# Patient Record
Sex: Female | Born: 1945 | Race: White | Hispanic: No | Marital: Married | Smoking: Never smoker
Health system: Southern US, Community
[De-identification: ages and names within clinical notes are randomized; demographics above are authoritative.]

## PROBLEM LIST (undated history)

## (undated) DIAGNOSIS — C439 Malignant melanoma of skin, unspecified: Secondary | ICD-10-CM

## (undated) DIAGNOSIS — J90 Pleural effusion, not elsewhere classified: Secondary | ICD-10-CM

## (undated) DIAGNOSIS — C541 Malignant neoplasm of endometrium: Secondary | ICD-10-CM

## (undated) DIAGNOSIS — E611 Iron deficiency: Secondary | ICD-10-CM

## (undated) DIAGNOSIS — C50919 Malignant neoplasm of unspecified site of unspecified female breast: Secondary | ICD-10-CM

## (undated) DIAGNOSIS — Z803 Family history of malignant neoplasm of breast: Secondary | ICD-10-CM

## (undated) DIAGNOSIS — Z923 Personal history of irradiation: Secondary | ICD-10-CM

## (undated) DIAGNOSIS — Z808 Family history of malignant neoplasm of other organs or systems: Secondary | ICD-10-CM

## (undated) DIAGNOSIS — M1990 Unspecified osteoarthritis, unspecified site: Secondary | ICD-10-CM

## (undated) DIAGNOSIS — C569 Malignant neoplasm of unspecified ovary: Secondary | ICD-10-CM

## (undated) DIAGNOSIS — Z800 Family history of malignant neoplasm of digestive organs: Secondary | ICD-10-CM

## (undated) DIAGNOSIS — E785 Hyperlipidemia, unspecified: Secondary | ICD-10-CM

## (undated) HISTORY — DX: Malignant neoplasm of unspecified site of unspecified female breast: C50.919

## (undated) HISTORY — DX: Pleural effusion, not elsewhere classified: J90

## (undated) HISTORY — DX: Iron deficiency: E61.1

## (undated) HISTORY — DX: Unspecified osteoarthritis, unspecified site: M19.90

## (undated) HISTORY — DX: Hyperlipidemia, unspecified: E78.5

## (undated) HISTORY — DX: Malignant neoplasm of endometrium: C54.1

## (undated) HISTORY — DX: Family history of malignant neoplasm of other organs or systems: Z80.8

## (undated) HISTORY — PX: CATARACT EXTRACTION: SUR2

## (undated) HISTORY — DX: Personal history of irradiation: Z92.3

## (undated) HISTORY — DX: Malignant neoplasm of unspecified ovary: C56.9

## (undated) HISTORY — DX: Family history of malignant neoplasm of digestive organs: Z80.0

## (undated) HISTORY — PX: APPENDECTOMY: SHX54

## (undated) HISTORY — DX: Family history of malignant neoplasm of breast: Z80.3

## (undated) HISTORY — PX: OTHER SURGICAL HISTORY: SHX169

---

## 1995-05-17 HISTORY — PX: MASTECTOMY: SHX3

## 1998-05-09 ENCOUNTER — Emergency Department (HOSPITAL_COMMUNITY): Admission: EM | Admit: 1998-05-09 | Discharge: 1998-05-09 | Payer: Self-pay | Admitting: Emergency Medicine

## 1999-12-22 ENCOUNTER — Ambulatory Visit (HOSPITAL_COMMUNITY): Admission: RE | Admit: 1999-12-22 | Discharge: 1999-12-22 | Payer: Self-pay | Admitting: Family Medicine

## 1999-12-22 ENCOUNTER — Encounter: Payer: Self-pay | Admitting: Family Medicine

## 2000-04-10 ENCOUNTER — Encounter: Admission: RE | Admit: 2000-04-10 | Discharge: 2000-04-10 | Payer: Self-pay | Admitting: General Surgery

## 2000-04-10 ENCOUNTER — Encounter: Payer: Self-pay | Admitting: General Surgery

## 2000-05-01 ENCOUNTER — Encounter: Admission: RE | Admit: 2000-05-01 | Discharge: 2000-05-01 | Payer: Self-pay | Admitting: General Surgery

## 2000-05-01 ENCOUNTER — Encounter: Payer: Self-pay | Admitting: General Surgery

## 2000-05-15 ENCOUNTER — Encounter: Payer: Self-pay | Admitting: General Surgery

## 2000-05-15 ENCOUNTER — Encounter: Admission: RE | Admit: 2000-05-15 | Discharge: 2000-05-15 | Payer: Self-pay | Admitting: General Surgery

## 2008-05-16 DIAGNOSIS — C439 Malignant melanoma of skin, unspecified: Secondary | ICD-10-CM

## 2008-05-16 HISTORY — DX: Malignant melanoma of skin, unspecified: C43.9

## 2008-10-07 ENCOUNTER — Emergency Department (HOSPITAL_COMMUNITY): Admission: EM | Admit: 2008-10-07 | Discharge: 2008-10-07 | Payer: Self-pay | Admitting: Emergency Medicine

## 2008-10-07 ENCOUNTER — Emergency Department (HOSPITAL_COMMUNITY): Admission: EM | Admit: 2008-10-07 | Discharge: 2008-10-07 | Payer: Self-pay | Admitting: Family Medicine

## 2008-10-10 ENCOUNTER — Ambulatory Visit: Admission: RE | Admit: 2008-10-10 | Discharge: 2008-10-10 | Payer: Self-pay | Admitting: Gynecology

## 2008-10-10 ENCOUNTER — Encounter (INDEPENDENT_AMBULATORY_CARE_PROVIDER_SITE_OTHER): Payer: Self-pay | Admitting: Interventional Radiology

## 2008-10-10 ENCOUNTER — Encounter: Payer: Self-pay | Admitting: Gynecology

## 2008-10-10 ENCOUNTER — Ambulatory Visit (HOSPITAL_COMMUNITY): Admission: RE | Admit: 2008-10-10 | Discharge: 2008-10-10 | Payer: Self-pay | Admitting: Gynecology

## 2008-10-14 ENCOUNTER — Inpatient Hospital Stay (HOSPITAL_COMMUNITY): Admission: RE | Admit: 2008-10-14 | Discharge: 2008-10-17 | Payer: Self-pay | Admitting: Obstetrics and Gynecology

## 2008-10-14 ENCOUNTER — Encounter: Payer: Self-pay | Admitting: Gynecologic Oncology

## 2008-10-14 ENCOUNTER — Encounter (INDEPENDENT_AMBULATORY_CARE_PROVIDER_SITE_OTHER): Payer: Self-pay | Admitting: Interventional Radiology

## 2008-10-14 DIAGNOSIS — C541 Malignant neoplasm of endometrium: Secondary | ICD-10-CM

## 2008-10-14 HISTORY — PX: ABDOMINAL HYSTERECTOMY: SHX81

## 2008-10-14 HISTORY — DX: Malignant neoplasm of endometrium: C54.1

## 2008-10-17 ENCOUNTER — Ambulatory Visit: Payer: Self-pay | Admitting: Oncology

## 2008-10-29 ENCOUNTER — Ambulatory Visit: Admission: RE | Admit: 2008-10-29 | Discharge: 2008-10-29 | Payer: Self-pay | Admitting: Gynecology

## 2008-11-12 LAB — CMP (CANCER CENTER ONLY)
ALT(SGPT): 22 U/L (ref 10–47)
AST: 20 U/L (ref 11–38)
Albumin: 3.2 g/dL — ABNORMAL LOW (ref 3.3–5.5)
Alkaline Phosphatase: 58 U/L (ref 26–84)
BUN, Bld: 11 mg/dL (ref 7–22)
CO2: 29 mEq/L (ref 18–33)
Calcium: 9.2 mg/dL (ref 8.0–10.3)
Chloride: 98 mEq/L (ref 98–108)
Creat: 0.6 mg/dl (ref 0.6–1.2)
Glucose, Bld: 139 mg/dL — ABNORMAL HIGH (ref 73–118)
Potassium: 4.4 mEq/L (ref 3.3–4.7)
Sodium: 145 mEq/L (ref 128–145)
Total Bilirubin: 0.7 mg/dl (ref 0.20–1.60)
Total Protein: 7 g/dL (ref 6.4–8.1)

## 2008-11-12 LAB — CBC WITH DIFFERENTIAL (CANCER CENTER ONLY)
BASO#: 0 10*3/uL (ref 0.0–0.2)
BASO%: 0.4 % (ref 0.0–2.0)
EOS%: 3.8 % (ref 0.0–7.0)
Eosinophils Absolute: 0.3 10*3/uL (ref 0.0–0.5)
HCT: 33 % — ABNORMAL LOW (ref 34.8–46.6)
HGB: 11.1 g/dL — ABNORMAL LOW (ref 11.6–15.9)
LYMPH#: 1.8 10*3/uL (ref 0.9–3.3)
LYMPH%: 27.6 % (ref 14.0–48.0)
MCH: 24.9 pg — ABNORMAL LOW (ref 26.0–34.0)
MCHC: 33.5 g/dL (ref 32.0–36.0)
MCV: 74 fL — ABNORMAL LOW (ref 81–101)
MONO#: 0.4 10*3/uL (ref 0.1–0.9)
MONO%: 6.5 % (ref 0.0–13.0)
NEUT#: 4.1 10*3/uL (ref 1.5–6.5)
NEUT%: 61.7 % (ref 39.6–80.0)
Platelets: 294 10*3/uL (ref 145–400)
RBC: 4.44 10*6/uL (ref 3.70–5.32)
RDW: 15.1 % — ABNORMAL HIGH (ref 10.5–14.6)
WBC: 6.6 10*3/uL (ref 3.9–10.0)

## 2008-11-13 LAB — FOLATE: Folate: 8.9 ng/mL

## 2008-11-13 LAB — CA 125: CA 125: 42 U/mL — ABNORMAL HIGH (ref 0.0–30.2)

## 2008-11-13 LAB — IRON AND TIBC
%SAT: 6 % — ABNORMAL LOW (ref 20–55)
Iron: 24 ug/dL — ABNORMAL LOW (ref 42–145)
TIBC: 371 ug/dL (ref 250–470)
UIBC: 347 ug/dL

## 2008-11-13 LAB — CEA: CEA: 0.5 ng/mL (ref 0.0–5.0)

## 2008-11-13 LAB — LACTATE DEHYDROGENASE: LDH: 133 U/L (ref 94–250)

## 2008-11-13 LAB — VITAMIN B12: Vitamin B-12: 350 pg/mL (ref 211–911)

## 2008-11-13 LAB — FERRITIN: Ferritin: 21 ng/mL (ref 10–291)

## 2008-11-20 ENCOUNTER — Ambulatory Visit: Admission: RE | Admit: 2008-11-20 | Discharge: 2008-11-20 | Payer: Self-pay | Admitting: Gynecologic Oncology

## 2008-11-21 ENCOUNTER — Ambulatory Visit: Payer: Self-pay | Admitting: Genetic Counselor

## 2008-11-26 ENCOUNTER — Ambulatory Visit (HOSPITAL_COMMUNITY): Admission: RE | Admit: 2008-11-26 | Discharge: 2008-11-26 | Payer: Self-pay | Admitting: Oncology

## 2008-12-01 ENCOUNTER — Ambulatory Visit: Payer: Self-pay | Admitting: Oncology

## 2008-12-02 LAB — CBC WITH DIFFERENTIAL (CANCER CENTER ONLY)
BASO#: 0 10*3/uL (ref 0.0–0.2)
BASO%: 0.4 % (ref 0.0–2.0)
EOS%: 0.9 % (ref 0.0–7.0)
Eosinophils Absolute: 0.1 10*3/uL (ref 0.0–0.5)
HCT: 38.7 % (ref 34.8–46.6)
HGB: 13.2 g/dL (ref 11.6–15.9)
LYMPH#: 0.8 10*3/uL — ABNORMAL LOW (ref 0.9–3.3)
LYMPH%: 10.2 % — ABNORMAL LOW (ref 14.0–48.0)
MCH: 25.4 pg — ABNORMAL LOW (ref 26.0–34.0)
MCHC: 34 g/dL (ref 32.0–36.0)
MCV: 75 fL — ABNORMAL LOW (ref 81–101)
MONO#: 0.1 10*3/uL (ref 0.1–0.9)
MONO%: 0.9 % (ref 0.0–13.0)
NEUT#: 7.2 10*3/uL — ABNORMAL HIGH (ref 1.5–6.5)
NEUT%: 87.6 % — ABNORMAL HIGH (ref 39.6–80.0)
Platelets: 247 10*3/uL (ref 145–400)
RBC: 5.17 10*6/uL (ref 3.70–5.32)
RDW: 16.1 % — ABNORMAL HIGH (ref 10.5–14.6)
WBC: 8.2 10*3/uL (ref 3.9–10.0)

## 2008-12-02 LAB — BASIC METABOLIC PANEL - CANCER CENTER ONLY
BUN, Bld: 13 mg/dL (ref 7–22)
CO2: 29 mEq/L (ref 18–33)
Calcium: 9.7 mg/dL (ref 8.0–10.3)
Chloride: 100 mEq/L (ref 98–108)
Creat: 0.6 mg/dl (ref 0.6–1.2)
Glucose, Bld: 306 mg/dL — ABNORMAL HIGH (ref 73–118)
Potassium: 5 mEq/L — ABNORMAL HIGH (ref 3.3–4.7)
Sodium: 142 mEq/L (ref 128–145)

## 2008-12-09 LAB — MANUAL DIFFERENTIAL (CHCC SATELLITE)
ALC: 1.8 10*3/uL (ref 0.6–2.2)
ANC (CHCC HP manual diff): 2.1 10*3/uL (ref 1.5–6.7)
BASO: 1 % (ref 0–2)
Band Neutrophils: 13 % — ABNORMAL HIGH (ref 0–10)
Eos: 4 % (ref 0–7)
LYMPH: 34 % (ref 14–48)
MONO: 18 % — ABNORMAL HIGH (ref 0–13)
PLT EST ~~LOC~~: ADEQUATE
Platelet Morphology: NORMAL
SEG: 29 % — ABNORMAL LOW (ref 40–75)
Variant Lymph: 1 % — ABNORMAL HIGH (ref 0–0)

## 2008-12-09 LAB — BASIC METABOLIC PANEL
BUN: 14 mg/dL (ref 6–23)
CO2: 28 mEq/L (ref 19–32)
Calcium: 9.9 mg/dL (ref 8.4–10.5)
Chloride: 101 mEq/L (ref 96–112)
Creatinine, Ser: 0.65 mg/dL (ref 0.40–1.20)
Glucose, Bld: 121 mg/dL — ABNORMAL HIGH (ref 70–99)
Potassium: 4.5 mEq/L (ref 3.5–5.3)
Sodium: 140 mEq/L (ref 135–145)

## 2008-12-09 LAB — CBC WITH DIFFERENTIAL (CANCER CENTER ONLY)
HCT: 37.5 % (ref 34.8–46.6)
HGB: 12.6 g/dL (ref 11.6–15.9)
MCH: 25.2 pg — ABNORMAL LOW (ref 26.0–34.0)
MCHC: 33.6 g/dL (ref 32.0–36.0)
MCV: 75 fL — ABNORMAL LOW (ref 81–101)
Platelets: 193 10*3/uL (ref 145–400)
RBC: 4.99 10*6/uL (ref 3.70–5.32)
RDW: 16.4 % — ABNORMAL HIGH (ref 10.5–14.6)
WBC: 5.1 10*3/uL (ref 3.9–10.0)

## 2008-12-09 LAB — TECHNOLOGIST REVIEW CHCC SATELLITE

## 2008-12-23 LAB — CMP (CANCER CENTER ONLY)
ALT(SGPT): 20 U/L (ref 10–47)
AST: 25 U/L (ref 11–38)
Albumin: 3.2 g/dL — ABNORMAL LOW (ref 3.3–5.5)
Alkaline Phosphatase: 64 U/L (ref 26–84)
BUN, Bld: 15 mg/dL (ref 7–22)
CO2: 29 mEq/L (ref 18–33)
Calcium: 8.8 mg/dL (ref 8.0–10.3)
Chloride: 99 mEq/L (ref 98–108)
Creat: 0.6 mg/dl (ref 0.6–1.2)
Glucose, Bld: 349 mg/dL — ABNORMAL HIGH (ref 73–118)
Potassium: 4.5 mEq/L (ref 3.3–4.7)
Sodium: 143 mEq/L (ref 128–145)
Total Bilirubin: 0.7 mg/dl (ref 0.20–1.60)
Total Protein: 7.1 g/dL (ref 6.4–8.1)

## 2008-12-23 LAB — CBC WITH DIFFERENTIAL (CANCER CENTER ONLY)
BASO#: 0.1 10*3/uL (ref 0.0–0.2)
BASO%: 0.5 % (ref 0.0–2.0)
EOS%: 1.2 % (ref 0.0–7.0)
Eosinophils Absolute: 0.2 10*3/uL (ref 0.0–0.5)
HCT: 37.2 % (ref 34.8–46.6)
HGB: 12.5 g/dL (ref 11.6–15.9)
LYMPH#: 0.8 10*3/uL — ABNORMAL LOW (ref 0.9–3.3)
LYMPH%: 4.9 % — ABNORMAL LOW (ref 14.0–48.0)
MCH: 25.8 pg — ABNORMAL LOW (ref 26.0–34.0)
MCHC: 33.7 g/dL (ref 32.0–36.0)
MCV: 76 fL — ABNORMAL LOW (ref 81–101)
MONO#: 0.2 10*3/uL (ref 0.1–0.9)
MONO%: 1.2 % (ref 0.0–13.0)
NEUT#: 14.5 10*3/uL — ABNORMAL HIGH (ref 1.5–6.5)
NEUT%: 92.2 % — ABNORMAL HIGH (ref 39.6–80.0)
Platelets: 208 10*3/uL (ref 145–400)
RBC: 4.87 10*6/uL (ref 3.70–5.32)
RDW: 17.2 % — ABNORMAL HIGH (ref 10.5–14.6)
WBC: 15.8 10*3/uL — ABNORMAL HIGH (ref 3.9–10.0)

## 2008-12-23 LAB — LIPID PANEL
Cholesterol: 153 mg/dL (ref 0–200)
HDL: 60 mg/dL (ref >39)
LDL Cholesterol: 75 mg/dL (ref 0–99)
Total CHOL/HDL Ratio: 2.6 Ratio
Triglycerides: 92 mg/dL (ref <150)
VLDL: 18 mg/dL (ref 0–40)

## 2008-12-26 ENCOUNTER — Ambulatory Visit: Payer: Self-pay | Admitting: Oncology

## 2008-12-30 LAB — CBC WITH DIFFERENTIAL (CANCER CENTER ONLY)
BASO#: 0 10*3/uL (ref 0.0–0.2)
BASO%: 0.5 % (ref 0.0–2.0)
EOS%: 1.4 % (ref 0.0–7.0)
Eosinophils Absolute: 0.1 10*3/uL (ref 0.0–0.5)
HCT: 35.3 % (ref 34.8–46.6)
HGB: 12 g/dL (ref 11.6–15.9)
LYMPH#: 1.2 10*3/uL (ref 0.9–3.3)
LYMPH%: 18.5 % (ref 14.0–48.0)
MCH: 25.9 pg — ABNORMAL LOW (ref 26.0–34.0)
MCHC: 33.9 g/dL (ref 32.0–36.0)
MCV: 76 fL — ABNORMAL LOW (ref 81–101)
MONO#: 0.3 10*3/uL (ref 0.1–0.9)
MONO%: 5.2 % (ref 0.0–13.0)
NEUT#: 4.9 10*3/uL (ref 1.5–6.5)
NEUT%: 74.4 % (ref 39.6–80.0)
Platelets: 152 10*3/uL (ref 145–400)
RBC: 4.62 10*6/uL (ref 3.70–5.32)
RDW: 17.3 % — ABNORMAL HIGH (ref 10.5–14.6)
WBC: 6.6 10*3/uL (ref 3.9–10.0)

## 2008-12-30 LAB — BASIC METABOLIC PANEL - CANCER CENTER ONLY
BUN, Bld: 12 mg/dL (ref 7–22)
CO2: 30 mEq/L (ref 18–33)
Calcium: 9.5 mg/dL (ref 8.0–10.3)
Chloride: 97 mEq/L — ABNORMAL LOW (ref 98–108)
Creat: 0.5 mg/dl — ABNORMAL LOW (ref 0.6–1.2)
Glucose, Bld: 177 mg/dL — ABNORMAL HIGH (ref 73–118)
Potassium: 4.7 mEq/L (ref 3.3–4.7)
Sodium: 143 mEq/L (ref 128–145)

## 2009-01-13 LAB — CBC WITH DIFFERENTIAL (CANCER CENTER ONLY)
BASO#: 0.1 10*3/uL (ref 0.0–0.2)
BASO%: 0.5 % (ref 0.0–2.0)
EOS%: 1.1 % (ref 0.0–7.0)
Eosinophils Absolute: 0.2 10*3/uL (ref 0.0–0.5)
HCT: 39.1 % (ref 34.8–46.6)
HGB: 12.7 g/dL (ref 11.6–15.9)
LYMPH#: 0.7 10*3/uL — ABNORMAL LOW (ref 0.9–3.3)
LYMPH%: 5.2 % — ABNORMAL LOW (ref 14.0–48.0)
MCH: 26.7 pg (ref 26.0–34.0)
MCHC: 32.4 g/dL (ref 32.0–36.0)
MCV: 82 fL (ref 81–101)
MONO#: 0.2 10*3/uL (ref 0.1–0.9)
MONO%: 1.2 % (ref 0.0–13.0)
NEUT#: 12.8 10*3/uL — ABNORMAL HIGH (ref 1.5–6.5)
NEUT%: 92 % — ABNORMAL HIGH (ref 39.6–80.0)
Platelets: 161 10*3/uL (ref 145–400)
RBC: 4.75 10*6/uL (ref 3.70–5.32)
RDW: 19.1 % — ABNORMAL HIGH (ref 10.5–14.6)
WBC: 13.9 10*3/uL — ABNORMAL HIGH (ref 3.9–10.0)

## 2009-01-13 LAB — CMP (CANCER CENTER ONLY)
ALT(SGPT): 29 U/L (ref 10–47)
AST: 26 U/L (ref 11–38)
Albumin: 3.5 g/dL (ref 3.3–5.5)
Alkaline Phosphatase: 83 U/L (ref 26–84)
BUN, Bld: 14 mg/dL (ref 7–22)
CO2: 28 mEq/L (ref 18–33)
Calcium: 9.1 mg/dL (ref 8.0–10.3)
Chloride: 97 mEq/L — ABNORMAL LOW (ref 98–108)
Creat: 0.6 mg/dl (ref 0.6–1.2)
Glucose, Bld: 396 mg/dL — ABNORMAL HIGH (ref 73–118)
Potassium: 4.6 mEq/L (ref 3.3–4.7)
Sodium: 142 mEq/L (ref 128–145)
Total Bilirubin: 0.8 mg/dl (ref 0.20–1.60)
Total Protein: 7.1 g/dL (ref 6.4–8.1)

## 2009-01-16 ENCOUNTER — Ambulatory Visit (HOSPITAL_COMMUNITY): Admission: RE | Admit: 2009-01-16 | Discharge: 2009-01-16 | Payer: Self-pay | Admitting: General Surgery

## 2009-01-21 LAB — MANUAL DIFFERENTIAL (CHCC SATELLITE)
ALC: 2.1 10*3/uL (ref 0.6–2.2)
ANC (CHCC HP manual diff): 2.9 10*3/uL (ref 1.5–6.7)
Band Neutrophils: 19 % — ABNORMAL HIGH (ref 0–10)
LYMPH: 36 % (ref 14–48)
MONO: 11 % (ref 0–13)
PLT EST ~~LOC~~: ADEQUATE
Platelet Morphology: NORMAL
SEG: 33 % — ABNORMAL LOW (ref 40–75)
Variant Lymph: 1 % — ABNORMAL HIGH (ref 0–0)
nRBC: 1 % — ABNORMAL HIGH (ref 0–0)

## 2009-01-21 LAB — CMP (CANCER CENTER ONLY)
ALT(SGPT): 29 U/L (ref 10–47)
AST: 33 U/L (ref 11–38)
Albumin: 3.5 g/dL (ref 3.3–5.5)
Alkaline Phosphatase: 82 U/L (ref 26–84)
BUN, Bld: 14 mg/dL (ref 7–22)
CO2: 32 mEq/L (ref 18–33)
Calcium: 8.9 mg/dL (ref 8.0–10.3)
Chloride: 98 mEq/L (ref 98–108)
Creat: 0.7 mg/dl (ref 0.6–1.2)
Glucose, Bld: 146 mg/dL — ABNORMAL HIGH (ref 73–118)
Potassium: 4.2 mEq/L (ref 3.3–4.7)
Sodium: 143 mEq/L (ref 128–145)
Total Bilirubin: 0.7 mg/dl (ref 0.20–1.60)
Total Protein: 6.7 g/dL (ref 6.4–8.1)

## 2009-01-21 LAB — CBC WITH DIFFERENTIAL (CANCER CENTER ONLY)
HCT: 35 % (ref 34.8–46.6)
HGB: 11.6 g/dL (ref 11.6–15.9)
MCH: 27 pg (ref 26.0–34.0)
MCHC: 33 g/dL (ref 32.0–36.0)
MCV: 82 fL (ref 81–101)
Platelets: 149 10*3/uL (ref 145–400)
RBC: 4.27 10*6/uL (ref 3.70–5.32)
RDW: 20.3 % — ABNORMAL HIGH (ref 10.5–14.6)
WBC: 5.6 10*3/uL (ref 3.9–10.0)

## 2009-01-28 ENCOUNTER — Ambulatory Visit: Payer: Self-pay | Admitting: Oncology

## 2009-02-03 LAB — CBC WITH DIFFERENTIAL (CANCER CENTER ONLY)
BASO#: 0.1 10*3/uL (ref 0.0–0.2)
BASO%: 0.4 % (ref 0.0–2.0)
EOS%: 0.9 % (ref 0.0–7.0)
Eosinophils Absolute: 0.1 10*3/uL (ref 0.0–0.5)
HCT: 37.7 % (ref 34.8–46.6)
HGB: 12.4 g/dL (ref 11.6–15.9)
LYMPH#: 0.8 10*3/uL — ABNORMAL LOW (ref 0.9–3.3)
LYMPH%: 6.5 % — ABNORMAL LOW (ref 14.0–48.0)
MCH: 27.9 pg (ref 26.0–34.0)
MCHC: 32.8 g/dL (ref 32.0–36.0)
MCV: 85 fL (ref 81–101)
MONO#: 0.2 10*3/uL (ref 0.1–0.9)
MONO%: 1.8 % (ref 0.0–13.0)
NEUT#: 10.7 10*3/uL — ABNORMAL HIGH (ref 1.5–6.5)
NEUT%: 90.4 % — ABNORMAL HIGH (ref 39.6–80.0)
Platelets: 187 10*3/uL (ref 145–400)
RBC: 4.42 10*6/uL (ref 3.70–5.32)
RDW: 23.1 % — ABNORMAL HIGH (ref 10.5–14.6)
WBC: 11.9 10*3/uL — ABNORMAL HIGH (ref 3.9–10.0)

## 2009-02-03 LAB — CMP (CANCER CENTER ONLY)
ALT(SGPT): 22 U/L (ref 10–47)
AST: 27 U/L (ref 11–38)
Albumin: 3.5 g/dL (ref 3.3–5.5)
Alkaline Phosphatase: 76 U/L (ref 26–84)
BUN, Bld: 11 mg/dL (ref 7–22)
CO2: 30 mEq/L (ref 18–33)
Calcium: 9.1 mg/dL (ref 8.0–10.3)
Chloride: 101 mEq/L (ref 98–108)
Creat: 0.6 mg/dl (ref 0.6–1.2)
Glucose, Bld: 323 mg/dL — ABNORMAL HIGH (ref 73–118)
Potassium: 4.8 mEq/L — ABNORMAL HIGH (ref 3.3–4.7)
Sodium: 146 mEq/L — ABNORMAL HIGH (ref 128–145)
Total Bilirubin: 0.8 mg/dl (ref 0.20–1.60)
Total Protein: 7.1 g/dL (ref 6.4–8.1)

## 2009-02-11 LAB — MANUAL DIFFERENTIAL (CHCC SATELLITE)
ALC: 1.4 10*3/uL (ref 0.6–2.2)
ANC (CHCC HP manual diff): 3.2 10*3/uL (ref 1.5–6.7)
Band Neutrophils: 16 % — ABNORMAL HIGH (ref 0–10)
Eos: 2 % (ref 0–7)
LYMPH: 24 % (ref 14–48)
MONO: 20 % — ABNORMAL HIGH (ref 0–13)
PLT EST ~~LOC~~: ADEQUATE
Platelet Morphology: NORMAL
RBC Comments: NORMAL
SEG: 38 % — ABNORMAL LOW (ref 40–75)

## 2009-02-11 LAB — TECHNOLOGIST REVIEW CHCC SATELLITE

## 2009-02-11 LAB — BASIC METABOLIC PANEL - CANCER CENTER ONLY
BUN, Bld: 12 mg/dL (ref 7–22)
CO2: 32 mEq/L (ref 18–33)
Calcium: 9.1 mg/dL (ref 8.0–10.3)
Chloride: 100 mEq/L (ref 98–108)
Creat: 0.5 mg/dl — ABNORMAL LOW (ref 0.6–1.2)
Glucose, Bld: 139 mg/dL — ABNORMAL HIGH (ref 73–118)
Potassium: 4.6 mEq/L (ref 3.3–4.7)
Sodium: 146 mEq/L — ABNORMAL HIGH (ref 128–145)

## 2009-02-11 LAB — CBC WITH DIFFERENTIAL (CANCER CENTER ONLY)
HCT: 34.4 % — ABNORMAL LOW (ref 34.8–46.6)
HGB: 11.5 g/dL — ABNORMAL LOW (ref 11.6–15.9)
MCH: 28.6 pg (ref 26.0–34.0)
MCHC: 33.4 g/dL (ref 32.0–36.0)
MCV: 86 fL (ref 81–101)
Platelets: 164 10*3/uL (ref 145–400)
RBC: 4.01 10*6/uL (ref 3.70–5.32)
RDW: 22.9 % — ABNORMAL HIGH (ref 10.5–14.6)
WBC: 6 10*3/uL (ref 3.9–10.0)

## 2009-02-23 ENCOUNTER — Encounter (INDEPENDENT_AMBULATORY_CARE_PROVIDER_SITE_OTHER): Payer: Self-pay | Admitting: General Surgery

## 2009-02-23 ENCOUNTER — Ambulatory Visit (HOSPITAL_COMMUNITY): Admission: RE | Admit: 2009-02-23 | Discharge: 2009-02-23 | Payer: Self-pay | Admitting: General Surgery

## 2009-03-02 ENCOUNTER — Ambulatory Visit: Payer: Self-pay | Admitting: Oncology

## 2009-03-03 LAB — CMP (CANCER CENTER ONLY)
ALT(SGPT): 21 U/L (ref 10–47)
AST: 25 U/L (ref 11–38)
Albumin: 3.5 g/dL (ref 3.3–5.5)
Alkaline Phosphatase: 64 U/L (ref 26–84)
BUN, Bld: 14 mg/dL (ref 7–22)
CO2: 28 mEq/L (ref 18–33)
Calcium: 8.9 mg/dL (ref 8.0–10.3)
Chloride: 101 mEq/L (ref 98–108)
Creat: 0.4 mg/dl — ABNORMAL LOW (ref 0.6–1.2)
Glucose, Bld: 365 mg/dL — ABNORMAL HIGH (ref 73–118)
Potassium: 4.6 mEq/L (ref 3.3–4.7)
Sodium: 142 mEq/L (ref 128–145)
Total Bilirubin: 1 mg/dl (ref 0.20–1.60)
Total Protein: 7.2 g/dL (ref 6.4–8.1)

## 2009-03-03 LAB — CBC WITH DIFFERENTIAL (CANCER CENTER ONLY)
BASO#: 0 10*3/uL (ref 0.0–0.2)
BASO%: 0.3 % (ref 0.0–2.0)
EOS%: 0.9 % (ref 0.0–7.0)
Eosinophils Absolute: 0.1 10*3/uL (ref 0.0–0.5)
HCT: 39.1 % (ref 34.8–46.6)
HGB: 13 g/dL (ref 11.6–15.9)
LYMPH#: 0.7 10*3/uL — ABNORMAL LOW (ref 0.9–3.3)
LYMPH%: 7.2 % — ABNORMAL LOW (ref 14.0–48.0)
MCH: 30.7 pg (ref 26.0–34.0)
MCHC: 33.3 g/dL (ref 32.0–36.0)
MCV: 92 fL (ref 81–101)
MONO#: 0.1 10*3/uL (ref 0.1–0.9)
MONO%: 1.1 % (ref 0.0–13.0)
NEUT#: 8.6 10*3/uL — ABNORMAL HIGH (ref 1.5–6.5)
NEUT%: 90.5 % — ABNORMAL HIGH (ref 39.6–80.0)
Platelets: 217 10*3/uL (ref 145–400)
RBC: 4.24 10*6/uL (ref 3.70–5.32)
RDW: 18.4 % — ABNORMAL HIGH (ref 10.5–14.6)
WBC: 9.5 10*3/uL (ref 3.9–10.0)

## 2009-03-05 ENCOUNTER — Ambulatory Visit: Admission: RE | Admit: 2009-03-05 | Discharge: 2009-03-05 | Payer: Self-pay | Admitting: Gynecologic Oncology

## 2009-03-09 LAB — CBC WITH DIFFERENTIAL (CANCER CENTER ONLY)
BASO#: 0.1 10*3/uL (ref 0.0–0.2)
BASO%: 0.5 % (ref 0.0–2.0)
EOS%: 1.1 % (ref 0.0–7.0)
Eosinophils Absolute: 0.1 10*3/uL (ref 0.0–0.5)
HCT: 41.6 % (ref 34.8–46.6)
HGB: 13.7 g/dL (ref 11.6–15.9)
LYMPH#: 0.7 10*3/uL — ABNORMAL LOW (ref 0.9–3.3)
LYMPH%: 6.2 % — ABNORMAL LOW (ref 14.0–48.0)
MCH: 30.9 pg (ref 26.0–34.0)
MCHC: 33.1 g/dL (ref 32.0–36.0)
MCV: 93 fL (ref 81–101)
MONO#: 0.1 10*3/uL (ref 0.1–0.9)
MONO%: 0.9 % (ref 0.0–13.0)
NEUT#: 9.7 10*3/uL — ABNORMAL HIGH (ref 1.5–6.5)
NEUT%: 91.3 % — ABNORMAL HIGH (ref 39.6–80.0)
Platelets: 189 10*3/uL (ref 145–400)
RBC: 4.45 10*6/uL (ref 3.70–5.32)
RDW: 16.4 % — ABNORMAL HIGH (ref 10.5–14.6)
WBC: 10.6 10*3/uL — ABNORMAL HIGH (ref 3.9–10.0)

## 2009-03-09 LAB — CMP (CANCER CENTER ONLY)
ALT(SGPT): 25 U/L (ref 10–47)
AST: 23 U/L (ref 11–38)
Albumin: 3.6 g/dL (ref 3.3–5.5)
Alkaline Phosphatase: 63 U/L (ref 26–84)
BUN, Bld: 15 mg/dL (ref 7–22)
CO2: 29 mEq/L (ref 18–33)
Calcium: 9.7 mg/dL (ref 8.0–10.3)
Chloride: 101 mEq/L (ref 98–108)
Creat: 0.6 mg/dl (ref 0.6–1.2)
Glucose, Bld: 352 mg/dL — ABNORMAL HIGH (ref 73–118)
Potassium: 4.8 mEq/L — ABNORMAL HIGH (ref 3.3–4.7)
Sodium: 144 mEq/L (ref 128–145)
Total Bilirubin: 1.1 mg/dl (ref 0.20–1.60)
Total Protein: 7.2 g/dL (ref 6.4–8.1)

## 2009-03-09 LAB — MORPHOLOGY - CHCC SATELLITE
PLT EST ~~LOC~~: ADEQUATE
Platelet Morphology: NORMAL

## 2009-03-17 LAB — MANUAL DIFFERENTIAL (CHCC SATELLITE)
ALC: 1.5 10*3/uL (ref 0.6–2.2)
ANC (CHCC HP manual diff): 2.1 10*3/uL (ref 1.5–6.7)
BASO: 2 % (ref 0–2)
Band Neutrophils: 16 % — ABNORMAL HIGH (ref 0–10)
Eos: 2 % (ref 0–7)
LYMPH: 35 % (ref 14–48)
MONO: 12 % (ref 0–13)
Metamyelocytes: 1 % — ABNORMAL HIGH (ref 0–0)
PLT EST ~~LOC~~: DECREASED
Platelet Morphology: NORMAL
SEG: 32 % — ABNORMAL LOW (ref 40–75)

## 2009-03-17 LAB — CBC WITH DIFFERENTIAL (CANCER CENTER ONLY)
HCT: 35.1 % (ref 34.8–46.6)
HGB: 11.6 g/dL (ref 11.6–15.9)
MCH: 31.2 pg (ref 26.0–34.0)
MCHC: 33 g/dL (ref 32.0–36.0)
MCV: 95 fL (ref 81–101)
Platelets: 118 10*3/uL — ABNORMAL LOW (ref 145–400)
RBC: 3.71 10*6/uL (ref 3.70–5.32)
RDW: 14 % (ref 10.5–14.6)
WBC: 4.2 10*3/uL (ref 3.9–10.0)

## 2009-03-17 LAB — BASIC METABOLIC PANEL - CANCER CENTER ONLY
BUN, Bld: 12 mg/dL (ref 7–22)
CO2: 33 mEq/L (ref 18–33)
Calcium: 8.9 mg/dL (ref 8.0–10.3)
Chloride: 100 mEq/L (ref 98–108)
Creat: 0.6 mg/dl (ref 0.6–1.2)
Glucose, Bld: 168 mg/dL — ABNORMAL HIGH (ref 73–118)
Potassium: 4.6 mEq/L (ref 3.3–4.7)
Sodium: 143 mEq/L (ref 128–145)

## 2009-03-30 ENCOUNTER — Ambulatory Visit: Payer: Self-pay | Admitting: Oncology

## 2009-03-30 LAB — CBC WITH DIFFERENTIAL (CANCER CENTER ONLY)
BASO#: 0.1 10*3/uL (ref 0.0–0.2)
BASO%: 0.4 % (ref 0.0–2.0)
EOS%: 1.1 % (ref 0.0–7.0)
Eosinophils Absolute: 0.1 10*3/uL (ref 0.0–0.5)
HCT: 37.6 % (ref 34.8–46.6)
HGB: 12.4 g/dL (ref 11.6–15.9)
LYMPH#: 0.8 10*3/uL — ABNORMAL LOW (ref 0.9–3.3)
LYMPH%: 7.2 % — ABNORMAL LOW (ref 14.0–48.0)
MCH: 32.1 pg (ref 26.0–34.0)
MCHC: 33.1 g/dL (ref 32.0–36.0)
MCV: 97 fL (ref 81–101)
MONO#: 0.2 10*3/uL (ref 0.1–0.9)
MONO%: 1.7 % (ref 0.0–13.0)
NEUT#: 10.5 10*3/uL — ABNORMAL HIGH (ref 1.5–6.5)
NEUT%: 89.6 % — ABNORMAL HIGH (ref 39.6–80.0)
Platelets: 165 10*3/uL (ref 145–400)
RBC: 3.87 10*6/uL (ref 3.70–5.32)
RDW: 13.3 % (ref 10.5–14.6)
WBC: 11.7 10*3/uL — ABNORMAL HIGH (ref 3.9–10.0)

## 2009-03-30 LAB — CMP (CANCER CENTER ONLY)
ALT(SGPT): 27 U/L (ref 10–47)
AST: 27 U/L (ref 11–38)
Albumin: 3.5 g/dL (ref 3.3–5.5)
Alkaline Phosphatase: 70 U/L (ref 26–84)
BUN, Bld: 13 mg/dL (ref 7–22)
CO2: 29 mEq/L (ref 18–33)
Calcium: 9 mg/dL (ref 8.0–10.3)
Chloride: 98 mEq/L (ref 98–108)
Creat: 0.4 mg/dl — ABNORMAL LOW (ref 0.6–1.2)
Glucose, Bld: 337 mg/dL — ABNORMAL HIGH (ref 73–118)
Potassium: 4.8 mEq/L — ABNORMAL HIGH (ref 3.3–4.7)
Sodium: 142 mEq/L (ref 128–145)
Total Bilirubin: 0.8 mg/dl (ref 0.20–1.60)
Total Protein: 6.9 g/dL (ref 6.4–8.1)

## 2009-04-03 ENCOUNTER — Ambulatory Visit: Admission: RE | Admit: 2009-04-03 | Discharge: 2009-05-15 | Payer: Self-pay | Admitting: Radiation Oncology

## 2009-04-13 LAB — CBC WITH DIFFERENTIAL (CANCER CENTER ONLY)
BASO#: 0.1 10*3/uL (ref 0.0–0.2)
BASO%: 0.6 % (ref 0.0–2.0)
EOS%: 1.3 % (ref 0.0–7.0)
Eosinophils Absolute: 0.2 10*3/uL (ref 0.0–0.5)
HCT: 31.2 % — ABNORMAL LOW (ref 34.8–46.6)
HGB: 10.8 g/dL — ABNORMAL LOW (ref 11.6–15.9)
LYMPH#: 2.4 10*3/uL (ref 0.9–3.3)
LYMPH%: 19.4 % (ref 14.0–48.0)
MCH: 33.6 pg (ref 26.0–34.0)
MCHC: 34.5 g/dL (ref 32.0–36.0)
MCV: 97 fL (ref 81–101)
MONO#: 1.1 10*3/uL — ABNORMAL HIGH (ref 0.1–0.9)
MONO%: 9.4 % (ref 0.0–13.0)
NEUT#: 8.4 10*3/uL — ABNORMAL HIGH (ref 1.5–6.5)
NEUT%: 69.3 % (ref 39.6–80.0)
Platelets: 95 10*3/uL — ABNORMAL LOW (ref 145–400)
RBC: 3.2 10*6/uL — ABNORMAL LOW (ref 3.70–5.32)
RDW: 12.4 % (ref 10.5–14.6)
WBC: 12.1 10*3/uL — ABNORMAL HIGH (ref 3.9–10.0)

## 2009-04-13 LAB — CMP (CANCER CENTER ONLY)
ALT(SGPT): 27 U/L (ref 10–47)
AST: 26 U/L (ref 11–38)
Albumin: 3.3 g/dL (ref 3.3–5.5)
Alkaline Phosphatase: 80 U/L (ref 26–84)
BUN, Bld: 14 mg/dL (ref 7–22)
CO2: 32 mEq/L (ref 18–33)
Calcium: 8.4 mg/dL (ref 8.0–10.3)
Chloride: 103 mEq/L (ref 98–108)
Creat: 0.5 mg/dl — ABNORMAL LOW (ref 0.6–1.2)
Glucose, Bld: 194 mg/dL — ABNORMAL HIGH (ref 73–118)
Potassium: 3.8 mEq/L (ref 3.3–4.7)
Sodium: 144 mEq/L (ref 128–145)
Total Bilirubin: 0.7 mg/dl (ref 0.20–1.60)
Total Protein: 6.2 g/dL — ABNORMAL LOW (ref 6.4–8.1)

## 2009-05-14 ENCOUNTER — Ambulatory Visit: Payer: Self-pay | Admitting: Oncology

## 2009-05-18 ENCOUNTER — Ambulatory Visit: Admission: RE | Admit: 2009-05-18 | Discharge: 2009-05-19 | Payer: Self-pay | Admitting: Radiation Oncology

## 2009-05-19 LAB — CBC WITH DIFFERENTIAL (CANCER CENTER ONLY)
BASO#: 0 10*3/uL (ref 0.0–0.2)
BASO%: 0.5 % (ref 0.0–2.0)
EOS%: 2.6 % (ref 0.0–7.0)
Eosinophils Absolute: 0.2 10*3/uL (ref 0.0–0.5)
HCT: 41.5 % (ref 34.8–46.6)
HGB: 13.8 g/dL (ref 11.6–15.9)
LYMPH#: 1.4 10*3/uL (ref 0.9–3.3)
LYMPH%: 24.9 % (ref 14.0–48.0)
MCH: 31.6 pg (ref 26.0–34.0)
MCHC: 33.3 g/dL (ref 32.0–36.0)
MCV: 95 fL (ref 81–101)
MONO#: 0.3 10*3/uL (ref 0.1–0.9)
MONO%: 5.7 % (ref 0.0–13.0)
NEUT#: 3.8 10*3/uL (ref 1.5–6.5)
NEUT%: 66.3 % (ref 39.6–80.0)
Platelets: 145 10*3/uL (ref 145–400)
RBC: 4.37 10*6/uL (ref 3.70–5.32)
RDW: 11.9 % (ref 10.5–14.6)
WBC: 5.7 10*3/uL (ref 3.9–10.0)

## 2009-05-19 LAB — CMP (CANCER CENTER ONLY)
ALT(SGPT): 25 U/L (ref 10–47)
AST: 29 U/L (ref 11–38)
Albumin: 3.6 g/dL (ref 3.3–5.5)
Alkaline Phosphatase: 57 U/L (ref 26–84)
BUN, Bld: 14 mg/dL (ref 7–22)
CO2: 32 mEq/L (ref 18–33)
Calcium: 9.1 mg/dL (ref 8.0–10.3)
Chloride: 98 mEq/L (ref 98–108)
Creat: 0.5 mg/dl — ABNORMAL LOW (ref 0.6–1.2)
Glucose, Bld: 156 mg/dL — ABNORMAL HIGH (ref 73–118)
Potassium: 4.1 mEq/L (ref 3.3–4.7)
Sodium: 145 mEq/L (ref 128–145)
Total Bilirubin: 1 mg/dl (ref 0.20–1.60)
Total Protein: 7.1 g/dL (ref 6.4–8.1)

## 2009-05-19 LAB — CA 125: CA 125: 35.8 U/mL — ABNORMAL HIGH (ref 0.0–30.2)

## 2009-05-28 ENCOUNTER — Ambulatory Visit: Admission: RE | Admit: 2009-05-28 | Discharge: 2009-05-28 | Payer: Self-pay | Admitting: Gynecologic Oncology

## 2009-06-03 ENCOUNTER — Ambulatory Visit (HOSPITAL_COMMUNITY): Admission: RE | Admit: 2009-06-03 | Discharge: 2009-06-03 | Payer: Self-pay | Admitting: Gynecologic Oncology

## 2009-08-20 ENCOUNTER — Other Ambulatory Visit: Admission: RE | Admit: 2009-08-20 | Discharge: 2009-08-20 | Payer: Self-pay | Admitting: Gynecologic Oncology

## 2009-08-20 ENCOUNTER — Ambulatory Visit: Admission: RE | Admit: 2009-08-20 | Discharge: 2009-08-20 | Payer: Self-pay | Admitting: Gynecologic Oncology

## 2009-10-01 ENCOUNTER — Ambulatory Visit: Payer: Self-pay | Admitting: Genetic Counselor

## 2009-11-02 ENCOUNTER — Ambulatory Visit: Payer: Self-pay | Admitting: Oncology

## 2009-11-04 ENCOUNTER — Ambulatory Visit: Payer: Self-pay | Admitting: Genetic Counselor

## 2009-11-17 LAB — CMP (CANCER CENTER ONLY)
ALT(SGPT): 23 U/L (ref 10–47)
AST: 24 U/L (ref 11–38)
Albumin: 4.2 g/dL (ref 3.3–5.5)
Alkaline Phosphatase: 60 U/L (ref 26–84)
BUN, Bld: 13 mg/dL (ref 7–22)
CO2: 28 mEq/L (ref 18–33)
Calcium: 9.7 mg/dL (ref 8.0–10.3)
Chloride: 103 mEq/L (ref 98–108)
Creat: 0.6 mg/dl (ref 0.6–1.2)
Glucose, Bld: 110 mg/dL (ref 73–118)
Potassium: 4.2 mEq/L (ref 3.3–4.7)
Sodium: 140 mEq/L (ref 128–145)
Total Bilirubin: 1 mg/dl (ref 0.20–1.60)
Total Protein: 7 g/dL (ref 6.4–8.1)

## 2009-11-17 LAB — CBC WITH DIFFERENTIAL (CANCER CENTER ONLY)
BASO#: 0 10*3/uL (ref 0.0–0.2)
BASO%: 0.6 % (ref 0.0–2.0)
EOS%: 2.1 % (ref 0.0–7.0)
Eosinophils Absolute: 0.1 10*3/uL (ref 0.0–0.5)
HCT: 39.8 % (ref 34.8–46.6)
HGB: 13.4 g/dL (ref 11.6–15.9)
LYMPH#: 1.5 10*3/uL (ref 0.9–3.3)
LYMPH%: 28.8 % (ref 14.0–48.0)
MCH: 30.1 pg (ref 26.0–34.0)
MCHC: 33.7 g/dL (ref 32.0–36.0)
MCV: 89 fL (ref 81–101)
MONO#: 0.4 10*3/uL (ref 0.1–0.9)
MONO%: 7.8 % (ref 0.0–13.0)
NEUT#: 3.2 10*3/uL (ref 1.5–6.5)
NEUT%: 60.7 % (ref 39.6–80.0)
Platelets: 158 10*3/uL (ref 145–400)
RBC: 4.47 10*6/uL (ref 3.70–5.32)
RDW: 13.3 % (ref 10.5–14.6)
WBC: 5.3 10*3/uL (ref 3.9–10.0)

## 2009-11-17 LAB — CA 125: CA 125: 13.3 U/mL (ref 0.0–30.2)

## 2009-11-27 ENCOUNTER — Ambulatory Visit (HOSPITAL_COMMUNITY): Admission: RE | Admit: 2009-11-27 | Discharge: 2009-11-27 | Payer: Self-pay | Admitting: Oncology

## 2010-01-04 ENCOUNTER — Other Ambulatory Visit: Admission: RE | Admit: 2010-01-04 | Discharge: 2010-01-04 | Payer: Self-pay | Admitting: Radiation Oncology

## 2010-01-04 ENCOUNTER — Ambulatory Visit
Admission: RE | Admit: 2010-01-04 | Discharge: 2010-01-04 | Payer: Self-pay | Source: Home / Self Care | Admitting: Radiation Oncology

## 2010-03-04 ENCOUNTER — Ambulatory Visit: Admission: RE | Admit: 2010-03-04 | Discharge: 2010-03-04 | Payer: Self-pay | Admitting: Gynecologic Oncology

## 2010-03-05 ENCOUNTER — Ambulatory Visit (HOSPITAL_COMMUNITY): Admission: RE | Admit: 2010-03-05 | Discharge: 2010-03-05 | Payer: Self-pay | Admitting: Gynecologic Oncology

## 2010-03-30 ENCOUNTER — Ambulatory Visit (HOSPITAL_COMMUNITY): Admission: RE | Admit: 2010-03-30 | Discharge: 2010-03-30 | Payer: Self-pay | Admitting: Gynecologic Oncology

## 2010-04-13 ENCOUNTER — Ambulatory Visit: Payer: Self-pay | Admitting: Thoracic Surgery

## 2010-04-15 DIAGNOSIS — J90 Pleural effusion, not elsewhere classified: Secondary | ICD-10-CM

## 2010-04-15 HISTORY — PX: KNEE ARTHROSCOPY: SHX127

## 2010-04-15 HISTORY — DX: Pleural effusion, not elsewhere classified: J90

## 2010-04-30 ENCOUNTER — Inpatient Hospital Stay (HOSPITAL_COMMUNITY)
Admission: RE | Admit: 2010-04-30 | Discharge: 2010-05-04 | Payer: Self-pay | Source: Home / Self Care | Attending: Thoracic Surgery | Admitting: Thoracic Surgery

## 2010-04-30 ENCOUNTER — Encounter: Payer: Self-pay | Admitting: Thoracic Surgery

## 2010-05-07 ENCOUNTER — Ambulatory Visit: Payer: Self-pay | Admitting: Oncology

## 2010-05-07 LAB — CBC WITH DIFFERENTIAL/PLATELET
BASO%: 0.3 % (ref 0.0–2.0)
Basophils Absolute: 0 10*3/uL (ref 0.0–0.1)
EOS%: 1.5 % (ref 0.0–7.0)
Eosinophils Absolute: 0.1 10*3/uL (ref 0.0–0.5)
HCT: 34 % — ABNORMAL LOW (ref 34.8–46.6)
HGB: 11.4 g/dL — ABNORMAL LOW (ref 11.6–15.9)
LYMPH%: 15.4 % (ref 14.0–49.7)
MCH: 29.5 pg (ref 25.1–34.0)
MCHC: 33.3 g/dL (ref 31.5–36.0)
MCV: 88.6 fL (ref 79.5–101.0)
MONO#: 0.6 10*3/uL (ref 0.1–0.9)
MONO%: 9 % (ref 0.0–14.0)
NEUT#: 4.8 10*3/uL (ref 1.5–6.5)
NEUT%: 73.8 % (ref 38.4–76.8)
Platelets: 331 10*3/uL (ref 145–400)
RBC: 3.84 10*6/uL (ref 3.70–5.45)
RDW: 14.6 % — ABNORMAL HIGH (ref 11.2–14.5)
WBC: 6.5 10*3/uL (ref 3.9–10.3)
lymph#: 1 10*3/uL (ref 0.9–3.3)

## 2010-05-07 LAB — COMPREHENSIVE METABOLIC PANEL
ALT: 13 U/L (ref 0–35)
AST: 13 U/L (ref 0–37)
Albumin: 3.8 g/dL (ref 3.5–5.2)
Alkaline Phosphatase: 59 U/L (ref 39–117)
BUN: 13 mg/dL (ref 6–23)
CO2: 32 mEq/L (ref 19–32)
Calcium: 9 mg/dL (ref 8.4–10.5)
Chloride: 100 mEq/L (ref 96–112)
Creatinine, Ser: 0.6 mg/dL (ref 0.40–1.20)
Glucose, Bld: 155 mg/dL — ABNORMAL HIGH (ref 70–99)
Potassium: 4.9 mEq/L (ref 3.5–5.3)
Sodium: 142 mEq/L (ref 135–145)
Total Bilirubin: 0.6 mg/dL (ref 0.3–1.2)
Total Protein: 6.5 g/dL (ref 6.0–8.3)

## 2010-05-07 LAB — LACTATE DEHYDROGENASE: LDH: 156 U/L (ref 94–250)

## 2010-05-07 LAB — CANCER ANTIGEN 27.29: CA 27.29: 71 U/mL — ABNORMAL HIGH (ref 0–39)

## 2010-05-12 ENCOUNTER — Encounter
Admission: RE | Admit: 2010-05-12 | Discharge: 2010-05-12 | Payer: Self-pay | Source: Home / Self Care | Attending: Thoracic Surgery | Admitting: Thoracic Surgery

## 2010-05-12 ENCOUNTER — Ambulatory Visit: Payer: Self-pay | Admitting: Thoracic Surgery

## 2010-05-19 ENCOUNTER — Encounter
Admission: RE | Admit: 2010-05-19 | Discharge: 2010-05-19 | Payer: Self-pay | Source: Home / Self Care | Attending: Thoracic Surgery | Admitting: Thoracic Surgery

## 2010-05-19 ENCOUNTER — Ambulatory Visit
Admission: RE | Admit: 2010-05-19 | Discharge: 2010-05-19 | Payer: Self-pay | Source: Home / Self Care | Attending: Thoracic Surgery | Admitting: Thoracic Surgery

## 2010-06-02 ENCOUNTER — Encounter
Admission: RE | Admit: 2010-06-02 | Discharge: 2010-06-02 | Payer: Self-pay | Source: Home / Self Care | Attending: Thoracic Surgery | Admitting: Thoracic Surgery

## 2010-06-02 ENCOUNTER — Ambulatory Visit
Admission: RE | Admit: 2010-06-02 | Discharge: 2010-06-02 | Payer: Self-pay | Source: Home / Self Care | Attending: Thoracic Surgery | Admitting: Thoracic Surgery

## 2010-06-14 ENCOUNTER — Other Ambulatory Visit (HOSPITAL_COMMUNITY)
Admission: RE | Admit: 2010-06-14 | Discharge: 2010-06-14 | Disposition: A | Discharge status: Home / Self Care | Payer: Medicare Other | Source: Ambulatory Visit | Attending: Radiation Oncology | Admitting: Radiation Oncology

## 2010-06-14 ENCOUNTER — Ambulatory Visit
Admission: RE | Admit: 2010-06-14 | Discharge: 2010-06-14 | Payer: Self-pay | Source: Home / Self Care | Attending: Radiation Oncology | Admitting: Radiation Oncology

## 2010-06-14 ENCOUNTER — Other Ambulatory Visit: Payer: Self-pay | Admitting: Radiation Oncology

## 2010-06-14 DIAGNOSIS — C549 Malignant neoplasm of corpus uteri, unspecified: Secondary | ICD-10-CM | POA: Insufficient documentation

## 2010-06-16 ENCOUNTER — Encounter: Payer: Self-pay | Admitting: Thoracic Surgery

## 2010-06-18 ENCOUNTER — Other Ambulatory Visit: Payer: Self-pay | Admitting: Oncology

## 2010-06-30 ENCOUNTER — Other Ambulatory Visit: Payer: Self-pay | Admitting: Oncology

## 2010-06-30 DIAGNOSIS — Z8542 Personal history of malignant neoplasm of other parts of uterus: Secondary | ICD-10-CM

## 2010-06-30 DIAGNOSIS — Z780 Asymptomatic menopausal state: Secondary | ICD-10-CM

## 2010-06-30 DIAGNOSIS — Z90710 Acquired absence of both cervix and uterus: Secondary | ICD-10-CM

## 2010-07-13 ENCOUNTER — Other Ambulatory Visit: Payer: Medicare Other

## 2010-07-14 ENCOUNTER — Ambulatory Visit
Admission: RE | Admit: 2010-07-14 | Discharge: 2010-07-14 | Disposition: A | Discharge status: Home / Self Care | Payer: Medicare Other | Source: Ambulatory Visit | Attending: Thoracic Surgery | Admitting: Thoracic Surgery

## 2010-07-14 ENCOUNTER — Other Ambulatory Visit: Payer: Self-pay | Admitting: Thoracic Surgery

## 2010-07-14 ENCOUNTER — Ambulatory Visit
Admission: RE | Admit: 2010-07-14 | Discharge: 2010-07-14 | Disposition: A | Discharge status: Home / Self Care | Payer: Medicare Other | Source: Ambulatory Visit | Attending: Oncology | Admitting: Oncology

## 2010-07-14 ENCOUNTER — Ambulatory Visit (INDEPENDENT_AMBULATORY_CARE_PROVIDER_SITE_OTHER): Payer: Self-pay | Admitting: Thoracic Surgery

## 2010-07-14 DIAGNOSIS — Z780 Asymptomatic menopausal state: Secondary | ICD-10-CM

## 2010-07-14 DIAGNOSIS — J910 Malignant pleural effusion: Secondary | ICD-10-CM

## 2010-07-14 DIAGNOSIS — Z90710 Acquired absence of both cervix and uterus: Secondary | ICD-10-CM

## 2010-07-14 DIAGNOSIS — D381 Neoplasm of uncertain behavior of trachea, bronchus and lung: Secondary | ICD-10-CM

## 2010-07-14 DIAGNOSIS — Z8542 Personal history of malignant neoplasm of other parts of uterus: Secondary | ICD-10-CM

## 2010-07-15 NOTE — Assessment & Plan Note (Unsigned)
OFFICE VISIT  Brittany, Porter DOB:  1945-08-12                                        July 14, 2010 CHART #:  [id number]  The patient is status post a left VATS, pleural biopsy, drainage of pleural effusion, talc pleurodesis with also insertion of Pleurx catheter on April 30, 2010.  Her Pleurx catheter has since been removed.  She was last seen in the office by Dr. Edwyna Shell on June 02, 2010.  She returns today for 6-week followup visit for 1 final check. The patient is without complaints today.  She is up ambulating well without difficulty.  She denies any shortness of breath, chest pain, wheezing, hemoptysis, fevers, nausea, vomiting.  She continues to follow up with Dr. Welton Flakes but states that she has completed her radiation and chemotherapy while ago.  PHYSICAL EXAMINATION:  Vital Signs:  Blood pressure 150/86, pulse of 96, respirations 16, O2 sats 94% on room air.  Respiratory:  Diminished breath sounds at the left base.  Cardiac:  Regular rate and rhythm. Incisions all clean, dry, and intact, and healed.  STUDIES:  The patient had PA and lateral chest x-ray obtained today which shows stable mild-to-moderate left pleural effusion, left basilar airspace disease.  IMPRESSION AND PLAN:  The patient was seen and evaluated by Dr. Edwyna Shell. Brittany Porter has progressed well postoperatively.  At this point, it was felt that she is stable and could be released from the office.  She will plan to continue followup with Dr. Welton Flakes.  She can contact us if she has any surgical questions.  The patient is in agreement.  Sol Blazing, PA  KMD/MEDQ  D:  07/14/2010  T:  07/15/2010  Job:  [id number]  cc:   Dr. Welton Flakes

## 2010-07-26 LAB — FUNGUS CULTURE W SMEAR: Fungal Smear: NONE SEEN

## 2010-07-26 LAB — BASIC METABOLIC PANEL
BUN: 8 mg/dL (ref 6–23)
CO2: 25 mEq/L (ref 19–32)
Calcium: 8.2 mg/dL — ABNORMAL LOW (ref 8.4–10.5)
Chloride: 107 mEq/L (ref 96–112)
Creatinine, Ser: 0.6 mg/dL (ref 0.4–1.2)
GFR calc Af Amer: >60 mL/min (ref >60)
GFR calc non Af Amer: >60 mL/min (ref >60)
Glucose, Bld: 164 mg/dL — ABNORMAL HIGH (ref 70–99)
Potassium: 4 mEq/L (ref 3.5–5.1)
Sodium: 137 mEq/L (ref 135–145)

## 2010-07-26 LAB — POCT I-STAT 3, ART BLOOD GAS (G3+)
Bicarbonate: 24.5 mEq/L — ABNORMAL HIGH (ref 20.0–24.0)
O2 Saturation: 86 %
Patient temperature: 101.5
TCO2: 26 mmol/L (ref 0–100)
pCO2 arterial: 39.9 mmHg (ref 35.0–45.0)
pH, Arterial: 7.403 — ABNORMAL HIGH (ref 7.350–7.400)
pO2, Arterial: 56 mmHg — ABNORMAL LOW (ref 80.0–100.0)

## 2010-07-26 LAB — CBC
HCT: 29.8 % — ABNORMAL LOW (ref 36.0–46.0)
HCT: 31.3 % — ABNORMAL LOW (ref 36.0–46.0)
Hemoglobin: 10.2 g/dL — ABNORMAL LOW (ref 12.0–15.0)
Hemoglobin: 9.5 g/dL — ABNORMAL LOW (ref 12.0–15.0)
MCH: 29 pg (ref 26.0–34.0)
MCH: 29.4 pg (ref 26.0–34.0)
MCHC: 31.9 g/dL (ref 30.0–36.0)
MCHC: 32.6 g/dL (ref 30.0–36.0)
MCV: 90.2 fL (ref 78.0–100.0)
MCV: 90.9 fL (ref 78.0–100.0)
Platelets: 116 10*3/uL — ABNORMAL LOW (ref 150–400)
Platelets: 131 10*3/uL — ABNORMAL LOW (ref 150–400)
RBC: 3.28 MIL/uL — ABNORMAL LOW (ref 3.87–5.11)
RBC: 3.47 MIL/uL — ABNORMAL LOW (ref 3.87–5.11)
RDW: 14 % (ref 11.5–15.5)
RDW: 14.1 % (ref 11.5–15.5)
WBC: 6.1 10*3/uL (ref 4.0–10.5)
WBC: 6.5 10*3/uL (ref 4.0–10.5)

## 2010-07-26 LAB — GLUCOSE, CAPILLARY
Glucose-Capillary: 107 mg/dL — ABNORMAL HIGH (ref 70–99)
Glucose-Capillary: 116 mg/dL — ABNORMAL HIGH (ref 70–99)
Glucose-Capillary: 138 mg/dL — ABNORMAL HIGH (ref 70–99)
Glucose-Capillary: 140 mg/dL — ABNORMAL HIGH (ref 70–99)
Glucose-Capillary: 149 mg/dL — ABNORMAL HIGH (ref 70–99)
Glucose-Capillary: 159 mg/dL — ABNORMAL HIGH (ref 70–99)
Glucose-Capillary: 160 mg/dL — ABNORMAL HIGH (ref 70–99)
Glucose-Capillary: 162 mg/dL — ABNORMAL HIGH (ref 70–99)
Glucose-Capillary: 166 mg/dL — ABNORMAL HIGH (ref 70–99)
Glucose-Capillary: 167 mg/dL — ABNORMAL HIGH (ref 70–99)
Glucose-Capillary: 176 mg/dL — ABNORMAL HIGH (ref 70–99)
Glucose-Capillary: 178 mg/dL — ABNORMAL HIGH (ref 70–99)
Glucose-Capillary: 199 mg/dL — ABNORMAL HIGH (ref 70–99)
Glucose-Capillary: 203 mg/dL — ABNORMAL HIGH (ref 70–99)
Glucose-Capillary: 216 mg/dL — ABNORMAL HIGH (ref 70–99)
Glucose-Capillary: 219 mg/dL — ABNORMAL HIGH (ref 70–99)
Glucose-Capillary: 286 mg/dL — ABNORMAL HIGH (ref 70–99)
Glucose-Capillary: 305 mg/dL — ABNORMAL HIGH (ref 70–99)

## 2010-07-26 LAB — COMPREHENSIVE METABOLIC PANEL
ALT: 17 U/L (ref 0–35)
AST: 15 U/L (ref 0–37)
Albumin: 2.3 g/dL — ABNORMAL LOW (ref 3.5–5.2)
Alkaline Phosphatase: 30 U/L — ABNORMAL LOW (ref 39–117)
BUN: 7 mg/dL (ref 6–23)
CO2: 26 mEq/L (ref 19–32)
Calcium: 7.7 mg/dL — ABNORMAL LOW (ref 8.4–10.5)
Chloride: 104 mEq/L (ref 96–112)
Creatinine, Ser: 0.6 mg/dL (ref 0.4–1.2)
GFR calc Af Amer: >60 mL/min (ref >60)
GFR calc non Af Amer: >60 mL/min (ref >60)
Glucose, Bld: 132 mg/dL — ABNORMAL HIGH (ref 70–99)
Potassium: 3.9 mEq/L (ref 3.5–5.1)
Sodium: 136 mEq/L (ref 135–145)
Total Bilirubin: 0.9 mg/dL (ref 0.3–1.2)
Total Protein: 5 g/dL — ABNORMAL LOW (ref 6.0–8.3)

## 2010-07-26 LAB — AFB CULTURE WITH SMEAR (NOT AT ARMC): Acid Fast Smear: NONE SEEN

## 2010-07-26 LAB — BODY FLUID CULTURE: Culture: NO GROWTH

## 2010-07-26 LAB — MRSA PCR SCREENING: MRSA by PCR: NEGATIVE

## 2010-07-27 LAB — CBC
HCT: 38.1 % (ref 36.0–46.0)
Hemoglobin: 12.6 g/dL (ref 12.0–15.0)
MCH: 29.3 pg (ref 26.0–34.0)
MCHC: 33.1 g/dL (ref 30.0–36.0)
MCV: 88.6 fL (ref 78.0–100.0)
Platelets: 144 10*3/uL — ABNORMAL LOW (ref 150–400)
RBC: 4.3 MIL/uL (ref 3.87–5.11)
RDW: 13.3 % (ref 11.5–15.5)
WBC: 6.6 10*3/uL (ref 4.0–10.5)

## 2010-07-27 LAB — URINE MICROSCOPIC-ADD ON

## 2010-07-27 LAB — BLOOD GAS, ARTERIAL
Acid-Base Excess: 1.7 mmol/L (ref 0.0–2.0)
Bicarbonate: 25.5 mEq/L — ABNORMAL HIGH (ref 20.0–24.0)
Drawn by: 206361
FIO2: 0.21 %
O2 Saturation: 97.8 %
Patient temperature: 98.6
TCO2: 26.7 mmol/L (ref 0–100)
pCO2 arterial: 38.7 mmHg (ref 35.0–45.0)
pH, Arterial: 7.435 — ABNORMAL HIGH (ref 7.350–7.400)
pO2, Arterial: 93.2 mmHg (ref 80.0–100.0)

## 2010-07-27 LAB — URINALYSIS, ROUTINE W REFLEX MICROSCOPIC
Bilirubin Urine: NEGATIVE
Glucose, UA: NEGATIVE mg/dL
Hgb urine dipstick: NEGATIVE
Ketones, ur: NEGATIVE mg/dL
Nitrite: NEGATIVE
Protein, ur: NEGATIVE mg/dL
Specific Gravity, Urine: 1.019 (ref 1.005–1.030)
Urobilinogen, UA: 0.2 mg/dL (ref 0.0–1.0)
pH: 7.5 (ref 5.0–8.0)

## 2010-07-27 LAB — COMPREHENSIVE METABOLIC PANEL
ALT: 35 U/L (ref 0–35)
AST: 39 U/L — ABNORMAL HIGH (ref 0–37)
Albumin: 3.7 g/dL (ref 3.5–5.2)
Alkaline Phosphatase: 45 U/L (ref 39–117)
BUN: 10 mg/dL (ref 6–23)
CO2: 23 mEq/L (ref 19–32)
Calcium: 8.9 mg/dL (ref 8.4–10.5)
Chloride: 109 mEq/L (ref 96–112)
Creatinine, Ser: 0.56 mg/dL (ref 0.4–1.2)
GFR calc Af Amer: >60 mL/min (ref >60)
GFR calc non Af Amer: >60 mL/min (ref >60)
Glucose, Bld: 126 mg/dL — ABNORMAL HIGH (ref 70–99)
Potassium: 4.3 mEq/L (ref 3.5–5.1)
Sodium: 140 mEq/L (ref 135–145)
Total Bilirubin: 0.8 mg/dL (ref 0.3–1.2)
Total Protein: 6.3 g/dL (ref 6.0–8.3)

## 2010-07-27 LAB — TYPE AND SCREEN
ABO/RH(D): O POS
Antibody Screen: POSITIVE
DAT, IgG: NEGATIVE
Donor AG Type: NEGATIVE
Donor AG Type: NEGATIVE
Unit division: 0
Unit division: 0

## 2010-07-27 LAB — PROTIME-INR
INR: 1.02 (ref 0.00–1.49)
Prothrombin Time: 13.6 seconds (ref 11.6–15.2)

## 2010-07-27 LAB — GLUCOSE, CAPILLARY: Glucose-Capillary: 183 mg/dL — ABNORMAL HIGH (ref 70–99)

## 2010-07-27 LAB — APTT: aPTT: 32 seconds (ref 24–37)

## 2010-07-27 LAB — POCT I-STAT, CHEM 8
BUN: 11 mg/dL (ref 6–23)
Calcium, Ion: 1.13 mmol/L (ref 1.12–1.32)
Chloride: 103 mEq/L (ref 96–112)
Creatinine, Ser: 0.7 mg/dL (ref 0.4–1.2)
Glucose, Bld: 182 mg/dL — ABNORMAL HIGH (ref 70–99)
HCT: 40 % (ref 36.0–46.0)
Hemoglobin: 13.6 g/dL (ref 12.0–15.0)
Potassium: 4.5 mEq/L (ref 3.5–5.1)
Sodium: 141 mEq/L (ref 135–145)
TCO2: 30 mmol/L (ref 0–100)

## 2010-07-27 LAB — SURGICAL PCR SCREEN
MRSA, PCR: NEGATIVE
Staphylococcus aureus: POSITIVE — AB

## 2010-08-09 ENCOUNTER — Encounter (HOSPITAL_BASED_OUTPATIENT_CLINIC_OR_DEPARTMENT_OTHER): Payer: Medicare Other | Admitting: Oncology

## 2010-08-09 ENCOUNTER — Other Ambulatory Visit: Payer: Self-pay | Admitting: Oncology

## 2010-08-09 DIAGNOSIS — C569 Malignant neoplasm of unspecified ovary: Secondary | ICD-10-CM

## 2010-08-09 DIAGNOSIS — D649 Anemia, unspecified: Secondary | ICD-10-CM

## 2010-08-09 DIAGNOSIS — Z853 Personal history of malignant neoplasm of breast: Secondary | ICD-10-CM

## 2010-08-09 DIAGNOSIS — C549 Malignant neoplasm of corpus uteri, unspecified: Secondary | ICD-10-CM

## 2010-08-09 DIAGNOSIS — C4359 Malignant melanoma of other part of trunk: Secondary | ICD-10-CM

## 2010-08-09 LAB — VITAMIN D 25 HYDROXY (VIT D DEFICIENCY, FRACTURES): Vit D, 25-Hydroxy: 43 ng/mL (ref 30–89)

## 2010-08-09 LAB — CBC WITH DIFFERENTIAL/PLATELET
BASO%: 0.5 % (ref 0.0–2.0)
Basophils Absolute: 0 10*3/uL (ref 0.0–0.1)
EOS%: 0.8 % (ref 0.0–7.0)
Eosinophils Absolute: 0 10*3/uL (ref 0.0–0.5)
HCT: 38.2 % (ref 34.8–46.6)
HGB: 12.5 g/dL (ref 11.6–15.9)
LYMPH%: 19.1 % (ref 14.0–49.7)
MCH: 27.2 pg (ref 25.1–34.0)
MCHC: 32.7 g/dL (ref 31.5–36.0)
MCV: 83.4 fL (ref 79.5–101.0)
MONO#: 0.3 10*3/uL (ref 0.1–0.9)
MONO%: 5.5 % (ref 0.0–14.0)
NEUT#: 3.6 10*3/uL (ref 1.5–6.5)
NEUT%: 74.1 % (ref 38.4–76.8)
Platelets: 165 10*3/uL (ref 145–400)
RBC: 4.58 10*6/uL (ref 3.70–5.45)
RDW: 16.3 % — ABNORMAL HIGH (ref 11.2–14.5)
WBC: 4.9 10*3/uL (ref 3.9–10.3)
lymph#: 0.9 10*3/uL (ref 0.9–3.3)

## 2010-08-09 LAB — COMPREHENSIVE METABOLIC PANEL
ALT: 11 U/L (ref 0–35)
AST: 16 U/L (ref 0–37)
Albumin: 4.1 g/dL (ref 3.5–5.2)
Alkaline Phosphatase: 49 U/L (ref 39–117)
BUN: 10 mg/dL (ref 6–23)
CO2: 30 mEq/L (ref 19–32)
Calcium: 9.1 mg/dL (ref 8.4–10.5)
Chloride: 101 mEq/L (ref 96–112)
Creatinine, Ser: 0.6 mg/dL (ref 0.40–1.20)
Glucose, Bld: 250 mg/dL — ABNORMAL HIGH (ref 70–99)
Potassium: 3.8 mEq/L (ref 3.5–5.3)
Sodium: 142 mEq/L (ref 135–145)
Total Bilirubin: 0.6 mg/dL (ref 0.3–1.2)
Total Protein: 6.7 g/dL (ref 6.0–8.3)

## 2010-08-19 ENCOUNTER — Ambulatory Visit: Payer: Medicare Other | Attending: Gynecologic Oncology | Admitting: Gynecologic Oncology

## 2010-08-19 DIAGNOSIS — C549 Malignant neoplasm of corpus uteri, unspecified: Secondary | ICD-10-CM | POA: Insufficient documentation

## 2010-08-19 DIAGNOSIS — Z90710 Acquired absence of both cervix and uterus: Secondary | ICD-10-CM | POA: Insufficient documentation

## 2010-08-19 DIAGNOSIS — Z9079 Acquired absence of other genital organ(s): Secondary | ICD-10-CM | POA: Insufficient documentation

## 2010-08-19 DIAGNOSIS — C569 Malignant neoplasm of unspecified ovary: Secondary | ICD-10-CM | POA: Insufficient documentation

## 2010-08-19 DIAGNOSIS — Z85820 Personal history of malignant melanoma of skin: Secondary | ICD-10-CM | POA: Insufficient documentation

## 2010-08-19 DIAGNOSIS — I10 Essential (primary) hypertension: Secondary | ICD-10-CM | POA: Insufficient documentation

## 2010-08-19 DIAGNOSIS — Z9221 Personal history of antineoplastic chemotherapy: Secondary | ICD-10-CM | POA: Insufficient documentation

## 2010-08-19 DIAGNOSIS — C50919 Malignant neoplasm of unspecified site of unspecified female breast: Secondary | ICD-10-CM | POA: Insufficient documentation

## 2010-08-19 LAB — DIFFERENTIAL
Basophils Absolute: 0.1 10*3/uL (ref 0.0–0.1)
Basophils Relative: 1 % (ref 0–1)
Eosinophils Absolute: 0.1 10*3/uL (ref 0.0–0.7)
Eosinophils Relative: 1 % (ref 0–5)
Lymphocytes Relative: 20 % (ref 12–46)
Lymphs Abs: 2.2 10*3/uL (ref 0.7–4.0)
Monocytes Absolute: 0.7 10*3/uL (ref 0.1–1.0)
Monocytes Relative: 6 % (ref 3–12)
Neutro Abs: 7.8 10*3/uL — ABNORMAL HIGH (ref 1.7–7.7)
Neutrophils Relative %: 72 % (ref 43–77)
Smear Review: DECREASED

## 2010-08-19 LAB — COMPREHENSIVE METABOLIC PANEL
ALT: 19 U/L (ref 0–35)
AST: 22 U/L (ref 0–37)
Albumin: 3.9 g/dL (ref 3.5–5.2)
Alkaline Phosphatase: 89 U/L (ref 39–117)
BUN: 13 mg/dL (ref 6–23)
CO2: 32 mEq/L (ref 19–32)
Calcium: 9.6 mg/dL (ref 8.4–10.5)
Chloride: 104 mEq/L (ref 96–112)
Creatinine, Ser: 0.54 mg/dL (ref 0.4–1.2)
GFR calc Af Amer: >60 mL/min (ref >60)
GFR calc non Af Amer: >60 mL/min (ref >60)
Glucose, Bld: 123 mg/dL — ABNORMAL HIGH (ref 70–99)
Potassium: 4.4 mEq/L (ref 3.5–5.1)
Sodium: 145 mEq/L (ref 135–145)
Total Bilirubin: 1 mg/dL (ref 0.3–1.2)
Total Protein: 6.5 g/dL (ref 6.0–8.3)

## 2010-08-19 LAB — URINALYSIS, ROUTINE W REFLEX MICROSCOPIC
Bilirubin Urine: NEGATIVE
Glucose, UA: NEGATIVE mg/dL
Hgb urine dipstick: NEGATIVE
Ketones, ur: NEGATIVE mg/dL
Nitrite: NEGATIVE
Protein, ur: NEGATIVE mg/dL
Specific Gravity, Urine: 1.006 (ref 1.005–1.030)
Urobilinogen, UA: 0.2 mg/dL (ref 0.0–1.0)
pH: 6 (ref 5.0–8.0)

## 2010-08-19 LAB — CBC
HCT: 35.6 % — ABNORMAL LOW (ref 36.0–46.0)
Hemoglobin: 12.1 g/dL (ref 12.0–15.0)
MCHC: 33.9 g/dL (ref 30.0–36.0)
MCV: 88.6 fL (ref 78.0–100.0)
Platelets: 122 10*3/uL — ABNORMAL LOW (ref 150–400)
RBC: 4.02 MIL/uL (ref 3.87–5.11)
RDW: 24.5 % — ABNORMAL HIGH (ref 11.5–15.5)
WBC: 10.9 10*3/uL — ABNORMAL HIGH (ref 4.0–10.5)

## 2010-08-19 LAB — GLUCOSE, CAPILLARY
Glucose-Capillary: 152 mg/dL — ABNORMAL HIGH (ref 70–99)
Glucose-Capillary: 99 mg/dL (ref 70–99)

## 2010-08-23 LAB — CBC
HCT: 30 % — ABNORMAL LOW (ref 36.0–46.0)
Hemoglobin: 9.8 g/dL — ABNORMAL LOW (ref 12.0–15.0)
MCHC: 32.7 g/dL (ref 30.0–36.0)
MCV: 81.3 fL (ref 78.0–100.0)
Platelets: 220 10*3/uL (ref 150–400)
RBC: 3.69 MIL/uL — ABNORMAL LOW (ref 3.87–5.11)
RDW: 17.1 % — ABNORMAL HIGH (ref 11.5–15.5)
WBC: 7.8 10*3/uL (ref 4.0–10.5)

## 2010-08-23 LAB — GLUCOSE, CAPILLARY
Glucose-Capillary: 108 mg/dL — ABNORMAL HIGH (ref 70–99)
Glucose-Capillary: 125 mg/dL — ABNORMAL HIGH (ref 70–99)
Glucose-Capillary: 141 mg/dL — ABNORMAL HIGH (ref 70–99)
Glucose-Capillary: 144 mg/dL — ABNORMAL HIGH (ref 70–99)
Glucose-Capillary: 148 mg/dL — ABNORMAL HIGH (ref 70–99)
Glucose-Capillary: 161 mg/dL — ABNORMAL HIGH (ref 70–99)
Glucose-Capillary: 175 mg/dL — ABNORMAL HIGH (ref 70–99)
Glucose-Capillary: 276 mg/dL — ABNORMAL HIGH (ref 70–99)

## 2010-08-23 LAB — TYPE AND SCREEN
ABO/RH(D): O POS
Antibody Screen: POSITIVE
DAT, IgG: NEGATIVE
Donor AG Type: NEGATIVE
Donor AG Type: NEGATIVE
PT AG Type: NEGATIVE

## 2010-08-23 LAB — HEMOGLOBIN A1C
Hgb A1c MFr Bld: 7.3 % — ABNORMAL HIGH (ref 4.6–6.1)
Mean Plasma Glucose: 163 mg/dL

## 2010-08-23 LAB — HEMOGLOBIN AND HEMATOCRIT, BLOOD
HCT: 37.2 % (ref 36.0–46.0)
Hemoglobin: 11.8 g/dL — ABNORMAL LOW (ref 12.0–15.0)

## 2010-08-23 LAB — ABO/RH: ABO/RH(D): O POS

## 2010-08-23 LAB — BASIC METABOLIC PANEL
BUN: 5 mg/dL — ABNORMAL LOW (ref 6–23)
CO2: 28 mEq/L (ref 19–32)
Calcium: 7.9 mg/dL — ABNORMAL LOW (ref 8.4–10.5)
Chloride: 103 mEq/L (ref 96–112)
Creatinine, Ser: 0.54 mg/dL (ref 0.4–1.2)
GFR calc Af Amer: >60 mL/min (ref >60)
GFR calc non Af Amer: >60 mL/min (ref >60)
Glucose, Bld: 222 mg/dL — ABNORMAL HIGH (ref 70–99)
Potassium: 5 mEq/L (ref 3.5–5.1)
Sodium: 135 mEq/L (ref 135–145)

## 2010-08-24 LAB — URINE MICROSCOPIC-ADD ON

## 2010-08-24 LAB — COMPREHENSIVE METABOLIC PANEL
ALT: 17 U/L (ref 0–35)
AST: 24 U/L (ref 0–37)
Albumin: 3.4 g/dL — ABNORMAL LOW (ref 3.5–5.2)
Alkaline Phosphatase: 176 U/L — ABNORMAL HIGH (ref 39–117)
BUN: 17 mg/dL (ref 6–23)
CO2: 23 mEq/L (ref 19–32)
Calcium: 9 mg/dL (ref 8.4–10.5)
Chloride: 105 mEq/L (ref 96–112)
Creatinine, Ser: 0.96 mg/dL (ref 0.4–1.2)
GFR calc Af Amer: >60 mL/min (ref >60)
GFR calc non Af Amer: 58 mL/min — ABNORMAL LOW (ref >60)
Glucose, Bld: 259 mg/dL — ABNORMAL HIGH (ref 70–99)
Potassium: 4.4 mEq/L (ref 3.5–5.1)
Sodium: 139 mEq/L (ref 135–145)
Total Bilirubin: 2.3 mg/dL — ABNORMAL HIGH (ref 0.3–1.2)
Total Protein: 6.6 g/dL (ref 6.0–8.3)

## 2010-08-24 LAB — URINALYSIS, ROUTINE W REFLEX MICROSCOPIC
Glucose, UA: 100 mg/dL — AB
Hgb urine dipstick: NEGATIVE
Ketones, ur: 15 mg/dL — AB
Nitrite: POSITIVE — AB
Protein, ur: 100 mg/dL — AB
Specific Gravity, Urine: 1.031 — ABNORMAL HIGH (ref 1.005–1.030)
Urobilinogen, UA: 1 mg/dL (ref 0.0–1.0)
pH: 5 (ref 5.0–8.0)

## 2010-08-24 LAB — DIFFERENTIAL
Basophils Absolute: 0 10*3/uL (ref 0.0–0.1)
Basophils Relative: 0 % (ref 0–1)
Eosinophils Absolute: 0 10*3/uL (ref 0.0–0.7)
Eosinophils Relative: 0 % (ref 0–5)
Lymphocytes Relative: 4 % — ABNORMAL LOW (ref 12–46)
Lymphs Abs: 0.6 10*3/uL — ABNORMAL LOW (ref 0.7–4.0)
Monocytes Absolute: 0.3 10*3/uL (ref 0.1–1.0)
Monocytes Relative: 3 % (ref 3–12)
Neutro Abs: 11.8 10*3/uL — ABNORMAL HIGH (ref 1.7–7.7)
Neutrophils Relative %: 93 % — ABNORMAL HIGH (ref 43–77)

## 2010-08-24 LAB — CBC
HCT: 46.1 % — ABNORMAL HIGH (ref 36.0–46.0)
Hemoglobin: 15.2 g/dL — ABNORMAL HIGH (ref 12.0–15.0)
MCHC: 33 g/dL (ref 30.0–36.0)
MCV: 80.7 fL (ref 78.0–100.0)
Platelets: 223 10*3/uL (ref 150–400)
RBC: 5.72 MIL/uL — ABNORMAL HIGH (ref 3.87–5.11)
RDW: 16.5 % — ABNORMAL HIGH (ref 11.5–15.5)
WBC: 12.7 10*3/uL — ABNORMAL HIGH (ref 4.0–10.5)

## 2010-08-24 LAB — BODY FLUID CULTURE
Culture: NO GROWTH
Gram Stain: NONE SEEN

## 2010-08-24 LAB — BODY FLUID CELL COUNT WITH DIFFERENTIAL
Lymphs, Fluid: 6 %
Monocyte-Macrophage-Serous Fluid: 81 % (ref 50–90)
Neutrophil Count, Fluid: 13 % (ref 0–25)
Total Nucleated Cell Count, Fluid: 9740 cu mm — ABNORMAL HIGH (ref 0–1000)

## 2010-08-24 LAB — MISCELLANEOUS TEST

## 2010-08-24 LAB — URINE CULTURE
Colony Count: NO GROWTH
Culture: NO GROWTH

## 2010-08-24 LAB — APTT: aPTT: 35 seconds (ref 24–37)

## 2010-08-24 LAB — PROTIME-INR
INR: 1.2 (ref 0.00–1.49)
Prothrombin Time: 15.7 seconds — ABNORMAL HIGH (ref 11.6–15.2)

## 2010-08-24 LAB — PATHOLOGIST SMEAR REVIEW

## 2010-08-24 LAB — CA 125: CA 125: 574.5 U/mL — ABNORMAL HIGH (ref 0.0–30.2)

## 2010-08-24 LAB — LIPASE, BLOOD: Lipase: 13 U/L (ref 11–59)

## 2010-08-24 NOTE — Consult Note (Signed)
NAME:  Brittany Porter, Brittany Porter NO.:  [id number]  MEDICAL RECORD NO.:  [id number]           PATIENT TYPE:  LOCATION:                                 FACILITY:  PHYSICIAN:  [name] Schimke, MD     DATE OF BIRTH:  1946-04-28  DATE OF CONSULTATION:  08/19/2010 DATE OF DISCHARGE:                                CONSULTATION   Visit #[id number].  REASON FOR VISIT:  Endometrial cancer surveillance.  HISTORY OF PRESENT ILLNESS:  This is a 65 year old who on May 16, 2008, underwent operative staging of presumed endometrioid adenocarcinoma.  Intraoperative findings were notable for endometrioid cancer of the right ovary, in addition to her endometrial cancer.  She underwent total abdominal hysterectomy, bilateral salpingo-oophorectomy, omentectomy, appendectomy and umbilical hernia repair.  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  She subsequently received 6 cycles of Taxol and carboplatin therapy, last cycle in November 2010.  Underwent vaginal cuff brachytherapy for her stage II endometrial cancer. Brachytherapy was completed in January 2011.  Brittany Porter is without any evidence of disease of her stage II endometrial cancer and a stage I ovarian cancer. Unfortunately, Brittany Porter has had a recurrence of her breast  cancer discovered this year for which she is on Letrozole under the care of  Dr Park Breed.    PAST MEDICAL HISTORY: 1. Bilateral breast cancer, recurrent. 2. Melanoma of the back. 3. Stage IC endometrioid ovarian carcinoma. 4. Stage II endometrioid endometrial adenocarcinoma. 5. Hypertension.  PAST SURGICAL HISTORY:  No interval surgical history.  SOCIAL HISTORY:  Brittany Porter and her husband live with son and grandson. She celebrates 65th birthday this weekend.  The entire family is renting a 6 bedroom home in [place] for a week.  REVIEW OF SYSTEMS:  She states that there is only some  mild shortness of breath.  She reports an almost 15-pound weight loss secondary to reduced oral intake.  No nausea, vomiting, or abdominal distention.  No back pain.  Otherwise, 10-point review of systems is negative.  PHYSICAL EXAMINATION:  GENERAL:  Well-developed female, in no acute distress. VITAL SIGNS:  Weight 217 pounds, blood pressure 130/70, pulse of 68. CHEST:  Clear to auscultation.  Faint breath sounds. LYMPH NODE SURVEY:  No cervical, subclavicular, or inguinal adenopathy. ABDOMEN:  Soft, nontender, obese.  Midline incision appreciated without any hernia. PELVIC:  Normal external genitalia, Bartholin, urethra, and Skene, Foreshortened vagina.  No palpable nodularity or masses.  RECTAL:  Good anal sphincter tone without any masses.  IMPRESSION:  Brittany Porter is without any evidence of disease from her stage II endometrial cancer and her stage IC ovarian cancer.  I have asked her to follow up in 6 months with me and I am aware that she is following up in 3 months with Dr. Roselind Messier.     [name] Schimke, MD     WB/MEDQ  D:  08/19/2010  T:  08/20/2010  Job:  [id number]  cc:   Drue Second, M.D. Fax: [phone]  Dario Guardian, M.D. Fax: [phone]  Telford Nab, R.N. [address] [place], [place] [address]  Angelia Mould. Derrell Lolling, M.D. [address], [address] [place] [address]  Billie Lade, Ph.D., M.D. Fax: [phone]  Electronically Signed by [name] Schimke MD on 08/24/2010 07:04:44 AM

## 2010-09-28 NOTE — Letter (Signed)
May 12, 2010   [name] Schimke, MD  [address] [place], [place] [address]   Re:  Brittany Porter, Brittany Porter                 DOB:  [birth year]   Dear Dr. Nelly Rout:   I saw the patient back in the office today.  Her Pleurx is draining  about 50 mL each time we did a talc pleurodesis and pleural biopsy on  her and her pathology shows metastatic breast cancer.  She will be  seeing Dr. Drue Second for treatment and I removed her chest tube  sutures today.  I will see her back again in 1 week and be able to pull  the Pleurx at that time.  She is on a Monday, Friday drainage.   Ines Bloomer, M.D.  Electronically Signed   DPB/MEDQ  D:  05/12/2010  T:  05/13/2010  Job:  [id number]   cc:   Dario Guardian, M.D.  Drue Second, MD

## 2010-09-28 NOTE — Discharge Summary (Signed)
NAME:  Brittany Porter, Brittany Porter                 ACCOUNT NO.:  [id number]   MEDICAL RECORD NO.:  [id number]          PATIENT TYPE:  INP   LOCATION:  1535                         FACILITY:  [facility]   PHYSICIAN:  Sherron Monday, MD        DATE OF BIRTH:  Apr 18, 1946   DATE OF ADMISSION:  10/14/2008  DATE OF DISCHARGE:  10/17/2008                               DISCHARGE SUMMARY   ADMISSION DIAGNOSIS:  Complaints of abdominal pain and a pelvic mass  with likely endometrial cancer with spread or possible ovarian cancer.   HISTORY OF PRESENT ILLNESS:  A 65 year old G3, P3 who presents to  emergency room with abdominal pain and a large abdominal ascites noted  with a pelvic mass on CT scan as well as rather complex ovarian cyst as  well as ascites and an omental cake.  She recently was seen in the  office by Dr. Stanford Breed and had an endometrial biopsy as well as  had a recent thoracentesis secondary to discomfort.   PAST MEDICAL HISTORY:  Bilateral breast cancer in 1997 treated with  mastectomy and chemotherapy and radiation.   PAST SURGICAL HISTORY:  Bilateral mastectomies as well as a ganglion  cyst surgery.   PAST OB/GYN HISTORY:  Gravida 3, para 3 with three term deliveries  without complications.  No abnormal Pap smears.  No sexually transmitted  diseases.   MEDICATIONS:  Percocet, ibuprofen.   ALLERGIES:  SULFA which causes swelling.   SOCIAL HISTORY:  She is married.  Denies alcohol, tobacco or drug use.   FAMILY HISTORY:  Significant for a sister had colon cancer and a sister  with breast cancer.   PHYSICAL EXAMINATION:  VITAL SIGNS:  On admission afebrile.  Vital signs  stable.  ABDOMEN:  Distended abdomen and fluid wave without palpable mass and  enlarged uterus.  The mass is not able to be palpated secondary to her  habitus.  Cervical polyp was noted and removed.   HOSPITAL COURSE:  She was admitted and underwent a TAH/BSO, omentectomy,  umbilical hernia repair, appendectomy, on  October 14, 2008 by Dr. [name] Schimke and Dr. Sherron Monday without complications. In surgery a 10 cm  uterus, 15 cm  right adnexal mass adherent to the appendixwere noted.  The uterus, ovaries and fallopian tubes and appendix were sent to  pathology.  Her estimated blood loss was 500 mL.   Her postoperative course was relatively uncomplicated. Her hemoglobin  decreased from 11.8 immediately postoperative to 9.8 the next morning.  Her sugars were noted to be elevated the 220.  She was started on  sliding scale insulin regimen and she only received one dose of insulin.  She will follow this up with her general doctor.  Throughout her  postoperative course she had good urine output.  She had passed gas and  had a bowel movement on postoperative day #2.  Her diet was advanced.  She was ambulating without difficulty.  She will be discharged to home  on postoperative day #3.  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  She will follow up  with Dr. Drue Second, Dr. Severiano Gilbert and the gynecology/oncology  surgeons.   DISCHARGE MEDICATIONS:  She was discharged home with Percocet and  Lovenox.   She was given routine discharge instructions and is to call with any  questions or problems.  She voiced understanding to all this.      Sherron Monday, MD  Electronically Signed     JB/MEDQ  D:  10/17/2008  T:  10/17/2008  Job:  [id number]   cc:   Telford Nab, R.N.  [address]  [place], [place] [address]   Drue Second, MD  Fax: [phone]   Dario Guardian, M.D.  Fax: [phone]

## 2010-09-28 NOTE — Consult Note (Signed)
NAME:  Brittany Porter, SUBIA NO.:  [id number]   MEDICAL RECORD NO.:  [id number]          PATIENT TYPE:  OUT   LOCATION:  GYN                          FACILITY:  [facility]   PHYSICIAN:  [name] Schimke, MD     DATE OF BIRTH:  Sep 12, 1945   DATE OF CONSULTATION:  11/20/2008  DATE OF DISCHARGE:                                 CONSULTATION   REASON FOR VISIT:  Postoperative check.   HISTORY OF PRESENT ILLNESS:  This is a 65 year old who presented with a  known diagnosis of an endometrioid adenocarcinoma.  On October 14, 2008 she  underwent total abdominal hysterectomy, bilateral salpingo-oophorectomy,  omentectomy, appendectomy and umbilical hernia repair.  Final pathology  was notable for an endometrioid carcinoma grade 2 of the right ovary  extending to the ovarian surface.  No disease was noted in the omentum  or in the appendix.  The uterus was noted to have an endometrioid  adenocarcinoma grade 1 with tumor invasion to the outer half of the  endometrium with tumor invading into the cervical stroma.  As such, she  is a stage II endometrioid adenocarcinoma and a stage IC endometrioid  adenocarcinoma of the ovary given the positive washings.  She has been  seen by Dr. Welton Flakes with the plan for Taxol and carboplatin therapy for six  cycles followed by vaginal vault brachytherapy after completion of  chemotherapy.  At hits visit she informs me that her first cycle of  chemotherapy is scheduled for December 02, 2008.  She reports some scant  vaginal bloody discharge.   PHYSICAL EXAMINATION:  Weight 229 pounds, blood pressure 130/70.  ABDOMEN:  Soft, nontender.  Midline incision is healing well.  No  evidence of a hernia or erythema.  Examination of the vagina is notable for cuff with scant pink vaginal  discharge.  Cuff separation is not appreciated.   IMPRESSION:  1. Stage IC ovarian cancer.  Brittany Porter will receive six cycles of      Taxol, carboplatin therapy, the first to be  administered on December 02, 2008.  2. Stage II endometrial cancer.  After completion of Taxol carboplatin      therapy vaginal vault brachytherapy will be administered.  3. I have asked Brittany Porter to follow up in 2 months.      [name] Schimke, MD  Electronically Signed     WB/MEDQ  D:  11/20/2008  T:  11/20/2008  Job:  [id number]   cc:   Telford Nab, R.N.  [address]  [place], [place] [address]   [place], Dr,   Drue Second, MD  Fax: [phone]

## 2010-09-28 NOTE — Consult Note (Signed)
NAME:  Brittany Porter, CAPRIO NO.:  [id number]   MEDICAL RECORD NO.:  [id number]          PATIENT TYPE:  OUT   LOCATION:  GYN                          [facility]ACILITY:  [facility]   PHYSICIAN:  De Blanch, M.D.DATE OF BIRTH:  1945/11/07   DATE OF CONSULTATION:  10/10/2008  DATE OF DISCHARGE:                                 CONSULTATION   CHIEF COMPLAINT:  Abdominal pain and pelvic mass.   HISTORY OF PRESENT ILLNESS:  A 65 year old white, married female, seen  in consultation at the request of Dr. Elmon Kirschner regarding new onset of  abdominal pain and findings of a pelvic mass.   The patient had been in her usual state of health until this past  Monday, when she developed acute abdominal pain.  She was seen in the  emergency room at [facility].  Evaluation included a CT scan of the  abdomen and pelvis, which showed moderate amount of ascites, omental  thickening, periumbilical hernia with omental involvement, mild  thickening of the pleural lung bases (possibly related to prior  radiation therapy for breast cancer) and an 11.3 x 13.7 x 10.8-cm pelvic  mass, which is partially cystic with irregular nodular wall, ascites,  and a thickened endometrium.   The patient denies any abdominal distention, bloating, pelvic pressure,  nausea or dyspepsia.  This morning she woke up short of breath without a  cough.  She has no past history of COPD or asthma.  She has no  significant past gynecologic history.   OBSTETRICAL HISTORY:  Gravida 3.   PAST MEDICAL HISTORY:  Bilateral breast cancer, 1997.  This was treated  with bilateral mastectomy, chemotherapy, and radiation therapy.   The patient denies any other medical problems.   CURRENT MEDICATIONS:  Ibuprofen.   DRUG ALLERGIES:  SULFA (eye drops), caused swelling   FAMILY HISTORY:  Sister with colon cancer.  Another sister with breast  cancer.   SOCIAL HISTORY:  The patient is married.  She comes accompanied by her  husband.   They have been married for 46 years.  She does not smoke.   REVIEW OF SYSTEMS:  Ten-point comprehensive review of systems negative,  except as noted above.   PHYSICAL EXAM:  Weight 247 pounds.  GENERAL:  The patient is a pleasant, white female, who is slightly short  of breath.  HEENT:  Negative.  NECK:  Supple without thyromegaly.  There is no supraclavicular or  inguinal adenopathy.  CHEST:  Auscultation of the chest shows decreased breath sounds at the  right base of approximately one half of the lung field, as well as  dullness to percussion in this region.  ABDOMEN:  The abdomen is distended.  There is a fluid wave and shifting  dullness.  No masses were palpable.  PELVIC EXAM:  EG, BUS is normal.  Vagina is normal.  Cervix seems to  have a polyp extruding through it with some bleeding.  Uterus seems to  be mobile.  I am unable to appreciate a pelvic mass, although the  patient's abdomen is distended and she is obese, precluding good  pelvic  examination.  Rectovaginal exam confirms.   PROCEDURE NOTE:  The endocervical polyp is removed with dressing  forceps.  Endometrial biopsy is attempted with a Pipelle although the  Pipelle only passes approximately 3 cm before there seems to be an  obstruction in the cervical canal.  Some tissue and blood is submitted  to pathology.   The patient's CT scan is reviewed.   IMPRESSION:  Complex pelvic mass, thickened endometrial stripe with  postmenopausal bleeding, ascites, and pleural effusion, based on  examination.  The patient appears to have an advanced gynecologic cancer  of either endometrial or ovarian origin.  Certainly, she could have a  carcinosarcoma that is widely metastatic.  Endometrial biopsy is  pending.  In the meantime, I believe the patient would benefit from  surgical exploration, debulking and definition of extent of disease.  She will undergo preoperative workup today.  We will need to evaluate  her pulmonary  function as to whether thoracentesis is needed before  surgery.  At this point in time, I think surgery should be expedited and  we will therefore schedule her to undergo surgery on June 1.  She  understands that Dr. [name] Schimke will be primary gynecologic  oncologist, directing the surgery.  Risks of surgery were reviewed.  All  their questions were answered.      De Blanch, M.D.  Electronically Signed     DC/MEDQ  D:  10/10/2008  T:  10/10/2008  Job:  [id number]   cc:   Telford Nab, R.N.  [address]  [place], [place] [address]   Bethann Berkshire, MD  [address] [place], [place] [address]   Jodie Brovard   Janelle Floor, [place]  Fax: [phone]

## 2010-09-28 NOTE — Consult Note (Signed)
NAME:  Brittany Porter, Brittany Porter                 ACCOUNT NO.:  [id number]   MEDICAL RECORD NO.:  [id number]          PATIENT TYPE:  EMS   LOCATION:  MAJO                         FACILITY:  [facility]   PHYSICIAN:  Clovis Pu. Cornett, M.D.DATE OF BIRTH:  [birth year]   DATE OF CONSULTATION:  10/07/2008  DATE OF DISCHARGE:                                 CONSULTATION   PHYSICIAN REQUESTING CONSULTATION:  Pollyann Savoy, MD, in the  emergency room.   REASON FOR CONSULTATION:  Abdominal pain.   HISTORY OF PRESENT ILLNESS:  The patient is a 65 year old female with a  1-day history of abdominal pain.  The pain started actually today.  It  is lower abdominal pain.  Moderate to severe in intensity.  She has had  some nausea but no vomiting.  She had a bowel movement today.  She was  sent from urgent care to the emergency room for evaluation.  The pain is  located across her lower abdomen.  It started today.  The onset is  gradual and constant.  Currently, it is better than it was early today.  Nothing seems to make it better or worse.  She was evaluated with a CT  scan of her abdomen and pelvis and she was found to have significant  ascites, an umbilical hernia with omentum a large 11-cm right adnexal  mass with omental caking.  She has a remote history of breast cancer  from 1997, status post mastectomy with no evidence of recurrent of this.  I was asked to see her at the request Dr. Bernette Mayers and a PA in the  emergency room.   PAST MEDICAL HISTORY:  Breast cancer in 1997, status post mastectomy.   SOCIAL HISTORY:  Nonsmoker, nondrinker.  Denies tobacco.  Denies drug  use.   ALLERGIES:  SULFA.   MEDICATIONS:  Ibuprofen as needed.   PAST SURGICAL HISTORY:  Please see above.   REVIEW OF SYSTEMS:  Positive for abdominal pain, nausea, vomiting,  fever, otherwise negative x15.   PHYSICAL EXAMINATION:  VITAL SIGNS:  Temperature is 100.6, heart rate  96, and blood pressure 175/59.  GENERAL APPEARANCE:  A  pleasant female in no apparent distress.  HEENT:  No evidence of jaundice.  Oropharynx moist.  NECK:  Supple and nontender.  CHEST:  Lung sounds are clear bilaterally.  Chest wall motion normal.  CARDIOVASCULAR:  Regular rate and rhythm without rub, murmur, or gallop.  ABDOMEN:  Obese with a periumbilical hernia with some mild skin changes.  No signs of peritonitis evidenced.  Significant ascites noted.  EXTREMITIES:  A 1+ pitting edema noted.  Muscle tone is relatively  normal.  She does have some varicose veins.  NEUROLOGIC:  Glasgow Coma is 15.  Motor and sensory functions grossly  intact.   DIAGNOSTIC STUDIES:  White count 12,000, hemoglobin is 15, platelet  count is 223,000.  There is a left shift noted.  CT scan showed  periumbilical hernia with omentum, moderate amount of ascites, omental  thickening, large pelvic mass noted.  There is splenic artery aneurysm  measuring 7.9 mm, possibly involving  the splenic artery.  Pelvic mass is  approximately 11 x 13 cm, solid and cystic.   IMPRESSION:  1. Ascites with large pelvic mass.  2. Small splenic artery aneurysms.  3. Periumbilical hernia with omentum without signs of peritonitis.   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  We recommended a GYN consultation.  Her hernia at this point  in time I think is less of her issues and is not posing major threat  currently.  At this point in time, I recommended GYN consultation,  checking a CA-125 level and a pelvic ultrasound.   Thank you for this consultation.      Bondy A. Cornett, M.D.  Electronically Signed     TAC/MEDQ  D:  10/07/2008  T:  10/08/2008  Job:  [id number]

## 2010-09-28 NOTE — Consult Note (Signed)
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   INTERVAL HISTORY:  The patient underwent a total abdominal hysterectomy  bilateral salpingo-oophorectomy, appendectomy, omentectomy and umbilical  hernia repair on October 14, 2008.  Initial surgery was performed for an  endometrioid carcinoma.  A second separate primary tumor was found on  the right ovary.  The endometrial tumor has invasion of the cervical  stroma (stage II) and the ovarian tumor has service excrescences and  atypical cytology (stage IC).  The patient reports she is having an  uncomplicated postoperative course.  She has previously been scheduled  to start carboplatin and Taxol chemotherapy.  At the completion of that  course we will plan the vaginal vault brachytherapy.   From a surgical point of view, the patient is doing well.  She denies  any GI or GU symptoms has no pelvic pain, pressure vaginal bleeding or  discharge.  Functional status is quite good, given the fact that she is  only 1 week postop.   She has minimal pain.  Functional status is improving.   PHYSICAL EXAM:  VITAL SIGNS:  Weight 227 pounds.  ABDOMEN:  Soft, nontender.  Midline incision is healing well.   IMPRESSION:  Stage IC ovarian cancer, stage II endometrial cancer.   PLAN:  1. The patient has an appointment scheduled on June 30 to see Dr. Welton Flakes      for evaluation of chemotherapy.  I      would recommend she received 6 cycles of carboplatin and Taxol.      Nearing the end of that time we will plan her to undergo vaginal      vault brachytherapy.  She will return for 6-week  postoperative      checkup to see Dr. [name] Schimke, her primary surgeon.      De Blanch, M.D.  Electronically Signed     DC/MEDQ  D:  10/29/2008  T:  10/29/2008  Job:  [id number]   cc:   Telford Nab, R.N.  [address]  [place], [place] [address]   [address]   Drue Second, MD  Fax: [phone]   Billie Lade, M.D.  Fax: [phone]

## 2010-09-28 NOTE — Letter (Signed)
April 13, 2010   [name] Schimke, MD  [address] [place], [place] [address]   Re:  Brittany Porter, Brittany Porter                 DOB:  [birth year]   Dear Dr. Nelly Rout:   I saw the patient back today.  This 65 year old Caucasian female, has  had a history of melanoma removed from her back, bilateral mastectomies  for breast cancer and then ovarian-endometrial cancer.  She had a  hysterectomy with an omentectomy.  She also has some diffuse vague  infiltrations of her liver.  Left pleural effusion developed.  Cytologies were negative on thoracentesis.  She was then put on Lasix.  A CT scan showed some questionable subpleural nodules and on PET scan  they were questionably positive in the pleura.  She is referred here for  evaluation and for VATS, probably pleural biopsy.   Her blood pressure was 140/80, pulse 78, respirations 16, sats were 95%.   PAST MEDICAL HISTORY:   MEDICATIONS:  Iron, simvastatin 40 mg a day, metformin 500 mg twice a  day, fish oil, Meloxicam 7.5 mg a day, and furosemide 20 mg a day.   ALLERGIES:  She has got no allergies.   Diabetes mellitus type 2 and dyslipidemia.   FAMILY HISTORY:  Noncontributory.   SOCIAL HISTORY:  She is married, has 3 children.  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  Does not smoke or drink.   REVIEW OF SYSTEMS:  CONSTITUTIONAL:  She is 232 pounds.  She is 5 feet  4.  GENERAL:  Weight has been stable.  CARDIAC:  No angina or atrial fibrillation.  PULMONARY:  She has no hemoptysis.  GI:  No nausea, vomiting, constipation or diarrhea.  GU:  No kidney disease, dysuria or frequent urination.  VASCULAR:  No claudication, DVT, TIA's.  NEUROLOGIC:  No dizziness headaches, blackouts or seizures.  MUSCULOSKELETAL:  She also has possibly some arthritis and occasionally  gets some arthritic pain when walking.  PSYCHIATRIC:  No depression or nervous.  EYE/ENT:  No change in eyesight or hearing.  HEMATOLOGIC:  No problems with bleeding, clotting  disorders or anemia.   PHYSICAL EXAMINATION:  General:  She is an obese Caucasian female in no  distress.  Head, eyes, ears, nose, and throat:  Unremarkable.  Neck:  Supple without thyromegaly.  There is no supraclavicular or axillary  adenopathy.  Chest:  Clear to auscultation and percussion on the right.  Decreased breath sounds on the left.  Heart:  Regular sinus rhythm.  No  murmur.  Abdomen is obese.  Bowel sounds are normal.  Extremities:  Pulses are 2+.  There is no clubbing or edema.  Neurologic:  She is  oriented x3.  Sensory and motor intact.  Cranial nerves intact.   Ines Bloomer, M.D.  Electronically Signed   DPB/MEDQ  D:  04/13/2010  T:  04/14/2010  Job:  [id number]   cc:   Dario Guardian, M.D.

## 2010-09-28 NOTE — Op Note (Signed)
NAME:  Brittany Porter, Brittany Porter NO.:  [id number]   MEDICAL RECORD NO.:  [id number]          PATIENT TYPE:  INP   LOCATION:  0006                         FACILITY:  [facility]   PHYSICIAN:  [name] Schimke, MD     DATE OF BIRTH:  12/10/1945   DATE OF PROCEDURE:  10/14/2008  DATE OF DISCHARGE:                               OPERATIVE REPORT   PREOPERATIVE DIAGNOSES:  1. Morbid obesity.  2. Endometrial cancer.  3. Pelvic mass.  4. Umbilical hernia.   POSTOPERATIVE DIAGNOSES:  1. Morbid obesity.  2. Endometrial cancer.  3. Ovarian cancer.  4. Umbilical hernia.   PROCEDURES:  1. Total abdominal hysterectomy.  2. Bilateral salpingo-oophorectomy.  3. Omentectomy.  4. Appendectomy.  5. Umbilical hernia repair.   SURGEON:  Ritta Slot, MD.   ASSISTANTS:  1. Sherron Monday, MD.  2. Telford Nab, RN.   ANESTHESIA:  General endotracheal.   INDICATION FOR PROCEDURE:  This is a 65 year old who presented to the  emergency room 1 week ago with acute abdominal pain.  A CT scan noted  the presence of a 13-cm pelvic mass, partially cystic, irregular, with  ascites and a thickened endometrium.  She was referred to GYN oncology,  at which  time she reported uterine bleeding.  An endometrial biopsy was  done, the pathology returned with endometrioid adenocarcinoma.  She also  complained of acute shortness of breath and a chest x-ray was notable  for pleural effusion and 1.4 liters was removed unilaterally.  She was  counseled regarding her options and opted for surgical intervention.   FINDINGS:  On entry into the abdomen a 10-cm globular uterus with an  expanded cervix was appreciated.  Omentum was noted entrapped within the  omental hernia but no gross metastatic disease noted within the omentum.  The appendix was adherent to the pelvic wall with nodules appearing on  the appendiceal serosa.  The left adnexa did not appear involved.  Trace  ascites was noted.  There were no  nodules.  There were no plaques within  the diaphragm or any other evidence of metastatic disease intra-  abdominally.  At the completion of the surgery no disease was visible.   DESCRIPTION OF PROCEDURE:  The patient was taken to the operating room,  placed under general endotracheal anesthesia without any difficulty.  She was then prepped and draped in the usual sterile fashion in the  dorsal lithotomy position.  A left paraumbilical incision was made and  this was continued down with entry into the abdomen.  A portion of the  omentum initially required resection given its entrapment within the  umbilicus.  The patient was morbidly obese and had a very deep pelvis  which made dissection very challenging.  The appendix was noted to be  adherent to the pelvic mass.  The mass was adherent to the mesentery of  the small bowel.  Mass was separated from its attachment to the  mesentery with rupture and spill of contents.  With decompression of the  mass, it was easy to separate the mass from the appendix.  The appendix  was noted to have miliary disease as noted above.  The retroperitoneal  space was entered bilaterally, the infundibulopelvic ligament identified  as were the ureters and the infundibulopelvic ligament clamped, sealed  and transected.  The right adnexa was dissected at the utero-ovarian  ligament and sent for frozen section, it returned evidence of an  adenocarcinoma, possibly endometrial type.  The vesicouterine reflection  was undermined and the bladder dissected off the lower uterine segment.  With use of the LigaSure, the paracervical uterosacral ligaments were  sealed and transected.  With great difficulty, the cuff was  reapproximated using the Berman technique for closure using PDS.  There  was noted to be an extension of the vaginal tear inferiorly.  When  hemostasis could not be assured, this was repaired with running Vicryl  sutures.  Attention was then turned to the  appendix.  The mesentery was  sealed, transected and the appendix transected at its base using a GIA  stapler.  An infragastric omentectomy was performed with use of  LigaSure.  The upper abdomen was explored.  No palpable adenopathy was  appreciated.  Attention was then turned to umbilicus.  A large volume of  tissue was removed from the omentum which appeared necrotic and  hemosiderin-stained.  The hernia sac was removed and the edges freshened  with Bovie cautery.  The edges were reapproximated with interrupted  Prolene sutures.   The abdomen was then copiously irrigated and drained and hemostasis  assured.  The mesentery of the small bowel was noted to be friable at  its prior attachment to the ovary.  FloSeal was placed in this area with  achievement of hemostasis.  FloSeal was also placed within the pelvis  and area of the vaginal cuff.  The fascia was then closed with 0 loop  PDS sutures, mass closure overlapping in the midline.  The subcutaneous  tissues were copiously irrigated and drained and the subcutaneous  tissues reapproximated with running Vicryl suture.  The skin was closed  with 4-0 Vicryl subcuticular suture.   ESTIMATED BLOOD LOSS:  500 mL.   URINE OUTPUT:  300 mL.   SPONGE/INSTRUMENT/NEEDLE COUNT:  Correct x3.   SPECIMENS:  Uterus, ovaries, cervix, fallopian tubes, pelvic washings,  omentum, umbilical hernia sac and appendix.   DISPOSITION:  The patient was extubated and taken to the recovery room  in stable condition.      [name] Schimke, MD  Electronically Signed     WB/MEDQ  D:  10/14/2008  T:  10/14/2008  Job:  [id number]   cc:   Telford Nab, R.N.  [address]  [place], [place] [address]   Bethann Berkshire, MD  [address] [place], [place] [address]   Sherron Monday, MD  Fax: [phone]   Janelle Floor, DO  Fax: [phone]

## 2010-09-28 NOTE — Letter (Signed)
May 19, 2010   Dario Guardian, MD  [address]  [place], [place] [address]   Re:  Brittany Porter, Brittany Porter                 DOB:  1945-07-24   Dear Dr. Katrinka Blazing:   I saw the patient back today and removed her pleural catheter.  Her  chest x-ray was stable and she has no more drainage from her pleural  catheter.  Her blood pressure is 130/67, pulse 80, respirations 18, sats  were 97%.  She is doing well after her surgery and apparently will be  seeing Dr. Welton Flakes to get treatment for her recurrent breast cancer.   Ines Bloomer, M.D.  Electronically Signed   DPB/MEDQ  D:  05/19/2010  T:  05/20/2010  Job:  [id number]

## 2010-09-28 NOTE — Assessment & Plan Note (Signed)
OFFICE VISIT   AMENDA, DUCLOS  DOB:  [birth year]                                        June 02, 2010  CHART #:  [id number]   The patient returns today.  Her blood pressure is 110/70, pulse 81,  respirations 16, sats were 93%.  Chest x-ray shows that she has got no  recurrence of her effusion, just changes from her talc pleurodesis.  Her  incisions are well healed.  He started on chemotherapy by Dr. Carole Binning with  estrogen suppression.  I will see her back again in 6 weeks with a chest  x-ray for a final check.   Ines Bloomer, M.D.  Electronically Signed   DPB/MEDQ  D:  06/02/2010  T:  06/02/2010  Job:  [id number]

## 2010-11-19 ENCOUNTER — Encounter (HOSPITAL_BASED_OUTPATIENT_CLINIC_OR_DEPARTMENT_OTHER): Payer: Medicare Other | Admitting: Oncology

## 2010-11-19 ENCOUNTER — Other Ambulatory Visit: Payer: Self-pay | Admitting: Oncology

## 2010-11-19 DIAGNOSIS — C569 Malignant neoplasm of unspecified ovary: Secondary | ICD-10-CM

## 2010-11-19 DIAGNOSIS — C549 Malignant neoplasm of corpus uteri, unspecified: Secondary | ICD-10-CM

## 2010-11-19 DIAGNOSIS — R7309 Other abnormal glucose: Secondary | ICD-10-CM

## 2010-11-19 DIAGNOSIS — C4359 Malignant melanoma of other part of trunk: Secondary | ICD-10-CM

## 2010-11-19 LAB — CA 125: CA 125: 6.5 U/mL (ref 0.0–30.2)

## 2010-11-19 LAB — COMPREHENSIVE METABOLIC PANEL
ALT: 16 U/L (ref 0–35)
ALT: 16 U/L (ref 0–35)
AST: 19 U/L (ref 0–37)
AST: 19 U/L (ref 0–37)
Albumin: 3.7 g/dL (ref 3.5–5.2)
Albumin: 3.7 g/dL (ref 3.5–5.2)
Alkaline Phosphatase: 56 U/L (ref 39–117)
Alkaline Phosphatase: 56 U/L (ref 39–117)
BUN: 15 mg/dL (ref 6–23)
BUN: 15 mg/dL (ref 6–23)
CO2: 34 mEq/L — ABNORMAL HIGH (ref 19–32)
CO2: 34 mEq/L — ABNORMAL HIGH (ref 19–32)
Calcium: 9.7 mg/dL (ref 8.4–10.5)
Calcium: 9.7 mg/dL (ref 8.4–10.5)
Chloride: 101 mEq/L (ref 96–112)
Chloride: 101 mEq/L (ref 96–112)
Creatinine, Ser: <0.47 mg/dL — ABNORMAL LOW (ref 0.50–1.10)
Creatinine, Ser: <0.47 mg/dL — ABNORMAL LOW (ref 0.50–1.10)
Glucose, Bld: 141 mg/dL — ABNORMAL HIGH (ref 70–99)
Glucose, Bld: 141 mg/dL — ABNORMAL HIGH (ref 70–99)
Potassium: 4.5 mEq/L (ref 3.5–5.3)
Potassium: 4.5 mEq/L (ref 3.5–5.3)
Sodium: 142 mEq/L (ref 135–145)
Sodium: 142 mEq/L (ref 135–145)
Total Bilirubin: 0.6 mg/dL (ref 0.3–1.2)
Total Bilirubin: 0.6 mg/dL (ref 0.3–1.2)
Total Protein: 7 g/dL (ref 6.0–8.3)
Total Protein: 7 g/dL (ref 6.0–8.3)

## 2010-11-19 LAB — CBC WITH DIFFERENTIAL/PLATELET
BASO%: 0.2 % (ref 0.0–2.0)
Basophils Absolute: 0 10*3/uL (ref 0.0–0.1)
EOS%: 2.2 % (ref 0.0–7.0)
Eosinophils Absolute: 0.1 10*3/uL (ref 0.0–0.5)
HCT: 39 % (ref 34.8–46.6)
HGB: 12.8 g/dL (ref 11.6–15.9)
LYMPH%: 27.3 % (ref 14.0–49.7)
MCH: 28.6 pg (ref 25.1–34.0)
MCHC: 32.8 g/dL (ref 31.5–36.0)
MCV: 87.1 fL (ref 79.5–101.0)
MONO#: 0.4 10*3/uL (ref 0.1–0.9)
MONO%: 6.8 % (ref 0.0–14.0)
NEUT#: 3.5 10*3/uL (ref 1.5–6.5)
NEUT%: 63.5 % (ref 38.4–76.8)
Platelets: 153 10*3/uL (ref 145–400)
RBC: 4.48 10*6/uL (ref 3.70–5.45)
RDW: 14.4 % (ref 11.2–14.5)
WBC: 5.4 10*3/uL (ref 3.9–10.3)
lymph#: 1.5 10*3/uL (ref 0.9–3.3)

## 2010-11-23 ENCOUNTER — Encounter (HOSPITAL_BASED_OUTPATIENT_CLINIC_OR_DEPARTMENT_OTHER): Payer: Medicare Other | Admitting: Oncology

## 2010-11-23 ENCOUNTER — Ambulatory Visit
Admission: RE | Admit: 2010-11-23 | Discharge: 2010-11-23 | Disposition: A | Discharge status: Home / Self Care | Payer: Medicare Other | Source: Ambulatory Visit | Attending: Radiation Oncology | Admitting: Radiation Oncology

## 2010-11-23 ENCOUNTER — Other Ambulatory Visit (HOSPITAL_COMMUNITY)
Admission: RE | Admit: 2010-11-23 | Discharge: 2010-11-23 | Disposition: A | Discharge status: Home / Self Care | Payer: Medicare Other | Source: Ambulatory Visit | Attending: Radiation Oncology | Admitting: Radiation Oncology

## 2010-11-23 ENCOUNTER — Other Ambulatory Visit: Payer: Self-pay | Admitting: Radiation Oncology

## 2010-11-23 DIAGNOSIS — C549 Malignant neoplasm of corpus uteri, unspecified: Secondary | ICD-10-CM | POA: Insufficient documentation

## 2010-11-23 DIAGNOSIS — C4359 Malignant melanoma of other part of trunk: Secondary | ICD-10-CM

## 2010-11-23 DIAGNOSIS — C569 Malignant neoplasm of unspecified ovary: Secondary | ICD-10-CM

## 2010-11-23 DIAGNOSIS — Z853 Personal history of malignant neoplasm of breast: Secondary | ICD-10-CM

## 2011-02-24 ENCOUNTER — Ambulatory Visit: Payer: Medicare Other | Attending: Gynecologic Oncology | Admitting: Gynecologic Oncology

## 2011-02-24 DIAGNOSIS — C569 Malignant neoplasm of unspecified ovary: Secondary | ICD-10-CM | POA: Insufficient documentation

## 2011-02-24 DIAGNOSIS — I10 Essential (primary) hypertension: Secondary | ICD-10-CM | POA: Insufficient documentation

## 2011-02-24 DIAGNOSIS — C50919 Malignant neoplasm of unspecified site of unspecified female breast: Secondary | ICD-10-CM | POA: Insufficient documentation

## 2011-02-24 DIAGNOSIS — C549 Malignant neoplasm of corpus uteri, unspecified: Secondary | ICD-10-CM | POA: Insufficient documentation

## 2011-02-24 DIAGNOSIS — Z9221 Personal history of antineoplastic chemotherapy: Secondary | ICD-10-CM | POA: Insufficient documentation

## 2011-02-24 DIAGNOSIS — Z90710 Acquired absence of both cervix and uterus: Secondary | ICD-10-CM | POA: Insufficient documentation

## 2011-02-24 DIAGNOSIS — Z9079 Acquired absence of other genital organ(s): Secondary | ICD-10-CM | POA: Insufficient documentation

## 2011-02-24 DIAGNOSIS — Z85820 Personal history of malignant melanoma of skin: Secondary | ICD-10-CM | POA: Insufficient documentation

## 2011-02-25 NOTE — Consult Note (Signed)
NAME:  Brittany Porter, Brittany Porter NO.:  [id number]  MEDICAL RECORD NO.:  [id number]  LOCATION:  GYN                          FACILITY:  [facility]  PHYSICIAN:  [name] Schimke, MD     DATE OF BIRTH:  22-Apr-1946  DATE OF CONSULTATION: DATE OF DISCHARGE:                                CONSULTATION   REASON FOR VISIT:  Endometrial and ovarian cancer surveillance.  HISTORY OF PRESENT ILLNESS:  This is a 65 year old who on May 16, 2008, underwent operative staging of presumed endometrial adenocarcinoma.  Intraoperative findings were also notable for an endometrioid cancer of the right ovary.  The procedure that performed at that time was a total abdominal hysterectomy, bilateral salpingo- oophorectomy, omentectomy, appendectomy, and umbilical hernia repair. 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  She subsequently received 6 cycles of Taxol and carboplatin therapy.  Last cycle administered in November 2010.  She underwent vaginal cuff brachytherapy for stage II endometrial cancer.  She has completed in January 2011.  Recurrence of her breast cancer was identified earlier this year when she is on letrozole therapy under the care of Dr. Welton Flakes.  PAST MEDICAL HISTORY: 1. Bilateral breast cancer. 2. Recurrent melanoma of the back. 3. Stage IC endometrioid ovarian cancer. 4. Stage II endometrioid endometrial cancer. 5. Hypertension.  PAST SURGICAL HISTORY:  No interval surgeries.  SOCIAL HISTORY:  Brittany Porter and her husband are taking care of their grandson.  Her grandson was recently accepted to gifted academic school and she is entirely thrilled.  FAMILY HISTORY:  Notable for genetic testing with negative BRCA1 and BRCA2 mutations.  REVIEW OF SYSTEMS:  Denies shortness of breath, chest pain.  No abdominal pain or bloating or early satiety.  No bleeding from the vagina, rectum, or bladder.  Stable lower  extremity edema.  Otherwise, 10-point review of systems is negative.  PHYSICAL EXAMINATION:  GENERAL:  A well-developed female in no acute distress. VITAL SIGNS:  Weight 228 pounds, height 5 feet 4 inches, blood pressure 110/68, temperature 98.2. CHEST:  Clear to auscultation. HEART:  Regular rate and rhythm. LYMPH NODE SURVEY:  No cervical, supraclavicular, or inguinal adenopathy. ABDOMEN:  Obese with multiple abdominal incisions. BACK:  No CVA tenderness. PELVIC:  Noted for atrophic vagina without any visible or palpable lesions within the vagina cul-de-sac or pelvis.  Good anal sphincter tone without any masses. EXTREMITIES:  2+ edema bilaterally.  IMPRESSION:  Brittany Porter is without any evidence of disease from her stage II uterine cancer and her stage I ovarian cancer.  She will follow up with Dr. Welton Flakes as recommended.  She will follow up with Dr. Roselind Messier in January 2013, and with GYN Oncology in April 2013.  She has been advised to obtain a flu shot.     [name] Schimke, MD     WB/MEDQ  D:  02/24/2011  T:  02/24/2011  Job:  [id number]  cc:   Telford Nab, R.N. [address] [place], [place] [address]  Drue Second, M.D.  Angelia Mould. Derrell Lolling, M.D. [address], [address] [place] [address]  Billie Lade, Ph.D., M.D. Fax:  [phone]  Dario Guardian, M.D. Fax: [phone]  Electronically Signed by [name] Schimke MD on 02/25/2011 12:11:28 PM

## 2011-03-14 ENCOUNTER — Encounter (HOSPITAL_BASED_OUTPATIENT_CLINIC_OR_DEPARTMENT_OTHER)
Admission: RE | Admit: 2011-03-14 | Discharge: 2011-03-14 | Disposition: A | Discharge status: Home / Self Care | Payer: Medicare Other | Source: Ambulatory Visit | Attending: Orthopaedic Surgery | Admitting: Orthopaedic Surgery

## 2011-03-14 LAB — BASIC METABOLIC PANEL
BUN: 16 mg/dL (ref 6–23)
CO2: 29 mEq/L (ref 19–32)
Calcium: 9.4 mg/dL (ref 8.4–10.5)
Chloride: 103 mEq/L (ref 96–112)
Creatinine, Ser: 0.49 mg/dL — ABNORMAL LOW (ref 0.50–1.10)
GFR calc Af Amer: >90 mL/min (ref >90)
GFR calc non Af Amer: >90 mL/min (ref >90)
Glucose, Bld: 178 mg/dL — ABNORMAL HIGH (ref 70–99)
Potassium: 3.8 mEq/L (ref 3.5–5.1)
Sodium: 143 mEq/L (ref 135–145)

## 2011-03-15 ENCOUNTER — Ambulatory Visit (HOSPITAL_BASED_OUTPATIENT_CLINIC_OR_DEPARTMENT_OTHER): Admission: RE | Admit: 2011-03-15 | Payer: Medicare Other | Source: Ambulatory Visit | Admitting: Orthopaedic Surgery

## 2011-03-15 ENCOUNTER — Ambulatory Visit (HOSPITAL_BASED_OUTPATIENT_CLINIC_OR_DEPARTMENT_OTHER)
Admission: RE | Admit: 2011-03-15 | Discharge: 2011-03-15 | Disposition: A | Discharge status: Home / Self Care | Payer: Medicare Other | Source: Ambulatory Visit | Attending: Orthopaedic Surgery | Admitting: Orthopaedic Surgery

## 2011-03-15 DIAGNOSIS — M1710 Unilateral primary osteoarthritis, unspecified knee: Secondary | ICD-10-CM | POA: Insufficient documentation

## 2011-03-15 DIAGNOSIS — M23329 Other meniscus derangements, posterior horn of medial meniscus, unspecified knee: Secondary | ICD-10-CM | POA: Insufficient documentation

## 2011-03-15 DIAGNOSIS — I10 Essential (primary) hypertension: Secondary | ICD-10-CM | POA: Insufficient documentation

## 2011-03-15 DIAGNOSIS — M23302 Other meniscus derangements, unspecified lateral meniscus, unspecified knee: Secondary | ICD-10-CM | POA: Insufficient documentation

## 2011-03-15 DIAGNOSIS — Z01812 Encounter for preprocedural laboratory examination: Secondary | ICD-10-CM | POA: Insufficient documentation

## 2011-03-15 DIAGNOSIS — E119 Type 2 diabetes mellitus without complications: Secondary | ICD-10-CM | POA: Insufficient documentation

## 2011-03-15 LAB — GLUCOSE, CAPILLARY
Glucose-Capillary: 119 mg/dL — ABNORMAL HIGH (ref 70–99)
Glucose-Capillary: 112 mg/dL — ABNORMAL HIGH (ref 70–99)

## 2011-03-16 LAB — POCT HEMOGLOBIN-HEMACUE
Hemoglobin: 11 g/dL — ABNORMAL LOW (ref 12.0–15.0)
Hemoglobin: 15.8 g/dL — ABNORMAL HIGH (ref 12.0–15.0)

## 2011-03-18 NOTE — Op Note (Signed)
NAME:  Brittany Porter, Brittany Porter                 ACCOUNT NO.:  [id number]  MEDICAL RECORD NO.:  [id number]  LOCATION:                                 FACILITY:  PHYSICIAN:  Lubertha Basque. Monzerrat Wellen, M.D.DATE OF BIRTH:  [birth year]  DATE OF PROCEDURE:  03/15/2011 DATE OF DISCHARGE:                              OPERATIVE REPORT   PREOPERATIVE DIAGNOSES: 1. Right knee degenerative joint disease. 2. Right knee torn medial and torn lateral meniscus.  POSTOPERATIVE DIAGNOSES: 1. Right knee degenerative joint disease. 2. Right knee torn medial and torn lateral meniscus.  PROCEDURES: 1. Right knee abrasion chondroplasty patellofemoral. 2. Right knee partial medial and partial lateral meniscectomy.  ANESTHESIA:  General.  ATTENDING SURGEON:  Lubertha Basque. Jerl Santos, M.D.  ASSISTANT:  Lindwood Qua, P.A.   INDICATION FOR PROCEDURE:  The patient is a 65 year old woman with a long history of a painful right knee.  She is noted to have some significant degenerative change.  She has failed injections and various conservative measures that persist with difficulty.  The ideal procedure will be a knee replacement, but that is nothing she can handle at this point.  She has offered a knee arthroscopy in hopes of ameliorating her condition somewhat and eliminating some of the mechanical symptoms to allow her to get by and remain ambulatory.  At some point, she likely will need a knee replacement.  Informed operative consent was obtained after discussion of possible complications including reaction to anesthesia and infection.  SUMMARY OF FINDINGS AND PROCEDURE:  Under general anesthesia, right knee arthroscopy was performed.  The suprapatellar pouch was benign except for some small cartilaginous loose bodies which were removed.  She had grade 4 degenerative change on both aspects of the patellofemoral joint. I performed abrasion chondroplasty in some small areas, mostly medial. The medial compartment exhibited  some grade 3 change and focal grade 4 change and chondroplasties were done here.  She had a tiny tear of the posterior horn of the medial meniscus addressed with a partial medial meniscectomy.  The ACL was intact.  The lateral compartment exhibited a significant lateral meniscus tear addressed about 10% partial lateral meniscectomy.  There were some grade 3 changes in this compartment as well.  DESCRIPTION OF THE PROCEDURE:  The patient was taken to the operating suite where general anesthetic was applied without difficulty.  She was positioned supine and prepped and draped in normal sterile fashion. After administration of IV Kefzol, an arthroscopy of the right knee was performed through total of 2 portals.  Findings were as noted above. Procedure consisted of the partial medial and partial lateral meniscectomies done with baskets and shavers, followed by abrasion chondroplasty, patellofemoral, and standard chondroplasty medial and lateral.  The knee was thoroughly irrigated followed by placement of Marcaine with epinephrine and morphine plus some Depo-Medrol.  Adaptic was placed over the portals followed by dry gauze, and loose Ace wrap.  Estimated blood loss and intraoperative fluids can be obtained from anesthesia records.  DISPOSITION:  The patient was extubated in the operating room and taken to recovery room in stable condition.  She was to go home same-day, and follow up in the office closely.  I will contact her by phone tonight.     Lubertha Basque Jerl Santos, M.D.     PGD/MEDQ  D:  03/15/2011  T:  03/15/2011  Job:  [id number]  Electronically Signed by Marcene Corning M.D. on 03/18/2011 12:10:21 PM

## 2011-04-21 ENCOUNTER — Telehealth: Payer: Self-pay | Admitting: Oncology

## 2011-04-21 NOTE — Telephone Encounter (Signed)
called pt and scheduled her jan2013 appts per pof 07/10

## 2011-05-18 ENCOUNTER — Other Ambulatory Visit: Payer: Self-pay | Admitting: Oncology

## 2011-05-18 ENCOUNTER — Other Ambulatory Visit: Payer: Self-pay | Admitting: *Deleted

## 2011-05-18 ENCOUNTER — Other Ambulatory Visit (HOSPITAL_BASED_OUTPATIENT_CLINIC_OR_DEPARTMENT_OTHER): Payer: Medicare Other

## 2011-05-18 ENCOUNTER — Ambulatory Visit (HOSPITAL_BASED_OUTPATIENT_CLINIC_OR_DEPARTMENT_OTHER): Payer: Medicare Other | Admitting: Oncology

## 2011-05-18 VITALS — BP 150/79 | HR 78 | Temp 98.6°F | Ht 63.5 in | Wt 229.5 lb

## 2011-05-18 DIAGNOSIS — C4359 Malignant melanoma of other part of trunk: Secondary | ICD-10-CM

## 2011-05-18 DIAGNOSIS — D649 Anemia, unspecified: Secondary | ICD-10-CM

## 2011-05-18 DIAGNOSIS — C569 Malignant neoplasm of unspecified ovary: Secondary | ICD-10-CM

## 2011-05-18 DIAGNOSIS — C50919 Malignant neoplasm of unspecified site of unspecified female breast: Secondary | ICD-10-CM

## 2011-05-18 DIAGNOSIS — Z853 Personal history of malignant neoplasm of breast: Secondary | ICD-10-CM | POA: Diagnosis not present

## 2011-05-18 DIAGNOSIS — C55 Malignant neoplasm of uterus, part unspecified: Secondary | ICD-10-CM

## 2011-05-18 LAB — COMPREHENSIVE METABOLIC PANEL
ALT: 18 U/L (ref 0–35)
AST: 20 U/L (ref 0–37)
Albumin: 4.4 g/dL (ref 3.5–5.2)
Alkaline Phosphatase: 41 U/L (ref 39–117)
BUN: 19 mg/dL (ref 6–23)
CO2: 29 mEq/L (ref 19–32)
Calcium: 9.3 mg/dL (ref 8.4–10.5)
Chloride: 102 mEq/L (ref 96–112)
Creatinine, Ser: 0.57 mg/dL (ref 0.50–1.10)
Glucose, Bld: 166 mg/dL — ABNORMAL HIGH (ref 70–99)
Potassium: 4.7 mEq/L (ref 3.5–5.3)
Sodium: 142 mEq/L (ref 135–145)
Total Bilirubin: 0.7 mg/dL (ref 0.3–1.2)
Total Protein: 6.3 g/dL (ref 6.0–8.3)

## 2011-05-18 LAB — CBC WITH DIFFERENTIAL/PLATELET
BASO%: 0.3 % (ref 0.0–2.0)
Basophils Absolute: 0 10*3/uL (ref 0.0–0.1)
EOS%: 1.9 % (ref 0.0–7.0)
Eosinophils Absolute: 0.1 10*3/uL (ref 0.0–0.5)
HCT: 39.1 % (ref 34.8–46.6)
HGB: 13.3 g/dL (ref 11.6–15.9)
LYMPH%: 28.8 % (ref 14.0–49.7)
MCH: 30.1 pg (ref 25.1–34.0)
MCHC: 33.9 g/dL (ref 31.5–36.0)
MCV: 88.7 fL (ref 79.5–101.0)
MONO#: 0.4 10*3/uL (ref 0.1–0.9)
MONO%: 7.5 % (ref 0.0–14.0)
NEUT#: 3.5 10*3/uL (ref 1.5–6.5)
NEUT%: 61.5 % (ref 38.4–76.8)
Platelets: 155 10*3/uL (ref 145–400)
RBC: 4.41 10*6/uL (ref 3.70–5.45)
RDW: 14.2 % (ref 11.2–14.5)
WBC: 5.7 10*3/uL (ref 3.9–10.3)
lymph#: 1.6 10*3/uL (ref 0.9–3.3)

## 2011-05-18 MED ORDER — LETROZOLE 2.5 MG PO TABS: 2.5000 mg | ORAL_TABLET | Freq: Every day | ORAL | Status: AC

## 2011-05-18 NOTE — Progress Notes (Signed)
OFFICE PROGRESS NOTE  CC  Dr. Claud Kelp Dr. Antony Blackbird, Dr. Merri Brunette  DIAGNOSIS: 66 year old female with  #1 stage II endometrial carcinoma and stage I ovarian carcinoma diagnosed in June 2010.  #2 malignant melanoma diagnosed August 2010 status post wide local excision. 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  PRIOR THERAPY:please see previously dictated note  CURRENT THERAPY:letrozole 2.5 mg daily since January 2012  INTERVAL HISTORY: Brittany Porter 66 y.o. female returns for followup visit today. Overall she is doing well. Her main complaint is hot flashes and some joint achiness. She otherwise denies any fevers chills night sweats headaches shortness of breath chest pains palpitations. Patient does tell me recently she had arthroscopic surgery performed  of her right knee and she is healing from this slowly. She has not noticed any hematuria vaginal discharge or bleeding.remainder of the 10 point review of systems is negative per  MEDICAL HISTORY:iron deficiency hyperlipidemia diabetesfluid overall  ALLERGIES:  is allergic to sulfa antibiotics.  MEDICATIONS:  Current Outpatient Prescriptions  Medication Sig Dispense Refill  . calcium-vitamin D (OSCAL WITH D) 500-200 MG-UNIT per tablet Take 1 tablet by mouth daily.        . cholecalciferol (VITAMIN D) 400 UNITS TABS Take 2,000 Units by mouth daily.        . ferrous sulfate 325 (65 FE) MG tablet Take 325 mg by mouth daily with breakfast.        . fish oil-omega-3 fatty acids 1000 MG capsule Take 2 g by mouth daily.        . furosemide (LASIX) 20 MG tablet Take 20 mg by mouth 2 (two) times daily.        Marland Kitchen HYDROcodone-acetaminophen (NORCO) 5-325 MG per tablet Take 1 tablet by mouth every 6 (six) hours as needed.        Marland Kitchen letrozole (FEMARA) 2.5 MG tablet Take 2.5 mg by mouth daily.        . meloxicam (MOBIC) 7.5 MG tablet  Take 7.5 mg by mouth daily.        . metFORMIN (GLUCOPHAGE) 500 MG tablet Take 500 mg by mouth 2 (two) times daily with a meal.        . simvastatin (ZOCOR) 40 MG tablet Take 40 mg by mouth at bedtime.        Marland Kitchen letrozole (FEMARA) 2.5 MG tablet Take 1 tablet (2.5 mg total) by mouth daily.  90 tablet  12    SURGICAL HISTORY:   #1 status post bilateral mastectomies  #2 status post bilateral oophorectomies and hysterectomy.  #3 status post wide local excision of malignant melanoma of the back. REVIEW OF SYSTEMS:  Pertinent items are noted in HPI.   PHYSICAL EXAMINATION: General appearance: alert, cooperative and appears stated age Head: Normocephalic, without obvious abnormality, atraumatic Neck: no adenopathy, no carotid bruit, no JVD, supple, symmetrical, trachea midline and thyroid not enlarged, symmetric, no tenderness/mass/nodules Lymph nodes: Cervical, supraclavicular, and axillary nodes normal. Resp: clear to auscultation bilaterally and normal percussion bilaterally Back: symmetric, no curvature. ROM normal. No CVA tenderness. Cardio: regular rate and rhythm, S1, S2 normal, no murmur, click, rub or gallop and normal apical impulse GI: soft, non-tender; bowel sounds normal; no masses,  no organomegaly Extremities: extremities normal, atraumatic, no cyanosis or edema Neurologic: Alert and oriented X 3, normal strength and tone. Normal symmetric reflexes. Normal coordination and gait Bilateral mastectomy scars looks well healed  there is no nodularity  or masses ECOG PERFORMANCE STATUS: 1 - Symptomatic but completely ambulatory  Blood pressure 150/79, pulse 78, temperature 98.6 F (37 C), temperature source Oral, height 5' 3.5" (1.613 m), weight 229 lb 8 oz (104.101 kg).  LABORATORY DATA: Lab Results  Component Value Date   WBC 5.7 05/18/2011   HGB 13.3 05/18/2011   HCT 39.1 05/18/2011   MCV 88.7 05/18/2011   PLT 155 05/18/2011      Chemistry      Component Value Date/Time   NA 143  03/14/2011 0912   NA 140 11/17/2009 1136   K 3.8 03/14/2011 0912   K 4.2 11/17/2009 1136   CL 103 03/14/2011 0912   CL 103 11/17/2009 1136   CO2 29 03/14/2011 0912   CO2 28 11/17/2009 1136   BUN 16 03/14/2011 0912   BUN 13 11/17/2009 1136   CREATININE 0.49* 03/14/2011 0912   CREATININE 0.6 11/17/2009 1136      Component Value Date/Time   CALCIUM 9.4 03/14/2011 0912   CALCIUM 9.7 11/17/2009 1136   ALKPHOS 56 11/19/2010 1107   ALKPHOS 56 11/19/2010 1107   ALKPHOS 60 11/17/2009 1136   AST 19 11/19/2010 1107   AST 19 11/19/2010 1107   AST 24 11/17/2009 1136   ALT 16 11/19/2010 1107   ALT 16 11/19/2010 1107   BILITOT 0.6 11/19/2010 1107   BILITOT 0.6 11/19/2010 1107   BILITOT 1.00 11/17/2009 1136       RADIOGRAPHIC STUDIES:  No results found.  ASSESSMENT: 66 year old female with 3 primary malignancies including stage II endometrial carcinoma and stage I ovarian carcinoma diagnosed in June 2010. She was treated aggressively with chemotherapy and radiation. Patient also had a malignant melanoma and she underwent a wide local excision in August 2010. In December 2011 patient developed recurrence of her breast cancer and she was subsequently started on letrozole 2.5 mg daily starting in January 2012. Overall patient is doing remarkably well and we will continue her present treatment.   PLAN: continue letrozole 2.5 mg daily. She will continue to followup with Dr. [name] Schimke as well as Dr. Derrell Lolling and Dr. Antony Blackbird.   All questions were answered. The patient knows to call the clinic with any problems, questions or concerns. We can certainly see the patient much sooner if necessary.  I spent 20 minutes counseling the patient face to face. The total time spent in the appointment was 30 minutes.    Drue Second, MD Medical/Oncology [facility] [phone] (beeper) [phone] (Office)  05/18/2011, 1:47 PM

## 2011-05-23 ENCOUNTER — Ambulatory Visit: Payer: Medicare Other | Admitting: Radiation Oncology

## 2011-05-23 DIAGNOSIS — Z85820 Personal history of malignant melanoma of skin: Secondary | ICD-10-CM | POA: Diagnosis not present

## 2011-05-23 DIAGNOSIS — D235 Other benign neoplasm of skin of trunk: Secondary | ICD-10-CM | POA: Diagnosis not present

## 2011-05-23 DIAGNOSIS — B351 Tinea unguium: Secondary | ICD-10-CM | POA: Diagnosis not present

## 2011-05-24 DIAGNOSIS — M2240 Chondromalacia patellae, unspecified knee: Secondary | ICD-10-CM | POA: Diagnosis not present

## 2011-05-24 DIAGNOSIS — IMO0002 Reserved for concepts with insufficient information to code with codable children: Secondary | ICD-10-CM | POA: Diagnosis not present

## 2011-05-25 DIAGNOSIS — M2240 Chondromalacia patellae, unspecified knee: Secondary | ICD-10-CM | POA: Diagnosis not present

## 2011-05-31 DIAGNOSIS — IMO0002 Reserved for concepts with insufficient information to code with codable children: Secondary | ICD-10-CM | POA: Diagnosis not present

## 2011-05-31 DIAGNOSIS — M2240 Chondromalacia patellae, unspecified knee: Secondary | ICD-10-CM | POA: Diagnosis not present

## 2011-06-02 DIAGNOSIS — IMO0002 Reserved for concepts with insufficient information to code with codable children: Secondary | ICD-10-CM | POA: Diagnosis not present

## 2011-06-06 ENCOUNTER — Ambulatory Visit: Payer: Medicare Other | Admitting: Radiation Oncology

## 2011-06-08 DIAGNOSIS — IMO0002 Reserved for concepts with insufficient information to code with codable children: Secondary | ICD-10-CM | POA: Diagnosis not present

## 2011-06-10 DIAGNOSIS — IMO0002 Reserved for concepts with insufficient information to code with codable children: Secondary | ICD-10-CM | POA: Diagnosis not present

## 2011-06-10 DIAGNOSIS — M2240 Chondromalacia patellae, unspecified knee: Secondary | ICD-10-CM | POA: Diagnosis not present

## 2011-06-15 DIAGNOSIS — M1710 Unilateral primary osteoarthritis, unspecified knee: Secondary | ICD-10-CM | POA: Diagnosis not present

## 2011-06-16 DIAGNOSIS — M2240 Chondromalacia patellae, unspecified knee: Secondary | ICD-10-CM | POA: Diagnosis not present

## 2011-06-21 DIAGNOSIS — M1710 Unilateral primary osteoarthritis, unspecified knee: Secondary | ICD-10-CM | POA: Diagnosis not present

## 2011-06-24 DIAGNOSIS — M1710 Unilateral primary osteoarthritis, unspecified knee: Secondary | ICD-10-CM | POA: Diagnosis not present

## 2011-06-29 DIAGNOSIS — M545 Low back pain: Secondary | ICD-10-CM | POA: Diagnosis not present

## 2011-06-29 DIAGNOSIS — M1710 Unilateral primary osteoarthritis, unspecified knee: Secondary | ICD-10-CM | POA: Diagnosis not present

## 2011-06-29 DIAGNOSIS — M2240 Chondromalacia patellae, unspecified knee: Secondary | ICD-10-CM | POA: Diagnosis not present

## 2011-07-04 DIAGNOSIS — M1710 Unilateral primary osteoarthritis, unspecified knee: Secondary | ICD-10-CM | POA: Diagnosis not present

## 2011-07-04 DIAGNOSIS — E7800 Pure hypercholesterolemia, unspecified: Secondary | ICD-10-CM | POA: Diagnosis not present

## 2011-07-04 DIAGNOSIS — M2240 Chondromalacia patellae, unspecified knee: Secondary | ICD-10-CM | POA: Diagnosis not present

## 2011-07-04 DIAGNOSIS — Z23 Encounter for immunization: Secondary | ICD-10-CM | POA: Diagnosis not present

## 2011-07-04 DIAGNOSIS — E119 Type 2 diabetes mellitus without complications: Secondary | ICD-10-CM | POA: Diagnosis not present

## 2011-07-06 DIAGNOSIS — M2240 Chondromalacia patellae, unspecified knee: Secondary | ICD-10-CM | POA: Diagnosis not present

## 2011-07-12 DIAGNOSIS — M2240 Chondromalacia patellae, unspecified knee: Secondary | ICD-10-CM | POA: Diagnosis not present

## 2011-07-13 DIAGNOSIS — M2240 Chondromalacia patellae, unspecified knee: Secondary | ICD-10-CM | POA: Diagnosis not present

## 2011-07-13 DIAGNOSIS — M1710 Unilateral primary osteoarthritis, unspecified knee: Secondary | ICD-10-CM | POA: Diagnosis not present

## 2011-07-19 DIAGNOSIS — M2240 Chondromalacia patellae, unspecified knee: Secondary | ICD-10-CM | POA: Diagnosis not present

## 2011-07-21 DIAGNOSIS — M1710 Unilateral primary osteoarthritis, unspecified knee: Secondary | ICD-10-CM | POA: Diagnosis not present

## 2011-07-21 DIAGNOSIS — M2240 Chondromalacia patellae, unspecified knee: Secondary | ICD-10-CM | POA: Diagnosis not present

## 2011-07-27 DIAGNOSIS — M2240 Chondromalacia patellae, unspecified knee: Secondary | ICD-10-CM | POA: Diagnosis not present

## 2011-07-27 DIAGNOSIS — M1710 Unilateral primary osteoarthritis, unspecified knee: Secondary | ICD-10-CM | POA: Diagnosis not present

## 2011-07-28 DIAGNOSIS — M2240 Chondromalacia patellae, unspecified knee: Secondary | ICD-10-CM | POA: Diagnosis not present

## 2011-08-09 ENCOUNTER — Ambulatory Visit: Payer: Medicare Other | Attending: Gynecologic Oncology | Admitting: Gynecologic Oncology

## 2011-08-09 ENCOUNTER — Encounter: Payer: Self-pay | Admitting: Gynecologic Oncology

## 2011-08-09 VITALS — BP 132/58 | HR 68 | Temp 98.2°F | Resp 14 | Ht 64.0 in | Wt 229.7 lb

## 2011-08-09 DIAGNOSIS — C549 Malignant neoplasm of corpus uteri, unspecified: Secondary | ICD-10-CM | POA: Insufficient documentation

## 2011-08-09 DIAGNOSIS — B372 Candidiasis of skin and nail: Secondary | ICD-10-CM | POA: Diagnosis not present

## 2011-08-09 DIAGNOSIS — C569 Malignant neoplasm of unspecified ovary: Secondary | ICD-10-CM | POA: Insufficient documentation

## 2011-08-09 DIAGNOSIS — C55 Malignant neoplasm of uterus, part unspecified: Secondary | ICD-10-CM

## 2011-08-09 NOTE — Progress Notes (Signed)
Follow-up Note: Gyn-Onc   Brittany Porter 66 y.o. female  CC:  Chief Complaint  Patient presents with  . Ovarian/Endo ca    Follow up    HPI:   Endometrial and ovarian cancer surveillance.   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 Intraoperative findings were also notable for an  endometrioid cancer of the right ovary. The procedure that performed at  that time was a total abdominal hysterectomy, bilateral salpingo-  oophorectomy, omentectomy, appendectomy, and umbilical hernia repair.  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 She subsequently received 6  cycles of Taxol and carboplatin therapy. Last cycle administered in  November 2010. She underwent vaginal cuff brachytherapy for stage II  endometrial cancer. She has completed in January 2011. Recurrence of  her breast cancer was identified earlier this year and she is on  letrozole therapy under the care of Dr. Welton Flakes.  Interval History: Patient denies any vaginal bleeding headache shortness of breath nausea vomiting bleeding from the rectum or the vagina Pap test in June of 2012 within normal limits  Current Meds:  Outpatient Encounter Prescriptions as of 08/09/2011  Medication Sig Dispense Refill  . calcium-vitamin D (OSCAL WITH D) 500-200 MG-UNIT per tablet Take 1 tablet by mouth 2 (two) times daily.       . cholecalciferol (VITAMIN D) 400 UNITS TABS Take 2,000 Units by mouth daily.        . ferrous sulfate 325 (65 FE) MG tablet Take 325 mg by mouth daily with breakfast.        . fish oil-omega-3 fatty acids 1000 MG capsule Take 2 g by mouth daily.        Marland Kitchen FLUCONAZOLE PO Take by mouth 3 (three) times a week.      . furosemide (LASIX) 20 MG tablet Take 20 mg by mouth daily.       Marland Kitchen HYDROcodone-acetaminophen (NORCO) 5-325 MG per tablet Take 1 tablet by mouth every 6 (six) hours as  needed.        Marland Kitchen letrozole (FEMARA) 2.5 MG tablet Take 2.5 mg by mouth daily.        . meloxicam (MOBIC) 7.5 MG tablet Take 7.5 mg by mouth daily.        . metFORMIN (GLUCOPHAGE) 500 MG tablet Take 500 mg by mouth 2 (two) times daily with a meal.        . simvastatin (ZOCOR) 40 MG tablet Take 40 mg by mouth at bedtime.          Allergy:  Allergies  Allergen Reactions  . Sulfa Antibiotics     "Eyes swelled shut"    Social Hx:   History   Social History  . Marital Status: Married    Spouse Name: N/A    Number of Children: N/A  . Years of Education: N/A   Occupational History  . Not on file.   Social History Main Topics  . Smoking status: Never Smoker   . Smokeless tobacco: Not on file  . Alcohol Use: No  . Drug Use: No  . Sexually Active: Not on file   Other Topics Concern  . Not on file   Social History Narrative  . No narrative on file    Past Surgical Hx:  Past Surgical History  Procedure Date  . Mastectomy 1997    Bilateral  . Abdominal hysterectomy 10/2008  .  Melanoma removal 02/2009, 07/2009  . Knee arthroscopy 04/2010    Right knee    Past Medical Hx:  Past Medical History  Diagnosis Date  . Pleural effusion 04/2010  . Breast cancer   . Ovarian cancer   . Endometrial carcinoma 10/2008  . Iron deficiency   . Diabetes mellitus   . Hyperlipidemia   . Fluid overload     Family Hx:  Family History  Problem Relation Age of Onset  . Stroke Mother   . Cancer Father   . Colon cancer Sister   . Stroke Sister   . Heart attack Brother   . Skin cancer Brother     Review of Systems:  Constitutional  Feels well Skin Candidiasis of the inguinal area mons and vulva began yesterday. Symptomatology is improving with application of nystatin and uses ketoconazole  Cardiovascular  No chest pain, shortness of breath, or edema  Pulmonary  No cough or wheeze.  Gastro Intestinal  No nausea, vomitting, or diarrhoea. No bright red blood per rectum, no  abdominal pain, change in bowel movement, or constipation.  Genito Urinary  No frequency, urgency, dysuria,  Musculo Skeletal  No myalgia, arthralgia, joint swelling or pain  Neurologic  No weakness, numbness, change in gait,  Psychology  No depression, anxiety, insomnia.     Vitals:  Blood pressure 132/58, pulse 68, temperature 98.2 F (36.8 C), temperature source Oral, resp. rate 14, height 5\' 4"  (1.626 m), weight 229 lb 11.2 oz (104.191 kg).  Physical Exam: WD female in no acute distress  Neck  Supple without any enlargements.  Lymph node survey. No cervical supraclavicular cervical or inguinal adenopathy Cardiovascular  Pulse normal rate, regularity and rhythm. S1 and S2 normal. Lungs  Clear to auscultation bilateraly, without wheezes/crackles/rhonchi. Good air movement.  Skin  Erythema consistent with candidiasis of the inguinal folds labial folds and the mons. No evidence of skin breakdown  Psychiatry  Alert and oriented to person, place, and time  Back No CVA tenderness Abdomen  Lower bowel sounds, abdomen soft, non-tender and obese. Surgical  sites intact without evidence of hernia. No evidence of a fluid wave or abdominal masses Genito Urinary  Vulva/vagina: Normal external female genitalia.  No lesions.   Bladder/urethra:  No lesions or masses  Vagina: Atrophic without any lesions. No palpable masses Rectal  Good tone, no masses no cul de sac nodularity.  Extremities  No bilateral cyanosis, clubbing or edema.    Assessment/Plan:  This is a 66 y.o. year old  who underwent staging for a stage II endometrial cancer and stage I ovarian cancer in June of 2010. The patient completed 6 cycles of Taxol and carboplatin therapy in November of 2010. She completed vaginal cuff brachytherapy in January 2011.  She's been without any evidence of disease since. I've advised her to followup with Dr. Roselind Messier in June of 2013. She'll follow up with the GYN oncology service in  December of 2013.  Skin candidiasis Patient states that her fasting blood sugars are approximately 1:15 AM the morning. She's been advised that better glucose control may be associated with less frequent candidiasis flares   Brittany Porter Schimke, MD., PhD. 08/09/2011, 9:35 AM

## 2011-08-09 NOTE — Patient Instructions (Signed)
Without any evidence of disease since. I've advised her to followup with Dr. Roselind Messier in June of 2013. She'll follow up with the GYN oncology service in December of 2013.

## 2011-08-10 DIAGNOSIS — M2240 Chondromalacia patellae, unspecified knee: Secondary | ICD-10-CM | POA: Diagnosis not present

## 2011-09-19 ENCOUNTER — Ambulatory Visit: Payer: Medicare Other | Admitting: Radiation Oncology

## 2011-09-22 ENCOUNTER — Encounter (INDEPENDENT_AMBULATORY_CARE_PROVIDER_SITE_OTHER): Payer: Self-pay | Admitting: General Surgery

## 2011-09-22 ENCOUNTER — Ambulatory Visit (INDEPENDENT_AMBULATORY_CARE_PROVIDER_SITE_OTHER): Payer: Medicare Other | Admitting: General Surgery

## 2011-09-22 VITALS — BP 110/84 | HR 62 | Temp 96.6°F | Ht 64.0 in | Wt 230.6 lb

## 2011-09-22 DIAGNOSIS — C4359 Malignant melanoma of other part of trunk: Secondary | ICD-10-CM | POA: Diagnosis not present

## 2011-09-22 NOTE — Progress Notes (Signed)
Patient ID: Brittany Porter, female   DOB: 1946-05-11, 65 y.o.   MRN: [id number]  Chief Complaint  Patient presents with  . Breast Cancer Long Term Follow Up    yrly br ck    HPI Brittany Porter is a 66 y.o. female.  She returns for long-term followup for melanoma and breast cancer.  This patient underwent wide local excision of a melanoma of the back with a 7 cm defect, Keystone advancement flap, and left supraclavicular sentinel node biopsy on February 23, 2009. She has no evidence of recurrence to date. She sees Dr. Para Porter every 6 months. She is followed by the Brittany Porter and Dr. [name] Porter because of breast cancer and endometrial cancer. She states that she has no evidence of recurrent endometrial cancer.  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   Her health has remained stable. She has minimal symptoms. HPI  Past Medical History  Diagnosis Date  . Pleural effusion 04/2010  . Breast cancer   . Ovarian cancer   . Endometrial carcinoma 10/2008  . Iron deficiency   . Diabetes mellitus   . Hyperlipidemia   . Fluid overload   . Arthritis     Past Surgical History  Procedure Date  . Mastectomy 1997    Bilateral  . Abdominal hysterectomy 10/2008  . Melanoma removal 02/2009, 07/2009  . Knee arthroscopy 04/2010    Right knee  . Breast surgery 04/04/96    bil masty    Family History  Problem Relation Age of Onset  . Stroke Mother   . Cancer Father   . Colon cancer Sister   . Stroke Sister   . Cancer Sister     breast  . Heart attack Brother   . Cancer Brother     skin  . Skin cancer Brother   . Cancer Sister     colon    Social History History  Substance Use Topics  . Smoking status: Never Smoker   . Smokeless tobacco: Not on file  . Alcohol Use: No    Allergies  Allergen Reactions  . Sulfa Antibiotics     "Eyes swelled shut"    Current Outpatient Prescriptions    Medication Sig Dispense Refill  . Calcium Carbonate-Vitamin D (CALCIUM 600+D) 600-400 MG-UNIT per tablet Take 2 tablets by mouth daily.      . Cholecalciferol (VITAMIN D3) 2000 UNITS TABS Take 2,000 mg by mouth daily.      . ferrous sulfate 325 (65 FE) MG tablet Take 325 mg by mouth daily with breakfast.        . fish oil-omega-3 fatty acids 1000 MG capsule Take 2 g by mouth daily.        . furosemide (LASIX) 20 MG tablet Take 20 mg by mouth daily.       Marland Kitchen HYDROcodone-acetaminophen (NORCO) 5-325 MG per tablet Take 1 tablet by mouth every 6 (six) hours as needed.        Marland Kitchen letrozole (FEMARA) 2.5 MG tablet Take 2.5 mg by mouth daily.        . meloxicam (MOBIC) 7.5 MG tablet Take 15 mg by mouth daily.       . metFORMIN (GLUCOPHAGE) 500 MG tablet Take 500 mg by mouth 2 (two) times daily with a meal.        . prochlorperazine (COMPAZINE) 10 MG tablet Take 10 mg by mouth as needed.      Marland Kitchen  simvastatin (ZOCOR) 40 MG tablet Take 40 mg by mouth at bedtime.          Review of Systems Review of Systems  Constitutional: Negative for fever, chills and unexpected weight change.  HENT: Negative for hearing loss, congestion, sore throat, trouble swallowing and voice change.   Eyes: Negative for visual disturbance.  Respiratory: Positive for chest tightness. Negative for cough and wheezing.   Cardiovascular: Negative for chest pain, palpitations and leg swelling.  Gastrointestinal: Negative for nausea, vomiting, abdominal pain, diarrhea, constipation, blood in stool, abdominal distention and anal bleeding.  Genitourinary: Negative for hematuria, vaginal bleeding and difficulty urinating.  Musculoskeletal: Negative for arthralgias.  Skin: Negative for rash and wound.  Neurological: Negative for seizures, syncope and headaches.  Hematological: Negative for adenopathy. Does not bruise/bleed easily.  Psychiatric/Behavioral: Negative for confusion.    Blood pressure 110/84, pulse 62, temperature 96.6 F  (35.9 C), temperature source Temporal, height 5\' 4"  (1.626 m), weight 230 lb 9.6 oz (104.599 kg), SpO2 98.00%.  Physical Exam Physical Exam  Constitutional: She is oriented to person, place, and time. She appears well-developed and well-nourished. No distress.  HENT:  Head: Normocephalic and atraumatic.  Nose: Nose normal.  Mouth/Throat: No oropharyngeal exudate.  Eyes: Conjunctivae and EOM are normal. Pupils are equal, round, and reactive to light. Left eye exhibits no discharge. No scleral icterus.  Neck: Neck supple. No JVD present. No tracheal deviation present. No thyromegaly present.  Cardiovascular: Normal rate, regular rhythm, normal heart sounds and intact distal pulses.   No murmur heard. Pulmonary/Chest: Effort normal and breath sounds normal. No respiratory distress. She has no wheezes. She has no rales. She exhibits no tenderness.       Bilateral mastectomy scars are healed. No nodules or ulceration. No axillary adenopathy. No supraclavicular adenopathy.  Abdominal: Soft. Bowel sounds are normal. She exhibits no distension and no mass. There is no tenderness. There is no rebound and no guarding.  Musculoskeletal: She exhibits no edema and no tenderness.  Lymphadenopathy:    She has no cervical adenopathy.  Neurological: She is alert and oriented to person, place, and time. She exhibits normal muscle tone. Coordination normal.  Skin: Skin is warm. No rash noted. She is not diaphoretic. No erythema. No pallor.       All neck incisions are well-healed. No neck adenopathy. Large scar upper central back well healed. No palpable mass. No satellite nodules or suspicious skin lesions.  Psychiatric: She has a normal mood and affect. Her behavior is normal. Judgment and thought content normal.    Data Reviewed I reviewed records from [facility]. I reviewed all of my old records.  Assessment    Malignant melanoma, upper back, superficial spreading type, pathologic  stage T3b, N0. No evidence of recurrence 2-1/2 years following wide local excision and sentinel node biopsy.  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 No recurrence.  Remote history bilateral modified radical mastectomy by Dr. Rolene Course. No evidence of local recurrence.  Status post hysterectomy, BSO, and omentectomy and post op radiation therapy of endometrial carcinoma  S/P  Port-A-Cath insertion.    Plan    Keep regular followup every 6 months with Dr. Para Porter and I will see her back in one year for a check for local and regional recurrence.  Continue close clinical followup and Brittany Porter who is managing her letrozole  Continue close clinical followup and Brittany Porter regarding her endometrial  cancer surveillance.       Angelia Mould. Derrell Lolling, M.D., [facility] General and Minimally invasive Surgery Breast and Colorectal Surgery Office:   [phone] Pager:   [phone]  09/22/2011, 9:57 AM

## 2011-09-22 NOTE — Patient Instructions (Signed)
Your examination today is normal. There is no evidence of recurrent melanoma and there is no evidence of recurrent breast cancer.  Keep your regular appointments with Dr. Drue Second and Dr. [name] Schimke. See Dr. Wyn Forster every 6 months  Return to see Dr. Derrell Lolling in one year.

## 2011-10-03 ENCOUNTER — Ambulatory Visit: Payer: Medicare Other | Admitting: Radiation Oncology

## 2011-10-31 ENCOUNTER — Ambulatory Visit: Payer: Medicare Other | Admitting: Radiation Oncology

## 2011-11-16 ENCOUNTER — Ambulatory Visit: Payer: Medicare Other | Admitting: Radiation Oncology

## 2011-11-16 DIAGNOSIS — M25579 Pain in unspecified ankle and joints of unspecified foot: Secondary | ICD-10-CM | POA: Diagnosis not present

## 2011-11-29 ENCOUNTER — Other Ambulatory Visit (HOSPITAL_COMMUNITY)
Admission: RE | Admit: 2011-11-29 | Discharge: 2011-11-29 | Disposition: A | Discharge status: Home / Self Care | Payer: Medicare Other | Source: Ambulatory Visit | Attending: Radiation Oncology | Admitting: Radiation Oncology

## 2011-11-29 ENCOUNTER — Ambulatory Visit
Admission: RE | Admit: 2011-11-29 | Discharge: 2011-11-29 | Disposition: A | Discharge status: Home / Self Care | Payer: Medicare Other | Source: Ambulatory Visit | Attending: Radiation Oncology | Admitting: Radiation Oncology

## 2011-11-29 ENCOUNTER — Encounter: Payer: Self-pay | Admitting: Radiation Oncology

## 2011-11-29 VITALS — BP 141/83 | HR 73 | Temp 98.6°F | Resp 18 | Wt 231.6 lb

## 2011-11-29 DIAGNOSIS — D485 Neoplasm of uncertain behavior of skin: Secondary | ICD-10-CM | POA: Diagnosis not present

## 2011-11-29 DIAGNOSIS — C549 Malignant neoplasm of corpus uteri, unspecified: Secondary | ICD-10-CM | POA: Diagnosis not present

## 2011-11-29 DIAGNOSIS — D1801 Hemangioma of skin and subcutaneous tissue: Secondary | ICD-10-CM | POA: Diagnosis not present

## 2011-11-29 DIAGNOSIS — Z124 Encounter for screening for malignant neoplasm of cervix: Secondary | ICD-10-CM | POA: Diagnosis not present

## 2011-11-29 DIAGNOSIS — C55 Malignant neoplasm of uterus, part unspecified: Secondary | ICD-10-CM

## 2011-11-29 DIAGNOSIS — Z85820 Personal history of malignant melanoma of skin: Secondary | ICD-10-CM | POA: Diagnosis not present

## 2011-11-29 DIAGNOSIS — E7800 Pure hypercholesterolemia, unspecified: Secondary | ICD-10-CM | POA: Diagnosis not present

## 2011-11-29 DIAGNOSIS — D235 Other benign neoplasm of skin of trunk: Secondary | ICD-10-CM | POA: Diagnosis not present

## 2011-11-29 NOTE — Progress Notes (Signed)
HERE TODAY FOR FU OF ENDOMETRIAL.  NO PAIN, NO DISCHARGE.  SAYS SKIN IS DOING FINE

## 2011-11-29 NOTE — Progress Notes (Signed)
Radiation Oncology         [phone] ________________________________  Name: Brittany Porter MRN: [id number]  Date: 11/29/2011  DOB: 1945-07-28  Follow-Up Visit Note  CC: Allean Found, MD  [name] Schimke, MD PHD  Diagnosis:   Stage II endometrial cancer  Interval Since Last Radiation:  30 months  Narrative:  The patient returns today for routine follow-up.  She seems to be doing recently well at this point. The patient does have a  history of ovarian cancer as well as metastatic breast cancer. Patient is on  letrozole for management of this issue.  She denies any pelvic pain,  low back pain, vaginal bleeding,  urination or bowel complaints.  She uses her vaginal dilator intermittently. She denies any bleeding with her dilator.                       ALLERGIES:  is allergic to sulfa antibiotics.  Meds: Current Outpatient Prescriptions  Medication Sig Dispense Refill  . Calcium Carbonate-Vitamin D (CALCIUM 600+D) 600-400 MG-UNIT per tablet Take 2 tablets by mouth daily.      . Cholecalciferol (VITAMIN D3) 2000 UNITS TABS Take 2,000 mg by mouth daily.      . ferrous sulfate 325 (65 FE) MG tablet Take 325 mg by mouth daily with breakfast.        . fish oil-omega-3 fatty acids 1000 MG capsule Take 2 g by mouth daily.        . furosemide (LASIX) 20 MG tablet Take 20 mg by mouth daily.       Marland Kitchen HYDROcodone-acetaminophen (NORCO) 5-325 MG per tablet Take 1 tablet by mouth every 6 (six) hours as needed.        Marland Kitchen letrozole (FEMARA) 2.5 MG tablet Take 2.5 mg by mouth daily.        . meloxicam (MOBIC) 7.5 MG tablet Take 15 mg by mouth daily.       . metFORMIN (GLUCOPHAGE) 500 MG tablet Take 500 mg by mouth 2 (two) times daily with a meal.        . prochlorperazine (COMPAZINE) 10 MG tablet Take 10 mg by mouth as needed.      . simvastatin (ZOCOR) 40 MG tablet Take 40 mg by mouth at bedtime.          Physical Findings: The patient is in no acute distress. Patient is alert and oriented.  weight is 231 lb 9.6 oz (105.053 kg). Her oral temperature is 98.6 F (37 C). Her blood pressure is 141/83 and her pulse is 73. Her respiration is 18. Marland Kitchen  No palpable cervical supraclavicular or axillary adenopathy. The lungs are clear to auscultation. The heart has regular rhythm and rate. The abdomen is soft and nontender with normal bowel sounds.  The inguinal areas are free of adenopathy. On pelvic examination the external genitalia are unremarkable. A speculum exam is performed. There are mild radiation changes noted in the proximal vagina. A Pap smear was obtained of the proximal vagina. There are no mucosal lesions noted. On bimanual and rectovaginal examination there note pelvic masses appreciated.  Lab Findings: Lab Results  Component Value Date   WBC 5.7 05/18/2011   HGB 13.3 05/18/2011   HCT 39.1 05/18/2011   MCV 88.7 05/18/2011   PLT 155 05/18/2011    @LASTCHEM @  Radiographic Findings: No results found.  Impression:  Stage II endometrioid adenocarcinoma, status post surgery and intracavitary brachytherapy treatments. There no signs of recurrence on exam  today,  Pap smear however is pending.  Plan:  Followup in one year The patient will see Dr. Nelly Rout in approximately 6 months. She will continue followup of her breast cancer with Dr. Welton Flakes as well as Dr. Derrell Lolling.  _____________________________________   Billie Lade, PhD, MD

## 2011-11-30 ENCOUNTER — Ambulatory Visit
Admission: RE | Admit: 2011-11-30 | Discharge: 2011-11-30 | Payer: Medicare Other | Source: Ambulatory Visit | Attending: Radiation Oncology | Admitting: Radiation Oncology

## 2011-12-08 NOTE — Progress Notes (Signed)
Left voice mail for pt to let her know he PAP was normal

## 2011-12-09 ENCOUNTER — Telehealth: Payer: Self-pay | Admitting: Oncology

## 2011-12-09 ENCOUNTER — Encounter: Payer: Self-pay | Admitting: Oncology

## 2011-12-09 ENCOUNTER — Ambulatory Visit (HOSPITAL_COMMUNITY)
Admission: RE | Admit: 2011-12-09 | Discharge: 2011-12-09 | Disposition: A | Discharge status: Home / Self Care | Payer: Medicare Other | Source: Ambulatory Visit | Attending: Oncology | Admitting: Oncology

## 2011-12-09 ENCOUNTER — Telehealth: Payer: Self-pay | Admitting: Medical Oncology

## 2011-12-09 ENCOUNTER — Ambulatory Visit (HOSPITAL_BASED_OUTPATIENT_CLINIC_OR_DEPARTMENT_OTHER): Payer: Medicare Other | Admitting: Oncology

## 2011-12-09 ENCOUNTER — Other Ambulatory Visit (HOSPITAL_BASED_OUTPATIENT_CLINIC_OR_DEPARTMENT_OTHER): Payer: Medicare Other | Admitting: Lab

## 2011-12-09 VITALS — BP 169/89 | HR 79 | Temp 99.1°F | Ht 64.0 in | Wt 233.5 lb

## 2011-12-09 DIAGNOSIS — R0602 Shortness of breath: Secondary | ICD-10-CM | POA: Insufficient documentation

## 2011-12-09 DIAGNOSIS — C549 Malignant neoplasm of corpus uteri, unspecified: Secondary | ICD-10-CM | POA: Diagnosis not present

## 2011-12-09 DIAGNOSIS — C50919 Malignant neoplasm of unspecified site of unspecified female breast: Secondary | ICD-10-CM | POA: Diagnosis not present

## 2011-12-09 DIAGNOSIS — C4359 Malignant melanoma of other part of trunk: Secondary | ICD-10-CM

## 2011-12-09 DIAGNOSIS — C569 Malignant neoplasm of unspecified ovary: Secondary | ICD-10-CM | POA: Diagnosis not present

## 2011-12-09 DIAGNOSIS — J90 Pleural effusion, not elsewhere classified: Secondary | ICD-10-CM | POA: Insufficient documentation

## 2011-12-09 DIAGNOSIS — C55 Malignant neoplasm of uterus, part unspecified: Secondary | ICD-10-CM

## 2011-12-09 LAB — CBC WITH DIFFERENTIAL/PLATELET
BASO%: 0.7 % (ref 0.0–2.0)
Basophils Absolute: 0 10*3/uL (ref 0.0–0.1)
EOS%: 2.8 % (ref 0.0–7.0)
Eosinophils Absolute: 0.1 10*3/uL (ref 0.0–0.5)
HCT: 38.8 % (ref 34.8–46.6)
HGB: 13 g/dL (ref 11.6–15.9)
LYMPH%: 28 % (ref 14.0–49.7)
MCH: 29.8 pg (ref 25.1–34.0)
MCHC: 33.5 g/dL (ref 31.5–36.0)
MCV: 88.9 fL (ref 79.5–101.0)
MONO#: 0.5 10*3/uL (ref 0.1–0.9)
MONO%: 9.3 % (ref 0.0–14.0)
NEUT#: 3 10*3/uL (ref 1.5–6.5)
NEUT%: 59.2 % (ref 38.4–76.8)
Platelets: 154 10*3/uL (ref 145–400)
RBC: 4.36 10*6/uL (ref 3.70–5.45)
RDW: 14.1 % (ref 11.2–14.5)
WBC: 5.1 10*3/uL (ref 3.9–10.3)
lymph#: 1.4 10*3/uL (ref 0.9–3.3)

## 2011-12-09 LAB — COMPREHENSIVE METABOLIC PANEL
ALT: 23 U/L (ref 0–35)
AST: 23 U/L (ref 0–37)
Albumin: 4.1 g/dL (ref 3.5–5.2)
Alkaline Phosphatase: 50 U/L (ref 39–117)
BUN: 14 mg/dL (ref 6–23)
CO2: 29 mEq/L (ref 19–32)
Calcium: 9.6 mg/dL (ref 8.4–10.5)
Chloride: 104 mEq/L (ref 96–112)
Creatinine, Ser: 0.57 mg/dL (ref 0.50–1.10)
Glucose, Bld: 159 mg/dL — ABNORMAL HIGH (ref 70–99)
Potassium: 4.9 mEq/L (ref 3.5–5.3)
Sodium: 143 mEq/L (ref 135–145)
Total Bilirubin: 0.6 mg/dL (ref 0.3–1.2)
Total Protein: 6.2 g/dL (ref 6.0–8.3)

## 2011-12-09 NOTE — Progress Notes (Signed)
OFFICE PROGRESS NOTE  CC  Dr. Claud Kelp Dr. Antony Blackbird, Dr. Merri Brunette  DIAGNOSIS: 66 year old female with  #1 stage II endometrial carcinoma and stage I ovarian carcinoma diagnosed in June 2010.  #2 malignant melanoma diagnosed August 2010 status post wide local excision. 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  CURRENT THERAPY: letrozole 2.5 mg daily since January 2012  INTERVAL HISTORY: Brittany Porter 66 y.o. female returns for followup visit today. Overall she is doing well. Her main complaint is hot flashes and some joint achiness. She otherwise denies any fevers chills night sweats headaches shortness of breath chest pains palpitations. Patient does tell me recently she had arthroscopic surgery performed  of her right knee and she is healing from this slowly. She has not noticed any hematuria vaginal discharge or bleeding.remainder of the 10 point review of systems is negative   MEDICAL HISTORY:iron deficiency hyperlipidemia diabetesfluid overall  ALLERGIES:  is allergic to sulfa antibiotics.  MEDICATIONS:  Current Outpatient Prescriptions  Medication Sig Dispense Refill  . Calcium Carbonate-Vitamin D (CALCIUM 600+D) 600-400 MG-UNIT per tablet Take 2 tablets by mouth daily.      . Cholecalciferol (VITAMIN D3) 2000 UNITS TABS Take 2,000 mg by mouth daily.      . ferrous sulfate 325 (65 FE) MG tablet Take 325 mg by mouth daily with breakfast.        . fish oil-omega-3 fatty acids 1000 MG capsule Take 2 g by mouth daily.        . furosemide (LASIX) 20 MG tablet Take 20 mg by mouth daily.       Marland Kitchen HYDROcodone-acetaminophen (NORCO) 5-325 MG per tablet Take 1 tablet by mouth every 6 (six) hours as needed.        Marland Kitchen letrozole (FEMARA) 2.5 MG tablet Take 2.5 mg by mouth daily.        . meloxicam (MOBIC) 7.5 MG tablet Take 15 mg by mouth daily.       . metFORMIN (GLUCOPHAGE) 500  MG tablet Take 500 mg by mouth 2 (two) times daily with a meal.        . prochlorperazine (COMPAZINE) 10 MG tablet Take 10 mg by mouth as needed.      . simvastatin (ZOCOR) 40 MG tablet Take 40 mg by mouth at bedtime.          SURGICAL HISTORY:   #1 status post bilateral mastectomies  #2 status post bilateral oophorectomies and hysterectomy.  #3 status post wide local excision of malignant melanoma of the back.  REVIEW OF SYSTEMS:  Pertinent items are noted in HPI.   PHYSICAL EXAMINATION:  Gen.: Awake alert in no acute distress well appearing female HEENT: EOMI PERRLA sclerae anicteric no conjunctival pallor oral mucosa is moist neck is supple there is no palpable cervical supraclavicular or axillary adenopathy  Lungs: Clear bilaterally Cardiovascular: Regular rate rhythm Abdomen: Soft nontender nondistended bowel sounds are present healed surgical scars no hepatosplenomegaly Extremities: +1 edema Neuro: Alert oriented otherwise nonfocal Skin: Back upper back reveals well-healed surgical scar from quite local excision of melanoma. No CVA or spinal tenderness Bilateral mastectomy scars looks well healed there is no nodularity  or masses ECOG PERFORMANCE STATUS: 1 - Symptomatic but completely ambulatory  Blood pressure 169/89, pulse 79, temperature 99.1 F (37.3 C), temperature source Oral, height 5\' 4"  (1.626 m), weight 233 lb 8 oz (105.915 kg).  LABORATORY DATA: Lab Results  Component Value Date   WBC 5.1 12/09/2011   HGB 13.0 12/09/2011   HCT 38.8 12/09/2011   MCV 88.9 12/09/2011   PLT 154 12/09/2011      Chemistry      Component Value Date/Time   NA 142 05/18/2011 1133   NA 140 11/17/2009 1136   K 4.7 05/18/2011 1133   K 4.2 11/17/2009 1136   CL 102 05/18/2011 1133   CL 103 11/17/2009 1136   CO2 29 05/18/2011 1133   CO2 28 11/17/2009 1136   BUN 19 05/18/2011 1133   BUN 13 11/17/2009 1136   CREATININE 0.57 05/18/2011 1133   CREATININE 0.6 11/17/2009 1136      Component Value Date/Time    CALCIUM 9.3 05/18/2011 1133   CALCIUM 9.7 11/17/2009 1136   ALKPHOS 41 05/18/2011 1133   ALKPHOS 60 11/17/2009 1136   AST 20 05/18/2011 1133   AST 24 11/17/2009 1136   ALT 18 05/18/2011 1133   BILITOT 0.7 05/18/2011 1133   BILITOT 1.00 11/17/2009 1136       RADIOGRAPHIC STUDIES:  No results found.  ASSESSMENT: 66 year old female with 3 primary malignancies including stage II endometrial carcinoma and stage I ovarian carcinoma diagnosed in June 2010. She was treated aggressively with chemotherapy and radiation. Patient also had a malignant melanoma and she underwent a wide local excision in August 2010. In December 2011 patient developed recurrence of her breast cancer and she was subsequently started on letrozole 2.5 mg daily starting in January 2012. Overall patient is doing remarkably well and we will continue her present treatment.   PLAN: continue letrozole 2.5 mg daily. She will continue to followup with Dr. [name] Schimke as well as Dr. Derrell Lolling and Dr. Antony Blackbird.   All questions were answered. The patient knows to call the clinic with any problems, questions or concerns. We can certainly see the patient much sooner if necessary.  I spent 20 minutes counseling the patient face to face. The total time spent in the appointment was 30 minutes.    Drue Second, MD Medical/Oncology [facility] [phone] (beeper) [phone] (Office)  12/09/2011, 11:30 AM

## 2011-12-09 NOTE — Telephone Encounter (Signed)
Patient returned call, per MD, patient with no acute process, and pleural effusions are better.  Patient expressed understanding and has no questions at this time.  Instructed patient to call with any questions or concerns.

## 2011-12-09 NOTE — Telephone Encounter (Signed)
Patient's home number no longer in service.  LMOVM of cell number for a return call back to clinic.

## 2011-12-09 NOTE — Patient Instructions (Addendum)
Doing well, continue letrozole.  Chest X-ray today  See you back in 6 months

## 2011-12-09 NOTE — Telephone Encounter (Signed)
Message copied by Tylene Fantasia on Fri Dec 09, 2011  4:11 PM ------      Message from: Victorino December      Created: Fri Dec 09, 2011  3:07 PM       Please call patient: no acute process, pleural effusion better

## 2011-12-09 NOTE — Telephone Encounter (Signed)
gve the pt her feb 2014 appt valendar along with the cxr referral for today at [facility]

## 2011-12-14 DIAGNOSIS — M25579 Pain in unspecified ankle and joints of unspecified foot: Secondary | ICD-10-CM | POA: Diagnosis not present

## 2011-12-14 IMAGING — CR DG CHEST 1V
1 series · 1 of 1 positions shown · non-contrast
Comparison: 03/04/2010

CLINICAL DATA: Left thoracentesis.  Left pleural effusion.  Cough.

CHEST - 1 VIEW

[w chest pa]
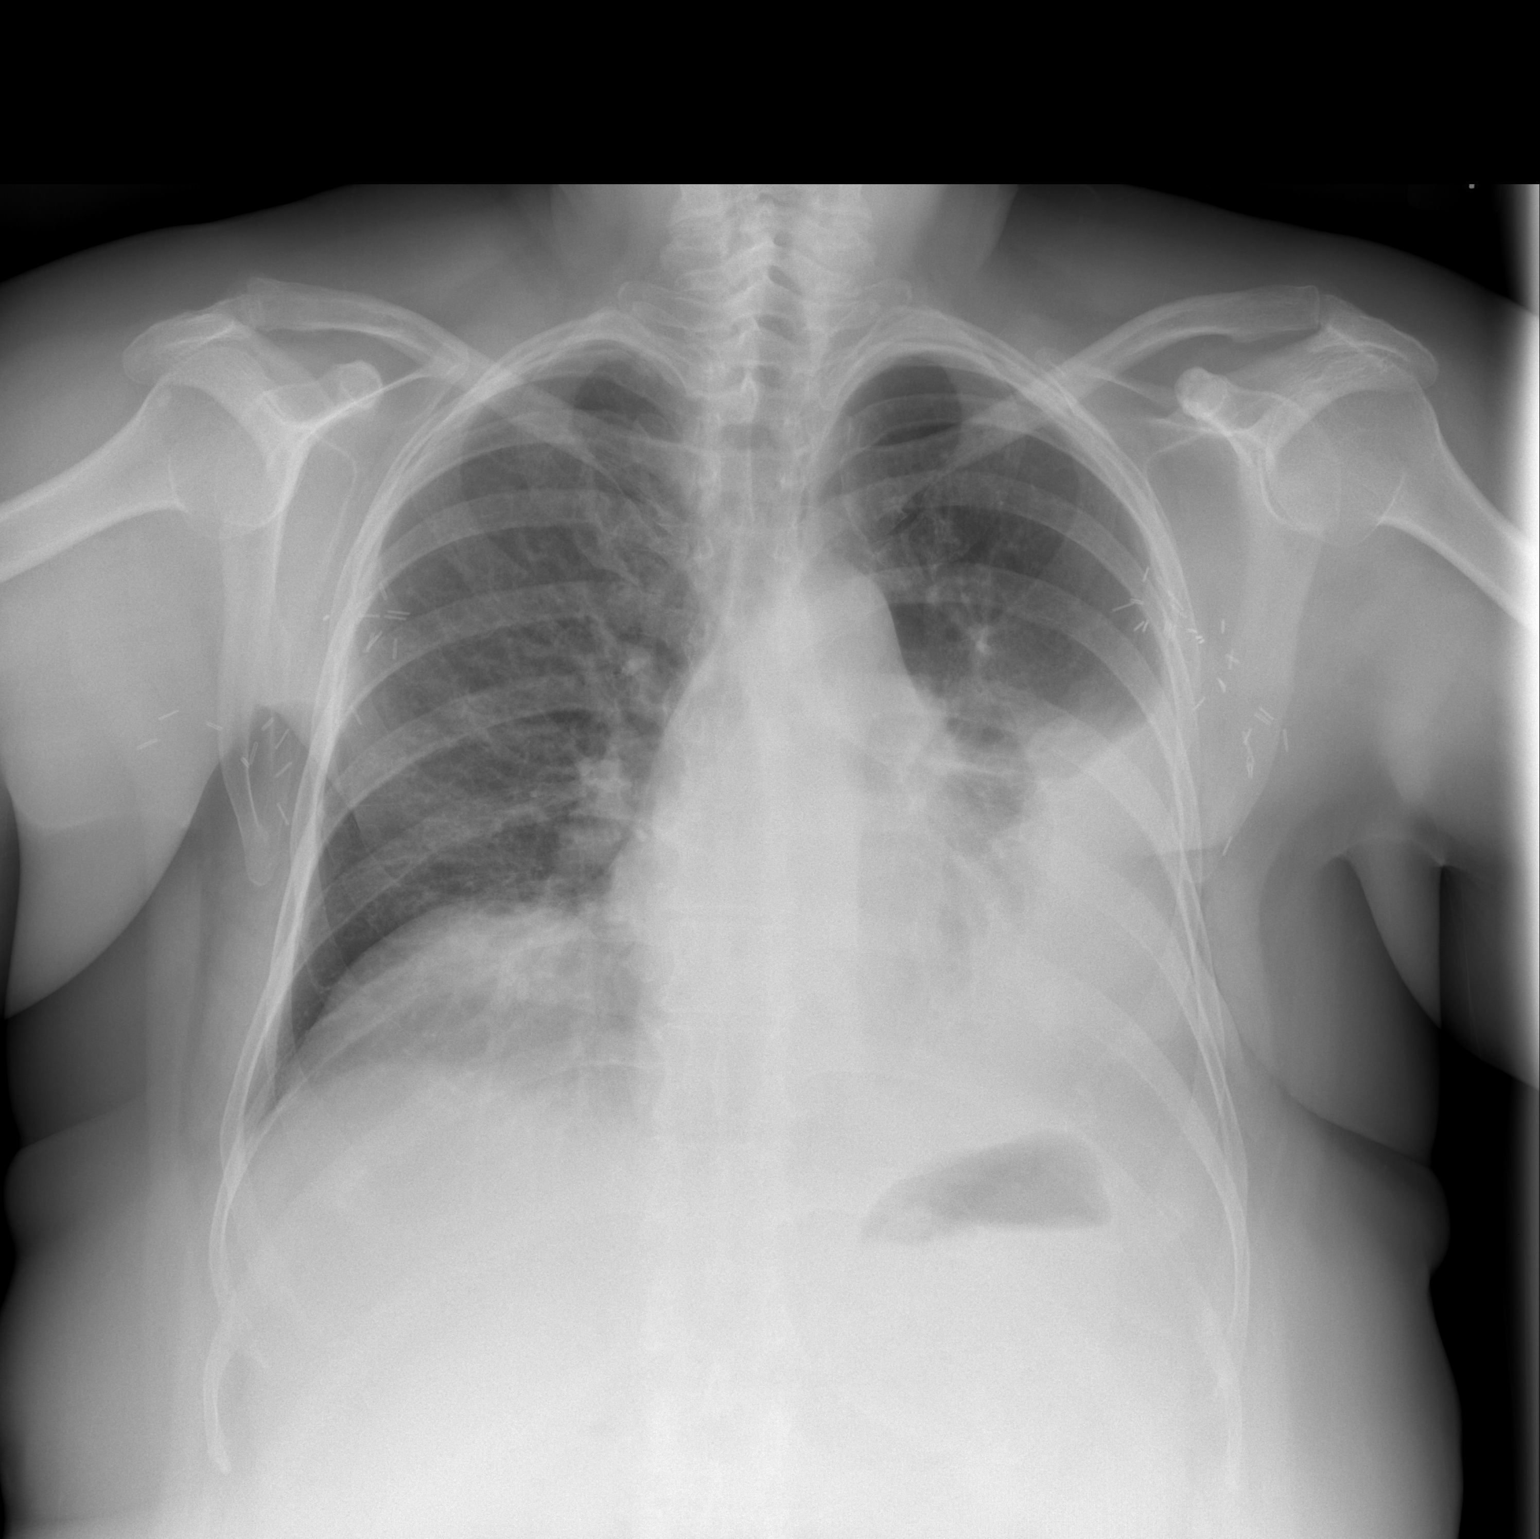

[1 of 1 positions shown; findings below may reference images not displayed]

FINDINGS: The left pleural effusion has decreased since the prior
study, now moderate.
There is no evidence of pneumothorax.
Left lower lung atelectasis/consolidation again noted.
The cardiomediastinal silhouette is stable.
Surgical clips in both axillary regions are again noted.
IMPRESSION: Decreased left pleural effusion following left thoracentesis.  No
evidence of pneumothorax.

## 2011-12-28 DIAGNOSIS — E119 Type 2 diabetes mellitus without complications: Secondary | ICD-10-CM | POA: Diagnosis not present

## 2011-12-28 DIAGNOSIS — E7800 Pure hypercholesterolemia, unspecified: Secondary | ICD-10-CM | POA: Diagnosis not present

## 2012-01-10 DIAGNOSIS — H2510 Age-related nuclear cataract, unspecified eye: Secondary | ICD-10-CM | POA: Diagnosis not present

## 2012-01-10 DIAGNOSIS — E11319 Type 2 diabetes mellitus with unspecified diabetic retinopathy without macular edema: Secondary | ICD-10-CM | POA: Diagnosis not present

## 2012-01-10 DIAGNOSIS — H35369 Drusen (degenerative) of macula, unspecified eye: Secondary | ICD-10-CM | POA: Diagnosis not present

## 2012-01-21 ENCOUNTER — Emergency Department (HOSPITAL_COMMUNITY): Payer: Medicare Other

## 2012-01-21 ENCOUNTER — Encounter (HOSPITAL_COMMUNITY): Payer: Self-pay | Admitting: Emergency Medicine

## 2012-01-21 ENCOUNTER — Inpatient Hospital Stay (HOSPITAL_COMMUNITY)
Admission: EM | Admit: 2012-01-21 | Discharge: 2012-01-25 | DRG: 390 | Disposition: A | Discharge status: Home / Self Care | Payer: Medicare Other | Attending: Internal Medicine | Admitting: Internal Medicine

## 2012-01-21 ENCOUNTER — Emergency Department (INDEPENDENT_AMBULATORY_CARE_PROVIDER_SITE_OTHER)
Admission: EM | Admit: 2012-01-21 | Discharge: 2012-01-21 | Disposition: A | Discharge status: Nursing Home (Includes ICF) | Payer: Medicare Other | Source: Home / Self Care | Attending: Emergency Medicine | Admitting: Emergency Medicine

## 2012-01-21 DIAGNOSIS — C569 Malignant neoplasm of unspecified ovary: Secondary | ICD-10-CM

## 2012-01-21 DIAGNOSIS — K56609 Unspecified intestinal obstruction, unspecified as to partial versus complete obstruction: Principal | ICD-10-CM | POA: Diagnosis present

## 2012-01-21 DIAGNOSIS — Z79899 Other long term (current) drug therapy: Secondary | ICD-10-CM | POA: Diagnosis not present

## 2012-01-21 DIAGNOSIS — R109 Unspecified abdominal pain: Secondary | ICD-10-CM | POA: Diagnosis not present

## 2012-01-21 DIAGNOSIS — E876 Hypokalemia: Secondary | ICD-10-CM | POA: Diagnosis present

## 2012-01-21 DIAGNOSIS — C4359 Malignant melanoma of other part of trunk: Secondary | ICD-10-CM

## 2012-01-21 DIAGNOSIS — J029 Acute pharyngitis, unspecified: Secondary | ICD-10-CM | POA: Diagnosis not present

## 2012-01-21 DIAGNOSIS — Z9221 Personal history of antineoplastic chemotherapy: Secondary | ICD-10-CM | POA: Diagnosis not present

## 2012-01-21 DIAGNOSIS — K439 Ventral hernia without obstruction or gangrene: Secondary | ICD-10-CM | POA: Diagnosis not present

## 2012-01-21 DIAGNOSIS — Z8543 Personal history of malignant neoplasm of ovary: Secondary | ICD-10-CM | POA: Diagnosis not present

## 2012-01-21 DIAGNOSIS — Z9010 Acquired absence of unspecified breast and nipple: Secondary | ICD-10-CM | POA: Diagnosis not present

## 2012-01-21 DIAGNOSIS — E119 Type 2 diabetes mellitus without complications: Secondary | ICD-10-CM | POA: Diagnosis present

## 2012-01-21 DIAGNOSIS — Z90710 Acquired absence of both cervix and uterus: Secondary | ICD-10-CM | POA: Diagnosis not present

## 2012-01-21 DIAGNOSIS — C55 Malignant neoplasm of uterus, part unspecified: Secondary | ICD-10-CM

## 2012-01-21 DIAGNOSIS — Z923 Personal history of irradiation: Secondary | ICD-10-CM | POA: Diagnosis not present

## 2012-01-21 DIAGNOSIS — K56600 Partial intestinal obstruction, unspecified as to cause: Secondary | ICD-10-CM | POA: Diagnosis present

## 2012-01-21 DIAGNOSIS — Z8542 Personal history of malignant neoplasm of other parts of uterus: Secondary | ICD-10-CM

## 2012-01-21 DIAGNOSIS — E785 Hyperlipidemia, unspecified: Secondary | ICD-10-CM | POA: Diagnosis present

## 2012-01-21 DIAGNOSIS — J90 Pleural effusion, not elsewhere classified: Secondary | ICD-10-CM | POA: Diagnosis not present

## 2012-01-21 DIAGNOSIS — K5730 Diverticulosis of large intestine without perforation or abscess without bleeding: Secondary | ICD-10-CM | POA: Diagnosis not present

## 2012-01-21 DIAGNOSIS — Z85820 Personal history of malignant melanoma of skin: Secondary | ICD-10-CM

## 2012-01-21 DIAGNOSIS — Z853 Personal history of malignant neoplasm of breast: Secondary | ICD-10-CM

## 2012-01-21 DIAGNOSIS — Z09 Encounter for follow-up examination after completed treatment for conditions other than malignant neoplasm: Secondary | ICD-10-CM | POA: Diagnosis not present

## 2012-01-21 DIAGNOSIS — K6389 Other specified diseases of intestine: Secondary | ICD-10-CM | POA: Diagnosis not present

## 2012-01-21 DIAGNOSIS — R112 Nausea with vomiting, unspecified: Secondary | ICD-10-CM | POA: Diagnosis not present

## 2012-01-21 LAB — POCT URINALYSIS DIP (DEVICE)
Glucose, UA: NEGATIVE mg/dL
Ketones, ur: 40 mg/dL — AB
Nitrite: NEGATIVE
Protein, ur: NEGATIVE mg/dL
Specific Gravity, Urine: 1.015 (ref 1.005–1.030)
Urobilinogen, UA: 0.2 mg/dL (ref 0.0–1.0)
pH: 7 (ref 5.0–8.0)

## 2012-01-21 LAB — URINALYSIS, ROUTINE W REFLEX MICROSCOPIC
Glucose, UA: NEGATIVE mg/dL
Hgb urine dipstick: NEGATIVE
Ketones, ur: 15 mg/dL — AB
Nitrite: NEGATIVE
Protein, ur: NEGATIVE mg/dL
Specific Gravity, Urine: 1.022 (ref 1.005–1.030)
Urobilinogen, UA: 1 mg/dL (ref 0.0–1.0)
pH: 7.5 (ref 5.0–8.0)

## 2012-01-21 LAB — BASIC METABOLIC PANEL
BUN: 12 mg/dL (ref 6–23)
CO2: 29 mEq/L (ref 19–32)
Calcium: 10 mg/dL (ref 8.4–10.5)
Chloride: 101 mEq/L (ref 96–112)
Creatinine, Ser: 0.47 mg/dL — ABNORMAL LOW (ref 0.50–1.10)
GFR calc Af Amer: >90 mL/min (ref >90)
GFR calc non Af Amer: >90 mL/min (ref >90)
Glucose, Bld: 217 mg/dL — ABNORMAL HIGH (ref 70–99)
Potassium: 4.1 mEq/L (ref 3.5–5.1)
Sodium: 141 mEq/L (ref 135–145)

## 2012-01-21 LAB — CBC WITH DIFFERENTIAL/PLATELET
Basophils Absolute: 0 K/uL (ref 0.0–0.1)
Basophils Relative: 0 % (ref 0–1)
Eosinophils Absolute: 0 10*3/uL (ref 0.0–0.7)
Eosinophils Relative: 0 % (ref 0–5)
HCT: 43.3 % (ref 36.0–46.0)
Hemoglobin: 14.7 g/dL (ref 12.0–15.0)
Lymphocytes Relative: 16 % (ref 12–46)
Lymphs Abs: 1.6 10*3/uL (ref 0.7–4.0)
MCH: 29.8 pg (ref 26.0–34.0)
MCHC: 33.9 g/dL (ref 30.0–36.0)
MCV: 87.7 fL (ref 78.0–100.0)
Monocytes Absolute: 0.5 10*3/uL (ref 0.1–1.0)
Monocytes Relative: 5 % (ref 3–12)
Neutro Abs: 7.5 10*3/uL (ref 1.7–7.7)
Neutrophils Relative %: 78 % — ABNORMAL HIGH (ref 43–77)
Platelets: 184 10*3/uL (ref 150–400)
RBC: 4.94 MIL/uL (ref 3.87–5.11)
RDW: 13.4 % (ref 11.5–15.5)
WBC: 9.6 10*3/uL (ref 4.0–10.5)

## 2012-01-21 LAB — URINE MICROSCOPIC-ADD ON

## 2012-01-21 LAB — HEPATIC FUNCTION PANEL
ALT: 25 U/L (ref 0–35)
AST: 30 U/L (ref 0–37)
Albumin: 3.8 g/dL (ref 3.5–5.2)
Alkaline Phosphatase: 49 U/L (ref 39–117)
Bilirubin, Direct: 0.2 mg/dL (ref 0.0–0.3)
Indirect Bilirubin: 0.8 mg/dL (ref 0.3–0.9)
Total Bilirubin: 1 mg/dL (ref 0.3–1.2)
Total Protein: 7 g/dL (ref 6.0–8.3)

## 2012-01-21 LAB — LACTIC ACID, PLASMA: Lactic Acid, Venous: 2.1 mmol/L (ref 0.5–2.2)

## 2012-01-21 LAB — LIPASE, BLOOD: Lipase: 34 U/L (ref 11–59)

## 2012-01-21 LAB — GLUCOSE, CAPILLARY: Glucose-Capillary: 179 mg/dL — ABNORMAL HIGH (ref 70–99)

## 2012-01-21 MED ORDER — SODIUM CHLORIDE 0.9 % IV SOLN
Freq: Once | INTRAVENOUS | Status: AC
  Administered 2012-01-21: 18:00:00 via INTRAVENOUS

## 2012-01-21 MED ORDER — IOHEXOL 300 MG/ML  SOLN
100.0000 mL | Freq: Once | INTRAMUSCULAR | Status: AC | PRN
  Administered 2012-01-21: 100 mL via INTRAVENOUS

## 2012-01-21 MED ORDER — SODIUM CHLORIDE 0.9 % IV BOLUS (SEPSIS)
500.0000 mL | Freq: Once | INTRAVENOUS | Status: AC
  Administered 2012-01-21: 500 mL via INTRAVENOUS

## 2012-01-21 MED ORDER — ONDANSETRON HCL 4 MG/2ML IJ SOLN
4.0000 mg | Freq: Once | INTRAMUSCULAR | Status: AC
  Administered 2012-01-21: 4 mg via INTRAVENOUS
  Filled 2012-01-21: qty 2

## 2012-01-21 MED ORDER — IOHEXOL 300 MG/ML  SOLN
20.0000 mL | INTRAMUSCULAR | Status: AC
  Administered 2012-01-21: 20 mL via ORAL

## 2012-01-21 MED ORDER — HYDROMORPHONE HCL PF 1 MG/ML IJ SOLN
1.0000 mg | Freq: Once | INTRAMUSCULAR | Status: AC
  Administered 2012-01-21: 1 mg via INTRAVENOUS
  Filled 2012-01-21: qty 1

## 2012-01-21 MED ORDER — MORPHINE SULFATE 4 MG/ML IJ SOLN
4.0000 mg | Freq: Once | INTRAMUSCULAR | Status: AC
  Administered 2012-01-21: 4 mg via INTRAVENOUS
  Filled 2012-01-21: qty 1

## 2012-01-21 NOTE — ED Notes (Signed)
CT notified pt finished drinking contrast at 1800

## 2012-01-21 NOTE — Consult Note (Signed)
Reason for Consult:abdominal pain, possible sbo Referring Physician: Dr Lenny Pastel  Brittany Porter is an 66 y.o. female.  HPI: 16 yof with stage IV breast cancer, history of endometrial cancer treated with tah/bso/chemo/xrt/omentectomy/appy who presents with about a 24 hour history of abdominal pain.  This is associated with nausea and some emesis today.  No fever.  She has mid abdominal pain that came in waves but is now better but she has received some pain medication also.  She is passing a small amount of flatus and last had a bm yesterday. She came in due to not tolerating po, abdominal pain.   She is doing well from endometrial cancer.  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  She is being managed by Dr. Welton Flakes with letrozole now.   Past Medical History  Diagnosis Date  . Pleural effusion 04/2010  . Breast cancer   . Ovarian cancer   . Endometrial carcinoma 10/2008  . Iron deficiency   . Diabetes mellitus   . Hyperlipidemia   . Fluid overload   . Arthritis     Past Surgical History  Procedure Date  . Mastectomy 1997    Bilateral  . Abdominal hysterectomy 10/2008  . Melanoma removal 02/2009, 07/2009  . Knee arthroscopy 04/2010    Right knee  . Breast surgery 04/04/96    bil masty    Family History  Problem Relation Age of Onset  . Stroke Mother   . Cancer Father   . Colon cancer Sister   . Stroke Sister   . Cancer Sister     breast  . Heart attack Brother   . Cancer Brother     skin  . Skin cancer Brother   . Cancer Sister     colon    Social History:  reports that she has never smoked. She does not have any smokeless tobacco history on file. She reports that she does not drink alcohol or use illicit drugs.  Allergies:  Allergies  Allergen Reactions  . Sulfa Antibiotics     "Eyes swelled shut"    Medication List  As of 01/21/2012 10:14 PM     ASK your doctor about these medications         Calcium 600+D 600-400 MG-UNIT per tablet   Generic drug: Calcium Carbonate-Vitamin D   Take 2 tablets by mouth daily.      ferrous sulfate 325 (65 FE) MG tablet   Take 325 mg by mouth daily with breakfast.      fish oil-omega-3 fatty acids 1000 MG capsule   Take 2 g by mouth daily.      furosemide 20 MG tablet   Commonly known as: LASIX   Take 20 mg by mouth daily.      letrozole 2.5 MG tablet   Commonly known as: FEMARA   Take 2.5 mg by mouth daily.      meloxicam 7.5 MG tablet   Commonly known as: MOBIC   Take 7.5 mg by mouth 2 (two) times daily.      metFORMIN 500 MG tablet   Commonly known as: GLUCOPHAGE   Take 500 mg by mouth 2 (two) times daily with a meal.      prochlorperazine 10 MG tablet   Commonly known as: COMPAZINE   Take 10 mg by mouth as needed. For nausea      simvastatin 40  MG tablet   Commonly known as: ZOCOR   Take 40 mg by mouth at bedtime.      Vitamin D3 2000 UNITS Tabs   Take 2,000 mg by mouth daily.             Results for orders placed during the hospital encounter of 01/21/12 (from the past 48 hour(s))  CBC WITH DIFFERENTIAL     Status: Abnormal   Collection Time   01/21/12  2:25 PM      Component Value Range Comment   WBC 9.6  4.0 - 10.5 K/uL    RBC 4.94  3.87 - 5.11 MIL/uL    Hemoglobin 14.7  12.0 - 15.0 g/dL    HCT 16.1  09.6 - 04.5 %    MCV 87.7  78.0 - 100.0 fL    MCH 29.8  26.0 - 34.0 pg    MCHC 33.9  30.0 - 36.0 g/dL    RDW 40.9  81.1 - 91.4 %    Platelets 184  150 - 400 K/uL    Neutrophils Relative 78 (*) 43 - 77 %    Neutro Abs 7.5  1.7 - 7.7 K/uL    Lymphocytes Relative 16  12 - 46 %    Lymphs Abs 1.6  0.7 - 4.0 K/uL    Monocytes Relative 5  3 - 12 %    Monocytes Absolute 0.5  0.1 - 1.0 K/uL    Eosinophils Relative 0  0 - 5 %    Eosinophils Absolute 0.0  0.0 - 0.7 K/uL    Basophils Relative 0  0 - 1 %    Basophils Absolute 0.0  0.0 - 0.1 K/uL   BASIC METABOLIC  PANEL     Status: Abnormal   Collection Time   01/21/12  2:25 PM      Component Value Range Comment   Sodium 141  135 - 145 mEq/L    Potassium 4.1  3.5 - 5.1 mEq/L    Chloride 101  96 - 112 mEq/L    CO2 29  19 - 32 mEq/L    Glucose, Bld 217 (*) 70 - 99 mg/dL    BUN 12  6 - 23 mg/dL    Creatinine, Ser 7.82 (*) 0.50 - 1.10 mg/dL    Calcium 95.6  8.4 - 10.5 mg/dL    GFR calc non Af Amer >90  >90 mL/min    GFR calc Af Amer >90  >90 mL/min   LACTIC ACID, PLASMA     Status: Normal   Collection Time   01/21/12  4:17 PM      Component Value Range Comment   Lactic Acid, Venous 2.1  0.5 - 2.2 mmol/L   HEPATIC FUNCTION PANEL     Status: Normal   Collection Time   01/21/12  4:20 PM      Component Value Range Comment   Total Protein 7.0  6.0 - 8.3 g/dL    Albumin 3.8  3.5 - 5.2 g/dL    AST 30  0 - 37 U/L    ALT 25  0 - 35 U/L    Alkaline Phosphatase 49  39 - 117 U/L    Total Bilirubin 1.0  0.3 - 1.2 mg/dL    Bilirubin, Direct 0.2  0.0 - 0.3 mg/dL    Indirect Bilirubin 0.8  0.3 - 0.9 mg/dL   LIPASE, BLOOD     Status: Normal   Collection Time   01/21/12  4:20 PM  Component Value Range Comment   Lipase 34  11 - 59 U/L   URINALYSIS, ROUTINE W REFLEX MICROSCOPIC     Status: Abnormal   Collection Time   01/21/12  8:45 PM      Component Value Range Comment   Color, Urine YELLOW  YELLOW    APPearance CLOUDY (*) CLEAR    Specific Gravity, Urine 1.022  1.005 - 1.030    pH 7.5  5.0 - 8.0    Glucose, UA NEGATIVE  NEGATIVE mg/dL    Hgb urine dipstick NEGATIVE  NEGATIVE    Bilirubin Urine SMALL (*) NEGATIVE    Ketones, ur 15 (*) NEGATIVE mg/dL    Protein, ur NEGATIVE  NEGATIVE mg/dL    Urobilinogen, UA 1.0  0.0 - 1.0 mg/dL    Nitrite NEGATIVE  NEGATIVE    Leukocytes, UA MODERATE (*) NEGATIVE   URINE MICROSCOPIC-ADD ON     Status: Abnormal   Collection Time   01/21/12  8:45 PM      Component Value Range Comment   Squamous Epithelial / LPF FEW (*) RARE    WBC, UA 21-50  <3 WBC/hpf    RBC / HPF  0-2  <3 RBC/hpf    Bacteria, UA RARE  RARE     Ct Abdomen Pelvis W Contrast  01/21/2012  *RADIOLOGY REPORT*  Clinical Data: Mid abdominal pain.  Nausea and vomiting.  History of endometrial cancer.  History of breast cancer.  CT ABDOMEN AND PELVIS WITH CONTRAST  Technique:  Multidetector CT imaging of the abdomen and pelvis was performed following the standard protocol during bolus administration of intravenous contrast.  Contrast: OMNIPAQUE IOHEXOL 300 MG/ML  SOLN  Comparison: 03/30/2010  Findings: The lung bases demonstrate pleural thickening and pleural calcification on the left with atelectasis and scarring in the left lung base.  Nodular scarring versus rounded atelectasis anteriorly on the left.  Pulmonary nodule is not excluded.  The pleural effusion has resolved since previous study.  Mild atelectasis in the right lung base.  Diffuse low attenuation change throughout the liver consistent with fatty infiltration.  Small esophageal hiatal hernia.  The gallbladder, spleen, pancreas, adrenal glands, kidneys, abdominal aorta, and retroperitoneal lymph nodes are unremarkable.  The stomach is not abnormally distended.  Stool filled colon without distension.  There is a midline anterior abdominal wall hernia, likely postoperative.  The hernia contains bowel loops.  There is mild dilatation of small bowel proximal and distal to the hernia. There is decompression of the distal ileum.  Changes suggest early or partial small bowel obstruction, likely due to adhesions. Contrast material does pass into the nondistended bowel.  No free air or free fluid in the abdomen.  Pelvis:  Uterus is surgically absent.  No abnormal adnexal masses. No significant pelvic lymphadenopathy.  No free or loculated pelvic fluid collections.  Diverticula in the sigmoid colon without diverticulitis.  The appendix is not identified.  Degenerative changes in the lumbar spine.  IMPRESSION: Ventral abdominal wall hernia, likely  incisional.  The hernia contains bowel.  There is mild distension of small bowel before and after the hernia compression of the terminal ileum.  Changes suggest partial obstruction, likely due to adhesions. Diffuse fatty infiltration of the liver. Pleural and parenchymal scarring in the left lung base with nodular scarring versus rounded atelectasis.   Original Report Authenticated By: Marlon Pel, M.D.    Dg Abd Acute W/chest  01/21/2012  *RADIOLOGY REPORT*  Clinical Data: Nausea and vomiting.  ACUTE  ABDOMEN SERIES (ABDOMEN 2 VIEW & CHEST 1 VIEW)  Comparison: CT scan 03/30/2010.  Findings: The upright chest x-ray demonstrates a surgical changes from bilateral mastectomies.  There is a left pleural effusion and left basilar scarring changes.  Two views of the abdomen demonstrate an unremarkable/nonspecific bowel gas pattern.  There is scattered air and stool in the colon and scattered small bowel loops with air but no distention.  The soft tissue shadows are maintained.  No free air is identified. 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  IMPRESSION:  1.  Left pleural effusion and left basilar scarring. 2.  No plain film findings for an acute abdominal process.   Original Report Authenticated By: P. Loralie Champagne, M.D.     Review of Systems  Constitutional: Negative for fever and weight loss.  Gastrointestinal: Positive for nausea, vomiting and abdominal pain. Negative for diarrhea and constipation.   Blood pressure 111/71, pulse 88, temperature 98.9 F (37.2 C), temperature source Oral, resp. rate 18, SpO2 96.00%. Physical Exam  Vitals reviewed. Constitutional: She appears well-developed and well-nourished. No distress.  Eyes: No scleral icterus.  Cardiovascular: Normal rate, regular rhythm and normal heart sounds.   Respiratory: Effort normal and breath sounds normal. She has no wheezes. She has no rales.  GI: Soft. Bowel sounds are decreased. There is no tenderness. No hernia  (I cannot identify a hernia likely due to her body habitus.  her whole incision feels soft and is nontender).    Skin: She is not diaphoretic.    Assessment/Plan: pSBO possibly related to incisional hernia  She does not need to go to or right now. This may very well be pain related to her incisional hernia seen on ct with a partial sbo.  I think conservative management for now with ng tube, repeat films, labs in am reasonable. If this resolves can discuss repair as outpatient.  She is high risk for recurrence given her habitus.  If it doesn't resolve or gets worse will need repair as inpatient.    Jari Carollo 01/21/2012, 10:09 PM

## 2012-01-21 NOTE — ED Notes (Signed)
Pt c/o stomach pain/lower back pain with vomiting that started 01/20/2012. Pt has tried advil and a heating pad with no relief.

## 2012-01-21 NOTE — ED Notes (Signed)
Pt presented with abdominal pain since last night.

## 2012-01-21 NOTE — ED Provider Notes (Signed)
Chief Complaint  Patient presents with  . Abdominal Pain    stomache pain    History of Present Illness:    Mrs. Brittany Porter is a 66 year old female with multiple medical problems including diabetes and hyperlipidemia. She also has a history of 3 separate cancers, one of the breasts, one of the ovary, and one of the endometrium. She had been doing well up until yesterday when she developed abdominal pain. This began in the lower abdomen and radiated up to the upper abdomen. It was located in the midline with radiation to the back. It felt like a sharp, stabbing pain rated 10 over 10 in intensity at the most, right now being 5-6/10. It doubled her over in pain. She had nausea and vomiting with it. She denies any hematemesis, coffee-ground emesis, or bilious emesis. She has been constipated since this began. She's felt feverish and chilled. She denies any hematuria or frequency. She's felt slight dysuria. She has had an appendectomy in the past.  Review of Systems:  Other than noted above, the patient denies any of the following symptoms: Constitutional:  No fever, chills, fatigue, weight loss or anorexia. Lungs:  No cough or shortness of breath. Heart:  No chest pain, palpitations, syncope or edema.  No cardiac history. Abdomen:  No nausea, vomiting, hematememesis, melena, diarrhea, or hematochezia. GU:  No dysuria, frequency, urgency, or hematuria. Gyn:  No vaginal discharge, itching, abnormal bleeding, dyspareunia, or pelvic pain.  PMFSH:  Past medical history, family history, social history, meds, and allergies were reviewed along with nurse's notes.  No prior abdominal surgeries, past history of GI problems, STDs or GYN problems.  No history of aspirin or NSAID use.  No excessive alcohol intake.  Physical Exam:   Vital signs:  BP 165/66  Pulse 97  Temp 99.5 F (37.5 C) (Oral)  Resp 20  SpO2 96% Gen:  Alert, oriented, in no distress. Lungs:  Breath sounds clear and equal bilaterally.  No  wheezes, rales or rhonchi. Heart:  Regular rhythm.  No gallops or murmers.   Abdomen:  Abdomen is soft, flat, nondistended. There is moderate pain to palpation just above the umbilicus but also with slight pain to palpation in the epigastrium and right and left upper quadrants. There is no guarding or rebound. No organomegaly or mass. Bowel sounds are normally active. Skin:  Clear, warm and dry.  No rash.  Labs:   Results for orders placed during the hospital encounter of 01/21/12  POCT URINALYSIS DIP (DEVICE)      Component Value Range   Glucose, UA NEGATIVE  NEGATIVE mg/dL   Bilirubin Urine SMALL (*) NEGATIVE   Ketones, ur 40 (*) NEGATIVE mg/dL   Specific Gravity, Urine 1.015  1.005 - 1.030   Hgb urine dipstick TRACE (*) NEGATIVE   pH 7.0  5.0 - 8.0   Protein, ur NEGATIVE  NEGATIVE mg/dL   Urobilinogen, UA 0.2  0.0 - 1.0 mg/dL   Nitrite NEGATIVE  NEGATIVE   Leukocytes, UA SMALL (*) NEGATIVE     Assessment:  The encounter diagnosis was Abdominal pain.  The differential diagnosis includes diverticulitis, small bowel obstruction, cancer, pancreatitis, or cholecystitis.  Plan:   1.  The following meds were prescribed:   New Prescriptions   No medications on file   2.  The patient was transferred to the emergency department via shuttle.  Reuben Likes, MD 01/21/12 1346

## 2012-01-21 NOTE — ED Notes (Signed)
Pt presented to ED with acute abdominal pain since last night with tenderness.She also gives history of nausea and vomiting.

## 2012-01-21 NOTE — H&P (Signed)
Brittany Porter is an 66 y.o. female. Patient was seen and examined on January 21, 2012 at 11:05 PM. PCP - Dr. Merri Brunette.   Chief Complaint: Abdominal pain with nausea vomiting. HPI: 66 year-old female with history of diabetes mellitus2, hyperlipidemia, cancer of the endometrium/breast/ovary and history of malignant melanoma presents with complaints of epigastric pain. The pain started out yesterday and today patient had 2-3 episodes of nausea vomiting. In the ER CT shows ventral hernia with possible partial obstruction. Patient does move flatus. At this time patient is already placed on NGT suction and has been admitted for further management. Patient denies any chest pain shortness of breath.  Past Medical History  Diagnosis Date  . Pleural effusion 04/2010  . Breast cancer   . Ovarian cancer   . Endometrial carcinoma 10/2008  . Iron deficiency   . Diabetes mellitus   . Hyperlipidemia   . Fluid overload   . Arthritis     Past Surgical History  Procedure Date  . Mastectomy 1997    Bilateral  . Abdominal hysterectomy 10/2008  . Melanoma removal 02/2009, 07/2009  . Knee arthroscopy 04/2010    Right knee  . Breast surgery 04/04/96    bil masty    Family History  Problem Relation Age of Onset  . Stroke Mother   . Cancer Father   . Colon cancer Sister   . Stroke Sister   . Cancer Sister     breast  . Heart attack Brother   . Cancer Brother     skin  . Skin cancer Brother   . Cancer Sister     colon   Social History:  reports that she has never smoked. She does not have any smokeless tobacco history on file. She reports that she does not drink alcohol or use illicit drugs.  Allergies:  Allergies  Allergen Reactions  . Sulfa Antibiotics     "Eyes swelled shut"     (Not in a hospital admission)  Results for orders placed during the hospital encounter of 01/21/12 (from the past 48 hour(s))  CBC WITH DIFFERENTIAL     Status: Abnormal   Collection Time   01/21/12   2:25 PM      Component Value Range Comment   WBC 9.6  4.0 - 10.5 K/uL    RBC 4.94  3.87 - 5.11 MIL/uL    Hemoglobin 14.7  12.0 - 15.0 g/dL    HCT 30.8  65.7 - 84.6 %    MCV 87.7  78.0 - 100.0 fL    MCH 29.8  26.0 - 34.0 pg    MCHC 33.9  30.0 - 36.0 g/dL    RDW 96.2  95.2 - 84.1 %    Platelets 184  150 - 400 K/uL    Neutrophils Relative 78 (*) 43 - 77 %    Neutro Abs 7.5  1.7 - 7.7 K/uL    Lymphocytes Relative 16  12 - 46 %    Lymphs Abs 1.6  0.7 - 4.0 K/uL    Monocytes Relative 5  3 - 12 %    Monocytes Absolute 0.5  0.1 - 1.0 K/uL    Eosinophils Relative 0  0 - 5 %    Eosinophils Absolute 0.0  0.0 - 0.7 K/uL    Basophils Relative 0  0 - 1 %    Basophils Absolute 0.0  0.0 - 0.1 K/uL   BASIC METABOLIC PANEL     Status: Abnormal  Collection Time   01/21/12  2:25 PM      Component Value Range Comment   Sodium 141  135 - 145 mEq/L    Potassium 4.1  3.5 - 5.1 mEq/L    Chloride 101  96 - 112 mEq/L    CO2 29  19 - 32 mEq/L    Glucose, Bld 217 (*) 70 - 99 mg/dL    BUN 12  6 - 23 mg/dL    Creatinine, Ser 1.61 (*) 0.50 - 1.10 mg/dL    Calcium 09.6  8.4 - 10.5 mg/dL    GFR calc non Af Amer >90  >90 mL/min    GFR calc Af Amer >90  >90 mL/min   LACTIC ACID, PLASMA     Status: Normal   Collection Time   01/21/12  4:17 PM      Component Value Range Comment   Lactic Acid, Venous 2.1  0.5 - 2.2 mmol/L   HEPATIC FUNCTION PANEL     Status: Normal   Collection Time   01/21/12  4:20 PM      Component Value Range Comment   Total Protein 7.0  6.0 - 8.3 g/dL    Albumin 3.8  3.5 - 5.2 g/dL    AST 30  0 - 37 U/L    ALT 25  0 - 35 U/L    Alkaline Phosphatase 49  39 - 117 U/L    Total Bilirubin 1.0  0.3 - 1.2 mg/dL    Bilirubin, Direct 0.2  0.0 - 0.3 mg/dL    Indirect Bilirubin 0.8  0.3 - 0.9 mg/dL   LIPASE, BLOOD     Status: Normal   Collection Time   01/21/12  4:20 PM      Component Value Range Comment   Lipase 34  11 - 59 U/L   URINALYSIS, ROUTINE W REFLEX MICROSCOPIC     Status: Abnormal    Collection Time   01/21/12  8:45 PM      Component Value Range Comment   Color, Urine YELLOW  YELLOW    APPearance CLOUDY (*) CLEAR    Specific Gravity, Urine 1.022  1.005 - 1.030    pH 7.5  5.0 - 8.0    Glucose, UA NEGATIVE  NEGATIVE mg/dL    Hgb urine dipstick NEGATIVE  NEGATIVE    Bilirubin Urine SMALL (*) NEGATIVE    Ketones, ur 15 (*) NEGATIVE mg/dL    Protein, ur NEGATIVE  NEGATIVE mg/dL    Urobilinogen, UA 1.0  0.0 - 1.0 mg/dL    Nitrite NEGATIVE  NEGATIVE    Leukocytes, UA MODERATE (*) NEGATIVE   URINE MICROSCOPIC-ADD ON     Status: Abnormal   Collection Time   01/21/12  8:45 PM      Component Value Range Comment   Squamous Epithelial / LPF FEW (*) RARE    WBC, UA 21-50  <3 WBC/hpf    RBC / HPF 0-2  <3 RBC/hpf    Bacteria, UA RARE  RARE    Ct Abdomen Pelvis W Contrast  01/21/2012  *RADIOLOGY REPORT*  Clinical Data: Mid abdominal pain.  Nausea and vomiting.  History of endometrial cancer.  History of breast cancer.  CT ABDOMEN AND PELVIS WITH CONTRAST  Technique:  Multidetector CT imaging of the abdomen and pelvis was performed following the standard protocol during bolus administration of intravenous contrast.  Contrast: OMNIPAQUE IOHEXOL 300 MG/ML  SOLN  Comparison: 03/30/2010  Findings: The lung bases demonstrate pleural thickening  and pleural calcification on the left with atelectasis and scarring in the left lung base.  Nodular scarring versus rounded atelectasis anteriorly on the left.  Pulmonary nodule is not excluded.  The pleural effusion has resolved since previous study.  Mild atelectasis in the right lung base.  Diffuse low attenuation change throughout the liver consistent with fatty infiltration.  Small esophageal hiatal hernia.  The gallbladder, spleen, pancreas, adrenal glands, kidneys, abdominal aorta, and retroperitoneal lymph nodes are unremarkable.  The stomach is not abnormally distended.  Stool filled colon without distension.  There is a midline anterior  abdominal wall hernia, likely postoperative.  The hernia contains bowel loops.  There is mild dilatation of small bowel proximal and distal to the hernia. There is decompression of the distal ileum.  Changes suggest early or partial small bowel obstruction, likely due to adhesions. Contrast material does pass into the nondistended bowel.  No free air or free fluid in the abdomen.  Pelvis:  Uterus is surgically absent.  No abnormal adnexal masses. No significant pelvic lymphadenopathy.  No free or loculated pelvic fluid collections.  Diverticula in the sigmoid colon without diverticulitis.  The appendix is not identified.  Degenerative changes in the lumbar spine.  IMPRESSION: Ventral abdominal wall hernia, likely incisional.  The hernia contains bowel.  There is mild distension of small bowel before and after the hernia compression of the terminal ileum.  Changes suggest partial obstruction, likely due to adhesions. Diffuse fatty infiltration of the liver. Pleural and parenchymal scarring in the left lung base with nodular scarring versus rounded atelectasis.   Original Report Authenticated By: Marlon Pel, M.D.    Dg Abd Acute W/chest  01/21/2012  *RADIOLOGY REPORT*  Clinical Data: Nausea and vomiting.  ACUTE ABDOMEN SERIES (ABDOMEN 2 VIEW & CHEST 1 VIEW)  Comparison: CT scan 03/30/2010.  Findings: The upright chest x-ray demonstrates a surgical changes from bilateral mastectomies.  There is a left pleural effusion and left basilar scarring changes.  Two views of the abdomen demonstrate an unremarkable/nonspecific bowel gas pattern.  There is scattered air and stool in the colon and scattered small bowel loops with air but no distention.  The soft tissue shadows are maintained.  No free air is identified. 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  IMPRESSION:  1.  Left pleural effusion and left basilar scarring. 2.  No plain film findings for an acute abdominal process.   Original Report  Authenticated By: P. Loralie Champagne, M.D.     Review of Systems  Constitutional: Negative.   HENT: Negative.   Eyes: Negative.   Respiratory: Negative.   Cardiovascular: Negative.   Gastrointestinal: Positive for nausea, vomiting and abdominal pain.  Genitourinary: Negative.   Musculoskeletal: Negative.   Skin: Negative.   Neurological: Negative.   Endo/Heme/Allergies: Negative.   Psychiatric/Behavioral: Negative.     Blood pressure 130/70, pulse 94, temperature 98.9 F (37.2 C), temperature source Oral, resp. rate 17, SpO2 96.00%. Physical Exam  Constitutional: She is oriented to person, place, and time. She appears well-developed and well-nourished. No distress.  HENT:  Head: Normocephalic and atraumatic.  Right Ear: External ear normal.  Left Ear: External ear normal.  Nose: Nose normal.  Mouth/Throat: Oropharynx is clear and moist. No oropharyngeal exudate.  Eyes: Conjunctivae are normal. Pupils are equal, round, and reactive to light. Right eye exhibits no discharge. Left eye exhibits no discharge. No scleral icterus.  Neck: Normal range of motion. Neck supple.  Cardiovascular: Normal rate and regular rhythm.  Respiratory: Effort normal and breath sounds normal. No respiratory distress. She has no wheezes. She has no rales.  GI: Soft. She exhibits no distension. There is no tenderness. There is no rebound.  Musculoskeletal: She exhibits no edema and no tenderness.  Neurological: She is alert and oriented to person, place, and time.       Moves all extremities.  Skin: Skin is warm and dry. She is not diaphoretic.     Assessment/Plan #1. Partial small bowel obstruction - n.p.o. NG tube suction. Further recommendations per surgery. #2. Diabetes mellitus type 2 - since patient is n.p.o. follow sliding scale coverage. #3. History of cancer of the endometrium/breast/ovaries and history of malignant melanoma - per oncology.  CODE STATUS - full code.  Eduard Clos. 01/21/2012, 11:29 PM

## 2012-01-21 NOTE — ED Provider Notes (Signed)
History     CSN: [id number]  Arrival date & time 01/21/12  1403   First MD Initiated Contact with Patient 01/21/12 1544      Chief Complaint  Patient presents with  . Abdominal Pain    (Consider location/radiation/quality/duration/timing/severity/associated sxs/prior treatment) HPI Comments: Since last night,. More freq and worse episodes of waxing and waning abd pain, squeezing, like spasms, but worse than labor pains, mostly in sides and upper abdomen and epigastrium.  Associated with N/V, no fevers, has decreased flatus and none since arrival to the ED and feels bloated.  No back pain, no CP, SOB, sweats.  Pt has had abd hysterectomy due to h/o cancer in the past. Pt denies prior h/o similar.  She also has had appendectomy at same time as hysterectomy.    Patient is a 66 y.o. female presenting with abdominal pain. The history is provided by the patient, a relative and medical records.  Abdominal Pain The primary symptoms of the illness include abdominal pain, nausea and vomiting. The primary symptoms of the illness do not include fever, shortness of breath or diarrhea.  Symptoms associated with the illness do not include frequency or back pain.    Past Medical History  Diagnosis Date  . Pleural effusion 04/2010  . Breast cancer   . Ovarian cancer   . Endometrial carcinoma 10/2008  . Iron deficiency   . Diabetes mellitus   . Hyperlipidemia   . Fluid overload   . Arthritis     Past Surgical History  Procedure Date  . Mastectomy 1997    Bilateral  . Abdominal hysterectomy 10/2008  . Melanoma removal 02/2009, 07/2009  . Knee arthroscopy 04/2010    Right knee  . Breast surgery 04/04/96    bil masty    Family History  Problem Relation Age of Onset  . Stroke Mother   . Cancer Father   . Colon cancer Sister   . Stroke Sister   . Cancer Sister     breast  . Heart attack Brother   . Cancer Brother     skin  . Skin cancer Brother   . Cancer Sister     colon     History  Substance Use Topics  . Smoking status: Never Smoker   . Smokeless tobacco: Not on file  . Alcohol Use: No    OB History    Grav Para Term Preterm Abortions TAB SAB Ect Mult Living                  Review of Systems  Constitutional: Positive for appetite change. Negative for fever.  HENT: Negative for congestion and rhinorrhea.   Respiratory: Negative for cough, chest tightness and shortness of breath.   Cardiovascular: Negative for chest pain.  Gastrointestinal: Positive for nausea, vomiting and abdominal pain. Negative for diarrhea.  Genitourinary: Negative for frequency and flank pain.  Musculoskeletal: Negative for back pain.  Neurological: Negative for headaches.  All other systems reviewed and are negative.    Allergies  Sulfa antibiotics  Home Medications   Current Outpatient Rx  Name Route Sig Dispense Refill  . CALCIUM CARBONATE-VITAMIN D 600-400 MG-UNIT PO TABS Oral Take 2 tablets by mouth daily.    Marland Kitchen VITAMIN D3 2000 UNITS PO TABS Oral Take 2,000 mg by mouth daily.    Marland Kitchen FERROUS SULFATE 325 (65 FE) MG PO TABS Oral Take 325 mg by mouth daily with breakfast.      . OMEGA-3 FATTY ACIDS 1000 MG  PO CAPS Oral Take 2 g by mouth daily.      . FUROSEMIDE 20 MG PO TABS Oral Take 20 mg by mouth daily.     Marland Kitchen LETROZOLE 2.5 MG PO TABS Oral Take 2.5 mg by mouth daily.      . MELOXICAM 7.5 MG PO TABS Oral Take 7.5 mg by mouth 2 (two) times daily.     Marland Kitchen METFORMIN HCL 500 MG PO TABS Oral Take 500 mg by mouth 2 (two) times daily with a meal.      . PROCHLORPERAZINE MALEATE 10 MG PO TABS Oral Take 10 mg by mouth as needed. For nausea    . SIMVASTATIN 40 MG PO TABS Oral Take 40 mg by mouth at bedtime.        BP 130/70  Pulse 94  Temp 98.9 F (37.2 C) (Oral)  Resp 17  SpO2 96%  Physical Exam  Nursing note and vitals reviewed. Constitutional: She is oriented to person, place, and time. She appears well-developed and well-nourished.  HENT:  Head: Normocephalic  and atraumatic.  Eyes: EOM are normal. Pupils are equal, round, and reactive to light. No scleral icterus.  Neck: Normal range of motion. Neck supple.  Cardiovascular: Normal rate and regular rhythm.   No murmur heard. Pulmonary/Chest: Effort normal. No respiratory distress.  Abdominal: Soft. Normal appearance. She exhibits distension. There is tenderness. There is no rebound, no guarding and no CVA tenderness.  Neurological: She is alert and oriented to person, place, and time.  Skin: Skin is warm. No rash noted.  Psychiatric: She has a normal mood and affect.    ED Course  Procedures (including critical care time)  Labs Reviewed  CBC WITH DIFFERENTIAL - Abnormal; Notable for the following:    Neutrophils Relative 78 (*)     All other components within normal limits  BASIC METABOLIC PANEL - Abnormal; Notable for the following:    Glucose, Bld 217 (*)     Creatinine, Ser 0.47 (*)     All other components within normal limits  URINALYSIS, ROUTINE W REFLEX MICROSCOPIC - Abnormal; Notable for the following:    APPearance CLOUDY (*)     Bilirubin Urine SMALL (*)     Ketones, ur 15 (*)     Leukocytes, UA MODERATE (*)     All other components within normal limits  URINE MICROSCOPIC-ADD ON - Abnormal; Notable for the following:    Squamous Epithelial / LPF FEW (*)     All other components within normal limits  LACTIC ACID, PLASMA  HEPATIC FUNCTION PANEL  LIPASE, BLOOD   Ct Abdomen Pelvis W Contrast  01/21/2012  *RADIOLOGY REPORT*  Clinical Data: Mid abdominal pain.  Nausea and vomiting.  History of endometrial cancer.  History of breast cancer.  CT ABDOMEN AND PELVIS WITH CONTRAST  Technique:  Multidetector CT imaging of the abdomen and pelvis was performed following the standard protocol during bolus administration of intravenous contrast.  Contrast: OMNIPAQUE IOHEXOL 300 MG/ML  SOLN  Comparison: 03/30/2010  Findings: The lung bases demonstrate pleural thickening and pleural  calcification on the left with atelectasis and scarring in the left lung base.  Nodular scarring versus rounded atelectasis anteriorly on the left.  Pulmonary nodule is not excluded.  The pleural effusion has resolved since previous study.  Mild atelectasis in the right lung base.  Diffuse low attenuation change throughout the liver consistent with fatty infiltration.  Small esophageal hiatal hernia.  The gallbladder, spleen, pancreas, adrenal glands, kidneys,  abdominal aorta, and retroperitoneal lymph nodes are unremarkable.  The stomach is not abnormally distended.  Stool filled colon without distension.  There is a midline anterior abdominal wall hernia, likely postoperative.  The hernia contains bowel loops.  There is mild dilatation of small bowel proximal and distal to the hernia. There is decompression of the distal ileum.  Changes suggest early or partial small bowel obstruction, likely due to adhesions. Contrast material does pass into the nondistended bowel.  No free air or free fluid in the abdomen.  Pelvis:  Uterus is surgically absent.  No abnormal adnexal masses. No significant pelvic lymphadenopathy.  No free or loculated pelvic fluid collections.  Diverticula in the sigmoid colon without diverticulitis.  The appendix is not identified.  Degenerative changes in the lumbar spine.  IMPRESSION: Ventral abdominal wall hernia, likely incisional.  The hernia contains bowel.  There is mild distension of small bowel before and after the hernia compression of the terminal ileum.  Changes suggest partial obstruction, likely due to adhesions. Diffuse fatty infiltration of the liver. Pleural and parenchymal scarring in the left lung base with nodular scarring versus rounded atelectasis.   Original Report Authenticated By: Marlon Pel, M.D.    Dg Abd Acute W/chest  01/21/2012  *RADIOLOGY REPORT*  Clinical Data: Nausea and vomiting.  ACUTE ABDOMEN SERIES (ABDOMEN 2 VIEW & CHEST 1 VIEW)  Comparison: CT scan  03/30/2010.  Findings: The upright chest x-ray demonstrates a surgical changes from bilateral mastectomies.  There is a left pleural effusion and left basilar scarring changes.  Two views of the abdomen demonstrate an unremarkable/nonspecific bowel gas pattern.  There is scattered air and stool in the colon and scattered small bowel loops with air but no distention.  The soft tissue shadows are maintained.  No free air is identified. 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  IMPRESSION:  1.  Left pleural effusion and left basilar scarring. 2.  No plain film findings for an acute abdominal process.   Original Report Authenticated By: P. Loralie Champagne, M.D.      1. Partial bowel obstruction     9:23 PM Morphine continues to aid in pain.  CT shows partial obstruction, ventral hernia present.  Will consult surgery,.     10:32 PM Seen by Dr. Dwain Sarna, RN to place NGT, spoke to [facility] hospitalist to admit  MDM  Pt with symptoms to suggest SBO.  Plain films were not revealing.  Morphine provided good but temporary relief, required 2 doses.  NPO, will get CT Scan to assess for partial obstruction, possibly biliary cause.  Not septic, normotensive.          Gavin Pound. Oletta Lamas, MD 01/21/12 2232

## 2012-01-21 NOTE — ED Notes (Signed)
Pt. Stated, I've had abd. Pain since yesterday with vomiting this morning.

## 2012-01-22 ENCOUNTER — Inpatient Hospital Stay (HOSPITAL_COMMUNITY): Payer: Medicare Other

## 2012-01-22 ENCOUNTER — Encounter (HOSPITAL_COMMUNITY): Payer: Self-pay | Admitting: *Deleted

## 2012-01-22 DIAGNOSIS — Z09 Encounter for follow-up examination after completed treatment for conditions other than malignant neoplasm: Secondary | ICD-10-CM | POA: Diagnosis not present

## 2012-01-22 DIAGNOSIS — C569 Malignant neoplasm of unspecified ovary: Secondary | ICD-10-CM | POA: Diagnosis not present

## 2012-01-22 DIAGNOSIS — K5730 Diverticulosis of large intestine without perforation or abscess without bleeding: Secondary | ICD-10-CM | POA: Diagnosis not present

## 2012-01-22 DIAGNOSIS — E119 Type 2 diabetes mellitus without complications: Secondary | ICD-10-CM | POA: Diagnosis not present

## 2012-01-22 DIAGNOSIS — K56609 Unspecified intestinal obstruction, unspecified as to partial versus complete obstruction: Secondary | ICD-10-CM | POA: Diagnosis not present

## 2012-01-22 LAB — COMPREHENSIVE METABOLIC PANEL
ALT: 33 U/L (ref 0–35)
AST: 45 U/L — ABNORMAL HIGH (ref 0–37)
Albumin: 3.2 g/dL — ABNORMAL LOW (ref 3.5–5.2)
Alkaline Phosphatase: 44 U/L (ref 39–117)
BUN: 14 mg/dL (ref 6–23)
CO2: 29 mEq/L (ref 19–32)
Calcium: 8.5 mg/dL (ref 8.4–10.5)
Chloride: 104 mEq/L (ref 96–112)
Creatinine, Ser: 0.54 mg/dL (ref 0.50–1.10)
GFR calc Af Amer: >90 mL/min (ref >90)
GFR calc non Af Amer: >90 mL/min (ref >90)
Glucose, Bld: 175 mg/dL — ABNORMAL HIGH (ref 70–99)
Potassium: 3.4 mEq/L — ABNORMAL LOW (ref 3.5–5.1)
Sodium: 141 mEq/L (ref 135–145)
Total Bilirubin: 0.9 mg/dL (ref 0.3–1.2)
Total Protein: 6 g/dL (ref 6.0–8.3)

## 2012-01-22 LAB — CBC WITH DIFFERENTIAL/PLATELET
Basophils Absolute: 0 10*3/uL (ref 0.0–0.1)
Basophils Relative: 0 % (ref 0–1)
Eosinophils Absolute: 0.1 10*3/uL (ref 0.0–0.7)
Eosinophils Relative: 2 % (ref 0–5)
HCT: 37.8 % (ref 36.0–46.0)
Hemoglobin: 12.2 g/dL (ref 12.0–15.0)
Lymphocytes Relative: 27 % (ref 12–46)
Lymphs Abs: 1.4 10*3/uL (ref 0.7–4.0)
MCH: 28.8 pg (ref 26.0–34.0)
MCHC: 32.3 g/dL (ref 30.0–36.0)
MCV: 89.2 fL (ref 78.0–100.0)
Monocytes Absolute: 0.4 10*3/uL (ref 0.1–1.0)
Monocytes Relative: 8 % (ref 3–12)
Neutro Abs: 3.2 10*3/uL (ref 1.7–7.7)
Neutrophils Relative %: 62 % (ref 43–77)
Platelets: 145 10*3/uL — ABNORMAL LOW (ref 150–400)
RBC: 4.24 MIL/uL (ref 3.87–5.11)
RDW: 13.6 % (ref 11.5–15.5)
WBC: 5.1 10*3/uL (ref 4.0–10.5)

## 2012-01-22 LAB — GLUCOSE, CAPILLARY
Glucose-Capillary: 183 mg/dL — ABNORMAL HIGH (ref 70–99)
Glucose-Capillary: 154 mg/dL — ABNORMAL HIGH (ref 70–99)
Glucose-Capillary: 119 mg/dL — ABNORMAL HIGH (ref 70–99)
Glucose-Capillary: 94 mg/dL (ref 70–99)
Glucose-Capillary: 118 mg/dL — ABNORMAL HIGH (ref 70–99)

## 2012-01-22 MED ORDER — BIOTENE DRY MOUTH MT LIQD
15.0000 mL | Freq: Two times a day (BID) | OROMUCOSAL | Status: DC
  Administered 2012-01-22 – 2012-01-24 (×5): 15 mL via OROMUCOSAL

## 2012-01-22 MED ORDER — ONDANSETRON HCL 4 MG/2ML IJ SOLN: 4.0000 mg | Freq: Three times a day (TID) | INTRAMUSCULAR | Status: AC | PRN

## 2012-01-22 MED ORDER — INSULIN ASPART 100 UNIT/ML ~~LOC~~ SOLN
0.0000 [IU] | Freq: Three times a day (TID) | SUBCUTANEOUS | Status: DC
  Administered 2012-01-24: 2 [IU] via SUBCUTANEOUS
  Administered 2012-01-24: 1 [IU] via SUBCUTANEOUS
  Administered 2012-01-25: 3 [IU] via SUBCUTANEOUS

## 2012-01-22 MED ORDER — PHENOL 1.4 % MT LIQD
1.0000 | OROMUCOSAL | Status: DC | PRN
  Administered 2012-01-22: 1 via OROMUCOSAL
  Filled 2012-01-22 (×2): qty 177

## 2012-01-22 MED ORDER — CHLORHEXIDINE GLUCONATE 0.12 % MT SOLN
15.0000 mL | Freq: Two times a day (BID) | OROMUCOSAL | Status: DC
  Administered 2012-01-22 – 2012-01-24 (×5): 15 mL via OROMUCOSAL
  Filled 2012-01-22 (×4): qty 15

## 2012-01-22 MED ORDER — ONDANSETRON HCL 4 MG/2ML IJ SOLN: 4.0000 mg | Freq: Four times a day (QID) | INTRAMUSCULAR | Status: DC | PRN

## 2012-01-22 MED ORDER — ACETAMINOPHEN 325 MG PO TABS
650.0000 mg | ORAL_TABLET | Freq: Four times a day (QID) | ORAL | Status: DC | PRN
  Administered 2012-01-24: 650 mg via ORAL
  Filled 2012-01-22: qty 2

## 2012-01-22 MED ORDER — ACETAMINOPHEN 650 MG RE SUPP: 650.0000 mg | Freq: Four times a day (QID) | RECTAL | Status: DC | PRN

## 2012-01-22 MED ORDER — SODIUM CHLORIDE 0.9 % IV SOLN
INTRAVENOUS | Status: AC
  Administered 2012-01-22 (×2): via INTRAVENOUS

## 2012-01-22 MED ORDER — ONDANSETRON HCL 4 MG PO TABS: 4.0000 mg | ORAL_TABLET | Freq: Four times a day (QID) | ORAL | Status: DC | PRN

## 2012-01-22 MED ORDER — SODIUM CHLORIDE 0.9 % IV SOLN
INTRAVENOUS | Status: AC
  Administered 2012-01-22: via INTRAVENOUS

## 2012-01-22 MED ORDER — HYDROMORPHONE HCL PF 1 MG/ML IJ SOLN
0.5000 mg | INTRAMUSCULAR | Status: DC | PRN
  Administered 2012-01-22 – 2012-01-23 (×4): 0.5 mg via INTRAVENOUS
  Administered 2012-01-23: 07:00:00 via INTRAVENOUS
  Filled 2012-01-22 (×5): qty 1

## 2012-01-22 NOTE — Progress Notes (Signed)
Partial small bowel obstruction  Subjective: She feels much better today. Basically no pain. Main complaint of sore throat from the NG tube. She has not passed gas or had a bowel movement since arrival at the hospital yesterday.  Objective: Vital signs in last 24 hours: Temp:  [98 F (36.7 C)-99.5 F (37.5 C)] 98 F (36.7 C) (09/08 0606) Pulse Rate:  [73-97] 73  (09/08 0606) Resp:  [13-25] 18  (09/08 0606) BP: (91-165)/(57-96) 131/78 mmHg (09/08 0606) SpO2:  [93 %-99 %] 99 % (09/08 0606) Weight:  [225 lb (102.059 kg)] 225 lb (102.059 kg) (09/08 0022) Last BM Date: 01/21/12  Intake/Output from previous day: 09/07 0701 - 09/08 0700 In: 865 [I.V.:865] Out: -  Intake/Output this shift:    General appearance: alert, cooperative and no distress Resp: clear to auscultation bilaterally GI: soft, mildly distended, not tender, no masses, few bowel sounds heard. Midline is fairly soft not sure I can appreciate a hernia but i does appear to be any evidence of incarceration or strangulation or compromised bowel in the hernia was noted on the CT scan yesterday  Lab Results:  Results for orders placed during the hospital encounter of 01/21/12 (from the past 24 hour(s))  CBC WITH DIFFERENTIAL     Status: Abnormal   Collection Time   01/21/12  2:25 PM      Component Value Range   WBC 9.6  4.0 - 10.5 K/uL   RBC 4.94  3.87 - 5.11 MIL/uL   Hemoglobin 14.7  12.0 - 15.0 g/dL   HCT 81.1  91.4 - 78.2 %   MCV 87.7  78.0 - 100.0 fL   MCH 29.8  26.0 - 34.0 pg   MCHC 33.9  30.0 - 36.0 g/dL   RDW 95.6  21.3 - 08.6 %   Platelets 184  150 - 400 K/uL   Neutrophils Relative 78 (*) 43 - 77 %   Neutro Abs 7.5  1.7 - 7.7 K/uL   Lymphocytes Relative 16  12 - 46 %   Lymphs Abs 1.6  0.7 - 4.0 K/uL   Monocytes Relative 5  3 - 12 %   Monocytes Absolute 0.5  0.1 - 1.0 K/uL   Eosinophils Relative 0  0 - 5 %   Eosinophils Absolute 0.0  0.0 - 0.7 K/uL   Basophils Relative 0  0 - 1 %   Basophils Absolute 0.0   0.0 - 0.1 K/uL  BASIC METABOLIC PANEL     Status: Abnormal   Collection Time   01/21/12  2:25 PM      Component Value Range   Sodium 141  135 - 145 mEq/L   Potassium 4.1  3.5 - 5.1 mEq/L   Chloride 101  96 - 112 mEq/L   CO2 29  19 - 32 mEq/L   Glucose, Bld 217 (*) 70 - 99 mg/dL   BUN 12  6 - 23 mg/dL   Creatinine, Ser 5.78 (*) 0.50 - 1.10 mg/dL   Calcium 46.9  8.4 - 62.9 mg/dL   GFR calc non Af Amer >90  >90 mL/min   GFR calc Af Amer >90  >90 mL/min  LACTIC ACID, PLASMA     Status: Normal   Collection Time   01/21/12  4:17 PM      Component Value Range   Lactic Acid, Venous 2.1  0.5 - 2.2 mmol/L  HEPATIC FUNCTION PANEL     Status: Normal   Collection Time   01/21/12  4:20  PM      Component Value Range   Total Protein 7.0  6.0 - 8.3 g/dL   Albumin 3.8  3.5 - 5.2 g/dL   AST 30  0 - 37 U/L   ALT 25  0 - 35 U/L   Alkaline Phosphatase 49  39 - 117 U/L   Total Bilirubin 1.0  0.3 - 1.2 mg/dL   Bilirubin, Direct 0.2  0.0 - 0.3 mg/dL   Indirect Bilirubin 0.8  0.3 - 0.9 mg/dL  LIPASE, BLOOD     Status: Normal   Collection Time   01/21/12  4:20 PM      Component Value Range   Lipase 34  11 - 59 U/L  URINALYSIS, ROUTINE W REFLEX MICROSCOPIC     Status: Abnormal   Collection Time   01/21/12  8:45 PM      Component Value Range   Color, Urine YELLOW  YELLOW   APPearance CLOUDY (*) CLEAR   Specific Gravity, Urine 1.022  1.005 - 1.030   pH 7.5  5.0 - 8.0   Glucose, UA NEGATIVE  NEGATIVE mg/dL   Hgb urine dipstick NEGATIVE  NEGATIVE   Bilirubin Urine SMALL (*) NEGATIVE   Ketones, ur 15 (*) NEGATIVE mg/dL   Protein, ur NEGATIVE  NEGATIVE mg/dL   Urobilinogen, UA 1.0  0.0 - 1.0 mg/dL   Nitrite NEGATIVE  NEGATIVE   Leukocytes, UA MODERATE (*) NEGATIVE  URINE MICROSCOPIC-ADD ON     Status: Abnormal   Collection Time   01/21/12  8:45 PM      Component Value Range   Squamous Epithelial / LPF FEW (*) RARE   WBC, UA 21-50  <3 WBC/hpf   RBC / HPF 0-2  <3 RBC/hpf   Bacteria, UA RARE  RARE    GLUCOSE, CAPILLARY     Status: Abnormal   Collection Time   01/21/12 11:38 PM      Component Value Range   Glucose-Capillary 179 (*) 70 - 99 mg/dL  GLUCOSE, CAPILLARY     Status: Abnormal   Collection Time   01/22/12  4:20 AM      Component Value Range   Glucose-Capillary 183 (*) 70 - 99 mg/dL   Comment 1 Notify RN     Comment 2 Documented in Chart    COMPREHENSIVE METABOLIC PANEL     Status: Abnormal   Collection Time   01/22/12  6:30 AM      Component Value Range   Sodium 141  135 - 145 mEq/L   Potassium 3.4 (*) 3.5 - 5.1 mEq/L   Chloride 104  96 - 112 mEq/L   CO2 29  19 - 32 mEq/L   Glucose, Bld 175 (*) 70 - 99 mg/dL   BUN 14  6 - 23 mg/dL   Creatinine, Ser 1.19  0.50 - 1.10 mg/dL   Calcium 8.5  8.4 - 14.7 mg/dL   Total Protein 6.0  6.0 - 8.3 g/dL   Albumin 3.2 (*) 3.5 - 5.2 g/dL   AST 45 (*) 0 - 37 U/L   ALT 33  0 - 35 U/L   Alkaline Phosphatase 44  39 - 117 U/L   Total Bilirubin 0.9  0.3 - 1.2 mg/dL   GFR calc non Af Amer >90  >90 mL/min   GFR calc Af Amer >90  >90 mL/min  CBC WITH DIFFERENTIAL     Status: Abnormal   Collection Time   01/22/12  6:30 AM  Component Value Range   WBC 5.1  4.0 - 10.5 K/uL   RBC 4.24  3.87 - 5.11 MIL/uL   Hemoglobin 12.2  12.0 - 15.0 g/dL   HCT 11.9  14.7 - 82.9 %   MCV 89.2  78.0 - 100.0 fL   MCH 28.8  26.0 - 34.0 pg   MCHC 32.3  30.0 - 36.0 g/dL   RDW 56.2  13.0 - 86.5 %   Platelets 145 (*) 150 - 400 K/uL   Neutrophils Relative 62  43 - 77 %   Neutro Abs 3.2  1.7 - 7.7 K/uL   Lymphocytes Relative 27  12 - 46 %   Lymphs Abs 1.4  0.7 - 4.0 K/uL   Monocytes Relative 8  3 - 12 %   Monocytes Absolute 0.4  0.1 - 1.0 K/uL   Eosinophils Relative 2  0 - 5 %   Eosinophils Absolute 0.1  0.0 - 0.7 K/uL   Basophils Relative 0  0 - 1 %   Basophils Absolute 0.0  0.0 - 0.1 K/uL  GLUCOSE, CAPILLARY     Status: Abnormal   Collection Time   01/22/12  8:33 AM      Component Value Range   Glucose-Capillary 154 (*) 70 - 99 mg/dL      Studies/Results Radiology     MEDS, Scheduled    . sodium chloride   Intravenous Once  . sodium chloride   Intravenous STAT  . antiseptic oral rinse  15 mL Mouth Rinse q12n4p  . chlorhexidine  15 mL Mouth Rinse BID  . HYDROmorphone  1 mg Intravenous Once  . insulin aspart  0-9 Units Subcutaneous TID WC  . iohexol  20 mL Oral Q1 Hr x 2  .  morphine injection  4 mg Intravenous Once  . ondansetron (ZOFRAN) IV  4 mg Intravenous Once  . sodium chloride  500 mL Intravenous Once     Assessment: Partial small bowel obstruction Stable Mild hypokalemia  Plan: Will recheck KUB and upright today. Will give her some Chloraseptic spray for sore throat.  LOS: 1 day    Currie Paris, MD, [facility], [place] [phone]   01/22/2012 9:38 AM

## 2012-01-22 NOTE — Progress Notes (Signed)
[facility] PROGRESS NOTE  BERNADEAN SALING ZOX:[id number] DOB: 07-01-1945 DOA: 01/21/2012 PCP: Allean Found, MD  Assessment/Plan: Principal Problem:  *Partial small bowel obstruction Active Problems:  DM2 (diabetes mellitus, type 2)  Hyperlipidemia  1.#1. Partial small bowel obstruction - n.p.o. NG tube suction. Further recommendations per surgery. Repeat KUB shows resolution of the SBO, and she has good BM'S. #2. Diabetes mellitus type 2 - since patient is n.p.o. follow sliding scale coverage.  #3. History of cancer of the endometrium/breast/ovaries and history of malignant melanoma - per oncology as outpatient   Brief narrative: 66 year-old female with history of diabetes mellitus2, hyperlipidemia, cancer of the endometrium/breast/ovary and history of malignant melanoma presents with complaints of epigastric pain. The pain started out yesterday and today patient had 2-3 episodes of nausea vomiting. In the ER CT shows ventral hernia with possible partial obstruction. Patient does move flatus. At this time patient is already placed on NGT suction and has been admitted for further management.   Consultants:  Surgery    Procedures:  KUB  Antibiotics:  none  HPI/Subjective: Feel better  Objective: Filed Vitals:   01/22/12 0022 01/22/12 0606 01/22/12 1452 01/22/12 1852  BP: 143/64 131/78 130/81 134/41  Pulse: 81 73 80 64  Temp: 98.8 F (37.1 C) 98 F (36.7 C) 98.2 F (36.8 C) 99.3 F (37.4 C)  TempSrc: Oral     Resp: 18 18 18 18   Height: 5\' 3"  (1.6 m)     Weight: 102.059 kg (225 lb)     SpO2: 93% 99% 98% 98%    Intake/Output Summary (Last 24 hours) at 01/22/12 2000 Last data filed at 01/22/12 1842  Gross per 24 hour  Intake   1380 ml  Output   1300 ml  Net     80 ml   Filed Weights   01/22/12 0022  Weight: 102.059 kg (225 lb)    Exam:   General: alert afebrile comfortable, NG tube in place and on suction  Cardiovascular:  S1S2  Respiratory: CTAB  Abdomen: soft NTND BS+  Data Reviewed: Basic Metabolic Panel:  Lab 01/22/12 8119 01/21/12 1425  NA 141 141  K 3.4* 4.1  CL 104 101  CO2 29 29  GLUCOSE 175* 217*  BUN 14 12  CREATININE 0.54 0.47*  CALCIUM 8.5 10.0  MG -- --  PHOS -- --   Liver Function Tests:  Lab 01/22/12 0630 01/21/12 1620  AST 45* 30  ALT 33 25  ALKPHOS 44 49  BILITOT 0.9 1.0  PROT 6.0 7.0  ALBUMIN 3.2* 3.8    Lab 01/21/12 1620  LIPASE 34  AMYLASE --   No results found for this basename: AMMONIA:5 in the last 168 hours CBC:  Lab 01/22/12 0630 01/21/12 1425  WBC 5.1 9.6  NEUTROABS 3.2 7.5  HGB 12.2 14.7  HCT 37.8 43.3  MCV 89.2 87.7  PLT 145* 184   Cardiac Enzymes: No results found for this basename: CKTOTAL:5,CKMB:5,CKMBINDEX:5,TROPONINI:5 in the last 168 hours BNP (last 3 results) No results found for this basename: PROBNP:3 in the last 8760 hours CBG:  Lab 01/22/12 1651 01/22/12 1202 01/22/12 0833 01/22/12 0420 01/21/12 2338  GLUCAP 94 119* 154* 183* 179*    No results found for this or any previous visit (from the past 240 hour(s)).   Studies: Ct Abdomen Pelvis W Contrast  01/21/2012  *RADIOLOGY REPORT*  Clinical Data: Mid abdominal pain.  Nausea and vomiting.  History of endometrial cancer.  History of breast cancer.  CT ABDOMEN AND PELVIS WITH CONTRAST  Technique:  Multidetector CT imaging of the abdomen and pelvis was performed following the standard protocol during bolus administration of intravenous contrast.  Contrast: OMNIPAQUE IOHEXOL 300 MG/ML  SOLN  Comparison: 03/30/2010  Findings: The lung bases demonstrate pleural thickening and pleural calcification on the left with atelectasis and scarring in the left lung base.  Nodular scarring versus rounded atelectasis anteriorly on the left.  Pulmonary nodule is not excluded.  The pleural effusion has resolved since previous study.  Mild atelectasis in the right lung base.  Diffuse low attenuation  change throughout the liver consistent with fatty infiltration.  Small esophageal hiatal hernia.  The gallbladder, spleen, pancreas, adrenal glands, kidneys, abdominal aorta, and retroperitoneal lymph nodes are unremarkable.  The stomach is not abnormally distended.  Stool filled colon without distension.  There is a midline anterior abdominal wall hernia, likely postoperative.  The hernia contains bowel loops.  There is mild dilatation of small bowel proximal and distal to the hernia. There is decompression of the distal ileum.  Changes suggest early or partial small bowel obstruction, likely due to adhesions. Contrast material does pass into the nondistended bowel.  No free air or free fluid in the abdomen.  Pelvis:  Uterus is surgically absent.  No abnormal adnexal masses. No significant pelvic lymphadenopathy.  No free or loculated pelvic fluid collections.  Diverticula in the sigmoid colon without diverticulitis.  The appendix is not identified.  Degenerative changes in the lumbar spine.  IMPRESSION: Ventral abdominal wall hernia, likely incisional.  The hernia contains bowel.  There is mild distension of small bowel before and after the hernia compression of the terminal ileum.  Changes suggest partial obstruction, likely due to adhesions. Diffuse fatty infiltration of the liver. Pleural and parenchymal scarring in the left lung base with nodular scarring versus rounded atelectasis.   Original Report Authenticated By: Marlon Pel, M.D.    Dg Abd 2 Views  01/22/2012  *RADIOLOGY REPORT*  Clinical Data: Follow-up partial small bowel obstruction.  ABDOMEN - 2 VIEW  Comparison: CT abdomen pelvis 01/21/2012  Findings:  There are bilateral axillary surgical clips.  History of bilateral mastectomies.  A nasogastric tube terminates in the distal stomach.  There is cardiomegaly with bibasilar atelectasis, and probable small left pleural effusion.  The stomach is not distended.  Oral contrast is seen within  normal caliber colon, from yesterday's CT examination.  Multiple colonic diverticula are demonstrated in the distal colon.  There is also contrast within the urinary bladder, consistent with excreted contrast from intravenous contrast injection for CT.  There is a small right-sided urinary bladder diverticulum, out aligned with contrast.  There are no dilated small bowel loops.  No contrast is seen within small bowel loops.  No evidence of free intraperitoneal air.  IMPRESSION: 1. Resolution of small bowel dilatation since 01/21/2012.  No evidence of bowel obstruction at this time. Oral contrast is seen within normal caliber colon. 2.  The stomach is decompressed by a nasogastric tube. 3.  Colonic diverticulosis. 4.  Bibasilar atelectasis and/or airspace disease and small left pleural effusion.   Original Report Authenticated By: Britta Mccreedy, M.D.    Dg Abd Acute W/chest  01/21/2012  *RADIOLOGY REPORT*  Clinical Data: Nausea and vomiting.  ACUTE ABDOMEN SERIES (ABDOMEN 2 VIEW & CHEST 1 VIEW)  Comparison: CT scan 03/30/2010.  Findings: The upright chest x-ray demonstrates a surgical changes from bilateral mastectomies.  There is a left pleural effusion and left  basilar scarring changes.  Two views of the abdomen demonstrate an unremarkable/nonspecific bowel gas pattern.  There is scattered air and stool in the colon and scattered small bowel loops with air but no distention.  The soft tissue shadows are maintained.  No free air is identified. 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  IMPRESSION:  1.  Left pleural effusion and left basilar scarring. 2.  No plain film findings for an acute abdominal process.   Original Report Authenticated By: P. Loralie Champagne, M.D.     Scheduled Meds:   . sodium chloride   Intravenous STAT  . antiseptic oral rinse  15 mL Mouth Rinse q12n4p  . chlorhexidine  15 mL Mouth Rinse BID  . insulin aspart  0-9 Units Subcutaneous TID WC  . iohexol  20 mL Oral Q1 Hr x 2    Continuous Infusions:   . sodium chloride 100 mL/hr at 01/22/12 1839    Principal Problem:  *Partial small bowel obstruction Active Problems:  DM2 (diabetes mellitus, type 2)  Hyperlipidemia        Mahmood Boehringer  [facility] Pager [phone] If 8PM-8AM, please contact night-coverage at [email or web address], password [facility] 01/22/2012, 8:00 PM  LOS: 1 day

## 2012-01-23 ENCOUNTER — Inpatient Hospital Stay (HOSPITAL_COMMUNITY): Payer: Medicare Other

## 2012-01-23 DIAGNOSIS — E785 Hyperlipidemia, unspecified: Secondary | ICD-10-CM | POA: Diagnosis not present

## 2012-01-23 DIAGNOSIS — K56609 Unspecified intestinal obstruction, unspecified as to partial versus complete obstruction: Secondary | ICD-10-CM | POA: Diagnosis not present

## 2012-01-23 DIAGNOSIS — C569 Malignant neoplasm of unspecified ovary: Secondary | ICD-10-CM | POA: Diagnosis not present

## 2012-01-23 DIAGNOSIS — E119 Type 2 diabetes mellitus without complications: Secondary | ICD-10-CM | POA: Diagnosis not present

## 2012-01-23 LAB — GLUCOSE, CAPILLARY
Glucose-Capillary: 107 mg/dL — ABNORMAL HIGH (ref 70–99)
Glucose-Capillary: 125 mg/dL — ABNORMAL HIGH (ref 70–99)
Glucose-Capillary: 128 mg/dL — ABNORMAL HIGH (ref 70–99)
Glucose-Capillary: 129 mg/dL — ABNORMAL HIGH (ref 70–99)
Glucose-Capillary: 137 mg/dL — ABNORMAL HIGH (ref 70–99)
Glucose-Capillary: 165 mg/dL — ABNORMAL HIGH (ref 70–99)

## 2012-01-23 NOTE — Progress Notes (Signed)
Please clarify CBG order as well as IV.

## 2012-01-23 NOTE — Progress Notes (Signed)
Doing well since NGT has been pulled out.  Will look to advance diet tomorrow.  This patient has been seen and I agree with the findings and treatment plan.  Marta Lamas. Gae Bon, MD, FACS [phone] (pager) [phone] (direct pager) Trauma Surgeon

## 2012-01-23 NOTE — Progress Notes (Signed)
Doing well since her NGT has been taken out.  Brittany Porter. Gae Bon, MD, FACS [phone] [facility]

## 2012-01-23 NOTE — Progress Notes (Signed)
[facility] PROGRESS NOTE  Brittany Porter WUJ:[id number] DOB: Oct 15, 1945 DOA: 01/21/2012 PCP: Allean Found, MD  Assessment/Plan: Principal Problem:  *Partial small bowel obstruction Active Problems:  DM2 (diabetes mellitus, type 2)  Hyperlipidemia  1.#1. Partial small bowel obstruction -NG tube out on clear liq Further recommendations per surgery. Repeat KUB shows resolution of the SBO, and she has good Bowel sounds #2. Diabetes mellitus type 2 - on SSI.  #3. History of cancer of the endometrium/breast/ovaries and history of malignant melanoma - per oncology as outpatient   Brief narrative: 66 year-old female with history of diabetes mellitus2, hyperlipidemia, cancer of the endometrium/breast/ovary and history of malignant melanoma presents with complaints of epigastric pain. The pain started out yesterday and today patient had 2-3 episodes of nausea vomiting. In the ER CT shows ventral hernia with possible partial obstruction. Patient does move flatus. At this time patient is already placed on NGT suction and has been admitted for further management.   Consultants:  Surgery    Procedures:  KUB  Antibiotics:  none  HPI/Subjective: Feel better  Objective: Filed Vitals:   01/23/12 1000 01/23/12 1455 01/23/12 1500 01/23/12 1758  BP: 133/46 160/60 177/70 163/65  Pulse: 80 82  91  Temp: 98.1 F (36.7 C) 99.7 F (37.6 C)    TempSrc:      Resp: 18 18  18   Height:      Weight:      SpO2: 95% 99%  94%    Intake/Output Summary (Last 24 hours) at 01/23/12 1815 Last data filed at 01/23/12 1459  Gross per 24 hour  Intake      0 ml  Output    900 ml  Net   -900 ml   Filed Weights   01/22/12 0022  Weight: 102.059 kg (225 lb)    Exam:   General: alert afebrile comfortable,  Cardiovascular: S1S2  Respiratory: CTAB  Abdomen: soft NTND BS+  Data Reviewed: Basic Metabolic Panel:  Lab 01/22/12 9562 01/21/12 1425  NA 141 141  K 3.4* 4.1  CL 104  101  CO2 29 29  GLUCOSE 175* 217*  BUN 14 12  CREATININE 0.54 0.47*  CALCIUM 8.5 10.0  MG -- --  PHOS -- --   Liver Function Tests:  Lab 01/22/12 0630 01/21/12 1620  AST 45* 30  ALT 33 25  ALKPHOS 44 49  BILITOT 0.9 1.0  PROT 6.0 7.0  ALBUMIN 3.2* 3.8    Lab 01/21/12 1620  LIPASE 34  AMYLASE --   No results found for this basename: AMMONIA:5 in the last 168 hours CBC:  Lab 01/22/12 0630 01/21/12 1425  WBC 5.1 9.6  NEUTROABS 3.2 7.5  HGB 12.2 14.7  HCT 37.8 43.3  MCV 89.2 87.7  PLT 145* 184   Cardiac Enzymes: No results found for this basename: CKTOTAL:5,CKMB:5,CKMBINDEX:5,TROPONINI:5 in the last 168 hours BNP (last 3 results) No results found for this basename: PROBNP:3 in the last 8760 hours CBG:  Lab 01/23/12 1756 01/23/12 1156 01/23/12 0802 01/23/12 0427 01/22/12 2025  GLUCAP 125* 107* 128* 129* 118*    No results found for this or any previous visit (from the past 240 hour(s)).   Studies: Ct Abdomen Pelvis W Contrast  01/21/2012  *RADIOLOGY REPORT*  Clinical Data: Mid abdominal pain.  Nausea and vomiting.  History of endometrial cancer.  History of breast cancer.  CT ABDOMEN AND PELVIS WITH CONTRAST  Technique:  Multidetector CT imaging of the abdomen and pelvis was performed following the  standard protocol during bolus administration of intravenous contrast.  Contrast: OMNIPAQUE IOHEXOL 300 MG/ML  SOLN  Comparison: 03/30/2010  Findings: The lung bases demonstrate pleural thickening and pleural calcification on the left with atelectasis and scarring in the left lung base.  Nodular scarring versus rounded atelectasis anteriorly on the left.  Pulmonary nodule is not excluded.  The pleural effusion has resolved since previous study.  Mild atelectasis in the right lung base.  Diffuse low attenuation change throughout the liver consistent with fatty infiltration.  Small esophageal hiatal hernia.  The gallbladder, spleen, pancreas, adrenal glands, kidneys,  abdominal aorta, and retroperitoneal lymph nodes are unremarkable.  The stomach is not abnormally distended.  Stool filled colon without distension.  There is a midline anterior abdominal wall hernia, likely postoperative.  The hernia contains bowel loops.  There is mild dilatation of small bowel proximal and distal to the hernia. There is decompression of the distal ileum.  Changes suggest early or partial small bowel obstruction, likely due to adhesions. Contrast material does pass into the nondistended bowel.  No free air or free fluid in the abdomen.  Pelvis:  Uterus is surgically absent.  No abnormal adnexal masses. No significant pelvic lymphadenopathy.  No free or loculated pelvic fluid collections.  Diverticula in the sigmoid colon without diverticulitis.  The appendix is not identified.  Degenerative changes in the lumbar spine.  IMPRESSION: Ventral abdominal wall hernia, likely incisional.  The hernia contains bowel.  There is mild distension of small bowel before and after the hernia compression of the terminal ileum.  Changes suggest partial obstruction, likely due to adhesions. Diffuse fatty infiltration of the liver. Pleural and parenchymal scarring in the left lung base with nodular scarring versus rounded atelectasis.   Original Report Authenticated By: Marlon Pel, M.D.    Dg Abd 2 Views  01/22/2012  *RADIOLOGY REPORT*  Clinical Data: Follow-up partial small bowel obstruction.  ABDOMEN - 2 VIEW  Comparison: CT abdomen pelvis 01/21/2012  Findings:  There are bilateral axillary surgical clips.  History of bilateral mastectomies.  A nasogastric tube terminates in the distal stomach.  There is cardiomegaly with bibasilar atelectasis, and probable small left pleural effusion.  The stomach is not distended.  Oral contrast is seen within normal caliber colon, from yesterday's CT examination.  Multiple colonic diverticula are demonstrated in the distal colon.  There is also contrast within the  urinary bladder, consistent with excreted contrast from intravenous contrast injection for CT.  There is a small right-sided urinary bladder diverticulum, out aligned with contrast.  There are no dilated small bowel loops.  No contrast is seen within small bowel loops.  No evidence of free intraperitoneal air.  IMPRESSION: 1. Resolution of small bowel dilatation since 01/21/2012.  No evidence of bowel obstruction at this time. Oral contrast is seen within normal caliber colon. 2.  The stomach is decompressed by a nasogastric tube. 3.  Colonic diverticulosis. 4.  Bibasilar atelectasis and/or airspace disease and small left pleural effusion.   Original Report Authenticated By: Britta Mccreedy, M.D.    Dg Abd Portable 2v  01/23/2012  *RADIOLOGY REPORT*  Clinical Data: Small bowel obstruction.  PORTABLE ABDOMEN - 2 VIEW  Comparison: 01/22/2012 and CT abdomen pelvis 01/21/2012.  Findings: Nasogastric tube terminates in the region of the pylorus or duodenal bulb.  There is retained contrast throughout nondilated colon, as before.  No residual or recurrent small bowel dilatation. Calcified splenic artery aneurysms are seen in the left upper quadrant.  IMPRESSION:  1.  No evidence of residual or recurrent small bowel obstruction. 2.  Retained contrast in the colon. 3.  Splenic artery aneurysms.   Original Report Authenticated By: Reyes Ivan, M.D.     Scheduled Meds:    . antiseptic oral rinse  15 mL Mouth Rinse q12n4p  . chlorhexidine  15 mL Mouth Rinse BID  . insulin aspart  0-9 Units Subcutaneous TID WC   Continuous Infusions:    . sodium chloride 100 mL/hr at 01/22/12 1839    Principal Problem:  *Partial small bowel obstruction Active Problems:  DM2 (diabetes mellitus, type 2)  Hyperlipidemia        Brittany Porter  [facility] Pager [phone] If 8PM-8AM, please contact night-coverage at [email or web address], password [facility] 01/23/2012, 6:15 PM  LOS: 2 days

## 2012-01-23 NOTE — Progress Notes (Signed)
Subjective: Patient states she has no pain this morning other than irritation in her throat from NGT. She has been passing gas. Tolerating ice chips with no nausea.  Objective: Vital signs in last 24 hours: Temp:  [98.2 F (36.8 C)-99.5 F (37.5 C)] 99.5 F (37.5 C) (09/09 0515) Pulse Rate:  [64-84] 84  (09/09 0515) Resp:  [15-19] 15  (09/09 0515) BP: (127-159)/(41-81) 159/57 mmHg (09/09 0515) SpO2:  [92 %-98 %] 92 % (09/09 0515) Last BM Date: 01/21/12  Intake/Output from previous day: 09/08 0701 - 09/09 0700 In: 515 [I.V.:515] Out: 1700 [Urine:800; Emesis/NG output:900] Intake/Output this shift:   Gen: Awake, alert. Able to walk from chair to bed with no difficulty Heart: Tachy, no murmur Abd: +BS, soft, nontender, nondistended  Lab Results:   Basename 01/22/12 0630 01/21/12 1425  WBC 5.1 9.6  HGB 12.2 14.7  HCT 37.8 43.3  PLT 145* 184   BMET  Basename 01/22/12 0630 01/21/12 1425  NA 141 141  K 3.4* 4.1  CL 104 101  CO2 29 29  GLUCOSE 175* 217*  BUN 14 12  CREATININE 0.54 0.47*  CALCIUM 8.5 10.0   Studies/Results: Ct Abdomen Pelvis W Contrast  01/21/2012  *RADIOLOGY REPORT*  Clinical Data: Mid abdominal pain.  Nausea and vomiting.  History of endometrial cancer.  History of breast cancer.  CT ABDOMEN AND PELVIS WITH CONTRAST  Technique:  Multidetector CT imaging of the abdomen and pelvis was performed following the standard protocol during bolus administration of intravenous contrast.  Contrast: OMNIPAQUE IOHEXOL 300 MG/ML  SOLN  Comparison: 03/30/2010  Findings: The lung bases demonstrate pleural thickening and pleural calcification on the left with atelectasis and scarring in the left lung base.  Nodular scarring versus rounded atelectasis anteriorly on the left.  Pulmonary nodule is not excluded.  The pleural effusion has resolved since previous study.  Mild atelectasis in the right lung base.  Diffuse low attenuation change throughout the liver consistent  with fatty infiltration.  Small esophageal hiatal hernia.  The gallbladder, spleen, pancreas, adrenal glands, kidneys, abdominal aorta, and retroperitoneal lymph nodes are unremarkable.  The stomach is not abnormally distended.  Stool filled colon without distension.  There is a midline anterior abdominal wall hernia, likely postoperative.  The hernia contains bowel loops.  There is mild dilatation of small bowel proximal and distal to the hernia. There is decompression of the distal ileum.  Changes suggest early or partial small bowel obstruction, likely due to adhesions. Contrast material does pass into the nondistended bowel.  No free air or free fluid in the abdomen.  Pelvis:  Uterus is surgically absent.  No abnormal adnexal masses. No significant pelvic lymphadenopathy.  No free or loculated pelvic fluid collections.  Diverticula in the sigmoid colon without diverticulitis.  The appendix is not identified.  Degenerative changes in the lumbar spine.  IMPRESSION: Ventral abdominal wall hernia, likely incisional.  The hernia contains bowel.  There is mild distension of small bowel before and after the hernia compression of the terminal ileum.  Changes suggest partial obstruction, likely due to adhesions. Diffuse fatty infiltration of the liver. Pleural and parenchymal scarring in the left lung base with nodular scarring versus rounded atelectasis.   Original Report Authenticated By: Marlon Pel, M.D.    Dg Abd 2 Views  01/22/2012  *RADIOLOGY REPORT*  Clinical Data: Follow-up partial small bowel obstruction.  ABDOMEN - 2 VIEW  Comparison: CT abdomen pelvis 01/21/2012  Findings:  There are bilateral axillary surgical clips.  History of bilateral mastectomies.  A nasogastric tube terminates in the distal stomach.  There is cardiomegaly with bibasilar atelectasis, and probable small left pleural effusion.  The stomach is not distended.  Oral contrast is seen within normal caliber colon, from yesterday's CT  examination.  Multiple colonic diverticula are demonstrated in the distal colon.  There is also contrast within the urinary bladder, consistent with excreted contrast from intravenous contrast injection for CT.  There is a small right-sided urinary bladder diverticulum, out aligned with contrast.  There are no dilated small bowel loops.  No contrast is seen within small bowel loops.  No evidence of free intraperitoneal air.  IMPRESSION: 1. Resolution of small bowel dilatation since 01/21/2012.  No evidence of bowel obstruction at this time. Oral contrast is seen within normal caliber colon. 2.  The stomach is decompressed by a nasogastric tube. 3.  Colonic diverticulosis. 4.  Bibasilar atelectasis and/or airspace disease and small left pleural effusion.   Original Report Authenticated By: Britta Mccreedy, M.D.    Dg Abd Acute W/chest  01/21/2012  *RADIOLOGY REPORT*  Clinical Data: Nausea and vomiting.  ACUTE ABDOMEN SERIES (ABDOMEN 2 VIEW & CHEST 1 VIEW)  Comparison: CT scan 03/30/2010.  Findings: The upright chest x-ray demonstrates a surgical changes from bilateral mastectomies.  There is a left pleural effusion and left basilar scarring changes.  Two views of the abdomen demonstrate an unremarkable/nonspecific bowel gas pattern.  There is scattered air and stool in the colon and scattered small bowel loops with air but no distention.  The soft tissue shadows are maintained.  No free air is identified. 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  IMPRESSION:  1.  Left pleural effusion and left basilar scarring. 2.  No plain film findings for an acute abdominal process.   Original Report Authenticated By: P. Loralie Champagne, M.D.    Assessment/Plan: 66 yo F with partial small bowel obstruction - Recheck abd 2 view film today - She is tolerating ice chips and has no nausea or pain at this time - Will consider NGT clamping trials today and slowly advance diet to clears if tolerates. Will discuss with  surgery team prior to placing this order. - Will discuss with Dr. Lindie Spruce who will see patient today as well.   LOS: 2 days   [employer]. Aybree Lanyon, M.D. 01/23/2012

## 2012-01-23 NOTE — Progress Notes (Signed)
Subjective: Would like to get the NG out.  OK after clamping.  Walking some with family. + flatus Objective: Vital signs in last 24 hours: Temp:  [98.1 F (36.7 C)-99.7 F (37.6 C)] 99.7 F (37.6 C) (09/09 1455) Pulse Rate:  [64-84] 82  (09/09 1455) Resp:  [15-19] 18  (09/09 1455) BP: (127-177)/(41-70) 177/70 mmHg (09/09 1500) SpO2:  [92 %-99 %] 99 % (09/09 1455) Last BM Date: 01/21/12  Intake/Output from previous day: 09/08 0701 - 09/09 0700 In: 515 [I.V.:515] Out: 1700 [Urine:800; Emesis/NG output:900] Intake/Output this shift:    General appearance: alert, cooperative and no distress GI: few BS, not tender, + flatus.  Lab Results:   Basename 01/22/12 0630 01/21/12 1425  WBC 5.1 9.6  HGB 12.2 14.7  HCT 37.8 43.3  PLT 145* 184    BMET  Basename 01/22/12 0630 01/21/12 1425  NA 141 141  K 3.4* 4.1  CL 104 101  CO2 29 29  GLUCOSE 175* 217*  BUN 14 12  CREATININE 0.54 0.47*  CALCIUM 8.5 10.0   PT/INR No results found for this basename: LABPROT:2,INR:2 in the last 72 hours   Lab 01/22/12 0630 01/21/12 1620  AST 45* 30  ALT 33 25  ALKPHOS 44 49  BILITOT 0.9 1.0  PROT 6.0 7.0  ALBUMIN 3.2* 3.8     Lipase     Component Value Date/Time   LIPASE 34 01/21/2012 1620     Studies/Results: Ct Abdomen Pelvis W Contrast  01/21/2012  *RADIOLOGY REPORT*  Clinical Data: Mid abdominal pain.  Nausea and vomiting.  History of endometrial cancer.  History of breast cancer.  CT ABDOMEN AND PELVIS WITH CONTRAST  Technique:  Multidetector CT imaging of the abdomen and pelvis was performed following the standard protocol during bolus administration of intravenous contrast.  Contrast: OMNIPAQUE IOHEXOL 300 MG/ML  SOLN  Comparison: 03/30/2010  Findings: The lung bases demonstrate pleural thickening and pleural calcification on the left with atelectasis and scarring in the left lung base.  Nodular scarring versus rounded atelectasis anteriorly on the left.  Pulmonary nodule  is not excluded.  The pleural effusion has resolved since previous study.  Mild atelectasis in the right lung base.  Diffuse low attenuation change throughout the liver consistent with fatty infiltration.  Small esophageal hiatal hernia.  The gallbladder, spleen, pancreas, adrenal glands, kidneys, abdominal aorta, and retroperitoneal lymph nodes are unremarkable.  The stomach is not abnormally distended.  Stool filled colon without distension.  There is a midline anterior abdominal wall hernia, likely postoperative.  The hernia contains bowel loops.  There is mild dilatation of small bowel proximal and distal to the hernia. There is decompression of the distal ileum.  Changes suggest early or partial small bowel obstruction, likely due to adhesions. Contrast material does pass into the nondistended bowel.  No free air or free fluid in the abdomen.  Pelvis:  Uterus is surgically absent.  No abnormal adnexal masses. No significant pelvic lymphadenopathy.  No free or loculated pelvic fluid collections.  Diverticula in the sigmoid colon without diverticulitis.  The appendix is not identified.  Degenerative changes in the lumbar spine.  IMPRESSION: Ventral abdominal wall hernia, likely incisional.  The hernia contains bowel.  There is mild distension of small bowel before and after the hernia compression of the terminal ileum.  Changes suggest partial obstruction, likely due to adhesions. Diffuse fatty infiltration of the liver. Pleural and parenchymal scarring in the left lung base with nodular scarring versus rounded atelectasis.  Original Report Authenticated By: Marlon Pel, M.D.    Dg Abd 2 Views  01/22/2012  *RADIOLOGY REPORT*  Clinical Data: Follow-up partial small bowel obstruction.  ABDOMEN - 2 VIEW  Comparison: CT abdomen pelvis 01/21/2012  Findings:  There are bilateral axillary surgical clips.  History of bilateral mastectomies.  A nasogastric tube terminates in the distal stomach.  There is  cardiomegaly with bibasilar atelectasis, and probable small left pleural effusion.  The stomach is not distended.  Oral contrast is seen within normal caliber colon, from yesterday's CT examination.  Multiple colonic diverticula are demonstrated in the distal colon.  There is also contrast within the urinary bladder, consistent with excreted contrast from intravenous contrast injection for CT.  There is a small right-sided urinary bladder diverticulum, out aligned with contrast.  There are no dilated small bowel loops.  No contrast is seen within small bowel loops.  No evidence of free intraperitoneal air.  IMPRESSION: 1. Resolution of small bowel dilatation since 01/21/2012.  No evidence of bowel obstruction at this time. Oral contrast is seen within normal caliber colon. 2.  The stomach is decompressed by a nasogastric tube. 3.  Colonic diverticulosis. 4.  Bibasilar atelectasis and/or airspace disease and small left pleural effusion.   Original Report Authenticated By: Britta Mccreedy, M.D.    Dg Abd Portable 2v  01/23/2012  *RADIOLOGY REPORT*  Clinical Data: Small bowel obstruction.  PORTABLE ABDOMEN - 2 VIEW  Comparison: 01/22/2012 and CT abdomen pelvis 01/21/2012.  Findings: Nasogastric tube terminates in the region of the pylorus or duodenal bulb.  There is retained contrast throughout nondilated colon, as before.  No residual or recurrent small bowel dilatation. Calcified splenic artery aneurysms are seen in the left upper quadrant.  IMPRESSION:  1.  No evidence of residual or recurrent small bowel obstruction. 2.  Retained contrast in the colon. 3.  Splenic artery aneurysms.   Original Report Authenticated By: Reyes Ivan, M.D.     Medications:    . antiseptic oral rinse  15 mL Mouth Rinse q12n4p  . chlorhexidine  15 mL Mouth Rinse BID  . 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 Ovarian cancer  . Uterine carcinoma  . Melanoma of upper back excluding scapular region  . Partial small bowel obstruction  . DM2 (diabetes mellitus, type 2)  . Hyperlipidemia   Plan:  NG out, start some clears, mobilize.  Labs in AM   LOS: 2 days    Ellery Tash 01/23/2012

## 2012-01-24 DIAGNOSIS — K56609 Unspecified intestinal obstruction, unspecified as to partial versus complete obstruction: Secondary | ICD-10-CM | POA: Diagnosis not present

## 2012-01-24 DIAGNOSIS — E119 Type 2 diabetes mellitus without complications: Secondary | ICD-10-CM | POA: Diagnosis not present

## 2012-01-24 DIAGNOSIS — E785 Hyperlipidemia, unspecified: Secondary | ICD-10-CM | POA: Diagnosis not present

## 2012-01-24 LAB — CBC
HCT: 39.5 % (ref 36.0–46.0)
Hemoglobin: 13.2 g/dL (ref 12.0–15.0)
MCH: 29.5 pg (ref 26.0–34.0)
MCHC: 33.4 g/dL (ref 30.0–36.0)
MCV: 88.4 fL (ref 78.0–100.0)
Platelets: 140 10*3/uL — ABNORMAL LOW (ref 150–400)
RBC: 4.47 MIL/uL (ref 3.87–5.11)
RDW: 13.1 % (ref 11.5–15.5)
WBC: 6.9 10*3/uL (ref 4.0–10.5)

## 2012-01-24 LAB — BASIC METABOLIC PANEL
BUN: 6 mg/dL (ref 6–23)
CO2: 29 mEq/L (ref 19–32)
Calcium: 9.2 mg/dL (ref 8.4–10.5)
Chloride: 102 mEq/L (ref 96–112)
Creatinine, Ser: 0.53 mg/dL (ref 0.50–1.10)
GFR calc Af Amer: >90 mL/min (ref >90)
GFR calc non Af Amer: >90 mL/min (ref >90)
Glucose, Bld: 201 mg/dL — ABNORMAL HIGH (ref 70–99)
Potassium: 4.1 mEq/L (ref 3.5–5.1)
Sodium: 142 mEq/L (ref 135–145)

## 2012-01-24 LAB — GLUCOSE, CAPILLARY
Glucose-Capillary: 144 mg/dL — ABNORMAL HIGH (ref 70–99)
Glucose-Capillary: 149 mg/dL — ABNORMAL HIGH (ref 70–99)
Glucose-Capillary: 110 mg/dL — ABNORMAL HIGH (ref 70–99)
Glucose-Capillary: 200 mg/dL — ABNORMAL HIGH (ref 70–99)
Glucose-Capillary: 247 mg/dL — ABNORMAL HIGH (ref 70–99)
Glucose-Capillary: 167 mg/dL — ABNORMAL HIGH (ref 70–99)

## 2012-01-24 LAB — MAGNESIUM: Magnesium: 1.5 mg/dL (ref 1.5–2.5)

## 2012-01-24 NOTE — Progress Notes (Signed)
[facility] Progress Note    Subjective: No new issues.  Passing flatus.  No BM  Objective: Vital signs in last 24 hours: Temp:  [98.1 F (36.7 C)-99.7 F (37.6 C)] 99.2 F (37.3 C) (09/10 0541) Pulse Rate:  [76-91] 77  (09/10 0541) Resp:  [18] 18  (09/10 0541) BP: (133-177)/(46-70) 146/57 mmHg (09/10 0541) SpO2:  [94 %-99 %] 98 % (09/10 0541) Weight:  [105.779 kg (233 lb 3.2 oz)] 105.779 kg (233 lb 3.2 oz) (09/10 0541) Last BM Date: 01/21/12  Intake/Output from previous day: 09/09 0701 - 09/10 0700 In: 360 [P.O.:360] Out: 200 [Emesis/NG output:200] Intake/Output this shift:    General: No acute distress  Lungs: Clear  Abd: Soft, nontender.  Extremities: No change  Neuro: Intact  Lab Results:  @LABLAST2 (wbc:2,hgb:2,hct:2,plt:2) BMET  Basename 01/24/12 0605 02-05-2012 0630  NA 142 141  K 4.1 3.4*  CL 102 104  CO2 29 29  GLUCOSE 201* 175*  BUN 6 14  CREATININE 0.53 0.54  CALCIUM 9.2 8.5   PT/INR No results found for this basename: LABPROT:2,INR:2 in the last 72 hours ABG No results found for this basename: PHART:2,PCO2:2,PO2:2,HCO3:2 in the last 72 hours  Studies/Results: Dg Abd 2 Views  2012-02-05  *RADIOLOGY REPORT*  Clinical Data: Follow-up partial small bowel obstruction.  ABDOMEN - 2 VIEW  Comparison: CT abdomen pelvis 01/21/2012  Findings:  There are bilateral axillary surgical clips.  History of bilateral mastectomies.  A nasogastric tube terminates in the distal stomach.  There is cardiomegaly with bibasilar atelectasis, and probable small left pleural effusion.  The stomach is not distended.  Oral contrast is seen within normal caliber colon, from yesterday's CT examination.  Multiple colonic diverticula are demonstrated in the distal colon.  There is also contrast within the urinary bladder, consistent with excreted contrast from intravenous contrast injection for CT.  There is a small right-sided urinary bladder diverticulum, out aligned with contrast.   There are no dilated small bowel loops.  No contrast is seen within small bowel loops.  No evidence of free intraperitoneal air.  IMPRESSION: 1. Resolution of small bowel dilatation since 01/21/2012.  No evidence of bowel obstruction at this time. Oral contrast is seen within normal caliber colon. 2.  The stomach is decompressed by a nasogastric tube. 3.  Colonic diverticulosis. 4.  Bibasilar atelectasis and/or airspace disease and small left pleural effusion.   Original Report Authenticated By: Britta Mccreedy, M.D.    Dg Abd Portable 2v  01/23/2012  *RADIOLOGY REPORT*  Clinical Data: Small bowel obstruction.  PORTABLE ABDOMEN - 2 VIEW  Comparison: 05-Feb-2012 and CT abdomen pelvis 01/21/2012.  Findings: Nasogastric tube terminates in the region of the pylorus or duodenal bulb.  There is retained contrast throughout nondilated colon, as before.  No residual or recurrent small bowel dilatation. Calcified splenic artery aneurysms are seen in the left upper quadrant.  IMPRESSION:  1.  No evidence of residual or recurrent small bowel obstruction. 2.  Retained contrast in the colon. 3.  Splenic artery aneurysms.   Original Report Authenticated By: Reyes Ivan, M.D.     Anti-infectives: Anti-infectives    None      Assessment/Plan: s/p  Advance diet Goes to full liquids  LOS: 3 days   Marta Lamas. Gae Bon, MD, FACS [phone] [facility] 01/24/2012

## 2012-01-24 NOTE — Progress Notes (Signed)
[facility] PROGRESS NOTE  Brittany Porter ZOX:[id number] DOB: 1946-01-19 DOA: 01/21/2012 PCP: Allean Found, MD  Assessment/Plan: Principal Problem:  *Partial small bowel obstruction Active Problems:  DM2 (diabetes mellitus, type 2)  Hyperlipidemia  1.#1. Partial small bowel obstruction -NG tube out , advance diet as tolerated .  Further recommendations per surgery. Repeat KUB shows resolution of the SBO, and she has good Bowel sounds #2. Diabetes mellitus type 2 - on SSI.  #3. History of cancer of the endometrium/breast/ovaries and history of malignant melanoma - per oncology as outpatient   Brief narrative: 66 year-old female with history of diabetes mellitus2, hyperlipidemia, cancer of the endometrium/breast/ovary and history of malignant melanoma presents with complaints of epigastric pain. The pain started out yesterday and today patient had 2-3 episodes of nausea vomiting. In the ER CT shows ventral hernia with possible partial obstruction. Patient does move flatus. At this time patient is already placed on NGT suction and has been admitted for further management.   Consultants:  Surgery    Procedures:  KUB  Antibiotics:  none  HPI/Subjective: Feel better  Objective: Filed Vitals:   01/23/12 1758 01/23/12 2150 01/24/12 0541 01/24/12 1541  BP: 163/65 135/63 146/57 135/63  Pulse: 91 76 77 80  Temp:  98.1 F (36.7 C) 99.2 F (37.3 C) 98.2 F (36.8 C)  TempSrc:    Oral  Resp: 18 18 18 18   Height:      Weight:   105.779 kg (233 lb 3.2 oz)   SpO2: 94% 97% 98% 97%    Intake/Output Summary (Last 24 hours) at 01/24/12 1925 Last data filed at 01/24/12 1500  Gross per 24 hour  Intake   1120 ml  Output      0 ml  Net   1120 ml   Filed Weights   01/22/12 0022 01/24/12 0541  Weight: 102.059 kg (225 lb) 105.779 kg (233 lb 3.2 oz)    Exam:   General: alert afebrile comfortable,  Cardiovascular: S1S2  Respiratory: CTAB  Abdomen: soft NTND  BS+  Data Reviewed: Basic Metabolic Panel:  Lab 01/24/12 8119 01/22/12 0630 01/21/12 1425  NA 142 141 141  K 4.1 3.4* 4.1  CL 102 104 101  CO2 29 29 29   GLUCOSE 201* 175* 217*  BUN 6 14 12   CREATININE 0.53 0.54 0.47*  CALCIUM 9.2 8.5 10.0  MG 1.5 -- --  PHOS -- -- --   Liver Function Tests:  Lab 01/22/12 0630 01/21/12 1620  AST 45* 30  ALT 33 25  ALKPHOS 44 49  BILITOT 0.9 1.0  PROT 6.0 7.0  ALBUMIN 3.2* 3.8    Lab 01/21/12 1620  LIPASE 34  AMYLASE --   No results found for this basename: AMMONIA:5 in the last 168 hours CBC:  Lab 01/24/12 0605 01/22/12 0630 01/21/12 1425  WBC 6.9 5.1 9.6  NEUTROABS -- 3.2 7.5  HGB 13.2 12.2 14.7  HCT 39.5 37.8 43.3  MCV 88.4 89.2 87.7  PLT 140* 145* 184   Cardiac Enzymes: No results found for this basename: CKTOTAL:5,CKMB:5,CKMBINDEX:5,TROPONINI:5 in the last 168 hours BNP (last 3 results) No results found for this basename: PROBNP:3 in the last 8760 hours CBG:  Lab 01/24/12 1807 01/24/12 1234 01/24/12 0828 01/24/12 0334 01/23/12 2336  GLUCAP 200* 110* 149* 144* 137*    No results found for this or any previous visit (from the past 240 hour(s)).   Studies: Dg Abd Portable 2v  01/23/2012  *RADIOLOGY REPORT*  Clinical Data: Small  bowel obstruction.  PORTABLE ABDOMEN - 2 VIEW  Comparison: 01/22/2012 and CT abdomen pelvis 01/21/2012.  Findings: Nasogastric tube terminates in the region of the pylorus or duodenal bulb.  There is retained contrast throughout nondilated colon, as before.  No residual or recurrent small bowel dilatation. Calcified splenic artery aneurysms are seen in the left upper quadrant.  IMPRESSION:  1.  No evidence of residual or recurrent small bowel obstruction. 2.  Retained contrast in the colon. 3.  Splenic artery aneurysms.   Original Report Authenticated By: Reyes Ivan, M.D.     Scheduled Meds:    . insulin aspart  0-9 Units Subcutaneous TID WC  . DISCONTD: antiseptic oral rinse  15 mL Mouth  Rinse q12n4p  . DISCONTD: chlorhexidine  15 mL Mouth Rinse BID   Continuous Infusions:    Principal Problem:  *Partial small bowel obstruction Active Problems:  DM2 (diabetes mellitus, type 2)  Hyperlipidemia        Brittany Porter  [facility] Pager [phone] If 8PM-8AM, please contact night-coverage at [email or web address], password [facility] 01/24/2012, 7:25 PM  LOS: 3 days

## 2012-01-25 DIAGNOSIS — E785 Hyperlipidemia, unspecified: Secondary | ICD-10-CM | POA: Diagnosis not present

## 2012-01-25 DIAGNOSIS — C569 Malignant neoplasm of unspecified ovary: Secondary | ICD-10-CM | POA: Diagnosis not present

## 2012-01-25 DIAGNOSIS — E119 Type 2 diabetes mellitus without complications: Secondary | ICD-10-CM | POA: Diagnosis not present

## 2012-01-25 DIAGNOSIS — K56609 Unspecified intestinal obstruction, unspecified as to partial versus complete obstruction: Secondary | ICD-10-CM | POA: Diagnosis not present

## 2012-01-25 LAB — GLUCOSE, CAPILLARY
Glucose-Capillary: 168 mg/dL — ABNORMAL HIGH (ref 70–99)
Glucose-Capillary: 227 mg/dL — ABNORMAL HIGH (ref 70–99)
Glucose-Capillary: 157 mg/dL — ABNORMAL HIGH (ref 70–99)

## 2012-01-25 MED ORDER — DOCUSATE SODIUM 100 MG PO CAPS: 100.0000 mg | ORAL_CAPSULE | Freq: Two times a day (BID) | ORAL | Status: AC

## 2012-01-25 NOTE — Progress Notes (Signed)
Discharge instructions gone over with patient. Prescription given. Home medications reviewed. Follow up appointment to be made. Diet and activity discussed. Signs and symptoms of worsening condition gone over. Patient stated understanding of instructions. No questions at this time.

## 2012-01-25 NOTE — Progress Notes (Signed)
Okay to go home.  Cellie Dardis O. Jamarien Rodkey, III, MD, FACS [phone]--pager [phone]--office [facility]  

## 2012-01-25 NOTE — Progress Notes (Signed)
Subjective: Pt feeling well. Tolerating diet, +flatus. No pain or concerns  Objective: Vital signs in last 24 hours: Temp:  [98 F (36.7 C)-98.6 F (37 C)] 98.6 F (37 C) (09/11 0539) Pulse Rate:  [72-87] 72  (09/11 0539) Resp:  [18] 18  (09/11 0539) BP: (135-140)/(57-78) 140/78 mmHg (09/11 0539) SpO2:  [97 %-98 %] 97 % (09/11 0539) Weight:  [235 lb 11.2 oz (106.913 kg)] 235 lb 11.2 oz (106.913 kg) (09/11 0539) Last BM Date: 01/20/12  Intake/Output from previous day: 09/10 0701 - 09/11 0700 In: 1120 [P.O.:1120] Out: -   General: No acute distress, sitting up in chair Abd: Soft, nontender, +BS Neuro: Grossly intact  Lab Results:  BMET  Basename 01/24/12 0605  NA 142  K 4.1  CL 102  CO2 29  GLUCOSE 201*  BUN 6  CREATININE 0.53  CALCIUM 9.2   Anti-infectives: Anti-infectives    None     Assessment/Plan: 66 yo F with SBO s/p NGT, improved  - SBO improved. Continue regular diet - Ok for D/C from surgical standpoint. Will need follow up with PCP. Can electively follow up with [facility] for hernia repair but this is non-urgent - Will discuss with Dr. Lindie Spruce   LOS: 4 days   Modesto Ganoe M. Deziree Mokry, M.D.  01/25/2012

## 2012-01-25 NOTE — Discharge Summary (Signed)
Physician Discharge Summary  Brittany Porter RUE:[id number] DOB: September 08, 1945 DOA: 01/21/2012  PCP: Allean Found, MD  Admit date: 01/21/2012 Discharge date: 01/25/2012   Discharge Diagnoses:  Principal Problem:  *Partial small bowel obstruction Active Problems:  DM2 (diabetes mellitus, type 2)  Hyperlipidemia   Discharge Condition: good, tolerating regular diet   Diet recommendation: regular diet  Filed Weights   01/22/12 0022 01/24/12 0541 01/25/12 0539  Weight: 102.059 kg (225 lb) 105.779 kg (233 lb 3.2 oz) 106.913 kg (235 lb 11.2 oz)    History of present illness:  66 year-old female with history of diabetes mellitus2, hyperlipidemia, cancer of the endometrium/breast/ovary and history of malignant melanoma presents with complaints of epigastric pain. The pain started out yesterday and today patient had 2-3 episodes of nausea vomiting. In the ER CT shows ventral hernia with possible partial obstruction. Patient does move flatus. At this time patient is already placed on NGT suction and has been admitted for further management. Patient denies any chest pain shortness of breath.    Hospital Course:  1. PSBO - patient had NG tube placed on admission and she was kept NPO. Surgical consultation was provided by [facility]. Etiology of PSBO may be due to incisional hernia but Dr. Dwain Sarna noted in his consult note that he was unable to find the hernia on exam. Patient improved and the NG was removed and diet was advanced. She did not have recurrence of her symptoms.   Procedures:  CT abdomen and pelvis  Consultations:  [facility]  Discharge Exam: Filed Vitals:   01/24/12 0541 01/24/12 1541 01/24/12 2111 01/25/12 0539  BP: 146/57 135/63 140/57 140/78  Pulse: 77 80 87 72  Temp: 99.2 F (37.3 C) 98.2 F (36.8 C) 98 F (36.7 C) 98.6 F (37 C)  TempSrc:  Oral Oral Oral  Resp: 18 18 18 18   Height:      Weight: 105.779 kg (233 lb 3.2 oz)   106.913 kg (235 lb 11.2 oz)  SpO2: 98% 97% 98% 97%      General: axox3 Cardiovascular: RRR Respiratory: CTAB Abdomen : soft, NT  Discharge Instructions  Discharge Orders    Future Appointments: Provider: Department: Dept Phone: Center:   07/12/2012 10:00 AM Windell Hummingbird [facility] [phone] None   07/12/2012 10:30 AM Victorino December, MD [facility] [phone] None   12/04/2012 11:30 AM Billie Lade, MD [facility] [phone] None     Future Orders Please Complete By Expires   Diet - low sodium heart healthy      Increase activity slowly          Medication List     As of 01/25/2012 12:12 PM    TAKE these medications         Calcium 600+D 600-400 MG-UNIT per tablet   Generic drug: Calcium Carbonate-Vitamin D   Take 2 tablets by mouth daily.      docusate sodium 100 MG capsule   Commonly known as: COLACE   Take 1 capsule (100 mg total) by mouth 2 (two) times daily.      ferrous sulfate 325 (65 FE) MG tablet   Take 325 mg by mouth daily with breakfast.      fish oil-omega-3 fatty acids 1000 MG capsule   Take 2 g by mouth daily.      furosemide 20 MG tablet   Commonly known as: LASIX   Take 20 mg by mouth daily.      letrozole 2.5 MG tablet  Commonly known as: FEMARA   Take 2.5 mg by mouth daily.      meloxicam 7.5 MG tablet   Commonly known as: MOBIC   Take 7.5 mg by mouth 2 (two) times daily.      metFORMIN 500 MG tablet   Commonly known as: GLUCOPHAGE   Take 500 mg by mouth 2 (two) times daily with a meal.      prochlorperazine 10 MG tablet   Commonly known as: COMPAZINE   Take 10 mg by mouth as needed. For nausea      simvastatin 40 MG tablet   Commonly known as: ZOCOR   Take 40 mg by mouth at bedtime.      Vitamin D3 2000 UNITS Tabs   Take 2,000 mg by mouth daily.           Follow-up Information    Schedule an appointment as soon as possible for a visit with Allean Found, MD.   Contact information:   [address] [place]  [address] [phone]           The results of significant diagnostics from this hospitalization (including imaging, microbiology, ancillary and laboratory) are listed below for reference.    Significant Diagnostic Studies: Ct Abdomen Pelvis W Contrast  01/21/2012  *RADIOLOGY REPORT*  Clinical Data: Mid abdominal pain.  Nausea and vomiting.  History of endometrial cancer.  History of breast cancer.  CT ABDOMEN AND PELVIS WITH CONTRAST  Technique:  Multidetector CT imaging of the abdomen and pelvis was performed following the standard protocol during bolus administration of intravenous contrast.  Contrast: OMNIPAQUE IOHEXOL 300 MG/ML  SOLN  Comparison: 03/30/2010  Findings: The lung bases demonstrate pleural thickening and pleural calcification on the left with atelectasis and scarring in the left lung base.  Nodular scarring versus rounded atelectasis anteriorly on the left.  Pulmonary nodule is not excluded.  The pleural effusion has resolved since previous study.  Mild atelectasis in the right lung base.  Diffuse low attenuation change throughout the liver consistent with fatty infiltration.  Small esophageal hiatal hernia.  The gallbladder, spleen, pancreas, adrenal glands, kidneys, abdominal aorta, and retroperitoneal lymph nodes are unremarkable.  The stomach is not abnormally distended.  Stool filled colon without distension.  There is a midline anterior abdominal wall hernia, likely postoperative.  The hernia contains bowel loops.  There is mild dilatation of small bowel proximal and distal to the hernia. There is decompression of the distal ileum.  Changes suggest early or partial small bowel obstruction, likely due to adhesions. Contrast material does pass into the nondistended bowel.  No free air or free fluid in the abdomen.  Pelvis:  Uterus is surgically absent.  No abnormal adnexal masses. No significant pelvic lymphadenopathy.  No free or loculated pelvic fluid collections.  Diverticula  in the sigmoid colon without diverticulitis.  The appendix is not identified.  Degenerative changes in the lumbar spine.  IMPRESSION: Ventral abdominal wall hernia, likely incisional.  The hernia contains bowel.  There is mild distension of small bowel before and after the hernia compression of the terminal ileum.  Changes suggest partial obstruction, likely due to adhesions. Diffuse fatty infiltration of the liver. Pleural and parenchymal scarring in the left lung base with nodular scarring versus rounded atelectasis.   Original Report Authenticated By: Marlon Pel, M.D.    Dg Abd 2 Views  01/22/2012  *RADIOLOGY REPORT*  Clinical Data: Follow-up partial small bowel obstruction.  ABDOMEN - 2 VIEW  Comparison: CT abdomen pelvis 01/21/2012  Findings:  There are bilateral axillary surgical clips.  History of bilateral mastectomies.  A nasogastric tube terminates in the distal stomach.  There is cardiomegaly with bibasilar atelectasis, and probable small left pleural effusion.  The stomach is not distended.  Oral contrast is seen within normal caliber colon, from yesterday's CT examination.  Multiple colonic diverticula are demonstrated in the distal colon.  There is also contrast within the urinary bladder, consistent with excreted contrast from intravenous contrast injection for CT.  There is a small right-sided urinary bladder diverticulum, out aligned with contrast.  There are no dilated small bowel loops.  No contrast is seen within small bowel loops.  No evidence of free intraperitoneal air.  IMPRESSION: 1. Resolution of small bowel dilatation since 01/21/2012.  No evidence of bowel obstruction at this time. Oral contrast is seen within normal caliber colon. 2.  The stomach is decompressed by a nasogastric tube. 3.  Colonic diverticulosis. 4.  Bibasilar atelectasis and/or airspace disease and small left pleural effusion.   Original Report Authenticated By: Britta Mccreedy, M.D.    Dg Abd Acute  W/chest  01/21/2012  *RADIOLOGY REPORT*  Clinical Data: Nausea and vomiting.  ACUTE ABDOMEN SERIES (ABDOMEN 2 VIEW & CHEST 1 VIEW)  Comparison: CT scan 03/30/2010.  Findings: The upright chest x-ray demonstrates a surgical changes from bilateral mastectomies.  There is a left pleural effusion and left basilar scarring changes.  Two views of the abdomen demonstrate an unremarkable/nonspecific bowel gas pattern.  There is scattered air and stool in the colon and scattered small bowel loops with air but no distention.  The soft tissue shadows are maintained.  No free air is identified. 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  IMPRESSION:  1.  Left pleural effusion and left basilar scarring. 2.  No plain film findings for an acute abdominal process.   Original Report Authenticated By: P. Loralie Champagne, M.D.    Dg Abd Portable 2v  01/23/2012  *RADIOLOGY REPORT*  Clinical Data: Small bowel obstruction.  PORTABLE ABDOMEN - 2 VIEW  Comparison: 01/22/2012 and CT abdomen pelvis 01/21/2012.  Findings: Nasogastric tube terminates in the region of the pylorus or duodenal bulb.  There is retained contrast throughout nondilated colon, as before.  No residual or recurrent small bowel dilatation. Calcified splenic artery aneurysms are seen in the left upper quadrant.  IMPRESSION:  1.  No evidence of residual or recurrent small bowel obstruction. 2.  Retained contrast in the colon. 3.  Splenic artery aneurysms.   Original Report Authenticated By: Reyes Ivan, M.D.     Microbiology: No results found for this or any previous visit (from the past 240 hour(s)).   Labs: Basic Metabolic Panel:  Lab 01/24/12 5409 01/22/12 0630 01/21/12 1425  NA 142 141 141  K 4.1 3.4* 4.1  CL 102 104 101  CO2 29 29 29   GLUCOSE 201* 175* 217*  BUN 6 14 12   CREATININE 0.53 0.54 0.47*  CALCIUM 9.2 8.5 10.0  MG 1.5 -- --  PHOS -- -- --   Liver Function Tests:  Lab 01/22/12 0630 01/21/12 1620  AST 45* 30  ALT 33 25   ALKPHOS 44 49  BILITOT 0.9 1.0  PROT 6.0 7.0  ALBUMIN 3.2* 3.8    Lab 01/21/12 1620  LIPASE 34  AMYLASE --   No results found for this basename: AMMONIA:5 in the last 168 hours CBC:  Lab 01/24/12 0605 01/22/12 0630 01/21/12 1425  WBC  6.9 5.1 9.6  NEUTROABS -- 3.2 7.5  HGB 13.2 12.2 14.7  HCT 39.5 37.8 43.3  MCV 88.4 89.2 87.7  PLT 140* 145* 184   Cardiac Enzymes: No results found for this basename: CKTOTAL:5,CKMB:5,CKMBINDEX:5,TROPONINI:5 in the last 168 hours BNP: BNP (last 3 results) No results found for this basename: PROBNP:3 in the last 8760 hours CBG:  Lab 01/25/12 1207 01/25/12 0836 01/25/12 0343 01/24/12 2325 01/24/12 1934  GLUCAP 157* 227* 168* 167* 247*    Time coordinating discharge:35 minutes  Signed:  Jermarion Poffenberger  [facility] 01/25/2012, 12:12 PM

## 2012-02-02 DIAGNOSIS — K56609 Unspecified intestinal obstruction, unspecified as to partial versus complete obstruction: Secondary | ICD-10-CM | POA: Diagnosis not present

## 2012-02-02 DIAGNOSIS — Z23 Encounter for immunization: Secondary | ICD-10-CM | POA: Diagnosis not present

## 2012-03-08 ENCOUNTER — Telehealth: Payer: Self-pay | Admitting: *Deleted

## 2012-03-08 ENCOUNTER — Ambulatory Visit (INDEPENDENT_AMBULATORY_CARE_PROVIDER_SITE_OTHER): Payer: Medicare Other | Admitting: General Surgery

## 2012-03-08 ENCOUNTER — Encounter (INDEPENDENT_AMBULATORY_CARE_PROVIDER_SITE_OTHER): Payer: Self-pay | Admitting: General Surgery

## 2012-03-08 VITALS — BP 126/84 | HR 84 | Temp 98.3°F | Resp 18 | Ht 63.5 in | Wt 229.8 lb

## 2012-03-08 DIAGNOSIS — K430 Incisional hernia with obstruction, without gangrene: Secondary | ICD-10-CM | POA: Diagnosis not present

## 2012-03-08 NOTE — Patient Instructions (Signed)
You have a hernia in your midline abdominal incision. This is due to breakdown of the muscle tissue from your previous surgery.. There is a loop of intestine that seems to be in the hernia on the recent CAT scan last month.  You were hospitalized one month ago with a partial small bowel obstruction which resolved. The hernia may be the cause of this.  Before we decide to perform a big operation on the hernia, I would like you to be evaluated by your cancer physicians, Dr. Welton Flakes and Dr. Nelly Rout, to be sure that you are cancer free and to understand what your long-term situation is.  Return to see Dr. Derrell Lolling after Thanksgiving after you have seen the other doctors and we will decide whether to go ahead with surgery to repair the hernia at that time.    Hernia, Surgical Repair A hernia occurs when an internal organ pushes out through a weak spot in the belly (abdominal) wall muscles. Hernias commonly occur in the groin and around the navel. Hernias often can be pushed back into place (reduced). Most hernias tend to get worse over time. Problems occur when abdominal contents get stuck in the opening (incarcerated hernia). The blood supply gets cut off (strangulated hernia). This is an emergency and needs surgery. Otherwise, hernia repair can be an elective procedure. This means you can schedule this at your convenience when an emergency is not present. Because complications can occur, if you decide to repair the hernia, it is best to do it soon. When it becomes an emergency procedure, there is increased risk of complications after surgery. CAUSES   Heavy lifting.  Obesity.  Prolonged coughing.  Straining to move your bowels.  Hernias can also occur through a cut (incision) by a surgeonafter an abdominal operation. HOME CARE INSTRUCTIONS Before the repair:  Bed rest is not required. You may continue your normal activities, but avoid heavy lifting (more than 10 pounds) or straining. Cough gently.  If you are a smoker, it is best to stop. Even the best hernia repair can break down with the continual strain of coughing.  Do not wear anything tight over your hernia. Do not try to keep it in with an outside bandage or truss. These can damage abdominal contents if they are trapped in the hernia sac.  Eat a normal diet. Avoid constipation. Straining over long periods of time to have a bowel movement will increase hernia size. It also can breakdown repairs. If you cannot do this with diet alone, laxatives or stool softeners may be used. PRIOR TO SURGERY, SEEK IMMEDIATE MEDICAL CARE IF: You have problems (symptoms) of a trapped (incarcerated) hernia. Symptoms include:  An oral temperature above 102 F (38.9 C) develops, or as your caregiver suggests.  Increasing abdominal pain.  Feeling sick to your stomach(nausea) and vomiting.  You stop passing gas or stool.  The hernia is stuck outside the abdomen, looks discolored, feels hard, or is tender.  You have any changes in your bowel habits or in the hernia that is unusual for you. LET YOUR CAREGIVERS KNOW ABOUT THE FOLLOWING:  Allergies.  Medications taken including herbs, eye drops, over the counter medications, and creams.  Use of steroids (by mouth or creams).  Family or personal history of problems with anesthetics or Novocaine.  Possibility of pregnancy, if this applies.  Personal history of blood clots (thrombophlebitis).  Family or personal history of bleeding or blood problems.  Previous surgery.  Other health problems. BEFORE THE PROCEDURE You  should be present 1 hour prior to your procedure, or as directed by your caregiver.  AFTER THE PROCEDURE After surgery, you will be taken to the recovery area. A nurse will watch and check your progress there. Once you are awake, stable, and taking fluids well, you will be allowed to go home as long as there are no problems. Once home, an ice pack (wrapped in a light towel)  applied to your operative site may help with discomfort. It may also keep the swelling down. Do not lift anything heavier than 10 pounds (4.55 kilograms). Take showers not baths. Do not drive while taking narcotics. Follow instructions as suggested by your caregiver.  SEEK IMMEDIATE MEDICAL CARE IF: After surgery:  There is redness, swelling, or increasing pain in the wound.  There is pus coming from the wound.  There is drainage from a wound lasting longer than 1 day.  An unexplained oral temperature above 102 F (38.9 C) develops.  You notice a foul smell coming from the wound or dressing.  There is a breaking open of a wound (edged not staying together) after the sutures have been removed.  You notice increasing pain in the shoulders (shoulder strap areas).  You develop dizzy episodes or fainting while standing.  You develop persistent nausea or vomiting.  You develop a rash.  You have difficulty breathing.  You develop any reaction or side effects to medications given. MAKE SURE YOU:   Understand these instructions.  Will watch your condition.  Will get help right away if you are not doing well or get worse. Document Released: 10/26/2000 Document Revised: 07/25/2011 Document Reviewed: 09/18/2007 [facility] Patient Information 2013 [place], [place].

## 2012-03-08 NOTE — Telephone Encounter (Signed)
Brittany Porter called from Dr. Derrell Lolling requesting pt f/u with MD prior 07/12/12 and her possible surgery for an assessment of her cancer status and prognosis. Will review with MD

## 2012-03-08 NOTE — Telephone Encounter (Signed)
i can see her on 10/29 at 9:30 am

## 2012-03-08 NOTE — Progress Notes (Addendum)
Patient ID: Brittany Porter, female   DOB: 07/14/1945, 66 y.o.   MRN: [id number]  No chief complaint on file.   HPI Brittany Porter is a 66 y.o. female.  She comes to see me for evaluation and management of a ventral incisional hernia.  This patient has a complex medical history. She underwent bilateral modified radical mastectomy by Dr. Rolene Course many years ago. 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 Edwyna Shell in 2012. That has resolved and she is on antiestrogen therapy. I do not know if she has any other metastatic disease identified. 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 I performed wide excision and sentinel node biopsy 2 years ago. There is no known recurrence to date. In addition she has a history of ovarian and endometrial cancer, treated and followed by Dr. [name] Schimke. She's had abdominal hysterectomy, bilateral  oophorectomy, omentectomy, and postop perineal radiation therapy in June of 2010. I am not aware of any recurrence of that cancer.  In early September of this year she was admitted to the hospital with a small bowel obstruction. She required a nasogastric tube for 3 days and then the bowel obstruction resolved. She has felt fine since then has no pain tolerating diet normal bowel function. She was unaware that she had a hernia as her symptoms were diffuse pain, not focal pain. She was seen by Dr. Harden Mo and Dr. Jimmye Norman  in the hospital. A CT scan was performed on 01/21/2012 shows a ventral incisional hernia near the umbilicus. This contains a loop of bowel and possibly a source of obstruction. She had a fatty liver. There is scarring in her lung bases. There was no abdominal mass or sign of cancer.  Upon discharge she was told to come back and see me to discuss this. She is under the impression from conversation in the hospital that tissue the hernia would be a "outpatient procedure". I have told her  that is not the case. She is asymptomatic today and here with her husband. HPI  Past Medical History  Diagnosis Date  . Pleural effusion 04/2010  . Breast cancer   . Ovarian cancer   . Endometrial carcinoma 10/2008  . Iron deficiency   . Diabetes mellitus   . Hyperlipidemia   . Fluid overload   . Arthritis     Past Surgical History  Procedure Date  . Mastectomy 1997    Bilateral  . Abdominal hysterectomy 10/2008  . Melanoma removal 02/2009, 07/2009  . Knee arthroscopy 04/2010    Right knee  . Breast surgery 04/04/96    bil masty    Family History  Problem Relation Age of Onset  . Stroke Mother   . Cancer Father   . Colon cancer Sister   . Stroke Sister   . Cancer Sister     breast  . Heart attack Brother   . Cancer Brother     skin  . Skin cancer Brother   . Cancer Sister     colon    Social History History  Substance Use Topics  . Smoking status: Never Smoker   . Smokeless tobacco: Not on file  . Alcohol Use: No    Allergies  Allergen Reactions  . Sulfa Antibiotics     "Eyes swelled shut"    Current Outpatient Prescriptions  Medication Sig Dispense Refill  . Calcium Carbonate-Vitamin D (CALCIUM 600+D) 600-400 MG-UNIT per tablet  Take 2 tablets by mouth daily.      . Cholecalciferol (VITAMIN D3) 2000 UNITS TABS Take 2,000 mg by mouth daily.      Tery Sanfilippo Calcium (STOOL SOFTENER PO) Take by mouth.      . ferrous sulfate 325 (65 FE) MG tablet Take 325 mg by mouth daily with breakfast.        . fish oil-omega-3 fatty acids 1000 MG capsule Take 2 g by mouth daily.        . furosemide (LASIX) 20 MG tablet Take 20 mg by mouth daily.       Marland Kitchen letrozole (FEMARA) 2.5 MG tablet Take 2.5 mg by mouth daily.        . meloxicam (MOBIC) 7.5 MG tablet Take 7.5 mg by mouth 2 (two) times daily.       . metFORMIN (GLUCOPHAGE) 500 MG tablet Take 500 mg by mouth 2 (two) times daily with a meal.        . prochlorperazine (COMPAZINE) 10 MG tablet Take 10 mg by mouth as  needed. For nausea      . simvastatin (ZOCOR) 40 MG tablet Take 40 mg by mouth at bedtime.          Review of Systems Review of Systems  Constitutional: Negative for fever, chills and unexpected weight change.  HENT: Negative for hearing loss, congestion, sore throat, trouble swallowing and voice change.   Eyes: Negative for visual disturbance.  Respiratory: Negative for cough and wheezing.   Cardiovascular: Negative for chest pain, palpitations and leg swelling.  Gastrointestinal: Negative for nausea, vomiting, abdominal pain, diarrhea, constipation, blood in stool, abdominal distention and anal bleeding.  Genitourinary: Negative for hematuria, vaginal bleeding and difficulty urinating.  Musculoskeletal: Negative for arthralgias.  Skin: Negative for rash and wound.  Neurological: Negative for seizures, syncope and headaches.  Hematological: Negative for adenopathy. Does not bruise/bleed easily.  Psychiatric/Behavioral: Negative for confusion.    Blood pressure 126/84, pulse 84, temperature 98.3 F (36.8 C), resp. rate 18, height 5' 3.5" (1.613 m), weight 229 lb 12.8 oz (104.237 kg).  Physical Exam Physical Exam  Constitutional: She is oriented to person, place, and time. She appears well-developed and well-nourished. No distress.       Morbidly obese, weight 229.8 pounds.  HENT:  Head: Normocephalic and atraumatic.  Nose: Nose normal.  Mouth/Throat: No oropharyngeal exudate.  Eyes: Conjunctivae normal and EOM are normal. Pupils are equal, round, and reactive to light. Left eye exhibits no discharge. No scleral icterus.  Neck: Neck supple. No JVD present. No tracheal deviation present. No thyromegaly present.  Cardiovascular: Normal rate, regular rhythm, normal heart sounds and intact distal pulses.   No murmur heard. Pulmonary/Chest: Effort normal and breath sounds normal. No respiratory distress. She has no wheezes. She has no rales. She exhibits no tenderness.       No axillary  adenopathy.  Abdominal: Soft. Bowel sounds are normal. She exhibits no distension and no mass. There is no tenderness. There is no rebound and no guarding.       Morbidly obese. Panniculus present. Midline incision starting above the umbilicus and extending below the umbilicus. There may be a little fullness but I cannot feel a discrete hernia sac. Abdomen nondistended. Nontender.  Musculoskeletal: She exhibits no edema and no tenderness.  Lymphadenopathy:    She has no cervical adenopathy.  Neurological: She is alert and oriented to person, place, and time. She exhibits normal muscle tone. Coordination normal.  Skin: Skin is  warm. No rash noted. She is not diaphoretic. No erythema. No pallor.       Complex scar on the upper mid back from melanoma excision. No sign of recurrent cancer or satellite nodules.  Psychiatric: She has a normal mood and affect. Her behavior is normal. Judgment and thought content normal.    Data Reviewed My old records from melanoma management. Hospital records from recent hospitalization. CT scan.  Assessment    Ventral incisional hernia containing a loop of bowel. Optimum management would be surgical repair with inlay mesh. This could be attempted laparoscopically, but, considering her previous surgery and radiation therapy, adhesions may be severe and may require open repair  Recent hospitalization for partial small bowel obstruction, resolved and asymptomatic. The hernia may have caused this.  History of breast cancer, bilateral mastectomy, with metastatic pleural effusion managed by chest tube and antiestrogen therapy. This may be under control at this time.  History of malignant melanoma of back. No known recurrence at this time.  History of malignant ovarian or endometrial cancer, status post hysterectomy, BSO, omentectomy, and postop perineal radiation. The patient is not aware of any recurrence.  Morbid obesity  Diabetes mellitus type  2  Hyperlipidemia.    Plan    Hessie Diener discussion with the patient and her husband. I told her that the ideal management would be surgical repair. Laparoscopy could be attempted but  she is at high risk for conversion to an open operation. Because of her numerous comorbidities she is at significantly increased risk for wound complications, pulmonary complications, thromboembolic complications. I told her that she could choose to treat this with observation but that she is at increased risk for enlargement of the hernia, recurrent pain, and recurrent obstruction or strangulation.  She'll be referred back to Dr. Welton Flakes and Dr. Murvin Natal for an assessment of her cancer status and prognosis.  She will return to see me in early December and we will make a final decision about proceeding with the surgery, assuming that we get a go-ahead from her oncologists.       Angelia Mould. Derrell Lolling, M.D., [facility] General and Minimally invasive Surgery Breast and Colorectal Surgery Office:   [phone] Pager:   [phone]  03/08/2012, 9:59 AM

## 2012-03-08 NOTE — Telephone Encounter (Signed)
trying to coordinate with the apppointment the patient has with dr.brewster

## 2012-03-08 NOTE — Telephone Encounter (Signed)
Pt advised she will be out of town on 03/13/12. Pt advised her appt with Dr. Derrell Lolling is 04/19/12. Pt already has an appt on 11/21. Requested to have appt with MD same day so she can make one trip. Call transferred to scheduling to confirm appt date/time.

## 2012-04-05 ENCOUNTER — Ambulatory Visit (HOSPITAL_BASED_OUTPATIENT_CLINIC_OR_DEPARTMENT_OTHER): Payer: Medicare Other | Admitting: Oncology

## 2012-04-05 ENCOUNTER — Ambulatory Visit: Payer: Medicare Other | Attending: Gynecologic Oncology | Admitting: Gynecologic Oncology

## 2012-04-05 ENCOUNTER — Encounter: Payer: Self-pay | Admitting: Oncology

## 2012-04-05 ENCOUNTER — Encounter: Payer: Self-pay | Admitting: Gynecologic Oncology

## 2012-04-05 VITALS — BP 118/64 | HR 84 | Temp 98.9°F | Resp 18 | Ht 62.99 in | Wt 234.6 lb

## 2012-04-05 VITALS — BP 118/64 | HR 84 | Temp 98.9°F | Resp 20 | Ht 62.0 in | Wt 234.0 lb

## 2012-04-05 DIAGNOSIS — C4359 Malignant melanoma of other part of trunk: Secondary | ICD-10-CM | POA: Diagnosis not present

## 2012-04-05 DIAGNOSIS — C569 Malignant neoplasm of unspecified ovary: Secondary | ICD-10-CM | POA: Diagnosis not present

## 2012-04-05 DIAGNOSIS — B372 Candidiasis of skin and nail: Secondary | ICD-10-CM

## 2012-04-05 DIAGNOSIS — Z170 Estrogen receptor positive status [ER+]: Secondary | ICD-10-CM | POA: Diagnosis not present

## 2012-04-05 DIAGNOSIS — C50919 Malignant neoplasm of unspecified site of unspecified female breast: Secondary | ICD-10-CM | POA: Diagnosis not present

## 2012-04-05 DIAGNOSIS — C549 Malignant neoplasm of corpus uteri, unspecified: Secondary | ICD-10-CM

## 2012-04-05 DIAGNOSIS — C55 Malignant neoplasm of uterus, part unspecified: Secondary | ICD-10-CM

## 2012-04-05 DIAGNOSIS — C541 Malignant neoplasm of endometrium: Secondary | ICD-10-CM

## 2012-04-05 MED ORDER — NYSTATIN 100000 UNIT/GM EX POWD: Freq: Four times a day (QID) | CUTANEOUS | Status: DC

## 2012-04-05 NOTE — Progress Notes (Signed)
Follow Up Note: Gyn-Onc  Brittany Porter 66 y.o. female  CC:  Chief Complaint  Patient presents with  . Ovarian Cancer    Follow up    HPI:  Brittany Porter is a 66 year old female referred by Dr. Elmon Kirschner regarding new onset of abdominal pain and findings of a pelvic mass in May 2010.  On October 14, 2008, she underwent a total abdominal hysterectomy, bilateral salpingo- oophorectomy, omentectomy, appendectomy, umbilical hernia repair, and operative staging of presumed endometrial adenocarcinoma. Intraoperative findings were also notable for an endometrioid cancer of the right ovary.  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 Subsequently, she received 6 cycles of Taxol and carboplatin therapy with the last cycle administered in November 2010.  She then underwent vaginal cuff brachytherapy for stage II endometrial cancer, which was completed in January 2011.  Recurrence of her breast cancer, managed by Dr. Welton Flakes, was discovered in December of 2011 with the initiation of letrozole therapy in January of 2012.      Interval History: She presents today for endometrial and ovarian cancer surveillance.  Since her last visit, she reports that she had been doing "well until September."  She was admitted on January 21, 2012 for a partial small bowel obstruction.  She states that she developed abdominal pain on Friday, September 6 with one episode of emesis that evening.  Also reporting that she had two formed bowel movements Friday morning.  The next morning, she stated she went to her grandson's game and had to go to the emergency room from there due to severe abdominal pain.  She was admitted from 01/21/12 to 01/25/12.  A CT scan performed on 01/21/2012 revealed a ventral abdominal wall hernia, likely incisional.  The hernia contains bowel.  There were no significant findings of pelvic masses,  pelvic lymphadenopathy, or pelvic fluid collections.  Today, she reports that she is to follow up with Dr. Nelly Rout and Dr. Welton Flakes per Dr. Jacinto Halim request for surgical clearance from an oncology standpoint for a hernia repair.  She reports intermittent mild lower back pain and left ankle pain related to arthritis, which she takes Mobic for.  Denies abdominal pain, vaginal bleeding/discharge, rectal bleeding, hematuria, nausea, vomiting, chest pain, and cough.  Last colonoscopy was one year ago.    Review of Systems Constitutional:  Feels well.  Plans to go to the beach for Thanksgiving.  Skin:  Candidiasis of the inguinal area mons and vulva, which was present last visit. Reporting improvement with application of nystatin.  Blood sugar this am was 152.    Cardiovascular:  No chest pain, shortness of breath, or edema.  Pulmonary:  No cough or wheeze.  Gastrointestinal:  No nausea, vomiting, or diarrhea. No bright red blood per rectum, no abdominal pain, change in bowel movement, or constipation.  Genitourinary:  No frequency, urgency, dysuria.  Musculoskeletal:  No myalgia or joint swelling.  Reporting intermittent lower back pain and left ankle pain. Neurologic:  No weakness, numbness, or change in gait.  Psychology:  No depression, anxiety, or insomnia.   Current Meds:  Outpatient Encounter Prescriptions as of 04/05/2012  Medication Sig Dispense Refill  . Calcium Carbonate-Vitamin D (CALCIUM 600+D) 600-400 MG-UNIT per tablet Take 2 tablets by mouth daily.      . Cholecalciferol (VITAMIN D3) 2000 UNITS TABS Take 2,000 mg by mouth daily.      Tery Sanfilippo Calcium (STOOL SOFTENER PO) Take by mouth.      Marland Kitchen  ferrous sulfate 325 (65 FE) MG tablet Take 325 mg by mouth daily with breakfast.        . fish oil-omega-3 fatty acids 1000 MG capsule Take 2 g by mouth daily.        . furosemide (LASIX) 20 MG tablet Take 20 mg by mouth daily.       Marland Kitchen letrozole (FEMARA) 2.5 MG tablet Take 2.5 mg by mouth daily.         . meloxicam (MOBIC) 7.5 MG tablet Take 7.5 mg by mouth 2 (two) times daily.       . metFORMIN (GLUCOPHAGE) 500 MG tablet Take 500 mg by mouth 2 (two) times daily with a meal.        . prochlorperazine (COMPAZINE) 10 MG tablet Take 10 mg by mouth as needed. For nausea      . simvastatin (ZOCOR) 40 MG tablet Take 40 mg by mouth at bedtime.          Allergy:  Allergies  Allergen Reactions  . Sulfa Antibiotics     "Eyes swelled shut"    Social Hx:   History   Social History  . Marital Status: Married    Spouse Name: N/A    Number of Children: N/A  . Years of Education: N/A   Occupational History  . Not on file.   Social History Main Topics  . Smoking status: Never Smoker   . Smokeless tobacco: Not on file  . Alcohol Use: No  . Drug Use: No  . Sexually Active: Yes   Other Topics Concern  . Not on file   Social History Narrative  . No narrative on file    Past Surgical Hx:  Past Surgical History  Procedure Date  . Mastectomy 1997    Bilateral  . Abdominal hysterectomy 10/2008  . Melanoma removal 02/2009, 07/2009  . Knee arthroscopy 04/2010    Right knee  . Breast surgery 04/04/96    bil masty    Past Medical Hx:  Past Medical History  Diagnosis Date  . Pleural effusion 04/2010  . Breast cancer   . Ovarian cancer   . Endometrial carcinoma 10/2008  . Iron deficiency   . Diabetes mellitus   . Hyperlipidemia   . Fluid overload   . Arthritis     Family Hx:  Family History  Problem Relation Age of Onset  . Stroke Mother   . Cancer Father   . Colon cancer Sister   . Stroke Sister   . Cancer Sister     breast  . Heart attack Brother   . Cancer Brother     skin  . Skin cancer Brother   . Cancer Sister     colon    Vitals:  Blood pressure 118/64, pulse 84, temperature 98.9 F (37.2 C), temperature source Oral, resp. rate 18, height 5' 2.99" (1.6 m), weight 234 lb 9.6 oz (106.414 kg).  Physical Exam:  General:  Well developed, well nourished  female in no acute distress.  Alert and oriented x 3.  Neck:  Supple without any enlargements.  Lymph node survey:  No cervical, supraclavicular, or inguinal adenopathy  Cardiovascular:  Regular rate and rhythm.  S1 and S2 normal.  Lungs:  Clear to auscultation bilaterally.  No wheezes/crackles/rhonchi noted.  Skin:  Erythema consistent with candidiasis of the inguinal folds labial folds and the mons. No evidence of skin breakdown  Back: No CVA tenderness  Abdomen: Abdomen soft, non-tender and obese. Active bowel  sounds in all quadrants.  No evidence of a fluid wave or abdominal masses.  Genitourinary:    Vulva/vagina: Normal external female genitalia. No lesions.    Urethra: No lesions or masses    Vagina: Atrophic without any lesions. No palpable masses.  No vaginal bleeding or drainage noted.    Rectal:  Good tone, no masses no cul de sac nodularity.  Extremities:  No bilateral cyanosis or clubbing.  Mild pedal edema noted with the left foot.  Assessment/Plan:  Brittany Porter is a 66 year old who underwent a total abdominal hysterectomy, bilateral salpingo- oophorectomy, omentectomy, appendectomy, and umbilical hernia repair with final pathology revealing a stage II endometrial cancer and stage I ovarian cancer in June of 2010. She completed 6 cycles of Taxol and carboplatin therapy in November of 2010.  Vaginal cuff brachytherapy was completed in January 2011. She's been without any evidence of disease since that time.   Her recent admission for a partial small bowel obstruction prompted the need for a hernia repair.  Her examination today was unremarkable.  Dr. Nelly Rout spoke with the patient about the need for the hernia repair.  There are no contraindications from a Gynecologic Oncology standpoint and the patient may proceed with the hernia repair per Dr. [name] Schimke.  She is to see Dr. Welton Flakes after her appointment at Gynecologic Oncology today.  Refills for nystatin powder sent to [facility] on  [facility] for skin candidiasis.  Instructed to continue monitoring blood sugars with the hope that better glucose control may lead to a decrease in candidiasis flares.  Survivorship packet discussed with reportable signs and symptoms, recommended preventative care screenings per the Society of Gynecologic Oncology, and support services offered through the [facility].  She is to followup with Dr. Roselind Messier in July of 2014 and with the GYN oncology service in November of 2014.      CROSS, MELISSA DEAL, NP 04/05/2012, 11:17 AM

## 2012-04-05 NOTE — Progress Notes (Signed)
OFFICE PROGRESS NOTE  CC  Dr. Claud Kelp Dr. Antony Blackbird, Dr. Merri Brunette  DIAGNOSIS: 66 year old female with  #1 stage II endometrial carcinoma and stage I ovarian carcinoma diagnosed in June 2010.  #2 malignant melanoma diagnosed August 2010 status post wide local excision. 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  CURRENT THERAPY: letrozole 2.5 mg daily since January 2012  INTERVAL HISTORY: Brittany Porter 66 y.o. female returns for followup visit today. Overall she is doing well. Patient was recently hospitalized with what sounds like strangulated hernia with bowel obstruction. She stayed in the hospital for about 5 days. She was seen by surgery. Recently she saw Dr. Claud Kelp for consideration of hernia repair. He suggested that he be seen by me as well as Dr. Nelly Rout from going on for clearance. SHe otherwise feels he is doing well. SHe has no fevers chills night sweats or headaches.  MEDICAL HISTORY:iron deficiency hyperlipidemia diabetesfluid overall  ALLERGIES:  is allergic to sulfa antibiotics.  MEDICATIONS:  Current Outpatient Prescriptions  Medication Sig Dispense Refill  . Calcium Carbonate-Vitamin D (CALCIUM 600+D) 600-400 MG-UNIT per tablet Take 2 tablets by mouth daily.      . Cholecalciferol (VITAMIN D3) 2000 UNITS TABS Take 2,000 mg by mouth daily.      Tery Sanfilippo Calcium (STOOL SOFTENER PO) Take by mouth.      . ferrous sulfate 325 (65 FE) MG tablet Take 325 mg by mouth daily with breakfast.        . fish oil-omega-3 fatty acids 1000 MG capsule Take 2 g by mouth daily.        . furosemide (LASIX) 20 MG tablet Take 20 mg by mouth daily.       Marland Kitchen letrozole (FEMARA) 2.5 MG tablet Take 2.5 mg by mouth daily.        . meloxicam (MOBIC) 7.5 MG tablet Take 7.5 mg by mouth 2 (two) times daily.       . metFORMIN (GLUCOPHAGE) 500 MG tablet Take 500 mg by mouth 2  (two) times daily with a meal.        . nystatin (MYCOSTATIN) powder Apply topically 4 (four) times daily.  15 g  3  . prochlorperazine (COMPAZINE) 10 MG tablet Take 10 mg by mouth as needed. For nausea      . simvastatin (ZOCOR) 40 MG tablet Take 40 mg by mouth at bedtime.          SURGICAL HISTORY:   #1 status post bilateral mastectomies  #2 status post bilateral oophorectomies and hysterectomy.  #3 status post wide local excision of malignant melanoma of the back.  REVIEW OF SYSTEMS:  Pertinent items are noted in HPI.   PHYSICAL EXAMINATION:  Gen.: Awake alert in no acute distress well appearing female HEENT: EOMI PERRLA sclerae anicteric no conjunctival pallor oral mucosa is moist neck is supple there is no palpable cervical supraclavicular or axillary adenopathy  Lungs: Clear bilaterally Cardiovascular: Regular rate rhythm Abdomen: Soft nontender nondistended bowel sounds are present healed surgical scars no hepatosplenomegaly Extremities: +1 edema Neuro: Alert oriented otherwise nonfocal Skin: Back upper back reveals well-healed surgical scar from quite local excision of melanoma. No CVA or spinal tenderness Bilateral mastectomy scars looks well healed there is no nodularity  or masses ECOG PERFORMANCE STATUS: 1 - Symptomatic but completely ambulatory  Blood pressure 118/64, pulse 84, temperature 98.9 F (37.2 C), resp. rate 20, height 5'  2" (1.575 m), weight 234 lb (106.142 kg).  LABORATORY DATA: Lab Results  Component Value Date   WBC 6.9 01/24/2012   HGB 13.2 01/24/2012   HCT 39.5 01/24/2012   MCV 88.4 01/24/2012   PLT 140* 01/24/2012      Chemistry      Component Value Date/Time   NA 142 01/24/2012 0605   NA 140 11/17/2009 1136   K 4.1 01/24/2012 0605   K 4.2 11/17/2009 1136   CL 102 01/24/2012 0605   CL 103 11/17/2009 1136   CO2 29 01/24/2012 0605   CO2 28 11/17/2009 1136   BUN 6 01/24/2012 0605   BUN 13 11/17/2009 1136   CREATININE 0.53 01/24/2012 0605   CREATININE 0.6  11/17/2009 1136      Component Value Date/Time   CALCIUM 9.2 01/24/2012 0605   CALCIUM 9.7 11/17/2009 1136   ALKPHOS 44 01/22/2012 0630   ALKPHOS 60 11/17/2009 1136   AST 45* 01/22/2012 0630   AST 24 11/17/2009 1136   ALT 33 01/22/2012 0630   BILITOT 0.9 01/22/2012 0630   BILITOT 1.00 11/17/2009 1136       RADIOGRAPHIC STUDIES:  No results found.  ASSESSMENT: 66 year old female with  #1 3 primary malignancies including stage II endometrial carcinoma and stage I ovarian carcinoma diagnosed in June 2010. She was treated aggressively with chemotherapy and radiation. Patient also had a malignant melanoma and she underwent a wide local excision in August 2010. In December 2011 patient developed recurrence of her breast cancer and she was subsequently started on letrozole 2.5 mg daily starting in January 2012. Overall patient is doing remarkably well and we will continue her present treatment.  #2 recent history of bowel obstruction   PLAN:   #1 continue letrozole 2.5 mg daily.   #2 She will continue to followup with Dr. [name] Schimke a  #3 patient and I discussed her upcoming surgery I do think it is very appropriate for her to proceed with hernia repair. She should not have any contraindications from her oncology perspective.  All questions were answered. The patient knows to call the clinic with any problems, questions or concerns. We can certainly see the patient much sooner if necessary.  I spent 20 minutes counseling the patient face to face. The total time spent in the appointment was 30 minutes.    Drue Second, MD Medical/Oncology [facility] [phone] (beeper) [phone] (Office)  04/05/2012, 12:04 PM

## 2012-04-05 NOTE — Patient Instructions (Addendum)
You may proceed with a hernia repair from a Gynecologic Oncology standpoint.  Refills for nystatin powder sent to [facility] on [address] [facility] for skin candidiasis.  Please continue monitoring blood sugars with the hope that better glucose control may lead to a decrease in candidiasis flares.  Please review the survivorship packet with reportable signs and symptoms, recommended preventative care screenings per the Society of Gynecologic Oncology, and support services offered through the [facility].  Followup with Dr. Roselind Messier in July of 2014 and with the GYN oncology service in November of 2014.     Thank you for coming to see me today.  I appreciate your confidence in choosing [facility] Gynecologic Oncology for your medical care.  If you have any questions about your visit today, please call our office and we will get back to you as soon as possible.  Warner Mccreedy, NP Gynecologic Oncology

## 2012-04-19 ENCOUNTER — Encounter (INDEPENDENT_AMBULATORY_CARE_PROVIDER_SITE_OTHER): Payer: Self-pay | Admitting: General Surgery

## 2012-04-19 ENCOUNTER — Ambulatory Visit (INDEPENDENT_AMBULATORY_CARE_PROVIDER_SITE_OTHER): Payer: Medicare Other | Admitting: General Surgery

## 2012-04-19 VITALS — BP 140/86 | HR 76 | Temp 97.2°F | Resp 18 | Ht 63.5 in | Wt 233.6 lb

## 2012-04-19 DIAGNOSIS — K430 Incisional hernia with obstruction, without gangrene: Secondary | ICD-10-CM | POA: Diagnosis not present

## 2012-04-19 NOTE — Patient Instructions (Signed)
You will be scheduled for surgery to repair your incisional hernia. We will attempt to perform this operation laparoscopically, but you are at risk for having it converted to an open procedure because of your previous surgeries.       Hernia A hernia occurs when an internal organ pushes out through a weak spot in the abdominal wall. Hernias most commonly occur in the groin and around the navel. Hernias often can be pushed back into place (reduced). Most hernias tend to get worse over time. Some abdominal hernias can get stuck in the opening (irreducible or incarcerated hernia) and cannot be reduced. An irreducible abdominal hernia which is tightly squeezed into the opening is at risk for impaired blood supply (strangulated hernia). A strangulated hernia is a medical emergency. Because of the risk for an irreducible or strangulated hernia, surgery may be recommended to repair a hernia. CAUSES   Heavy lifting.  Prolonged coughing.  Straining to have a bowel movement.  A cut (incision) made during an abdominal surgery. HOME CARE INSTRUCTIONS   Bed rest is not required. You may continue your normal activities.  Avoid lifting more than 10 pounds (4.5 kg) or straining.  Cough gently. If you are a smoker it is best to stop. Even the best hernia repair can break down with the continual strain of coughing. Even if you do not have your hernia repaired, a cough will continue to aggravate the problem.  Do not wear anything tight over your hernia. Do not try to keep it in with an outside bandage or truss. These can damage abdominal contents if they are trapped within the hernia sac.  Eat a normal diet.  Avoid constipation. Straining over long periods of time will increase hernia size and encourage breakdown of repairs. If you cannot do this with diet alone, stool softeners may be used. SEEK IMMEDIATE MEDICAL CARE IF:   You have a fever.  You develop increasing abdominal pain.  You feel  nauseous or vomit.  Your hernia is stuck outside the abdomen, looks discolored, feels hard, or is tender.  You have any changes in your bowel habits or in the hernia that are unusual for you.  You have increased pain or swelling around the hernia.  You cannot push the hernia back in place by applying gentle pressure while lying down. MAKE SURE YOU:   Understand these instructions.  Will watch your condition.  Will get help right away if you are not doing well or get worse. Document Released: 05/02/2005 Document Revised: 07/25/2011 Document Reviewed: 12/20/2007 [facility] Patient Information 2013 [place], [place].

## 2012-04-19 NOTE — Progress Notes (Signed)
Patient ID: Brittany Porter, female   DOB: January 19, 1946, 66 y.o.   MRN: [id number]  No chief complaint on file.   HPI Brittany Porter is a 66 y.o. female.  She returns for further discussion about management of her ventral incisional hernia.  She has seen Dr. Drue Second who feels that all of her malignancies are under control and that it is appropriate from her point of view to proceed with the hernia repair.  This patient has a complex medical history. She underwent bilateral modified radical mastectomy by Dr. Rolene Course many years ago. 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 Edwyna Shell in 2012. That has resolved and she is on antiestrogen therapy. I do not know if she has any other metastatic disease identified. 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 I performed wide excision and sentinel node biopsy 2 years ago. There is no known recurrence to date. In addition she has a history of ovarian and endometrial cancer, treated and followed by Dr. [name] Schimke. She's had abdominal hysterectomy, bilateral oophorectomy, omentectomy, and postop perineal radiation therapy in June of 2010. I am not aware of any recurrence of that cancer.   In early September of this year she was admitted to the hospital with a small bowel obstruction. She required a nasogastric tube for 3 days and then the bowel obstruction resolved. She has felt fine since then has no pain tolerating diet normal bowel function. She was unaware that she had a hernia as her symptoms were diffuse pain, not focal pain. She was seen by Dr. Harden Mo and Dr. Jimmye Norman in the hospital. A CT scan was performed on 01/21/2012 shows a ventral incisional hernia near the umbilicus. This contains a loop of bowel and possibly a source of obstruction. She had a fatty liver. There is scarring in her lung bases. There was no abdominal mass or sign of cancer.  I had a long discussion with  the patient and her husband today about repairing her ventral hernia. I basically offered to do this and she stated that she wanted to have it repaired. She is aware that we will attempt to do this laparoscopically but she is at high risk for conversion to open surgery, high risk for wound complications, high risk for recurrence. She is aware that she is at high risk for severe adhesions from her previous cancer surgery.  HPI  Past Medical History  Diagnosis Date  . Pleural effusion 04/2010  . Breast cancer   . Ovarian cancer   . Endometrial carcinoma 10/2008  . Iron deficiency   . Diabetes mellitus   . Hyperlipidemia   . Fluid overload   . Arthritis     Past Surgical History  Procedure Date  . Mastectomy 1997    Bilateral  . Abdominal hysterectomy 10/2008  . Melanoma removal 02/2009, 07/2009  . Knee arthroscopy 04/2010    Right knee  . Breast surgery 04/04/96    bil masty    Family History  Problem Relation Age of Onset  . Stroke Mother   . Cancer Father   . Colon cancer Sister   . Stroke Sister   . Cancer Sister     breast  . Heart attack Brother   . Cancer Brother     skin  . Skin cancer Brother   . Cancer Sister     colon    Social History History  Substance  Use Topics  . Smoking status: Never Smoker   . Smokeless tobacco: Not on file  . Alcohol Use: No    Allergies  Allergen Reactions  . Sulfa Antibiotics     "Eyes swelled shut"    Current Outpatient Prescriptions  Medication Sig Dispense Refill  . Calcium Carbonate-Vitamin D (CALCIUM 600+D) 600-400 MG-UNIT per tablet Take 2 tablets by mouth daily.      . Cholecalciferol (VITAMIN D3) 2000 UNITS TABS Take 2,000 mg by mouth daily.      Tery Sanfilippo Calcium (STOOL SOFTENER PO) Take by mouth.      . ferrous sulfate 325 (65 FE) MG tablet Take 325 mg by mouth daily with breakfast.        . fish oil-omega-3 fatty acids 1000 MG capsule Take 2 g by mouth daily.        . furosemide (LASIX) 20 MG tablet Take 20  mg by mouth daily.       Marland Kitchen letrozole (FEMARA) 2.5 MG tablet Take 2.5 mg by mouth daily.        . meloxicam (MOBIC) 7.5 MG tablet Take 7.5 mg by mouth 2 (two) times daily.       . metFORMIN (GLUCOPHAGE) 500 MG tablet Take 500 mg by mouth 2 (two) times daily with a meal.        . nystatin (MYCOSTATIN) powder Apply topically 4 (four) times daily.  15 g  3  . prochlorperazine (COMPAZINE) 10 MG tablet Take 10 mg by mouth as needed. For nausea      . simvastatin (ZOCOR) 40 MG tablet Take 40 mg by mouth at bedtime.          Review of Systems Review of Systems  Constitutional: Negative for fever, chills and unexpected weight change.  HENT: Negative for hearing loss, congestion, sore throat, trouble swallowing and voice change.   Eyes: Negative for visual disturbance.  Respiratory: Negative for cough and wheezing.   Cardiovascular: Negative for chest pain, palpitations and leg swelling.  Gastrointestinal: Negative for nausea, vomiting, abdominal pain, diarrhea, constipation, blood in stool, abdominal distention and anal bleeding.  Genitourinary: Negative for hematuria, vaginal bleeding and difficulty urinating.  Musculoskeletal: Negative for arthralgias.  Skin: Negative for rash and wound.  Neurological: Negative for seizures, syncope and headaches.  Hematological: Negative for adenopathy. Does not bruise/bleed easily.  Psychiatric/Behavioral: Negative for confusion.    Blood pressure 140/86, pulse 76, temperature 97.2 F (36.2 C), resp. rate 18, height 5' 3.5" (1.613 m), weight 233 lb 9.6 oz (105.96 kg).  Physical Exam Physical Exam  Constitutional: She is oriented to person, place, and time. She appears well-developed and well-nourished. No distress.       Weight 233  HENT:  Head: Normocephalic and atraumatic.  Nose: Nose normal.  Mouth/Throat: No oropharyngeal exudate.  Eyes: Conjunctivae normal and EOM are normal. Pupils are equal, round, and reactive to light. Left eye exhibits no  discharge. No scleral icterus.  Neck: Neck supple. No JVD present. No tracheal deviation present. No thyromegaly present.  Cardiovascular: Normal rate, regular rhythm, normal heart sounds and intact distal pulses.   No murmur heard. Pulmonary/Chest: Effort normal and breath sounds normal. No respiratory distress. She has no wheezes. She has no rales. She exhibits no tenderness.  Abdominal: Soft. Bowel sounds are normal. She exhibits no distension and no mass. There is no tenderness. There is no rebound and no guarding.       Morbidly obese with a panniculus present. Midline incision starting  above the umbilicus and extending below the umbilicus. There is some fullness around the umbilicus. She has a known hernia from CT scan. Not distended. Nontender.  Musculoskeletal: She exhibits no edema and no tenderness.  Lymphadenopathy:    She has no cervical adenopathy.  Neurological: She is alert and oriented to person, place, and time. She exhibits normal muscle tone. Coordination normal.  Skin: Skin is warm. No rash noted. She is not diaphoretic. No erythema. No pallor.       Well-healed scar on the upper mid back. No sign of recurrent melanoma.  Psychiatric: She has a normal mood and affect. Her behavior is normal. Judgment and thought content normal.    Data Reviewed CT. Notes from cancer center.  Assessment    Ventral incisional hernia containing a loop of bowel. Will schedule for laparoscopic repair with mesh, high probability of converting to open.  Recent hospitalization for partial small bowel obstruction, resolved and asymptomatic. The hernia may have caused this.  History of breast cancer  History of malignant metastatic pleural effusion Treated with chest tube and anti- estrogen therapy  History malignant melanoma of back, no known recurrence  History malignant ovarian or endometrial cancer, status post TAH, BSO, omentectomy and postop perineal radiation. No known  recurrence  Morbid obesity  Diabetes mellitus  Hyperlipidemia    Plan    Scheduled for laparoscopic repair of ventral incisional hernia with mesh, possible open  I discussed the indications, details, techniques, and numerous risks with the patient and her husband. There were that she is a high-risk patient. Their questions rancher. They agree with this plan.       Angelia Mould. Derrell Lolling, M.D., [facility] General and Minimally invasive Surgery Breast and Colorectal Surgery Office:   [phone] Pager:   [phone]  04/19/2012, 12:11 PM

## 2012-05-14 ENCOUNTER — Encounter (HOSPITAL_COMMUNITY): Payer: Self-pay | Admitting: Pharmacy Technician

## 2012-05-14 ENCOUNTER — Emergency Department (HOSPITAL_COMMUNITY)
Admission: EM | Admit: 2012-05-14 | Discharge: 2012-05-15 | Disposition: A | Discharge status: Home / Self Care | Payer: Medicare Other | Attending: Emergency Medicine | Admitting: Emergency Medicine

## 2012-05-14 DIAGNOSIS — E119 Type 2 diabetes mellitus without complications: Secondary | ICD-10-CM | POA: Insufficient documentation

## 2012-05-14 DIAGNOSIS — Z8543 Personal history of malignant neoplasm of ovary: Secondary | ICD-10-CM | POA: Insufficient documentation

## 2012-05-14 DIAGNOSIS — Z79899 Other long term (current) drug therapy: Secondary | ICD-10-CM | POA: Insufficient documentation

## 2012-05-14 DIAGNOSIS — R1110 Vomiting, unspecified: Secondary | ICD-10-CM

## 2012-05-14 DIAGNOSIS — Z8542 Personal history of malignant neoplasm of other parts of uterus: Secondary | ICD-10-CM | POA: Insufficient documentation

## 2012-05-14 DIAGNOSIS — R109 Unspecified abdominal pain: Secondary | ICD-10-CM | POA: Diagnosis not present

## 2012-05-14 DIAGNOSIS — M129 Arthropathy, unspecified: Secondary | ICD-10-CM | POA: Insufficient documentation

## 2012-05-14 DIAGNOSIS — Z853 Personal history of malignant neoplasm of breast: Secondary | ICD-10-CM | POA: Insufficient documentation

## 2012-05-14 DIAGNOSIS — K7689 Other specified diseases of liver: Secondary | ICD-10-CM | POA: Diagnosis not present

## 2012-05-14 DIAGNOSIS — R112 Nausea with vomiting, unspecified: Secondary | ICD-10-CM | POA: Diagnosis not present

## 2012-05-14 DIAGNOSIS — K439 Ventral hernia without obstruction or gangrene: Secondary | ICD-10-CM | POA: Insufficient documentation

## 2012-05-14 DIAGNOSIS — D649 Anemia, unspecified: Secondary | ICD-10-CM | POA: Insufficient documentation

## 2012-05-14 DIAGNOSIS — Z8719 Personal history of other diseases of the digestive system: Secondary | ICD-10-CM | POA: Insufficient documentation

## 2012-05-14 DIAGNOSIS — E785 Hyperlipidemia, unspecified: Secondary | ICD-10-CM | POA: Insufficient documentation

## 2012-05-14 DIAGNOSIS — N390 Urinary tract infection, site not specified: Secondary | ICD-10-CM

## 2012-05-14 DIAGNOSIS — Z90710 Acquired absence of both cervix and uterus: Secondary | ICD-10-CM | POA: Insufficient documentation

## 2012-05-14 DIAGNOSIS — K469 Unspecified abdominal hernia without obstruction or gangrene: Secondary | ICD-10-CM

## 2012-05-15 ENCOUNTER — Encounter (HOSPITAL_COMMUNITY): Payer: Self-pay | Admitting: *Deleted

## 2012-05-15 ENCOUNTER — Emergency Department (HOSPITAL_COMMUNITY): Payer: Medicare Other

## 2012-05-15 DIAGNOSIS — K7689 Other specified diseases of liver: Secondary | ICD-10-CM | POA: Diagnosis not present

## 2012-05-15 DIAGNOSIS — M129 Arthropathy, unspecified: Secondary | ICD-10-CM | POA: Diagnosis not present

## 2012-05-15 DIAGNOSIS — Z8719 Personal history of other diseases of the digestive system: Secondary | ICD-10-CM | POA: Diagnosis not present

## 2012-05-15 DIAGNOSIS — R109 Unspecified abdominal pain: Secondary | ICD-10-CM | POA: Diagnosis not present

## 2012-05-15 DIAGNOSIS — Z8543 Personal history of malignant neoplasm of ovary: Secondary | ICD-10-CM | POA: Diagnosis not present

## 2012-05-15 DIAGNOSIS — D649 Anemia, unspecified: Secondary | ICD-10-CM | POA: Diagnosis not present

## 2012-05-15 DIAGNOSIS — Z8542 Personal history of malignant neoplasm of other parts of uterus: Secondary | ICD-10-CM | POA: Diagnosis not present

## 2012-05-15 DIAGNOSIS — K439 Ventral hernia without obstruction or gangrene: Secondary | ICD-10-CM | POA: Diagnosis not present

## 2012-05-15 DIAGNOSIS — Z90710 Acquired absence of both cervix and uterus: Secondary | ICD-10-CM | POA: Diagnosis not present

## 2012-05-15 DIAGNOSIS — N390 Urinary tract infection, site not specified: Secondary | ICD-10-CM | POA: Diagnosis not present

## 2012-05-15 DIAGNOSIS — E785 Hyperlipidemia, unspecified: Secondary | ICD-10-CM | POA: Diagnosis not present

## 2012-05-15 DIAGNOSIS — Z79899 Other long term (current) drug therapy: Secondary | ICD-10-CM | POA: Diagnosis not present

## 2012-05-15 DIAGNOSIS — R112 Nausea with vomiting, unspecified: Secondary | ICD-10-CM | POA: Diagnosis not present

## 2012-05-15 DIAGNOSIS — E119 Type 2 diabetes mellitus without complications: Secondary | ICD-10-CM | POA: Diagnosis not present

## 2012-05-15 DIAGNOSIS — Z853 Personal history of malignant neoplasm of breast: Secondary | ICD-10-CM | POA: Diagnosis not present

## 2012-05-15 LAB — COMPREHENSIVE METABOLIC PANEL
ALT: 22 U/L (ref 0–35)
AST: 22 U/L (ref 0–37)
Albumin: 3.8 g/dL (ref 3.5–5.2)
Alkaline Phosphatase: 58 U/L (ref 39–117)
BUN: 14 mg/dL (ref 6–23)
CO2: 28 mEq/L (ref 19–32)
Calcium: 9.5 mg/dL (ref 8.4–10.5)
Chloride: 102 mEq/L (ref 96–112)
Creatinine, Ser: 0.43 mg/dL — ABNORMAL LOW (ref 0.50–1.10)
GFR calc Af Amer: >90 mL/min (ref >90)
GFR calc non Af Amer: >90 mL/min (ref >90)
Glucose, Bld: 248 mg/dL — ABNORMAL HIGH (ref 70–99)
Potassium: 4.1 mEq/L (ref 3.5–5.1)
Sodium: 143 mEq/L (ref 135–145)
Total Bilirubin: 0.7 mg/dL (ref 0.3–1.2)
Total Protein: 7 g/dL (ref 6.0–8.3)

## 2012-05-15 LAB — CBC WITH DIFFERENTIAL/PLATELET
Basophils Absolute: 0 10*3/uL (ref 0.0–0.1)
Basophils Relative: 0 % (ref 0–1)
Eosinophils Absolute: 0.1 10*3/uL (ref 0.0–0.7)
Eosinophils Relative: 1 % (ref 0–5)
HCT: 41.8 % (ref 36.0–46.0)
Hemoglobin: 14 g/dL (ref 12.0–15.0)
Lymphocytes Relative: 10 % — ABNORMAL LOW (ref 12–46)
Lymphs Abs: 1 10*3/uL (ref 0.7–4.0)
MCH: 28.7 pg (ref 26.0–34.0)
MCHC: 33.5 g/dL (ref 30.0–36.0)
MCV: 85.7 fL (ref 78.0–100.0)
Monocytes Absolute: 0.6 10*3/uL (ref 0.1–1.0)
Monocytes Relative: 7 % (ref 3–12)
Neutro Abs: 7.7 10*3/uL (ref 1.7–7.7)
Neutrophils Relative %: 82 % — ABNORMAL HIGH (ref 43–77)
Platelets: 185 10*3/uL (ref 150–400)
RBC: 4.88 MIL/uL (ref 3.87–5.11)
RDW: 13 % (ref 11.5–15.5)
WBC: 9.4 10*3/uL (ref 4.0–10.5)

## 2012-05-15 LAB — URINE MICROSCOPIC-ADD ON

## 2012-05-15 LAB — URINALYSIS, ROUTINE W REFLEX MICROSCOPIC
Bilirubin Urine: NEGATIVE
Glucose, UA: NEGATIVE mg/dL
Hgb urine dipstick: NEGATIVE
Ketones, ur: 15 mg/dL — AB
Nitrite: NEGATIVE
Protein, ur: NEGATIVE mg/dL
Specific Gravity, Urine: 1.022 (ref 1.005–1.030)
Urobilinogen, UA: 0.2 mg/dL (ref 0.0–1.0)
pH: 7 (ref 5.0–8.0)

## 2012-05-15 MED ORDER — OXYCODONE-ACETAMINOPHEN 5-325 MG PO TABS: 1.0000 | ORAL_TABLET | ORAL | Status: DC | PRN

## 2012-05-15 MED ORDER — ONDANSETRON HCL 4 MG PO TABS: 4.0000 mg | ORAL_TABLET | Freq: Four times a day (QID) | ORAL | Status: DC

## 2012-05-15 MED ORDER — MORPHINE SULFATE 2 MG/ML IJ SOLN
2.0000 mg | INTRAMUSCULAR | Status: DC | PRN
  Administered 2012-05-15: 2 mg via INTRAVENOUS
  Filled 2012-05-15: qty 1

## 2012-05-15 MED ORDER — CIPROFLOXACIN IN D5W 400 MG/200ML IV SOLN
400.0000 mg | Freq: Once | INTRAVENOUS | Status: AC
  Administered 2012-05-15: 400 mg via INTRAVENOUS
  Filled 2012-05-15: qty 200

## 2012-05-15 MED ORDER — SODIUM CHLORIDE 0.9 % IV BOLUS (SEPSIS)
1000.0000 mL | Freq: Once | INTRAVENOUS | Status: AC
  Administered 2012-05-15: 1000 mL via INTRAVENOUS

## 2012-05-15 MED ORDER — ONDANSETRON HCL 4 MG/2ML IJ SOLN
4.0000 mg | Freq: Once | INTRAMUSCULAR | Status: AC
  Administered 2012-05-15: 4 mg via INTRAVENOUS
  Filled 2012-05-15: qty 8

## 2012-05-15 MED ORDER — IOHEXOL 300 MG/ML  SOLN
100.0000 mL | Freq: Once | INTRAMUSCULAR | Status: AC | PRN
  Administered 2012-05-15: 100 mL via INTRAVENOUS

## 2012-05-15 MED ORDER — CIPROFLOXACIN HCL 500 MG PO TABS: 500.0000 mg | ORAL_TABLET | Freq: Two times a day (BID) | ORAL | Status: DC

## 2012-05-15 NOTE — ED Notes (Signed)
Pt c/o abd pain since noon; worse since 1800; vomiting; feels like pain from previous bowel obstruction two months ago

## 2012-05-16 LAB — URINE CULTURE: Colony Count: 25000

## 2012-05-16 NOTE — ED Provider Notes (Signed)
History     CSN: [id number]  Arrival date & time 05/14/12  2335   First MD Initiated Contact with Patient 05/15/12 0158      Chief Complaint  Patient presents with  . Abdominal Pain    (Consider location/radiation/quality/duration/timing/severity/associated sxs/prior treatment) HPI 67 year old female presents to emergency room complaining of diffuse abdominal pain, distention, nausea and vomiting since around noon today. Patient has history of partial small bowel obstruction 2 months ago. This was treated with NG tube alone. Patient has followup with Dr. Derrell Lolling with [name] for repair of the abdominal hernia in 2 weeks. She's had no fevers. She had normal bowel movement yesterday. She is passing gas. No unusual foods. No sick contacts.  Past Medical History  Diagnosis Date  . Pleural effusion 04/2010  . Breast cancer   . Ovarian cancer   . Endometrial carcinoma 10/2008  . Iron deficiency   . Diabetes mellitus   . Hyperlipidemia   . Fluid overload   . Arthritis     Past Surgical History  Procedure Date  . Mastectomy 1997    Bilateral  . Abdominal hysterectomy 10/2008  . Melanoma removal 02/2009, 07/2009  . Knee arthroscopy 04/2010    Right knee  . Breast surgery 04/04/96    bil masty    Family History  Problem Relation Age of Onset  . Stroke Mother   . Cancer Father   . Colon cancer Sister   . Stroke Sister   . Cancer Sister     breast  . Heart attack Brother   . Cancer Brother     skin  . Skin cancer Brother   . Cancer Sister     colon    History  Substance Use Topics  . Smoking status: Never Smoker   . Smokeless tobacco: Not on file  . Alcohol Use: No    OB History    Grav Para Term Preterm Abortions TAB SAB Ect Mult Living                  Review of Systems  See History of Present Illness; otherwise all other systems are reviewed and negative  Allergies  Sulfa antibiotics  Home Medications   Current Outpatient Rx  Name  Route   Sig  Dispense  Refill  . CALCIUM CARBONATE-VITAMIN D 600-400 MG-UNIT PO TABS   Oral   Take 2 tablets by mouth daily.         Marland Kitchen VITAMIN D3 2000 UNITS PO TABS   Oral   Take 2,000 mg by mouth daily.         Marland Kitchen DOCUSATE SODIUM 100 MG PO CAPS   Oral   Take 100 mg by mouth every morning.         Marland Kitchen FERROUS SULFATE 325 (65 FE) MG PO TABS   Oral   Take 325 mg by mouth daily with breakfast.         . OMEGA-3 FATTY ACIDS 1000 MG PO CAPS   Oral   Take 1 g by mouth 2 (two) times daily.          . FUROSEMIDE 20 MG PO TABS   Oral   Take 20 mg by mouth daily as needed. For edema         . IBUPROFEN 200 MG PO TABS   Oral   Take 400 mg by mouth every 6 (six) hours as needed. For headache.         Marland Kitchen LETROZOLE  2.5 MG PO TABS   Oral   Take 2.5 mg by mouth every morning.          Marland Kitchen MELOXICAM 7.5 MG PO TABS   Oral   Take 7.5 mg by mouth every morning.          Marland Kitchen METFORMIN HCL 500 MG PO TABS   Oral   Take 500 mg by mouth 2 (two) times daily with a meal.           . SIMVASTATIN 40 MG PO TABS   Oral   Take 40 mg by mouth at bedtime.           Marland Kitchen CIPROFLOXACIN HCL 500 MG PO TABS   Oral   Take 1 tablet (500 mg total) by mouth every 12 (twelve) hours.   10 tablet   0   . ONDANSETRON HCL 4 MG PO TABS   Oral   Take 1 tablet (4 mg total) by mouth every 6 (six) hours.   12 tablet   0   . OXYCODONE-ACETAMINOPHEN 5-325 MG PO TABS   Oral   Take 1 tablet by mouth every 4 (four) hours as needed for pain.   20 tablet   0     BP 139/58  Pulse 82  Temp 97.8 F (36.6 C) (Oral)  Resp 18  Ht 5\' 3"  (1.6 m)  Wt 230 lb (104.327 kg)  BMI 40.74 kg/m2  SpO2 95%  Physical Exam  Nursing note and vitals reviewed. Constitutional: She is oriented to person, place, and time. She appears well-developed and well-nourished.       Morbidly obese female in mild distress appears uncomfortable  HENT:  Head: Normocephalic and atraumatic.  Right Ear: External ear normal.  Left  Ear: External ear normal.  Nose: Nose normal.  Mouth/Throat: Oropharynx is clear and moist.  Eyes: Conjunctivae normal and EOM are normal. Pupils are equal, round, and reactive to light.  Neck: Normal range of motion. Neck supple. No JVD present. No tracheal deviation present. No thyromegaly present.  Cardiovascular: Normal rate, regular rhythm, normal heart sounds and intact distal pulses.  Exam reveals no gallop and no friction rub.   No murmur heard. Pulmonary/Chest: Effort normal and breath sounds normal. No stridor. No respiratory distress. She has no wheezes. She has no rales. She exhibits no tenderness.  Abdominal: Soft. Bowel sounds are normal. She exhibits no distension and no mass. There is tenderness (diffuse tenderness to the abdomen. No rebound or guarding. Abdomen is slightly distended but not rigid or firm. Bowel sounds normal). There is no rebound and no guarding.  Musculoskeletal: Normal range of motion. She exhibits no edema and no tenderness.  Lymphadenopathy:    She has no cervical adenopathy.  Neurological: She is alert and oriented to person, place, and time. No cranial nerve deficit. She exhibits normal muscle tone. Coordination normal.  Skin: Skin is warm and dry. No rash noted. No erythema. No pallor.  Psychiatric: She has a normal mood and affect. Her behavior is normal. Judgment and thought content normal.    ED Course  Procedures (including critical care time)  Labs Reviewed  CBC WITH DIFFERENTIAL - Abnormal; Notable for the following:    Neutrophils Relative 82 (*)     Lymphocytes Relative 10 (*)     All other components within normal limits  COMPREHENSIVE METABOLIC PANEL - Abnormal; Notable for the following:    Glucose, Bld 248 (*)     Creatinine, Ser 0.43 (*)  All other components within normal limits  URINALYSIS, ROUTINE W REFLEX MICROSCOPIC - Abnormal; Notable for the following:    Ketones, ur 15 (*)     Leukocytes, UA MODERATE (*)     All other  components within normal limits  URINE MICROSCOPIC-ADD ON  URINE CULTURE   Ct Abdomen Pelvis W Contrast  05/15/2012  *RADIOLOGY REPORT*  Clinical Data: Abdominal pain and vomiting.  History of bilateral breast cancer, and ovarian and endometrial cancer.  CT ABDOMEN AND PELVIS WITH CONTRAST  Technique:  Multidetector CT imaging of the abdomen and pelvis was performed following the standard protocol during bolus administration of intravenous contrast.  Contrast: OMNIPAQUE IOHEXOL 300 MG/ML  SOLN  Comparison: CT of the abdomen and pelvis performed 01/21/2012  Findings: Pleuroparenchymal scarring is again noted at the left lung base, with nodular scarring or rounded atelectasis, and associated trace complex fluid.  There is diffuse fatty infiltration within the liver, with mild sparing about the gallbladder fossa.  The liver is otherwise unremarkable in appearance.  The spleen is within normal limits. 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  The gallbladder is unremarkable in appearance.  The pancreas and adrenal glands are within normal limits.  Tiny hypodensities within the kidneys likely reflect small cysts. The kidneys are otherwise unremarkable in appearance.  There is no evidence of hydronephrosis.  No renal or ureteral stones are seen. No significant perinephric stranding is appreciated.  There is herniation of a short segment of small bowel to the right of the umbilicus, as seen on the prior study.  There is slight distension of adjacent small bowel loops, with mild free fluid, more prominent than on the prior study.  There is no evidence for significant obstruction.  Mild associated soft tissue inflammation is again seen.  The stomach is within normal limits.  No acute vascular abnormalities are seen.  The appendix is not definitely seen; there is no evidence for appendicitis.  Minimal diverticulosis is noted along the descending and  proximal sigmoid colon; the colon is otherwise unremarkable in appearance.  The bladder is moderately distended and grossly unremarkable in appearance.  The patient is status post hysterectomy.  No suspicious adnexal masses are seen.  Small bilateral inguinal hernias are noted, containing only fat.  No inguinal lymphadenopathy is seen.  No acute osseous abnormalities are identified.  Intervertebral disc space narrowing is noted at L1-L2 and L5-S1, with associated sclerotic change.  IMPRESSION:  1.  Herniation of a short segment of small bowel to the right of umbilicus, as on the prior study.  Slight distension of adjacent small bowel loops now demonstrates mild associated free fluid, new from the prior study.  This may reflect mild bowel inflammation secondary to the hernia, without definite evidence for obstruction. 3.  Minimal diverticulosis along the descending and proximal sigmoid colon; colon otherwise unremarkable in appearance. 4.  Diffuse fatty infiltration within the liver. 5.  Likely tiny bilateral renal cysts seen. 6.  Small bilateral inguinal hernias, containing only fat. 7.  Chronic pleural and parenchymal changes at the left lung base, with associated trace complex fluid.  This is stable from prior studies.   Original Report Authenticated By: Tonia Ghent, M.D.    Dg Abd Acute W/chest  05/15/2012  *RADIOLOGY REPORT*  Clinical Data: Nausea and vomiting; upper abdominal pain.  ACUTE ABDOMEN SERIES (ABDOMEN 2 VIEW & CHEST 1 VIEW)  Comparison: Chest radiograph performed 12/09/2011, and abdominal radiograph performed 01/23/2012  Findings: The lungs are well-aerated.  A complex area of fluid and scarring is noted at the left lung base, as seen on prior studies. There is no evidence of pneumothorax.  The cardiomediastinal silhouette is borderline normal in size.  Clips are seen overlying both axilla.  The visualized bowel gas pattern is unremarkable.  Scattered stool and air are seen within the colon;  there is no evidence of small bowel dilatation to suggest obstruction.  No free intra-abdominal air is identified on the provided upright view.  No acute osseous abnormalities are seen; the sacroiliac joints are unremarkable in appearance.  IMPRESSION:  1.  Unremarkable bowel gas pattern; no free intra-abdominal air seen. 2.  Complex area of fluid and scarring at the left lung base, as seen on prior studies.  Lungs otherwise clear.   Original Report Authenticated By: Tonia Ghent, M.D.      1. Abdominal hernia   2. Abdominal pain   3. Vomiting   4. Urinary tract infection       MDM  67 year old female with nausea and vomiting. Patient has history of small bowel obstruction secondary to a hernia. We'll get CT abdomen pelvis.  CT abdomen pelvis without signs of obstruction. She does have some soft tissue swelling and stranding around a loop of bowel in her hernia. She does have followup with surgery planned for hernia repair on the 14th. Discuss case briefly with Dr. Derrell Lolling, her surgeon. If patient has further nausea vomiting or bone pain she is return to the emergency room for potential admission. Patient is comfortable for discharge home. She understands she is to stick to a bland diet until she is feeling better. She was noted to have a mild urinary tract infection, and will treat for this       Olivia Mackie, MD 05/16/12 [phone]

## 2012-05-22 ENCOUNTER — Encounter (HOSPITAL_COMMUNITY)
Admission: RE | Admit: 2012-05-22 | Discharge: 2012-05-22 | Disposition: A | Discharge status: Home / Self Care | Payer: Medicare Other | Source: Ambulatory Visit | Attending: General Surgery | Admitting: General Surgery

## 2012-05-22 ENCOUNTER — Encounter (HOSPITAL_COMMUNITY): Payer: Self-pay

## 2012-05-22 DIAGNOSIS — Z01818 Encounter for other preprocedural examination: Secondary | ICD-10-CM | POA: Diagnosis not present

## 2012-05-22 DIAGNOSIS — K430 Incisional hernia with obstruction, without gangrene: Secondary | ICD-10-CM | POA: Diagnosis not present

## 2012-05-22 DIAGNOSIS — J90 Pleural effusion, not elsewhere classified: Secondary | ICD-10-CM | POA: Diagnosis not present

## 2012-05-22 DIAGNOSIS — K660 Peritoneal adhesions (postprocedural) (postinfection): Secondary | ICD-10-CM | POA: Diagnosis not present

## 2012-05-22 DIAGNOSIS — E119 Type 2 diabetes mellitus without complications: Secondary | ICD-10-CM | POA: Diagnosis not present

## 2012-05-22 DIAGNOSIS — Z6841 Body Mass Index (BMI) 40.0 and over, adult: Secondary | ICD-10-CM | POA: Diagnosis not present

## 2012-05-22 LAB — SURGICAL PCR SCREEN
MRSA, PCR: NEGATIVE
Staphylococcus aureus: POSITIVE — AB

## 2012-05-22 LAB — CBC
HCT: 42.4 % (ref 36.0–46.0)
Hemoglobin: 14.1 g/dL (ref 12.0–15.0)
MCH: 29.1 pg (ref 26.0–34.0)
MCHC: 33.3 g/dL (ref 30.0–36.0)
MCV: 87.4 fL (ref 78.0–100.0)
Platelets: 173 10*3/uL (ref 150–400)
RBC: 4.85 MIL/uL (ref 3.87–5.11)
RDW: 13.1 % (ref 11.5–15.5)
WBC: 6.6 10*3/uL (ref 4.0–10.5)

## 2012-05-22 LAB — BASIC METABOLIC PANEL
BUN: 11 mg/dL (ref 6–23)
CO2: 30 mEq/L (ref 19–32)
Calcium: 9.3 mg/dL (ref 8.4–10.5)
Chloride: 100 mEq/L (ref 96–112)
Creatinine, Ser: 0.43 mg/dL — ABNORMAL LOW (ref 0.50–1.10)
GFR calc Af Amer: >90 mL/min (ref >90)
GFR calc non Af Amer: >90 mL/min (ref >90)
Glucose, Bld: 155 mg/dL — ABNORMAL HIGH (ref 70–99)
Potassium: 3.7 mEq/L (ref 3.5–5.1)
Sodium: 140 mEq/L (ref 135–145)

## 2012-05-22 NOTE — Pre-Procedure Instructions (Signed)
20 Brittany Porter  05/22/2012   Your procedure is scheduled on:  January 13  Report to [facility] at 05:30 AM.  Call this number if you have problems the morning of surgery: [phone]   Remember:   Do not eat food or drink:After Midnight.  Take these medicines the morning of surgery with A SIP OF WATER: Cipro, Zofran, Oxycodone     STOP Calcium, Vitamin D, Fish Oil, Ibuprofen, Mobic after today  Do not wear jewelry, make-up or nail polish.  Do not wear lotions, powders, or perfumes. You may wear deodorant.  Do not shave 48 hours prior to surgery. Men may shave face and neck.  Do not bring valuables to the hospital.  Contacts, dentures or bridgework may not be worn into surgery.  Leave suitcase in the car. After surgery it may be brought to your room.  For patients admitted to the hospital, checkout time is 11:00 AM the day of discharge.   Patients discharged the day of surgery will not be allowed to drive home.  Name and phone number of your driver: Family/ Friend  Special Instructions: Shower using CHG 2 nights before surgery and the night before surgery.  If you shower the day of surgery use CHG.  Use special wash - you have one bottle of CHG for all showers.  You should use approximately 1/3 of the bottle for each shower.   Please read over the following fact sheets that you were given: Pain Booklet, Coughing and Deep Breathing and Surgical Site Infection Prevention

## 2012-05-23 ENCOUNTER — Telehealth (INDEPENDENT_AMBULATORY_CARE_PROVIDER_SITE_OTHER): Payer: Self-pay

## 2012-05-23 NOTE — Telephone Encounter (Signed)
The pt called to see what medications to stop preop.  The preadmissions nurse told her to stop her Mobic, Fish Oil and Vitamins.  She wanted to know if she needs to stop her Metformin and Letrozole.  I told her no.

## 2012-05-25 NOTE — H&P (Signed)
Brittany Porter     MRN: [id number]   Description: 67 year old female  Provider: Ernestene Mention, MD  Department: [facility]        Diagnoses     Incisional hernia with obstruction   - Primary    552.21         Vitals - Last Recorded     BP Pulse Temp Resp Ht Wt    140/86 76 97.2 F (36.2 C) 18 5' 3.5" (1.613 m) 233 lb 9.6 oz (105.96 kg)    BMI - 40.73 kg/m2                 History and Physical    Ernestene Mention, MD   Patient ID: Brittany Porter, female   DOB: 08-23-1945, 67 y.o.   MRN: [id number]             HPI [name] is a 67 y.o. female.  She returns for further discussion about management of her ventral incisional hernia.   She has seen Dr. Drue Second who feels that all of her malignancies are under control and that it is appropriate from her point of view to proceed with the hernia repair.   This patient has a complex medical history. She underwent bilateral modified radical mastectomy by Dr. Rolene Course many years ago. 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 Edwyna Shell in 2012. That has resolved and she is on antiestrogen therapy. I do not know if she has any other metastatic disease identified. 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 I performed wide excision and sentinel node biopsy 2 years ago. There is no known recurrence to date. In addition she has a history of ovarian and endometrial cancer, treated and followed by Dr. [name] Schimke. She's had abdominal hysterectomy, bilateral oophorectomy, omentectomy, and postop perineal radiation therapy in June of 2010. I am not aware of any recurrence of that cancer.    In early September of this year she was admitted to the hospital with a small bowel obstruction. She required a nasogastric tube for 3 days and then the bowel obstruction resolved. She has felt fine since then has no pain tolerating diet normal bowel function. She was  unaware that she had a hernia as her symptoms were diffuse pain, not focal pain. She was seen by Dr. Harden Mo and Dr. Jimmye Norman in the hospital. A CT scan was performed on 01/21/2012 shows a ventral incisional hernia near the umbilicus. This contains a loop of bowel and possibly a source of obstruction. She had a fatty liver. There is scarring in her lung bases. There was no abdominal mass or sign of cancer.   I had a long discussion with the patient and her husband today about repairing her ventral hernia. I basically offered to do this and she stated that she wanted to have it repaired. She is aware that we will attempt to do this laparoscopically but she is at high risk for conversion to open surgery, high risk for wound complications, high risk for recurrence. She is aware that she is at high risk for severe adhesions from her previous cancer surgery.       Past Medical History   Diagnosis  Date   .  Pleural effusion  04/2010   .  Breast cancer     .  Ovarian cancer     .  Endometrial  carcinoma  10/2008   .  Iron deficiency     .  Diabetes mellitus     .  Hyperlipidemia     .  Fluid overload     .  Arthritis         Past Surgical History   Procedure  Date   .  Mastectomy  1997       Bilateral   .  Abdominal hysterectomy  10/2008   .  Melanoma removal  02/2009, 07/2009   .  Knee arthroscopy  04/2010       Right knee   .  Breast surgery  04/04/96       bil masty       Family History   Problem  Relation  Age of Onset   .  Stroke  Mother     .  Cancer  Father     .  Colon cancer  Sister     .  Stroke  Sister     .  Cancer  Sister         breast   .  Heart attack  Brother     .  Cancer  Brother         skin   .  Skin cancer  Brother     .  Cancer  Sister         colon      Social History History   Substance Use Topics   .  Smoking status:  Never Smoker    .  Smokeless tobacco:  Not on file   .  Alcohol Use:  No       Allergies   Allergen  Reactions   .   Sulfa Antibiotics         "Eyes swelled shut"       Current Outpatient Prescriptions   Medication  Sig  Dispense  Refill   .  Calcium Carbonate-Vitamin D (CALCIUM 600+D) 600-400 MG-UNIT per tablet  Take 2 tablets by mouth daily.         .  Cholecalciferol (VITAMIN D3) 2000 UNITS TABS  Take 2,000 mg by mouth daily.         Tery Sanfilippo Calcium (STOOL SOFTENER PO)  Take by mouth.         .  ferrous sulfate 325 (65 FE) MG tablet  Take 325 mg by mouth daily with breakfast.           .  fish oil-omega-3 fatty acids 1000 MG capsule  Take 2 g by mouth daily.           .  furosemide (LASIX) 20 MG tablet  Take 20 mg by mouth daily.          Marland Kitchen  letrozole (FEMARA) 2.5 MG tablet  Take 2.5 mg by mouth daily.           .  meloxicam (MOBIC) 7.5 MG tablet  Take 7.5 mg by mouth 2 (two) times daily.          .  metFORMIN (GLUCOPHAGE) 500 MG tablet  Take 500 mg by mouth 2 (two) times daily with a meal.           .  nystatin (MYCOSTATIN) powder  Apply topically 4 (four) times daily.   15 g   3   .  prochlorperazine (COMPAZINE) 10 MG tablet  Take 10 mg by mouth as needed. For nausea         .  simvastatin (ZOCOR) 40 MG tablet  Take 40 mg by mouth at bedtime.              Review of Systems   Constitutional: Negative for fever, chills and unexpected weight change.  HENT: Negative for hearing loss, congestion, sore throat, trouble swallowing and voice change.   Eyes: Negative for visual disturbance.  Respiratory: Negative for cough and wheezing.   Cardiovascular: Negative for chest pain, palpitations and leg swelling.  Gastrointestinal: Negative for nausea, vomiting, abdominal pain, diarrhea, constipation, blood in stool, abdominal distention and anal bleeding.  Genitourinary: Negative for hematuria, vaginal bleeding and difficulty urinating.  Musculoskeletal: Negative for arthralgias.  Skin: Negative for rash and wound.  Neurological: Negative for seizures, syncope and headaches.  Hematological: Negative  for adenopathy. Does not bruise/bleed easily.  Psychiatric/Behavioral: Negative for confusion.    Blood pressure 140/86, pulse 76, temperature 97.2 F (36.2 C), resp. rate 18, height 5' 3.5" (1.613 m), weight 233 lb 9.6 oz (105.96 kg).   Physical Exam  Constitutional: She is oriented to person, place, and time. She appears well-developed and well-nourished. No distress.       Weight 233  HENT:   Head: Normocephalic and atraumatic.   Nose: Nose normal.   Mouth/Throat: No oropharyngeal exudate.  Eyes: Conjunctivae normal and EOM are normal. Pupils are equal, round, and reactive to light. Left eye exhibits no discharge. No scleral icterus.  Neck: Neck supple. No JVD present. No tracheal deviation present. No thyromegaly present.  Cardiovascular: Normal rate, regular rhythm, normal heart sounds and intact distal pulses.    No murmur heard. Pulmonary/Chest: Effort normal and breath sounds normal. No respiratory distress. She has no wheezes. She has no rales. She exhibits no tenderness.  Abdominal: Soft. Bowel sounds are normal. She exhibits no distension and no mass. There is no tenderness. There is no rebound and no guarding.       Morbidly obese with a panniculus present. Midline incision starting above the umbilicus and extending below the umbilicus. There is some fullness around the umbilicus. She has a known hernia from CT scan. Not distended. Nontender.  Musculoskeletal: She exhibits no edema and no tenderness.  Lymphadenopathy:    She has no cervical adenopathy.  Neurological: She is alert and oriented to person, place, and time. She exhibits normal muscle tone. Coordination normal.  Skin: Skin is warm. No rash noted. She is not diaphoretic. No erythema. No pallor.       Well-healed scar on the upper mid back. No sign of recurrent melanoma.  Psychiatric: She has a normal mood and affect. Her behavior is normal. Judgment and thought content normal.    Data Reviewed CT. Notes from  cancer center.   Assessment Ventral incisional hernia containing a loop of bowel. Will schedule for laparoscopic repair with mesh, high probability of converting to open.   Recent hospitalization for partial small bowel obstruction, resolved and asymptomatic. The hernia may have caused this.   History of breast cancer   History of malignant metastatic pleural effusion Treated with chest tube and anti- estrogen therapy   History malignant melanoma of back, no known recurrence   History malignant ovarian or endometrial cancer, status post TAH, BSO, omentectomy and postop perineal radiation. No known recurrence   Morbid obesity   Diabetes mellitus   Hyperlipidemia   Plan Scheduled for laparoscopic repair of ventral incisional hernia with mesh, possible open   I discussed the indications, details, techniques, and numerous risks with the patient and her  husband. There were that she is a high-risk patient. Their questions rancher. They agree with this plan.       Angelia Mould. Derrell Lolling, M.D., [facility] General and Minimally invasive Surgery Breast and Colorectal Surgery Office:   [phone] Pager:   [phone]

## 2012-05-27 MED ORDER — DEXTROSE 5 % IV SOLN
3.0000 g | INTRAVENOUS | Status: DC
  Filled 2012-05-27: qty 3000

## 2012-05-28 ENCOUNTER — Ambulatory Visit (HOSPITAL_COMMUNITY): Payer: Medicare Other | Admitting: *Deleted

## 2012-05-28 ENCOUNTER — Encounter (HOSPITAL_COMMUNITY): Payer: Self-pay | Admitting: *Deleted

## 2012-05-28 ENCOUNTER — Inpatient Hospital Stay (HOSPITAL_COMMUNITY)
Admission: RE | Admit: 2012-05-28 | Discharge: 2012-05-30 | DRG: 336 | Disposition: A | Discharge status: Home / Self Care | Payer: Medicare Other | Source: Ambulatory Visit | Attending: General Surgery | Admitting: General Surgery

## 2012-05-28 ENCOUNTER — Encounter (HOSPITAL_COMMUNITY)
Admission: RE | Disposition: A | Discharge status: Home / Self Care | Payer: Self-pay | Source: Ambulatory Visit | Attending: General Surgery

## 2012-05-28 ENCOUNTER — Encounter (HOSPITAL_COMMUNITY): Payer: Self-pay | Admitting: General Practice

## 2012-05-28 DIAGNOSIS — K660 Peritoneal adhesions (postprocedural) (postinfection): Secondary | ICD-10-CM

## 2012-05-28 DIAGNOSIS — Z9010 Acquired absence of unspecified breast and nipple: Secondary | ICD-10-CM

## 2012-05-28 DIAGNOSIS — K439 Ventral hernia without obstruction or gangrene: Secondary | ICD-10-CM | POA: Diagnosis not present

## 2012-05-28 DIAGNOSIS — E119 Type 2 diabetes mellitus without complications: Secondary | ICD-10-CM | POA: Diagnosis not present

## 2012-05-28 DIAGNOSIS — Z8543 Personal history of malignant neoplasm of ovary: Secondary | ICD-10-CM

## 2012-05-28 DIAGNOSIS — Z6841 Body Mass Index (BMI) 40.0 and over, adult: Secondary | ICD-10-CM | POA: Diagnosis not present

## 2012-05-28 DIAGNOSIS — Z01812 Encounter for preprocedural laboratory examination: Secondary | ICD-10-CM

## 2012-05-28 DIAGNOSIS — K430 Incisional hernia with obstruction, without gangrene: Secondary | ICD-10-CM | POA: Diagnosis not present

## 2012-05-28 DIAGNOSIS — C55 Malignant neoplasm of uterus, part unspecified: Secondary | ICD-10-CM

## 2012-05-28 DIAGNOSIS — C50919 Malignant neoplasm of unspecified site of unspecified female breast: Secondary | ICD-10-CM | POA: Diagnosis not present

## 2012-05-28 DIAGNOSIS — Z79899 Other long term (current) drug therapy: Secondary | ICD-10-CM

## 2012-05-28 DIAGNOSIS — C569 Malignant neoplasm of unspecified ovary: Secondary | ICD-10-CM

## 2012-05-28 DIAGNOSIS — Z8542 Personal history of malignant neoplasm of other parts of uterus: Secondary | ICD-10-CM

## 2012-05-28 DIAGNOSIS — Z882 Allergy status to sulfonamides status: Secondary | ICD-10-CM

## 2012-05-28 DIAGNOSIS — C4359 Malignant melanoma of other part of trunk: Secondary | ICD-10-CM

## 2012-05-28 HISTORY — PX: VENTRAL HERNIA REPAIR: SHX424

## 2012-05-28 HISTORY — PX: INSERTION OF MESH: SHX5868

## 2012-05-28 HISTORY — PX: LAPAROSCOPIC LYSIS OF ADHESIONS: SHX5905

## 2012-05-28 LAB — GLUCOSE, CAPILLARY
Glucose-Capillary: 152 mg/dL — ABNORMAL HIGH (ref 70–99)
Glucose-Capillary: 170 mg/dL — ABNORMAL HIGH (ref 70–99)
Glucose-Capillary: 187 mg/dL — ABNORMAL HIGH (ref 70–99)

## 2012-05-28 SURGERY — REPAIR, HERNIA, VENTRAL, LAPAROSCOPIC
Anesthesia: General | Site: Abdomen | Wound class: Clean Contaminated

## 2012-05-28 MED ORDER — DEXTROSE 5 % IV SOLN
3.0000 g | INTRAVENOUS | Status: DC | PRN
  Administered 2012-05-28: 3 g via INTRAVENOUS

## 2012-05-28 MED ORDER — METFORMIN HCL 500 MG PO TABS
500.0000 mg | ORAL_TABLET | Freq: Two times a day (BID) | ORAL | Status: DC
  Administered 2012-05-28 – 2012-05-30 (×4): 500 mg via ORAL
  Filled 2012-05-28 (×6): qty 1

## 2012-05-28 MED ORDER — MELOXICAM 7.5 MG PO TABS
7.5000 mg | ORAL_TABLET | Freq: Every morning | ORAL | Status: DC
Start: 2012-05-29 — End: 2012-05-30
  Administered 2012-05-29 – 2012-05-30 (×2): 7.5 mg via ORAL
  Filled 2012-05-28 (×2): qty 1

## 2012-05-28 MED ORDER — MIDAZOLAM HCL 5 MG/5ML IJ SOLN
INTRAMUSCULAR | Status: DC | PRN
  Administered 2012-05-28: 1 mg via INTRAVENOUS

## 2012-05-28 MED ORDER — MORPHINE SULFATE 2 MG/ML IJ SOLN
2.0000 mg | INTRAMUSCULAR | Status: DC | PRN
  Administered 2012-05-29: 2 mg via INTRAVENOUS
  Filled 2012-05-28: qty 1

## 2012-05-28 MED ORDER — MORPHINE SULFATE 10 MG/ML IJ SOLN
INTRAMUSCULAR | Status: DC | PRN
  Administered 2012-05-28: 2.5 mg via INTRAVENOUS
  Administered 2012-05-28: 5 mg via INTRAVENOUS

## 2012-05-28 MED ORDER — CHLORHEXIDINE GLUCONATE CLOTH 2 % EX PADS
6.0000 | MEDICATED_PAD | Freq: Every day | CUTANEOUS | Status: DC
  Administered 2012-05-28 – 2012-05-30 (×3): 6 via TOPICAL

## 2012-05-28 MED ORDER — HYDROMORPHONE HCL PF 1 MG/ML IJ SOLN
INTRAMUSCULAR | Status: AC
  Filled 2012-05-28: qty 1

## 2012-05-28 MED ORDER — ONDANSETRON HCL 4 MG PO TABS: 4.0000 mg | ORAL_TABLET | Freq: Four times a day (QID) | ORAL | Status: DC | PRN

## 2012-05-28 MED ORDER — ARTIFICIAL TEARS OP OINT
TOPICAL_OINTMENT | OPHTHALMIC | Status: DC | PRN
  Administered 2012-05-28: 1 via OPHTHALMIC

## 2012-05-28 MED ORDER — ESMOLOL HCL 10 MG/ML IV SOLN
INTRAVENOUS | Status: DC | PRN
  Administered 2012-05-28: 20 mg via INTRAVENOUS
  Administered 2012-05-28: 10 mg via INTRAVENOUS

## 2012-05-28 MED ORDER — LACTATED RINGERS IV SOLN
INTRAVENOUS | Status: DC | PRN
  Administered 2012-05-28 (×2): via INTRAVENOUS

## 2012-05-28 MED ORDER — SIMVASTATIN 40 MG PO TABS
40.0000 mg | ORAL_TABLET | Freq: Every day | ORAL | Status: DC
  Administered 2012-05-28 – 2012-05-29 (×2): 40 mg via ORAL
  Filled 2012-05-28 (×3): qty 1

## 2012-05-28 MED ORDER — HEPARIN SODIUM (PORCINE) 5000 UNIT/ML IJ SOLN
INTRAMUSCULAR | Status: AC
  Administered 2012-05-28: 5000 [IU] via SUBCUTANEOUS
  Filled 2012-05-28: qty 1

## 2012-05-28 MED ORDER — HYDROMORPHONE HCL PF 1 MG/ML IJ SOLN
0.2500 mg | INTRAMUSCULAR | Status: DC | PRN
  Administered 2012-05-28 (×4): 0.5 mg via INTRAVENOUS

## 2012-05-28 MED ORDER — PROPOFOL 10 MG/ML IV BOLUS
INTRAVENOUS | Status: DC | PRN
  Administered 2012-05-28: 120 mg via INTRAVENOUS

## 2012-05-28 MED ORDER — NEOSTIGMINE METHYLSULFATE 1 MG/ML IJ SOLN
INTRAMUSCULAR | Status: DC | PRN
  Administered 2012-05-28: 4 mg via INTRAVENOUS

## 2012-05-28 MED ORDER — OXYCODONE HCL 5 MG PO TABS: 5.0000 mg | ORAL_TABLET | Freq: Once | ORAL | Status: DC | PRN

## 2012-05-28 MED ORDER — INSULIN ASPART 100 UNIT/ML ~~LOC~~ SOLN
0.0000 [IU] | Freq: Three times a day (TID) | SUBCUTANEOUS | Status: DC
  Administered 2012-05-28: 4 [IU] via SUBCUTANEOUS

## 2012-05-28 MED ORDER — INSULIN ASPART 100 UNIT/ML ~~LOC~~ SOLN: 0.0000 [IU] | Freq: Every day | SUBCUTANEOUS | Status: DC

## 2012-05-28 MED ORDER — POTASSIUM CHLORIDE IN NACL 20-0.9 MEQ/L-% IV SOLN
INTRAVENOUS | Status: DC
  Administered 2012-05-28: 1000 mL via INTRAVENOUS
  Administered 2012-05-30: 08:00:00 via INTRAVENOUS
  Filled 2012-05-28 (×6): qty 1000

## 2012-05-28 MED ORDER — DOCUSATE SODIUM 100 MG PO CAPS
100.0000 mg | ORAL_CAPSULE | Freq: Every morning | ORAL | Status: DC
  Administered 2012-05-29 – 2012-05-30 (×2): 100 mg via ORAL
  Filled 2012-05-28 (×2): qty 1

## 2012-05-28 MED ORDER — OXYCODONE HCL 5 MG/5ML PO SOLN: 5.0000 mg | Freq: Once | ORAL | Status: DC | PRN

## 2012-05-28 MED ORDER — ROCURONIUM BROMIDE 100 MG/10ML IV SOLN
INTRAVENOUS | Status: DC | PRN
  Administered 2012-05-28: 50 mg via INTRAVENOUS

## 2012-05-28 MED ORDER — BUPIVACAINE-EPINEPHRINE 0.25% -1:200000 IJ SOLN
INTRAMUSCULAR | Status: DC | PRN
  Administered 2012-05-28: 11 mL

## 2012-05-28 MED ORDER — CHLORHEXIDINE GLUCONATE 4 % EX LIQD: 1.0000 "application " | Freq: Once | CUTANEOUS | Status: DC

## 2012-05-28 MED ORDER — INSULIN ASPART 100 UNIT/ML ~~LOC~~ SOLN
6.0000 [IU] | Freq: Three times a day (TID) | SUBCUTANEOUS | Status: DC
  Administered 2012-05-28: 6 [IU] via SUBCUTANEOUS

## 2012-05-28 MED ORDER — BUPIVACAINE-EPINEPHRINE PF 0.25-1:200000 % IJ SOLN
INTRAMUSCULAR | Status: AC
  Filled 2012-05-28: qty 30

## 2012-05-28 MED ORDER — MORPHINE SULFATE 2 MG/ML IJ SOLN
INTRAMUSCULAR | Status: AC
  Administered 2012-05-28: 2 mg via INTRAVENOUS
  Filled 2012-05-28: qty 1

## 2012-05-28 MED ORDER — CIPROFLOXACIN HCL 500 MG PO TABS
500.0000 mg | ORAL_TABLET | Freq: Two times a day (BID) | ORAL | Status: DC
  Administered 2012-05-28 – 2012-05-30 (×4): 500 mg via ORAL
  Filled 2012-05-28 (×6): qty 1

## 2012-05-28 MED ORDER — ONDANSETRON HCL 4 MG/2ML IJ SOLN
INTRAMUSCULAR | Status: DC | PRN
  Administered 2012-05-28: 4 mg via INTRAVENOUS

## 2012-05-28 MED ORDER — HEPARIN SODIUM (PORCINE) 5000 UNIT/ML IJ SOLN
5000.0000 [IU] | Freq: Once | INTRAMUSCULAR | Status: AC
  Administered 2012-05-28: 5000 [IU] via SUBCUTANEOUS

## 2012-05-28 MED ORDER — OXYCODONE-ACETAMINOPHEN 5-325 MG PO TABS
1.0000 | ORAL_TABLET | ORAL | Status: DC | PRN
  Administered 2012-05-28 – 2012-05-30 (×5): 2 via ORAL
  Administered 2012-05-30: 1 via ORAL
  Filled 2012-05-28 (×3): qty 2
  Filled 2012-05-28: qty 1
  Filled 2012-05-28: qty 2

## 2012-05-28 MED ORDER — DEXTROSE 5 % IV SOLN
3.0000 g | Freq: Three times a day (TID) | INTRAVENOUS | Status: AC
  Administered 2012-05-28 – 2012-05-29 (×3): 3 g via INTRAVENOUS
  Filled 2012-05-28 (×4): qty 3000

## 2012-05-28 MED ORDER — SODIUM CHLORIDE 0.9 % IR SOLN
Status: DC | PRN
  Administered 2012-05-28: 1000 mL

## 2012-05-28 MED ORDER — HEPARIN SODIUM (PORCINE) 5000 UNIT/ML IJ SOLN
5000.0000 [IU] | Freq: Three times a day (TID) | INTRAMUSCULAR | Status: DC
  Administered 2012-05-29 – 2012-05-30 (×4): 5000 [IU] via SUBCUTANEOUS
  Filled 2012-05-28 (×7): qty 1

## 2012-05-28 MED ORDER — MUPIROCIN 2 % EX OINT
TOPICAL_OINTMENT | CUTANEOUS | Status: AC
  Filled 2012-05-28: qty 22

## 2012-05-28 MED ORDER — VECURONIUM BROMIDE 10 MG IV SOLR
INTRAVENOUS | Status: DC | PRN
  Administered 2012-05-28: 3 mg via INTRAVENOUS
  Administered 2012-05-28: 4 mg via INTRAVENOUS

## 2012-05-28 MED ORDER — ONDANSETRON HCL 4 MG/2ML IJ SOLN: 4.0000 mg | Freq: Four times a day (QID) | INTRAMUSCULAR | Status: DC | PRN

## 2012-05-28 MED ORDER — LETROZOLE 2.5 MG PO TABS
2.5000 mg | ORAL_TABLET | Freq: Every morning | ORAL | Status: DC
  Administered 2012-05-29 – 2012-05-30 (×2): 2.5 mg via ORAL
  Filled 2012-05-28 (×2): qty 1

## 2012-05-28 MED ORDER — MUPIROCIN 2 % EX OINT
1.0000 "application " | TOPICAL_OINTMENT | Freq: Two times a day (BID) | CUTANEOUS | Status: DC
  Administered 2012-05-28 – 2012-05-30 (×4): 1 via NASAL
  Filled 2012-05-28: qty 22

## 2012-05-28 MED ORDER — ONDANSETRON HCL 4 MG PO TABS
4.0000 mg | ORAL_TABLET | Freq: Four times a day (QID) | ORAL | Status: DC
  Administered 2012-05-28 – 2012-05-30 (×7): 4 mg via ORAL
  Filled 2012-05-28 (×11): qty 1

## 2012-05-28 MED ORDER — GLYCOPYRROLATE 0.2 MG/ML IJ SOLN
INTRAMUSCULAR | Status: DC | PRN
Start: 2012-05-28 — End: 2012-05-28
  Administered 2012-05-28: 0.6 mg via INTRAVENOUS

## 2012-05-28 MED ORDER — FENTANYL CITRATE 0.05 MG/ML IJ SOLN
INTRAMUSCULAR | Status: DC | PRN
  Administered 2012-05-28: 100 ug via INTRAVENOUS
  Administered 2012-05-28: 150 ug via INTRAVENOUS

## 2012-05-28 MED ORDER — LIDOCAINE HCL (CARDIAC) 20 MG/ML IV SOLN
INTRAVENOUS | Status: DC | PRN
  Administered 2012-05-28: 50 mg via INTRAVENOUS

## 2012-05-28 MED ORDER — OXYCODONE-ACETAMINOPHEN 5-325 MG PO TABS
ORAL_TABLET | ORAL | Status: AC
  Administered 2012-05-29: 2 via ORAL
  Filled 2012-05-28: qty 2

## 2012-05-28 SURGICAL SUPPLY — 47 items
ADH SKN CLS APL DERMABOND .7 (GAUZE/BANDAGES/DRESSINGS) ×4
CANISTER SUCTION 2500CC (MISCELLANEOUS) ×1 IMPLANT
CHLORAPREP W/TINT 26ML (MISCELLANEOUS) ×2 IMPLANT
CLOTH BEACON ORANGE TIMEOUT ST (SAFETY) ×2 IMPLANT
COVER SURGICAL LIGHT HANDLE (MISCELLANEOUS) ×2 IMPLANT
DERMABOND ADVANCED (GAUZE/BANDAGES/DRESSINGS) ×4
DERMABOND ADVANCED .7 DNX12 (GAUZE/BANDAGES/DRESSINGS) ×1 IMPLANT
DEVICE SECURE STRAP 25 ABSORB (INSTRUMENTS) ×2 IMPLANT
DEVICE TROCAR PUNCTURE CLOSURE (ENDOMECHANICALS) ×2 IMPLANT
DRAPE UTILITY 15X26 W/TAPE STR (DRAPE) ×4 IMPLANT
ELECT REM PT RETURN 9FT ADLT (ELECTROSURGICAL) ×2
ELECTRODE REM PT RTRN 9FT ADLT (ELECTROSURGICAL) ×1 IMPLANT
GLOVE BIO SURGEON STRL SZ 6.5 (GLOVE) ×2 IMPLANT
GLOVE BIOGEL PI IND STRL 6.5 (GLOVE) IMPLANT
GLOVE BIOGEL PI IND STRL 7.0 (GLOVE) IMPLANT
GLOVE BIOGEL PI IND STRL 7.5 (GLOVE) IMPLANT
GLOVE BIOGEL PI INDICATOR 6.5 (GLOVE) ×1
GLOVE BIOGEL PI INDICATOR 7.0 (GLOVE) ×3
GLOVE BIOGEL PI INDICATOR 7.5 (GLOVE) ×1
GLOVE ECLIPSE 7.5 STRL STRAW (GLOVE) ×1 IMPLANT
GLOVE EUDERMIC 7 POWDERFREE (GLOVE) ×2 IMPLANT
GLOVE SS BIOGEL STRL SZ 6.5 (GLOVE) IMPLANT
GLOVE SUPERSENSE BIOGEL SZ 6.5 (GLOVE) ×1
GOWN PREVENTION PLUS XLARGE (GOWN DISPOSABLE) ×3 IMPLANT
GOWN STRL NON-REIN LRG LVL3 (GOWN DISPOSABLE) ×4 IMPLANT
KIT BASIN OR (CUSTOM PROCEDURE TRAY) ×2 IMPLANT
KIT ROOM TURNOVER OR (KITS) ×2 IMPLANT
MARKER SKIN DUAL TIP RULER LAB (MISCELLANEOUS) ×2 IMPLANT
MESH PARIETEX 25X20 (Mesh General) ×1 IMPLANT
NDL SPNL 22GX3.5 QUINCKE BK (NEEDLE) ×1 IMPLANT
NEEDLE SPNL 22GX3.5 QUINCKE BK (NEEDLE) ×2 IMPLANT
NS IRRIG 1000ML POUR BTL (IV SOLUTION) ×2 IMPLANT
PAD ARMBOARD 7.5X6 YLW CONV (MISCELLANEOUS) ×4 IMPLANT
SCALPEL HARMONIC ACE (MISCELLANEOUS) ×1 IMPLANT
SCISSORS LAP 5X35 DISP (ENDOMECHANICALS) ×2 IMPLANT
SET IRRIG TUBING LAPAROSCOPIC (IRRIGATION / IRRIGATOR) ×1 IMPLANT
SLEEVE ENDOPATH XCEL 5M (ENDOMECHANICALS) ×5 IMPLANT
SPONGE GAUZE 4X4 12PLY (GAUZE/BANDAGES/DRESSINGS) ×1 IMPLANT
SUT MNCRL AB 4-0 PS2 18 (SUTURE) ×3 IMPLANT
SUT NOVA NAB DX-16 0-1 5-0 T12 (SUTURE) ×2 IMPLANT
TACKER 5MM HERNIA 3.5CML NAB (ENDOMECHANICALS) ×3 IMPLANT
TOWEL OR 17X24 6PK STRL BLUE (TOWEL DISPOSABLE) ×2 IMPLANT
TOWEL OR 17X26 10 PK STRL BLUE (TOWEL DISPOSABLE) ×2 IMPLANT
TRAY FOLEY CATH 14FRSI W/METER (CATHETERS) ×1 IMPLANT
TRAY LAPAROSCOPIC (CUSTOM PROCEDURE TRAY) ×2 IMPLANT
TROCAR XCEL NON-BLD 11X100MML (ENDOMECHANICALS) ×1 IMPLANT
TROCAR XCEL NON-BLD 5MMX100MML (ENDOMECHANICALS) ×2 IMPLANT

## 2012-05-28 NOTE — Anesthesia Preprocedure Evaluation (Addendum)
Anesthesia Evaluation  Patient identified by MRN, date of birth, ID band Patient awake    Reviewed: Allergy & Precautions, H&P , NPO status , Patient's Chart, lab work & pertinent test results  Airway Mallampati: II TM Distance: >3 FB Neck ROM: Full    Dental No notable dental hx. (+) Edentulous Upper, Edentulous Lower and Dental Advisory Given   Pulmonary neg pulmonary ROS,  breath sounds clear to auscultation  Pulmonary exam normal       Cardiovascular negative cardio ROS  Rhythm:Regular Rate:Normal     Neuro/Psych negative neurological ROS  negative psych ROS   GI/Hepatic negative GI ROS, Neg liver ROS,   Endo/Other  negative endocrine ROSdiabetes, Oral Hypoglycemic AgentsMorbid obesity  Renal/GU negative Renal ROS  negative genitourinary   Musculoskeletal  (+) Arthritis -,   Abdominal (+) + obese,   Peds  Hematology negative hematology ROS (+)   Anesthesia Other Findings   Reproductive/Obstetrics negative OB ROS                          Anesthesia Physical Anesthesia Plan  ASA: III  Anesthesia Plan: General   Post-op Pain Management:    Induction: Intravenous  Airway Management Planned: Oral ETT  Additional Equipment:   Intra-op Plan:   Post-operative Plan: Extubation in OR  Informed Consent: I have reviewed the patients History and Physical, chart, labs and discussed the procedure including the risks, benefits and alternatives for the proposed anesthesia with the patient or authorized representative who has indicated his/her understanding and acceptance.   Dental advisory given  Plan Discussed with: Anesthesiologist and Surgeon  Anesthesia Plan Comments:        Anesthesia Quick Evaluation

## 2012-05-28 NOTE — Op Note (Signed)
Patient Name:           Brittany Porter   Date of Surgery:        05/28/2012  Pre op Diagnosis:      Incarcerated ventral incisional hernia with obstruction  Post op Diagnosis:    Incarcerated ventral incisional hernia with obstruction, complex intra-abdominal adhesions  Procedure:                 Laparoscopic lysis of adhesions, complex, requiring 90 minutes, laparoscopic repair of ventral incisional hernia, implantation of mesh  Surgeon:                     Brittany Porter. Brittany Porter, M.D., FACS  Assistant:                      Brittany Porter, M.D., FACS  Operative Indications:   Brittany Porter is a 67 y.o. Female Who is admitted electively for repair of a ventral incisional hernia ventral incisional hernia.   This patient has a complex medical history. She underwent bilateral modified radical mastectomy by Dr. Rolene Porter many years ago. 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 Edwyna Porter in 2012. That has resolved and she is on antiestrogen therapy. I do not know if she has any other metastatic disease identified. 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 I performed wide excision and sentinel node biopsy 2 years ago. There is no known recurrence to date. In addition she has a history of ovarian and endometrial cancer, treated and followed by Dr. [name] Porter. She's had abdominal hysterectomy, bilateral oophorectomy, omentectomy, and postop perineal radiation therapy in June of 2010. I am not aware of any recurrence of that cancer.  In early September of this year she was admitted to the hospital with a small bowel obstruction. She required a nasogastric tube for 3 days and then the bowel obstruction resolved. She has felt fine since then has no pain tolerating diet normal bowel function. She was unaware that she had a hernia as her symptoms were diffuse pain, not focal pain. She was seen by Dr. Harden Porter and Dr. Jimmye Porter in the  hospital. A CT scan was performed on 01/21/2012 shows a ventral incisional hernia near the umbilicus. This contains a loop of bowel and possibly a source of obstruction. She had a fatty liver. There is scarring in her lung bases. There was no abdominal mass or sign of cancer. She has been evaluated recently by Dr. [name] Porter and Dr. Drue Porter who feel that her cancers are under control. I had a long discussion with the patient and her husband  about repairing her ventral hernia. I basically offered to do this and she stated that she wanted to have it repaired. She is aware that we will attempt to do this laparoscopically but she is at high risk for conversion to open surgery, high risk for wound    Operative Findings:       The patient had complex intra-abdominal adhesions. The omentum was surgically absent.There was a central hernia defect around the umbilicus being approximately 4-5 cm in diameter and two  other smaller defects adjacent to that, also associated with her midline incision. There were numerous monofilament permanent sutures caught up in the hernia defect in the adhesions around the defect.There were no other hernias noted. There were numerous loops of small bowel adherent to the anterior  abdominal wall and a couple of loops incarcerated up in the incisional hernia. There was no sign of any intra-abdominal malignancy. There was no ascites. I was able to slowly take the adhesions down but this was very tedious and required more than 90 minutes of time. Examination of the small bowel at numerous times during the case and at the completion of the dissection showed no evidence of any significant injury.  Procedure in Detail:          Following the induction of general endotracheal anesthesia, a Foley catheter was placed, the abdomen was prepped and draped in a sterile fashion, and intravenous antibiotics were given. The surgical time out was performed. 0.5% Marcaine with epinephrine was used  as local infiltration anesthetic. I placed threee 5 mm trocars in the left lateral abdomen, the first being an optical trocar in the left subcostal region. Entry was uneventful and pneumoperitoneum was created. Once the videocamera was inserted we could visualize numerous adhesions to the anterior midline, but up above the falciform ligament there were no adhesions and  in the deep pelvis there were no adhesions. We ultimately I placed 3 more 5 mm trocars,  2 on the right side and one in the epigastrium. We then slowly took all of the adhesions down. This was done with cold scissors and in some areas I could sweep the adhesions as they were relatively soft;   but in other areas, especially around the hernia defects, adhesions were more dense requiring very slow dissection. I was able to see the dissection planes fairly well throughout the dissection there were just numerous adhesions and it took a long time. After we took all the adhesions down the bowel was completely separated from the  abdominal wall and we could visualize the hernia defect. Using a spinal needle and a marking pen I marked out the hernia defects. I found that it by using 20 cm transversely by 25 cm vertically mesh I get a 5-7 cm overlap in all areas. I brought a 20 cm x 25 cm piece of Parietex composite(PCO)  mesh to the operative field. I marked 8 equidistant suture fixation sites on the mesh and on the abdominal wall. I placed 8  #1 Novafil sutures being careful to place the sutures so the knots were tied on the rough side. I then moistend the mesh and inserted it through a 12 mm trocar in the left flank. The mesh was positioned.   I was careful to position the mesh so that the rough side was against the abdominal wall, and the smooth, nonadhesive side was toward the intestine.  At each of the planned suture fixation sites I made a small puncture wound and using an Endo Close device drew  the Novafil sutures up through the abdominal wall. I was  careful to take a 1 cm bite of tissue. After all of these were placed I lifted up the sutures in the mesh was positioned and deployed nicely with very little redundancy but no tension on the sutures. I tied all 8 sutures. This looked good. I then further secured the mesh with a 5 mm ProTacker. I used about 80 of these tacks in a double crown technique. We were very careful to place the tacks no further apart than 1 cm at the edge and an inner crown was also placed. This was inspected several times from the right side and the left side we found. There was no bleeding. The small bowel was inspected  one more time and do not appear to be any injury. The trocars were removed. The pneumoperitoneum was released. All the skin incisions were closed with subcuticular sutures of 4-0 Monocryl and Dermabond. The Velcro binder was placed and the patient taken to recovery was stable condition.  EBL 20 cc. Counts correct. Complications none.     Brittany Porter. Brittany Porter, M.D., FACS General and Minimally Invasive Surgery Breast and Colorectal Surgery  05/28/2012 10:22 AM

## 2012-05-28 NOTE — Transfer of Care (Signed)
Immediate Anesthesia Transfer of Care Note  Patient: Brittany Porter  Procedure(s) Performed: Procedure(s) (LRB) with comments: LAPAROSCOPIC VENTRAL HERNIA (N/A) INSERTION OF MESH (N/A) LAPAROSCOPIC LYSIS OF ADHESIONS (N/A)  Patient Location: PACU  Anesthesia Type:General  Level of Consciousness: awake, oriented and patient cooperative  Airway & Oxygen Therapy: Patient Spontanous Breathing and Patient connected to face mask oxygen  Post-op Assessment: Report given to PACU RN, Post -op Vital signs reviewed and stable and Patient moving all extremities  Post vital signs: Reviewed and stable  Complications: No apparent anesthesia complications

## 2012-05-28 NOTE — Preoperative (Signed)
Beta Blockers   Reason not to administer Beta Blockers:Not Applicable 

## 2012-05-28 NOTE — Anesthesia Postprocedure Evaluation (Signed)
  Anesthesia Post-op Note  Patient: Brittany Porter  Procedure(s) Performed: Procedure(s) (LRB) with comments: LAPAROSCOPIC VENTRAL HERNIA (N/A) INSERTION OF MESH (N/A) LAPAROSCOPIC LYSIS OF ADHESIONS (N/A)  Patient Location: PACU  Anesthesia Type:General  Level of Consciousness: awake and alert   Airway and Oxygen Therapy: Patient Spontanous Breathing and Patient connected to nasal cannula oxygen  Post-op Pain: mild  Post-op Assessment: Post-op Vital signs reviewed, Patient's Cardiovascular Status Stable, Respiratory Function Stable, Patent Airway and No signs of Nausea or vomiting  Post-op Vital Signs: Reviewed and stable  Complications: No apparent anesthesia complications

## 2012-05-28 NOTE — OR Nursing (Signed)
Off monitor x for pulse ox / OOB top recliner and continues to tolerate all well

## 2012-05-28 NOTE — Interval H&P Note (Signed)
History and Physical Interval Note:  05/28/2012 7:04 AM  Brittany Porter  has presented today for surgery, with the diagnosis of ventral hernia  The goals and the various methods of treatment have been discussed with the patient and family. After consideration of risks, benefits and other options for treatment, the patient has consented to  Procedure(s) (LRB) with comments: LAPAROSCOPIC VENTRAL HERNIA (N/A) - laparoscopic ventral hernia repair with mesh, possible open INSERTION OF MESH (N/A) as a surgical intervention .  The patient's history has been reviewed, patient examined today, no change in status, stable for surgery.  I have reviewed the patient's chart and labs.  Questions were answered to the patient's satisfaction.     Ernestene Mention

## 2012-05-29 ENCOUNTER — Encounter (HOSPITAL_COMMUNITY): Payer: Self-pay | Admitting: General Surgery

## 2012-05-29 DIAGNOSIS — Z6841 Body Mass Index (BMI) 40.0 and over, adult: Secondary | ICD-10-CM | POA: Diagnosis not present

## 2012-05-29 DIAGNOSIS — Z8542 Personal history of malignant neoplasm of other parts of uterus: Secondary | ICD-10-CM | POA: Diagnosis not present

## 2012-05-29 DIAGNOSIS — Z882 Allergy status to sulfonamides status: Secondary | ICD-10-CM | POA: Diagnosis not present

## 2012-05-29 DIAGNOSIS — Z79899 Other long term (current) drug therapy: Secondary | ICD-10-CM | POA: Diagnosis not present

## 2012-05-29 DIAGNOSIS — K430 Incisional hernia with obstruction, without gangrene: Secondary | ICD-10-CM | POA: Diagnosis present

## 2012-05-29 DIAGNOSIS — Z8543 Personal history of malignant neoplasm of ovary: Secondary | ICD-10-CM | POA: Diagnosis not present

## 2012-05-29 DIAGNOSIS — Z9010 Acquired absence of unspecified breast and nipple: Secondary | ICD-10-CM | POA: Diagnosis not present

## 2012-05-29 DIAGNOSIS — E119 Type 2 diabetes mellitus without complications: Secondary | ICD-10-CM | POA: Diagnosis present

## 2012-05-29 DIAGNOSIS — Z01812 Encounter for preprocedural laboratory examination: Secondary | ICD-10-CM | POA: Diagnosis not present

## 2012-05-29 DIAGNOSIS — K660 Peritoneal adhesions (postprocedural) (postinfection): Secondary | ICD-10-CM | POA: Diagnosis present

## 2012-05-29 DIAGNOSIS — C50919 Malignant neoplasm of unspecified site of unspecified female breast: Secondary | ICD-10-CM | POA: Diagnosis present

## 2012-05-29 LAB — BASIC METABOLIC PANEL
BUN: 7 mg/dL (ref 6–23)
CO2: 26 mEq/L (ref 19–32)
Calcium: 8.1 mg/dL — ABNORMAL LOW (ref 8.4–10.5)
Chloride: 101 mEq/L (ref 96–112)
Creatinine, Ser: 0.56 mg/dL (ref 0.50–1.10)
GFR calc Af Amer: >90 mL/min (ref >90)
GFR calc non Af Amer: >90 mL/min (ref >90)
Glucose, Bld: 173 mg/dL — ABNORMAL HIGH (ref 70–99)
Potassium: 3.6 mEq/L (ref 3.5–5.1)
Sodium: 139 mEq/L (ref 135–145)

## 2012-05-29 LAB — CBC
HCT: 37.3 % (ref 36.0–46.0)
Hemoglobin: 12.2 g/dL (ref 12.0–15.0)
MCH: 28.8 pg (ref 26.0–34.0)
MCHC: 32.7 g/dL (ref 30.0–36.0)
MCV: 88.2 fL (ref 78.0–100.0)
Platelets: 146 10*3/uL — ABNORMAL LOW (ref 150–400)
RBC: 4.23 MIL/uL (ref 3.87–5.11)
RDW: 13.7 % (ref 11.5–15.5)
WBC: 6.6 10*3/uL (ref 4.0–10.5)

## 2012-05-29 LAB — GLUCOSE, CAPILLARY
Glucose-Capillary: 167 mg/dL — ABNORMAL HIGH (ref 70–99)
Glucose-Capillary: 186 mg/dL — ABNORMAL HIGH (ref 70–99)
Glucose-Capillary: 150 mg/dL — ABNORMAL HIGH (ref 70–99)
Glucose-Capillary: 150 mg/dL — ABNORMAL HIGH (ref 70–99)

## 2012-05-29 LAB — HEMOGLOBIN A1C
Hgb A1c MFr Bld: 7.4 % — ABNORMAL HIGH (ref <5.7)
Mean Plasma Glucose: 166 mg/dL — ABNORMAL HIGH (ref <117)

## 2012-05-29 MED ORDER — INSULIN ASPART 100 UNIT/ML ~~LOC~~ SOLN: 0.0000 [IU] | Freq: Every day | SUBCUTANEOUS | Status: DC

## 2012-05-29 MED ORDER — INSULIN ASPART 100 UNIT/ML ~~LOC~~ SOLN
0.0000 [IU] | Freq: Three times a day (TID) | SUBCUTANEOUS | Status: DC
  Administered 2012-05-29: 4 [IU] via SUBCUTANEOUS
  Administered 2012-05-29: 3 [IU] via SUBCUTANEOUS
  Administered 2012-05-29 – 2012-05-30 (×2): 4 [IU] via SUBCUTANEOUS

## 2012-05-29 MED ORDER — INSULIN GLARGINE 100 UNIT/ML ~~LOC~~ SOLN
5.0000 [IU] | Freq: Two times a day (BID) | SUBCUTANEOUS | Status: DC
  Administered 2012-05-29 (×2): 5 [IU] via SUBCUTANEOUS

## 2012-05-29 NOTE — Progress Notes (Signed)
Orthopedic Tech Progress Note Patient Details:  Brittany Porter [birth year] [id number] Nurse called to ask for extra abdominal binder for patient because largest size is too small to fit. Binders will be combined to make on large binder for patient use. Binder delivered to Secondary school teacher at desk.  Ortho Devices Type of Ortho Device: Abdominal binder Ortho Device/Splint Interventions: Ordered   [place] R Thompson 05/29/2012, 10:33 AM

## 2012-05-29 NOTE — Progress Notes (Signed)
1 Day Post-Op  Subjective: Patient is alert and stable and in no distress. Denies shortness of breath or cough. Tolerating clear liquids with only transient nausea. Foley is still in, and urine output good. Low-grade temperature, 100.4.  CAGs 170, 187  Labs revealed WBC 6600, hemoglobin 12.2, glucose 173, potassium 3.6, creatinine is 0.56.  Patient and husband are saddened by sudden death of his mother at age 69 yesterday. Family is making plans for funeral.  Objective: Vital signs in last 24 hours: Temp:  [96.9 F (36.1 C)-100.4 F (38 C)] 100.1 F (37.8 C) (01/14 0529) Pulse Rate:  [58-105] 88  (01/14 0529) Resp:  [18-20] 18  (01/14 0529) BP: (102-158)/(49-89) 106/49 mmHg (01/14 0529) SpO2:  [64 %-99 %] 90 % (01/14 0529) Weight:  [233 lb 0.4 oz (105.7 kg)] 233 lb 0.4 oz (105.7 kg) (01/13 2300) Last BM Date: 05/28/12  Intake/Output from previous day: 01/13 0701 - 01/14 0700 In: 2100 [P.O.:800; I.V.:1300] Out: 1810 [Urine:1810] Intake/Output this shift:    General appearance: Alert. No distress. Does not look ill. Skin warm and dry. Mental status normal. Resp: clear to auscultation anteriorly, decreased breath sounds at bases. Vital capacity 1000 cc. GI: obese. Active bowel sounds. Soft. Not distended. All trocar sites looked good. Minimal, appropriate incisional tenderness.  Lab Results:  Results for orders placed during the hospital encounter of 05/28/12 (from the past 24 hour(s))  GLUCOSE, CAPILLARY     Status: Abnormal   Collection Time   05/28/12 10:31 AM      Component Value Range   Glucose-Capillary 170 (*) 70 - 99 mg/dL   Comment 1 Notify RN    HEMOGLOBIN A1C     Status: Abnormal   Collection Time   05/28/12  3:55 PM      Component Value Range   Hemoglobin A1C 7.4 (*) <5.7 %   Mean Plasma Glucose 166 (*) <117 mg/dL  GLUCOSE, CAPILLARY     Status: Abnormal   Collection Time   05/28/12  4:54 PM      Component Value Range   Glucose-Capillary 187 (*) 70 - 99 mg/dL    Comment 1 Notify RN    BASIC METABOLIC PANEL     Status: Abnormal   Collection Time   05/29/12  5:45 AM      Component Value Range   Sodium 139  135 - 145 mEq/L   Potassium 3.6  3.5 - 5.1 mEq/L   Chloride 101  96 - 112 mEq/L   CO2 26  19 - 32 mEq/L   Glucose, Bld 173 (*) 70 - 99 mg/dL   BUN 7  6 - 23 mg/dL   Creatinine, Ser 0.86  0.50 - 1.10 mg/dL   Calcium 8.1 (*) 8.4 - 10.5 mg/dL   GFR calc non Af Amer >90  >90 mL/min   GFR calc Af Amer >90  >90 mL/min  CBC     Status: Abnormal   Collection Time   05/29/12  5:45 AM      Component Value Range   WBC 6.6  4.0 - 10.5 K/uL   RBC 4.23  3.87 - 5.11 MIL/uL   Hemoglobin 12.2  12.0 - 15.0 g/dL   HCT 57.8  46.9 - 62.9 %   MCV 88.2  78.0 - 100.0 fL   MCH 28.8  26.0 - 34.0 pg   MCHC 32.7  30.0 - 36.0 g/dL   RDW 52.8  41.3 - 24.4 %   Platelets 146 (*) 150 -  400 K/uL     Studies/Results: @RISRSLT24 @     . Chlorhexidine Gluconate Cloth  6 each Topical Daily  . ciprofloxacin  500 mg Oral Q12H  . docusate sodium  100 mg Oral q morning - 10a  . heparin subcutaneous  5,000 Units Subcutaneous Q8H  . insulin aspart  0-20 Units Subcutaneous TID WC  . insulin aspart  0-20 Units Subcutaneous TID WC  . insulin aspart  0-5 Units Subcutaneous QHS  . insulin aspart  0-5 Units Subcutaneous QHS  . insulin aspart  6 Units Subcutaneous TID WC  . insulin glargine  5 Units Subcutaneous BID  . letrozole  2.5 mg Oral q morning - 10a  . meloxicam  7.5 mg Oral q morning - 10a  . metFORMIN  500 mg Oral BID WC  . mupirocin ointment  1 application Nasal BID  . ondansetron  4 mg Oral Q6H  . simvastatin  40 mg Oral QHS     Assessment/Plan: s/p Procedure(s): LAPAROSCOPIC VENTRAL HERNIA INSERTION OF MESH LAPAROSCOPIC LYSIS OF ADHESIONS  POD #1. Laparoscopic lysis of adhesions, complex, laparoscopic repair ventral hernia with mesh. Seems quite stable from surgical standpoint. Abdominal exam reassuring. Normal WBC. Will allow full liquids,  discontinue Foley, ambulate.  Low-grade fever, probably atelectasis. Discussed with patient, husband, and nursing staff to emphasize to obliterate what  Diabetes. Non-insulin-dependent. Will add basal insulin to sliding-scale regimen, continue CBG checks a.c. and at bedtime, while hospitalized.  VTE - To start on subcutaneous heparin this morning  Obesity. Push mobilization.  Stage IV breast cancer, in remission on tomorrow. History of endometrial cancer, stage II, and ovarian cancer, stage I. Resected 2010.  No evidence of recurrence noted on general abdominal exploration yesterday. History of malignant melanoma, status post wide excision and sentinel node biopsy for T3b, N0 disease 2011.No known recurrence.     LOS: 1 day    Mehdi Gironda M. Derrell Lolling, M.D., [facility] General and Minimally invasive Surgery Breast and Colorectal Surgery Office:   [phone] Pager:   [phone]  05/29/2012  . .prob

## 2012-05-30 DIAGNOSIS — K660 Peritoneal adhesions (postprocedural) (postinfection): Secondary | ICD-10-CM | POA: Diagnosis not present

## 2012-05-30 DIAGNOSIS — E119 Type 2 diabetes mellitus without complications: Secondary | ICD-10-CM | POA: Diagnosis not present

## 2012-05-30 DIAGNOSIS — K430 Incisional hernia with obstruction, without gangrene: Secondary | ICD-10-CM | POA: Diagnosis not present

## 2012-05-30 LAB — GLUCOSE, CAPILLARY
Glucose-Capillary: 160 mg/dL — ABNORMAL HIGH (ref 70–99)
Glucose-Capillary: 119 mg/dL — ABNORMAL HIGH (ref 70–99)

## 2012-05-30 MED ORDER — HYDROCODONE-ACETAMINOPHEN 5-325 MG PO TABS: 1.0000 | ORAL_TABLET | ORAL | Status: DC | PRN

## 2012-05-30 MED ORDER — POLYETHYLENE GLYCOL 3350 17 G PO PACK
17.0000 g | PACK | Freq: Once | ORAL | Status: AC
  Administered 2012-05-30: 17 g via ORAL
  Filled 2012-05-30: qty 1

## 2012-05-30 MED ORDER — INSULIN GLARGINE 100 UNIT/ML ~~LOC~~ SOLN
10.0000 [IU] | Freq: Two times a day (BID) | SUBCUTANEOUS | Status: DC
  Administered 2012-05-30: 10 [IU] via SUBCUTANEOUS

## 2012-05-30 NOTE — Care Management Note (Signed)
  Page 1 of 1   05/30/2012     9:03:21 AM   CARE MANAGEMENT NOTE 05/30/2012  Patient:  Brittany Porter, Brittany Porter   Account Number:  [id number]  Date Initiated:  05/30/2012  Documentation initiated by:  Ronny Flurry  Subjective/Objective Assessment:     Action/Plan:   Anticipated DC Date:  05/30/2012   Anticipated DC Plan:  HOME/SELF CARE         Choice offered to / List presented to:     DME arranged  Levan Hurst      DME agency  [facility]        Status of service:  Completed, signed off Medicare Important Message given?   (If response is "NO", the following Medicare IM given date fields will be blank) Date Medicare IM given:   Date Additional Medicare IM given:    Discharge Disposition:  HOME/SELF CARE  Per UR Regulation:  Reviewed for med. necessity/level of care/duration of stay  If discussed at Long Length of Stay Meetings, dates discussed:    Comments:

## 2012-05-30 NOTE — Discharge Summary (Addendum)
Patient ID: Brittany Porter [id number] 66 y.o. [birth year]  05/28/2012  Discharge date and time: May 30, 2012  Admitting Physician: Ernestene Mention  Discharge Physician: Ernestene Mention  Admission Diagnoses: ventral hernia  Discharge Diagnoses: Ventral incisional hernia with incarceration and obstruction Extensive intra-abdominal adhesions Non-insulin-dependent diabetes mellitus Obesity Stage IV breast cancer, in remission on Femara History of endometrial cancer, stage II and ovarian cancer stage I, resected 2010. No evidence of recurrence History of malignant melanoma of back, stage T3b., N0, status post wide excision and sentinel biopsy 2011. No known recurrence.  Operations: Procedure(s): LAPAROSCOPIC VENTRAL HERNIA INSERTION OF MESH LAPAROSCOPIC LYSIS OF ADHESIONS  Admission Condition: good  Discharged Condition: good  Indication for Admission: Brittany Porter is a 67 y.o. Female Who is admitted electively for repair of a ventral incisional.  This patient has a complex medical history. She underwent bilateral modified radical mastectomy by Dr. Rolene Course many years ago. 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 Edwyna Shell in 2012. That has resolved and she is on antiestrogen therapy. I do not think she has any other metastatic disease identified. 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 I performed wide excision and sentinel node biopsy 2 years ago. There is no known recurrence to date. In addition she has a history of ovarian and endometrial cancer, treated and followed by Dr. [name] Schimke. She's had abdominal hysterectomy, bilateral oophorectomy, omentectomy, and postop vaginal cuff brachytherapy n June of 2010. I am not aware of any recurrence of that cancer.  In early September of this year she was admitted to the hospital with a small bowel obstruction. She required a nasogastric tube for 3 days and then  the bowel obstruction resolved. She has felt fine since then has no pain tolerating diet normal bowel function. She was unaware that she had a hernia as her symptoms were diffuse pain, not focal pain.  A CT scan was performed on 01/21/2012 shows a ventral incisional hernia near the umbilicus. This contains a loop of bowel and possibly a source of obstruction. She had a fatty liver. There is scarring in her lung bases. There was no abdominal mass or sign of cancer. She has been evaluated recently by Dr. [name] Schimke and Dr. Drue Second who feel that her cancers are under control.  I had a long discussion with the patient and her husband about repairing her ventral hernia. I basically offered to do this and she stated that she wanted to have it repaired.   Hospital Course: On the day of admission the patient was taken to the operating room and underwent a laparoscopic lysis of adhesions and laparoscopic ventral hernia repair with a 25 cm x 20 cm piece of parietex composite(PCO) mesh. The small bowel adhesions to the intra-abdominal wall were extensive and required an hour and a half at least to take down. The lysis of adhesions appeared uneventful. The omentum was surgically absent. There was no evidence of cancer. Postoperatively the patient did fairly well. Her diabetes was managed with basal insulin and sliding scale insulin and we were able to keep her blood sugar at approximately 150. She had some low-grade fever on postop day one which appeared to be atelectasis. Lab work all looked normal. She progressed in her diet and ambulation without much difficulty. On the morning of discharge she was passing flatus and tolerating full liquid diet. She wanted to advance to a regular diet and we  did that as a carbohydrate modified diet. Her exam was good with a soft abdomen, clean wounds and good bowel sounds. Her lungs were clear.  She and her husband strongly desired discharge on this date because the husband's  mother at age 19 died suddenly 2 days previously, and the funeral is scheduled for January 16. From a medical standpoint I felt she was stable. We placed her  on a solid diet and gave her some MiraLAX. And reviewed her discharge instructions. She was given a prescription for Vicodin for pain. She was told to continue all of her usual medications that she was on before. She will not require insulin at home. She was asked to return to see me in the office in 2 weeks.  Consults: None  Significant Diagnostic Studies: none  Treatments: surgery: Laparoscopic lysis of adhesions, extensive, laparoscopic repair of ventral incisional hernia with mesh  Disposition: Home  Patient Instructions:   Brittany Porter, Brittany Porter  Home Medication Instructions UJW:[id number]   Printed on:05/30/12 0724  Medication Information                    simvastatin (ZOCOR) 40 MG tablet Take 40 mg by mouth at bedtime.             fish oil-omega-3 fatty acids 1000 MG capsule Take 1 g by mouth 2 (two) times daily.            metFORMIN (GLUCOPHAGE) 500 MG tablet Take 500 mg by mouth 2 (two) times daily with a meal.             furosemide (LASIX) 20 MG tablet Take 20 mg by mouth daily as needed. For edema           meloxicam (MOBIC) 7.5 MG tablet Take 7.5 mg by mouth every morning.            letrozole (FEMARA) 2.5 MG tablet Take 2.5 mg by mouth every morning.            Calcium Carbonate-Vitamin D (CALCIUM 600+D) 600-400 MG-UNIT per tablet Take 2 tablets by mouth daily.           Cholecalciferol (VITAMIN D3) 2000 UNITS TABS Take 2,000 mg by mouth daily.           ibuprofen (ADVIL,MOTRIN) 200 MG tablet Take 400 mg by mouth every 6 (six) hours as needed. For headache.           ferrous sulfate ([employer] IRON) 325 (65 FE) MG tablet Take 325 mg by mouth daily with breakfast.           docusate sodium (COLACE) 100 MG capsule Take 100 mg by mouth every morning.           ondansetron (ZOFRAN) 4 MG tablet Take 1 tablet (4 mg  total) by mouth every 6 (six) hours.           HYDROcodone-acetaminophen (NORCO/VICODIN) 5-325 MG per tablet Take 1-2 tablets by mouth every 4 (four) hours as needed for pain.             Activity: ambulate a lot. No driving. No heavy lifting. Diet: diabetic diet Wound Care: none needed  Follow-up:  With Dr. Derrell Lolling in 2 weeks.  Signed: Angelia Mould. Derrell Lolling, M.D., FACS General and minimally invasive surgery Breast and Colorectal Surgery  05/30/2012, 7:24 AM

## 2012-05-30 NOTE — Progress Notes (Signed)
2 Days Post-Op  Subjective: Doing pretty well. Angulated twice in the hall. Tolerating full liquids. Passing flatus. No stool. No nausea. No shortness of breath.  CBGs 150 range. On Lantus 5 units twice a day and sliding scale.  She and her husband would very much like for her to be discharged today because her mother-in-law's funeral is  tomorrow. I told them that if the morning went well that would be reasonable. She will need a rolling walker at home.  Objective: Vital signs in last 24 hours: Temp:  [98.4 F (36.9 C)-99.2 F (37.3 C)] 98.6 F (37 C) (01/15 9604) Pulse Rate:  [83-90] 90  (01/15 0608) Resp:  [16-20] 16  (01/15 0608) BP: (114-144)/(54-72) 125/72 mmHg (01/15 0608) SpO2:  [92 %-99 %] 92 % (01/15 0608) Last BM Date: 05/28/12  Intake/Output from previous day: 01/14 0701 - 01/15 0700 In: 600 [P.O.:600] Out: 750 [Urine:750] Intake/Output this shift:    General appearance: alert. No distress. Sitting in chair. Husband in room. Mental status normal. Appears quite comfortable. Resp: clear to auscultation bilaterally GI: abdomen obese. Soft. Active bowel sounds. Wounds looked fine. Minimal tenderness.  Lab Results:  Results for orders placed during the hospital encounter of 05/28/12 (from the past 24 hour(s))  GLUCOSE, CAPILLARY     Status: Abnormal   Collection Time   05/29/12  7:47 AM      Component Value Range   Glucose-Capillary 167 (*) 70 - 99 mg/dL   Comment 1 Notify RN     Comment 2 Documented in Chart    GLUCOSE, CAPILLARY     Status: Abnormal   Collection Time   05/29/12 11:29 AM      Component Value Range   Glucose-Capillary 186 (*) 70 - 99 mg/dL   Comment 1 Notify RN     Comment 2 Documented in Chart    GLUCOSE, CAPILLARY     Status: Abnormal   Collection Time   05/29/12  5:32 PM      Component Value Range   Glucose-Capillary 150 (*) 70 - 99 mg/dL  GLUCOSE, CAPILLARY     Status: Abnormal   Collection Time   05/29/12  8:26 PM      Component Value  Range   Glucose-Capillary 150 (*) 70 - 99 mg/dL     Studies/Results: @RISRSLT24 @     . Chlorhexidine Gluconate Cloth  6 each Topical Daily  . ciprofloxacin  500 mg Oral Q12H  . docusate sodium  100 mg Oral q morning - 10a  . heparin subcutaneous  5,000 Units Subcutaneous Q8H  . insulin aspart  0-20 Units Subcutaneous TID WC  . insulin aspart  0-5 Units Subcutaneous QHS  . insulin glargine  5 Units Subcutaneous BID  . letrozole  2.5 mg Oral q morning - 10a  . meloxicam  7.5 mg Oral q morning - 10a  . metFORMIN  500 mg Oral BID WC  . mupirocin ointment  1 application Nasal BID  . ondansetron  4 mg Oral Q6H  . simvastatin  40 mg Oral QHS     Assessment/Plan: s/p Procedure(s): LAPAROSCOPIC VENTRAL HERNIA INSERTION OF MESH LAPAROSCOPIC LYSIS OF ADHESIONS  POD #2. Laparoscopic lysis of adhesions, complex, laparoscopic repair ventral hernia with mesh.  Seems quite stable from surgical standpoint.   Decrease IV rate. Advanced to carbohydrate modified diet. anticipate discharge this afternoon Case management consult requested to arrange for rolling walker at home. Diet and activities discussed See me in 2 weeks.  Low-grade fever,  probably atelectasis. Resolved  Diabetes. Non-insulin-dependent. Will increase basal insulin to Lantus 10 units twice a day while hospitalized, continuesliding-scale regimen, continue CBG checks a.c. and at bedtime, while hospitalized.   VTE - on subcutaneous heparin this morning   Obesity. Push mobilization.  Stage IV breast cancer, in remission on Femara.  History of endometrial cancer, stage II, and ovarian cancer, stage I. Resected 2010. No evidence of recurrence noted on general abdominal exploration yesterday.  History of malignant melanoma, status post wide excision and sentinel node biopsy for T3b, N0 disease 2011.No known recurrence.     LOS: 2 days    Stephane Junkins M. Derrell Lolling, M.D., [facility] General and Minimally  invasive Surgery Breast and Colorectal Surgery Office:   [phone] Pager:   [phone]  05/30/2012  . .prob

## 2012-06-14 ENCOUNTER — Ambulatory Visit (INDEPENDENT_AMBULATORY_CARE_PROVIDER_SITE_OTHER): Payer: Medicare Other | Admitting: General Surgery

## 2012-06-14 ENCOUNTER — Encounter (INDEPENDENT_AMBULATORY_CARE_PROVIDER_SITE_OTHER): Payer: Self-pay | Admitting: General Surgery

## 2012-06-14 VITALS — BP 118/78 | HR 72 | Temp 96.8°F | Resp 18 | Ht 63.0 in | Wt 227.0 lb

## 2012-06-14 DIAGNOSIS — Z85820 Personal history of malignant melanoma of skin: Secondary | ICD-10-CM | POA: Diagnosis not present

## 2012-06-14 DIAGNOSIS — K430 Incisional hernia with obstruction, without gangrene: Secondary | ICD-10-CM

## 2012-06-14 NOTE — Progress Notes (Signed)
Patient ID: Brittany Porter, female   DOB: 01/27/1946, 67 y.o.   MRN: [id number] History: This patient returns for a postop visit. On 05/28/2012 she underwent a laparoscopic lysis of adhesions and laparoscopic ventral hernia repair with parietal next composite mesh. The lyses of adhesions was very complex requiring 1-1/2 hours with lots of loops of small bowel up in the hernia. The mesh was 20 x 25 cm. There was no evidence of abdominal malignancy.   Fortunately, she has done well so far. Pain is resolving. Appetite normal. Bowel function good. No wound problems. She has complex oncologic problems with history of endometrial cancer, history of metastatic breast cancer, history of melanoma of the back. She is on Femara  Exam: The patient is well. Morbidly obese. No distress. Examined standing and supine. All trocar sites well healed. No unusual tenderness. No fluid collections. No seroma. No hematoma. Hernia repair is intact.  Assessment: Incarcerated ventral hernia, recovering uneventfully in early postoperative following laparoscopic repair with mesh and extensive lysis of adhesions Diabetes mellitus Obesity History of multiple malignancies, followed by Dr. Welton Flakes and Dr. [name] Schimke  Plan: Diet and  activities discussed. Weight loss encouraged Restricted activities until March 1 Return to see me in 6 weeks.    Brittany Porter. Brittany Porter, M.D., [facility] General and Minimally invasive Surgery Breast and Colorectal Surgery Office:   [phone] Pager:   [phone]

## 2012-06-14 NOTE — Patient Instructions (Signed)
You seem to be recovering and healing from your laparoscopic hernia repair without any obvious complications.  No lifting more than 20 pounds until after March 1.  Walk a lot.  You may drive your car when you are comfortable  Return to see Dr. Derrell Lolling in approximately 6 weeks

## 2012-07-05 DIAGNOSIS — N3941 Urge incontinence: Secondary | ICD-10-CM | POA: Diagnosis not present

## 2012-07-05 DIAGNOSIS — Z0000 Encounter for general adult medical examination without abnormal findings: Secondary | ICD-10-CM | POA: Diagnosis not present

## 2012-07-05 DIAGNOSIS — Z1331 Encounter for screening for depression: Secondary | ICD-10-CM | POA: Diagnosis not present

## 2012-07-05 DIAGNOSIS — E119 Type 2 diabetes mellitus without complications: Secondary | ICD-10-CM | POA: Diagnosis not present

## 2012-07-05 DIAGNOSIS — E7800 Pure hypercholesterolemia, unspecified: Secondary | ICD-10-CM | POA: Diagnosis not present

## 2012-07-12 ENCOUNTER — Other Ambulatory Visit (HOSPITAL_BASED_OUTPATIENT_CLINIC_OR_DEPARTMENT_OTHER): Payer: Medicare Other | Admitting: Lab

## 2012-07-12 ENCOUNTER — Telehealth: Payer: Self-pay | Admitting: Oncology

## 2012-07-12 ENCOUNTER — Encounter: Payer: Self-pay | Admitting: Oncology

## 2012-07-12 ENCOUNTER — Ambulatory Visit (HOSPITAL_BASED_OUTPATIENT_CLINIC_OR_DEPARTMENT_OTHER): Payer: Medicare Other | Admitting: Oncology

## 2012-07-12 VITALS — BP 158/86 | HR 78 | Temp 98.6°F | Resp 20 | Ht 63.0 in | Wt 223.6 lb

## 2012-07-12 DIAGNOSIS — C569 Malignant neoplasm of unspecified ovary: Secondary | ICD-10-CM

## 2012-07-12 DIAGNOSIS — C4359 Malignant melanoma of other part of trunk: Secondary | ICD-10-CM | POA: Diagnosis not present

## 2012-07-12 DIAGNOSIS — C549 Malignant neoplasm of corpus uteri, unspecified: Secondary | ICD-10-CM

## 2012-07-12 DIAGNOSIS — C55 Malignant neoplasm of uterus, part unspecified: Secondary | ICD-10-CM

## 2012-07-12 DIAGNOSIS — C50919 Malignant neoplasm of unspecified site of unspecified female breast: Secondary | ICD-10-CM

## 2012-07-12 DIAGNOSIS — Z853 Personal history of malignant neoplasm of breast: Secondary | ICD-10-CM | POA: Diagnosis not present

## 2012-07-12 LAB — COMPREHENSIVE METABOLIC PANEL (CC13)
ALT: 18 U/L (ref 0–55)
AST: 24 U/L (ref 5–34)
Albumin: 3.7 g/dL (ref 3.5–5.0)
Alkaline Phosphatase: 62 U/L (ref 40–150)
BUN: 11.2 mg/dL (ref 7.0–26.0)
CO2: 30 mEq/L — ABNORMAL HIGH (ref 22–29)
Calcium: 9.6 mg/dL (ref 8.4–10.4)
Chloride: 102 mEq/L (ref 98–107)
Creatinine: 0.6 mg/dL (ref 0.6–1.1)
Glucose: 133 mg/dl — ABNORMAL HIGH (ref 70–99)
Potassium: 4.1 mEq/L (ref 3.5–5.1)
Sodium: 143 mEq/L (ref 136–145)
Total Bilirubin: 0.81 mg/dL (ref 0.20–1.20)
Total Protein: 7.2 g/dL (ref 6.4–8.3)

## 2012-07-12 LAB — CBC WITH DIFFERENTIAL/PLATELET
BASO%: 0.3 % (ref 0.0–2.0)
Basophils Absolute: 0 10*3/uL (ref 0.0–0.1)
EOS%: 2.5 % (ref 0.0–7.0)
Eosinophils Absolute: 0.2 10*3/uL (ref 0.0–0.5)
HCT: 41.5 % (ref 34.8–46.6)
HGB: 13.4 g/dL (ref 11.6–15.9)
LYMPH%: 29.7 % (ref 14.0–49.7)
MCH: 28 pg (ref 25.1–34.0)
MCHC: 32.3 g/dL (ref 31.5–36.0)
MCV: 86.8 fL (ref 79.5–101.0)
MONO#: 0.4 10*3/uL (ref 0.1–0.9)
MONO%: 7.3 % (ref 0.0–14.0)
NEUT#: 3.5 10*3/uL (ref 1.5–6.5)
NEUT%: 60.2 % (ref 38.4–76.8)
Platelets: 190 10*3/uL (ref 145–400)
RBC: 4.78 10*6/uL (ref 3.70–5.45)
RDW: 13.3 % (ref 11.2–14.5)
WBC: 5.9 10*3/uL (ref 3.9–10.3)
lymph#: 1.8 10*3/uL (ref 0.9–3.3)
nRBC: 0 % (ref 0–0)

## 2012-07-12 LAB — CANCER ANTIGEN 27.29: CA 27.29: 30 U/mL (ref 0–39)

## 2012-07-12 LAB — LACTATE DEHYDROGENASE (CC13): LDH: 206 U/L (ref 125–245)

## 2012-07-12 LAB — CA 125: CA 125: 5.9 U/mL (ref 0.0–30.2)

## 2012-07-12 NOTE — Telephone Encounter (Signed)
gv pt appt schedule for August. Pt aware [facility] will contact her re ct appt.

## 2012-07-12 NOTE — Patient Instructions (Addendum)
I will see you back in 6months with labs and scans prior to visit

## 2012-07-22 NOTE — Progress Notes (Signed)
OFFICE PROGRESS NOTE  CC  Dr. Claud Kelp Dr. Antony Blackbird, Dr. Merri Brunette  DIAGNOSIS: 67 year old female with  #1 stage II endometrial carcinoma and stage I ovarian carcinoma diagnosed in June 2010.  #2 malignant melanoma diagnosed August 2010 status post wide local excision. 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  CURRENT THERAPY: letrozole 2.5 mg daily since January 2012  INTERVAL HISTORY: Brittany Porter 67 y.o. female returns for followup visit today. Overall she is doing well. Patient is now status post hernia repair. Overall she did well with her surgery. She otherwise denies any fevers chills night sweats headaches no vaginal bleeding. She has no nausea or vomiting she has not noticed any masses no evidence of local recurrence in the chest wall.  MEDICAL HISTORY:iron deficiency hyperlipidemia diabetesfluid overall  ALLERGIES:  is allergic to sulfa antibiotics.  MEDICATIONS:  Current Outpatient Prescriptions  Medication Sig Dispense Refill  . Calcium Carbonate-Vitamin D (CALCIUM 600+D) 600-400 MG-UNIT per tablet Take 2 tablets by mouth daily.      . Cholecalciferol (VITAMIN D3) 2000 UNITS TABS Take 2,000 mg by mouth daily.      Marland Kitchen docusate sodium (COLACE) 100 MG capsule Take 100 mg by mouth every morning.      . ferrous sulfate ([employer] IRON) 325 (65 FE) MG tablet Take 325 mg by mouth daily with breakfast.      . fish oil-omega-3 fatty acids 1000 MG capsule Take 1 g by mouth 2 (two) times daily.       . furosemide (LASIX) 20 MG tablet Take 20 mg by mouth daily as needed. For edema      . HYDROcodone-acetaminophen (NORCO/VICODIN) 5-325 MG per tablet Take 1-2 tablets by mouth every 4 (four) hours as needed for pain.  50 tablet  1  . ibuprofen (ADVIL,MOTRIN) 200 MG tablet Take 400 mg by mouth every 6 (six) hours as needed. For headache.      . letrozole (FEMARA) 2.5 MG tablet  Take 2.5 mg by mouth every morning.       . meloxicam (MOBIC) 7.5 MG tablet Take 7.5 mg by mouth every morning.       . metFORMIN (GLUCOPHAGE) 500 MG tablet Take 500 mg by mouth 2 (two) times daily with a meal.        . ondansetron (ZOFRAN) 4 MG tablet Take 1 tablet (4 mg total) by mouth every 6 (six) hours.  12 tablet  0  . simvastatin (ZOCOR) 40 MG tablet Take 40 mg by mouth at bedtime.         No current facility-administered medications for this visit.    SURGICAL HISTORY:   #1 status post bilateral mastectomies  #2 status post bilateral oophorectomies and hysterectomy.  #3 status post wide local excision of malignant melanoma of the back.  REVIEW OF SYSTEMS:  Pertinent items are noted in HPI.   PHYSICAL EXAMINATION:  Gen.: Awake alert in no acute distress well appearing female HEENT: EOMI PERRLA sclerae anicteric no conjunctival pallor oral mucosa is moist neck is supple there is no palpable cervical supraclavicular or axillary adenopathy  Lungs: Clear bilaterally Cardiovascular: Regular rate rhythm Abdomen: Soft nontender nondistended bowel sounds are present healed surgical scars no hepatosplenomegaly Extremities: +1 edema Neuro: Alert oriented otherwise nonfocal Skin: Back upper back reveals well-healed surgical scar from quite local excision of melanoma. No CVA or spinal tenderness Bilateral mastectomy scars looks well healed there is  no nodularity  or masses ECOG PERFORMANCE STATUS: 1 - Symptomatic but completely ambulatory  Blood pressure 158/86, pulse 78, temperature 98.6 F (37 C), temperature source Oral, resp. rate 20, height 5\' 3"  (1.6 m), weight 223 lb 9.6 oz (101.424 kg).  LABORATORY DATA: Lab Results  Component Value Date   WBC 5.9 07/12/2012   HGB 13.4 07/12/2012   HCT 41.5 07/12/2012   MCV 86.8 07/12/2012   PLT 190 07/12/2012      Chemistry      Component Value Date/Time   NA 143 07/12/2012 0956   NA 139 05/29/2012 0545   NA 140 11/17/2009 1136   K 4.1  07/12/2012 0956   K 3.6 05/29/2012 0545   K 4.2 11/17/2009 1136   CL 102 07/12/2012 0956   CL 101 05/29/2012 0545   CL 103 11/17/2009 1136   CO2 30* 07/12/2012 0956   CO2 26 05/29/2012 0545   CO2 28 11/17/2009 1136   BUN 11.2 07/12/2012 0956   BUN 7 05/29/2012 0545   BUN 13 11/17/2009 1136   CREATININE 0.6 07/12/2012 0956   CREATININE 0.56 05/29/2012 0545   CREATININE 0.6 11/17/2009 1136      Component Value Date/Time   CALCIUM 9.6 07/12/2012 0956   CALCIUM 8.1* 05/29/2012 0545   CALCIUM 9.7 11/17/2009 1136   ALKPHOS 62 07/12/2012 0956   ALKPHOS 58 05/15/2012 0032   ALKPHOS 60 11/17/2009 1136   AST 24 07/12/2012 0956   AST 22 05/15/2012 0032   AST 24 11/17/2009 1136   ALT 18 07/12/2012 0956   ALT 22 05/15/2012 0032   BILITOT 0.81 07/12/2012 0956   BILITOT 0.7 05/15/2012 0032   BILITOT 1.00 11/17/2009 1136       RADIOGRAPHIC STUDIES:  No results found.  ASSESSMENT: 67 year old female with  #1 3 primary malignancies including stage II endometrial carcinoma and stage I ovarian carcinoma diagnosed in June 2010. She was treated aggressively with chemotherapy and radiation. Patient also had a malignant melanoma and she underwent a wide local excision in August 2010. In December 2011 patient developed recurrence of her breast cancer and she was subsequently started on letrozole 2.5 mg daily starting in January 2012. Overall patient is doing remarkably well and we will continue her present treatment.  #2 recent history of bowel obstruction due to strangulated hernia. She is now status post herniorrhaphy.   PLAN:   #1 continue letrozole 2.5 mg daily. She'll see me back in 6 months time. Prior to that she will have labs and scans.  #2 She will continue to followup with Dr. [name] Schimke a   All questions were answered. The patient knows to call the clinic with any problems, questions or concerns. We can certainly see the patient much sooner if necessary.  I spent 25 minutes counseling the patient face to  face. The total time spent in the appointment was 30 minutes.    Drue Second, MD Medical/Oncology [facility] [phone] (beeper) [phone] (Office)

## 2012-07-26 ENCOUNTER — Ambulatory Visit (INDEPENDENT_AMBULATORY_CARE_PROVIDER_SITE_OTHER): Payer: Medicare Other | Admitting: General Surgery

## 2012-07-26 ENCOUNTER — Encounter (INDEPENDENT_AMBULATORY_CARE_PROVIDER_SITE_OTHER): Payer: Self-pay | Admitting: General Surgery

## 2012-07-26 VITALS — BP 130/72 | HR 91 | Temp 96.9°F | Resp 18 | Ht 63.5 in | Wt 224.6 lb

## 2012-07-26 DIAGNOSIS — K430 Incisional hernia with obstruction, without gangrene: Secondary | ICD-10-CM

## 2012-07-26 NOTE — Patient Instructions (Signed)
Your hernia repair is intact and feels very solid and secure.  You are advised to take a walk every day. There are no specific limitations on your activities.  You are encouraged to lose weight  Return to see Dr. Derrell Lolling if further problems arise

## 2012-07-26 NOTE — Progress Notes (Signed)
Patient ID: Brittany Porter, female   DOB: 1945-12-03, 67 y.o.   MRN: [id number] History: This patient underwent laparoscopic lysis of adhesions, which were extensive, and laparoscopic repair of a ventral incisional hernia with mesh on 05/28/2012. She returns in followup. She is asymptomatic and doing well.  Exam: Patient looks well. Overweight. No distress. Abdomen soft and nontender. All the trocar sites are well-healed. The hernia repair is intact. No seroma  Assessment: Incarcerated ventral incisional hernia with history of small bowel obstruction, recovering uneventfully following laparoscopic repair with mesh. History of stage IV breast cancer with malignant pleural effusion, currently stable on estrogen therapy History melanoma of back History of endometrial cancer, no evidence of recurrence  Plan: Diet and activities discussed. Weight reduction encouraged Return to see me if further problems arise.   Angelia Mould. Derrell Lolling, M.D., [facility] General and Minimally invasive Surgery Breast and Colorectal Surgery Office:   [phone] Pager:   [phone]

## 2012-08-24 DIAGNOSIS — M19079 Primary osteoarthritis, unspecified ankle and foot: Secondary | ICD-10-CM | POA: Diagnosis not present

## 2012-08-24 DIAGNOSIS — M25569 Pain in unspecified knee: Secondary | ICD-10-CM | POA: Diagnosis not present

## 2012-11-26 ENCOUNTER — Ambulatory Visit: Payer: Medicare Other | Admitting: Radiation Oncology

## 2012-12-03 ENCOUNTER — Ambulatory Visit: Payer: Medicare Other | Admitting: Radiation Oncology

## 2012-12-04 ENCOUNTER — Ambulatory Visit: Payer: Medicare Other | Admitting: Radiation Oncology

## 2012-12-26 ENCOUNTER — Telehealth: Payer: Self-pay | Admitting: Oncology

## 2012-12-26 NOTE — Telephone Encounter (Signed)
, °

## 2013-01-03 ENCOUNTER — Ambulatory Visit (HOSPITAL_COMMUNITY): Payer: Medicare Other

## 2013-01-03 ENCOUNTER — Other Ambulatory Visit: Payer: Medicare Other | Admitting: Lab

## 2013-01-10 ENCOUNTER — Ambulatory Visit: Payer: Medicare Other | Admitting: Oncology

## 2013-01-10 ENCOUNTER — Ambulatory Visit: Payer: Medicare Other | Admitting: Radiation Oncology

## 2013-02-01 ENCOUNTER — Encounter: Payer: Self-pay | Admitting: Oncology

## 2013-02-05 ENCOUNTER — Telehealth: Payer: Self-pay | Admitting: Oncology

## 2013-02-05 ENCOUNTER — Ambulatory Visit: Payer: Medicare Other | Admitting: Radiation Oncology

## 2013-02-05 NOTE — Telephone Encounter (Signed)
Called Brittany Porter to see if she was coming to her follow up appointment.  Asked her to call [phone] to reschedule.

## 2013-02-07 ENCOUNTER — Telehealth: Payer: Self-pay | Admitting: *Deleted

## 2013-02-07 NOTE — Telephone Encounter (Signed)
sw pt gv lab for 02/20/13 @9 :30am. Pt is aware...td

## 2013-02-07 NOTE — Telephone Encounter (Signed)
sw pt she called to cancel her appts for 02/15/13 and to rs. gv appt for 03/07/13@ 10am w/KK. Pt is aware...td

## 2013-02-08 ENCOUNTER — Ambulatory Visit (HOSPITAL_COMMUNITY)
Admission: RE | Admit: 2013-02-08 | Discharge: 2013-02-08 | Disposition: A | Discharge status: Home / Self Care | Payer: Medicare Other | Source: Ambulatory Visit | Attending: Oncology | Admitting: Oncology

## 2013-02-08 ENCOUNTER — Other Ambulatory Visit: Payer: Medicare Other | Admitting: Lab

## 2013-02-15 ENCOUNTER — Ambulatory Visit: Payer: Medicare Other | Admitting: Oncology

## 2013-02-20 ENCOUNTER — Ambulatory Visit (HOSPITAL_COMMUNITY)
Admission: RE | Admit: 2013-02-20 | Discharge: 2013-02-20 | Disposition: A | Discharge status: Home / Self Care | Payer: Medicare Other | Source: Ambulatory Visit | Attending: Oncology | Admitting: Oncology

## 2013-02-20 ENCOUNTER — Other Ambulatory Visit (HOSPITAL_BASED_OUTPATIENT_CLINIC_OR_DEPARTMENT_OTHER): Payer: Medicare Other | Admitting: Lab

## 2013-02-20 ENCOUNTER — Encounter (INDEPENDENT_AMBULATORY_CARE_PROVIDER_SITE_OTHER): Payer: Self-pay

## 2013-02-20 DIAGNOSIS — C549 Malignant neoplasm of corpus uteri, unspecified: Secondary | ICD-10-CM | POA: Diagnosis not present

## 2013-02-20 DIAGNOSIS — Z923 Personal history of irradiation: Secondary | ICD-10-CM | POA: Diagnosis not present

## 2013-02-20 DIAGNOSIS — C569 Malignant neoplasm of unspecified ovary: Secondary | ICD-10-CM

## 2013-02-20 DIAGNOSIS — Z853 Personal history of malignant neoplasm of breast: Secondary | ICD-10-CM | POA: Insufficient documentation

## 2013-02-20 DIAGNOSIS — Z9221 Personal history of antineoplastic chemotherapy: Secondary | ICD-10-CM | POA: Diagnosis not present

## 2013-02-20 DIAGNOSIS — K7689 Other specified diseases of liver: Secondary | ICD-10-CM | POA: Diagnosis not present

## 2013-02-20 DIAGNOSIS — M899 Disorder of bone, unspecified: Secondary | ICD-10-CM | POA: Insufficient documentation

## 2013-02-20 DIAGNOSIS — C55 Malignant neoplasm of uterus, part unspecified: Secondary | ICD-10-CM

## 2013-02-20 DIAGNOSIS — C4359 Malignant melanoma of other part of trunk: Secondary | ICD-10-CM | POA: Diagnosis not present

## 2013-02-20 DIAGNOSIS — K449 Diaphragmatic hernia without obstruction or gangrene: Secondary | ICD-10-CM | POA: Insufficient documentation

## 2013-02-20 DIAGNOSIS — C50919 Malignant neoplasm of unspecified site of unspecified female breast: Secondary | ICD-10-CM | POA: Diagnosis not present

## 2013-02-20 DIAGNOSIS — C3490 Malignant neoplasm of unspecified part of unspecified bronchus or lung: Secondary | ICD-10-CM | POA: Diagnosis not present

## 2013-02-20 DIAGNOSIS — R911 Solitary pulmonary nodule: Secondary | ICD-10-CM | POA: Diagnosis not present

## 2013-02-20 DIAGNOSIS — Z85820 Personal history of malignant melanoma of skin: Secondary | ICD-10-CM | POA: Insufficient documentation

## 2013-02-20 DIAGNOSIS — J984 Other disorders of lung: Secondary | ICD-10-CM | POA: Diagnosis not present

## 2013-02-20 DIAGNOSIS — R091 Pleurisy: Secondary | ICD-10-CM | POA: Insufficient documentation

## 2013-02-20 LAB — CBC WITH DIFFERENTIAL/PLATELET
BASO%: 0.6 % (ref 0.0–2.0)
Basophils Absolute: 0 10*3/uL (ref 0.0–0.1)
EOS%: 3.1 % (ref 0.0–7.0)
Eosinophils Absolute: 0.2 10*3/uL (ref 0.0–0.5)
HCT: 42.3 % (ref 34.8–46.6)
HGB: 14.1 g/dL (ref 11.6–15.9)
LYMPH%: 30.8 % (ref 14.0–49.7)
MCH: 29.3 pg (ref 25.1–34.0)
MCHC: 33.3 g/dL (ref 31.5–36.0)
MCV: 88.1 fL (ref 79.5–101.0)
MONO#: 0.5 10*3/uL (ref 0.1–0.9)
MONO%: 7.7 % (ref 0.0–14.0)
NEUT#: 3.4 10*3/uL (ref 1.5–6.5)
NEUT%: 57.8 % (ref 38.4–76.8)
Platelets: 144 10*3/uL — ABNORMAL LOW (ref 145–400)
RBC: 4.8 10*6/uL (ref 3.70–5.45)
RDW: 13.6 % (ref 11.2–14.5)
WBC: 5.9 10*3/uL (ref 3.9–10.3)
lymph#: 1.8 10*3/uL (ref 0.9–3.3)

## 2013-02-20 LAB — COMPREHENSIVE METABOLIC PANEL (CC13)
ALT: 20 U/L (ref 0–55)
AST: 23 U/L (ref 5–34)
Albumin: 3.6 g/dL (ref 3.5–5.0)
Alkaline Phosphatase: 59 U/L (ref 40–150)
Anion Gap: 10 mEq/L (ref 3–11)
BUN: 14.3 mg/dL (ref 7.0–26.0)
CO2: 30 mEq/L — ABNORMAL HIGH (ref 22–29)
Calcium: 9.7 mg/dL (ref 8.4–10.4)
Chloride: 104 mEq/L (ref 98–109)
Creatinine: 0.7 mg/dL (ref 0.6–1.1)
Glucose: 208 mg/dl — ABNORMAL HIGH (ref 70–140)
Potassium: 5.2 mEq/L — ABNORMAL HIGH (ref 3.5–5.1)
Sodium: 144 mEq/L (ref 136–145)
Total Bilirubin: 0.94 mg/dL (ref 0.20–1.20)
Total Protein: 7 g/dL (ref 6.4–8.3)

## 2013-02-20 MED ORDER — IOHEXOL 300 MG/ML  SOLN
100.0000 mL | Freq: Once | INTRAMUSCULAR | Status: AC | PRN
  Administered 2013-02-20: 80 mL via INTRAVENOUS

## 2013-03-06 ENCOUNTER — Telehealth: Payer: Self-pay | Admitting: *Deleted

## 2013-03-06 ENCOUNTER — Encounter: Payer: Self-pay | Admitting: Oncology

## 2013-03-06 ENCOUNTER — Ambulatory Visit (HOSPITAL_BASED_OUTPATIENT_CLINIC_OR_DEPARTMENT_OTHER): Payer: Medicare Other | Admitting: Oncology

## 2013-03-06 VITALS — BP 179/82 | HR 83 | Temp 98.7°F | Resp 18 | Ht 63.5 in | Wt 231.5 lb

## 2013-03-06 DIAGNOSIS — Z23 Encounter for immunization: Secondary | ICD-10-CM | POA: Diagnosis not present

## 2013-03-06 DIAGNOSIS — C569 Malignant neoplasm of unspecified ovary: Secondary | ICD-10-CM

## 2013-03-06 DIAGNOSIS — C55 Malignant neoplasm of uterus, part unspecified: Secondary | ICD-10-CM | POA: Diagnosis not present

## 2013-03-06 DIAGNOSIS — M8580 Other specified disorders of bone density and structure, unspecified site: Secondary | ICD-10-CM

## 2013-03-06 DIAGNOSIS — C4359 Malignant melanoma of other part of trunk: Secondary | ICD-10-CM

## 2013-03-06 MED ORDER — INFLUENZA VAC SPLIT QUAD 0.5 ML IM SUSP
0.5000 mL | Freq: Once | INTRAMUSCULAR | Status: AC
  Administered 2013-03-06: 0.5 mL via INTRAMUSCULAR
  Filled 2013-03-06: qty 0.5

## 2013-03-06 NOTE — Addendum Note (Signed)
Addended by: Maryagnes Amos D on: 03/06/2013 11:02 AM   Modules accepted: Orders

## 2013-03-06 NOTE — Patient Instructions (Signed)
We will obtain a bone density scan  We will see you back in 6 months  Continue letrozole

## 2013-03-06 NOTE — Telephone Encounter (Signed)
appts made and printed...td 

## 2013-03-06 NOTE — Progress Notes (Signed)
OFFICE PROGRESS NOTE  CC  Dr. Claud Kelp Dr. Antony Blackbird, Dr. Merri Brunette  DIAGNOSIS: 67 year old female with  #1 stage II endometrial carcinoma and stage I ovarian carcinoma diagnosed in June 2010.  #2 malignant melanoma diagnosed August 2010 status post wide local excision. 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  CURRENT THERAPY: letrozole 2.5 mg daily since January 2012  INTERVAL HISTORY: Brittany Porter 67 y.o. female returns for followup visit today. Overall she is doing well. She had repeat CT scan of chest which reveals no evidence of recurrent cancer. She otherwise denies any fevers chills night sweats headaches no vaginal bleeding. She has no nausea or vomiting she has not noticed any masses no evidence of local recurrence in the chest wall.  MEDICAL HISTORY:iron deficiency hyperlipidemia diabetesfluid overall  ALLERGIES:  is allergic to sulfa antibiotics.  MEDICATIONS:  Current Outpatient Prescriptions  Medication Sig Dispense Refill  . Calcium Carbonate-Vitamin D (CALCIUM 600+D) 600-400 MG-UNIT per tablet Take 2 tablets by mouth daily.      . Cholecalciferol (VITAMIN D3) 2000 UNITS TABS Take 2,000 mg by mouth daily.      Marland Kitchen docusate sodium (COLACE) 100 MG capsule Take 100 mg by mouth every morning.      . ferrous sulfate ([employer] IRON) 325 (65 FE) MG tablet Take 325 mg by mouth daily with breakfast.      . fish oil-omega-3 fatty acids 1000 MG capsule Take 1 g by mouth 2 (two) times daily.       . furosemide (LASIX) 20 MG tablet Take 20 mg by mouth daily as needed. For edema      . ibuprofen (ADVIL,MOTRIN) 200 MG tablet Take 400 mg by mouth every 6 (six) hours as needed. For headache.      . letrozole (FEMARA) 2.5 MG tablet Take 2.5 mg by mouth every morning.       . meloxicam (MOBIC) 7.5 MG tablet Take 7.5 mg by mouth every morning.       . metFORMIN (GLUCOPHAGE) 500 MG  tablet Take 500 mg by mouth 2 (two) times daily with a meal.        . ondansetron (ZOFRAN) 4 MG tablet Take 1 tablet (4 mg total) by mouth every 6 (six) hours.  12 tablet  0  . simvastatin (ZOCOR) 40 MG tablet Take 40 mg by mouth at bedtime.         No current facility-administered medications for this visit.    SURGICAL HISTORY:   #1 status post bilateral mastectomies  #2 status post bilateral oophorectomies and hysterectomy.  #3 status post wide local excision of malignant melanoma of the back.  REVIEW OF SYSTEMS:  Pertinent items are noted in HPI.   PHYSICAL EXAMINATION:  Gen.: Awake alert in no acute distress well appearing female HEENT: EOMI PERRLA sclerae anicteric no conjunctival pallor oral mucosa is moist neck is supple there is no palpable cervical supraclavicular or axillary adenopathy  Lungs: Clear bilaterally Cardiovascular: Regular rate rhythm Abdomen: Soft nontender nondistended bowel sounds are present healed surgical scars no hepatosplenomegaly Extremities: +1 edema Neuro: Alert oriented otherwise nonfocal Skin: Back upper back reveals well-healed surgical scar from quite local excision of melanoma. No CVA or spinal tenderness Bilateral mastectomy scars looks well healed there is no nodularity  or masses ECOG PERFORMANCE STATUS: 1 - Symptomatic but completely ambulatory  Blood pressure 179/82, pulse 83, temperature 98.7 F (37.1 C), temperature  source Oral, resp. rate 18, height 5' 3.5" (1.613 m), weight 231 lb 8 oz (105.008 kg).  LABORATORY DATA: Lab Results  Component Value Date   WBC 5.9 02/20/2013   HGB 14.1 02/20/2013   HCT 42.3 02/20/2013   MCV 88.1 02/20/2013   PLT 144* 02/20/2013      Chemistry      Component Value Date/Time   NA 144 02/20/2013 0904   NA 139 05/29/2012 0545   NA 140 11/17/2009 1136   K 5.2* 02/20/2013 0904   K 3.6 05/29/2012 0545   K 4.2 11/17/2009 1136   CL 102 07/12/2012 0956   CL 101 05/29/2012 0545   CL 103 11/17/2009 1136   CO2 30*  02/20/2013 0904   CO2 26 05/29/2012 0545   CO2 28 11/17/2009 1136   BUN 14.3 02/20/2013 0904   BUN 7 05/29/2012 0545   BUN 13 11/17/2009 1136   CREATININE 0.7 02/20/2013 0904   CREATININE 0.56 05/29/2012 0545   CREATININE 0.6 11/17/2009 1136      Component Value Date/Time   CALCIUM 9.7 02/20/2013 0904   CALCIUM 8.1* 05/29/2012 0545   CALCIUM 9.7 11/17/2009 1136   ALKPHOS 59 02/20/2013 0904   ALKPHOS 58 05/15/2012 0032   ALKPHOS 60 11/17/2009 1136   AST 23 02/20/2013 0904   AST 22 05/15/2012 0032   AST 24 11/17/2009 1136   ALT 20 02/20/2013 0904   ALT 22 05/15/2012 0032   ALT 23 11/17/2009 1136   BILITOT 0.94 02/20/2013 0904   BILITOT 0.7 05/15/2012 0032   BILITOT 1.00 11/17/2009 1136       RADIOGRAPHIC STUDIES:  EXAM:  CT CHEST WITH CONTRAST  TECHNIQUE:  Multidetector CT imaging of the chest was performed during  intravenous contrast administration.  CONTRAST: 80mL OMNIPAQUE IOHEXOL 300 MG/ML SOLN  COMPARISON: Plain film chest of 05/22/2012. Most recent chest CT of  03/30/2010.  FINDINGS:  Lungs/Pleura: 3 mm medial right apical lung nodule on image 13/  series 5. Felt to be similar to on the prior exam, consistent with a  benign etiology.  Areas of volume loss and probable atelectasis or scarring within the  dependent lingula and left lower lobe.  Left-sided pleural thickening with calcification. Likely related to  prior pleurodesis. No well-defined dominant left pleural mass. Trace  fluid inferiorly. No right-sided pleural effusion.  Heart/Mediastinum: No supraclavicular adenopathy. Bilateral axillary  node dissection. Bilateral mastectomy. No axillary adenopathy. Mild  cardiomegaly, without pericardial effusion. No central pulmonary  embolism, on this non-dedicated study. No mediastinal or hilar  adenopathy. A small hiatal hernia.  Upper Abdomen: Marked hepatic steatosis. Mild motion degradation in  the upper abdomen. Normal imaged adrenal glands.  Bones/Musculoskeletal: Left humeral  head sclerotic lesion. 1.1 cm on  image 6/series 2. Present, measuring 9 mm on the prior exam.  IMPRESSION:  1. Left-sided pleural thickening and calcification with minimal  pleural fluid. Findings are likely related to treated metastatic  disease and prior pleurodesis.  2. Volume loss of the left lung base with areas of probable  atelectasis or scarring.  3. No convincing evidence of recurrent or metastatic disease within  the chest.  4. Hepatic steatosis.  5. Left humeral head sclerotic lesion. Given presence 03/30/2010,  favored to represent a benign bone island. This does measure  slightly larger today, possibly due to differences in slice  selection.  Electronically Signed  By: Jeronimo Greaves M.D.  On: 02/20/2013 10:37     ASSESSMENT: 67 year old female with  #1 3  primary malignancies including stage II endometrial carcinoma and stage I ovarian carcinoma diagnosed in June 2010. She was treated aggressively with chemotherapy and radiation. Patient also had a malignant melanoma and she underwent a wide local excision in August 2010. In December 2011 patient developed recurrence of her breast cancer and she was subsequently started on letrozole 2.5 mg daily starting in January 2012. Overall patient is doing remarkably well and we will continue her present treatment.  #2 recent history of bowel obstruction due to strangulated hernia. She is now status post herniorrhaphy.  #3 CT scans obtained on 10/8 no evidence of progression  #4 Last bone density scan 2 years ago   PLAN:   #1 continue letrozole 2.5 mg daily. She'll see me back in 6 months time. Prior to that she will have labs and scans.  #2 we will perform Dexa scan  #3 She will continue to followup with Dr. [name] Schimke and Dr Roselind Messier   All questions were answered. The patient knows to call the clinic with any problems, questions or concerns. We can certainly see the patient much sooner if necessary.  I spent 25 minutes  counseling the patient face to face. The total time spent in the appointment was 25 minutes.    Drue Second, MD Medical/Oncology [facility] [phone] (beeper) [phone] (Office)

## 2013-03-07 ENCOUNTER — Ambulatory Visit: Payer: Medicare Other | Admitting: Radiation Oncology

## 2013-03-13 ENCOUNTER — Ambulatory Visit
Admission: RE | Admit: 2013-03-13 | Discharge: 2013-03-13 | Disposition: A | Discharge status: Home / Self Care | Payer: Medicare Other | Source: Ambulatory Visit | Attending: Radiation Oncology | Admitting: Radiation Oncology

## 2013-03-13 ENCOUNTER — Ambulatory Visit
Admission: RE | Admit: 2013-03-13 | Discharge: 2013-03-13 | Disposition: A | Discharge status: Home / Self Care | Payer: Medicare Other | Source: Ambulatory Visit | Attending: Oncology | Admitting: Oncology

## 2013-03-13 ENCOUNTER — Encounter: Payer: Self-pay | Admitting: Radiation Oncology

## 2013-03-13 VITALS — BP 185/102 | HR 87 | Temp 98.6°F | Ht 63.5 in | Wt 233.2 lb

## 2013-03-13 DIAGNOSIS — C55 Malignant neoplasm of uterus, part unspecified: Secondary | ICD-10-CM

## 2013-03-13 DIAGNOSIS — Z780 Asymptomatic menopausal state: Secondary | ICD-10-CM | POA: Diagnosis not present

## 2013-03-13 DIAGNOSIS — M8580 Other specified disorders of bone density and structure, unspecified site: Secondary | ICD-10-CM

## 2013-03-13 NOTE — Progress Notes (Signed)
[name] here for follow up after treatment for endometrial cancer.  She denies pain today except for arthritis pain in the mornings.  She denies nausea, vaginal bleeding and discharge.  She denies any bowel issues.  She has occasional fatigue.  She is taking femara daily.

## 2013-03-13 NOTE — Progress Notes (Signed)
Radiation Oncology         [phone] ________________________________  Name: Brittany Porter MRN: [id number]  Date: 03/13/2013  DOB: [birth year]  Follow-Up Visit Note  CC: Allean Found, MD  [name] Schimke, MD  Diagnosis:   Stage II endometrial cancer  Interval Since Last Radiation:  3 years and 9 months .  The patient completed intracavitary brachytherapy treatments  Narrative:  The patient returns today for routine follow-up.  She denies any pelvic pain vaginal bleeding hematuria or rectal bleeding. She continues to use her vaginal dilator on a intermittent basis.  She continues on Femara for management of her metastatic breast cancer.  She continues to see Dr. Welton Flakes for this issue.  She is scheduled to see Dr. Nelly Rout in 3 weeks  and therefore pelvic exam and Pap smear was not performed today.  The patient has missed several followup appointments in our department and therefore her followup today is close to her gynecological oncology followup.                  ALLERGIES:  is allergic to sulfa antibiotics.  Meds: Current Outpatient Prescriptions  Medication Sig Dispense Refill  . Calcium Carbonate-Vitamin D (CALCIUM 600+D) 600-400 MG-UNIT per tablet Take 2 tablets by mouth daily.      . Cholecalciferol (VITAMIN D3) 2000 UNITS TABS Take 2,000 mg by mouth daily.      Marland Kitchen docusate sodium (COLACE) 100 MG capsule Take 100 mg by mouth every morning.      . ferrous sulfate ([employer] IRON) 325 (65 FE) MG tablet Take 325 mg by mouth daily with breakfast.      . fish oil-omega-3 fatty acids 1000 MG capsule Take 1 g by mouth 2 (two) times daily.       . furosemide (LASIX) 20 MG tablet Take 20 mg by mouth daily as needed. For edema      . ibuprofen (ADVIL,MOTRIN) 200 MG tablet Take 400 mg by mouth every 6 (six) hours as needed. For headache.      . letrozole (FEMARA) 2.5 MG tablet Take 2.5 mg by mouth every morning.       . meloxicam (MOBIC) 7.5 MG tablet Take 7.5 mg by mouth every morning.        . metFORMIN (GLUCOPHAGE) 500 MG tablet Take 500 mg by mouth 2 (two) times daily with a meal.        . simvastatin (ZOCOR) 40 MG tablet Take 40 mg by mouth at bedtime.        . ondansetron (ZOFRAN) 4 MG tablet Take 1 tablet (4 mg total) by mouth every 6 (six) hours.  12 tablet  0   No current facility-administered medications for this encounter.    Physical Findings: The patient is in no acute distress. Patient is alert and oriented.  height is 5' 3.5" (1.613 m) and weight is 233 lb 3.2 oz (105.779 kg). Her temperature is 98.6 F (37 C). Her blood pressure is 185/102 and her pulse is 87. Marland Kitchen  No pelvic examination and Pap smear today in light of the patient's upcoming exam by gynecology oncology.  Lab Findings: Lab Results  Component Value Date   WBC 5.9 02/20/2013   HGB 14.1 02/20/2013   HCT 42.3 02/20/2013   MCV 88.1 02/20/2013   PLT 144* 02/20/2013      Radiographic Findings: Ct Chest W Contrast  02/20/2013   CLINICAL DATA:  Breast cancer in 1987. Ovarian/ endometrial cancer in 2010. Chemotherapy and radiation  therapy completed. Bilateral mastectomies. No current complaints. Melanoma of upper back. History of "Stage IV lung cancer".  EXAM: CT CHEST WITH CONTRAST  TECHNIQUE: Multidetector CT imaging of the chest was performed during intravenous contrast administration.  CONTRAST:  80mL OMNIPAQUE IOHEXOL 300 MG/ML  SOLN  COMPARISON:  Plain film chest of 05/22/2012. Most recent chest CT of 03/30/2010.  FINDINGS: Lungs/Pleura: 3 mm medial right apical lung nodule on image 13/ series 5. Felt to be similar to on the prior exam, consistent with a benign etiology.  Areas of volume loss and probable atelectasis or scarring within the dependent lingula and left lower lobe.  Left-sided pleural thickening with calcification. Likely related to prior pleurodesis. No well-defined dominant left pleural mass. Trace fluid inferiorly. No right-sided pleural effusion.  Heart/Mediastinum: No supraclavicular  adenopathy. Bilateral axillary node dissection. Bilateral mastectomy. No axillary adenopathy. Mild cardiomegaly, without pericardial effusion. No central pulmonary embolism, on this non-dedicated study. No mediastinal or hilar adenopathy. A small hiatal hernia.  Upper Abdomen: Marked hepatic steatosis. Mild motion degradation in the upper abdomen. Normal imaged adrenal glands.  Bones/Musculoskeletal: Left humeral head sclerotic lesion. 1.1 cm on image 6/series 2. Present, measuring 9 mm on the prior exam.  IMPRESSION: 1. Left-sided pleural thickening and calcification with minimal pleural fluid. Findings are likely related to treated metastatic disease and prior pleurodesis. 2. Volume loss of the left lung base with areas of probable atelectasis or scarring. 3. No convincing evidence of recurrent or metastatic disease within the chest. 4. Hepatic steatosis. 5. Left humeral head sclerotic lesion. Given presence 03/30/2010, favored to represent a benign bone island. This does measure slightly larger today, possibly due to differences in slice selection.   Electronically Signed   By: Jeronimo Greaves M.D.   On: 02/20/2013 10:37    Impression:  Stage II endometrial cancer.  Plan:  When necessary followup in radiation oncology area the patient will continue followup with gynecologic oncology up to 5 years.  I have recommended she followup with her primary care physician concerning her elevated blood pressure. She will continue followup with medical oncology concerning her metastatic breast cancer.  _____________________________________  -----------------------------------  Billie Lade, PhD, MD

## 2013-03-26 ENCOUNTER — Ambulatory Visit: Payer: Medicare Other | Admitting: Gynecologic Oncology

## 2013-04-02 ENCOUNTER — Other Ambulatory Visit (HOSPITAL_COMMUNITY)
Admission: RE | Admit: 2013-04-02 | Discharge: 2013-04-02 | Disposition: A | Discharge status: Home / Self Care | Payer: Medicare Other | Source: Ambulatory Visit | Attending: Gynecologic Oncology | Admitting: Gynecologic Oncology

## 2013-04-02 ENCOUNTER — Ambulatory Visit: Payer: Medicare Other | Attending: Gynecologic Oncology | Admitting: Gynecologic Oncology

## 2013-04-02 ENCOUNTER — Encounter: Payer: Self-pay | Admitting: Gynecologic Oncology

## 2013-04-02 ENCOUNTER — Ambulatory Visit: Payer: Medicare Other | Admitting: Gynecologic Oncology

## 2013-04-02 VITALS — BP 170/93 | HR 94 | Temp 99.0°F | Resp 20 | Ht 62.99 in | Wt 231.0 lb

## 2013-04-02 DIAGNOSIS — L988 Other specified disorders of the skin and subcutaneous tissue: Secondary | ICD-10-CM | POA: Diagnosis not present

## 2013-04-02 DIAGNOSIS — Z124 Encounter for screening for malignant neoplasm of cervix: Secondary | ICD-10-CM | POA: Diagnosis not present

## 2013-04-02 DIAGNOSIS — C549 Malignant neoplasm of corpus uteri, unspecified: Secondary | ICD-10-CM | POA: Diagnosis not present

## 2013-04-02 DIAGNOSIS — C569 Malignant neoplasm of unspecified ovary: Secondary | ICD-10-CM | POA: Insufficient documentation

## 2013-04-02 DIAGNOSIS — Z90710 Acquired absence of both cervix and uterus: Secondary | ICD-10-CM | POA: Diagnosis not present

## 2013-04-02 DIAGNOSIS — Z923 Personal history of irradiation: Secondary | ICD-10-CM | POA: Insufficient documentation

## 2013-04-02 DIAGNOSIS — Z9221 Personal history of antineoplastic chemotherapy: Secondary | ICD-10-CM | POA: Insufficient documentation

## 2013-04-02 DIAGNOSIS — R32 Unspecified urinary incontinence: Secondary | ICD-10-CM | POA: Diagnosis not present

## 2013-04-02 DIAGNOSIS — Z79899 Other long term (current) drug therapy: Secondary | ICD-10-CM | POA: Insufficient documentation

## 2013-04-02 DIAGNOSIS — Z9079 Acquired absence of other genital organ(s): Secondary | ICD-10-CM | POA: Diagnosis not present

## 2013-04-02 DIAGNOSIS — C50919 Malignant neoplasm of unspecified site of unspecified female breast: Secondary | ICD-10-CM | POA: Diagnosis not present

## 2013-04-02 DIAGNOSIS — E119 Type 2 diabetes mellitus without complications: Secondary | ICD-10-CM | POA: Insufficient documentation

## 2013-04-02 DIAGNOSIS — Z9010 Acquired absence of unspecified breast and nipple: Secondary | ICD-10-CM | POA: Diagnosis not present

## 2013-04-02 NOTE — Progress Notes (Signed)
Gyn-Onc:  Follow-up Note:    Brittany Porter 67 y.o. female  CC:  Endometrial cancer surveillance, ovarian cancer surveillance.  HPI:   Endometrial and ovarian cancer surveillance.   This is a 67 y.o. who on May 16, 2008, underwent operative staging of presumed endometrial adenocarcinoma. Intraoperative findings were also notable for an endometrioid cancer of the right ovary. The procedure that performed at  that time was a total abdominal hysterectomy, bilateral salpingo- oophorectomy, omentectomy, appendectomy, and umbilical hernia repair. 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 She subsequently received 6  cycles of Taxol and carboplatin therapy. Last cycle administered in November 2010. She underwent vaginal cuff brachytherapy for stage II endometrial cancer. She has completed in January 2011. Recurrence of her breast cancer was identified earlier this year and she is on letrozole therapy under the care of Dr. Welton Flakes.  Interval History: SBO last year was managed conservatively.  She underwent Lsc hernia repair 05/2012 by Dr. Derrell Lolling..   Pap test in June of 2012 within normal limits  Social Hx:   History   Social History  . Marital Status: Married    Spouse Name: N/A    Number of Children: N/A  . Years of Education: N/A   Occupational History  . Not on file.   Social History Main Topics  . Smoking status: Never Smoker   . Smokeless tobacco: Not on file  . Alcohol Use: No  . Drug Use: No  . Sexual Activity: Yes   Other Topics Concern  . Not on file   Social History Narrative  . No narrative on file    Past Surgical Hx:  Past Surgical History  Procedure Laterality Date  . Abdominal hysterectomy  10/2008  . Melanoma removal  02/2009, 07/2009  . Knee arthroscopy  04/2010    Right knee  . Mastectomy  1997    Bilateral with lymph nodes  . Ventral hernia repair  05/28/2012    Procedure: LAPAROSCOPIC VENTRAL  HERNIA;  Surgeon: Ernestene Mention, MD;  Location: [facility] OR;  Service: General;  Laterality: N/A;  . Insertion of mesh  05/28/2012    Procedure: INSERTION OF MESH;  Surgeon: Ernestene Mention, MD;  Location: [facility] OR;  Service: General;  Laterality: N/A;  . Laparoscopic lysis of adhesions  05/28/2012    Procedure: LAPAROSCOPIC LYSIS OF ADHESIONS;  Surgeon: Ernestene Mention, MD;  Location: [facility] OR;  Service: General;  Laterality: N/A;    Past Medical Hx:  Past Medical History  Diagnosis Date  . Pleural effusion 04/2010  . Breast cancer   . Ovarian cancer   . Endometrial carcinoma 10/2008  . Iron deficiency   . Diabetes mellitus   . Hyperlipidemia   . Fluid overload   . Arthritis   . History of radiation therapy 04/13/2009, 04/16/2009, 04/27/2009, 05/07/2009, 05/18/2009    3000 cGy to proximal vagina    Family Hx:  Family History  Problem Relation Age of Onset  . Stroke Mother   . Cancer Father   . Colon cancer Sister   . Stroke Sister   . Cancer Sister     breast  . Heart attack Brother   . Cancer Brother     skin  . Skin cancer Brother   . Cancer Sister     colon    Review of Systems:  Constitutional  Feels well Skin Candidiasis of the inguinal area mons.    Cardiovascular  No chest pain, shortness of breath, or edema  Pulmonary  No cough or wheeze.  Gastro Intestinal  No nausea, vomitting, or diarrhoea. No bright red blood per rectum, no abdominal pain, change in bowel movement, or constipation.  Genito Urinary  No frequency, reports urgency and incontinence, dysuria, c/o perineal discomfort,  Musculo Skeletal  No myalgia, arthralgia, joint swelling or pain  Neurologic  No weakness, numbness, change in gait,  Psychology  No depression, anxiety, insomnia.     Vitals: BP 170/93  Pulse 94  Temp(Src) 99 F (37.2 C) (Oral)  Resp 20  Ht 5' 2.99" (1.6 m)  Wt 231 lb (104.781 kg)  BMI 40.93 kg/m2  Physical Exam: WD female in no acute distress  Neck  Supple without  any enlargements.  Lymph node survey. No cervical supraclavicular cervical or inguinal adenopathy Cardiovascular  Pulse normal rate, regularity and rhythm. S1 and S2 normal. Lungs  Clear to auscultation bilateraly, without wheezes/crackles/rhonchi.  Psychiatry  Alert and oriented to person, place, and time  Back No CVA tenderness Abdomen  Lower bowel sounds, abdomen soft, non-tender and obese. Surgical  sites intact without evidence of hernia. No evidence of a fluid wave or abdominal masses Genito Urinary  Vulva/vagina: Normal external female genitalia.  Skin changes from incontinence  Bladder/urethra:  No lesions or masses  Vagina: Atrophic without any lesions. No palpable masses Rectal  Good tone, no masses no cul de sac nodularity.  Extremities  No bilateral cyanosis, clubbing or edema.    Assessment/Plan:  This is a 67 y.o. year old  who underwent staging for a stage II endometrial cancer and stage I ovarian cancer in June of 2010. The patient completed 6 cycles of Taxol and carboplatin therapy in November of 2010. She completed vaginal cuff brachytherapy in January 2011.  She's been without any evidence of disease since. Her  admission for a partial small bowel obstruction last year  prompted a hernia repair 05/2012.  Her examination today was unremarkable.   No evidence of disease F/U in 6 months Pap collected.  Skin breakdown from urinary incontinence Patient declined ditropan Advised to apply Desitin to the perineum daily.    [name] Schimke, MD., PhD. 04/02/2013, 9:11 AM

## 2013-04-02 NOTE — Patient Instructions (Addendum)
F/U in 6 months  Apply desitin to the vulvar area to protect the skin WiIl call with pap results  Happy Holidays    Thank you very much Ms. Monika Salk for allowing me to provide care for you today.  I appreciate your confidence in choosing our Gynecologic Oncology team.  If you have any questions about your visit today please call our office and we will get back to you as soon as possible.  Maryclare Labrador. Akua Blethen MD., PhD Gynecologic Oncology

## 2013-04-02 NOTE — Addendum Note (Signed)
Addended by: Warner Mccreedy D on: 04/02/2013 02:21 PM   Modules accepted: Orders

## 2013-04-04 ENCOUNTER — Ambulatory Visit: Payer: Medicare Other | Admitting: Gynecologic Oncology

## 2013-04-08 ENCOUNTER — Telehealth: Payer: Self-pay | Admitting: Gynecologic Oncology

## 2013-04-08 NOTE — Telephone Encounter (Signed)
Message left for patient with pap smear results: negative.  Instructed to call for any questions or concerns.  

## 2013-06-05 DIAGNOSIS — E119 Type 2 diabetes mellitus without complications: Secondary | ICD-10-CM | POA: Diagnosis not present

## 2013-06-05 DIAGNOSIS — R609 Edema, unspecified: Secondary | ICD-10-CM | POA: Diagnosis not present

## 2013-06-05 DIAGNOSIS — E7800 Pure hypercholesterolemia, unspecified: Secondary | ICD-10-CM | POA: Diagnosis not present

## 2013-06-13 DIAGNOSIS — D235 Other benign neoplasm of skin of trunk: Secondary | ICD-10-CM | POA: Diagnosis not present

## 2013-06-13 DIAGNOSIS — D1801 Hemangioma of skin and subcutaneous tissue: Secondary | ICD-10-CM | POA: Diagnosis not present

## 2013-06-13 DIAGNOSIS — Z85820 Personal history of malignant melanoma of skin: Secondary | ICD-10-CM | POA: Diagnosis not present

## 2013-07-05 ENCOUNTER — Other Ambulatory Visit: Payer: Self-pay | Admitting: *Deleted

## 2013-07-05 DIAGNOSIS — C4359 Malignant melanoma of other part of trunk: Secondary | ICD-10-CM

## 2013-07-05 DIAGNOSIS — C50919 Malignant neoplasm of unspecified site of unspecified female breast: Secondary | ICD-10-CM

## 2013-07-05 DIAGNOSIS — C569 Malignant neoplasm of unspecified ovary: Secondary | ICD-10-CM

## 2013-07-05 DIAGNOSIS — C55 Malignant neoplasm of uterus, part unspecified: Secondary | ICD-10-CM

## 2013-07-05 MED ORDER — LETROZOLE 2.5 MG PO TABS: 2.5000 mg | ORAL_TABLET | Freq: Every morning | ORAL | Status: DC

## 2013-09-05 ENCOUNTER — Telehealth: Payer: Self-pay | Admitting: Oncology

## 2013-09-05 NOTE — Telephone Encounter (Signed)
kk out - moved 4/30 appt to 5/11 w/LL. lmonvm informing pt and mailed schedule. message left on cell not able to lm on home phone.

## 2013-09-12 ENCOUNTER — Other Ambulatory Visit: Payer: Medicare Other

## 2013-09-12 ENCOUNTER — Ambulatory Visit: Payer: Medicare Other | Admitting: Oncology

## 2013-09-22 ENCOUNTER — Other Ambulatory Visit: Payer: Self-pay | Admitting: Oncology

## 2013-09-22 DIAGNOSIS — C569 Malignant neoplasm of unspecified ovary: Secondary | ICD-10-CM

## 2013-09-22 DIAGNOSIS — C50919 Malignant neoplasm of unspecified site of unspecified female breast: Secondary | ICD-10-CM

## 2013-09-22 DIAGNOSIS — C782 Secondary malignant neoplasm of pleura: Secondary | ICD-10-CM

## 2013-09-23 ENCOUNTER — Other Ambulatory Visit: Payer: Medicare Other

## 2013-09-23 ENCOUNTER — Ambulatory Visit: Payer: Medicare Other | Admitting: Oncology

## 2013-09-25 ENCOUNTER — Telehealth: Payer: Self-pay | Admitting: Oncology

## 2013-09-25 ENCOUNTER — Encounter: Payer: Self-pay | Admitting: Oncology

## 2013-09-25 ENCOUNTER — Ambulatory Visit (HOSPITAL_BASED_OUTPATIENT_CLINIC_OR_DEPARTMENT_OTHER): Payer: Medicare Other | Admitting: Oncology

## 2013-09-25 ENCOUNTER — Other Ambulatory Visit (HOSPITAL_BASED_OUTPATIENT_CLINIC_OR_DEPARTMENT_OTHER): Payer: Medicare Other

## 2013-09-25 VITALS — BP 181/70 | HR 83 | Temp 99.3°F | Resp 17 | Ht 62.99 in | Wt 235.4 lb

## 2013-09-25 DIAGNOSIS — C782 Secondary malignant neoplasm of pleura: Secondary | ICD-10-CM

## 2013-09-25 DIAGNOSIS — C50911 Malignant neoplasm of unspecified site of right female breast: Secondary | ICD-10-CM

## 2013-09-25 DIAGNOSIS — C569 Malignant neoplasm of unspecified ovary: Secondary | ICD-10-CM

## 2013-09-25 DIAGNOSIS — C50919 Malignant neoplasm of unspecified site of unspecified female breast: Secondary | ICD-10-CM | POA: Diagnosis not present

## 2013-09-25 DIAGNOSIS — Z8542 Personal history of malignant neoplasm of other parts of uterus: Secondary | ICD-10-CM

## 2013-09-25 DIAGNOSIS — Z9010 Acquired absence of unspecified breast and nipple: Secondary | ICD-10-CM | POA: Diagnosis not present

## 2013-09-25 DIAGNOSIS — Z79811 Long term (current) use of aromatase inhibitors: Secondary | ICD-10-CM

## 2013-09-25 DIAGNOSIS — J910 Malignant pleural effusion: Secondary | ICD-10-CM

## 2013-09-25 DIAGNOSIS — M25519 Pain in unspecified shoulder: Secondary | ICD-10-CM | POA: Diagnosis not present

## 2013-09-25 DIAGNOSIS — E669 Obesity, unspecified: Secondary | ICD-10-CM | POA: Diagnosis not present

## 2013-09-25 DIAGNOSIS — Z85820 Personal history of malignant melanoma of skin: Secondary | ICD-10-CM | POA: Diagnosis not present

## 2013-09-25 DIAGNOSIS — C50912 Malignant neoplasm of unspecified site of left female breast: Principal | ICD-10-CM

## 2013-09-25 DIAGNOSIS — Z170 Estrogen receptor positive status [ER+]: Secondary | ICD-10-CM | POA: Diagnosis not present

## 2013-09-25 DIAGNOSIS — C4359 Malignant melanoma of other part of trunk: Secondary | ICD-10-CM

## 2013-09-25 DIAGNOSIS — C55 Malignant neoplasm of uterus, part unspecified: Secondary | ICD-10-CM | POA: Diagnosis not present

## 2013-09-25 DIAGNOSIS — Z6841 Body Mass Index (BMI) 40.0 and over, adult: Secondary | ICD-10-CM | POA: Diagnosis not present

## 2013-09-25 LAB — CBC WITH DIFFERENTIAL/PLATELET
BASO%: 0.6 % (ref 0.0–2.0)
Basophils Absolute: 0 10*3/uL (ref 0.0–0.1)
EOS%: 2 % (ref 0.0–7.0)
Eosinophils Absolute: 0.1 10*3/uL (ref 0.0–0.5)
HCT: 41.2 % (ref 34.8–46.6)
HGB: 13.5 g/dL (ref 11.6–15.9)
LYMPH%: 23 % (ref 14.0–49.7)
MCH: 28.9 pg (ref 25.1–34.0)
MCHC: 32.8 g/dL (ref 31.5–36.0)
MCV: 88.2 fL (ref 79.5–101.0)
MONO#: 0.6 10*3/uL (ref 0.1–0.9)
MONO%: 7.8 % (ref 0.0–14.0)
NEUT#: 4.7 10*3/uL (ref 1.5–6.5)
NEUT%: 66.6 % (ref 38.4–76.8)
Platelets: 158 10*3/uL (ref 145–400)
RBC: 4.67 10*6/uL (ref 3.70–5.45)
RDW: 13.8 % (ref 11.2–14.5)
WBC: 7 10*3/uL (ref 3.9–10.3)
lymph#: 1.6 10*3/uL (ref 0.9–3.3)

## 2013-09-25 LAB — COMPREHENSIVE METABOLIC PANEL (CC13)
ALT: 27 U/L (ref 0–55)
AST: 32 U/L (ref 5–34)
Albumin: 3.6 g/dL (ref 3.5–5.0)
Alkaline Phosphatase: 63 U/L (ref 40–150)
Anion Gap: 14 mEq/L — ABNORMAL HIGH (ref 3–11)
BUN: 14.1 mg/dL (ref 7.0–26.0)
CO2: 25 mEq/L (ref 22–29)
Calcium: 9.5 mg/dL (ref 8.4–10.4)
Chloride: 104 mEq/L (ref 98–109)
Creatinine: 0.7 mg/dL (ref 0.6–1.1)
Glucose: 216 mg/dl — ABNORMAL HIGH (ref 70–140)
Potassium: 4.4 mEq/L (ref 3.5–5.1)
Sodium: 143 mEq/L (ref 136–145)
Total Bilirubin: 0.85 mg/dL (ref 0.20–1.20)
Total Protein: 6.5 g/dL (ref 6.4–8.3)

## 2013-09-25 NOTE — Progress Notes (Signed)
OFFICE PROGRESS NOTE   09/25/2013   Physicians: Marcy Panning, [name], [name], [place], [place], [place] Kinard), [facility]  INTERVAL HISTORY:  Patient is seen, alone for visit, in Dr Laurelyn Sickle absence and for first time by this MD, in scheduled follow up of metastatic breast cancer for which she continues letrozole, begun  Dec 2011. 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 Bone density scan 03-13-2013 had normal bone density at spine and hip, with T scores -0.7 and -0.4  She also has history of stage II endometroid endometrial carcinoma and IC endometroid ovarian cancers in 2010, for which she is followed by Dr Skeet Latch every 6 months, next visit 10-08-13.  She has history of melanoma mid upper back, treated with wide local excision by Dr Dalbert Batman 02-2012. Dr Allyson Sabal follows her every 6 months.  She reports that she has done well overall since she was seen last. She has some SOB with exertion not new, but otherwise no respiratory symptoms. Appetite is good and energy is at baseline. She denies abdominal or pelvic discomfort. She denies new or different pain.   She does not have PAC.  Primary care is by Dr [name], with regular appointment upcoming   She had SBO managed conservatively 01-2012, and mesh repair of incarcerated ventral hernia with obstruction in 05-2012.   ONCOLOGIC HISTORY  Bilateral breast cancer treated with bilateral mastectomies and bilateral axillary node dissections by Dr Andrey Campanile in 1997; she recalls that all nodes were negative. She had chemotherapy by Dr Tressie Stalker and bilateral radiation by Dr Danny Lawless. 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 She was begun on letrozole at that time.Note CA 2729 was elevated at time of recurrent  disease.  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 She received 6 cycles adjuvant taxol carboplatin thru Nov 2010 and vaginal cuff brachytherapy completed Jan 2011. Note CA125 was elevated at presentation.  Melanoma upper mid back treated with wide local excision 02-2012 ( I cannnot find that path in EMR )    Review of systems as above, also: No fever or symptoms of infection. No bleeding. Bowels moving regularly. No cardiac symptoms. Occasional sinus HA unchanged. Slight left foot swelling with heel spur known. Soreness left shoulder on rotating arm behind her back. Remainder of 10 point Review of Systems negative.  She had colonoscopy by [facility] 2-3 years ago.   Objective:  Vital signs in last 24 hours:  BP 181/70  Pulse 83  Temp(Src) 99.3 F (37.4 C) (Oral)  Resp 17  Ht 5' 2.99" (1.6 m)  Wt 235 lb 6.4 oz (106.777 kg)  BMI 41.71 kg/m2  SpO2 96% Weight is up 4 lbs.  Alert, oriented and appropriate, very pleasant. Ambulatory without assistance.    HEENT:PERRL, sclerae not icteric. Oral mucosa moist without lesions, posterior pharynx clear.  Neck supple. No JVD.  Lymphatics:no cervical,suraclavicular, axillary or inguinal adenopathy Resp: clear to auscultation bilaterally and normal percussion bilaterally Cardio: regular rate and rhythm. No gallop. GI: abdomen obese, soft, nontender, not obviously distended, no appreciable mass or organomegaly. Normally active bowel sounds. Surgical incision not remarkable. Musculoskeletal/ Extremities: without pitting edema, cords, tenderness. No obvious swelling or tenderness left shoulder or along humeral shaft. Neuro: speech fluent, CN intact, motor/cerebellar/sensory nonfocal Skin without rash, ecchymosis,  petechiae. Well healed rectangular scar mid back without concern for recurrent melanoma. Bilateral mastectomy scars without evidence of local recurrence,  telangiectasias consistent with prior RT. Axillae benign.   Lab Results:  Results for orders placed in visit on 09/25/13  CBC WITH DIFFERENTIAL      Result Value Ref Range   WBC 7.0  3.9 - 10.3 10e3/uL   NEUT# 4.7  1.5 - 6.5 10e3/uL   HGB 13.5  11.6 - 15.9 g/dL   HCT 41.2  34.8 - 46.6 %   Platelets 158  145 - 400 10e3/uL   MCV 88.2  79.5 - 101.0 fL   MCH 28.9  25.1 - 34.0 pg   MCHC 32.8  31.5 - 36.0 g/dL   RBC 4.67  3.70 - 5.45 10e6/uL   RDW 13.8  11.2 - 14.5 %   lymph# 1.6  0.9 - 3.3 10e3/uL   MONO# 0.6  0.1 - 0.9 10e3/uL   Eosinophils Absolute 0.1  0.0 - 0.5 10e3/uL   Basophils Absolute 0.0  0.0 - 0.1 10e3/uL   NEUT% 66.6  38.4 - 76.8 %   LYMPH% 23.0  14.0 - 49.7 %   MONO% 7.8  0.0 - 14.0 %   EOS% 2.0  0.0 - 7.0 %   BASO% 0.6  0.0 - 2.0 %  COMPREHENSIVE METABOLIC PANEL (JQ30)      Result Value Ref Range   Sodium 143  136 - 145 mEq/L   Potassium 4.4  3.5 - 5.1 mEq/L   Chloride 104  98 - 109 mEq/L   CO2 25  22 - 29 mEq/L   Glucose 216 (*) 70 - 140 mg/dl   BUN 14.1  7.0 - 26.0 mg/dL   Creatinine 0.7  0.6 - 1.1 mg/dL   Total Bilirubin 0.85  0.20 - 1.20 mg/dL   Alkaline Phosphatase 63  40 - 150 U/L   AST 32  5 - 34 U/L   ALT 27  0 - 55 U/L   Total Protein 6.5  6.4 - 8.3 g/dL   Albumin 3.6  3.5 - 5.0 g/dL   Calcium 9.5  8.4 - 10.4 mg/dL   Anion Gap 14 (*) 3 - 11 mEq/L     CA125 available after visit  5.8, this having been 5.9 in 06-2012  CA 2729 available after visit 33, this having been 30 in 06-2012 and 71 in 04-2010   Studies/Results:  Last CT chest 02-20-2013   DUAL X-RAY ABSORPTIOMETRY (DXA) FOR BONE MINERAL DENSITY 03-13-2013 LEFT FEMUR (NECK)  Bone Mineral Density (BMD): 0.769 g/cm2  Young Adult T Score: -0.7  Z Score: 1.0  LEFT FOREARM (1/3 RADIUS)  Bone Mineral Density (BMD): 0.670 g/cm2  Young Adult T Score: -0.4  Z Score: 1.6  ASSESSMENT: Patient's diagnostic category is NORMAL by WHO  Criteria.  FRACTURE RISK: NOT INCREASED  FRAX: Not  applicable  Comparison: There has been a statistically significant 3.7%  decrease in the total left hip and no significant change in BMD in  the left forearm as compared to 07/14/2010.   Medications: I have reviewed the patient's current medications. She is in full agreement with continuing letrozole. She does take calcium with D twice daily.    Assessment/Plan:  1.Bilateral breast cancers 1997 treated with bilateral mastectomies and axillary node dissections, adjuvant chemotherapy and radiation. 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 On Letrozole since ~ 04-2010, tolerating well and clinically stable. With  multiple other doctors following will see her back at this office in 6 months, or sooner if needed. CA 2729 stable at 33. 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 She has 6 mo follow up with Dr Skeet Latch in late May. CA 125 stable at 5.8. 3. Melanoma upper back 2013, treated with wide excision. Followed by dermatology. 4.normal bone density 02-2013. Suggest repeating 02-2015, but have also encouraged her to continue good calcium with D and weight bearing exercise as she is being treated with aromatase inhibitor. 5. Complaints of soreness left shoulder in certain positions: following visit I found information in CT chest report 02-2013 of sclerotic lesion in left humeral head, then 1.1 cm compared with 0.9 cm 2011. Will be back in touch with patient to suggest xrays left humerus. 6.obesity, BMI 41  Time spent 25 min, >50% counseling and coordination of care.  Gordy Levan, MD   09/25/2013, 1:34 PM

## 2013-09-25 NOTE — Telephone Encounter (Signed)
per pof to sch pt appt-gave pt copy of sch °

## 2013-09-26 LAB — CANCER ANTIGEN 27.29: CA 27.29: 33 U/mL (ref 0–39)

## 2013-09-26 LAB — CA 125: CA 125: 5.8 U/mL (ref 0.0–30.2)

## 2013-09-27 ENCOUNTER — Other Ambulatory Visit: Payer: Self-pay | Admitting: Oncology

## 2013-09-27 DIAGNOSIS — J910 Malignant pleural effusion: Secondary | ICD-10-CM

## 2013-09-27 DIAGNOSIS — C50912 Malignant neoplasm of unspecified site of left female breast: Secondary | ICD-10-CM

## 2013-09-27 DIAGNOSIS — C50911 Malignant neoplasm of unspecified site of right female breast: Secondary | ICD-10-CM

## 2013-09-30 ENCOUNTER — Encounter: Payer: Self-pay | Admitting: *Deleted

## 2013-09-30 ENCOUNTER — Telehealth: Payer: Self-pay | Admitting: *Deleted

## 2013-09-30 NOTE — Telephone Encounter (Signed)
Pt notified to have xray of shoulder. To go to [facility] as walk-in. Order is in for xray

## 2013-09-30 NOTE — Telephone Encounter (Signed)
Left message to call.

## 2013-09-30 NOTE — Telephone Encounter (Signed)
Message copied by Patton Salles on Mon Sep 30, 2013  8:55 AM ------      Message from: Gordy Levan      Created: Fri Sep 27, 2013 10:07 PM       Please let her know that, after her visit, I saw comment in CT chest from 02-2013 about an area they were watching in the left shouder. Since she is having more discomfort there now, I would like her to have xray of her left shoulder.      I have put the order into EMR, but she needs to be told about it as above.       Need to be sure we let her know results of xray      thanks      Cc Mitzi Hansen ------

## 2013-10-03 ENCOUNTER — Other Ambulatory Visit: Payer: Self-pay | Admitting: Oncology

## 2013-10-03 ENCOUNTER — Ambulatory Visit (HOSPITAL_COMMUNITY)
Admission: RE | Admit: 2013-10-03 | Discharge: 2013-10-03 | Disposition: A | Discharge status: Home / Self Care | Payer: Medicare Other | Source: Ambulatory Visit | Attending: Oncology | Admitting: Oncology

## 2013-10-03 DIAGNOSIS — M79609 Pain in unspecified limb: Secondary | ICD-10-CM | POA: Diagnosis not present

## 2013-10-03 DIAGNOSIS — C50919 Malignant neoplasm of unspecified site of unspecified female breast: Secondary | ICD-10-CM | POA: Diagnosis not present

## 2013-10-03 DIAGNOSIS — J910 Malignant pleural effusion: Secondary | ICD-10-CM

## 2013-10-03 DIAGNOSIS — C50912 Malignant neoplasm of unspecified site of left female breast: Secondary | ICD-10-CM

## 2013-10-03 DIAGNOSIS — C50911 Malignant neoplasm of unspecified site of right female breast: Secondary | ICD-10-CM

## 2013-10-03 DIAGNOSIS — M25519 Pain in unspecified shoulder: Secondary | ICD-10-CM | POA: Diagnosis not present

## 2013-10-04 ENCOUNTER — Telehealth: Payer: Self-pay | Admitting: *Deleted

## 2013-10-04 NOTE — Telephone Encounter (Signed)
Message copied by Patton Salles on Fri Oct 04, 2013  2:29 PM ------      Message from: Gordy Levan      Created: Fri Oct 04, 2013  2:21 PM       Labs seen and need follow up: please let her know no problem we can tell with bones on this plain Xray. She should let Dr [name] know how it is at next visit there, and please also let this office know if worse prior to next visit here. ------

## 2013-10-04 NOTE — Telephone Encounter (Signed)
Left message with results below

## 2013-10-04 NOTE — Telephone Encounter (Signed)
Pt called her husband is in the hospital and insurance will not cover her visit until after jul1. Pt cancelled appt 5/26 r/s for 7/9.

## 2013-10-08 ENCOUNTER — Ambulatory Visit: Payer: Medicare Other | Admitting: Gynecologic Oncology

## 2013-10-10 ENCOUNTER — Other Ambulatory Visit: Payer: Self-pay | Admitting: Family Medicine

## 2013-10-10 ENCOUNTER — Ambulatory Visit
Admission: RE | Admit: 2013-10-10 | Discharge: 2013-10-10 | Disposition: A | Discharge status: Home / Self Care | Payer: Medicare Other | Source: Ambulatory Visit | Attending: Family Medicine | Admitting: Family Medicine

## 2013-10-10 ENCOUNTER — Ambulatory Visit: Payer: Medicare Other | Admitting: Gynecologic Oncology

## 2013-10-10 DIAGNOSIS — R609 Edema, unspecified: Secondary | ICD-10-CM

## 2013-10-10 DIAGNOSIS — M79609 Pain in unspecified limb: Secondary | ICD-10-CM | POA: Diagnosis not present

## 2013-10-10 DIAGNOSIS — M7989 Other specified soft tissue disorders: Secondary | ICD-10-CM | POA: Diagnosis not present

## 2013-10-10 DIAGNOSIS — R52 Pain, unspecified: Secondary | ICD-10-CM

## 2013-10-11 DIAGNOSIS — R609 Edema, unspecified: Secondary | ICD-10-CM | POA: Diagnosis not present

## 2013-10-11 DIAGNOSIS — M25569 Pain in unspecified knee: Secondary | ICD-10-CM | POA: Diagnosis not present

## 2013-10-12 DIAGNOSIS — M25569 Pain in unspecified knee: Secondary | ICD-10-CM | POA: Diagnosis not present

## 2013-10-30 ENCOUNTER — Other Ambulatory Visit: Payer: Self-pay | Admitting: Oncology

## 2013-10-31 DIAGNOSIS — IMO0002 Reserved for concepts with insufficient information to code with codable children: Secondary | ICD-10-CM | POA: Diagnosis not present

## 2013-11-04 DIAGNOSIS — E7800 Pure hypercholesterolemia, unspecified: Secondary | ICD-10-CM | POA: Diagnosis not present

## 2013-11-04 DIAGNOSIS — E119 Type 2 diabetes mellitus without complications: Secondary | ICD-10-CM | POA: Diagnosis not present

## 2013-11-04 DIAGNOSIS — R609 Edema, unspecified: Secondary | ICD-10-CM | POA: Diagnosis not present

## 2013-11-11 DIAGNOSIS — M1710 Unilateral primary osteoarthritis, unspecified knee: Secondary | ICD-10-CM | POA: Diagnosis not present

## 2013-11-21 ENCOUNTER — Ambulatory Visit: Payer: Medicare Other | Attending: Gynecologic Oncology | Admitting: Gynecologic Oncology

## 2013-11-21 ENCOUNTER — Encounter: Payer: Self-pay | Admitting: Gynecologic Oncology

## 2013-11-21 VITALS — BP 155/76 | Temp 99.7°F | Resp 16 | Ht 63.0 in | Wt 230.7 lb

## 2013-11-21 DIAGNOSIS — E785 Hyperlipidemia, unspecified: Secondary | ICD-10-CM | POA: Diagnosis not present

## 2013-11-21 DIAGNOSIS — C50919 Malignant neoplasm of unspecified site of unspecified female breast: Secondary | ICD-10-CM | POA: Insufficient documentation

## 2013-11-21 DIAGNOSIS — E119 Type 2 diabetes mellitus without complications: Secondary | ICD-10-CM | POA: Insufficient documentation

## 2013-11-21 DIAGNOSIS — N952 Postmenopausal atrophic vaginitis: Secondary | ICD-10-CM

## 2013-11-21 DIAGNOSIS — Z8543 Personal history of malignant neoplasm of ovary: Secondary | ICD-10-CM

## 2013-11-21 DIAGNOSIS — C549 Malignant neoplasm of corpus uteri, unspecified: Secondary | ICD-10-CM | POA: Diagnosis not present

## 2013-11-21 DIAGNOSIS — C569 Malignant neoplasm of unspecified ovary: Secondary | ICD-10-CM | POA: Insufficient documentation

## 2013-11-21 DIAGNOSIS — Z90710 Acquired absence of both cervix and uterus: Secondary | ICD-10-CM | POA: Insufficient documentation

## 2013-11-21 DIAGNOSIS — Z8542 Personal history of malignant neoplasm of other parts of uterus: Secondary | ICD-10-CM

## 2013-11-21 NOTE — Progress Notes (Signed)
Gyn-Onc: Follow-up Note:   Brittany Porter 68 y.o.[place] female   CC:  Endometrial cancer surveillance, ovarian cancer surveillance.   HPI:  Endometrial and ovarian cancer surveillance.  This is a 68 y.o. . who on May 16, 2008, underwent operative staging of presumed endometrial adenocarcinoma. Intraoperative findings were also notable for an endometrioid cancer of the right ovary. The procedure that performed at  that time was a total abdominal hysterectomy, bilateral salpingo- oophorectomy, omentectomy, appendectomy, and umbilical hernia repair. 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 She subsequently received 6 cycles of Taxol and carboplatin therapy. Last cycle administered in November 2010. She underwent vaginal cuff brachytherapy for stage II endometrial cancer. She has completed in January 2011. Recurrence of her breast cancer was identified earlier this year and she is on letrozole therapy under the care of Dr. Marko Plume  Interval History: SBO last year was managed conservatively. She underwent Lsc hernia repair 05/2012 by Dr. Dalbert Batman. Pap test in 03/2013 was within normal limits   Past Medical History  Diagnosis Date  . Pleural effusion 04/2010  . Breast cancer   . Ovarian cancer   . Endometrial carcinoma 10/2008  . Iron deficiency   . Diabetes mellitus   . Hyperlipidemia   . Fluid overload   . Arthritis   . History of radiation therapy 04/13/2009, 04/16/2009, 04/27/2009, 05/07/2009, 05/18/2009    3000 cGy to proximal vagina   Past Surgical History  Procedure Laterality Date  . Abdominal hysterectomy  10/2008  . Melanoma removal  02/2009, 07/2009  . Knee arthroscopy  04/2010    Right knee  . Mastectomy  1997    Bilateral with lymph nodes  . Ventral hernia repair  05/28/2012    Procedure: LAPAROSCOPIC VENTRAL HERNIA;  Surgeon: Adin Hector, MD;  Location: [facility];  Service: General;  Laterality: N/A;  . Insertion of  mesh  05/28/2012    Procedure: INSERTION OF MESH;  Surgeon: Adin Hector, MD;  Location: [facility];  Service: General;  Laterality: N/A;  . Laparoscopic lysis of adhesions  05/28/2012    Procedure: LAPAROSCOPIC LYSIS OF ADHESIONS;  Surgeon: Adin Hector, MD;  Location: [facility];  Service: General;  Laterality: N/A;    Family Hx:  Family History       Problem    Relation  Age of Onset   .  Stroke   Mother    .  Cancer   Father    .  Colon cancer   Sister    .  Stroke   Sister    .  Cancer   Sister      breast     .  Heart attack   Brother    .  Cancer   Brother      skin     .  Skin cancer   Brother    .  Cancer   Sister      colon       Review of Systems:  Constitutional  Feels well  Skin  Candidiasis of the inguinal area mons.  Cardiovascular  No chest pain, shortness of breath, or edema  Pulmonary  No cough or wheeze.  Gastro Intestinal  No nausea, vomitting, or diarrhoea. No bright red blood per rectum, no abdominal pain, change in bowel movement, or constipation.  Genito Urinary  No frequency, reports urgency and incontinence, dysuria,  Musculo Skeletal  No myalgia, reports worsening arthralgia and  joint pain. Neurologic  No weakness, numbness, change in gait,  Psychology  No depression, anxiety, insomnia.   Vitals: BP 155/76  Temp(Src) 99.7 F (37.6 C) (Oral)  Resp 16  Ht 5\' 3"  (1.6 m)  Wt 230 lb 11.2 oz (104.645 kg)  BMI 40.88 kg/m2  Physical Exam:  WD female in no acute distress  Neck  Supple without any enlargements.  Lymph node survey.  No cervical supraclavicular cervical or inguinal adenopathy  Cardiovascular  Pulse normal rate, regularity and rhythm.   Chest: Clear to auscultation bilateraly Psychiatry  Alert and oriented to person, place, and time  Back  No CVA tenderness  Abdomen  Lower bowel sounds, abdomen soft, non-tender and obese. Surgical sites intact without evidence of hernia. No evidence of a fluid wave or abdominal masses   Genito Urinary  Vulva/vagina: Normal external female genitalia. Skin changes from incontinence  Bladder/urethra: No lesions or masses  Vagina: Atrophic without any lesions.grade 1 rectocele, atrophic vagina with non odorous discharge  Rectal  Good tone, no masses no cul de sac nodularity.  Extremities  No bilateral cyanosis, clubbing or edema.    Assessment/Plan:  This is a 68 y.o. who underwent staging for a stage II endometrial cancer and stage I ovarian cancer in June of 2010. The patient completed 6 cycles of Taxol and carboplatin therapy in November of 2010. She completed vaginal cuff brachytherapy in January 2011. She's been without any evidence of disease since. Her admission for a partial small bowel obstruction 2013  prompted a hernia repair 05/2012. Her examination today was remarkable only for sever atrophic vaginitis. No evidence of disease since 05/2009  F/U in 6 months

## 2013-11-22 DIAGNOSIS — M1710 Unilateral primary osteoarthritis, unspecified knee: Secondary | ICD-10-CM | POA: Diagnosis not present

## 2013-11-29 DIAGNOSIS — M1710 Unilateral primary osteoarthritis, unspecified knee: Secondary | ICD-10-CM | POA: Diagnosis not present

## 2013-12-11 DIAGNOSIS — M1710 Unilateral primary osteoarthritis, unspecified knee: Secondary | ICD-10-CM | POA: Diagnosis not present

## 2013-12-20 DIAGNOSIS — M1710 Unilateral primary osteoarthritis, unspecified knee: Secondary | ICD-10-CM | POA: Diagnosis not present

## 2013-12-24 DIAGNOSIS — L821 Other seborrheic keratosis: Secondary | ICD-10-CM | POA: Diagnosis not present

## 2013-12-24 DIAGNOSIS — D235 Other benign neoplasm of skin of trunk: Secondary | ICD-10-CM | POA: Diagnosis not present

## 2013-12-24 DIAGNOSIS — D1801 Hemangioma of skin and subcutaneous tissue: Secondary | ICD-10-CM | POA: Diagnosis not present

## 2013-12-24 DIAGNOSIS — Z85820 Personal history of malignant melanoma of skin: Secondary | ICD-10-CM | POA: Diagnosis not present

## 2013-12-26 DIAGNOSIS — H2510 Age-related nuclear cataract, unspecified eye: Secondary | ICD-10-CM | POA: Diagnosis not present

## 2014-01-13 ENCOUNTER — Other Ambulatory Visit: Payer: Self-pay | Admitting: Oncology

## 2014-01-13 DIAGNOSIS — C50919 Malignant neoplasm of unspecified site of unspecified female breast: Secondary | ICD-10-CM

## 2014-01-17 DIAGNOSIS — M25569 Pain in unspecified knee: Secondary | ICD-10-CM | POA: Diagnosis not present

## 2014-02-04 DIAGNOSIS — IMO0001 Reserved for inherently not codable concepts without codable children: Secondary | ICD-10-CM | POA: Diagnosis not present

## 2014-02-04 DIAGNOSIS — Z23 Encounter for immunization: Secondary | ICD-10-CM | POA: Diagnosis not present

## 2014-03-23 ENCOUNTER — Other Ambulatory Visit: Payer: Self-pay | Admitting: Oncology

## 2014-03-23 DIAGNOSIS — C50911 Malignant neoplasm of unspecified site of right female breast: Secondary | ICD-10-CM

## 2014-03-23 DIAGNOSIS — C50912 Malignant neoplasm of unspecified site of left female breast: Principal | ICD-10-CM

## 2014-03-23 DIAGNOSIS — C569 Malignant neoplasm of unspecified ovary: Secondary | ICD-10-CM

## 2014-03-23 DIAGNOSIS — J910 Malignant pleural effusion: Secondary | ICD-10-CM

## 2014-03-26 ENCOUNTER — Other Ambulatory Visit (HOSPITAL_BASED_OUTPATIENT_CLINIC_OR_DEPARTMENT_OTHER): Payer: Medicare Other

## 2014-03-26 ENCOUNTER — Ambulatory Visit (HOSPITAL_BASED_OUTPATIENT_CLINIC_OR_DEPARTMENT_OTHER): Payer: Medicare Other | Admitting: Oncology

## 2014-03-26 ENCOUNTER — Encounter: Payer: Self-pay | Admitting: Oncology

## 2014-03-26 ENCOUNTER — Telehealth: Payer: Self-pay | Admitting: Oncology

## 2014-03-26 VITALS — BP 163/58 | HR 77 | Temp 98.7°F | Resp 18 | Ht 63.0 in | Wt 227.2 lb

## 2014-03-26 DIAGNOSIS — C55 Malignant neoplasm of uterus, part unspecified: Secondary | ICD-10-CM

## 2014-03-26 DIAGNOSIS — C569 Malignant neoplasm of unspecified ovary: Secondary | ICD-10-CM | POA: Diagnosis not present

## 2014-03-26 DIAGNOSIS — C50911 Malignant neoplasm of unspecified site of right female breast: Secondary | ICD-10-CM

## 2014-03-26 DIAGNOSIS — J910 Malignant pleural effusion: Secondary | ICD-10-CM

## 2014-03-26 DIAGNOSIS — Z85820 Personal history of malignant melanoma of skin: Secondary | ICD-10-CM

## 2014-03-26 DIAGNOSIS — C541 Malignant neoplasm of endometrium: Secondary | ICD-10-CM

## 2014-03-26 DIAGNOSIS — C50912 Malignant neoplasm of unspecified site of left female breast: Secondary | ICD-10-CM | POA: Diagnosis not present

## 2014-03-26 LAB — CBC WITH DIFFERENTIAL/PLATELET
BASO%: 0.8 % (ref 0.0–2.0)
Basophils Absolute: 0.1 10*3/uL (ref 0.0–0.1)
EOS%: 2.6 % (ref 0.0–7.0)
Eosinophils Absolute: 0.2 10*3/uL (ref 0.0–0.5)
HCT: 42.9 % (ref 34.8–46.6)
HGB: 14.1 g/dL (ref 11.6–15.9)
LYMPH%: 28.1 % (ref 14.0–49.7)
MCH: 29.4 pg (ref 25.1–34.0)
MCHC: 32.9 g/dL (ref 31.5–36.0)
MCV: 89.4 fL (ref 79.5–101.0)
MONO#: 0.4 10*3/uL (ref 0.1–0.9)
MONO%: 7.1 % (ref 0.0–14.0)
NEUT#: 3.7 10*3/uL (ref 1.5–6.5)
NEUT%: 61.4 % (ref 38.4–76.8)
Platelets: 163 10*3/uL (ref 145–400)
RBC: 4.8 10*6/uL (ref 3.70–5.45)
RDW: 13.6 % (ref 11.2–14.5)
WBC: 6 10*3/uL (ref 3.9–10.3)
lymph#: 1.7 10*3/uL (ref 0.9–3.3)

## 2014-03-26 LAB — COMPREHENSIVE METABOLIC PANEL (CC13)
ALT: 27 U/L (ref 0–55)
AST: 37 U/L — ABNORMAL HIGH (ref 5–34)
Albumin: 3.5 g/dL (ref 3.5–5.0)
Alkaline Phosphatase: 64 U/L (ref 40–150)
Anion Gap: 8 mEq/L (ref 3–11)
BUN: 8.4 mg/dL (ref 7.0–26.0)
CO2: 31 mEq/L — ABNORMAL HIGH (ref 22–29)
Calcium: 9.3 mg/dL (ref 8.4–10.4)
Chloride: 103 mEq/L (ref 98–109)
Creatinine: 0.7 mg/dL (ref 0.6–1.1)
Glucose: 269 mg/dl — ABNORMAL HIGH (ref 70–140)
Potassium: 4.4 mEq/L (ref 3.5–5.1)
Sodium: 142 mEq/L (ref 136–145)
Total Bilirubin: 0.84 mg/dL (ref 0.20–1.20)
Total Protein: 6.5 g/dL (ref 6.4–8.3)

## 2014-03-26 NOTE — Progress Notes (Signed)
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 Patient has prn follow up with Dr Dalbert Batman and Dr Sondra Come. She had bilateral mastectomies 1997 so no mammograms. Last bone density scan 03-13-2013 had normal bone density such that this will not be repeated this year even on the letrozole.She continues calcium and D. She saw Dr Skeet Latch 11-21-13 and will see her again in 6 months. She is followed every 6 months by Dr Allyson Sabal. She is to see PCP Dr Tamala Julian next in Feb 2016  Patient denies increased SOB, cough, chest pain. She has no arthralgia symptoms in hands or feet, with stable preexisting arthritis symptoms shoulders, upper arms, knees and hips. She denies abdominal or pelvic pain and has no bleeding. She is not aware of any new or different skin lesions.   She does not have PAC She had flu vaccine Message to genetics counselors as I am not sure if genetics testing has been done.    ONCOLOGIC HISTORY Bilateral breast cancer treated with bilateral mastectomies and bilateral axillary node dissections by Dr Andrey Campanile in 1997; she recalls that all nodes were negative. She had chemotherapy by Dr Tressie Stalker and bilateral radiation by Dr Danny Lawless. 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 She was begun on letrozole at that time.Note CA  2729 was elevated at time of recurrent disease.  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 She received 6 cycles adjuvant taxol carboplatin thru Nov 2010 and vaginal cuff brachytherapy completed Jan 2011. Note CA125 was elevated at presentation.  Melanoma upper mid back treated with wide local excision 02-2012 ( I cannnot find that path in EMR )  Review of systems as above, also: Good ROM of shoulders but some tightness left anterior axillary muscles unchanged. Backs of legs uncomfortable when climbs stairs. Bowels ok. Intentionally cutting down on food portions for ~ last 3 months. Remainder of 10 point Review of Systems negative.  Objective:  Vital signs in last 24 hours:  BP 163/58 mmHg  Pulse 77  Temp(Src) 98.7 F (37.1 C) (Oral)  Resp 18  Ht '5\' 3"'  (1.6 m)  Wt 227 lb 3.2 oz (103.057 kg)  BMI 40.26 kg/m2 Obese Weight down 8 lbs from 09-2013, intentional. Respirations not labored RA Alert, oriented and appropriate. Ambulatory without assistance. No alopecia  HEENT:PERRL, sclerae not icteric. Oral mucosa moist without lesions, posterior pharynx clear.  Neck supple. No JVD.  Lymphatics:no cervical,suraclavicular, axillary adenopathy Resp: clear to auscultation bilaterally and normal percussion bilaterally Cardio: regular rate and rhythm. No gallop. GI: abdomen obese, soft, nontender, not distended, cannot appreciate  mass or organomegaly. Normally active bowel sounds. Surgical incision not remarkable. Musculoskeletal/ Extremities: without pitting edema, cords, tenderness. Easy full ROM bilateral shoulders. No tenderness to palpation upper arms. Neuro: speech fluent, moves all extremities equally, no focal deficits. PSYCH appropriate mood and affect Skin  without rash, ecchymosis, petechiae. Well healed surgical incision upper mid back without skin changes of concern there. Breasts: bilateral mastectomy scars with  deep tissue folds no evidence of local recurrence, telangiectasia in radiation areas bilaterally. Axillae benign.   Lab Results:  Results for orders placed or performed in visit on 03/26/14  CBC with Differential  Result Value Ref Range   WBC 6.0 3.9 - 10.3 10e3/uL   NEUT# 3.7 1.5 - 6.5 10e3/uL   HGB 14.1 11.6 - 15.9 g/dL   HCT 42.9 34.8 - 46.6 %   Platelets 163 145 - 400 10e3/uL   MCV 89.4 79.5 - 101.0 fL   MCH 29.4 25.1 - 34.0 pg   MCHC 32.9 31.5 - 36.0 g/dL   RBC 4.80 3.70 - 5.45 10e6/uL   RDW 13.6 11.2 - 14.5 %   lymph# 1.7 0.9 - 3.3 10e3/uL   MONO# 0.4 0.1 - 0.9 10e3/uL   Eosinophils Absolute 0.2 0.0 - 0.5 10e3/uL   Basophils Absolute 0.1 0.0 - 0.1 10e3/uL   NEUT% 61.4 38.4 - 76.8 %   LYMPH% 28.1 14.0 - 49.7 %   MONO% 7.1 0.0 - 14.0 %   EOS% 2.6 0.0 - 7.0 %   BASO% 0.8 0.0 - 2.0 %    CMET available after visit normal with exception of glucose 269, CO2 31, AST 37 with other LFTs normal.  CA125 available after visit 8  CA2729 available after visit WNL at 32  Studies/Results:  No results found. bone density 03-13-2013 normal, will repeat 2016. CT AP 04-2012; CT chest 02-2013. Plain xrays left shoulder and humerus 09-2013 nothing of concern for metastatic disease.  Medications: I have reviewed the patient's current medications. She uses NSAID for arthritis since insurance no longer covers hydrocodone/ oxycodone without significant copay.  DISCUSSION: patient asking about side effects of letrozole, which we have discussed now, including reversible arthralgias usually starting hands and feet, elevated lipids (followed by PCP), decreased bone density (as above). She understands and agrees with plan to continue letrozole as long as she is tolerating this and as long as no progression of the metastatic breast cancer.  Assessment/Plan:  1.Bilateral breast cancers 1997 treated with bilateral mastectomies and axillary node dissections, adjuvant chemotherapy and radiation.  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 On Letrozole since ~ 04-2010, tolerating well and clinically stable. With multiple other doctors following will see her back at this office in 6 months, or sooner if needed. CA 2729 stable at 30 2.Stage II endometrial carcinoma and IC ovarian carcinoma diagnosed Jan 2010, with surgery followed by 6 cycles taxol carboplatin thru 03-2009 and brachytherapy. She has 6 mo follow up with Dr Skeet Latch in late May. CA 125 stable at 8 3. Melanoma upper back 2013, treated with wide excision. Followed by dermatology. 4.normal bone density 02-2013. Suggest repeating 02-2015, but have also encouraged her to continue good calcium with D and weight bearing exercise as she is being treated with aromatase inhibitor. 5. CT chest report 02-2013 of sclerotic lesion in left humeral head, then 1.1 cm compared with 0.9 cm 2011. Plain xrays not remarkable 09-2013, no worsening of symptoms 6.obesity, BMI 40. Encouraged her to continue dietary interventions, reminded her that weight loss will help arthritis symptoms in hips and knees  Patient understands discussion and is in agreement with plans above. I will see her back with labs in 6 months, or certainly sooner if needed. Time spent 25 min including >  50% counseling and coordination of care. Cc this note Drs Tamala Julian and Allyson Sabal.   LIVESAY,[name], MD   03/26/2014, 10:18 AM

## 2014-03-26 NOTE — Telephone Encounter (Signed)
per pof to sch pt appt-[facility] not opened for 09/2014-adv we would sch & call-gave pof to Anne to sch

## 2014-03-27 ENCOUNTER — Telehealth: Payer: Self-pay

## 2014-03-27 LAB — CA 125(PREVIOUS METHOD): CA 125: 5.7 U/mL (ref 0.0–30.2)

## 2014-03-27 LAB — CANCER ANTIGEN 27.29: CA 27.29: 32 U/mL (ref 0–39)

## 2014-03-27 LAB — CA 125: CA 125: 8 U/mL (ref <35)

## 2014-03-27 NOTE — Telephone Encounter (Signed)
Left message in home answering machine to call Dr. Mariana Kaufman nurse for lab results from 03-26-14.

## 2014-03-27 NOTE — Telephone Encounter (Signed)
Spoke with Brittany Porter and gave her the results of the Tumor markers as noted below by Dr. Marko Plume.   Brittany Porter verbalized understanding.

## 2014-03-27 NOTE — Telephone Encounter (Signed)
-----   Message from Gordy Levan, MD sent at 03/27/2014 10:03 AM EST ----- Labs seen and need follow up: please let her know that both cancer markers (ca125 and ca2729) were in good low range on 03-26-14.  thanks

## 2014-04-18 ENCOUNTER — Encounter (HOSPITAL_COMMUNITY): Payer: Self-pay | Admitting: Emergency Medicine

## 2014-04-18 ENCOUNTER — Emergency Department (HOSPITAL_COMMUNITY)
Admission: EM | Admit: 2014-04-18 | Discharge: 2014-04-18 | Disposition: A | Discharge status: Home / Self Care | Payer: Medicare Other | Attending: Emergency Medicine | Admitting: Emergency Medicine

## 2014-04-18 DIAGNOSIS — W1839XA Other fall on same level, initial encounter: Secondary | ICD-10-CM | POA: Insufficient documentation

## 2014-04-18 DIAGNOSIS — Z8542 Personal history of malignant neoplasm of other parts of uterus: Secondary | ICD-10-CM | POA: Diagnosis not present

## 2014-04-18 DIAGNOSIS — S39011A Strain of muscle, fascia and tendon of abdomen, initial encounter: Secondary | ICD-10-CM | POA: Diagnosis not present

## 2014-04-18 DIAGNOSIS — M1990 Unspecified osteoarthritis, unspecified site: Secondary | ICD-10-CM | POA: Insufficient documentation

## 2014-04-18 DIAGNOSIS — Y9289 Other specified places as the place of occurrence of the external cause: Secondary | ICD-10-CM | POA: Diagnosis not present

## 2014-04-18 DIAGNOSIS — S76219A Strain of adductor muscle, fascia and tendon of unspecified thigh, initial encounter: Secondary | ICD-10-CM

## 2014-04-18 DIAGNOSIS — E119 Type 2 diabetes mellitus without complications: Secondary | ICD-10-CM | POA: Insufficient documentation

## 2014-04-18 DIAGNOSIS — Z79899 Other long term (current) drug therapy: Secondary | ICD-10-CM | POA: Diagnosis not present

## 2014-04-18 DIAGNOSIS — Z8543 Personal history of malignant neoplasm of ovary: Secondary | ICD-10-CM | POA: Diagnosis not present

## 2014-04-18 DIAGNOSIS — S39012A Strain of muscle, fascia and tendon of lower back, initial encounter: Secondary | ICD-10-CM | POA: Diagnosis not present

## 2014-04-18 DIAGNOSIS — Z923 Personal history of irradiation: Secondary | ICD-10-CM | POA: Insufficient documentation

## 2014-04-18 DIAGNOSIS — E611 Iron deficiency: Secondary | ICD-10-CM | POA: Insufficient documentation

## 2014-04-18 DIAGNOSIS — Z8709 Personal history of other diseases of the respiratory system: Secondary | ICD-10-CM | POA: Diagnosis not present

## 2014-04-18 DIAGNOSIS — Z853 Personal history of malignant neoplasm of breast: Secondary | ICD-10-CM | POA: Insufficient documentation

## 2014-04-18 DIAGNOSIS — Y998 Other external cause status: Secondary | ICD-10-CM | POA: Diagnosis not present

## 2014-04-18 DIAGNOSIS — E785 Hyperlipidemia, unspecified: Secondary | ICD-10-CM | POA: Diagnosis not present

## 2014-04-18 DIAGNOSIS — Y9389 Activity, other specified: Secondary | ICD-10-CM | POA: Diagnosis not present

## 2014-04-18 DIAGNOSIS — S3991XA Unspecified injury of abdomen, initial encounter: Secondary | ICD-10-CM | POA: Diagnosis present

## 2014-04-18 MED ORDER — TRAMADOL HCL 50 MG PO TABS
50.0000 mg | ORAL_TABLET | Freq: Once | ORAL | Status: AC
  Administered 2014-04-18: 50 mg via ORAL

## 2014-04-18 MED ORDER — IBUPROFEN 200 MG PO TABS
400.0000 mg | ORAL_TABLET | Freq: Once | ORAL | Status: AC
  Administered 2014-04-18: 400 mg via ORAL
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
## 2014-04-18 NOTE — Discharge Instructions (Signed)

## 2014-04-25 DIAGNOSIS — N390 Urinary tract infection, site not specified: Secondary | ICD-10-CM | POA: Diagnosis not present
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
## 2014-05-02 ENCOUNTER — Telehealth: Payer: Self-pay | Admitting: Gynecologic Oncology

## 2014-05-02 NOTE — Telephone Encounter (Signed)
Gyn-Onc: Follow-up Note:   Brittany Porter 68 y.o.Marland Kitchen female   CC:  Endometrial cancer surveillance, ovarian cancer surveillance.   HPI:  Endometrial and ovarian cancer surveillance.  This is a 68 y.o. . who on May 16, 2008, underwent operative staging of presumed endometrial adenocarcinoma. Intraoperative findings were also notable for an endometrioid cancer of the right ovary. The procedure that performed at  that time was a total abdominal hysterectomy, bilateral salpingo- oophorectomy, omentectomy, appendectomy, and umbilical hernia repair. 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 She subsequently received 6 cycles of Taxol and carboplatin therapy. Last cycle administered in November 2010. She underwent vaginal cuff brachytherapy for stage II endometrial cancer. She has completed in January 2011. Recurrence of her breast cancer was identified earlier this year and she is on letrozole therapy under the care of Dr. Marko Plume  Interval History: SBO last year was managed conservatively. She underwent Lsc hernia repair 05/2012 by Dr. Dalbert Batman. Pap test in 03/2013 was within normal limits   Past Medical History  Diagnosis Date  . Pleural effusion 04/2010  . Breast cancer   . Ovarian cancer   . Endometrial carcinoma 10/2008  . Iron deficiency   . Diabetes mellitus   . Hyperlipidemia   . Fluid overload   . Arthritis   . History of radiation therapy 04/13/2009, 04/16/2009, 04/27/2009, 05/07/2009, 05/18/2009    3000 cGy to proximal vagina   Past Surgical History  Procedure Laterality Date  . Abdominal hysterectomy  10/2008  . Melanoma removal  02/2009, 07/2009  . Knee arthroscopy  04/2010    Right knee  . Mastectomy  1997    Bilateral with lymph nodes  . Ventral hernia repair  05/28/2012    Procedure: LAPAROSCOPIC VENTRAL HERNIA;  Surgeon: Adin Hector, MD;  Location: [place];  Service: General;  Laterality: N/A;  . Insertion of  mesh  05/28/2012    Procedure: INSERTION OF MESH;  Surgeon: Adin Hector, MD;  Location: [place];  Service: General;  Laterality: N/A;  . Laparoscopic lysis of adhesions  05/28/2012    Procedure: LAPAROSCOPIC LYSIS OF ADHESIONS;  Surgeon: Adin Hector, MD;  Location: [place];  Service: General;  Laterality: N/A;    Family Hx:  Family History       Problem    Relation  Age of Onset   .  Stroke   Mother    .  Cancer   Father    .  Colon cancer   Sister    .  Stroke   Sister    .  Cancer   Sister      breast     .  Heart attack   Brother    .  Cancer   Brother      skin     .  Skin cancer   Brother    .  Cancer   Sister      colon       Review of Systems:  Constitutional  Feels well  Skin  Candidiasis of the inguinal area mons.  Cardiovascular  No chest pain, shortness of breath, or edema  Pulmonary  No cough or wheeze.  Gastro Intestinal  No nausea, vomitting, or diarrhoea. No bright red blood per rectum, no abdominal pain, change in bowel movement, or constipation.  Genito Urinary  No frequency, reports urgency and incontinence, dysuria,  Musculo Skeletal  No myalgia, reports worsening arthralgia and  joint pain. Neurologic  No weakness, numbness, change in gait,  Psychology  No depression, anxiety, insomnia.   Vitals: BP 155/76  Temp(Src) 99.7 F (37.6 C) (Oral)  Resp 16  Ht 5\' 3"  (1.6 m)  Wt 230 lb 11.2 oz (104.645 kg)  BMI 40.88 kg/m2  Physical Exam:  WD female in no acute distress  Neck  Supple without any enlargements.  Lymph node survey.  No cervical supraclavicular cervical or inguinal adenopathy  Cardiovascular  Pulse normal rate, regularity and rhythm.   Chest: Clear to auscultation bilateraly Psychiatry  Alert and oriented to person, place, and time  Back  No CVA tenderness  Abdomen  Lower bowel sounds, abdomen soft, non-tender and obese. Surgical sites intact without evidence of hernia. No evidence of a fluid wave or abdominal masses   Genito Urinary  Vulva/vagina: Normal external female genitalia. Skin changes from incontinence  Bladder/urethra: No lesions or masses  Vagina: Atrophic without any lesions.grade 1 rectocele, atrophic vagina with non odorous discharge  Rectal  Good tone, no masses no cul de sac nodularity.  Extremities  No bilateral cyanosis, clubbing or edema.    Assessment/Plan:  This is a 68 y.o. who underwent staging for a stage II endometrial cancer and stage I ovarian cancer in June of 2010. The patient completed 6 cycles of Taxol and carboplatin therapy in November of 2010. She completed vaginal cuff brachytherapy in January 2011. She's been without any evidence of disease since. Her admission for a partial small bowel obstruction 2013  prompted a hernia repair 05/2012. Her examination today was remarkable only for sever atrophic vaginitis. No evidence of disease since 05/2009  F/U in 6 months

## 2014-05-05 ENCOUNTER — Ambulatory Visit: Payer: Medicare Other | Attending: Gynecologic Oncology | Admitting: Gynecologic Oncology

## 2014-05-05 ENCOUNTER — Other Ambulatory Visit (HOSPITAL_COMMUNITY)
Admission: RE | Admit: 2014-05-05 | Discharge: 2014-05-05 | Disposition: A | Discharge status: Home / Self Care | Payer: Medicare Other | Source: Ambulatory Visit | Attending: Gynecologic Oncology | Admitting: Gynecologic Oncology

## 2014-05-05 ENCOUNTER — Encounter: Payer: Self-pay | Admitting: Gynecologic Oncology

## 2014-05-05 VITALS — BP 141/96 | HR 90 | Temp 98.5°F | Resp 16 | Ht 63.0 in | Wt 219.4 lb

## 2014-05-05 DIAGNOSIS — C569 Malignant neoplasm of unspecified ovary: Secondary | ICD-10-CM | POA: Insufficient documentation

## 2014-05-05 DIAGNOSIS — Z8544 Personal history of malignant neoplasm of other female genital organs: Secondary | ICD-10-CM | POA: Diagnosis not present

## 2014-05-05 DIAGNOSIS — Z01411 Encounter for gynecological examination (general) (routine) with abnormal findings: Secondary | ICD-10-CM | POA: Insufficient documentation

## 2014-05-05 DIAGNOSIS — C541 Malignant neoplasm of endometrium: Secondary | ICD-10-CM | POA: Insufficient documentation

## 2014-05-05 DIAGNOSIS — N952 Postmenopausal atrophic vaginitis: Secondary | ICD-10-CM | POA: Diagnosis not present

## 2014-05-05 DIAGNOSIS — Z8543 Personal history of malignant neoplasm of ovary: Secondary | ICD-10-CM

## 2014-05-05 NOTE — Progress Notes (Signed)
Gyn-Onc: Follow-up Note:   Brittany Porter 68 y.o.Marland Kitchen female   CC:  Endometrial cancer surveillance, ovarian cancer surveillance.    Assessment/Plan:  This is a 68 y.o. who underwent staging for a stage II endometrial cancer and stage I ovarian cancer in June of 2010. The patient completed 6 cycles of Taxol and carboplatin therapy in November of 2010. She completed vaginal cuff brachytherapy in January 2011. She's been without any evidence of disease since. Her admission for a partial small bowel obstruction 2013  prompted a hernia repair 05/2012. Her examination today was remarkable only for sever atrophic vaginitis. No evidence of disease since 05/2009  Pap collected F/U in 6 months     HPI:  Endometrial and ovarian cancer surveillance.  This is a 68 y.o. . who on May 16, 2008, underwent operative staging of presumed endometrial adenocarcinoma. Intraoperative findings were also notable for an endometrioid cancer of the right ovary. The procedure that performed at  that time was a total abdominal hysterectomy, bilateral salpingo- oophorectomy, omentectomy, appendectomy, and umbilical hernia repair. 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 She subsequently received 6 cycles of Taxol and carboplatin therapy. Last cycle administered in November 2010. She underwent vaginal cuff brachytherapy for stage II endometrial cancer. She has completed in January 2011. Recurrence of her breast cancer was identified earlier this year and she is on letrozole therapy under the care of Dr. Marko Plume   SBO 2013 was managed conservatively. She underwent Lsc hernia repair 05/2012 by Dr. Dalbert Batman. Pap test in 03/2013 was within normal limits   Doing well without complaints.  Past Medical History  Diagnosis Date  . Pleural effusion 04/2010  . Breast cancer   . Ovarian cancer   . Endometrial carcinoma 10/2008  . Iron deficiency   . Diabetes mellitus    . Hyperlipidemia   . Fluid overload   . Arthritis   . History of radiation therapy 04/13/2009, 04/16/2009, 04/27/2009, 05/07/2009, 05/18/2009    3000 cGy to proximal vagina   Past Surgical History  Procedure Laterality Date  . Abdominal hysterectomy  10/2008  . Melanoma removal  02/2009, 07/2009  . Knee arthroscopy  04/2010    Right knee  . Mastectomy  1997    Bilateral with lymph nodes  . Ventral hernia repair  05/28/2012    Procedure: LAPAROSCOPIC VENTRAL HERNIA;  Surgeon: Adin Hector, MD;  Location: [facility];  Service: General;  Laterality: N/A;  . Insertion of mesh  05/28/2012    Procedure: INSERTION OF MESH;  Surgeon: Adin Hector, MD;  Location: [facility];  Service: General;  Laterality: N/A;  . Laparoscopic lysis of adhesions  05/28/2012    Procedure: LAPAROSCOPIC LYSIS OF ADHESIONS;  Surgeon: Adin Hector, MD;  Location: [facility];  Service: General;  Laterality: N/A;    Family Hx:  Family History       Problem    Relation  Age of Onset   .  Stroke   Mother    .  Cancer   Father    .  Colon cancer   Sister    .  Stroke   Sister    .  Cancer   Sister      breast     .  Heart attack   Brother    .  Cancer   Brother      skin     .  Skin cancer   Brother    .  Cancer   Sister      colon       Review of Systems:  Constitutional  Feels well  Skin  Candidiasis of the inguinal area mons, less than prior visit.  L>R.  Cardiovascular  No chest pain, shortness of breath, or edema  Pulmonary  No cough or wheeze.  Gastro Intestinal  No nausea, vomitting, or diarrhoea. No bright red blood per rectum, no abdominal pain, change in bowel movement, or constipation.  Genito Urinary  No frequency, reports urgency and incontinence, dysuria,  Musculo Skeletal  No myalgia, reports worsening arthralgia and joint pain. Neurologic  No weakness, numbness, change in gait,  Psychology  No depression, anxiety, insomnia.   Vitals: BP 141/96 mmHg  Pulse 90  Temp(Src) 98.5 F (36.9  C) (Oral)  Resp 16  Ht 5\' 3"  (1.6 m)  Wt 219 lb 6.4 oz (99.519 kg)  BMI 38.87 kg/m2  Physical Exam:  WD female in no acute distress  Neck  Supple without any enlargements.  Lymph node survey.  No cervical supraclavicular cervical or inguinal adenopathy  Cardiovascular  Pulse normal rate, regularity and rhythm.   Chest: Clear to auscultation bilateraly Psychiatry  Alert and oriented to person, place, and time  Back  No CVA tenderness  Abdomen  Lower bowel sounds, abdomen soft, non-tender and obese. Surgical sites intact without evidence of hernia. No evidence of a fluid wave or abdominal masses  Genito Urinary  Vulva/vagina: Normal external female genitalia. Skin changes from incontinence Left inguinal fold with trace candidiasis. Bladder/urethra: No lesions or masses  Vagina: Atrophic without any lesions.grade 1 rectocele, atrophic vagina  No discharge or bleeding.  No cul de sac masses Rectal  Good tone, no masses no cul de sac nodularity.  Extremities  No bilateral cyanosis, clubbing, Bilateral 2+ edema of the lower extremities.

## 2014-05-05 NOTE — Patient Instructions (Signed)
  HAPPY HOLIDAYS!  Follow-up in 6 months    Thank you very much [name] for allowing me to provide care for you today.  I appreciate your confidence in choosing our Gynecologic Oncology team.  If you have any questions about your visit today please call our office and we will get back to you as soon as possible.  Please consider using the website Medlineplus.gov as an Geneticist, molecular.   Francetta Found. Nanna Ertle MD., PhD Gynecologic Oncology

## 2014-05-05 NOTE — Addendum Note (Signed)
Addended by: Christa See on: 05/05/2014 04:12 PM   Modules accepted: Orders

## 2014-05-08 LAB — CYTOLOGY - PAP

## 2014-05-13 ENCOUNTER — Telehealth: Payer: Self-pay | Admitting: *Deleted

## 2014-05-13 NOTE — Telephone Encounter (Signed)
-----   Message from Dorothyann Gibbs, NP sent at 05/13/2014  1:08 PM EST ----- Please let her know pap is normal.  Thanks ----- Message -----    From: Lab in [facility] Interface    Sent: 05/08/2014  12:46 PM      To: [name], MD

## 2014-05-13 NOTE — Telephone Encounter (Signed)
Called and left VM for patient on home phone to give [place] a call back.

## 2014-05-13 NOTE — Telephone Encounter (Signed)
Patient called back - let her know pap smear was normal.

## 2014-05-20 DIAGNOSIS — R399 Unspecified symptoms and signs involving the genitourinary system: Secondary | ICD-10-CM | POA: Diagnosis not present

## 2014-06-24 DIAGNOSIS — Z08 Encounter for follow-up examination after completed treatment for malignant neoplasm: Secondary | ICD-10-CM | POA: Diagnosis not present

## 2014-06-24 DIAGNOSIS — D225 Melanocytic nevi of trunk: Secondary | ICD-10-CM | POA: Diagnosis not present

## 2014-06-24 DIAGNOSIS — L821 Other seborrheic keratosis: Secondary | ICD-10-CM | POA: Diagnosis not present

## 2014-06-24 DIAGNOSIS — Z85820 Personal history of malignant melanoma of skin: Secondary | ICD-10-CM | POA: Diagnosis not present

## 2014-07-01 ENCOUNTER — Other Ambulatory Visit: Payer: Self-pay | Admitting: Oncology

## 2014-07-01 ENCOUNTER — Telehealth: Payer: Self-pay | Admitting: *Deleted

## 2014-07-01 NOTE — Telephone Encounter (Signed)
Called and left VM for patient to let her know refill for letrozole has been sent to her pharmacy and reminded her of her next followup appt with Dr. Marko Plume in May 2016.

## 2014-07-03 ENCOUNTER — Telehealth: Payer: Self-pay | Admitting: Oncology

## 2014-07-07 DIAGNOSIS — Z0000 Encounter for general adult medical examination without abnormal findings: Secondary | ICD-10-CM | POA: Diagnosis not present

## 2014-07-07 DIAGNOSIS — N3941 Urge incontinence: Secondary | ICD-10-CM | POA: Diagnosis not present

## 2014-07-07 DIAGNOSIS — E780 Pure hypercholesterolemia: Secondary | ICD-10-CM | POA: Diagnosis not present

## 2014-07-07 DIAGNOSIS — E1140 Type 2 diabetes mellitus with diabetic neuropathy, unspecified: Secondary | ICD-10-CM | POA: Diagnosis not present

## 2014-07-07 DIAGNOSIS — Z1389 Encounter for screening for other disorder: Secondary | ICD-10-CM | POA: Diagnosis not present

## 2014-07-07 DIAGNOSIS — M1990 Unspecified osteoarthritis, unspecified site: Secondary | ICD-10-CM | POA: Diagnosis not present

## 2014-08-27 DIAGNOSIS — M19072 Primary osteoarthritis, left ankle and foot: Secondary | ICD-10-CM | POA: Diagnosis not present

## 2014-08-27 DIAGNOSIS — M1711 Unilateral primary osteoarthritis, right knee: Secondary | ICD-10-CM | POA: Diagnosis not present

## 2014-09-11 DIAGNOSIS — M25775 Osteophyte, left foot: Secondary | ICD-10-CM | POA: Diagnosis not present

## 2014-09-11 DIAGNOSIS — M85472 Solitary bone cyst, left ankle and foot: Secondary | ICD-10-CM | POA: Diagnosis not present

## 2014-09-17 ENCOUNTER — Other Ambulatory Visit: Payer: Self-pay | Admitting: Oncology

## 2014-09-18 ENCOUNTER — Ambulatory Visit (HOSPITAL_BASED_OUTPATIENT_CLINIC_OR_DEPARTMENT_OTHER): Payer: Medicare Other | Admitting: Oncology

## 2014-09-18 ENCOUNTER — Telehealth: Payer: Self-pay | Admitting: Oncology

## 2014-09-18 ENCOUNTER — Encounter: Payer: Self-pay | Admitting: Oncology

## 2014-09-18 ENCOUNTER — Other Ambulatory Visit (HOSPITAL_BASED_OUTPATIENT_CLINIC_OR_DEPARTMENT_OTHER): Payer: Medicare Other

## 2014-09-18 VITALS — BP 155/78 | HR 80 | Temp 98.4°F | Resp 18 | Ht 63.0 in | Wt 215.1 lb

## 2014-09-18 DIAGNOSIS — C569 Malignant neoplasm of unspecified ovary: Secondary | ICD-10-CM | POA: Diagnosis not present

## 2014-09-18 DIAGNOSIS — C50919 Malignant neoplasm of unspecified site of unspecified female breast: Secondary | ICD-10-CM | POA: Diagnosis not present

## 2014-09-18 DIAGNOSIS — J910 Malignant pleural effusion: Secondary | ICD-10-CM

## 2014-09-18 DIAGNOSIS — C50911 Malignant neoplasm of unspecified site of right female breast: Secondary | ICD-10-CM | POA: Diagnosis not present

## 2014-09-18 DIAGNOSIS — Z79811 Long term (current) use of aromatase inhibitors: Secondary | ICD-10-CM | POA: Diagnosis not present

## 2014-09-18 DIAGNOSIS — C50912 Malignant neoplasm of unspecified site of left female breast: Secondary | ICD-10-CM

## 2014-09-18 DIAGNOSIS — C55 Malignant neoplasm of uterus, part unspecified: Secondary | ICD-10-CM

## 2014-09-18 DIAGNOSIS — E2839 Other primary ovarian failure: Secondary | ICD-10-CM

## 2014-09-18 DIAGNOSIS — Z85820 Personal history of malignant melanoma of skin: Secondary | ICD-10-CM | POA: Diagnosis not present

## 2014-09-18 DIAGNOSIS — Z8542 Personal history of malignant neoplasm of other parts of uterus: Secondary | ICD-10-CM | POA: Diagnosis not present

## 2014-09-18 DIAGNOSIS — C4359 Malignant melanoma of other part of trunk: Secondary | ICD-10-CM

## 2014-09-18 DIAGNOSIS — E669 Obesity, unspecified: Secondary | ICD-10-CM

## 2014-09-18 LAB — COMPREHENSIVE METABOLIC PANEL (CC13)
ALT: 13 U/L (ref 0–55)
AST: 20 U/L (ref 5–34)
Albumin: 3.4 g/dL — ABNORMAL LOW (ref 3.5–5.0)
Alkaline Phosphatase: 54 U/L (ref 40–150)
Anion Gap: 14 mEq/L — ABNORMAL HIGH (ref 3–11)
BUN: 16.1 mg/dL (ref 7.0–26.0)
CO2: 27 mEq/L (ref 22–29)
Calcium: 9 mg/dL (ref 8.4–10.4)
Chloride: 103 mEq/L (ref 98–109)
Creatinine: 0.6 mg/dL (ref 0.6–1.1)
EGFR: >90 mL/min/{1.73_m2} (ref >90)
Glucose: 110 mg/dl (ref 70–140)
Potassium: 4.3 mEq/L (ref 3.5–5.1)
Sodium: 144 mEq/L (ref 136–145)
Total Bilirubin: 0.68 mg/dL (ref 0.20–1.20)
Total Protein: 6.8 g/dL (ref 6.4–8.3)

## 2014-09-18 LAB — CBC WITH DIFFERENTIAL/PLATELET
BASO%: 0.7 % (ref 0.0–2.0)
Basophils Absolute: 0 10*3/uL (ref 0.0–0.1)
EOS%: 2.3 % (ref 0.0–7.0)
Eosinophils Absolute: 0.1 10*3/uL (ref 0.0–0.5)
HCT: 40.8 % (ref 34.8–46.6)
HGB: 13.3 g/dL (ref 11.6–15.9)
LYMPH%: 29.9 % (ref 14.0–49.7)
MCH: 28.4 pg (ref 25.1–34.0)
MCHC: 32.7 g/dL (ref 31.5–36.0)
MCV: 86.9 fL (ref 79.5–101.0)
MONO#: 0.5 10*3/uL (ref 0.1–0.9)
MONO%: 8.1 % (ref 0.0–14.0)
NEUT#: 3.6 10*3/uL (ref 1.5–6.5)
NEUT%: 59 % (ref 38.4–76.8)
Platelets: 196 10*3/uL (ref 145–400)
RBC: 4.69 10*6/uL (ref 3.70–5.45)
RDW: 13.9 % (ref 11.2–14.5)
WBC: 6.1 10*3/uL (ref 3.9–10.3)
lymph#: 1.8 10*3/uL (ref 0.9–3.3)

## 2014-09-18 NOTE — Progress Notes (Signed)
OFFICE PROGRESS NOTE   Sep 18, 2014   [place] [facility] [place], [place], [place], [place]), [facility], [place]  INTERVAL HISTORY:  Patient is seen, alone for visit, in 6 month follow up of metastatic breast cancer since malignant pleural effusion in Dec 2011, continuing letrozole which was begun then. She had bilateral brease cancer in 1997, stage II endometrioid endometrial carcinoma and IC endometrioid ovarian carcinomas in 2010, not known active, and history of melanoma on back 02-2012, not known active. She saw Dr Skeet Latch in 04-2014, no evidence of active gyn cancer and will see her again 10-23-2014. Bone density was normal in 02-2013. Bone density scan in 02-2015 would be appropriate. Last chest imaging in this EMR was CT 02-2013.  Patient had surgery for spur on dorsum of left foot by Dr Novella Olive as outpatient last week, in boot and to see him this week. She has had some increase in SOB "when I turn at night", improved with prn lasix which she uses ~ 3x weekly.  More discomfort right knee since the left foot surgery, otherwise arthritis discomfort unchanged; patient tells me that she "had no arthritis until letrozole" tho this is not typical distribution pattern for aromatase inhibitor arthralgias.  No PAC She had flu vaccine Patient confirms that she had genetics testing 2010,  negative BRCA. CA 2729 was 71 in 04-2010   ONCOLOGIC HISTORY Bilateral breast cancer treated with bilateral mastectomies and bilateral axillary node dissections by Dr Andrey Campanile in 1997; she recalls that all nodes were negative. She had chemotherapy by Dr Tressie Stalker and bilateral radiation by Dr Danny Lawless. 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 She was begun on letrozole at that time.Note CA 2729 was elevated at time of recurrent  disease.  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 She received 6 cycles adjuvant taxol carboplatin thru Nov 2010 and vaginal cuff brachytherapy completed Jan 2011. Note CA125 was elevated at presentation.  Melanoma upper mid back treated with wide local excision 02-2012 ( that path not located in EMR )       Review of systems as above, also: a couple of UTIs treated by Dr Tamala Julian. No GI symptoms. No bleeding. No abdominal or pelvic distension or pain. Intentional decrease in portion sizes with gradual weight loss, tho appetite is good, including eggs, tenderloin, gravy and toast this AM. Remainder of 10 point Review of Systems negative.  Objective:  Vital signs in last 24 hours:  BP 155/78 mmHg  Pulse 80  Temp(Src) 98.4 F (36.9 C) (Oral)  Resp 18  Ht '5\' 3"'  (1.6 m)  Wt 215 lb 1.6 oz (97.569 kg)  BMI 38.11 kg/m2  Weight down 12 lbs from 03-2014 Alert, oriented and appropriate. Ambulatory without assistance, somewhat restricted with left foot.    HEENT:PERRL, sclerae not icteric. Oral mucosa moist without lesions, posterior pharynx clear.  Neck supple. No JVD.  Lymphatics:no cervical,supraclavicular, axillary or inguinal adenopathy Resp: clear to auscultation bilaterally and normal percussion bilaterally Cardio: regular rate and rhythm. No gallop. GI: abdomen obese, soft, nontender, not distended, no mass or organomegaly. Normally active bowel sounds. Surgical incision not remarkable. Musculoskeletal/ Extremities: without pitting edema, cords, tenderness Neuro: nonfocal  PSYCH appropriate mood and affect Skin without rash, ecchymosis, petechiae Bilateral mastectomy scars well healed, without evidence of local recurrence.  Lab Results:  Results for orders placed or performed in visit on 09/18/14  CBC with Differential  Result Value Ref Range   WBC 6.1 3.9 - 10.3 10e3/uL   NEUT# 3.6 1.5 - 6.5 10e3/uL    HGB 13.3 11.6 - 15.9 g/dL   HCT 40.8 34.8 - 46.6 %   Platelets 196 145 - 400 10e3/uL   MCV 86.9 79.5 - 101.0 fL   MCH 28.4 25.1 - 34.0 pg   MCHC 32.7 31.5 - 36.0 g/dL   RBC 4.69 3.70 - 5.45 10e6/uL   RDW 13.9 11.2 - 14.5 %   lymph# 1.8 0.9 - 3.3 10e3/uL   MONO# 0.5 0.1 - 0.9 10e3/uL   Eosinophils Absolute 0.1 0.0 - 0.5 10e3/uL   Basophils Absolute 0.0 0.0 - 0.1 10e3/uL   NEUT% 59.0 38.4 - 76.8 %   LYMPH% 29.9 14.0 - 49.7 %   MONO% 8.1 0.0 - 14.0 %   EOS% 2.3 0.0 - 7.0 %   BASO% 0.7 0.0 - 2.0 %  Comprehensive metabolic panel (Cmet) - [facility]  Result Value Ref Range   Sodium 144 136 - 145 mEq/L   Potassium 4.3 3.5 - 5.1 mEq/L   Chloride 103 98 - 109 mEq/L   CO2 27 22 - 29 mEq/L   Glucose 110 70 - 140 mg/dl   BUN 16.1 7.0 - 26.0 mg/dL   Creatinine 0.6 0.6 - 1.1 mg/dL   Total Bilirubin 0.68 0.20 - 1.20 mg/dL   Alkaline Phosphatase 54 40 - 150 U/L   AST 20 5 - 34 U/L   ALT 13 0 - 55 U/L   Total Protein 6.8 6.4 - 8.3 g/dL   Albumin 3.4 (L) 3.5 - 5.0 g/dL   Calcium 9.0 8.4 - 10.4 mg/dL   Anion Gap 14 (H) 3 - 11 mEq/L   EGFR >90 >90 ml/min/1.73 m2    CA 125 available after visit 12, this having been 8 in 03-2014  CA 2729 available after visit 42, this having been 32 in 03-2014, 33 in 09-2013, and 71 in 04-2010  Studies/Results:  No results found.  Medications: I have reviewed the patient's current medications.  DISCUSSION: prior to CA 2729 resulting slightly higher, plan was to follow again in 6 months. With slight increase in marker will suggest repeating labs when she is at this office to see Dr Skeet Latch in one month, and will get CXR. If repeat is improved and CXR negative I will see her as planned in 6 months, otherwise sooner.  Assessment/Plan:  1.Bilateral breast cancers 1997 treated with bilateral mastectomies and axillary node dissections, adjuvant chemotherapy and radiation. 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  On Letrozole since ~ 04-2010. Slight increase in CA 2729 today and some increased SOB better with prn lasix, plan as above. 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 She has 6 mo follow up with Dr Skeet Latch in a month. CA 125 stable in normal range 3. Melanoma upper back 2013, treated with wide excision. Followed by dermatology. 4.normal bone density 02-2013. Suggest repeating 02-2015, but have also encouraged her to continue good calcium with D and weight bearing exercise as she is being treated with aromatase inhibitor. 5. CT chest report 02-2013 of sclerotic lesion in left humeral head, then 1.1 cm compared with 0.9 cm 2011. Plain xrays not remarkable 09-2013, no worsening of symptoms  6.obesity, BMI 38. Encouraged her to continue dietary interventions, reminded her that weight loss will help arthritis symptoms in hips and knees   Time spent 25 min including >50% counseling and coordination of care. Will be in touch with patient re labs and follow up as above. Cc this note Drs Dalbert Batman and Nat Christen, MD   09/18/2014, 1:29 PM

## 2014-09-18 NOTE — Telephone Encounter (Signed)
appoitments made and printed avs for patient

## 2014-09-19 DIAGNOSIS — Z9889 Other specified postprocedural states: Secondary | ICD-10-CM | POA: Diagnosis not present

## 2014-09-19 LAB — CA 125: CA 125: 12 U/mL (ref <35)

## 2014-09-19 LAB — CANCER ANTIGEN 27.29: CA 27.29: 42 U/mL — ABNORMAL HIGH (ref 0–39)

## 2014-09-20 ENCOUNTER — Other Ambulatory Visit: Payer: Self-pay | Admitting: Oncology

## 2014-09-20 DIAGNOSIS — C50912 Malignant neoplasm of unspecified site of left female breast: Principal | ICD-10-CM

## 2014-09-20 DIAGNOSIS — J910 Malignant pleural effusion: Secondary | ICD-10-CM

## 2014-09-20 DIAGNOSIS — C50911 Malignant neoplasm of unspecified site of right female breast: Secondary | ICD-10-CM

## 2014-09-22 ENCOUNTER — Telehealth: Payer: Self-pay

## 2014-09-22 NOTE — Telephone Encounter (Signed)
Left a message in Home vm to call the cancer tomorrow to review the lab results as noted below. Did set up lab appointment on 11-02-14 at 1000 prior to D Brewster's appointment at 1030 for repeat labsas requested by Dr. Marko Plume.

## 2014-09-22 NOTE — Telephone Encounter (Signed)
-----   Message from Gordy Levan, MD sent at 09/20/2014  3:19 PM EDT ----- Please let her know that ca 2729 was up a little more than last labs, at 48, having been 32 in 03-2014. I would like to repeat this lab in a month, which we can do when she comes to see Dr Skeet Latch on 10-23-14. I would like CXR also in next few weeks. I need to be sure to see results and we will need to let her know. If marker is lower and CXR ok, can leave my next visit in 6 months as planned, otherwise I will need to see her sooner.  POF done, orders in

## 2014-09-23 ENCOUNTER — Telehealth: Payer: Self-pay | Admitting: *Deleted

## 2014-09-23 NOTE — Telephone Encounter (Signed)
TC from patient re: lab work from last week. Reviewed labs with her and Dr. Mariana Kaufman note about repeating labs in 1 month, along with CXR. Pt verbalized understanding.

## 2014-09-30 DIAGNOSIS — E780 Pure hypercholesterolemia: Secondary | ICD-10-CM | POA: Diagnosis not present

## 2014-09-30 DIAGNOSIS — E1140 Type 2 diabetes mellitus with diabetic neuropathy, unspecified: Secondary | ICD-10-CM | POA: Diagnosis not present

## 2014-10-02 ENCOUNTER — Telehealth: Payer: Self-pay | Admitting: *Deleted

## 2014-10-02 ENCOUNTER — Other Ambulatory Visit: Payer: Self-pay | Admitting: *Deleted

## 2014-10-02 NOTE — Telephone Encounter (Signed)
LEFT MESSAGE ON PT.'S MOBILE NUMBER.

## 2014-10-02 NOTE — Telephone Encounter (Signed)
Patient called asking "when is my bone density and chest xray scheduled?"  Informed her 03-17-2015 at 1000 GI [phone]) she will have scan done.  The chest xray is ordered and she can walk in hospital radiology at her convenience 8:00 am until 5:00 pm for a cxr.  She will call to verify which GI site test is scheduled for.

## 2014-10-03 ENCOUNTER — Ambulatory Visit (HOSPITAL_COMMUNITY)
Admission: RE | Admit: 2014-10-03 | Discharge: 2014-10-03 | Disposition: A | Discharge status: Home / Self Care | Payer: Medicare Other | Source: Ambulatory Visit | Attending: Oncology | Admitting: Oncology

## 2014-10-03 DIAGNOSIS — C50912 Malignant neoplasm of unspecified site of left female breast: Secondary | ICD-10-CM

## 2014-10-03 DIAGNOSIS — J984 Other disorders of lung: Secondary | ICD-10-CM | POA: Insufficient documentation

## 2014-10-03 DIAGNOSIS — J90 Pleural effusion, not elsewhere classified: Secondary | ICD-10-CM | POA: Diagnosis not present

## 2014-10-03 DIAGNOSIS — C569 Malignant neoplasm of unspecified ovary: Secondary | ICD-10-CM | POA: Diagnosis not present

## 2014-10-03 DIAGNOSIS — Z9013 Acquired absence of bilateral breasts and nipples: Secondary | ICD-10-CM | POA: Insufficient documentation

## 2014-10-03 DIAGNOSIS — C50911 Malignant neoplasm of unspecified site of right female breast: Secondary | ICD-10-CM

## 2014-10-03 DIAGNOSIS — Z853 Personal history of malignant neoplasm of breast: Secondary | ICD-10-CM | POA: Insufficient documentation

## 2014-10-03 DIAGNOSIS — C50919 Malignant neoplasm of unspecified site of unspecified female breast: Secondary | ICD-10-CM | POA: Diagnosis not present

## 2014-10-03 DIAGNOSIS — J910 Malignant pleural effusion: Secondary | ICD-10-CM

## 2014-10-06 ENCOUNTER — Telehealth: Payer: Self-pay | Admitting: *Deleted

## 2014-10-06 NOTE — Telephone Encounter (Signed)
-----   Message from Gordy Levan, MD sent at 10/05/2014  9:42 AM EDT ----- Labs seen and need follow up: please let patient know that the CXR looked stable, with no recurrent pleural effusion and nothing else that looks concerning

## 2014-10-06 NOTE — Telephone Encounter (Signed)
Called patient and notified her of the results as noted below by Dr. Marko Plume. No other issues or concerns noted at this time.

## 2014-10-10 DIAGNOSIS — Z9889 Other specified postprocedural states: Secondary | ICD-10-CM | POA: Diagnosis not present

## 2014-10-23 ENCOUNTER — Ambulatory Visit: Payer: Medicare Other | Admitting: Gynecologic Oncology

## 2014-10-23 ENCOUNTER — Other Ambulatory Visit: Payer: Medicare Other

## 2014-10-29 DIAGNOSIS — J069 Acute upper respiratory infection, unspecified: Secondary | ICD-10-CM | POA: Diagnosis not present

## 2014-11-07 DIAGNOSIS — Z9889 Other specified postprocedural states: Secondary | ICD-10-CM | POA: Diagnosis not present

## 2014-11-13 ENCOUNTER — Other Ambulatory Visit: Payer: Self-pay | Admitting: Oncology

## 2014-11-13 ENCOUNTER — Other Ambulatory Visit (HOSPITAL_BASED_OUTPATIENT_CLINIC_OR_DEPARTMENT_OTHER): Payer: Medicare Other

## 2014-11-13 ENCOUNTER — Ambulatory Visit: Payer: Medicare Other | Attending: Gynecologic Oncology | Admitting: Gynecologic Oncology

## 2014-11-13 ENCOUNTER — Encounter: Payer: Self-pay | Admitting: Gynecologic Oncology

## 2014-11-13 VITALS — BP 111/64 | HR 88 | Temp 98.7°F | Resp 18 | Ht 63.0 in | Wt 216.8 lb

## 2014-11-13 DIAGNOSIS — J910 Malignant pleural effusion: Secondary | ICD-10-CM

## 2014-11-13 DIAGNOSIS — C569 Malignant neoplasm of unspecified ovary: Secondary | ICD-10-CM | POA: Diagnosis not present

## 2014-11-13 DIAGNOSIS — Z8543 Personal history of malignant neoplasm of ovary: Secondary | ICD-10-CM

## 2014-11-13 DIAGNOSIS — C50912 Malignant neoplasm of unspecified site of left female breast: Principal | ICD-10-CM

## 2014-11-13 DIAGNOSIS — C50919 Malignant neoplasm of unspecified site of unspecified female breast: Secondary | ICD-10-CM | POA: Diagnosis not present

## 2014-11-13 DIAGNOSIS — C50911 Malignant neoplasm of unspecified site of right female breast: Secondary | ICD-10-CM | POA: Diagnosis not present

## 2014-11-13 NOTE — Progress Notes (Signed)
GYN ONCOLOGY OFFICE VISIT   Brittany Porter 69 y.o.Marland Kitchen female   CC:  Endometrial cancer surveillance, ovarian cancer surveillance.    Assessment/Plan:  This is a 69 y.o. who underwent staging for a stage II endometrial cancer and stage I ovarian cancer in June of 2010. The patient completed 6 cycles of Taxol and carboplatin therapy in November of 2010. She completed vaginal cuff brachytherapy in January 2011. She's been without any evidence of disease since. Her admission for a partial small bowel obstruction 2013  prompted a hernia repair on 05/2012.  No evidence of disease since 05/2009   F/U in 12 months with Joylene John NP    HPI:  Endometrial and ovarian cancer surveillance.  This is a 69 y.o. . who on May 16, 2008, underwent operative staging of presumed endometrial adenocarcinoma. Intraoperative findings were also notable for an endometrioid cancer of the right ovary. The procedure that performed at  that time was a total abdominal hysterectomy, bilateral salpingo- oophorectomy, omentectomy, appendectomy, and umbilical hernia repair. 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 She subsequently received 6 cycles of Taxol and carboplatin therapy. Last cycle administered in November 2010. She underwent vaginal cuff brachytherapy for stage II endometrial cancer. She has completed in January 2011. Recurrence of her breast cancer was identified earlier this year and she is on letrozole therapy under the care of Dr. Marko Plume   SBO 2013 was managed conservatively. She underwent Lsc hernia repair 05/2012 by Dr. Dalbert Batman. Pap test in 04/2014 was within normal limits   Doing well without complaints.  Past Medical History  Diagnosis Date  . Pleural effusion 04/2010  . Breast cancer   . Ovarian cancer   . Endometrial carcinoma 10/2008  . Iron deficiency   . Diabetes mellitus   . Hyperlipidemia   . Fluid overload   . Arthritis    . History of radiation therapy 04/13/2009, 04/16/2009, 04/27/2009, 05/07/2009, 05/18/2009    3000 cGy to proximal vagina   Past Surgical History  Procedure Laterality Date  . Abdominal hysterectomy  10/2008  . Melanoma removal  02/2009, 07/2009  . Knee arthroscopy  04/2010    Right knee  . Mastectomy  1997    Bilateral with lymph nodes  . Ventral hernia repair  05/28/2012    Procedure: LAPAROSCOPIC VENTRAL HERNIA;  Surgeon: Adin Hector, MD;  Location: [facility];  Service: General;  Laterality: N/A;  . Insertion of mesh  05/28/2012    Procedure: INSERTION OF MESH;  Surgeon: Adin Hector, MD;  Location: [facility];  Service: General;  Laterality: N/A;  . Laparoscopic lysis of adhesions  05/28/2012    Procedure: LAPAROSCOPIC LYSIS OF ADHESIONS;  Surgeon: Adin Hector, MD;  Location: [facility];  Service: General;  Laterality: N/A;    Family Hx:  Family History       Problem    Relation  Age of Onset   .  Stroke   Mother    .  Cancer   Father    .  Colon cancer   Sister    .  Stroke   Sister    .  Cancer   Sister      breast     .  Heart attack   Brother    .  Cancer   Brother      skin     .  Skin cancer   Brother    .  Cancer   Sister      colon       Review of Systems:  Constitutional  Feels well  Pulmonary  No cough or wheeze.  Gastro Intestinal  No nausea, vomitting, or diarrhoea. No bright red blood per rectum, no abdominal pain, change in bowel movement, or constipation.  Genito Urinary  No frequency, reports urgency and incontinence, dysuria,  Musculo Skeletal  No myalgia, reports worsening arthralgia and joint pain. Neurologic  No weakness, numbness, change in gait,  Psychology  No depression, anxiety, insomnia.   Vitals: BP 111/64 mmHg  Pulse 88  Temp(Src) 98.7 F (37.1 C) (Oral)  Resp 18  Ht '5\' 3"'$  (1.6 m)  Wt 216 lb 12.8 oz (98.34 kg)  BMI 38.41 kg/m2  SpO2 100%  Physical Exam:  WD female in no acute distress  Neck  Supple without any enlargements.   Lymph node survey.  No cervical supraclavicular cervical or inguinal adenopathy  Cardiovascular  Pulse normal rate, regularity and rhythm.   Chest: Clear to auscultation bilateraly Psychiatry  Alert and oriented to person, place, and time  Back  No CVA tenderness  Abdomen  Lower bowel sounds, abdomen soft, non-tender and obese. Surgical sites intact without evidence of hernia. No evidence of a fluid wave or abdominal masses  Genito Urinary  Vulva/vagina: Normal external female genitalia.   Bladder/urethra: No lesions or masses  Vagina: Atrophic without any lesions.grade 1 rectocele, atrophic fore shortened vagina  No discharge or bleeding.  No cul de sac masses Rectal  Good tone, no masses no cul de sac nodularity.  Extremities  No bilateral cyanosis, clubbing, Bilateral 2+ edema of the lower extremities.

## 2014-11-13 NOTE — Patient Instructions (Signed)
Plan to follow up in one year with Joylene John, NP.  Please call in Jan or Feb 2017 to schedule your appointment.    Thank you for coming to see me today.  I appreciate your confidence in choosing [facility] for your medical care.  If you have any questions about your visit today, please call our office and we will get back to you as soon as possible.  Dr. [name] and Joylene John, NP Gynecologic Oncology

## 2014-11-14 LAB — CANCER ANTIGEN 27.29: CA 27.29: 52 U/mL — ABNORMAL HIGH (ref 0–39)

## 2014-11-18 ENCOUNTER — Telehealth: Payer: Self-pay | Admitting: Oncology

## 2014-11-18 ENCOUNTER — Other Ambulatory Visit: Payer: Self-pay | Admitting: Oncology

## 2014-11-18 DIAGNOSIS — C50911 Malignant neoplasm of unspecified site of right female breast: Secondary | ICD-10-CM

## 2014-11-18 DIAGNOSIS — C4359 Malignant melanoma of other part of trunk: Secondary | ICD-10-CM

## 2014-11-18 DIAGNOSIS — C569 Malignant neoplasm of unspecified ovary: Secondary | ICD-10-CM

## 2014-11-18 DIAGNOSIS — C50912 Malignant neoplasm of unspecified site of left female breast: Principal | ICD-10-CM

## 2014-11-18 NOTE — Telephone Encounter (Signed)
Called and left a message with upcoming appointments

## 2014-11-18 NOTE — Telephone Encounter (Signed)
Medical Oncology  Repeat CA 2729 up some further at 52 on 11-13-14, this having been 42 on 5-5 and 32 in Nov 2015. I spoke by phone with patient now, who denies changes in symptoms since she was here last, no complaints of increased SOB, does have some joint aches which may be letrozole or other. Told patient that I would like CT/PET to restage, then will see her back. Preauthorization for PET may take 2 weeks or so. She will stop letrozole now, which will also allow [place] to see how much of the aching is related to that medication. She knows to call back if questions or concerns prior to next visit.  POF to schedulers now.  Godfrey Pick, MD

## 2014-12-02 ENCOUNTER — Telehealth: Payer: Self-pay | Admitting: Oncology

## 2014-12-02 ENCOUNTER — Other Ambulatory Visit: Payer: Self-pay | Admitting: *Deleted

## 2014-12-02 DIAGNOSIS — C569 Malignant neoplasm of unspecified ovary: Secondary | ICD-10-CM

## 2014-12-02 NOTE — Telephone Encounter (Signed)
S/w pt confirming labs added before PET scan and CT per 07/19 POF.... Cherylann Banas

## 2014-12-03 ENCOUNTER — Ambulatory Visit (HOSPITAL_COMMUNITY)
Admission: RE | Admit: 2014-12-03 | Discharge: 2014-12-03 | Disposition: A | Discharge status: Home / Self Care | Payer: Medicare Other | Source: Ambulatory Visit | Attending: Oncology | Admitting: Oncology

## 2014-12-03 ENCOUNTER — Encounter (HOSPITAL_COMMUNITY)
Admission: RE | Admit: 2014-12-03 | Discharge: 2014-12-03 | Disposition: A | Discharge status: Home / Self Care | Payer: Medicare Other | Source: Ambulatory Visit | Attending: Oncology | Admitting: Oncology

## 2014-12-03 ENCOUNTER — Other Ambulatory Visit (HOSPITAL_BASED_OUTPATIENT_CLINIC_OR_DEPARTMENT_OTHER): Payer: Medicare Other

## 2014-12-03 DIAGNOSIS — C50912 Malignant neoplasm of unspecified site of left female breast: Principal | ICD-10-CM

## 2014-12-03 DIAGNOSIS — Z8543 Personal history of malignant neoplasm of ovary: Secondary | ICD-10-CM | POA: Insufficient documentation

## 2014-12-03 DIAGNOSIS — Z853 Personal history of malignant neoplasm of breast: Secondary | ICD-10-CM | POA: Insufficient documentation

## 2014-12-03 DIAGNOSIS — K4090 Unilateral inguinal hernia, without obstruction or gangrene, not specified as recurrent: Secondary | ICD-10-CM | POA: Diagnosis not present

## 2014-12-03 DIAGNOSIS — J90 Pleural effusion, not elsewhere classified: Secondary | ICD-10-CM | POA: Diagnosis not present

## 2014-12-03 DIAGNOSIS — Z9013 Acquired absence of bilateral breasts and nipples: Secondary | ICD-10-CM | POA: Insufficient documentation

## 2014-12-03 DIAGNOSIS — C50911 Malignant neoplasm of unspecified site of right female breast: Secondary | ICD-10-CM | POA: Diagnosis not present

## 2014-12-03 DIAGNOSIS — C569 Malignant neoplasm of unspecified ovary: Secondary | ICD-10-CM

## 2014-12-03 DIAGNOSIS — K5730 Diverticulosis of large intestine without perforation or abscess without bleeding: Secondary | ICD-10-CM | POA: Diagnosis not present

## 2014-12-03 LAB — CBC WITH DIFFERENTIAL/PLATELET
BASO%: 0.4 % (ref 0.0–2.0)
Basophils Absolute: 0 10*3/uL (ref 0.0–0.1)
EOS%: 2.2 % (ref 0.0–7.0)
Eosinophils Absolute: 0.2 10*3/uL (ref 0.0–0.5)
HCT: 44.5 % (ref 34.8–46.6)
HGB: 14.5 g/dL (ref 11.6–15.9)
LYMPH%: 29.9 % (ref 14.0–49.7)
MCH: 29.3 pg (ref 25.1–34.0)
MCHC: 32.6 g/dL (ref 31.5–36.0)
MCV: 89.9 fL (ref 79.5–101.0)
MONO#: 0.5 10*3/uL (ref 0.1–0.9)
MONO%: 7 % (ref 0.0–14.0)
NEUT#: 4 10*3/uL (ref 1.5–6.5)
NEUT%: 60.5 % (ref 38.4–76.8)
Platelets: 153 10*3/uL (ref 145–400)
RBC: 4.95 10*6/uL (ref 3.70–5.45)
RDW: 13.6 % (ref 11.2–14.5)
WBC: 6.7 10*3/uL (ref 3.9–10.3)
lymph#: 2 10*3/uL (ref 0.9–3.3)

## 2014-12-03 LAB — COMPREHENSIVE METABOLIC PANEL (CC13)
ALT: 17 U/L (ref 0–55)
AST: 33 U/L (ref 5–34)
Albumin: 3.9 g/dL (ref 3.5–5.0)
Alkaline Phosphatase: 56 U/L (ref 40–150)
Anion Gap: 8 mEq/L (ref 3–11)
BUN: 16.8 mg/dL (ref 7.0–26.0)
CO2: 32 mEq/L — ABNORMAL HIGH (ref 22–29)
Calcium: 9.7 mg/dL (ref 8.4–10.4)
Chloride: 103 mEq/L (ref 98–109)
Creatinine: 0.7 mg/dL (ref 0.6–1.1)
EGFR: 87 mL/min/{1.73_m2} — ABNORMAL LOW (ref >90)
Glucose: 171 mg/dl — ABNORMAL HIGH (ref 70–140)
Potassium: 5.2 mEq/L — ABNORMAL HIGH (ref 3.5–5.1)
Sodium: 143 mEq/L (ref 136–145)
Total Bilirubin: 0.95 mg/dL (ref 0.20–1.20)
Total Protein: 6.9 g/dL (ref 6.4–8.3)

## 2014-12-03 LAB — GLUCOSE, CAPILLARY: Glucose-Capillary: 176 mg/dL — ABNORMAL HIGH (ref 65–99)

## 2014-12-03 MED ORDER — FLUDEOXYGLUCOSE F - 18 (FDG) INJECTION
10.7100 | Freq: Once | INTRAVENOUS | Status: AC | PRN
  Administered 2014-12-03: 10.71 via INTRAVENOUS

## 2014-12-03 MED ORDER — IOHEXOL 300 MG/ML  SOLN
100.0000 mL | Freq: Once | INTRAMUSCULAR | Status: AC | PRN
  Administered 2014-12-03: 100 mL via INTRAVENOUS

## 2014-12-07 ENCOUNTER — Other Ambulatory Visit: Payer: Self-pay | Admitting: Oncology

## 2014-12-09 ENCOUNTER — Encounter: Payer: Self-pay | Admitting: Oncology

## 2014-12-09 ENCOUNTER — Ambulatory Visit: Payer: Medicare Other | Admitting: Oncology

## 2014-12-09 ENCOUNTER — Other Ambulatory Visit: Payer: Medicare Other

## 2014-12-09 ENCOUNTER — Other Ambulatory Visit: Payer: Self-pay | Admitting: Oncology

## 2014-12-09 ENCOUNTER — Ambulatory Visit (HOSPITAL_BASED_OUTPATIENT_CLINIC_OR_DEPARTMENT_OTHER): Payer: Medicare Other | Admitting: Oncology

## 2014-12-09 VITALS — BP 156/81 | HR 89 | Temp 98.7°F | Resp 18 | Ht 63.0 in | Wt 224.1 lb

## 2014-12-09 DIAGNOSIS — C50912 Malignant neoplasm of unspecified site of left female breast: Principal | ICD-10-CM

## 2014-12-09 DIAGNOSIS — Z79811 Long term (current) use of aromatase inhibitors: Secondary | ICD-10-CM | POA: Diagnosis not present

## 2014-12-09 DIAGNOSIS — Z85820 Personal history of malignant melanoma of skin: Secondary | ICD-10-CM

## 2014-12-09 DIAGNOSIS — E669 Obesity, unspecified: Secondary | ICD-10-CM | POA: Diagnosis not present

## 2014-12-09 DIAGNOSIS — C50911 Malignant neoplasm of unspecified site of right female breast: Secondary | ICD-10-CM

## 2014-12-09 DIAGNOSIS — C55 Malignant neoplasm of uterus, part unspecified: Secondary | ICD-10-CM

## 2014-12-09 DIAGNOSIS — C782 Secondary malignant neoplasm of pleura: Secondary | ICD-10-CM

## 2014-12-09 DIAGNOSIS — Z8543 Personal history of malignant neoplasm of ovary: Secondary | ICD-10-CM

## 2014-12-09 DIAGNOSIS — J910 Malignant pleural effusion: Secondary | ICD-10-CM

## 2014-12-09 DIAGNOSIS — C569 Malignant neoplasm of unspecified ovary: Secondary | ICD-10-CM

## 2014-12-09 DIAGNOSIS — C4359 Malignant melanoma of other part of trunk: Secondary | ICD-10-CM

## 2014-12-09 DIAGNOSIS — Z79899 Other long term (current) drug therapy: Secondary | ICD-10-CM

## 2014-12-09 NOTE — Progress Notes (Signed)
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 She held letrozole as instructed beginning 11-18-14. She had CT CAP and PET on 12-03-14, with no new findings associated with the marker change. She is to have DEXA in 03-2016. 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   Patient has been very anxious about scans, but otherwise does not notice any new or different problems. Hands have been slightly less stiff since holding letrozole, but no change in joint symptoms otherwise, indicating that these are not related to the aromatase inhibitor. She denies increased SOB or chest discomfort. Appetite and energy are at baseline.    No PAC Patient confirms that she had genetics testing 2010, negative BRCA. CA 2729 was 71 in 04-2010  ONCOLOGIC HISTORY  Bilateral breast cancer treated with bilateral mastectomies and bilateral axillary node dissections by Dr Andrey Campanile in 1997; she recalls that all nodes were negative. She had chemotherapy by Dr Tressie Stalker and bilateral radiation by Dr Danny Lawless. 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 She was begun on letrozole at that time.Note CA 2729 was elevated at time of recurrent disease.  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 She received 6 cycles adjuvant taxol carboplatin thru Nov 2010 and vaginal cuff brachytherapy completed Jan 2011. Note  CA125 was elevated at presentation.  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 No change in bowels. No bleeding.  Remainder of 10 point Review of Systems negative.  Objective:  Vital signs in last 24 hours:  BP 156/81 mmHg  Pulse 89  Temp(Src) 98.7 F (37.1 C) (Oral)  Resp 18  Ht 5' 3" (1.6 m)  Wt 224 lb 1.6 oz (101.651 kg)  BMI 39.71 kg/m2 Weight up 9 lbs. Alert, oriented and appropriate. Ambulatory without assistance.  Alopecia  HEENT:PERRL, sclerae not icteric. Oral mucosa moist without lesions, posterior pharynx clear.  Neck supple. No JVD.  Lymphatics:no cervical,supraclavicular, axillary adenopathy Resp: clear to auscultation bilaterally and normal percussion bilaterally Cardio: regular rate and rhythm. No gallop. GI: abdomen obese, soft, nontender, not distended, no mass or organomegaly. Normally active bowel sounds.  Musculoskeletal/ Extremities: without pitting edema, cords, tenderness Neuro: no peripheral neuropathy. Otherwise nonfocal Skin without rash, ecchymosis, petechiae. Telangiectasias across chest wall at mastectomy sites. Large scar mid upper back without findings of concern for recurrent melanoma Breasts: bilateral mastectomy scars, no evidence of local recurrende.  Axillae benign.  Lab Results: CA 2729  03-2014   32                  09-18-14    42                  11-13-14   52 CBC 12-03-14  WBC 6.7, ANC 4.0, Hgb 14.5, plt 153 CMET 12-03-14   Normal with exception of K 5.2, OC2  32, glu 171  Studies/Results: CT CHEST, ABDOMEN, AND PELVIS WITH CONTRAST   12-03-14  COMPARISON: Concurrent PET-CT dated 12/03/2014. CT chest dated 02/20/2013. CT abdomen pelvis dated 05/15/2012.  FINDINGS: CT CHEST FINDINGS  Mediastinum/Nodes: Mild cardiomegaly. No pericardial effusion.  Coronary atherosclerosis.  No suspicious  mediastinal, hilar, or axillary lymphadenopathy.  Status post bilateral mastectomy in axillary lymph node dissection.  Lungs/Pleura: 3 mm medial right upper lobe pulmonary nodule (series 5/ image 13), unchanged. No new/ suspicious pulmonary nodules.  Scarring in the left upper lobe/lingula, right middle lobe, and left lower lobe.  Trace left pleural effusion. Associated pleural-based thickening/nodularity (series 2/ images 8 and 45), with associated pleural-based calcifications (series 2/image 41), unchanged.  No pneumothorax.  Musculoskeletal: Mild degenerative changes of the thoracic spine.  CT ABDOMEN PELVIS FINDINGS  Hepatobiliary: Liver is within normal limits.  Gallbladder is unremarkable. No intrahepatic or extrahepatic ductal dilatation.  Pancreas: Within normal limits.  Spleen: Spleen is within normal limits. Vascular calcifications in the splenic hilum, unchanged.  Adrenals/Urinary Tract: Adrenal glands are within normal limits.  Kidneys within normal limits. No hydronephrosis.  Bladder is thick-walled although underdistended.  Stomach/Bowel: Stomach is notable for a small hiatal hernia.  No evidence of bowel obstruction.  Extensive colonic diverticulosis, without evidence of diverticulitis  Vascular/Lymphatic: Atherosclerotic calcifications of the abdominal aorta and branch vessels.  No suspicious abdominopelvic lymphadenopathy.  Reproductive: Status post hysterectomy. No adnexal masses.  Other: No abdominopelvic ascites.  Postsurgical changes in the anterior abdominal wall related to prior ventral hernia mesh repair.  Small fat containing bilateral inguinal hernias.  Musculoskeletal: Degenerative changes of the lumbar spine, most prominent at L1-2.  IMPRESSION: Status post bilateral mastectomy with axillary lymph node dissection.  Trace left pleural effusion. Associated pleural-based thickening/nodularity and  calcifications, unchanged. This appearance favors treated metastasis/sequela of prior talc pleurodesis on CT.  No findings specific for recurrent or metastatic disease.    EXAM: NUCLEAR MEDICINE PET WHOLE BODY   12-03-14 COMPARISON: PET-CT dated 03/30/2010. CT chest dated 02/20/2013. CT abdomen pelvis dated 05/15/2012. Concurrent CT chest abdomen pelvis dated 12/03/2014.  FINDINGS: Head/Neck: No hypermetabolic lymph nodes in the neck.  Chest: Mild scarring in the left lower lobe/ lingula. Minimal scarring in the right middle lobe.  No focal consolidation. Bilateral lower lobe atelectasis. No suspicious pulmonary nodules.  Trace left pleural effusion. Associated mild nodular left pleural thickening (series 4/ image 115) with mild hypermetabolism, max SUV 5.7. Mild left posterior pleural-based calcifications (series 4/ image 109), max SUV 6.4. This appearance is similar prior studies and is favored to be related to prior talc pleurodesis, residual pleural-based tumor not excluded.  Status post bilateral mastectomy with bilateral axillary lymph node dissection.  Mild cardiomegaly. No pericardial effusion. Coronary atherosclerosis.  No hypermetabolic thoracic lymphadenopathy.  Abdomen/Pelvis: No abnormal hypermetabolic activity within the liver, pancreas, adrenal glands, or spleen.  Small hiatal hernia. Atherosclerotic calcifications of the aortic arch. Postsurgical changes the anterior abdominal wall related to prior ventral hernia repair. Colonic diverticulosis without evidence of diverticulitis. Status post hysterectomy. Small fat containing bilateral inguinal hernias.  No hypermetabolic lymph nodes in the abdomen or pelvis.  Skeleton: No focal hypermetabolic activity to suggest skeletal metastasis.  Extremities: No hypermetabolic activity to suggest metastasis.  IMPRESSION: Status post bilateral mastectomy with bilateral axillary lymph  node dissection.  Trace left pleural effusion with associated nodular pleural thickening and pleural-based calcifications, similar prior studies, but with mild hypermetabolism. This appearance is favored to be related to prior talc pleurodesis, less likely residual  pleural-based tumor.  PACs images for the CT and PET reviewed with patient and husband now  Medications: I have reviewed the patient's current medications. She will resume letrozole.  DISCUSSION: marker results and findings on imaging reviewed as above. I have explained that we expect at some point that the metastatic breast cancer will progress on present letrozole, at which time it will be appropriate to change to another therapy. As no clear evidence of progression by scans, we will continue letrozole for now, watching marker still. Patient and husband understand that she has had very responsive disease to this point and that there are many other options for treatment, which can be very helpful even tho not curative.   Assessment/Plan:  1.Bilateral breast cancers 1997 treated with bilateral mastectomies and axillary node dissections, adjuvant chemotherapy and radiation. 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 On Letrozole since ~ 04-2010. Slight increase in CA 2729 today and some increased SOB better with prn lasix, no clear progression by CT PET now. WIll continue letrozole and follow. 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 CA 125 stable in normal range in May. She saw Dr Skeet Latch 11-13-14 and will see gyn onc NP in a year. 3. Melanoma upper back 2013, treated with wide excision. Followed by dermatology. 4.normal bone density 02-2013. Needs repeat in 03-2015 as CT PET does not give same information, but have also encouraged her to continue good calcium with  D and weight bearing exercise as she is being treated with aromatase inhibitor. 5. CT chest report 02-2013 of sclerotic lesion in left humeral head, then 1.1 cm compared with 0.9 cm 2011. Plain xrays not remarkable 09-2013, no worsening of symptoms. No mention of this area on either CT chest or PET 11-2014. 6.morbid obesity, BMI now 39.8. Further weight gain needs to be addressed.   All questions answered. Patient and husband are relieved that scans look very stable and are in agreement with follow up as planned. Time spent 25 min including >50% counseling and coordination of care. Cc Drs Allyson Sabal and C.Nat Christen, MD   12/09/2014, 4:25 PM

## 2014-12-10 DIAGNOSIS — Z9889 Other specified postprocedural states: Secondary | ICD-10-CM | POA: Diagnosis not present

## 2014-12-10 LAB — CANCER ANTIGEN 27.29: CA 27.29: 56 U/mL — ABNORMAL HIGH (ref 0–39)

## 2014-12-11 DIAGNOSIS — E669 Obesity, unspecified: Secondary | ICD-10-CM | POA: Insufficient documentation

## 2014-12-11 DIAGNOSIS — Z79899 Other long term (current) drug therapy: Secondary | ICD-10-CM | POA: Insufficient documentation

## 2014-12-11 DIAGNOSIS — C782 Secondary malignant neoplasm of pleura: Secondary | ICD-10-CM

## 2014-12-11 DIAGNOSIS — J910 Malignant pleural effusion: Secondary | ICD-10-CM | POA: Insufficient documentation

## 2014-12-11 DIAGNOSIS — C50919 Malignant neoplasm of unspecified site of unspecified female breast: Secondary | ICD-10-CM | POA: Insufficient documentation

## 2014-12-15 ENCOUNTER — Telehealth: Payer: Self-pay

## 2014-12-15 NOTE — Telephone Encounter (Signed)
-----   Message from Gordy Levan, MD sent at 12/10/2014  9:13 AM EDT ----- Labs seen and need follow up: please let her know marker essentially stable at 56, no change in plan from visit 7-26

## 2014-12-15 NOTE — Telephone Encounter (Signed)
Told Ms. Rabadi the results of the [place] as noted below by Dr, Marko Plume.  Ms. Ramakrishnan verbalized understanding.

## 2014-12-20 ENCOUNTER — Other Ambulatory Visit: Payer: Self-pay | Admitting: Oncology

## 2014-12-20 DIAGNOSIS — C782 Secondary malignant neoplasm of pleura: Principal | ICD-10-CM

## 2014-12-20 DIAGNOSIS — C50919 Malignant neoplasm of unspecified site of unspecified female breast: Secondary | ICD-10-CM

## 2015-01-05 DIAGNOSIS — E1140 Type 2 diabetes mellitus with diabetic neuropathy, unspecified: Secondary | ICD-10-CM | POA: Diagnosis not present

## 2015-01-05 DIAGNOSIS — I700 Atherosclerosis of aorta: Secondary | ICD-10-CM | POA: Diagnosis not present

## 2015-01-05 DIAGNOSIS — M1990 Unspecified osteoarthritis, unspecified site: Secondary | ICD-10-CM | POA: Diagnosis not present

## 2015-01-05 DIAGNOSIS — R600 Localized edema: Secondary | ICD-10-CM | POA: Diagnosis not present

## 2015-01-07 DIAGNOSIS — M25551 Pain in right hip: Secondary | ICD-10-CM | POA: Diagnosis not present

## 2015-01-07 DIAGNOSIS — M549 Dorsalgia, unspecified: Secondary | ICD-10-CM | POA: Diagnosis not present

## 2015-01-13 ENCOUNTER — Other Ambulatory Visit: Payer: Self-pay | Admitting: Orthopaedic Surgery

## 2015-01-13 DIAGNOSIS — M545 Low back pain: Secondary | ICD-10-CM

## 2015-01-24 ENCOUNTER — Ambulatory Visit
Admission: RE | Admit: 2015-01-24 | Discharge: 2015-01-24 | Disposition: A | Discharge status: Home / Self Care | Payer: Medicare Other | Source: Ambulatory Visit | Attending: Orthopaedic Surgery | Admitting: Orthopaedic Surgery

## 2015-01-24 DIAGNOSIS — M5126 Other intervertebral disc displacement, lumbar region: Secondary | ICD-10-CM | POA: Diagnosis not present

## 2015-01-24 DIAGNOSIS — M545 Low back pain: Secondary | ICD-10-CM

## 2015-01-28 DIAGNOSIS — M5441 Lumbago with sciatica, right side: Secondary | ICD-10-CM | POA: Diagnosis not present

## 2015-01-30 DIAGNOSIS — K648 Other hemorrhoids: Secondary | ICD-10-CM | POA: Diagnosis not present

## 2015-01-30 DIAGNOSIS — Z800 Family history of malignant neoplasm of digestive organs: Secondary | ICD-10-CM | POA: Diagnosis not present

## 2015-01-30 DIAGNOSIS — K5730 Diverticulosis of large intestine without perforation or abscess without bleeding: Secondary | ICD-10-CM | POA: Diagnosis not present

## 2015-02-06 DIAGNOSIS — M5416 Radiculopathy, lumbar region: Secondary | ICD-10-CM | POA: Diagnosis not present

## 2015-02-24 DIAGNOSIS — M5416 Radiculopathy, lumbar region: Secondary | ICD-10-CM | POA: Diagnosis not present

## 2015-03-04 DIAGNOSIS — Z23 Encounter for immunization: Secondary | ICD-10-CM | POA: Diagnosis not present

## 2015-03-06 DIAGNOSIS — L820 Inflamed seborrheic keratosis: Secondary | ICD-10-CM | POA: Diagnosis not present

## 2015-03-06 DIAGNOSIS — L821 Other seborrheic keratosis: Secondary | ICD-10-CM | POA: Diagnosis not present

## 2015-03-14 ENCOUNTER — Other Ambulatory Visit: Payer: Self-pay | Admitting: Oncology

## 2015-03-17 ENCOUNTER — Ambulatory Visit
Admission: RE | Admit: 2015-03-17 | Discharge: 2015-03-17 | Disposition: A | Discharge status: Home / Self Care | Payer: Medicare Other | Source: Ambulatory Visit | Attending: Oncology | Admitting: Oncology

## 2015-03-17 DIAGNOSIS — Z780 Asymptomatic menopausal state: Secondary | ICD-10-CM | POA: Diagnosis not present

## 2015-03-17 DIAGNOSIS — E2839 Other primary ovarian failure: Secondary | ICD-10-CM

## 2015-03-19 DIAGNOSIS — M5416 Radiculopathy, lumbar region: Secondary | ICD-10-CM | POA: Diagnosis not present

## 2015-03-20 ENCOUNTER — Other Ambulatory Visit: Payer: Self-pay

## 2015-03-20 DIAGNOSIS — C782 Secondary malignant neoplasm of pleura: Secondary | ICD-10-CM

## 2015-03-20 DIAGNOSIS — C50919 Malignant neoplasm of unspecified site of unspecified female breast: Secondary | ICD-10-CM

## 2015-03-20 DIAGNOSIS — C55 Malignant neoplasm of uterus, part unspecified: Secondary | ICD-10-CM

## 2015-03-20 DIAGNOSIS — C569 Malignant neoplasm of unspecified ovary: Secondary | ICD-10-CM

## 2015-03-22 ENCOUNTER — Other Ambulatory Visit: Payer: Self-pay | Admitting: Oncology

## 2015-03-23 ENCOUNTER — Encounter: Payer: Self-pay | Admitting: Oncology

## 2015-03-23 ENCOUNTER — Ambulatory Visit (HOSPITAL_BASED_OUTPATIENT_CLINIC_OR_DEPARTMENT_OTHER): Payer: Medicare Other | Admitting: Oncology

## 2015-03-23 ENCOUNTER — Other Ambulatory Visit (HOSPITAL_BASED_OUTPATIENT_CLINIC_OR_DEPARTMENT_OTHER): Payer: Medicare Other

## 2015-03-23 ENCOUNTER — Telehealth: Payer: Self-pay | Admitting: Oncology

## 2015-03-23 VITALS — BP 142/78 | HR 82 | Temp 99.1°F | Resp 18 | Ht 63.0 in | Wt 229.8 lb

## 2015-03-23 DIAGNOSIS — C782 Secondary malignant neoplasm of pleura: Secondary | ICD-10-CM

## 2015-03-23 DIAGNOSIS — C50911 Malignant neoplasm of unspecified site of right female breast: Secondary | ICD-10-CM

## 2015-03-23 DIAGNOSIS — Z8543 Personal history of malignant neoplasm of ovary: Secondary | ICD-10-CM | POA: Diagnosis not present

## 2015-03-23 DIAGNOSIS — C50919 Malignant neoplasm of unspecified site of unspecified female breast: Secondary | ICD-10-CM | POA: Diagnosis not present

## 2015-03-23 DIAGNOSIS — Z8541 Personal history of malignant neoplasm of cervix uteri: Secondary | ICD-10-CM | POA: Diagnosis not present

## 2015-03-23 DIAGNOSIS — C55 Malignant neoplasm of uterus, part unspecified: Secondary | ICD-10-CM

## 2015-03-23 DIAGNOSIS — C569 Malignant neoplasm of unspecified ovary: Secondary | ICD-10-CM

## 2015-03-23 DIAGNOSIS — C50912 Malignant neoplasm of unspecified site of left female breast: Secondary | ICD-10-CM

## 2015-03-23 DIAGNOSIS — Z79899 Other long term (current) drug therapy: Secondary | ICD-10-CM

## 2015-03-23 DIAGNOSIS — J910 Malignant pleural effusion: Secondary | ICD-10-CM

## 2015-03-23 DIAGNOSIS — Z6841 Body Mass Index (BMI) 40.0 and over, adult: Secondary | ICD-10-CM

## 2015-03-23 LAB — COMPREHENSIVE METABOLIC PANEL (CC13)
ALT: 24 U/L (ref 0–55)
AST: 28 U/L (ref 5–34)
Albumin: 3.5 g/dL (ref 3.5–5.0)
Alkaline Phosphatase: 62 U/L (ref 40–150)
Anion Gap: 9 mEq/L (ref 3–11)
BUN: 10.6 mg/dL (ref 7.0–26.0)
CO2: 31 mEq/L — ABNORMAL HIGH (ref 22–29)
Calcium: 9.6 mg/dL (ref 8.4–10.4)
Chloride: 105 mEq/L (ref 98–109)
Creatinine: 0.7 mg/dL (ref 0.6–1.1)
EGFR: 88 mL/min/{1.73_m2} — ABNORMAL LOW (ref >90)
Glucose: 150 mg/dl — ABNORMAL HIGH (ref 70–140)
Potassium: 4.6 mEq/L (ref 3.5–5.1)
Sodium: 144 mEq/L (ref 136–145)
Total Bilirubin: 0.75 mg/dL (ref 0.20–1.20)
Total Protein: 6.6 g/dL (ref 6.4–8.3)

## 2015-03-23 LAB — CBC WITH DIFFERENTIAL/PLATELET
BASO%: 0.3 % (ref 0.0–2.0)
Basophils Absolute: 0 10*3/uL (ref 0.0–0.1)
EOS%: 2.5 % (ref 0.0–7.0)
Eosinophils Absolute: 0.2 10*3/uL (ref 0.0–0.5)
HCT: 42.3 % (ref 34.8–46.6)
HGB: 13.7 g/dL (ref 11.6–15.9)
LYMPH%: 28.2 % (ref 14.0–49.7)
MCH: 28.9 pg (ref 25.1–34.0)
MCHC: 32.4 g/dL (ref 31.5–36.0)
MCV: 89.2 fL (ref 79.5–101.0)
MONO#: 0.5 10*3/uL (ref 0.1–0.9)
MONO%: 7 % (ref 0.0–14.0)
NEUT#: 4.2 10*3/uL (ref 1.5–6.5)
NEUT%: 62 % (ref 38.4–76.8)
Platelets: 168 10*3/uL (ref 145–400)
RBC: 4.74 10*6/uL (ref 3.70–5.45)
RDW: 13.3 % (ref 11.2–14.5)
WBC: 6.7 10*3/uL (ref 3.9–10.3)
lymph#: 1.9 10*3/uL (ref 0.9–3.3)

## 2015-03-23 NOTE — Progress Notes (Signed)
OFFICE PROGRESS NOTE   March 23, 2015   Physicians::( Marcy Panning), [name], [name], [place], Candace [place], [place] Kinard), [facility], [place].Dahldorf/ Mina [employer]  INTERVAL HISTORY:   Patient is seen, alone for visit, in continuing attention to metastatic breast cancer involving pleura, for which she has been on Femara since 04-2010. She had some elevation in CA 2729 in 11-2014, with CT/PET then no evidence of progression. Repeat marker pending at time of visit today.  DEXA 03-17-15 normal bone density in femoral neck, could not evaluate LS spine due to degenerative changes. She has history of endometrial and ovarian cancers in 2010, and melanoma upper back 2013.Marland Kitchen  Patient has had more problems with chronic arthritis symptoms in last few weeks, with MRI LS 01-25-15 by Dr Novella Olive with degenerative changes and disc disease, no metastatic disease noted. She had injection low back (or right SI area ?) not helpful as yet; symptoms also feet and knees particularly.  She denies increased SOB, cough, wheezing or chest pain; she has had slight URI congestion suggesting allergies. Appetite is good, no abdominal or pelvic discomfort, bowels moving regularly No bleeding. No fever. No bladder symptoms. No increased LE swelling. To follow up with podiatrist upcoming. Remainder of 10 point Review of Systems negative.   No PAC Patient confirms that she had genetics testing 2010, negative BRCA. CA 2729 was 71 in 04-2010  ONCOLOGIC HISTORY  Bilateral breast cancer treated with bilateral mastectomies and bilateral axillary node dissections by Dr Andrey Campanile in 1997; she recalls that all nodes were negative. She had chemotherapy by Dr Tressie Stalker and bilateral radiation by Dr Danny Lawless. 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 She was begun on letrozole  at that time.Note CA 2729 was elevated at time of recurrent disease.  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 She received 6 cycles adjuvant taxol carboplatin thru Nov 2010 and vaginal cuff brachytherapy completed Jan 2011. Note CA125 was elevated at presentation.  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 Ambulatory slowly, able to get on and off exam table with some assistance. Marland Kitchen    HEENT:PERRL, sclerae not icteric. Oral mucosa moist without lesions, posterior pharynx clear.  Neck supple. No JVD.  Lymphatics:no cervical,supraclavicular, axillary or inguinal adenopathy Resp: clear to auscultation bilaterally and normal percussion bilaterally Cardio: regular rate and rhythm. No gallop. GI: abdomen obese, soft, nontender, not obviously distended, no appreciable mass or organomegaly. Normally active bowel sounds. Surgical incision not remarkable. Musculoskeletal/ Extremities:LE and UE without pitting edema, cords, tenderness. Point tender at upper right SI area reproducing pain.  Neuro: speech fluent, nonfocal. PSYCH appropriate mood and affect Skin Large scar from melanoma excision upper mid back without evidence of local recurrence, otherwise without rash including low back and right flank area, no ecchymosis, petechiae Breasts: bilateral mastectomy scars with telangiectasias in radiation area but nothing of concern for local recurrence. Axillae benign.   Lab Results:  Results for orders placed or performed in visit on 03/23/15  CBC with Differential  Result Value Ref Range   WBC 6.7 3.9  - 10.3 10e3/uL   NEUT# 4.2 1.5 - 6.5 10e3/uL   HGB 13.7 11.6 - 15.9 g/dL   HCT 42.3 34.8 - 46.6 %   Platelets 168 145 - 400 10e3/uL   MCV 89.2 79.5 - 101.0 fL   MCH 28.9 25.1 - 34.0 pg   MCHC 32.4 31.5 - 36.0 g/dL   RBC 4.74 3.70 - 5.45 10e6/uL   RDW 13.3 11.2 - 14.5 %   lymph# 1.9 0.9 - 3.3 10e3/uL   MONO# 0.5 0.1 - 0.9 10e3/uL   Eosinophils Absolute 0.2 0.0 - 0.5 10e3/uL   Basophils Absolute 0.0 0.0 - 0.1 10e3/uL   NEUT% 62.0 38.4 - 76.8 %   LYMPH% 28.2 14.0 - 49.7 %   MONO% 7.0 0.0 - 14.0 %   EOS% 2.5 0.0 - 7.0 %   BASO% 0.3 0.0 - 2.0 %   CMET available after visit normal with exception of nonfasting glucose 150 and CO2 31, including normal LFTs, normal alk phos at 62 and calcium 9.6, total protein 6.6 and albumin 3.5  CA 125 available after visit stable at 12 compared with 09-2014  CA 2729 resulted after visit at 23, this having been 24 in 11-2014 and 42 on 09-18-14.  Studies/Results: MRI LUMBAR SPINE WITHOUT CONTRAST  01-24-15  COMPARISON: CT of the abdomen and pelvis 12/03/2014.  FINDINGS: Normal signal is present in the conus medullaris which terminates at T12-L1, within normal limits. Chronic endplate marrow changes are again noted at L1-2. There slight retrolisthesis at L2-3 and L3-4. Grade 1 anterolisthesis at L4-5 measures 6 mm, within normal limits.  Limited imaging the abdomen is unremarkable. There is no significant adenopathy.  T12-L1: Slight disc bulging is present without significant stenosis.  L1-2: A broad-based disc protrusion is present. Mild facet hypertrophy is noted bilaterally. Central canal is patent. Mild right foraminal narrowing is present.  L2-3: A mild broad-based disc protrusion is present. Mild facet hypertrophy is noted bilaterally. There is no significant stenosis.  L3-4: A mild broad-based disc protrusion is present. Facet hypertrophy contributes to mild foraminal stenosis bilaterally.  L4-5: A broad-based disc protrusion is  present. Advanced facet hypertrophy is noted bilaterally. This results in mild subarticular stenosis, right greater than left. Mild foraminal narrowing is worse on the right.  L5-S1: A central disc protrusion is present. Annular tear suspected. No focal stenosis is present.  IMPRESSION: 1. Chronic endplate marrow changes and broad-based disc protrusion with mild right foraminal narrowing at L1-2. 2. Mild subarticular and foraminal narrowing bilaterally at L4-5 associated with anterolisthesis and uncovering of a broad-based disc protrusion is also worse on the right. 3. Mild foraminal narrowing bilaterally at L3-4. 4. Central disc protrusion at L5-S1 without significant stenosis. 5. Disc protrusion and facet hypertrophy at L2-3 without significant stenosis.       EXAM: DUAL X-RAY ABSORPTIOMETRY (DXA) FOR BONE MINERAL DENSITY  IMPRESSION: Referring Physician: Gordy Levan  PATIENT: Name: Brittany Porter, Brittany Porter Patient ID: [id number] Birth Date: 17-Mar-1946 Height: 63.0 in. Sex: Female Measured: 03/17/2015 Weight: 224.1 lbs. Indications: Bilateral Ovariectomy (65.51), Breast Cancer History, Caucasian, Estrogen Deficient, Hysterectomy, Letrozole, Postmenopausal Fractures: Treatments: Calcium (E943.0), Vitamin D (E933.5)  ASSESSMENT: The BMD measured at Femur Neck Left is 1.012 g/cm2 with a T-score of -0.2. This patient is considered NORMAL according to [facility]) criteria. Lumbar spine was not utilized due  to advanced degenerative changes.  There has been a statistically significant increase in BMD of the left hip since prior exam dated 03/13/2013.        Medications: I have reviewed the patient's current medications.  DISCUSSION No obvious concern for progressive metastatic breast cancer by exam, particularly given MRI LS in past 6 weeks. Patient aware that we will let her know results of marker. Some arthralgias may be exacerbated by Femara,  especially in feet.   Encouraged her to consider improving weight, as this is not helping arthritis. She has done water exercises in past, may consider this. Discussed diet.     Assessment/Plan:  1.Bilateral breast cancers 1997 treated with bilateral mastectomies and axillary node dissections, adjuvant chemotherapy and radiation. 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 On Letrozole since ~ 04-2010. No clear progression by CT PET 11-2014 done with slight increase in CA 2729, and marker resulted after visit now further increased.  WIll discuss with patient by phone, expect best to change to another hormone blocker, possibly tamoxifen as this would not have arthralgia side effects.  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 CA 125 stable in normal range today. She saw Dr Skeet Latch 11-13-14 and will see gyn onc annually. 3. Melanoma upper back 2013, treated with wide excision. Followed by dermatology. 4.normal bone density 03-2015 even with ongoing aromatase inhibitor 5. CT chest report 02-2013 of sclerotic lesion in left humeral head, then 1.1 cm compared with 0.9 cm 2011. Plain xrays not remarkable 09-2013, no worsening of symptoms. No mention of this area on either CT chest or PET 11-2014. 6.morbid obesity, BMI now 40. Further weight gain needs to be addressed. 7.flu vaccine 03-04-15  All questions answered and patient is in agreement with recommendations and plans. Time spent 25 min including >50% counseling and coordination of care. Cc Dr Nat Christen, MD   03/23/2015, 10:10 AM

## 2015-03-23 NOTE — Telephone Encounter (Signed)
Appointments made and avs printed for patient °

## 2015-03-24 LAB — CA 125: CA 125: 12 U/mL (ref <35)

## 2015-03-24 LAB — CANCER ANTIGEN 27.29: CA 27.29: 78 U/mL — ABNORMAL HIGH (ref 0–39)

## 2015-03-27 NOTE — Telephone Encounter (Signed)
Chart reviewed.

## 2015-03-29 DIAGNOSIS — C50911 Malignant neoplasm of unspecified site of right female breast: Secondary | ICD-10-CM | POA: Insufficient documentation

## 2015-03-29 DIAGNOSIS — Z6841 Body Mass Index (BMI) 40.0 and over, adult: Secondary | ICD-10-CM

## 2015-03-29 DIAGNOSIS — C50912 Malignant neoplasm of unspecified site of left female breast: Secondary | ICD-10-CM

## 2015-03-31 ENCOUNTER — Telehealth: Payer: Self-pay | Admitting: Oncology

## 2015-03-31 ENCOUNTER — Ambulatory Visit (INDEPENDENT_AMBULATORY_CARE_PROVIDER_SITE_OTHER): Payer: Medicare Other | Admitting: Podiatry

## 2015-03-31 ENCOUNTER — Encounter: Payer: Self-pay | Admitting: Podiatry

## 2015-03-31 ENCOUNTER — Ambulatory Visit (INDEPENDENT_AMBULATORY_CARE_PROVIDER_SITE_OTHER): Payer: Medicare Other

## 2015-03-31 VITALS — BP 143/68 | HR 87 | Resp 18

## 2015-03-31 DIAGNOSIS — M19072 Primary osteoarthritis, left ankle and foot: Secondary | ICD-10-CM

## 2015-03-31 DIAGNOSIS — R52 Pain, unspecified: Secondary | ICD-10-CM | POA: Diagnosis not present

## 2015-03-31 DIAGNOSIS — M8430XA Stress fracture, unspecified site, initial encounter for fracture: Secondary | ICD-10-CM | POA: Diagnosis not present

## 2015-03-31 NOTE — Telephone Encounter (Signed)
Medical Oncology  No answer at home # listed [phone] LM on cell [phone] asking patient to call back to office to speak with Dr Marko Plume or RN. If she returns call to RN, need to let her know that the CA 2729 marker again was some higher, at 78. I would like her to stop the Femara, which she has been on since 04-2010. I suggest changing to another hormone blocker, which I am glad to talk with her about.   Godfrey Pick, MD

## 2015-03-31 NOTE — Telephone Encounter (Signed)
Mrs. Bacote called returning provider call.  This nurse informed her of Ca 27.29 results and tro stop the Femara.  Asked that Dr. Marko Plume call her back at [phone] cell or home [phone] to discuss another hormone blocker.

## 2015-04-01 DIAGNOSIS — M19079 Primary osteoarthritis, unspecified ankle and foot: Secondary | ICD-10-CM | POA: Insufficient documentation

## 2015-04-01 NOTE — Progress Notes (Signed)
Patient ID: Brittany Porter, female   DOB: 27-May-1945, 69 y.o.   MRN: [id number]  Subjective: 69 year old female presents to the office symptoms and left foot pain which has been ongoing for several months. She states that she is seeing another physician for this and she recently underwent exostectomy of the top of her right foot by orthopedics. She states that she has continued to have quite a bit of pain to her left foot. She denies any recent injury or trauma. She is having increased pain as well to the front of her foot for which she points the metatarsals mostly on the second metatarsal over last couple weeks. She states that she has some intermittent swelling to the area. No redness or warmth. No other complaints at this time.  Objective: AAO 3, NAD DP/PT pulses 2/4, CRT less than 3 seconds At the sensation test with Thornell Mule monofilament, Achilles tendon reflex intact. Scars seen overlying the dorsal aspect of the left midfoot from recent surgery which is well-healed. There is no tenderness to palpation along this area. There is mild diffuse tenderness along the forefoot but mostly on the second metatarsal diaphysis. There is trace edema along this area without any associated erythema or increase in warmth. There is no other specific area pinpoint bony tenderness or pain to vibratory sensation bilateral lower extremities. MMT 5/5, ROM WNL.  No open lesions or pre-ulcerative lesions identified bilaterally. There is no pain with calf compression, swelling, warmth, erythema.  Assessment: 69 year old female left foot pain, possible early stress fracture second metatarsal  Plan: -Treatment options discussed including all alternatives, risks, and complications -X-rays were obtained and reviewed with the patient. There is a partial area of early periosteal reaction of the diaphysis of the second metatarsal concerning for possible stress fracture as well as he does have pain directly overlying  this area. He'll do the surgery recommended immobilization and surgical shoe for which showed he has at home. I will see her back in 2 weeks and repeat x-rays. Discussed with her that she ultimately would like they need some, custom orthotic, we will hold off on that until next appointment.  Celesta Gentile, DPM

## 2015-04-01 NOTE — Telephone Encounter (Signed)
Told Ms. Grunwald that Dr. Marko Plume was given message to call her back.  Dr. Marko Plume will communicate with her or have an appointment set up to discuss new medication.  Ms. Souders appreciative of message.

## 2015-04-03 ENCOUNTER — Telehealth: Payer: Self-pay | Admitting: Oncology

## 2015-04-03 ENCOUNTER — Other Ambulatory Visit: Payer: Self-pay | Admitting: Oncology

## 2015-04-03 NOTE — Telephone Encounter (Signed)
Medical Oncology  Able to reach patient by cell phone now. She stopped Femara as directed when RN spoke with her on 04-01-15.  I explained that she seems to have reached maximum benefit from the letrozole as evidenced by gradual further rise in ca 27-29 marker, tho she has done very well on this for an extended time until now. We discussed changing to another hormone blocker, either another aromatase inhibitor or tamoxifen. With joint and arthritis problems, I have recommended trying tamoxifen now.  She understands that this office will be in touch with her to set up return apt in Dec, and that we will send prescription to her mail order pharmacy if possible. She will wait at least 2 weeks off of the letrozole before starting new medication  Patient has early stress fracture in left foot per podiatrist, now in boot and to be seen again with xray in 3 weeks.  Patient appreciated call.  Godfrey Pick, MD

## 2015-04-03 NOTE — Telephone Encounter (Signed)
Medical Oncology  Tried again to reach patient on both cell and home #s now, LM on cell for her to call back to my office cell today; no answer on home #.  Have sent POF to scheduler to set up another appointment, as having difficulty reaching her.  Godfrey Pick, MD

## 2015-04-05 ENCOUNTER — Telehealth: Payer: Self-pay

## 2015-04-05 DIAGNOSIS — C55 Malignant neoplasm of uterus, part unspecified: Secondary | ICD-10-CM

## 2015-04-05 MED ORDER — TAMOXIFEN CITRATE 20 MG PO TABS: 20.0000 mg | ORAL_TABLET | Freq: Every day | ORAL | Status: DC

## 2015-04-05 NOTE — Telephone Encounter (Signed)
Prescription e-prescribed as noted below by Dr. Marko Plume.

## 2015-04-05 NOTE — Telephone Encounter (Signed)
-----   Message from Gordy Levan, MD sent at 04/03/2015  4:36 PM EST ----- Please send to her mail order pharmacy  Tamoxifen 20 mg   One daily #90 for 90 days if they will do that  thanks

## 2015-04-06 ENCOUNTER — Telehealth: Payer: Self-pay | Admitting: Oncology

## 2015-04-06 NOTE — Telephone Encounter (Signed)
Called patient and she is aware of her December appointment

## 2015-04-14 ENCOUNTER — Ambulatory Visit (INDEPENDENT_AMBULATORY_CARE_PROVIDER_SITE_OTHER): Payer: Medicare Other

## 2015-04-14 ENCOUNTER — Encounter: Payer: Self-pay | Admitting: Podiatry

## 2015-04-14 ENCOUNTER — Ambulatory Visit (INDEPENDENT_AMBULATORY_CARE_PROVIDER_SITE_OTHER): Payer: Medicare Other | Admitting: Podiatry

## 2015-04-14 VITALS — BP 170/67 | HR 88 | Resp 18

## 2015-04-14 DIAGNOSIS — R52 Pain, unspecified: Secondary | ICD-10-CM

## 2015-04-14 DIAGNOSIS — M7742 Metatarsalgia, left foot: Secondary | ICD-10-CM

## 2015-04-14 DIAGNOSIS — M2140 Flat foot [pes planus] (acquired), unspecified foot: Secondary | ICD-10-CM

## 2015-04-14 DIAGNOSIS — M779 Enthesopathy, unspecified: Secondary | ICD-10-CM

## 2015-04-14 DIAGNOSIS — M19072 Primary osteoarthritis, left ankle and foot: Secondary | ICD-10-CM

## 2015-04-15 DIAGNOSIS — M5416 Radiculopathy, lumbar region: Secondary | ICD-10-CM | POA: Diagnosis not present

## 2015-04-15 NOTE — Progress Notes (Signed)
Patient ID: Brittany Porter, female   DOB: [birth year], 69 y.o.   MRN: [id number]  Subjective: 69 year old female presents to the office today for follow-up evaluation of left foot pain which has been ongoing for several months. Since last appointment she has remained in the surgical shoe and she states her pain is about the same. She did recently undergo an exostectomy with orthopedic surgery. She states that since then she has had continued pain to the top of her foot however not overlying the side of the prominent bone spur and she also has pain in the ball of her foot. She denies any recent injury trauma. No swelling or redness. No other complaints at this time.  Objective: AAO 3, NAD DP/PT pulses 2/4, CRT less than 3 seconds Protective sensation intact with Semmes Weinstein monofilament, Achilles tendon reflex intact. Scars seen overlying the dorsal aspect of the left midfoot from recent surgery which is well-healed. There is no tenderness to palpation along this area. There is tenderness palpation along the medial aspect of the midfoot on the dorsal aspect of the foot medial to the incision on the first metatarsal cuneiform joint area. There is a palpable exostosis overlying that area. There is also diffuse tenderness along the metatarsal heads 1 through 3 with atrophy of the fat pad and prompt submetatarsal heads. There is no specific area pinpoint bony tenderness and there is no pain vibratory sensation at this time. No other areas of tenderness to bilateral lower extremities. MMT 5/5, ROM WNL.  No open lesions or pre-ulcerative lesions identified bilaterally. There is no pain with calf compression, swelling, warmth, erythema.  Assessment: 69 year old female left foot pain, as a result of osteoarthritis.  Plan: -Treatment options discussed including all alternatives, risks, and complications -X-rays were obtained and reviewed with the patient. This time there is no definitive evidence of acute  fracture or stress fracture. Arthritic changes are present in the midfoot. -I discussed with her steroid injection over the area of maximal tenderness on the proximal medial midfoot. Discussed risks, complications for which he understands he verbally consents. Under sterile conditions a total 1.5 mL of Kenalog and Marcaine plain was infiltrated into the areas marked mild tenderness with ankle, complications. Post injection care was discussed. -Metatarsal offloading pads dispensed. -Supportive shoe gear. -Prescription for orthotics were given for [facility]. -Follow-up after inserts or sooner if any problems arise. In the meantime, encouraged to call the office with any questions, concerns, change in symptoms.   Celesta Gentile, DPM

## 2015-05-04 ENCOUNTER — Ambulatory Visit: Payer: Medicare Other | Admitting: Oncology

## 2015-05-04 ENCOUNTER — Other Ambulatory Visit: Payer: Medicare Other

## 2015-05-05 ENCOUNTER — Other Ambulatory Visit: Payer: Self-pay | Admitting: *Deleted

## 2015-05-05 DIAGNOSIS — C569 Malignant neoplasm of unspecified ovary: Secondary | ICD-10-CM

## 2015-05-05 DIAGNOSIS — C782 Secondary malignant neoplasm of pleura: Secondary | ICD-10-CM

## 2015-05-05 DIAGNOSIS — C50912 Malignant neoplasm of unspecified site of left female breast: Secondary | ICD-10-CM

## 2015-05-06 ENCOUNTER — Other Ambulatory Visit: Payer: Self-pay | Admitting: Oncology

## 2015-05-07 ENCOUNTER — Encounter: Payer: Self-pay | Admitting: Oncology

## 2015-05-07 ENCOUNTER — Other Ambulatory Visit (HOSPITAL_BASED_OUTPATIENT_CLINIC_OR_DEPARTMENT_OTHER): Payer: Medicare Other

## 2015-05-07 ENCOUNTER — Ambulatory Visit (HOSPITAL_BASED_OUTPATIENT_CLINIC_OR_DEPARTMENT_OTHER): Payer: Medicare Other | Admitting: Oncology

## 2015-05-07 VITALS — BP 132/72 | HR 84 | Temp 98.5°F | Resp 18 | Ht 63.0 in | Wt 225.2 lb

## 2015-05-07 DIAGNOSIS — Z79811 Long term (current) use of aromatase inhibitors: Secondary | ICD-10-CM | POA: Diagnosis not present

## 2015-05-07 DIAGNOSIS — Z8541 Personal history of malignant neoplasm of cervix uteri: Secondary | ICD-10-CM

## 2015-05-07 DIAGNOSIS — Z8543 Personal history of malignant neoplasm of ovary: Secondary | ICD-10-CM | POA: Diagnosis not present

## 2015-05-07 DIAGNOSIS — C50912 Malignant neoplasm of unspecified site of left female breast: Secondary | ICD-10-CM

## 2015-05-07 DIAGNOSIS — C55 Malignant neoplasm of uterus, part unspecified: Secondary | ICD-10-CM

## 2015-05-07 DIAGNOSIS — C782 Secondary malignant neoplasm of pleura: Secondary | ICD-10-CM

## 2015-05-07 DIAGNOSIS — C561 Malignant neoplasm of right ovary: Secondary | ICD-10-CM

## 2015-05-07 DIAGNOSIS — C569 Malignant neoplasm of unspecified ovary: Secondary | ICD-10-CM

## 2015-05-07 DIAGNOSIS — M19079 Primary osteoarthritis, unspecified ankle and foot: Secondary | ICD-10-CM

## 2015-05-07 LAB — COMPREHENSIVE METABOLIC PANEL
ALT: 15 U/L (ref 0–55)
AST: 21 U/L (ref 5–34)
Albumin: 3.3 g/dL — ABNORMAL LOW (ref 3.5–5.0)
Alkaline Phosphatase: 62 U/L (ref 40–150)
Anion Gap: 8 mEq/L (ref 3–11)
BUN: 13.5 mg/dL (ref 7.0–26.0)
CO2: 30 mEq/L — ABNORMAL HIGH (ref 22–29)
Calcium: 8.8 mg/dL (ref 8.4–10.4)
Chloride: 106 mEq/L (ref 98–109)
Creatinine: 0.8 mg/dL (ref 0.6–1.1)
EGFR: 76 mL/min/{1.73_m2} — ABNORMAL LOW (ref >90)
Glucose: 238 mg/dl — ABNORMAL HIGH (ref 70–140)
Potassium: 4.5 mEq/L (ref 3.5–5.1)
Sodium: 144 mEq/L (ref 136–145)
Total Bilirubin: 0.81 mg/dL (ref 0.20–1.20)
Total Protein: 6.5 g/dL (ref 6.4–8.3)

## 2015-05-07 LAB — CBC WITH DIFFERENTIAL/PLATELET
BASO%: 0.7 % (ref 0.0–2.0)
Basophils Absolute: 0.1 10*3/uL (ref 0.0–0.1)
EOS%: 1.6 % (ref 0.0–7.0)
Eosinophils Absolute: 0.1 10*3/uL (ref 0.0–0.5)
HCT: 39.4 % (ref 34.8–46.6)
HGB: 12.8 g/dL (ref 11.6–15.9)
LYMPH%: 20.3 % (ref 14.0–49.7)
MCH: 28.7 pg (ref 25.1–34.0)
MCHC: 32.4 g/dL (ref 31.5–36.0)
MCV: 88.6 fL (ref 79.5–101.0)
MONO#: 0.5 10*3/uL (ref 0.1–0.9)
MONO%: 6.7 % (ref 0.0–14.0)
NEUT#: 5.4 10*3/uL (ref 1.5–6.5)
NEUT%: 70.7 % (ref 38.4–76.8)
Platelets: 168 10*3/uL (ref 145–400)
RBC: 4.44 10*6/uL (ref 3.70–5.45)
RDW: 14.5 % (ref 11.2–14.5)
WBC: 7.6 10*3/uL (ref 3.9–10.3)
lymph#: 1.5 10*3/uL (ref 0.9–3.3)

## 2015-05-07 MED ORDER — HYDROCODONE-ACETAMINOPHEN 5-325 MG PO TABS: 1.0000 | ORAL_TABLET | ORAL | Status: DC | PRN

## 2015-05-07 MED ORDER — TAMOXIFEN CITRATE 20 MG PO TABS: 20.0000 mg | ORAL_TABLET | Freq: Every day | ORAL | Status: DC

## 2015-05-07 NOTE — Progress Notes (Signed)
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 She had been on Femara from 04-2010 thru 04-03-15. She has history of endometrial and ovarian cancers 2010, and melanoma upper back 2013.  Patient is tolerating tamoxifen without difficulty. She notices less stiffness in hands since off of aromatase inhibitor, tho arthritis symptoms otherwise are unchanged. She denies increased SOB, chest pain, cough or wheezing. She has tried to cut back on portion sizes, intentional weight loss with this of almost 5 lbs. She denies abdominal or pelvic pain. Bowels are moving regularly. No fever or symptoms of infection. No new or different pain. Most discomfort now is with left foot, known to Dr Latanya Maudlin (spur removed from that foot) and now seeing podiatrist. No bleeding. No swelling LE. No changes at mastectomy sites.  Remainder of 10 point Review of Systems negative/ unchanged.   No PAC Patient confirms that she had genetics testing 2010, negative BRCA. CA 2729 was 71 in 04-2010  ONCOLOGIC HISTORY Bilateral breast cancer treated with bilateral mastectomies and bilateral axillary node dissections by Dr Andrey Campanile in 1997; she recalls that all nodes were negative. She had chemotherapy by Dr Tressie Stalker and bilateral radiation by Dr Danny Lawless. 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 She was begun on letrozole at that time.Note CA 2729 was elevated at time of recurrent disease. She continued letrozole unitl rising  CA 2729 in 03-2015, with hormonal blocker changed to tamoxifen 04-05-15.   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 She received 6 cycles adjuvant taxol carboplatin thru Nov 2010 and vaginal cuff brachytherapy completed Jan 2011. Note CA125 was elevated at presentation.  Melanoma upper mid back treated with wide local excision 02-2012 ( that path not located in EMR )   Objective:  Vital signs in last 24 hours:  BP 132/72 mmHg  Pulse 84  Temp(Src) 98.5 F (36.9 C) (Oral)  Resp 18  Ht '5\' 3"'  (1.6 m)  Wt 225 lb 3.2 oz (102.15 kg)  BMI 39.90 kg/m2  SpO2 100% Weight down 4.5 lbs Alert, oriented and appropriate. Ambulatory slowly without assistance  HEENT:PERRL, sclerae not icteric. Oral mucosa moist without lesions, posterior pharynx clear.  No JVD.  Lymphatics:no cervical,supaclavicular, axillary or inguinal adenopathy Resp: clear to auscultation bilaterally and normal percussion bilaterally Cardio: regular rate and rhythm. No gallop. GI: abdomen obese, soft, nontender, no appreciable mass or organomegaly. Normally active bowel sounds. Surgical incision not remarkable. Musculoskeletal/ Extremities: without pitting edema, cords. Back not tender.  Neuro: no peripheral neuropathy. PSYCH appropriate mood and affect Skin without rash, ecchymosis, petechiae Breasts: Bilateral mastectomy scars without evidence of local recurrence. Axillae benign.  Lab Results:  Results for orders placed or performed in visit on 05/07/15  CBC with Differential  Result Value Ref Range   WBC 7.6 3.9 - 10.3 10e3/uL   NEUT# 5.4 1.5 - 6.5 10e3/uL   HGB 12.8 11.6 - 15.9 g/dL   HCT 39.4 34.8 - 46.6 %   Platelets 168 145 - 400 10e3/uL   MCV 88.6 79.5 -  101.0 fL   MCH 28.7 25.1 - 34.0 pg   MCHC 32.4 31.5 - 36.0 g/dL   RBC 4.44 3.70 - 5.45 10e6/uL   RDW 14.5 11.2 - 14.5 %   lymph# 1.5 0.9 - 3.3 10e3/uL   MONO# 0.5 0.1 - 0.9 10e3/uL    Eosinophils Absolute 0.1 0.0 - 0.5 10e3/uL   Basophils Absolute 0.1 0.0 - 0.1 10e3/uL   NEUT% 70.7 38.4 - 76.8 %   LYMPH% 20.3 14.0 - 49.7 %   MONO% 6.7 0.0 - 14.0 %   EOS% 1.6 0.0 - 7.0 %   BASO% 0.7 0.0 - 2.0 %  Comprehensive metabolic panel  Result Value Ref Range   Sodium 144 136 - 145 mEq/L   Potassium 4.5 3.5 - 5.1 mEq/L   Chloride 106 98 - 109 mEq/L   CO2 30 (H) 22 - 29 mEq/L   Glucose 238 (H) 70 - 140 mg/dl   BUN 13.5 7.0 - 26.0 mg/dL   Creatinine 0.8 0.6 - 1.1 mg/dL   Total Bilirubin 0.81 0.20 - 1.20 mg/dL   Alkaline Phosphatase 62 40 - 150 U/L   AST 21 5 - 34 U/L   ALT 15 0 - 55 U/L   Total Protein 6.5 6.4 - 8.3 g/dL   Albumin 3.3 (L) 3.5 - 5.0 g/dL   Calcium 8.8 8.4 - 10.4 mg/dL   Anion Gap 8 3 - 11 mEq/L   EGFR 76 (L) >90 ml/min/1.73 m2   CA 2729 available after visit 131, this having been 78 on 03-23-15 and 56 on 12-09-14  Studies/Results:  CT CAP/ PET 11-2014 MRI L spine 01-2015: extensive disc disease, no metastatic disease  Medications: I have reviewed the patient's current medications. Patient requests prescription for norco usually done by Dr [name], which she uses ~ 1x daily for pain mostly in foot. Next apt Dr Tamala Julian is in Feb. Script written for #30, should be filled from here by Dr Tamala Julian. Tamoxifen refilled for 90 days with 2 RF.  DISCUSSION Patient aware that marker may take 2-3 months to show response with change in hormone blocker, however very long response on Femara suggests that she may do well again on a different agent. She is glad to continue tamoxifen, pleased with minimal side effects thus far.    Assessment/Plan:  1.Bilateral breast cancers 1997 treated with bilateral mastectomies and axillary node dissections, adjuvant chemotherapy and radiation. 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 On Letrozole since ~ 04-2010. No clear progression by CT PET 11-2014 done with slight increase in  CA 2729, changed to tamoxifen 04-05-15 due to further rise in marker, asymptomatic.Continue tamoxifen, see patient in 2 months with repeat marker, sooner if needed. BRCA reportedly normal. 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 CA 125 stable in normal range. She saw Dr Skeet Latch 11-13-14 and will see gyn onc annually. 3. Melanoma upper back 2013, treated with wide excision. Followed by dermatology. 4.normal bone density 03-2015. Arthralgias in hands better off aromatase inhibitor 5. CT chest report 02-2013 of sclerotic lesion in left humeral head, then 1.1 cm compared with 0.9 cm 2011. Plain xrays not remarkable 09-2013, no worsening of symptoms. No mention of this area on either CT chest or PET 11-2014. 6.morbid obesity, BMI now 40. Discussed fact that intentional weight loss due to  improvement in eating habits and increased exercise would be beneficial. 7.flu vaccine 03-04-15 8.chronic orthopedic problems left foot: to follow up with podiatrist.   All questions answered and patient knows to call if concerns prior to next scheduled visit. Time spent 25 min including >50% counseling and coordination of care.cc Dr Hal Hope Nat Christen, MD   05/07/2015, 1:39 PM

## 2015-05-08 DIAGNOSIS — C569 Malignant neoplasm of unspecified ovary: Secondary | ICD-10-CM | POA: Insufficient documentation

## 2015-05-08 LAB — CANCER ANTIGEN 27.29: CA 27.29: 131 U/mL — ABNORMAL HIGH (ref 0–39)

## 2015-05-08 LAB — CA 125: CA 125: 13 U/mL (ref <35)

## 2015-05-12 ENCOUNTER — Ambulatory Visit (INDEPENDENT_AMBULATORY_CARE_PROVIDER_SITE_OTHER): Payer: Medicare Other | Admitting: Podiatry

## 2015-05-12 ENCOUNTER — Encounter: Payer: Self-pay | Admitting: Podiatry

## 2015-05-12 VITALS — BP 164/90 | HR 79 | Resp 18

## 2015-05-12 DIAGNOSIS — M779 Enthesopathy, unspecified: Secondary | ICD-10-CM

## 2015-05-12 DIAGNOSIS — E119 Type 2 diabetes mellitus without complications: Secondary | ICD-10-CM

## 2015-05-12 DIAGNOSIS — M19072 Primary osteoarthritis, left ankle and foot: Secondary | ICD-10-CM

## 2015-05-14 NOTE — Progress Notes (Signed)
Patient ID: Brittany Porter, female   DOB: 10/26/1945, 69 y.o.   MRN: [id number]  Subjective: 69 year old female presents to the office today for follow-up evaluation of left foot pain which has been ongoing for several months. She states that she has some good days and bad days. She states over the area of the previous steroid injection last appointment the pain is subsided. She has majority of pain to the medial aspect of the midfoot for which she points to. She is unable to get the orthotics yet. She occasionally gets some stinging pain to the top of her foot as well but it has been ongoing for several months and is not new.  Objective: AAO 3, NAD DP/PT pulses 2/4, CRT less than 3 seconds Protective sensation intact with Semmes Weinstein monofilament, Achilles tendon reflex intact. Scar is seen overlying the dorsal aspect of the left midfoot from recent surgery which is well-healed. The tenderness overlying the dorsal aspect of the midfoot has subsided although the tenderness appears to be localized over the medial aspect of the midfoot at approximately the metatarsal cuneiform joint. There is no specific area pinpoint bony tenderness there is no pain vibratory sensation. There is no amount edema, erythema, increase in warmth. No other areas of tenderness to bilateral lower extremities. MMT 5/5, ROM WNL.  No open lesions or pre-ulcerative lesions identified bilaterally. There is no pain with calf compression, swelling, warmth, erythema.  Assessment: 69 year old female left foot pain, as a result of osteoarthritis.  Plan: -Treatment options discussed including all alternatives, risks, and complications -I discussed with her another steroid injection over the area of maximal tenderness on the medial midfoot. Discussed risks, complications for which he understands he verbally consents. Under sterile conditions a total 1.5 mL of Kenalog and Marcaine plain was infiltrated into the areas marked mild  tenderness with ankle, complications. Post injection care was discussed. -Plantar fascial taping was applied as this seemed to help previously. -Supportive shoe gear. -Recommended orthotics. -Follow-up after inserts or sooner if any problems arise. In the meantime, encouraged to call the office with any questions, concerns, change in symptoms.   Celesta Gentile, DPM

## 2015-05-27 ENCOUNTER — Telehealth: Payer: Self-pay | Admitting: *Deleted

## 2015-05-27 NOTE — Telephone Encounter (Addendum)
[facility] agent called states pt's dx code needs to be changed to a diabetic type code.  05/28/2015 - Spoke with [facility] and gave her the diabetic code E11.9, and she requested it be faxed on the [facility] rx.  Faxed to [facility].

## 2015-05-27 NOTE — Telephone Encounter (Signed)
Please add E11.9. I added it to the chart if you can call them. Thanks.

## 2015-06-24 ENCOUNTER — Other Ambulatory Visit: Payer: Self-pay | Admitting: Oncology

## 2015-06-24 DIAGNOSIS — C50911 Malignant neoplasm of unspecified site of right female breast: Secondary | ICD-10-CM

## 2015-06-24 DIAGNOSIS — C782 Secondary malignant neoplasm of pleura: Principal | ICD-10-CM

## 2015-06-24 DIAGNOSIS — C50912 Malignant neoplasm of unspecified site of left female breast: Secondary | ICD-10-CM

## 2015-06-25 ENCOUNTER — Telehealth: Payer: Self-pay | Admitting: Oncology

## 2015-06-25 ENCOUNTER — Encounter: Payer: Self-pay | Admitting: Oncology

## 2015-06-25 ENCOUNTER — Ambulatory Visit (HOSPITAL_BASED_OUTPATIENT_CLINIC_OR_DEPARTMENT_OTHER): Payer: Medicare Other | Admitting: Oncology

## 2015-06-25 ENCOUNTER — Other Ambulatory Visit (HOSPITAL_BASED_OUTPATIENT_CLINIC_OR_DEPARTMENT_OTHER): Payer: Medicare Other

## 2015-06-25 VITALS — BP 162/87 | HR 82 | Temp 98.5°F | Resp 18 | Ht 63.0 in | Wt 227.1 lb

## 2015-06-25 DIAGNOSIS — C55 Malignant neoplasm of uterus, part unspecified: Secondary | ICD-10-CM

## 2015-06-25 DIAGNOSIS — C782 Secondary malignant neoplasm of pleura: Secondary | ICD-10-CM | POA: Diagnosis not present

## 2015-06-25 DIAGNOSIS — C561 Malignant neoplasm of right ovary: Secondary | ICD-10-CM

## 2015-06-25 DIAGNOSIS — C50912 Malignant neoplasm of unspecified site of left female breast: Secondary | ICD-10-CM

## 2015-06-25 DIAGNOSIS — Z8541 Personal history of malignant neoplasm of cervix uteri: Secondary | ICD-10-CM

## 2015-06-25 DIAGNOSIS — Z8543 Personal history of malignant neoplasm of ovary: Secondary | ICD-10-CM | POA: Diagnosis not present

## 2015-06-25 DIAGNOSIS — M5136 Other intervertebral disc degeneration, lumbar region: Secondary | ICD-10-CM

## 2015-06-25 DIAGNOSIS — C50911 Malignant neoplasm of unspecified site of right female breast: Secondary | ICD-10-CM | POA: Diagnosis not present

## 2015-06-25 LAB — COMPREHENSIVE METABOLIC PANEL
ALT: 16 U/L (ref 0–55)
AST: 23 U/L (ref 5–34)
Albumin: 3.3 g/dL — ABNORMAL LOW (ref 3.5–5.0)
Alkaline Phosphatase: 49 U/L (ref 40–150)
Anion Gap: 12 mEq/L — ABNORMAL HIGH (ref 3–11)
BUN: 12.8 mg/dL (ref 7.0–26.0)
CO2: 27 mEq/L (ref 22–29)
Calcium: 8.6 mg/dL (ref 8.4–10.4)
Chloride: 104 mEq/L (ref 98–109)
Creatinine: 0.8 mg/dL (ref 0.6–1.1)
EGFR: 74 mL/min/{1.73_m2} — ABNORMAL LOW (ref >90)
Glucose: 190 mg/dl — ABNORMAL HIGH (ref 70–140)
Potassium: 4.1 mEq/L (ref 3.5–5.1)
Sodium: 143 mEq/L (ref 136–145)
Total Bilirubin: 0.68 mg/dL (ref 0.20–1.20)
Total Protein: 6.6 g/dL (ref 6.4–8.3)

## 2015-06-25 LAB — CBC WITH DIFFERENTIAL/PLATELET
BASO%: 0.1 % (ref 0.0–2.0)
Basophils Absolute: 0 10*3/uL (ref 0.0–0.1)
EOS%: 0.7 % (ref 0.0–7.0)
Eosinophils Absolute: 0.1 10*3/uL (ref 0.0–0.5)
HCT: 40.5 % (ref 34.8–46.6)
HGB: 13.3 g/dL (ref 11.6–15.9)
LYMPH%: 22.4 % (ref 14.0–49.7)
MCH: 29.8 pg (ref 25.1–34.0)
MCHC: 32.8 g/dL (ref 31.5–36.0)
MCV: 90.8 fL (ref 79.5–101.0)
MONO#: 0.4 10*3/uL (ref 0.1–0.9)
MONO%: 6.5 % (ref 0.0–14.0)
NEUT#: 4.7 10*3/uL (ref 1.5–6.5)
NEUT%: 70.3 % (ref 38.4–76.8)
Platelets: 148 10*3/uL (ref 145–400)
RBC: 4.46 10*6/uL (ref 3.70–5.45)
RDW: 13.4 % (ref 11.2–14.5)
WBC: 6.7 10*3/uL (ref 3.9–10.3)
lymph#: 1.5 10*3/uL (ref 0.9–3.3)

## 2015-06-25 NOTE — Progress Notes (Signed)
OFFICE PROGRESS NOTE   June 27, 2015   Physicians: Marcy Panning), [name], [name], [place], Candace [place], (James Kinard), [facility], [place].Dahldorf/ Mina Marble of [facility]  INTERVAL HISTORY:  Patient is seen, alone for visit, in continuing attention to breast cancer which has been metastatic to pleura since 04-2010, with recent progression by CA 2729 marker such that previous Femara was changed to Tamoxifen beginning 04-05-15. She is tolerating tamoxifen well, with less arthralgia symptoms in hands than when she was on Femara.  Patient's husband, son and grandson who all live with her are all sick at home with fever and respiratory symptoms; she does not feel ill.  Other than improvement in hands off of Femara, she reports generalized arthritis very limiting. Dr Tamala Julian has just started lyrica this week, cannot tell improvement yet, will see her for PE on 07-08-15. She has chronic bladder urgency, has never tried medication or seen urology, limits activity at times; after discussing, she wants to get Dr Thompson Caul recommendations at upcoming visit. She denies increased SOB, cough, chest pain. She denies abdominal or pelvic discomfort, swelling LE, any bleeding. Appetite good. Energy at baseline. Remainder of 10 point Review of Systems negative/ unchanged.    No PAC Patient confirms that she had genetics testing 2010, negative BRCA. 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  ONCOLOGIC HISTORY Bilateral breast cancer treated with bilateral mastectomies and bilateral axillary node dissections by Dr Andrey Campanile in 1997; she recalls that all nodes were negative. She had chemotherapy by Dr Tressie Stalker and bilateral radiation by Dr Danny Lawless. 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 She was begun on letrozole at that time.Note CA 2729 was elevated at time of recurrent disease. She continued letrozole unitl rising CA 2729 in 03-2015, with hormonal blocker changed to tamoxifen 04-05-15.   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 She received 6 cycles adjuvant taxol carboplatin thru Nov 2010 and vaginal cuff brachytherapy completed Jan 2011. Note CA125 was elevated at presentation.  Melanoma upper mid back treated with wide local excision 02-2012 ( that path not located in EMR   Objective:  Vital signs in last 24 hours:  BP 162/87 mmHg  Pulse 82  Temp(Src) 98.5 F (36.9 C)  Resp 18  Ht '5\' 3"'  (1.6 m)  Wt 227 lb 1.6 oz (103.012 kg)  BMI 40.24 kg/m2  SpO2 99% Weight up 2 lbs. Alert, oriented and appropriate. Ambulatory slowly, able to get on and off exam table with assistance of 1.   HEENT:PERRL, sclerae not icteric. Oral mucosa moist without lesions, posterior pharynx clear.  Neck supple. No JVD.  Lymphatics:no cervical,supraclavicular, axillary or inguinal adenopathy Resp: clear to auscultation bilaterally and normal percussion bilaterally Cardio: regular rate and rhythm. No gallop. GI: abdomen obese, soft, nontender, no appreciable mass or organomegaly. Normally active bowel sounds. Surgical incision not remarkable. Musculoskeletal/ Extremities: UE/ LE without pitting edema, cords, tenderness. Spine not tender to palpation. Grip good now in hands bilaterally. Neuro: nonfocal   PSYCH appropriate mood and affect Skin without rash, ecchymosis, petechiae Breasts: bilateral mastectomy scars without evidence of local recurrence. Axillae benign.   Lab Results:  Results for orders placed or performed in visit on 06/25/15  CBC with Differential  Result Value Ref Range   WBC 6.7 3.9 - 10.3 10e3/uL   NEUT# 4.7 1.5 - 6.5 10e3/uL   HGB 13.3 11.6 - 15.9 g/dL   HCT  40.5 34.8 - 46.6 %   Platelets 148 145 - 400 10e3/uL   MCV 90.8 79.5 - 101.0 fL   MCH 29.8 25.1 - 34.0 pg   MCHC 32.8 31.5 - 36.0 g/dL   RBC 4.46 3.70 - 5.45 10e6/uL   RDW 13.4 11.2 - 14.5 %   lymph# 1.5 0.9 - 3.3 10e3/uL   MONO# 0.4 0.1 - 0.9 10e3/uL   Eosinophils Absolute 0.1 0.0 - 0.5 10e3/uL   Basophils Absolute 0.0 0.0 - 0.1 10e3/uL   NEUT% 70.3 38.4 - 76.8 %   LYMPH% 22.4 14.0 - 49.7 %   MONO% 6.5 0.0 - 14.0 %   EOS% 0.7 0.0 - 7.0 %   BASO% 0.1 0.0 - 2.0 %  Comprehensive metabolic panel  Result Value Ref Range   Sodium 143 136 - 145 mEq/L   Potassium 4.1 3.5 - 5.1 mEq/L   Chloride 104 98 - 109 mEq/L   CO2 27 22 - 29 mEq/L   Glucose 190 (H) 70 - 140 mg/dl   BUN 12.8 7.0 - 26.0 mg/dL   Creatinine 0.8 0.6 - 1.1 mg/dL   Total Bilirubin 0.68 0.20 - 1.20 mg/dL   Alkaline Phosphatase 49 40 - 150 U/L   AST 23 5 - 34 U/L   ALT 16 0 - 55 U/L   Total Protein 6.6 6.4 - 8.3 g/dL   Albumin 3.3 (L) 3.5 - 5.0 g/dL   Calcium 8.6 8.4 - 10.4 mg/dL   Anion Gap 12 (H) 3 - 11 mEq/L   EGFR 74 (L) >90 ml/min/1.73 m2  Cancer antigen 27.29  Result Value Ref Range   CA 27.29 143.4 (H) 0.0 - 38.6 U/mL  CA 27-29 (Parallel Testing)  Result Value Ref Range   CA 27.29 156 (H) <38 U/mL   CA 2729 by "parallel testing" lab method had been 56 oon 11-19-14, 78 on 03-23-15 and 131 on 05-07-15. Present rise is less than increase in 6 week span from Nov to Dec.   Studies/Results: CT PET 11-2014  MRI L spine 01-2015 no concern for metastatic disease, multilevel disc disease. Bone density scan 03-2015 normal tho lower than 2014 (did not use LS due to "advanced degenerative disease")  Medications: I have reviewed the patient's current medications.   DISCUSSION  Clinically stable,  arthritis complaints noted. CA 2729 available after visit seems to be leveling off tho not improved yet, not quite 3 months on tamoxifen. Will let patient know results and will suggest repeat in 4-6 weeks. If cannot tell clear  improvement then, consider repeat PET.  Note could use another aromatase inhibitor by bone density, tho arthralgias in hands improved off of this now; could use Faslodex.   Assessment/Plan:  1.Bilateral breast cancers 1997 treated with bilateral mastectomies and axillary node dissections, adjuvant chemotherapy and radiation. 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 On Letrozole from 04-2010 until 03-2015 due to increasing marker, tho PET CT a few months prior did not show imaging correlation.. Marker slightly higher to day, tho seems to be leveling off, is clinically stable and is tolerating tamoxifen better than Femara. Plan as above.  BRCA reportedly normal. 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 CA 125 stable in normal range. She saw Dr Skeet Latch 11-13-14 and will see gyn onc annually. 3. Melanoma upper back 2013, treated with wide excision. Followed by dermatology. 4.normal bone density 03-2015. Arthralgias in hands better off aromatase inhibitor 5. CT chest report 02-2013 of sclerotic lesion in left humeral head, then 1.1 cm compared with 0.9 cm 2011. Plain xrays not remarkable 09-2013, no worsening of symptoms. No mention of this area on either CT chest or PET 11-2014. 6.morbid obesity, BMI now 40. Discussed fact that intentional weight loss due to improvement in eating habits and increased exercise would be beneficial. 7.flu vaccine 03-04-15 8.chronic orthopedic problems left foot:  follows with podiatrist.  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 Begun on lyrica this week by PCP 10. Urinary urgency without dysuria: to discuss with PCP. Has not seen urology and has not tried any medications for this  All questions answered. Time spent 25 min including >50%  counseling and coordination of care. Cc Dr Hal Hope Nat Christen, MD   06/27/2015, 4:39 PM

## 2015-06-25 NOTE — Telephone Encounter (Signed)
Gave patient avs report and appointments for May  °

## 2015-06-26 LAB — CANCER ANTIGEN 27-29 (PARALLEL TESTING): CA 27.29: 156 U/mL — ABNORMAL HIGH (ref <38)

## 2015-06-26 LAB — CANCER ANTIGEN 27.29: CA 27.29: 143.4 U/mL — ABNORMAL HIGH (ref 0.0–38.6)

## 2015-06-27 DIAGNOSIS — M5136 Other intervertebral disc degeneration, lumbar region: Secondary | ICD-10-CM | POA: Insufficient documentation

## 2015-06-30 ENCOUNTER — Telehealth: Payer: Self-pay

## 2015-06-30 NOTE — Telephone Encounter (Signed)
Told  Ms. Reddington the results of the CA-125 as noted below.  She will have lab and see Dr.Livesay on 07-30-15 so she only makes 1 co-pay.

## 2015-06-30 NOTE — Telephone Encounter (Signed)
L/M for Ms. Medero to call back regarding lab work results from 06-25-15 visit.

## 2015-06-30 NOTE — Telephone Encounter (Signed)
-----   Message from Gordy Levan, MD sent at 06/27/2015  5:04 PM EST ----- Labs seen and need follow up: please let her know the CA 2729 marker has gone up a little, but not as much of an increase as we had seen previously - sometimes we can see the marker start to level out before it drops. Since she is tolerating the tamoxifen well and seems stable otherwise, I suggest we repeat labs in 4-6 weeks. I can see her back either same day as lab, or ~ a week after she gets lab done so that we have result at time of visit. I have put in lab orders but not POF until RN can talk to her and see what she prefers   Thank you

## 2015-07-07 ENCOUNTER — Ambulatory Visit: Payer: Medicare Other | Admitting: Podiatry

## 2015-07-08 ENCOUNTER — Telehealth: Payer: Self-pay | Admitting: *Deleted

## 2015-07-23 ENCOUNTER — Encounter: Payer: Self-pay | Admitting: Podiatry

## 2015-07-23 ENCOUNTER — Ambulatory Visit (INDEPENDENT_AMBULATORY_CARE_PROVIDER_SITE_OTHER): Payer: Medicare Other | Admitting: Podiatry

## 2015-07-23 VITALS — BP 138/79 | HR 105 | Resp 18

## 2015-07-23 DIAGNOSIS — M779 Enthesopathy, unspecified: Secondary | ICD-10-CM

## 2015-07-23 DIAGNOSIS — M19072 Primary osteoarthritis, left ankle and foot: Secondary | ICD-10-CM

## 2015-07-23 NOTE — Progress Notes (Signed)
Patient ID: Brittany Porter, female   DOB: [birth year], 70 y.o.   MRN: [id number]  Subjective: 70 year old female presents to the office today for follow-up evaluation of left foot pain. She states the injection that she last appointment helped quite a bit up consult last week or so. She also just got her new shoes and inserts yesterday. Denies any increase in swelling or redness. No tingling or numbness. No other complaints.  He states his pain has been ongoing for quite some time. She had recent surgery to remove the exostosis off the midfoot before I started seeing [facility] she states the pain that she was having now she was having prior to the surgery as well.  Objective: AAO 3, NAD DP/PT pulses 2/4, CRT less than 3 seconds Protective sensation intact with Semmes Weinstein monofilament Scar is seen overlying the dorsal aspect of the left midfoot from recent surgery which is well-healed. There is tenderness overlying the dorsal aspect of the midfoot mostly along the dorsal medial aspect. No specific area pinpoint bony tenderness is no pain the vibratory sensation. There is no overlying edema, erythema, increase in warmth. No other areas of tenderness to bilateral lower extremities. No open lesions or pre-ulcerative lesions identified bilaterally. There is no pain with calf compression, swelling, warmth, erythema.  Assessment: 70 year old female left foot pain, as a result of osteoarthritis.  Plan: -Treatment options discussed including all alternatives, risks, and complications -I discussed with her another steroid injection over the area of maximal tenderness on the medial midfoot. Discussed risks, complications for which he understands he verbally consents. Under sterile conditions a total 1.5 mL of Kenalog and Marcaine plain was infiltrated into the areas marked mild tenderness with ankle, complications. Post injection care was discussed. -Continue inserts and shoes which she just  yesterday. -Order compound cream, anti-inflammatory. -Follow-up in 6 weeks if symptoms continue or sooner if any problems arise. In the meantime, encouraged to call the office with any questions, concerns, change in symptoms.   Celesta Gentile, DPM

## 2015-07-26 ENCOUNTER — Other Ambulatory Visit: Payer: Self-pay | Admitting: Oncology

## 2015-07-29 ENCOUNTER — Telehealth: Payer: Self-pay | Admitting: Oncology

## 2015-07-29 NOTE — Telephone Encounter (Signed)
Labs added per 03/15 POF, pt is aware.Brittany Porter

## 2015-07-29 NOTE — Progress Notes (Unsigned)
Patient called to reschedule lab appt from 08/10/15 to 07/30/15.  Will see Dr. Marko Plume on 08/10/15 for results.

## 2015-07-30 ENCOUNTER — Other Ambulatory Visit: Payer: Medicare Other

## 2015-07-30 ENCOUNTER — Other Ambulatory Visit (HOSPITAL_BASED_OUTPATIENT_CLINIC_OR_DEPARTMENT_OTHER): Payer: Medicare Other

## 2015-07-30 ENCOUNTER — Ambulatory Visit: Payer: Medicare Other | Admitting: Oncology

## 2015-07-30 DIAGNOSIS — C782 Secondary malignant neoplasm of pleura: Secondary | ICD-10-CM

## 2015-07-30 DIAGNOSIS — C50912 Malignant neoplasm of unspecified site of left female breast: Secondary | ICD-10-CM

## 2015-07-30 LAB — COMPREHENSIVE METABOLIC PANEL
ALT: 22 U/L (ref 0–55)
AST: 26 U/L (ref 5–34)
Albumin: 3.5 g/dL (ref 3.5–5.0)
Alkaline Phosphatase: 48 U/L (ref 40–150)
Anion Gap: 11 mEq/L (ref 3–11)
BUN: 15.3 mg/dL (ref 7.0–26.0)
CO2: 30 mEq/L — ABNORMAL HIGH (ref 22–29)
Calcium: 8.6 mg/dL (ref 8.4–10.4)
Chloride: 103 mEq/L (ref 98–109)
Creatinine: 0.9 mg/dL (ref 0.6–1.1)
EGFR: 68 mL/min/{1.73_m2} — ABNORMAL LOW (ref >90)
Glucose: 220 mg/dl — ABNORMAL HIGH (ref 70–140)
Potassium: 4 mEq/L (ref 3.5–5.1)
Sodium: 144 mEq/L (ref 136–145)
Total Bilirubin: 0.88 mg/dL (ref 0.20–1.20)
Total Protein: 7 g/dL (ref 6.4–8.3)

## 2015-07-30 LAB — CBC WITH DIFFERENTIAL/PLATELET
BASO%: 0.9 % (ref 0.0–2.0)
Basophils Absolute: 0.1 10*3/uL (ref 0.0–0.1)
EOS%: 0.5 % (ref 0.0–7.0)
Eosinophils Absolute: 0 10*3/uL (ref 0.0–0.5)
HCT: 43.9 % (ref 34.8–46.6)
HGB: 14.2 g/dL (ref 11.6–15.9)
LYMPH%: 15.1 % (ref 14.0–49.7)
MCH: 28.9 pg (ref 25.1–34.0)
MCHC: 32.3 g/dL (ref 31.5–36.0)
MCV: 89.4 fL (ref 79.5–101.0)
MONO#: 0.5 10*3/uL (ref 0.1–0.9)
MONO%: 5.6 % (ref 0.0–14.0)
NEUT#: 6.8 10*3/uL — ABNORMAL HIGH (ref 1.5–6.5)
NEUT%: 77.9 % — ABNORMAL HIGH (ref 38.4–76.8)
Platelets: 179 10*3/uL (ref 145–400)
RBC: 4.91 10*6/uL (ref 3.70–5.45)
RDW: 13.6 % (ref 11.2–14.5)
WBC: 8.7 10*3/uL (ref 3.9–10.3)
lymph#: 1.3 10*3/uL (ref 0.9–3.3)

## 2015-07-31 ENCOUNTER — Other Ambulatory Visit: Payer: Self-pay | Admitting: Oncology

## 2015-07-31 LAB — CANCER ANTIGEN 27.29: CA 27.29: 163.8 U/mL — ABNORMAL HIGH (ref 0.0–38.6)

## 2015-07-31 LAB — CANCER ANTIGEN 27-29 (PARALLEL TESTING): CA 27.29: 168 U/mL — ABNORMAL HIGH (ref <38)

## 2015-08-05 ENCOUNTER — Telehealth: Payer: Self-pay

## 2015-08-05 NOTE — Telephone Encounter (Signed)
Pt called stating she will not be able to make 1030 appt on 3/27. She is asking if she can have later appt on 3/27 or just talk to Dr Edwyna Shell on phone about her lab results. Labs were drawn 3/16. Only available appt is for new pt so will have to ask Dr Edwyna Shell.

## 2015-08-07 ENCOUNTER — Other Ambulatory Visit: Payer: Self-pay | Admitting: Oncology

## 2015-08-07 ENCOUNTER — Telehealth: Payer: Self-pay | Admitting: Oncology

## 2015-08-07 DIAGNOSIS — C782 Secondary malignant neoplasm of pleura: Principal | ICD-10-CM

## 2015-08-07 DIAGNOSIS — C50919 Malignant neoplasm of unspecified site of unspecified female breast: Secondary | ICD-10-CM

## 2015-08-07 NOTE — Telephone Encounter (Signed)
Spoke with patient to confirm appt in April

## 2015-08-07 NOTE — Telephone Encounter (Signed)
S/w pt about Dr Edwyna Shell attached message. CT and OV are already scheduled.

## 2015-08-07 NOTE — Telephone Encounter (Signed)
-----   Message from Gordy Levan, MD sent at 08/07/2015  2:46 PM EDT ----- Juliann Pulse -  Could you please let her know that will be ok to move next apt out to another day. Let her know that the marker was up just a little more on most recent labs. I will try to get a PET scan before I see her, if her insurance will allow. OK to continue tamoxifen for now if she still has tablets, but do not refill now Please find out day and time that would suit her better, in ~ 2.5 - 3 weeks. I have put in POF for PET/ cancel 3-27/ reschedule , so please let scheduler know if she has preference  thanks

## 2015-08-10 ENCOUNTER — Ambulatory Visit: Payer: Medicare Other | Admitting: Oncology

## 2015-08-10 ENCOUNTER — Other Ambulatory Visit: Payer: Medicare Other

## 2015-08-24 ENCOUNTER — Other Ambulatory Visit: Payer: Self-pay

## 2015-08-24 DIAGNOSIS — C569 Malignant neoplasm of unspecified ovary: Secondary | ICD-10-CM

## 2015-08-24 DIAGNOSIS — C50919 Malignant neoplasm of unspecified site of unspecified female breast: Secondary | ICD-10-CM

## 2015-08-24 DIAGNOSIS — C55 Malignant neoplasm of uterus, part unspecified: Secondary | ICD-10-CM

## 2015-08-24 DIAGNOSIS — C782 Secondary malignant neoplasm of pleura: Secondary | ICD-10-CM

## 2015-08-25 ENCOUNTER — Other Ambulatory Visit (HOSPITAL_BASED_OUTPATIENT_CLINIC_OR_DEPARTMENT_OTHER): Payer: Medicare Other

## 2015-08-25 ENCOUNTER — Encounter (HOSPITAL_COMMUNITY): Payer: Self-pay

## 2015-08-25 ENCOUNTER — Ambulatory Visit (HOSPITAL_COMMUNITY)
Admission: RE | Admit: 2015-08-25 | Discharge: 2015-08-25 | Disposition: A | Discharge status: Home / Self Care | Payer: Medicare Other | Source: Ambulatory Visit | Attending: Oncology | Admitting: Oncology

## 2015-08-25 DIAGNOSIS — R160 Hepatomegaly, not elsewhere classified: Secondary | ICD-10-CM | POA: Diagnosis not present

## 2015-08-25 DIAGNOSIS — C55 Malignant neoplasm of uterus, part unspecified: Secondary | ICD-10-CM

## 2015-08-25 DIAGNOSIS — C782 Secondary malignant neoplasm of pleura: Secondary | ICD-10-CM | POA: Diagnosis present

## 2015-08-25 DIAGNOSIS — C50919 Malignant neoplasm of unspecified site of unspecified female breast: Secondary | ICD-10-CM | POA: Diagnosis not present

## 2015-08-25 DIAGNOSIS — R918 Other nonspecific abnormal finding of lung field: Secondary | ICD-10-CM | POA: Insufficient documentation

## 2015-08-25 DIAGNOSIS — K4020 Bilateral inguinal hernia, without obstruction or gangrene, not specified as recurrent: Secondary | ICD-10-CM | POA: Diagnosis not present

## 2015-08-25 DIAGNOSIS — K760 Fatty (change of) liver, not elsewhere classified: Secondary | ICD-10-CM | POA: Diagnosis not present

## 2015-08-25 DIAGNOSIS — K5730 Diverticulosis of large intestine without perforation or abscess without bleeding: Secondary | ICD-10-CM | POA: Insufficient documentation

## 2015-08-25 DIAGNOSIS — J90 Pleural effusion, not elsewhere classified: Secondary | ICD-10-CM | POA: Insufficient documentation

## 2015-08-25 DIAGNOSIS — I2510 Atherosclerotic heart disease of native coronary artery without angina pectoris: Secondary | ICD-10-CM | POA: Diagnosis not present

## 2015-08-25 DIAGNOSIS — C569 Malignant neoplasm of unspecified ovary: Secondary | ICD-10-CM

## 2015-08-25 LAB — COMPREHENSIVE METABOLIC PANEL
ALT: 19 U/L (ref 0–55)
AST: 28 U/L (ref 5–34)
Albumin: 3.4 g/dL — ABNORMAL LOW (ref 3.5–5.0)
Alkaline Phosphatase: 44 U/L (ref 40–150)
Anion Gap: 10 mEq/L (ref 3–11)
BUN: 9.9 mg/dL (ref 7.0–26.0)
CO2: 29 mEq/L (ref 22–29)
Calcium: 9.3 mg/dL (ref 8.4–10.4)
Chloride: 104 mEq/L (ref 98–109)
Creatinine: 0.8 mg/dL (ref 0.6–1.1)
EGFR: 80 mL/min/{1.73_m2} — ABNORMAL LOW (ref >90)
Glucose: 188 mg/dl — ABNORMAL HIGH (ref 70–140)
Potassium: 5 mEq/L (ref 3.5–5.1)
Sodium: 143 mEq/L (ref 136–145)
Total Bilirubin: 0.78 mg/dL (ref 0.20–1.20)
Total Protein: 6.7 g/dL (ref 6.4–8.3)

## 2015-08-25 MED ORDER — IOPAMIDOL (ISOVUE-300) INJECTION 61%
100.0000 mL | Freq: Once | INTRAVENOUS | Status: AC | PRN
  Administered 2015-08-25: 100 mL via INTRAVENOUS

## 2015-08-26 ENCOUNTER — Other Ambulatory Visit: Payer: Self-pay | Admitting: Oncology

## 2015-08-26 ENCOUNTER — Ambulatory Visit (HOSPITAL_COMMUNITY): Payer: Medicare Other

## 2015-08-26 ENCOUNTER — Other Ambulatory Visit: Payer: Medicare Other

## 2015-08-27 ENCOUNTER — Encounter: Payer: Self-pay | Admitting: Oncology

## 2015-08-27 NOTE — Progress Notes (Signed)
Medical Oncology   Discussed liver lesion with interventional radiologist. Location a little difficult as liver up high, but if able to image on [place] should be able to biopsy. Need to order as US guided biopsy if so.  Godfrey Pick, MD

## 2015-08-31 ENCOUNTER — Other Ambulatory Visit: Payer: Self-pay

## 2015-08-31 ENCOUNTER — Encounter: Payer: Self-pay | Admitting: Oncology

## 2015-08-31 ENCOUNTER — Telehealth: Payer: Self-pay | Admitting: Oncology

## 2015-08-31 ENCOUNTER — Ambulatory Visit (HOSPITAL_BASED_OUTPATIENT_CLINIC_OR_DEPARTMENT_OTHER): Payer: Medicare Other | Admitting: Oncology

## 2015-08-31 VITALS — BP 139/55 | HR 87 | Temp 98.2°F | Resp 18 | Ht 63.0 in | Wt 229.8 lb

## 2015-08-31 DIAGNOSIS — C50911 Malignant neoplasm of unspecified site of right female breast: Secondary | ICD-10-CM | POA: Diagnosis not present

## 2015-08-31 DIAGNOSIS — K7689 Other specified diseases of liver: Secondary | ICD-10-CM

## 2015-08-31 DIAGNOSIS — C782 Secondary malignant neoplasm of pleura: Secondary | ICD-10-CM

## 2015-08-31 DIAGNOSIS — C50912 Malignant neoplasm of unspecified site of left female breast: Secondary | ICD-10-CM | POA: Diagnosis not present

## 2015-08-31 DIAGNOSIS — C55 Malignant neoplasm of uterus, part unspecified: Secondary | ICD-10-CM

## 2015-08-31 DIAGNOSIS — C561 Malignant neoplasm of right ovary: Secondary | ICD-10-CM

## 2015-08-31 DIAGNOSIS — C4359 Malignant melanoma of other part of trunk: Secondary | ICD-10-CM

## 2015-08-31 DIAGNOSIS — J40 Bronchitis, not specified as acute or chronic: Secondary | ICD-10-CM

## 2015-08-31 DIAGNOSIS — K769 Liver disease, unspecified: Secondary | ICD-10-CM

## 2015-08-31 DIAGNOSIS — M19079 Primary osteoarthritis, unspecified ankle and foot: Secondary | ICD-10-CM

## 2015-08-31 MED ORDER — HYDROCODONE-ACETAMINOPHEN 5-325 MG PO TABS: 1.0000 | ORAL_TABLET | Freq: Two times a day (BID) | ORAL | Status: DC | PRN

## 2015-08-31 NOTE — Telephone Encounter (Signed)
appt made and avs printed. Biopsy to be sch by central radiology

## 2015-08-31 NOTE — Telephone Encounter (Signed)
Entered in error

## 2015-08-31 NOTE — Progress Notes (Signed)
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 CT CAP 08-25-15 shows a new solitary liver lesion. In addition to the bilateral breast cancers, oncologic history is remarkable for endometrial and ovarian cancers and melanoma.   She has been off tamoxifen x 2 weeks for reevaluation of slight further increase in marker and new solitary liver lesion.  She has been ill with bronchitis x 3 weeks, did not improve with z pack and tessalon, still residual cough productive of minimal clear secretions, no SOB, no fever, some intial upper respiratory congestion. All family members with same respiratory symptoms. Dr Tamala Julian has been treating this problem. She was feeling at baseline prior to the bronchitis, with arthralgia symptoms better off of letrozole, no previous respiratory symptoms, going about usual activities, no abdominal or pelvic discomfort, no bleeding. She cannot tell any difference in any symptoms since holding the tamoxifen. Remainder of 10 point Review of Systems negative.  No PAC Patient confirms that she had genetics testing 2010, negative BRCA. CA 2729 was 71 in 04-2010 Flu vaccine 03-04-15  ONCOLOGIC HISTORY Bilateral breast cancer treated with bilateral mastectomies and bilateral axillary node dissections by Dr Andrey Campanile in 1997; she recalls that all nodes were negative. She had chemotherapy by Dr Tressie Stalker and bilateral radiation by Dr Danny Lawless. 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 She was begun on  letrozole at that time.Note CA 2729 was elevated at time of recurrent disease. She continued letrozole unitl rising CA 2729 in 03-2015, with hormonal blocker changed to tamoxifen 04-05-15.   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 She received 6 cycles adjuvant taxol carboplatin thru Nov 2010 and vaginal cuff brachytherapy completed Jan 2011. Note CA125 was elevated at presentation.  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 She looks mildly fatigued and sounds slightly hoarse, but does not appear acutely ill. Alert, oriented and appropriate. Ambulatory without assistance, able to get on and off exam table with effort  No alopecia  HEENT:PERRL, sclerae not icteric. Oral mucosa moist without lesions, posterior pharynx clear.  Neck supple. No JVD.  Lymphatics:no cervical,supraclavicular, axillary or inguinal adenopathy Resp: no wheezes or rales bilaterally and normal percussion bilaterally Cardio: regular rate and rhythm. No gallop. GI: soft, nontender, not distended, no mass or organomegaly. Normally active bowel sounds. Surgical incisions not remarkable. Musculoskeletal/ Extremities: without pitting edema, cords, tenderness Neuro: no peripheral neuropathy. Otherwise nonfocal. PSYCH appropriate mood and affect Skin well healed large area of surgical scars upper mid back from melanoma surgery, otherwise without rash, ecchymosis, petechiae Breasts: bilateral mastectomy scars without evidence of local recurrence, telangiectasias. Axillae benign.   Lab Results:  Results for  orders placed or performed in visit on 08/25/15  Comprehensive  metabolic panel  Result Value Ref Range   Sodium 143 136 - 145 mEq/L   Potassium 5.0 3.5 - 5.1 mEq/L   Chloride 104 98 - 109 mEq/L   CO2 29 22 - 29 mEq/L   Glucose 188 (H) 70 - 140 mg/dl   BUN 9.9 7.0 - 26.0 mg/dL   Creatinine 0.8 0.6 - 1.1 mg/dL   Total Bilirubin 0.78 0.20 - 1.20 mg/dL   Alkaline Phosphatase 44 40 - 150 U/L   AST 28 5 - 34 U/L   ALT 19 0 - 55 U/L   Total Protein 6.7 6.4 - 8.3 g/dL   Albumin 3.4 (L) 3.5 - 5.0 g/dL   Calcium 9.3 8.4 - 10.4 mg/dL   Anion Gap 10 3 - 11 mEq/L   EGFR 80 (L) >90 ml/min/1.73 m2    CA 2729 on 07-30-15 was 163, this having ben 143 by same lab method on 06-25-15 (and by previous lab method 168 on 07-30-15 and 156 on 06-25-15)  Studies/Results: EXAM: CT CHEST, ABDOMEN, AND PELVIS WITH CONTRAST  COMPARISON: 12/03/2014 CT chest, abdomen and pelvis and PET-CT.  FINDINGS: CT CHEST  Mediastinum/Nodes: Mild cardiomegaly. No pericardial fluid/thickening. Coronary atherosclerosis. Great vessels are normal in course and caliber. No central pulmonary emboli. Normal visualized thyroid. Normal esophagus. Bilateral axillary surgical clips. No axillary adenopathy.  Lungs/Pleura: No pneumothorax. No right pleural effusion. Pleural thickening, curvilinear pleural calcification and small basilar pleural effusion in the left pleural space is not appreciably changed, with no discrete left pleural mass. Minimal layering secretions in the tracheal lumen. Medial right upper lobe 3 mm solid pulmonary nodule (series 5/image 19) is stable since 03/30/2010 and benign. Extensive patchy bandlike consolidation at the left lung base appears stable in the lingula and increased in the left lower lobe, probably representing a combination of scarring and rounded atelectasis. No acute consolidative airspace disease or new significant pulmonary nodules.  Musculoskeletal: Stable nonspecific sclerotic lesion in the left humeral head. No new bone lesions in the  chest. Mild degenerative changes in the thoracic spine. Status post bilateral mastectomy. No mass or fluid collection in the ventral chest wall.  CT ABDOMEN AND PELVIS  Hepatobiliary: Diffuse hepatic steatosis. New hyperdense 2.1 x 1.6 cm segment 7 right liver lobe mass (series 2/image 41). No additional liver masses. Normal gallbladder with no radiopaque cholelithiasis. No biliary ductal dilatation.  Pancreas: Normal, with no mass or duct dilation.  Spleen: Normal size. No mass. 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  Adrenals/Urinary Tract: Normal adrenals. No hydronephrosis. Two hypodense subcentimeter renal cortical lesions in the upper right kidney are too small to characterize and appear stable, probably benign renal cysts. Otherwise normal kidneys, with no hydronephrosis. Normal bladder.  Stomach/Bowel: Grossly normal stomach. Normal caliber small bowel with no small bowel wall thickening. Appendix not discretely visualized. Mild diffuse colonic diverticulosis, with no large bowel wall thickening or pericolonic fat stranding. Oral contrast progresses to the distal rectum.  Vascular/Lymphatic: Atherosclerotic nonaneurysmal abdominal aorta. Patent portal, splenic, hepatic and renal veins. No pathologically enlarged lymph nodes in the abdomen or pelvis.  Reproductive: Status post hysterectomy, with no abnormal findings at the vaginal cuff. No adnexal mass.  Other: No pneumoperitoneum, ascites or focal fluid collection. Small bilateral fat containing inguinal hernias, unchanged. No evidence of recurrent ventral abdominal wall hernia status post hernia repair.  Musculoskeletal: No aggressive appearing focal osseous lesions. Subcentimeter  sclerotic lesion in the left femoral head is not appreciably changed since 2010 and probably a benign bone island. Marked degenerative changes in the lumbar  spine.  IMPRESSION: 1. Solitary new 2.1 cm segment 7 right liver lobe mass, suspicious for liver metastasis. 2. 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 Extensive patchy bandlike consolidation at the left lung base, mildly increased, favor a combination of scarring and rounded atelectasis. 3. Additional findings include coronary atherosclerosis, diffuse hepatic steatosis, mild diffuse colonic diverticulosis and small bilateral fat containing inguinal hernias.  PACs images reviewed with patient   Medications: I have reviewed the patient's current medications. Did not fill lyrica by PCP for arthritis due to cost. Did not fill hycodan/ tussionex due to cost Has used hydrocodone prn for arthritis in past, out of that script x ~ 4 wks. Could use this for residual cough, so rewritten one time for #20 now.  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 Prior to visit today, I discussed with interventional radiology: location a little difficult but feel biopsy can be done with [place].  Information reviewed with patient. Breast or melanoma would be more likely to go to liver than the gyn cancers, with different treatment considerations depending on primary. She agrees with biopsy by IR if possible, prefers this be done on a Tues or Fri. She will stay off of tamoxifen until biopsy information if available, as this could interfere with path testing.     Assessment/Plan:  1.Bilateral breast cancers 1997 treated with bilateral mastectomies and axillary node dissections, adjuvant chemotherapy and radiation.  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 On Letrozole from 04-2010 until 03-2015 changed to tamoxifen due to increasing marker, tho PET CT 11-2013 did not show imaging correlation.  BRCA reportedly normal. CT CAP 08-25-15 stable including lungs except new solitary liver lesion. Holding tamoxifen, request biopsy by IR then return visit here.  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 CA 125 stable in normal range. She saw Dr Skeet Latch 11-13-14 and will see gyn onc annually. 3. Melanoma upper back 2013, treated with wide excision. Followed by dermatology. I have not located that pathology in EMR, may be with Dr Ledell Peoples records and will ask [facility] HIM to request. 4.solitary new liver lesion on CT CAP 08-25-2015, otherwise stable. Biopsy if possible as above. 5. Sclerotic lesion in left humeral head stable baxk to 02-2013 6.morbid obesity, BMI 40.8. Weight loss due to improvement in eating habits and increased exercise would be beneficial. 7.flu vaccine 03-04-15 8.recent bronchitis: similar problems thruout community, improving slowly. Doubt cancer related. 9.chronic orthopedic problems left foot: follows with podiatrist. 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 Arthralgias in hands better off aromatase inhibitor     All questions answered. Time spent 35 min including >50% counseling and coordination of care. Route PCP, cc Dr Allyson Sabal as we will request path report from the melanoma if that is in his records - thank you  Gordy Levan, MD  09/06/2015, 11:34 AM

## 2015-09-06 DIAGNOSIS — K769 Liver disease, unspecified: Secondary | ICD-10-CM | POA: Insufficient documentation

## 2015-09-08 ENCOUNTER — Telehealth: Payer: Self-pay | Admitting: Oncology

## 2015-09-08 NOTE — Telephone Encounter (Signed)
Faxed records request to Dr. Jodi Geralds

## 2015-09-10 ENCOUNTER — Other Ambulatory Visit: Payer: Self-pay | Admitting: Radiology

## 2015-09-11 ENCOUNTER — Ambulatory Visit (HOSPITAL_COMMUNITY)
Admission: RE | Admit: 2015-09-11 | Discharge: 2015-09-11 | Disposition: A | Discharge status: Home / Self Care | Payer: Medicare Other | Source: Ambulatory Visit | Attending: Oncology | Admitting: Oncology

## 2015-09-11 ENCOUNTER — Encounter (HOSPITAL_COMMUNITY): Payer: Self-pay

## 2015-09-11 DIAGNOSIS — C782 Secondary malignant neoplasm of pleura: Secondary | ICD-10-CM | POA: Insufficient documentation

## 2015-09-11 DIAGNOSIS — C50912 Malignant neoplasm of unspecified site of left female breast: Secondary | ICD-10-CM

## 2015-09-11 DIAGNOSIS — C787 Secondary malignant neoplasm of liver and intrahepatic bile duct: Secondary | ICD-10-CM | POA: Diagnosis not present

## 2015-09-11 DIAGNOSIS — C4359 Malignant melanoma of other part of trunk: Secondary | ICD-10-CM | POA: Diagnosis not present

## 2015-09-11 DIAGNOSIS — K7689 Other specified diseases of liver: Secondary | ICD-10-CM | POA: Diagnosis present

## 2015-09-11 LAB — CBC WITH DIFFERENTIAL/PLATELET
Basophils Absolute: 0 10*3/uL (ref 0.0–0.1)
Basophils Relative: 0 %
Eosinophils Absolute: 0.1 10*3/uL (ref 0.0–0.7)
Eosinophils Relative: 2 %
HCT: 42.4 % (ref 36.0–46.0)
Hemoglobin: 13.5 g/dL (ref 12.0–15.0)
Lymphocytes Relative: 32 %
Lymphs Abs: 2 10*3/uL (ref 0.7–4.0)
MCH: 29 pg (ref 26.0–34.0)
MCHC: 31.8 g/dL (ref 30.0–36.0)
MCV: 91.2 fL (ref 78.0–100.0)
Monocytes Absolute: 0.5 10*3/uL (ref 0.1–1.0)
Monocytes Relative: 9 %
Neutro Abs: 3.6 10*3/uL (ref 1.7–7.7)
Neutrophils Relative %: 57 %
Platelets: 152 10*3/uL (ref 150–400)
RBC: 4.65 MIL/uL (ref 3.87–5.11)
RDW: 13.3 % (ref 11.5–15.5)
WBC: 6.3 10*3/uL (ref 4.0–10.5)

## 2015-09-11 LAB — PROTIME-INR
INR: 1.08 (ref 0.00–1.49)
Prothrombin Time: 14.2 seconds (ref 11.6–15.2)

## 2015-09-11 MED ORDER — MIDAZOLAM HCL 2 MG/2ML IJ SOLN
INTRAMUSCULAR | Status: AC | PRN
  Administered 2015-09-11: 1 mg via INTRAVENOUS

## 2015-09-11 MED ORDER — FENTANYL CITRATE (PF) 100 MCG/2ML IJ SOLN
INTRAMUSCULAR | Status: AC
  Filled 2015-09-11: qty 2

## 2015-09-11 MED ORDER — LIDOCAINE HCL (PF) 1 % IJ SOLN
INTRAMUSCULAR | Status: AC
  Filled 2015-09-11: qty 10

## 2015-09-11 MED ORDER — MIDAZOLAM HCL 2 MG/2ML IJ SOLN
INTRAMUSCULAR | Status: AC
  Filled 2015-09-11: qty 2

## 2015-09-11 MED ORDER — FENTANYL CITRATE (PF) 100 MCG/2ML IJ SOLN
INTRAMUSCULAR | Status: AC | PRN
  Administered 2015-09-11: 50 ug via INTRAVENOUS

## 2015-09-11 MED ORDER — SODIUM CHLORIDE 0.9 % IV SOLN: INTRAVENOUS | Status: DC

## 2015-09-11 MED ORDER — GELATIN ABSORBABLE 12-7 MM EX MISC
CUTANEOUS | Status: AC
  Filled 2015-09-11: qty 1

## 2015-09-11 NOTE — Sedation Documentation (Signed)
Patient is resting comfortably. 

## 2015-09-11 NOTE — Procedures (Signed)
R lobe liver lesion Bx US guided 18 g core times four No comp/EBL

## 2015-09-11 NOTE — Discharge Instructions (Signed)
Liver Biopsy, Care After °Refer to this sheet in the next few weeks. These instructions provide you with information on caring for yourself after your procedure. Your health care provider may also give you more specific instructions. Your treatment has been planned according to current medical practices, but problems sometimes occur. Call your health care provider if you have any problems or questions after your procedure. °WHAT TO EXPECT AFTER THE PROCEDURE °After your procedure, it is typical to have the following: °· A small amount of discomfort in the area where the biopsy was done and in the right shoulder or shoulder blade. °· A small amount of bruising around the area where the biopsy was done and on the skin over the liver. °· Sleepiness and fatigue for the rest of the day. °HOME CARE INSTRUCTIONS  °· Rest at home for 1-2 days or as directed by your health care provider. °· Have a friend or family member stay with you for at least 24 hours. °· Because of the medicines used during the procedure, you should not do the following things in the first 24 hours: °¨ Drive. °¨ Use machinery. °¨ Be responsible for the care of other people. °¨ Sign legal documents. °¨ Take a bath or shower. °· There are many different ways to close and cover an incision, including stitches, skin glue, and adhesive strips. Follow your health care provider's instructions on: °¨ Incision care. °¨ Bandage (dressing) changes and removal. °¨ Incision closure removal. °· Do not drink alcohol in the first week. °· Do not lift more than 5 pounds or play contact sports for 2 weeks after this test. °· Take medicines only as directed by your health care provider. Do not take medicine containing aspirin or non-steroidal anti-inflammatory medicines such as ibuprofen for 1 week after this test. °· It is your responsibility to get your test results. °SEEK MEDICAL CARE IF:  °· You have increased bleeding from an incision that results in more than a  small spot of blood. °· You have redness, swelling, or increasing pain in any incisions. °· You notice a discharge or a bad smell coming from any of your incisions. °· You have a fever or chills. °SEEK IMMEDIATE MEDICAL CARE IF:  °· You develop swelling, bloating, or pain in your abdomen. °· You become dizzy or faint. °· You develop a rash. °· You are nauseous or vomit. °· You have difficulty breathing, feel short of breath, or feel faint. °· You develop chest pain. °· You have problems with your speech or vision. °· You have trouble balancing or moving your arms or legs. °  °This information is not intended to replace advice given to you by your health care provider. Make sure you discuss any questions you have with your health care provider. °  °Document Released: 11/19/2004 Document Revised: 05/23/2014 Document Reviewed: 06/28/2013 °Elsevier Interactive Patient Education ©2016 [employer] ° °

## 2015-09-11 NOTE — Sedation Documentation (Signed)
Patient is resting comfortably. Vitals stable.

## 2015-09-11 NOTE — H&P (Signed)
Chief Complaint: Patient was seen in consultation today for liver lesion biopsy at the request of Porter,Brittany P  Referring Physician(s): Porter,Brittany P  Supervising Physician: Brittany Porter  Patient Status:  Out-pt  History of Present Illness: Brittany Porter is a 70 y.o. female   Pt with hx many cancers 1997: B Breast Ca 2010: Ovarian and endometrial Ca 2013: Melanoma  Follows with Dr Brittany Porter New finding of rising tumor markers CT 08/25/15: IMPRESSION: 1. Solitary new 2.1 cm segment 7 right liver lobe mass, suspicious for liver metastasis. 2. 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 Extensive patchy bandlike consolidation at the left lung base, mildly increased, favor a combination of scarring and rounded atelectasis. 3. Additional findings include coronary atherosclerosis, diffuse hepatic steatosis, mild diffuse colonic diverticulosis and small bilateral fat containing inguinal hernias.  Request for biopsy of liver lesion Scheduled today for same   Past Medical History  Diagnosis Date  . Pleural effusion 04/2010  . Breast cancer ([place])   . Ovarian cancer ([place])   . Endometrial carcinoma ([place]) 10/2008  . Iron deficiency   . Diabetes mellitus   . Hyperlipidemia   . Fluid overload   . Arthritis   . History of radiation therapy 04/13/2009, 04/16/2009, 04/27/2009, 05/07/2009, 05/18/2009    3000 cGy to proximal vagina    Past Surgical History  Procedure Laterality Date  . Abdominal hysterectomy  10/2008  . Melanoma removal  02/2009, 07/2009  . Knee arthroscopy  04/2010    Right knee  . Mastectomy  1997    Bilateral with lymph nodes  . Ventral hernia repair  05/28/2012    Procedure: LAPAROSCOPIC VENTRAL HERNIA;  Surgeon: Adin Hector, MD;  Location: [facility];  Service: General;  Laterality: N/A;  . Insertion of mesh  05/28/2012    Procedure: INSERTION OF MESH;  Surgeon: Adin Hector, MD;  Location: [facility];  Service: General;  Laterality: N/A;  . Laparoscopic lysis of adhesions  05/28/2012    Procedure: LAPAROSCOPIC LYSIS OF ADHESIONS;  Surgeon: Adin Hector, MD;  Location: [facility];  Service: General;  Laterality: N/A;    Allergies: Sulfa antibiotics  Medications: Prior to Admission medications   Medication Sig Start Date End Date Taking? Authorizing Provider  Calcium Carb-Cholecalciferol (CALCIUM 600+D3) 600-800 MG-UNIT TABS Take 1 tablet by mouth 2 (two) times daily.   Yes Historical Provider, MD  Cholecalciferol (VITAMIN D3) 2000 UNITS TABS Take 2,000 mg by mouth daily.   Yes Historical Provider, MD  ferrous sulfate 325 (65 FE) MG tablet Take 325 mg by mouth daily with breakfast.   Yes Historical Provider, MD  fish oil-omega-3 fatty acids 1000 MG capsule Take 1 g by mouth 2 (two) times daily.    Yes Historical Provider, MD  glimepiride (AMARYL) 2 MG tablet Take 2 mg by mouth daily with breakfast.   Yes Historical Provider, MD  HYDROcodone-acetaminophen (NORCO/VICODIN) 5-325 MG tablet Take 1 tablet by mouth 2 (two) times daily as needed (cough). Patient taking differently: Take 1 tablet by mouth 2 (two) times daily as needed (arthritis pain/ cough).  08/31/15  Yes Brittany Marion Downer, MD  meloxicam (MOBIC) 15 MG tablet Take 15 mg by mouth daily.  10/08/14  Yes Historical Provider, MD  metFORMIN (GLUCOPHAGE) 500 MG tablet Take 500 mg by mouth 2 (two) times daily with a meal.     Yes Historical Provider, MD  PRESCRIPTION MEDICATION Apply 2 g topically daily as needed (ankle pain). Compounded cream from [facility], [place], [place] - ibuprofen, baclofen, lidocaine, gabapentin - mixed in a vanishing cream   Yes Historical Provider, MD  simvastatin (ZOCOR) 40 MG tablet Take 40 mg by mouth at bedtime.     Yes Historical Provider, MD  tamoxifen (NOLVADEX) 20 MG tablet Take 1 tablet (20 mg total) by mouth daily.  05/07/15  Yes Brittany Marion Downer, MD  furosemide (LASIX) 40 MG tablet Take 40 mg by mouth daily as needed for fluid or edema. Reported on 05/07/2015 10/08/14   Historical Provider, MD  ibuprofen (ADVIL,MOTRIN) 200 MG tablet Take 400 mg by mouth every 6 (six) hours as needed for headache (pain).     Historical Provider, MD     Family History  Problem Relation Age of Onset  . Stroke Mother   . Cancer Father   . Colon cancer Sister   . Stroke Sister   . Cancer Sister     breast  . Heart attack Brother   . Cancer Brother     skin  . Skin cancer Brother   . Cancer Sister     colon    Social History   Social History  . Marital Status: Married    Spouse Name: N/A  . Number of Children: N/A  . Years of Education: N/A   Social History Main Topics  . Smoking status: Never Smoker   . Smokeless tobacco: None  . Alcohol Use: No  . Drug Use: No  . Sexual Activity: Yes   Other Topics Concern  . None   Social History Narrative      Review of Systems: A 12 point ROS discussed and pertinent positives are indicated in the HPI above.  All other systems are negative.  Review of Systems  Constitutional: Negative for fever, diaphoresis, activity change, appetite change and fatigue.  Respiratory: Negative for shortness of breath.   Gastrointestinal: Negative for abdominal pain.  Musculoskeletal: Negative for back pain.  Neurological: Negative for weakness.  Psychiatric/Behavioral: Negative for behavioral problems and confusion.    Vital Signs: BP 162/94 mmHg  Pulse 78  Temp(Src) 98.1 F (36.7 C) (Oral)  Resp 16  Ht '5\' 3"'$  (1.6 m)  Wt 223 lb (101.152 kg)  BMI 39.51 kg/m2  SpO2 99%  Physical Exam  Constitutional: She is oriented to person, place, and time.  Cardiovascular: Normal rate, regular rhythm and normal heart sounds.   Pulmonary/Chest: Effort normal and breath sounds normal. She has no wheezes.  Abdominal: Soft. Bowel sounds are normal. There is no tenderness.    Musculoskeletal: Normal range of motion.  Neurological: She is alert and oriented to person, place, and time.  Skin: Skin is warm and dry.  Psychiatric: She has a normal mood and affect. Her behavior is normal. Judgment and thought content normal.  Nursing note and vitals reviewed.   Mallampati Score:  MD Evaluation Airway: WNL Heart: WNL Abdomen: WNL Chest/ Lungs: WNL ASA  Classification: 3 Mallampati/Airway Score: One  Imaging: Ct Chest W Contrast  08/25/2015  CLINICAL DATA:  Bilateral breast cancer diagnosed in 1997 status post bilateral mastectomy. Ovarian cancer, endometrial cancer and melanoma diagnosed in 2010 status post TAH-BSO, omentectomy, radiation therapy and chemotherapy. Patient presents for restaging with rising tumor markers. EXAM: CT CHEST, ABDOMEN, AND PELVIS WITH CONTRAST TECHNIQUE: Multidetector CT imaging of the chest, abdomen and pelvis was performed following the standard protocol during bolus administration of intravenous contrast. CONTRAST:  145m ISOVUE-300 IOPAMIDOL (ISOVUE-300) INJECTION 61% COMPARISON:  12/03/2014 CT chest, abdomen and pelvis and PET-CT. FINDINGS: CT CHEST Mediastinum/Nodes: Mild cardiomegaly. No pericardial fluid/thickening. Coronary atherosclerosis. Great vessels are normal in course and caliber. No central pulmonary emboli. Normal visualized thyroid. Normal esophagus. Bilateral axillary surgical clips. No axillary adenopathy. Lungs/Pleura: No pneumothorax. No right pleural effusion. Pleural thickening, curvilinear pleural calcification and small basilar pleural effusion in the left pleural space is not appreciably changed, with no discrete left pleural mass. Minimal layering secretions in the tracheal lumen. Medial right upper lobe 3 mm solid pulmonary nodule (series 5/image 19) is stable since 03/30/2010 and benign. Extensive patchy bandlike consolidation at the left lung base appears stable in the lingula and increased in the left lower lobe,  probably representing a combination of scarring and rounded atelectasis. No acute consolidative airspace disease or new significant pulmonary nodules. Musculoskeletal: Stable nonspecific sclerotic lesion in the left humeral head. No new bone lesions in the chest. Mild degenerative changes in the thoracic spine. Status post bilateral mastectomy. No mass or fluid collection in the ventral chest wall. CT ABDOMEN AND PELVIS Hepatobiliary: Diffuse hepatic steatosis. New hyperdense 2.1 x 1.6 cm segment 7 right liver lobe mass (series 2/image 41). No additional liver masses. Normal gallbladder with no radiopaque cholelithiasis. No biliary ductal dilatation. Pancreas: Normal, with no mass or duct dilation. Spleen: Normal size. No mass. 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 Adrenals/Urinary Tract: Normal adrenals. No hydronephrosis. Two hypodense subcentimeter renal cortical lesions in the upper right kidney are too small to characterize and appear stable, probably benign renal cysts. Otherwise normal kidneys, with no hydronephrosis. Normal bladder. Stomach/Bowel: Grossly normal stomach. Normal caliber small bowel with no small bowel wall thickening. Appendix not discretely visualized. Mild diffuse colonic diverticulosis, with no large bowel wall thickening or pericolonic fat stranding. Oral contrast progresses to the distal rectum. Vascular/Lymphatic: Atherosclerotic nonaneurysmal abdominal aorta. Patent portal, splenic, hepatic and renal veins. No pathologically enlarged lymph nodes in the abdomen or pelvis. Reproductive: Status post hysterectomy, with no abnormal findings at the vaginal cuff. No adnexal mass. Other: No pneumoperitoneum, ascites or focal fluid collection. Small bilateral fat containing inguinal hernias, unchanged. No evidence of recurrent ventral abdominal wall hernia status post hernia repair. Musculoskeletal: No aggressive appearing focal osseous  lesions. Subcentimeter sclerotic lesion in the left femoral head is not appreciably changed since 2010 and probably a benign bone island. Marked degenerative changes in the lumbar spine. IMPRESSION: 1. Solitary new 2.1 cm segment 7 right liver lobe mass, suspicious for liver metastasis. 2. 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 Extensive patchy bandlike consolidation at the left lung base, mildly increased, favor a combination of scarring and rounded atelectasis. 3. Additional findings include coronary atherosclerosis, diffuse hepatic steatosis, mild diffuse colonic diverticulosis and small bilateral fat containing inguinal hernias. Electronically Signed   By: JIlona SorrelM.D.   On: 08/25/2015 15:43   Ct Abdomen Pelvis W Contrast  08/25/2015  CLINICAL DATA:  Bilateral breast cancer diagnosed in 1997 status post bilateral mastectomy. Ovarian cancer, endometrial cancer and melanoma diagnosed in 2010 status post TAH-BSO, omentectomy, radiation therapy and chemotherapy. Patient presents for restaging with rising tumor markers. EXAM: CT CHEST, ABDOMEN, AND PELVIS WITH CONTRAST TECHNIQUE: Multidetector CT imaging of the chest, abdomen and pelvis was performed following the standard protocol during bolus administration of intravenous contrast. CONTRAST:  145m ISOVUE-300 IOPAMIDOL (ISOVUE-300) INJECTION 61% COMPARISON:  12/03/2014 CT chest, abdomen and pelvis and PET-CT. FINDINGS: CT CHEST Mediastinum/Nodes: Mild cardiomegaly. No pericardial fluid/thickening. Coronary atherosclerosis. Great vessels are normal in course and caliber. No central pulmonary emboli. Normal visualized thyroid. Normal esophagus. Bilateral axillary surgical clips. No axillary adenopathy. Lungs/Pleura: No pneumothorax. No right pleural effusion. Pleural thickening, curvilinear pleural calcification and small  basilar pleural effusion in the left pleural space is not appreciably changed, with no discrete left pleural mass. Minimal layering secretions in the tracheal lumen. Medial right upper lobe 3 mm solid pulmonary nodule (series 5/image 19) is stable since 03/30/2010 and benign. Extensive patchy bandlike consolidation at the left lung base appears stable in the lingula and increased in the left lower lobe, probably representing a combination of scarring and rounded atelectasis. No acute consolidative airspace disease or new significant pulmonary nodules. Musculoskeletal: Stable nonspecific sclerotic lesion in the left humeral head. No new bone lesions in the chest. Mild degenerative changes in the thoracic spine. Status post bilateral mastectomy. No mass or fluid collection in the ventral chest wall. CT ABDOMEN AND PELVIS Hepatobiliary: Diffuse hepatic steatosis. New hyperdense 2.1 x 1.6 cm segment 7 right liver lobe mass (series 2/image 41). No additional liver masses. Normal gallbladder with no radiopaque cholelithiasis. No biliary ductal dilatation. Pancreas: Normal, with no mass or duct dilation. Spleen: Normal size. No mass. 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 Adrenals/Urinary Tract: Normal adrenals. No hydronephrosis. Two hypodense subcentimeter renal cortical lesions in the upper right kidney are too small to characterize and appear stable, probably benign renal cysts. Otherwise normal kidneys, with no hydronephrosis. Normal bladder. Stomach/Bowel: Grossly normal stomach. Normal caliber small bowel with no small bowel wall thickening. Appendix not discretely visualized. Mild diffuse colonic diverticulosis, with no large bowel wall thickening or pericolonic fat stranding. Oral contrast progresses to the distal rectum. Vascular/Lymphatic: Atherosclerotic nonaneurysmal abdominal aorta. Patent portal, splenic, hepatic and renal veins. No pathologically enlarged  lymph nodes in the abdomen or pelvis. Reproductive: Status post hysterectomy, with no abnormal findings at the vaginal cuff. No adnexal mass. Other: No pneumoperitoneum, ascites or focal fluid collection. Small bilateral fat containing inguinal hernias, unchanged. No evidence of recurrent ventral abdominal wall hernia status post hernia repair. Musculoskeletal: No aggressive appearing focal osseous lesions. Subcentimeter sclerotic lesion in the left femoral head is not appreciably changed since 2010 and probably a benign bone island. Marked degenerative changes in the lumbar spine. IMPRESSION: 1. Solitary new 2.1 cm segment 7 right liver lobe mass, suspicious for liver metastasis. 2. 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 Extensive patchy bandlike consolidation at the left lung base, mildly increased, favor a combination of scarring and rounded atelectasis. 3. Additional findings include coronary atherosclerosis, diffuse hepatic steatosis, mild diffuse colonic diverticulosis and small bilateral fat containing inguinal hernias. Electronically Signed   By: JIlona SorrelM.D.   On: 08/25/2015 15:43    Labs:  CBC:  Recent Labs  05/07/15 1152 06/25/15 1245 07/30/15 1044 09/11/15 0620  WBC 7.6 6.7 8.7 6.3  HGB 12.8 13.3 14.2 13.5  HCT 39.4 40.5 43.9 42.4  PLT 168 148 179 152    COAGS:  Recent Labs  09/11/15 0620  INR 1.08    BMP:  Recent Labs  05/07/15 1152 06/25/15 1245 07/30/15 1044 08/25/15 1042  NA 144 143 144 143  K 4.5  4.1 4.0 5.0  CO2 30* 27 30* 29  GLUCOSE 238* 190* 220* 188*  BUN 13.5 12.8 15.3 9.9  CALCIUM 8.8 8.6 8.6 9.3  CREATININE 0.8 0.8 0.9 0.8    LIVER FUNCTION TESTS:  Recent Labs  05/07/15 1152 06/25/15 1245 07/30/15 1044 08/25/15 1042  BILITOT 0.81 0.68 0.88 0.78  AST '21 23 26 28  '$ ALT '15 16 22 19  '$ ALKPHOS 62 49 48 44    PROT 6.5 6.6 7.0 6.7  ALBUMIN 3.3* 3.3* 3.5 3.4*    TUMOR MARKERS: No results for input(s): AFPTM, CEA, CA199, CHROMGRNA in the last 8760 hours.  Assessment and Plan:  Hx Breast; ovarian; endometrial and melanoma cancer New tumor markers rising CT reveals liver lesion Scheduled for biopsy today Risks and Benefits discussed with the patient including, but not limited to bleeding, infection, damage to adjacent structures or low yield requiring additional tests. All of the patient's questions were answered, patient is agreeable to proceed. Consent signed and in chart.   Thank you for this interesting consult.  I greatly enjoyed meeting AANYA HAYNES and look forward to participating in their care.  A copy of this report was sent to the requesting provider on this date.  Electronically Signed: Monia Sabal A 09/11/2015, 7:27 AM   I spent a total of  30 Minutes   in face to face in clinical consultation, greater than 50% of which was counseling/coordinating care for liver lesion bx

## 2015-09-11 NOTE — Sedation Documentation (Signed)
Patient is resting comfortably. No complaints at this time.

## 2015-09-14 LAB — GLUCOSE, CAPILLARY: Glucose-Capillary: 144 mg/dL — ABNORMAL HIGH (ref 65–99)

## 2015-09-15 ENCOUNTER — Encounter: Payer: Self-pay | Admitting: Oncology

## 2015-09-15 NOTE — Progress Notes (Signed)
MEDICAL ONCOLOGY  Discussed liver biopsy information with pathologist. Identical with pleural biopsy path from 04-2010; additional imunostains also support breast primary.  Godfrey Pick, MD

## 2015-09-17 ENCOUNTER — Other Ambulatory Visit: Payer: Self-pay

## 2015-09-17 DIAGNOSIS — C50911 Malignant neoplasm of unspecified site of right female breast: Secondary | ICD-10-CM

## 2015-09-17 DIAGNOSIS — C569 Malignant neoplasm of unspecified ovary: Secondary | ICD-10-CM

## 2015-09-17 DIAGNOSIS — C50912 Malignant neoplasm of unspecified site of left female breast: Principal | ICD-10-CM

## 2015-09-19 ENCOUNTER — Encounter: Payer: Self-pay | Admitting: Oncology

## 2015-09-19 NOTE — Progress Notes (Signed)
Medical Oncology  Requested path report of previous melanoma from Dr Ledell Peoples office. Received office notes from 05-2011 and 11-2011 which mention melanoma on back 2010, but no path included.  Information received to be scanned into this EMR.  Godfrey Pick, MD

## 2015-09-20 ENCOUNTER — Other Ambulatory Visit: Payer: Self-pay | Admitting: Oncology

## 2015-09-21 ENCOUNTER — Encounter: Payer: Self-pay | Admitting: Oncology

## 2015-09-21 ENCOUNTER — Ambulatory Visit (HOSPITAL_BASED_OUTPATIENT_CLINIC_OR_DEPARTMENT_OTHER): Payer: Medicare Other | Admitting: Oncology

## 2015-09-21 ENCOUNTER — Ambulatory Visit (HOSPITAL_BASED_OUTPATIENT_CLINIC_OR_DEPARTMENT_OTHER): Payer: Medicare Other

## 2015-09-21 VITALS — BP 164/83 | HR 80 | Temp 98.3°F | Resp 18 | Ht 63.0 in | Wt 228.4 lb

## 2015-09-21 DIAGNOSIS — C50911 Malignant neoplasm of unspecified site of right female breast: Secondary | ICD-10-CM | POA: Diagnosis not present

## 2015-09-21 DIAGNOSIS — C50919 Malignant neoplasm of unspecified site of unspecified female breast: Secondary | ICD-10-CM

## 2015-09-21 DIAGNOSIS — C569 Malignant neoplasm of unspecified ovary: Secondary | ICD-10-CM

## 2015-09-21 DIAGNOSIS — Z85820 Personal history of malignant melanoma of skin: Secondary | ICD-10-CM | POA: Diagnosis not present

## 2015-09-21 DIAGNOSIS — C787 Secondary malignant neoplasm of liver and intrahepatic bile duct: Secondary | ICD-10-CM

## 2015-09-21 DIAGNOSIS — C50912 Malignant neoplasm of unspecified site of left female breast: Secondary | ICD-10-CM | POA: Diagnosis not present

## 2015-09-21 DIAGNOSIS — C55 Malignant neoplasm of uterus, part unspecified: Secondary | ICD-10-CM

## 2015-09-21 DIAGNOSIS — C782 Secondary malignant neoplasm of pleura: Principal | ICD-10-CM

## 2015-09-21 DIAGNOSIS — Z8543 Personal history of malignant neoplasm of ovary: Secondary | ICD-10-CM

## 2015-09-21 DIAGNOSIS — C4359 Malignant melanoma of other part of trunk: Secondary | ICD-10-CM

## 2015-09-21 DIAGNOSIS — Z853 Personal history of malignant neoplasm of breast: Secondary | ICD-10-CM

## 2015-09-21 DIAGNOSIS — C561 Malignant neoplasm of right ovary: Secondary | ICD-10-CM

## 2015-09-21 LAB — COMPREHENSIVE METABOLIC PANEL
ALT: 23 U/L (ref 0–55)
AST: 44 U/L — ABNORMAL HIGH (ref 5–34)
Albumin: 3.5 g/dL (ref 3.5–5.0)
Alkaline Phosphatase: 50 U/L (ref 40–150)
Anion Gap: 9 mEq/L (ref 3–11)
BUN: 12 mg/dL (ref 7.0–26.0)
CO2: 30 mEq/L — ABNORMAL HIGH (ref 22–29)
Calcium: 9.2 mg/dL (ref 8.4–10.4)
Chloride: 105 mEq/L (ref 98–109)
Creatinine: 0.7 mg/dL (ref 0.6–1.1)
EGFR: 89 mL/min/{1.73_m2} — ABNORMAL LOW (ref >90)
Glucose: 87 mg/dl (ref 70–140)
Potassium: 3.8 mEq/L (ref 3.5–5.1)
Sodium: 144 mEq/L (ref 136–145)
Total Bilirubin: 0.65 mg/dL (ref 0.20–1.20)
Total Protein: 6.8 g/dL (ref 6.4–8.3)

## 2015-09-21 LAB — CBC WITH DIFFERENTIAL/PLATELET
BASO%: 0.5 % (ref 0.0–2.0)
Basophils Absolute: 0 10*3/uL (ref 0.0–0.1)
EOS%: 2 % (ref 0.0–7.0)
Eosinophils Absolute: 0.1 10*3/uL (ref 0.0–0.5)
HCT: 44.2 % (ref 34.8–46.6)
HGB: 14.1 g/dL (ref 11.6–15.9)
LYMPH%: 33 % (ref 14.0–49.7)
MCH: 28.7 pg (ref 25.1–34.0)
MCHC: 32 g/dL (ref 31.5–36.0)
MCV: 89.7 fL (ref 79.5–101.0)
MONO#: 0.6 10*3/uL (ref 0.1–0.9)
MONO%: 7.5 % (ref 0.0–14.0)
NEUT#: 4.3 10*3/uL (ref 1.5–6.5)
NEUT%: 57 % (ref 38.4–76.8)
Platelets: 157 10*3/uL (ref 145–400)
RBC: 4.93 10*6/uL (ref 3.70–5.45)
RDW: 14 % (ref 11.2–14.5)
WBC: 7.5 10*3/uL (ref 3.9–10.3)
lymph#: 2.5 10*3/uL (ref 0.9–3.3)

## 2015-09-21 NOTE — Progress Notes (Signed)
OFFICE PROGRESS NOTE   Sep 21, 2015   Physicians: Marcy Panning), [name], [name], [name], [name], (James Kinard), [facility], [place].Dah[employer]orf/ [employer]  INTERVAL HISTORY:  Patient is seen, alone for visit, now with documented progression of metastatic breast cancer with apparent solitary liver metastasis.  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 She had no obvious progression on PET 11-2014 (done for the rising marker), however CT CAP 08-25-15 showed new single liver lesion. As patient also has history of melanoma, endometrial and ovarian cancers, she had US biopsy by IR on 09-11-15 (tamoxifen held x 3 weeks prior to biopsy). Pathology [phone]) metastatic carcinoma morphologically identical to the pleural biopsy from 2011 and with immunohistochemical stains consistent with metastatic breast,  At my request, [facility] is trying to obtain chemotherapy records from [place], adjuvant therapy for the breast cancer. We have obtained medical oncology note from 11-12-2008 out of [place], which will be scanned into this EMR. From that note "Bilateral breast cancer diagnosed 1997, bilateral mastectomies and bilateral axillary lymph node dissections. Per patient's recollection, all of her nodes were negative. Postoperatively she received adjuvant chemotherapy. She cannot recall the chemotherapy type, but did not get sick with it and did not lose her hair. I suspect that she got CMF for 8 cycles. Post chemotherapy she received radiation therapy."  She had PAC for the adjuvant breast chemotherapy.  In 2010 she had carboplatin taxol for gyn cancer, also with PAC.    Patient has been unable to tell any difference off of tamoxifen, tho stiffness especially in hands is still much better since she stopped letrozole. Respiratory infection that she had at last visit has resolved  entirely. She denies any new or different pain, SOB, abdominal or pelvic discomfort, swelling LE, nausea. Appetite and energy are at baseline. No bleeding.    No PAC Patient confirms that she had genetics testing 2010, negative BRCA. CA 2729 was 71 in 04-2010 Flu vaccine 03-04-15   She will see PCP Dr Tamala Julian in August. She is due back to Dr Allyson Sabal upcoming. She is on year follow up with Dr Skeet Latch, last seen 11-13-14.  ONCOLOGIC HISTORY Bilateral breast cancer treated with bilateral mastectomies and bilateral axillary node dissections by Dr Andrey Campanile in 1997; she recalls that all nodes were negative. She had chemotherapy by Dr Tressie Stalker and bilateral radiation by Dr Danny Lawless. We have obtained medical oncology note from 11-12-2008 out of [place], which will be scanned into this EMR. From that note "Bilateral breast cancer diagnosed 1997, bilateral mastectomies and bilateral axillary lymph node dissections. Per patient's recollection, all of her nodes were negative. Postoperatively she received adjuvant chemotherapy. She cannot recall the chemotherapy type, but did not get sick with it and did not lose her hair. I suspect that she got CMF for 8 cycles. Post chemotherapy she received radiation therapy."  She had PAC for the adjuvant breast chemotherapy. 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 She was begun on letrozole at that time.Note CA 2729 was elevated at time of recurrent disease. She continued letrozole unitl rising CA 2729 in 03-2015, with hormonal blocker changed to tamoxifen 04-05-15. CT CAP 08-25-15 had new single liver lesion, biopsied by IR on 09-11-15, confirming metastatic breast.   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 She received 6 cycles adjuvant taxol carboplatin thru Nov  2010 and vaginal cuff brachytherapy completed Jan 2011. Note CA125 was elevated at presentation.  Melanoma upper mid back treated with wide local excision 02-2012 ( that path not located in EMR       Objective:  Vital signs in last 24 hours:  BP 164/83 mmHg  Pulse 80  Temp(Src) 98.3 F (36.8 C) (Oral)  Resp 18  Ht '5\' 3"'  (1.6 m)  Wt 228 lb 6.4 oz (103.602 kg)  BMI 40.47 kg/m2  SpO2 97% Weight down 1 lb. Alert, oriented and appropriate. Ambulatory without assistance.  Normal hair pattern.  HEENT:PERRL, sclerae not icteric. Oral mucosa moist without lesions, posterior pharynx clear.  Neck supple. No JVD.  Lymphatics:no cervical,supraclavicular, axillary adenopathy Resp: clear to auscultation bilaterally and normal percussion bilaterally Cardio: regular rate and rhythm. No gallop. GI: soft, nontender, not distended, no mass or organomegaly. Normally active bowel sounds. Surgical incision not remarkable. Musculoskeletal/ Extremities: without pitting edema, cords, tenderness Neuro:  nonfocal . PSYCH appropriate mood and affect Skin without rash, ecchymosis, petechiae. No evidence of local recurrence at large scar upper mid back. Breasts: bilateral mastectomy scars without evidence of local recurrence. Axillae benign.  Lab Results:  Results for orders placed or performed in visit on 09/21/15  CBC with Differential  Result Value Ref Range   WBC 7.5 3.9 - 10.3 10e3/uL   NEUT# 4.3 1.5 - 6.5 10e3/uL   HGB 14.1 11.6 - 15.9 g/dL   HCT 44.2 34.8 - 46.6 %   Platelets 157 145 - 400 10e3/uL   MCV 89.7 79.5 - 101.0 fL   MCH 28.7 25.1 - 34.0 pg   MCHC 32.0 31.5 - 36.0 g/dL   RBC 4.93 3.70 - 5.45 10e6/uL   RDW 14.0 11.2 - 14.5 %   lymph# 2.5 0.9 - 3.3 10e3/uL   MONO# 0.6 0.1 - 0.9 10e3/uL   Eosinophils Absolute 0.1 0.0 - 0.5 10e3/uL   Basophils Absolute 0.0 0.0 - 0.1 10e3/uL   NEUT% 57.0 38.4 - 76.8 %   LYMPH% 33.0 14.0 - 49.7 %   MONO% 7.5 0.0 - 14.0 %   EOS% 2.0 0.0 - 7.0 %    BASO% 0.5 0.0 - 2.0 %  Comprehensive metabolic panel  Result Value Ref Range   Sodium 144 136 - 145 mEq/L   Potassium 3.8 3.5 - 5.1 mEq/L   Chloride 105 98 - 109 mEq/L   CO2 30 (H) 22 - 29 mEq/L   Glucose 87 70 - 140 mg/dl   BUN 12.0 7.0 - 26.0 mg/dL   Creatinine 0.7 0.6 - 1.1 mg/dL   Total Bilirubin 0.65 0.20 - 1.20 mg/dL   Alkaline Phosphatase 50 40 - 150 U/L   AST 44 (H) 5 - 34 U/L   ALT 23 0 - 55 U/L   Total Protein 6.8 6.4 - 8.3 g/dL   Albumin 3.5 3.5 - 5.0 g/dL   Calcium 9.2 8.4 - 10.4 mg/dL   Anion Gap 9 3 - 11 mEq/L   EGFR 89 (L) >90 ml/min/1.73 m2     Studies/Results:  Pathology information discussed directly with pathologist when that was finalized:  G [phone]) Patient: Brittany, Porter Collected: 09/11/2015 Client: [facility] Accession: [id number] Received: 09/11/2015 Art Hoss Diagnosis Liver, needle/core biopsy - POSITIVE FOR METASTATIC CARCINOMA. - SEE COMMENT. Microscopic Comment Sections demonstrate tumor forming glandular nests with low grade  cytology involving benign liver tissue. Immunohistochemical stains are performed. The tumor is positive for cytokeratin 7, estrogen receptor and progesterone receptor. An immunohistochemical stain for gross cystic disease fluid protein is performed which demonstrates some degree of positivity. The tumor is negative for calretinin, WT-1 and cytokeratin 20. The morphology of the current tumor is compared with the previous case ([id number]) and the tumor morphology is identical to the morphology seen in the previous case (in some areas). The staining pattern coupled with the morphology is most consistent with metastatic carcinoma of breast primary source. Please correlate with clinical and radiologic impression. The findings are called to Dr. Marko Plume on 09/15/15. (RAH;gt, 09/15/15) Willeen Niece MD    Medications: I have reviewed the patient's current medications.  DISCUSSION  Information from the  liver biopsy discussed. Tho with limited liver involvement could consider still trying hormonal intervention, it seems preferable to change to systemic chemotherapy now. I have explained that likely there is other metastatic disease at microscopic level, which hopefully the chemotherapy can also improve. (possibly if stable disease later could consider some ablation procedure for solitary liver met). With history as above, Xeloda seems very reasonable to use now, and patient would like oral chemo in preference to another PAC. We have discussed mechanism of action of the Xeloda, bid dosing x 14 days every 21 days following counts and side effects. We have discussed side effects including some individuals with DPD deficiency who have marked side effects, GI symptoms, cytopenias, hopefully no hair loss. With prior treatment, would begin at less than full 1000 mg/m2 bid (2.1 m2), using 1500 mg bid as starting dose. If unable to obtain Xeloda or cannot tolerate, would consider gemzar. Oral chemotherapy pharmacist aware and will make contact with her for further teaching and to assist in obtaining the Xeloda if that is the choice.   Patient is comfortable with our discussion, but knows that she can let me know if any questions or concerns after further teaching by pharmacist.    Assessment/Plan:  1.Bilateral breast cancers 1997 treated with bilateral mastectomies and axillary node dissections, adjuvant chemotherapy and radiation. 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 On Letrozole from 04-2010 until 03-2015 changed to tamoxifen due to increasing marker, tho PET CT 11-2013 did not show imaging correlation.  BRCA reportedly normal. CT CAP 08-25-15 stable including lungs except new solitary liver lesion. Tamoxifen held since 08-31-15.   Liver biopsy confirms metastatic breast cancer. Anticipate beginning Xeloda and I will see her back during first course or  prior if needed.  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 CA 125 stable in normal range. She saw Dr Skeet Latch 11-13-14 and will see gyn onc annually. 3. Melanoma upper back 2013, treated with wide excision. Followed by dermatology. Some records received from Dr Allyson Sabal, which did not include actual path report; those to be scanned into this EMR. 4.would need PAC for any IV chemo due to bilateral axillary node surgery and bilateral radiation (fields not known) 5. Sclerotic lesion in left humeral head stable back to 02-2013 6.morbid obesity, BMI 40.8. Weight loss due to improvement in eating habits and increased exercise would be beneficial. 7.flu vaccine 03-04-15 8.recent bronchitis: similar problems thruout community, improving slowly. Doubt cancer related. 9.chronic orthopedic problems left foot: follows with podiatrist. 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 Arthralgias in hands better off aromatase inhibitor   Patient encouraged to ask questions and I have offered to talk  with her husband (who waited in lobby). She preferred to speak with husband by herself for now.  I discussed with Dr Tillman Sers as noted and  communicated directly with [facility] pharmacist; I have communicated directly with HIM for remote treatment records as noted. Time spent 40 min including >50% counseling and coordination of care. Route Dr [name], cc Drs Skeet Latch and Dr Oneita Jolly, MD   09/21/2015, 3:11 PM

## 2015-09-22 LAB — CANCER ANTIGEN 27.29: CA 27.29: 168.9 U/mL — ABNORMAL HIGH (ref 0.0–38.6)

## 2015-09-22 LAB — CANCER ANTIGEN 125 (PARALLEL TESTING): CA 125: 18 U/mL (ref <35)

## 2015-09-22 LAB — CA 125: Cancer Antigen (CA) 125: 16.7 U/mL (ref 0.0–38.1)

## 2015-09-23 ENCOUNTER — Telehealth: Payer: Self-pay

## 2015-09-23 NOTE — Telephone Encounter (Signed)
Pt is calling to see if she still needs her appt on 5/18 for lab and MD since she saw Dr Marko Plume on 5/8. Dr Edwyna Shell OV note not completed at this time. No POF completed either.

## 2015-09-24 ENCOUNTER — Other Ambulatory Visit: Payer: Self-pay | Admitting: Oncology

## 2015-09-24 DIAGNOSIS — C50912 Malignant neoplasm of unspecified site of left female breast: Principal | ICD-10-CM

## 2015-09-24 DIAGNOSIS — C50911 Malignant neoplasm of unspecified site of right female breast: Secondary | ICD-10-CM

## 2015-09-24 MED ORDER — CAPECITABINE 500 MG PO TABS: 800.0000 mg/m2 | ORAL_TABLET | Freq: Two times a day (BID) | ORAL | Status: DC

## 2015-09-24 MED FILL — CAPECITABINE 500 MG TABLET: 500 | 21 days supply | Qty: 84 | Fill #0

## 2015-09-25 ENCOUNTER — Encounter: Payer: Self-pay | Admitting: Pharmacist

## 2015-09-25 ENCOUNTER — Telehealth: Payer: Self-pay | Admitting: *Deleted

## 2015-09-25 NOTE — Telephone Encounter (Signed)
FYI Call from patient asking when to begin Xeloda she has received .  Collaborated with Production designer, theatre/television/film. Patient instructed to start Xeloda 1500 mg today with food and to keep f/u appointment 10-01-2015 as scheduled.  Advised she call if any diarrhea or changes in symptoms.  I was just there Monday I was hoping I wouldn't have to come back so soon but I'll be there.

## 2015-09-25 NOTE — Telephone Encounter (Signed)
Brittany Marion Downer, MD  Baruch Merl, RN           If she starts xeloda in next couple of days, that will be fine to let me see her with lab on 5-18  thanks       Previous Messages     ----- Message -----   From: Shirlean Mylar, [place]: 09/24/2015  3:12 PM    To: Gordy Levan, MD   Spoke with [name] just now. Should know about copay from [facility] today hopefully. Then she can pick up tomorrow. There is assistance available if needed.  ----- Message -----   From: Gordy Levan, MD   Sent: 09/24/2015  2:02 PM    To: Baruch Merl, RN, *   Gerald Stabs -  can you please let me know after you speak with patient and look into xeloda, so I can get next appointments set up correctly   Thank you!  Brittany

## 2015-09-25 NOTE — Progress Notes (Signed)
Oral Chemotherapy Pharmacist Encounter   I spoke with patient for overview of new oral chemotherapy medication: Xeloda. Pt is doing well. The prescriptions have been sent to the [facility] for benefit analysis and approval. Copay is $400. Patient signed up for patient advocate copay assistance card for Breast Cancer ($5,000 total). Pt can pick up medication on 5/12  Counseled patient on administration, dosing, side effects, safe handling, and monitoring. Side effects include but not limited to: Fatigue, diarrhea, mouth sores, and hand/foot syndrome.  [name] voiced understanding and appreciation.   All questions answered.  Will follow up in 1 week for adherence and toxicity management.   Thank you,  Montel Clock, PharmD, [facility]

## 2015-09-27 DIAGNOSIS — C50919 Malignant neoplasm of unspecified site of unspecified female breast: Secondary | ICD-10-CM | POA: Insufficient documentation

## 2015-09-27 DIAGNOSIS — C787 Secondary malignant neoplasm of liver and intrahepatic bile duct: Secondary | ICD-10-CM

## 2015-10-01 ENCOUNTER — Ambulatory Visit (HOSPITAL_BASED_OUTPATIENT_CLINIC_OR_DEPARTMENT_OTHER): Payer: Medicare Other | Admitting: Oncology

## 2015-10-01 ENCOUNTER — Encounter: Payer: Self-pay | Admitting: Oncology

## 2015-10-01 ENCOUNTER — Other Ambulatory Visit (HOSPITAL_BASED_OUTPATIENT_CLINIC_OR_DEPARTMENT_OTHER): Payer: Medicare Other

## 2015-10-01 ENCOUNTER — Telehealth: Payer: Self-pay | Admitting: Oncology

## 2015-10-01 ENCOUNTER — Other Ambulatory Visit: Payer: Self-pay | Admitting: Oncology

## 2015-10-01 VITALS — BP 137/67 | HR 75 | Temp 98.9°F | Resp 18 | Wt 230.9 lb

## 2015-10-01 DIAGNOSIS — C787 Secondary malignant neoplasm of liver and intrahepatic bile duct: Secondary | ICD-10-CM

## 2015-10-01 DIAGNOSIS — Z853 Personal history of malignant neoplasm of breast: Secondary | ICD-10-CM

## 2015-10-01 DIAGNOSIS — C50919 Malignant neoplasm of unspecified site of unspecified female breast: Secondary | ICD-10-CM

## 2015-10-01 DIAGNOSIS — Z85820 Personal history of malignant melanoma of skin: Secondary | ICD-10-CM | POA: Diagnosis not present

## 2015-10-01 DIAGNOSIS — C569 Malignant neoplasm of unspecified ovary: Secondary | ICD-10-CM

## 2015-10-01 DIAGNOSIS — Z5111 Encounter for antineoplastic chemotherapy: Secondary | ICD-10-CM

## 2015-10-01 DIAGNOSIS — C55 Malignant neoplasm of uterus, part unspecified: Secondary | ICD-10-CM

## 2015-10-01 DIAGNOSIS — C782 Secondary malignant neoplasm of pleura: Secondary | ICD-10-CM

## 2015-10-01 DIAGNOSIS — C50911 Malignant neoplasm of unspecified site of right female breast: Secondary | ICD-10-CM

## 2015-10-01 DIAGNOSIS — C50912 Malignant neoplasm of unspecified site of left female breast: Secondary | ICD-10-CM

## 2015-10-01 DIAGNOSIS — Z8543 Personal history of malignant neoplasm of ovary: Secondary | ICD-10-CM

## 2015-10-01 LAB — COMPREHENSIVE METABOLIC PANEL
ALT: 21 U/L (ref 0–55)
AST: 36 U/L — ABNORMAL HIGH (ref 5–34)
Albumin: 3.4 g/dL — ABNORMAL LOW (ref 3.5–5.0)
Alkaline Phosphatase: 61 U/L (ref 40–150)
Anion Gap: 7 mEq/L (ref 3–11)
BUN: 13.9 mg/dL (ref 7.0–26.0)
CO2: 30 mEq/L — ABNORMAL HIGH (ref 22–29)
Calcium: 8.8 mg/dL (ref 8.4–10.4)
Chloride: 106 mEq/L (ref 98–109)
Creatinine: 0.7 mg/dL (ref 0.6–1.1)
EGFR: 86 mL/min/{1.73_m2} — ABNORMAL LOW (ref >90)
Glucose: 130 mg/dl (ref 70–140)
Potassium: 4.8 mEq/L (ref 3.5–5.1)
Sodium: 143 mEq/L (ref 136–145)
Total Bilirubin: 0.68 mg/dL (ref 0.20–1.20)
Total Protein: 6.4 g/dL (ref 6.4–8.3)

## 2015-10-01 LAB — CBC WITH DIFFERENTIAL/PLATELET
BASO%: 0.7 % (ref 0.0–2.0)
Basophils Absolute: 0 10*3/uL (ref 0.0–0.1)
EOS%: 1.6 % (ref 0.0–7.0)
Eosinophils Absolute: 0.1 10*3/uL (ref 0.0–0.5)
HCT: 40.1 % (ref 34.8–46.6)
HGB: 12.9 g/dL (ref 11.6–15.9)
LYMPH%: 28.7 % (ref 14.0–49.7)
MCH: 28.9 pg (ref 25.1–34.0)
MCHC: 32.2 g/dL (ref 31.5–36.0)
MCV: 89.8 fL (ref 79.5–101.0)
MONO#: 0.6 10*3/uL (ref 0.1–0.9)
MONO%: 8.6 % (ref 0.0–14.0)
NEUT#: 4.2 10*3/uL (ref 1.5–6.5)
NEUT%: 60.4 % (ref 38.4–76.8)
Platelets: 155 10*3/uL (ref 145–400)
RBC: 4.46 10*6/uL (ref 3.70–5.45)
RDW: 13.6 % (ref 11.2–14.5)
WBC: 6.9 10*3/uL (ref 3.9–10.3)
lymph#: 2 10*3/uL (ref 0.9–3.3)

## 2015-10-01 MED ORDER — ONDANSETRON HCL 8 MG PO TABS
8.0000 mg | ORAL_TABLET | Freq: Three times a day (TID) | ORAL | Status: DC | PRN
Start: 2015-10-01 — End: 2018-01-09

## 2015-10-01 NOTE — Telephone Encounter (Signed)
Gave and printed appt sched and avs fo rpt for May °

## 2015-10-01 NOTE — Progress Notes (Signed)
OFFICE PROGRESS NOTE   Oct 02, 2015   Physicians: Marcy Panning), [name], [name], [place], [place], (James Kinard), [facility], [place].Dahldorf/ Mina Marble of [employer]  Chemotherapy and radiation therapy information from 1997 - 98 has been obtained from microfilm, summarized below and will be scanned into this EMR.   INTERVAL HISTORY:  Patient is seen, alone for visit, now having begun xeloda for metastatic breast cancer which has recently progressed in single area in liver. She began cycle 1 xeloda on 09-25-15, planned x 14 days every 21 days.   Patient is tolerating cycle 1 xeloda well thus far, with no stomatitis, diarrhea, nausea or skin rash. She has been a little fatigued, but is also active at home and coordinating church breakfast every Thurs AM. Appetite is fine. She has no new or different pain, no SOB or cough, no LE swelling, no bleeding, no fever or symptoms of infection. Remainder of 10 point Review of Systems negative.     No PAC Patient confirms that she had genetics testing 2010, negative BRCA. CA 2729 was 71 in 04-2010 Flu vaccine 03-04-15  Patient lost her wedding rings last pm, after 53 years.   ONCOLOGIC HISTORY We have obtained chemotherapy and radiation therapy information from 1997-98 as well as medical oncology note from 11-12-2008 out of [facility] EMR, all of which will be scanned into EPIC.  Bilateral stage 3 node negative, ER PR negative  breast cancers treated with bilateral mastectomies and bilateral axillary node dissections by Dr Andrey Campanile 11-20- 1997 at age 62 and premenopausal. Full path report not included in this information, however information in note gives path [phone] adenocarcinoma moderately well differentiated 5.5 cm on right, no lymphatic or vascular invasion and on left 6 cm well differentiated , no lymphatic invasion, DNA index 1.09 and "ER PR receptors reported negative".   She received 6 cycles of CMF chemotherapy  from 05-15-96 thru 08-13-96 by Dr Tressie Stalker. She had radiation by Dr Nila Nephew 5040 cGy to bilateral chest wall with scars to 5940 cGy and bilateral supraclavicular regions to 4140 cGy, given 10-14-96 thru 11-29-96.  She had PAC for the adjuvant chemotherapy. 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 She was begun on letrozole at that time.Note CA 2729 was elevated at time of recurrent disease. She continued letrozole unitl rising CA 2729 in 03-2015, with hormonal blocker changed to tamoxifen 04-05-15. CT CAP 08-25-15 had new single liver lesion, biopsied by IR on 09-11-15, confirming metastatic breast. She began xeloda 09-25-15 at 1500 mg bid.   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 She received 6 cycles adjuvant taxol carboplatin thru Nov 2010 and vaginal cuff brachytherapy completed Jan 2011. Note CA125 was elevated at presentation. PAC used for chemotherapy.  Melanoma upper mid back treated with wide local excision 02-2012 ( that path not located in EMR)    Objective:  Vital signs in last 24 hours:  BP 137/67 mmHg  Pulse 75  Temp(Src) 98.9 F (37.2 C) (Oral)  Resp 18  Wt 230 lb 14.4 oz (104.736 kg)  SpO2 100%  weight up 2 lbs. Alert, oriented and appropriate. Ambulatory without assistance.  No alopecia  HEENT:PERRL, sclerae not icteric. Oral mucosa moist without lesions, posterior pharynx clear.  Neck supple. No JVD.  Lymphatics:no cervical,supraclavicular adenopathy Resp: diminished BS left lower posteriorly otherwise clear  to auscultation bilaterally and normal percussion bilaterally Cardio: regular rate and rhythm. No gallop. GI: abdomen obese, soft, nontended, cannot appreciate mass or organomegaly. Normally active bowel sounds.  Musculoskeletal/ Extremities: UE/ LEwithout pitting  edema, cords, tenderness Neuro: no peripheral neuropathy. Otherwise nonfocal . PSYCH appropriate mood and affect Skin without rash, ecchymosis, petechiae Bilateral mastectomy scars with extensive telangiectasia unchanged.   Lab Results:  Results for orders placed or performed in visit on 10/01/15  CBC with Differential  Result Value Ref Range   WBC 6.9 3.9 - 10.3 10e3/uL   NEUT# 4.2 1.5 - 6.5 10e3/uL   HGB 12.9 11.6 - 15.9 g/dL   HCT 40.1 34.8 - 46.6 %   Platelets 155 145 - 400 10e3/uL   MCV 89.8 79.5 - 101.0 fL   MCH 28.9 25.1 - 34.0 pg   MCHC 32.2 31.5 - 36.0 g/dL   RBC 4.46 3.70 - 5.45 10e6/uL   RDW 13.6 11.2 - 14.5 %   lymph# 2.0 0.9 - 3.3 10e3/uL   MONO# 0.6 0.1 - 0.9 10e3/uL   Eosinophils Absolute 0.1 0.0 - 0.5 10e3/uL   Basophils Absolute 0.0 0.0 - 0.1 10e3/uL   NEUT% 60.4 38.4 - 76.8 %   LYMPH% 28.7 14.0 - 49.7 %   MONO% 8.6 0.0 - 14.0 %   EOS% 1.6 0.0 - 7.0 %   BASO% 0.7 0.0 - 2.0 %  Comprehensive metabolic panel  Result Value Ref Range   Sodium 143 136 - 145 mEq/L   Potassium 4.8 3.5 - 5.1 mEq/L   Chloride 106 98 - 109 mEq/L   CO2 30 (H) 22 - 29 mEq/L   Glucose 130 70 - 140 mg/dl   BUN 13.9 7.0 - 26.0 mg/dL   Creatinine 0.7 0.6 - 1.1 mg/dL   Total Bilirubin 0.68 0.20 - 1.20 mg/dL   Alkaline Phosphatase 61 40 - 150 U/L   AST 36 (H) 5 - 34 U/L   ALT 21 0 - 55 U/L   Total Protein 6.4 6.4 - 8.3 g/dL   Albumin 3.4 (L) 3.5 - 5.0 g/dL   Calcium 8.8 8.4 - 10.4 mg/dL   Anion Gap 7 3 - 11 mEq/L   EGFR 86 (L) >90 ml/min/1.73 m2  CA 125 on 09-21-15   16.7  CA 2729 on 09-21-15  168                 07-30-15   163                   06-25-15   143  Studies/Results:  No results found.  Medications: I have reviewed the patient's current medications. Prescription for ondansetron. Reviewed dosing of Xeloda, which she is doing correctly  DISCUSSION Reviewed information re CMF chemotherapy in 1997-98.  Reviewed side effects possible with Xeloda. She knows to call if any  symptoms or concerns prior to next scheduled MD appointment (10-13-15). She is pleased with how she has done this week and in agreement with plans as noted.     Assessment/Plan:  1.Bilateral stage III ER PR negative node negative breast cancers 1997 at age 17 and premenopausal,  treated with bilateral mastectomies and axillary node dissections, adjuvant CMF chemotherapy and radiation to chest walls and supraclavicular areas. 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 On Letrozole from 04-2010 until 03-2015 changed to tamoxifen due to increasing marker, tho PET CT 11-2013 did not show imaging correlation. CT CAP  08-25-15 stable in lungs however with  new solitary liver lesion. Liver biopsy confirmed metastatic breast cancer. Cycle 1 Xeloda begun 09-25-15, planned 1500 mg bid x 14 days every 21 days.  BRCA reportedly normal. 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 CA 125 stable in normal range. She saw Dr Skeet Latch 11-13-14 and will see gyn onc annually. 3. Melanoma upper back 2013, treated with wide excision. Followed by dermatology. Some records received from Dr Allyson Sabal, which did not include actual path report; those to be scanned into this EMR. 4.would need PAC for any IV chemo due to bilateral axillary node surgery and bilateral radiation 5. Sclerotic lesion in left humeral head stable back to 02-2013 6.morbid obesity, BMI 40.8. Weight loss due to improvement in eating habits and increased exercise would be beneficial. 7.flu vaccine 03-04-15 8.recent bronchitis resolved 9.chronic orthopedic problems left foot: follows with podiatrist. 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 Arthralgias in hands better off aromatase inhibitor    All  questions answered and patient knows to call if any concerns prior to scheduled appointments. Time spent 25 min including >50% counseling, review and documentation of past treatment history,  and coordination of care. Route PCP, cc Dr Oneita Jolly, MD   10/02/2015, 8:31 PM

## 2015-10-02 ENCOUNTER — Encounter: Payer: Self-pay | Admitting: Pharmacist

## 2015-10-02 ENCOUNTER — Telehealth: Payer: Self-pay | Admitting: Pharmacist

## 2015-10-02 ENCOUNTER — Other Ambulatory Visit: Payer: Self-pay | Admitting: Oncology

## 2015-10-02 DIAGNOSIS — C50919 Malignant neoplasm of unspecified site of unspecified female breast: Secondary | ICD-10-CM | POA: Insufficient documentation

## 2015-10-02 DIAGNOSIS — Z5111 Encounter for antineoplastic chemotherapy: Secondary | ICD-10-CM | POA: Insufficient documentation

## 2015-10-02 NOTE — Telephone Encounter (Signed)
10/02/15: Attempted to reach patient for follow up on oral medication: Xeloda. No answer. Left VM for patient to call back with any questions or issues.   Thank you,  Montel Clock, PharmD, [facility] [phone]

## 2015-10-02 NOTE — Progress Notes (Signed)
Oral Chemotherapy Follow-Up Form  Original Start date of oral chemotherapy: 09/26/15   Called patient today to follow up regarding patient's oral chemotherapy medication: Xeloda  Pt is doing well today. No issues or side effects to report. No missed or extra doses. Patient comes in to See Dr. Marko Plume on 5/30  Pt reports 0 tablets/doses missed in the last week.    Pt reports the following side effects: none to report    Will follow up and call patient again in 2 weeks   Thank you,  Montel Clock, PharmD, [facility]

## 2015-10-05 ENCOUNTER — Telehealth: Payer: Self-pay

## 2015-10-05 NOTE — Telephone Encounter (Signed)
Brittany Porter reports that she is doing well so far.  She has been tired after doing activities at church this weekend. No N/V, diarrhea or rashes.  Occasionally her fached gets flushed.   Encouraged her to call if any SOB or other symptoms develop.

## 2015-10-05 NOTE — Telephone Encounter (Signed)
-----   Message from Gordy Levan, MD sent at 10/02/2015  8:47 PM EDT ----- Please check on her by phone ~ 5-23. Would get CBC before end of week if more fatigue or other problems. Began 14 day course xeloda on 09-25-15.  thanks

## 2015-10-12 ENCOUNTER — Other Ambulatory Visit: Payer: Self-pay | Admitting: Oncology

## 2015-10-12 DIAGNOSIS — C782 Secondary malignant neoplasm of pleura: Principal | ICD-10-CM

## 2015-10-12 DIAGNOSIS — C50911 Malignant neoplasm of unspecified site of right female breast: Secondary | ICD-10-CM

## 2015-10-12 DIAGNOSIS — C787 Secondary malignant neoplasm of liver and intrahepatic bile duct: Secondary | ICD-10-CM

## 2015-10-13 ENCOUNTER — Telehealth: Payer: Self-pay | Admitting: Oncology

## 2015-10-13 ENCOUNTER — Ambulatory Visit (HOSPITAL_BASED_OUTPATIENT_CLINIC_OR_DEPARTMENT_OTHER): Payer: Medicare Other | Admitting: Oncology

## 2015-10-13 ENCOUNTER — Encounter: Payer: Self-pay | Admitting: Oncology

## 2015-10-13 ENCOUNTER — Other Ambulatory Visit (HOSPITAL_BASED_OUTPATIENT_CLINIC_OR_DEPARTMENT_OTHER): Payer: Medicare Other

## 2015-10-13 ENCOUNTER — Other Ambulatory Visit: Payer: Self-pay

## 2015-10-13 VITALS — BP 147/65 | HR 80 | Temp 98.7°F | Resp 16 | Ht 63.0 in | Wt 228.8 lb

## 2015-10-13 DIAGNOSIS — C787 Secondary malignant neoplasm of liver and intrahepatic bile duct: Secondary | ICD-10-CM

## 2015-10-13 DIAGNOSIS — C50911 Malignant neoplasm of unspecified site of right female breast: Secondary | ICD-10-CM

## 2015-10-13 DIAGNOSIS — C50919 Malignant neoplasm of unspecified site of unspecified female breast: Secondary | ICD-10-CM

## 2015-10-13 DIAGNOSIS — C782 Secondary malignant neoplasm of pleura: Secondary | ICD-10-CM

## 2015-10-13 DIAGNOSIS — C50912 Malignant neoplasm of unspecified site of left female breast: Secondary | ICD-10-CM | POA: Diagnosis not present

## 2015-10-13 DIAGNOSIS — Z8543 Personal history of malignant neoplasm of ovary: Secondary | ICD-10-CM

## 2015-10-13 DIAGNOSIS — Z5111 Encounter for antineoplastic chemotherapy: Secondary | ICD-10-CM

## 2015-10-13 DIAGNOSIS — C4359 Malignant melanoma of other part of trunk: Secondary | ICD-10-CM

## 2015-10-13 DIAGNOSIS — C569 Malignant neoplasm of unspecified ovary: Secondary | ICD-10-CM

## 2015-10-13 DIAGNOSIS — Z85820 Personal history of malignant melanoma of skin: Secondary | ICD-10-CM

## 2015-10-13 LAB — COMPREHENSIVE METABOLIC PANEL
ALT: 22 U/L (ref 0–55)
AST: 45 U/L — ABNORMAL HIGH (ref 5–34)
Albumin: 3.5 g/dL (ref 3.5–5.0)
Alkaline Phosphatase: 51 U/L (ref 40–150)
Anion Gap: 8 mEq/L (ref 3–11)
BUN: 10.8 mg/dL (ref 7.0–26.0)
CO2: 31 mEq/L — ABNORMAL HIGH (ref 22–29)
Calcium: 9.4 mg/dL (ref 8.4–10.4)
Chloride: 106 mEq/L (ref 98–109)
Creatinine: 0.7 mg/dL (ref 0.6–1.1)
EGFR: 88 mL/min/{1.73_m2} — ABNORMAL LOW (ref >90)
Glucose: 77 mg/dl (ref 70–140)
Potassium: 4.7 mEq/L (ref 3.5–5.1)
Sodium: 145 mEq/L (ref 136–145)
Total Bilirubin: 0.59 mg/dL (ref 0.20–1.20)
Total Protein: 6.6 g/dL (ref 6.4–8.3)

## 2015-10-13 LAB — CBC WITH DIFFERENTIAL/PLATELET
BASO%: 0.4 % (ref 0.0–2.0)
Basophils Absolute: 0 10*3/uL (ref 0.0–0.1)
EOS%: 1.6 % (ref 0.0–7.0)
Eosinophils Absolute: 0.1 10*3/uL (ref 0.0–0.5)
HCT: 41.5 % (ref 34.8–46.6)
HGB: 13.7 g/dL (ref 11.6–15.9)
LYMPH%: 34.4 % (ref 14.0–49.7)
MCH: 30.5 pg (ref 25.1–34.0)
MCHC: 33 g/dL (ref 31.5–36.0)
MCV: 92.4 fL (ref 79.5–101.0)
MONO#: 0.6 10*3/uL (ref 0.1–0.9)
MONO%: 7.1 % (ref 0.0–14.0)
NEUT#: 4.8 10*3/uL (ref 1.5–6.5)
NEUT%: 56.5 % (ref 38.4–76.8)
Platelets: 165 10*3/uL (ref 145–400)
RBC: 4.49 10*6/uL (ref 3.70–5.45)
RDW: 14.7 % — ABNORMAL HIGH (ref 11.2–14.5)
WBC: 8.6 10*3/uL (ref 3.9–10.3)
lymph#: 3 10*3/uL (ref 0.9–3.3)

## 2015-10-13 MED ORDER — CAPECITABINE 500 MG PO TABS: 800.0000 mg/m2 | ORAL_TABLET | Freq: Two times a day (BID) | ORAL | Status: DC

## 2015-10-13 MED FILL — CAPECITABINE 500 MG TABLET: 500 | 21 days supply | Qty: 84 | Fill #0

## 2015-10-13 NOTE — Telephone Encounter (Signed)
appt made and avs printed °

## 2015-10-13 NOTE — Progress Notes (Signed)
OFFICE PROGRESS NOTE   Oct 13, 2015   Physicians: Marcy Panning), [name], [name], [place], Candace [place], (James Kinard), [facility] [facility], [place].Dahldorf/ Mina Marble of [employer]  INTERVAL HISTORY:   Patient is seen, alone for visit, in continuing attention to metastatic breast cancer, which had been well controlled in left pleural area from 04-2010 on hormonal blockers until recent progression in single area of liver. She had cycle 1 xeloda 5-13 thru 10-09-15, tolerated well.   Patient had some minimal fatigue towards end of first cycle of xeloda, but no significant nausea, no stomatitis or diarrhea, no skin irritation. She denies increased SOB, any chest pain, any other new or different pain, LE swelling, any fever or symptoms of infection or any bleeding. She is going about regular activities including volunteer work at [employer]. Remainder of 10 point Review of Systems negative/ unchanged.     No PAC Patient confirms that she had genetics testing 2010, negative BRCA. CA 2729 was 71 in 04-2010 Flu vaccine 03-04-15  ONCOLOGIC HISTORY We have obtained chemotherapy and radiation therapy information from 1997-98 as well as medical oncology note from 11-12-2008 out of [facility] EMR, all of which will be scanned into EPIC.  Bilateral stage 3 node negative, ER PR negative breast cancers treated with bilateral mastectomies and bilateral axillary node dissections by Dr Andrey Campanile 11-20- 1997 at age 70 and premenopausal. Full path report not included in this information, however information in note gives path [phone] adenocarcinoma moderately well differentiated 5.5 cm on right, no lymphatic or vascular invasion and on left 6 cm well differentiated , no lymphatic invasion, DNA index 1.09 and "ER PR receptors reported negative". She received 6 cycles of CMF chemotherapy from 05-15-96 thru 08-13-96 by Dr Tressie Stalker. She had radiation by Dr Nila Nephew 5040 cGy to bilateral chest wall  with scars to 5940 cGy and bilateral supraclavicular regions to 4140 cGy, given 10-14-96 thru 11-29-96. She had PAC for the adjuvant chemotherapy. 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 She was begun on letrozole at that time.Note CA 2729 was elevated at time of recurrent disease. She continued letrozole unitl rising CA 2729 in 03-2015, with hormonal blocker changed to tamoxifen 04-05-15. CT CAP 08-25-15 had new single liver lesion, biopsied by IR on 09-11-15, confirming metastatic breast. She began xeloda 09-25-15 at 1500 mg bid.   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 She received 6 cycles adjuvant taxol carboplatin thru Nov 2010 and vaginal cuff brachytherapy completed Jan 2011. Note CA125 was elevated at presentation. PAC used for chemotherapy.  Melanoma upper mid back treated with wide local excision 02-2012 ( that path not located in EMR)    Objective:  Vital signs in last 24 hours:  BP 147/65 mmHg  Pulse 80  Temp(Src) 98.7 F (37.1 C) (Oral)  Resp 16  Ht 5' 3" (1.6 m)  Wt 228 lb 12.8 oz (103.783 kg)  BMI 40.54 kg/m2  SpO2 98% Weight down 2 lbs Alert, oriented and appropriate. Ambulatory without assistance. Respirations not labored, looks comfortable No alopecia  HEENT:PERRL, sclerae not icteric. Oral mucosa moist without lesions, posterior pharynx clear.  Neck supple. No JVD.  Lymphatics:no cervical,supraclavicular, axillary adenopathy Resp: no wheezes, rubs, rales. Breath sounds heard to lower fields bilaterally, somewhat decreased left base. Cardio: regular rate and rhythm. No gallop. GI: abdomen obese, soft, nontender,  not distended, cannot appreciate mass or organomegaly. Normally active bowel sounds.  Musculoskeletal/ Extremities: without pitting edema, cords,  tenderness Neuro: no peripheral neuropathy. Otherwise nonfocal. PSYCH appropriate mood and affect Skin without rash, ecchymosis, petechiae   Lab Results:  Results for orders placed or performed in visit on 10/13/15  CBC with Differential  Result Value Ref Range   WBC 8.6 3.9 - 10.3 10e3/uL   NEUT# 4.8 1.5 - 6.5 10e3/uL   HGB 13.7 11.6 - 15.9 g/dL   HCT 41.5 34.8 - 46.6 %   Platelets 165 145 - 400 10e3/uL   MCV 92.4 79.5 - 101.0 fL   MCH 30.5 25.1 - 34.0 pg   MCHC 33.0 31.5 - 36.0 g/dL   RBC 4.49 3.70 - 5.45 10e6/uL   RDW 14.7 (H) 11.2 - 14.5 %   lymph# 3.0 0.9 - 3.3 10e3/uL   MONO# 0.6 0.1 - 0.9 10e3/uL   Eosinophils Absolute 0.1 0.0 - 0.5 10e3/uL   Basophils Absolute 0.0 0.0 - 0.1 10e3/uL   NEUT% 56.5 38.4 - 76.8 %   LYMPH% 34.4 14.0 - 49.7 %   MONO% 7.1 0.0 - 14.0 %   EOS% 1.6 0.0 - 7.0 %   BASO% 0.4 0.0 - 2.0 %  Comprehensive metabolic panel  Result Value Ref Range   Sodium 145 136 - 145 mEq/L   Potassium 4.7 3.5 - 5.1 mEq/L   Chloride 106 98 - 109 mEq/L   CO2 31 (H) 22 - 29 mEq/L   Glucose 77 70 - 140 mg/dl   BUN 10.8 7.0 - 26.0 mg/dL   Creatinine 0.7 0.6 - 1.1 mg/dL   Total Bilirubin 0.59 0.20 - 1.20 mg/dL   Alkaline Phosphatase 51 40 - 150 U/L   AST 45 (H) 5 - 34 U/L   ALT 22 0 - 55 U/L   Total Protein 6.6 6.4 - 8.3 g/dL   Albumin 3.5 3.5 - 5.0 g/dL   Calcium 9.4 8.4 - 10.4 mg/dL   Anion Gap 8 3 - 11 mEq/L   EGFR 88 (L) >90 ml/min/1.73 m2     Studies/Results:  No results found.  Medications: I have reviewed the patient's current medications. Xeloda prescription to [facility], 1500 mg bid x 14 days to begin 10-17-15  DISCUSSION  Patient is pleased with how well she tolerated the first xeloda and is in agreement with continuing. Cycle 2 will be 10-17-15 thru 10-30-15 and cycle 3 6-24 thru 11-20-15  I will see her with labs towards end of cycle 2; she knows to call prior if concerns.   Assessment/Plan:  1.Bilateral stage III ER PR negative node  negative breast cancers 1997 at age 70 and premenopausal, treated with bilateral mastectomies and axillary node dissections, adjuvant CMF chemotherapy and radiation to chest walls and supraclavicular areas. 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 On Letrozole from 04-2010 until 03-2015 changed to tamoxifen due to increasing marker, tho PET CT 11-2013 did not show imaging correlation. CT CAP 08-25-15 stable in lungs however with new solitary liver lesion. Liver biopsy confirmed metastatic breast cancer. Cycle 1 Xeloda begun 09-25-15, planned 1500 mg bid x 14 days every 21 days. Counts fine to begin cycle 2 on 10-17-15. BRCA reportedly normal. 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 CA 125 stable in normal range. She  saw Dr Skeet Latch 11-13-14 and will see gyn onc annually. 3. Melanoma upper back 2013, treated with wide excision. Followed by dermatology. Some records received from Dr Allyson Sabal, which did not include actual path report; those to be scanned into this EMR. 4.would need PAC for any IV chemo due to bilateral axillary node surgery and bilateral radiation 5. Sclerotic lesion in left humeral head stable back to 02-2013 6.morbid obesity, BMI 40.8. Weight loss due to improvement in eating habits and increased exercise would be beneficial. 7..chronic orthopedic problems left foot: follows with podiatrist. 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 Arthralgias in hands better off aromatase inhibitor    All questions answered. Time spent 25 min including >50% counseling and coordination of care.    LIVESAY,[name], MD   10/13/2015, 5:51 PM

## 2015-10-26 ENCOUNTER — Other Ambulatory Visit: Payer: Self-pay | Admitting: Oncology

## 2015-10-29 ENCOUNTER — Telehealth: Payer: Self-pay | Admitting: Oncology

## 2015-10-29 ENCOUNTER — Other Ambulatory Visit (HOSPITAL_BASED_OUTPATIENT_CLINIC_OR_DEPARTMENT_OTHER): Payer: Medicare Other

## 2015-10-29 ENCOUNTER — Encounter: Payer: Self-pay | Admitting: Oncology

## 2015-10-29 ENCOUNTER — Ambulatory Visit (HOSPITAL_BASED_OUTPATIENT_CLINIC_OR_DEPARTMENT_OTHER): Payer: Medicare Other | Admitting: Oncology

## 2015-10-29 VITALS — BP 150/77 | HR 92 | Temp 98.9°F | Resp 18 | Ht 63.0 in | Wt 231.8 lb

## 2015-10-29 DIAGNOSIS — C50912 Malignant neoplasm of unspecified site of left female breast: Secondary | ICD-10-CM

## 2015-10-29 DIAGNOSIS — Z6841 Body Mass Index (BMI) 40.0 and over, adult: Secondary | ICD-10-CM

## 2015-10-29 DIAGNOSIS — C782 Secondary malignant neoplasm of pleura: Secondary | ICD-10-CM | POA: Diagnosis not present

## 2015-10-29 DIAGNOSIS — C787 Secondary malignant neoplasm of liver and intrahepatic bile duct: Secondary | ICD-10-CM | POA: Diagnosis not present

## 2015-10-29 DIAGNOSIS — C569 Malignant neoplasm of unspecified ovary: Secondary | ICD-10-CM

## 2015-10-29 DIAGNOSIS — Z85820 Personal history of malignant melanoma of skin: Secondary | ICD-10-CM

## 2015-10-29 DIAGNOSIS — C55 Malignant neoplasm of uterus, part unspecified: Secondary | ICD-10-CM

## 2015-10-29 DIAGNOSIS — C50911 Malignant neoplasm of unspecified site of right female breast: Secondary | ICD-10-CM | POA: Diagnosis not present

## 2015-10-29 DIAGNOSIS — B372 Candidiasis of skin and nail: Secondary | ICD-10-CM

## 2015-10-29 DIAGNOSIS — Z8542 Personal history of malignant neoplasm of other parts of uterus: Secondary | ICD-10-CM

## 2015-10-29 DIAGNOSIS — C50919 Malignant neoplasm of unspecified site of unspecified female breast: Secondary | ICD-10-CM

## 2015-10-29 DIAGNOSIS — Z8543 Personal history of malignant neoplasm of ovary: Secondary | ICD-10-CM

## 2015-10-29 LAB — CBC WITH DIFFERENTIAL/PLATELET
BASO%: 0.5 % (ref 0.0–2.0)
Basophils Absolute: 0 10*3/uL (ref 0.0–0.1)
EOS%: 1.6 % (ref 0.0–7.0)
Eosinophils Absolute: 0.1 10*3/uL (ref 0.0–0.5)
HCT: 40.2 % (ref 34.8–46.6)
HGB: 13.4 g/dL (ref 11.6–15.9)
LYMPH%: 24.9 % (ref 14.0–49.7)
MCH: 30.7 pg (ref 25.1–34.0)
MCHC: 33.3 g/dL (ref 31.5–36.0)
MCV: 92.3 fL (ref 79.5–101.0)
MONO#: 0.6 10*3/uL (ref 0.1–0.9)
MONO%: 7.9 % (ref 0.0–14.0)
NEUT#: 5.3 10*3/uL (ref 1.5–6.5)
NEUT%: 65.1 % (ref 38.4–76.8)
Platelets: 153 10*3/uL (ref 145–400)
RBC: 4.35 10*6/uL (ref 3.70–5.45)
RDW: 16.7 % — ABNORMAL HIGH (ref 11.2–14.5)
WBC: 8.1 10*3/uL (ref 3.9–10.3)
lymph#: 2 10*3/uL (ref 0.9–3.3)

## 2015-10-29 LAB — COMPREHENSIVE METABOLIC PANEL
ALT: 20 U/L (ref 0–55)
AST: 33 U/L (ref 5–34)
Albumin: 3.6 g/dL (ref 3.5–5.0)
Alkaline Phosphatase: 54 U/L (ref 40–150)
Anion Gap: 8 mEq/L (ref 3–11)
BUN: 17.7 mg/dL (ref 7.0–26.0)
CO2: 30 mEq/L — ABNORMAL HIGH (ref 22–29)
Calcium: 10 mg/dL (ref 8.4–10.4)
Chloride: 105 mEq/L (ref 98–109)
Creatinine: 0.7 mg/dL (ref 0.6–1.1)
EGFR: 84 mL/min/{1.73_m2} — ABNORMAL LOW (ref >90)
Glucose: 99 mg/dl (ref 70–140)
Potassium: 4.9 mEq/L (ref 3.5–5.1)
Sodium: 143 mEq/L (ref 136–145)
Total Bilirubin: 1.33 mg/dL — ABNORMAL HIGH (ref 0.20–1.20)
Total Protein: 6.8 g/dL (ref 6.4–8.3)

## 2015-10-29 MED ORDER — NYSTATIN 100000 UNIT/GM EX POWD: CUTANEOUS | Status: DC

## 2015-10-29 MED ORDER — CAPECITABINE 500 MG PO TABS: 800.0000 mg/m2 | ORAL_TABLET | Freq: Two times a day (BID) | ORAL | Status: DC

## 2015-10-29 MED FILL — NYSTATIN 100,000 UNIT/GM PO: 100000 | 20 days supply | Qty: 30 | Fill #0

## 2015-10-29 MED FILL — CAPECITABINE 500 MG TABLET: 500 | 21 days supply | Qty: 84 | Fill #0

## 2015-10-29 NOTE — Progress Notes (Signed)
OFFICE PROGRESS NOTE   October 30, 2015    Physicians: Marcy Panning), [name], [name], [place], Candace [place], (James Kinard), [place] [facility], [place].Dahldorf/ Wang of [employer]  INTERVAL HISTORY:  Patient is seen, together with husband, as she continues xeloda for progressive metastatic breast cancer involving left pleura and single area in liver. She took second cycle of xeloda 6-3 thru 10-30-15, this planned x 14 days q 21 days.   She also has history of stage II endometrial ca and stage IC grade 1 ovarian ca 2010. She is to see Dr Skeet Latch 11-12-15.  Patient has tolerated xeloda well this far, with only minimal fatigue, one episode of diarrhea that may have been food related, no mucositis, no nausea. She denies increased SOB, chest pain or other new or different pain, LE swelling, any bleeding, any fever or symptoms of infection. She has not noticed hair loss. She generally avoids sun. She does have irritated skin under panus on right. SHe has no symptoms of palmar plantar dysesthesia. She has been able to go about all usual activities. Remander of 10 point Review of Systems negative/ unchanged.    No PAC Patient confirms that she had genetics testing 2010, negative BRCA. CA 2729 was 71 in 04-2010  She and husband prepared food for 100 homeless at church this am. They have been married 35 years.  ONCOLOGIC HISTORY  We have obtained chemotherapy and radiation therapy information from 1997-98 as well as medical oncology note from 11-12-2008 out of [facility] EMR, all of which will be scanned into EPIC.  Bilateral stage 3 node negative, ER PR negative breast cancers treated with bilateral mastectomies and bilateral axillary node dissections by Dr Andrey Campanile 11-20- 1997 at age 54 and premenopausal. Full path report not included in this information, however information in note gives path [phone] adenocarcinoma moderately well differentiated 5.5 cm on right, no lymphatic or  vascular invasion and on left 6 cm well differentiated , no lymphatic invasion, DNA index 1.09 and "ER PR receptors reported negative". She received 6 cycles of CMF chemotherapy from 05-15-96 thru 08-13-96 by Dr Tressie Stalker. She had radiation by Dr Nila Nephew 5040 cGy to bilateral chest wall with scars to 5940 cGy and bilateral supraclavicular regions to 4140 cGy, given 10-14-96 thru 11-29-96. She had PAC for the adjuvant chemotherapy. 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 She was begun on letrozole at that time.Note CA 2729 was elevated at time of recurrent disease. She continued letrozole from ~ 04-2010 until rising CA 2729 in 03-2015, with hormonal blocker changed to tamoxifen 04-05-15. CT CAP 08-25-15 had new single liver lesion, biopsied by IR on 09-11-15, confirming metastatic breast. Tamoxifen was [facility] and she began xeloda 09-25-15 at 1500 mg bid.   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 She received 6 cycles adjuvant taxol carboplatin thru Nov 2010 and vaginal cuff brachytherapy completed Jan 2011. Note CA125 was elevated at presentation. PAC used for chemotherapy.  Melanoma upper mid back treated with wide local excision 02-2012 ( that path not located in EMR)    Objective:  Vital signs in last 24 hours:  BP 150/77 mmHg  Pulse 92  Temp(Src) 98.9 F (37.2 C) (Oral)  Resp 18  Ht '5\' 3"'  (1.6 m)  Wt 231 lb 12.8 oz (105.144 kg)  BMI 41.07 kg/m2  SpO2 97%  Weight up 3 lbs Alert, oriented and appropriate. Ambulatory without assistance, looks comfortable in exam room.  No alopecia  HEENT:PERRL, sclerae not icteric. Oral mucosa moist without lesions, posterior pharynx clear.  Neck supple. No JVD.  Lymphatics:no supraclavicular adenopathy Resp: clear to auscultation bilaterally, diminished  BS bases Cardio: regular rate and rhythm. No gallop. GI: abdomen obese,soft, nontender, cannot appreciate mass or organomegaly. Normally active bowel sounds. Musculoskeletal/ Extremities: without pitting edema, cords, tenderness Neuro: no peripheral neuropathy. Otherwise nonfocal Skin cutaneous yeast with some superficial desquamation in deep folds right groin. Otherwise without significant rash, ecchymosis, petechiae. Fair skin, some sun changes, but no marked exacerbation with xeloda thus far.   Lab Results:  Results for orders placed or performed in visit on 10/29/15  CBC with Differential  Result Value Ref Range   WBC 8.1 3.9 - 10.3 10e3/uL   NEUT# 5.3 1.5 - 6.5 10e3/uL   HGB 13.4 11.6 - 15.9 g/dL   HCT 40.2 34.8 - 46.6 %   Platelets 153 145 - 400 10e3/uL   MCV 92.3 79.5 - 101.0 fL   MCH 30.7 25.1 - 34.0 pg   MCHC 33.3 31.5 - 36.0 g/dL   RBC 4.35 3.70 - 5.45 10e6/uL   RDW 16.7 (H) 11.2 - 14.5 %   lymph# 2.0 0.9 - 3.3 10e3/uL   MONO# 0.6 0.1 - 0.9 10e3/uL   Eosinophils Absolute 0.1 0.0 - 0.5 10e3/uL   Basophils Absolute 0.0 0.0 - 0.1 10e3/uL   NEUT% 65.1 38.4 - 76.8 %   LYMPH% 24.9 14.0 - 49.7 %   MONO% 7.9 0.0 - 14.0 %   EOS% 1.6 0.0 - 7.0 %   BASO% 0.5 0.0 - 2.0 %  Comprehensive metabolic panel  Result Value Ref Range   Sodium 143 136 - 145 mEq/L   Potassium 4.9 3.5 - 5.1 mEq/L   Chloride 105 98 - 109 mEq/L   CO2 30 (H) 22 - 29 mEq/L   Glucose 99 70 - 140 mg/dl   BUN 17.7 7.0 - 26.0 mg/dL   Creatinine 0.7 0.6 - 1.1 mg/dL   Total Bilirubin 1.33 (H) 0.20 - 1.20 mg/dL   Alkaline Phosphatase 54 40 - 150 U/L   AST 33 5 - 34 U/L   ALT 20 0 - 55 U/L   Total Protein 6.8 6.4 - 8.3 g/dL   Albumin 3.6 3.5 - 5.0 g/dL   Calcium 10.0 8.4 - 10.4 mg/dL   Anion Gap 8 3 - 11 mEq/L   EGFR 84 (L) >90 ml/min/1.73 m2  CA 27.29  Result Value Ref Range   CA 27.29 197.1 (H) 0.0 - 38.6 U/mL   CA 2729 available after visit, this having been 168 on 09-21-15 and 163 on 07-30-15.  CA 125  was 16.7 on 09-21-15, compared with 13 on 05-07-15.  Studies/Results:  No results found.  Medications: I have reviewed the patient's current medications.  DISCUSSION Clinically remains asymptomatic from biopsy proven progression of metastatic breast cancer, and is tolerating xeloda well. Will let her know ca 2729 marker as above, continue to follow and hopefully will see some improvement with further xeloda. She will begin cycle 3 ~ 11-07-15 x 14 days, and knows to call if any concerns between scheduled apppointments. She will keep appointment with Dr Skeet Latch as scheduled late June.   Assessment/Plan:  1.Bilateral stage III ER PR negative node negative breast cancers 1997 at age 70 and premenopausal, treated with bilateral mastectomies and axillary node dissections, adjuvant CMF chemotherapy  and radiation to chest walls and supraclavicular areas. 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 On Letrozole from 04-2010 until 03-2015 changed to tamoxifen due to increasing marker, tho PET CT 11-2013 did not show imaging correlation. CT CAP 08-25-15 stable in lungs however with new solitary liver lesion. Liver biopsy confirmed metastatic breast cancer. Cycle 1 Xeloda begun 09-25-15, planned 1500 mg bid x 14 days every 21 days. She will begin cycle 3 on 11-07-15. Following CA 2729, no improvement yet, plan as above. BRCA reportedly normal. 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 CA 125 essentially stable. She sees gyn onc annually. 3. Melanoma upper back 2013, treated with wide excision. Followed by dermatology. Some records received from Dr Allyson Sabal, which did not include actual path report; those to be scanned into this EMR. 4.would need PAC for any IV chemo due to bilateral axillary node surgery and  bilateral radiation 5. Sclerotic lesion in left humeral head stable back to 02-2013 6.morbid obesity, BMI 40.8. Weight loss due to improvement in eating habits and increased exercise would be beneficial. 7..chronic orthopedic problems left foot: follows with podiatrist. 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 Arthralgias in hands better off aromatase inhibitor  9.cutaneous yeast: discussed careful air drying after bathing and will send script for mycostatin powder to use 2-3x daily  All questions answered and patient/ husband are in agreement with plans. Xeloda prescription to [name]. Time spent 25 min including >50% counseling and coordination of care. Route PCP, in EPIC for gyn onc    LIVESAY,[name], MD   10/30/2015, 4:36 PM

## 2015-10-29 NOTE — Telephone Encounter (Signed)
appt made and avs printed °

## 2015-10-30 ENCOUNTER — Telehealth: Payer: Self-pay | Admitting: Pharmacist

## 2015-10-30 ENCOUNTER — Telehealth: Payer: Self-pay

## 2015-10-30 LAB — CANCER ANTIGEN 27.29: CA 27.29: 197.1 U/mL — ABNORMAL HIGH (ref 0.0–38.6)

## 2015-10-30 NOTE — Telephone Encounter (Signed)
Oral Chemotherapy Follow-Up Form  Original Start date of oral chemotherapy: 09-26-15  Called patient today to follow up regarding patient's oral chemotherapy medication: Xeloda  Pt is doing well today, just finished 14 days "on" Xeloda, and is currently on her "off" week. She reports not having any side effects from Xeloda and has not had any issues getting her refills.  Pt reports 0 tablets/doses missed in the last month.   Pt reports the following side effects: none  Patient provided with phone number of oral chemotherapy clinic if she has questions in the meantime.  Will follow up and call patient again in 4 weeks.  Thank you,  Skip Mayer, PharmD, BCPS Oral Chemotherapy Clinic

## 2015-10-30 NOTE — Telephone Encounter (Signed)
Told Ms. Hinks the results of CA27.29 as noted below by Dr. Marko Plume.

## 2015-10-30 NOTE — Telephone Encounter (Signed)
-----   Message from Gordy Levan, MD sent at 10/30/2015  7:15 AM EDT ----- Labs seen and need follow up: please let her know no improvement in marker yet after first xeloda - hopefully will start to decrease as she goes further with this chemo

## 2015-11-11 NOTE — Progress Notes (Signed)
GYN ONCOLOGY OFFICE VISIT   Brittany Porter 70 y.o.Marland Kitchen female   CC:  Endometrial cancer surveillance, ovarian cancer surveillance.   Assessment/Plan:  This is a 70 y.o.  who underwent staging for a stage II endometrial cancer and stage I ovarian cancer in June of 2010. The patient completed 6 cycles of Taxol and carboplatin therapy in November of 2010. She completed vaginal cuff brachytherapy in January 2011. She's been without any evidence of disease since. Her admission for a partial small bowel obstruction 2013  prompted a hernia repair on 05/2012.  No evidence of disease since 05/2009  F/U in 6 months   Recurrent breast cancer continues xeloda for progressive metastatic breast cancer involving left pleura and single area in liver  HPI:  Endometrial and ovarian cancer surveillance.  This is a 70 y.o. . who on May 16, 2008, underwent operative staging of presumed endometrial adenocarcinoma. Intraoperative findings were also notable for an endometrioid cancer of the right ovary. The procedure that performed at  that time was a total abdominal hysterectomy, bilateral salpingo- oophorectomy, omentectomy, appendectomy, and umbilical hernia repair. 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 She subsequently received 6 cycles of Taxol and carboplatin therapy. Last cycle administered in November 2010. She underwent vaginal cuff brachytherapy for stage II endometrial cancer. She has completed in January 2011. Recurrence of her breast cancer was identified earlier this year and she is on letrozole therapy under the care of Dr. Marko Plume  SBO 2013 was managed conservatively. She underwent Lsc hernia repair 05/2012 by Dr. Dalbert Batman. Pap test in 04/2014 was within normal limits   Continues xeloda for progressive metastatic breast cancer involving left pleura and single area in liver  Doing well without complaints.  Past Medical History   Diagnosis Date  . Pleural effusion 04/2010  . Breast cancer ([place])   . Ovarian cancer ([place])   . Endometrial carcinoma ([place]) 10/2008  . Iron deficiency   . Diabetes mellitus   . Hyperlipidemia   . Fluid overload   . Arthritis   . History of radiation therapy 04/13/2009, 04/16/2009, 04/27/2009, 05/07/2009, 05/18/2009    3000 cGy to proximal vagina   Past Surgical History  Procedure Laterality Date  . Abdominal hysterectomy  10/2008  . Melanoma removal  02/2009, 07/2009  . Knee arthroscopy  04/2010    Right knee  . Mastectomy  1997    Bilateral with lymph nodes  . Ventral hernia repair  05/28/2012    Procedure: LAPAROSCOPIC VENTRAL HERNIA;  Surgeon: Adin Hector, MD;  Location: [place];  Service: General;  Laterality: N/A;  . Insertion of mesh  05/28/2012    Procedure: INSERTION OF MESH;  Surgeon: Adin Hector, MD;  Location: [place];  Service: General;  Laterality: N/A;  . Laparoscopic lysis of adhesions  05/28/2012    Procedure: LAPAROSCOPIC LYSIS OF ADHESIONS;  Surgeon: Adin Hector, MD;  Location: [facility] [place];  Service: General;  Laterality: N/A;    Family Hx:  Family History       Problem    Relation  Age of Onset   .  Stroke   Mother    .  Cancer   Father    .  Colon cancer   Sister    .  Stroke   Sister    .  Cancer   Sister      breast     .  Heart attack   Brother    .  Cancer   Brother      skin     .  Skin cancer   Brother    .  Cancer   Sister      colon     Social history: Her 3 eldest grand children are in college her eldest grandson turns 95 this weekend.  Her son is currently unemployed and return back to her home with his family  Review of Systems:  Constitutional  Feels well  Pulmonary  No cough or wheeze.  Gastro Intestinal  No nausea, vomitting, or diarrhoea. No bright red blood per rectum, no abdominal pain, change in bowel movement, or constipation.  Genito Urinary  No frequency, reports urgency and incontinence, dysuria,  Musculo Skeletal  No  myalgia, reports worsening arthralgia and joint pain. Neurologic  No weakness, numbness, change in gait,  Psychology  No depression, anxiety, insomnia.   Vitals: BP 165/67 mmHg  Pulse 85  Temp(Src) 98.6 F (37 C) (Oral)  Resp 18  Ht '5\' 3"'$  (1.6 m)  Wt 231 lb 9.6 oz (105.053 kg)  BMI 41.04 kg/m2  SpO2 98%  Physical Exam:  WD female in no acute distress  Neck  Supple without any enlargements.  Lymph node survey.  No cervical supraclavicular cervical or inguinal adenopathy  Cardiovascular  Pulse normal rate, regularity and rhythm.   Chest: Clear to auscultation bilateraly Psychiatry  Alert and oriented to person, place, and time  Back  No CVA tenderness  Abdomen  Lower bowel sounds, abdomen soft, non-tender and obese. Surgical sites intact without evidence of hernia. No evidence of a fluid wave or abdominal masses  Genito Urinary  Vulva/vagina: Normal external female genitalia.   Bladder/urethra: No lesions or masses  Vagina: Atrophic without any lesions.grade 1 rectocele, atrophic foreshortened vagina  No discharge or bleeding.  No cul de sac masses Rectal  Good tone, no masses no cul de sac nodularity.  Extremities  No bilateral cyanosis, clubbing, Bilateral 2+ edema of the lower extremities.

## 2015-11-12 ENCOUNTER — Encounter: Payer: Self-pay | Admitting: Gynecologic Oncology

## 2015-11-12 ENCOUNTER — Ambulatory Visit: Payer: Medicare Other | Attending: Gynecologic Oncology | Admitting: Gynecologic Oncology

## 2015-11-12 VITALS — BP 165/67 | HR 85 | Temp 98.6°F | Resp 18 | Ht 63.0 in | Wt 231.6 lb

## 2015-11-12 DIAGNOSIS — E785 Hyperlipidemia, unspecified: Secondary | ICD-10-CM | POA: Diagnosis not present

## 2015-11-12 DIAGNOSIS — Z823 Family history of stroke: Secondary | ICD-10-CM | POA: Diagnosis not present

## 2015-11-12 DIAGNOSIS — Z90710 Acquired absence of both cervix and uterus: Secondary | ICD-10-CM | POA: Insufficient documentation

## 2015-11-12 DIAGNOSIS — K5660 Unspecified intestinal obstruction: Secondary | ICD-10-CM | POA: Insufficient documentation

## 2015-11-12 DIAGNOSIS — M1990 Unspecified osteoarthritis, unspecified site: Secondary | ICD-10-CM | POA: Diagnosis not present

## 2015-11-12 DIAGNOSIS — Z8249 Family history of ischemic heart disease and other diseases of the circulatory system: Secondary | ICD-10-CM | POA: Insufficient documentation

## 2015-11-12 DIAGNOSIS — Z85820 Personal history of malignant melanoma of skin: Secondary | ICD-10-CM | POA: Diagnosis not present

## 2015-11-12 DIAGNOSIS — C561 Malignant neoplasm of right ovary: Secondary | ICD-10-CM | POA: Insufficient documentation

## 2015-11-12 DIAGNOSIS — Z9221 Personal history of antineoplastic chemotherapy: Secondary | ICD-10-CM

## 2015-11-12 DIAGNOSIS — E8770 Fluid overload, unspecified: Secondary | ICD-10-CM | POA: Diagnosis not present

## 2015-11-12 DIAGNOSIS — C50919 Malignant neoplasm of unspecified site of unspecified female breast: Secondary | ICD-10-CM

## 2015-11-12 DIAGNOSIS — E669 Obesity, unspecified: Secondary | ICD-10-CM | POA: Diagnosis not present

## 2015-11-12 DIAGNOSIS — J90 Pleural effusion, not elsewhere classified: Secondary | ICD-10-CM | POA: Diagnosis not present

## 2015-11-12 DIAGNOSIS — Z923 Personal history of irradiation: Secondary | ICD-10-CM | POA: Insufficient documentation

## 2015-11-12 DIAGNOSIS — C541 Malignant neoplasm of endometrium: Secondary | ICD-10-CM | POA: Insufficient documentation

## 2015-11-12 DIAGNOSIS — Z803 Family history of malignant neoplasm of breast: Secondary | ICD-10-CM | POA: Insufficient documentation

## 2015-11-12 DIAGNOSIS — C569 Malignant neoplasm of unspecified ovary: Secondary | ICD-10-CM

## 2015-11-12 DIAGNOSIS — Z800 Family history of malignant neoplasm of digestive organs: Secondary | ICD-10-CM | POA: Diagnosis not present

## 2015-11-12 DIAGNOSIS — D509 Iron deficiency anemia, unspecified: Secondary | ICD-10-CM | POA: Insufficient documentation

## 2015-11-12 DIAGNOSIS — E119 Type 2 diabetes mellitus without complications: Secondary | ICD-10-CM | POA: Insufficient documentation

## 2015-11-12 DIAGNOSIS — C50912 Malignant neoplasm of unspecified site of left female breast: Secondary | ICD-10-CM | POA: Insufficient documentation

## 2015-11-12 DIAGNOSIS — Z9013 Acquired absence of bilateral breasts and nipples: Secondary | ICD-10-CM | POA: Diagnosis not present

## 2015-11-12 DIAGNOSIS — Z90722 Acquired absence of ovaries, bilateral: Secondary | ICD-10-CM | POA: Diagnosis not present

## 2015-11-12 DIAGNOSIS — Z808 Family history of malignant neoplasm of other organs or systems: Secondary | ICD-10-CM | POA: Diagnosis not present

## 2015-11-12 DIAGNOSIS — Z8542 Personal history of malignant neoplasm of other parts of uterus: Secondary | ICD-10-CM

## 2015-11-12 DIAGNOSIS — Z8543 Personal history of malignant neoplasm of ovary: Secondary | ICD-10-CM

## 2015-11-12 DIAGNOSIS — Z6841 Body Mass Index (BMI) 40.0 and over, adult: Secondary | ICD-10-CM | POA: Insufficient documentation

## 2015-11-12 NOTE — Patient Instructions (Signed)
Follow up in 6 months call in October to schedule a follow up appointment with Dr [name]. Please call with any changes , questions or concerns.  Thank you!

## 2015-11-24 ENCOUNTER — Telehealth: Payer: Self-pay

## 2015-11-24 DIAGNOSIS — C50911 Malignant neoplasm of unspecified site of right female breast: Secondary | ICD-10-CM

## 2015-11-24 DIAGNOSIS — C50912 Malignant neoplasm of unspecified site of left female breast: Principal | ICD-10-CM

## 2015-11-24 NOTE — Telephone Encounter (Signed)
Ms Beavers is to have labs on 11-26-15 at 1200.  She is due to begin Cycle 4 Xeloda  on 11-27-15.  She sees Dr. Marko Plume on 11-30-15.  Can the Xeloda be refilled or wait until lab results 7-13 and or visit on 11-30-15?

## 2015-11-25 MED ORDER — CAPECITABINE 500 MG PO TABS: 800.0000 mg/m2 | ORAL_TABLET | Freq: Two times a day (BID) | ORAL | Status: DC

## 2015-11-25 MED FILL — CAPECITABINE 500 MG TABLET: 500 | 21 days supply | Qty: 84 | Fill #0

## 2015-11-25 NOTE — Telephone Encounter (Signed)
Told Ms Brittany Porter that refill was sent to [facility]. She is not to begin Xeloda on Friday until she hears from Dr. Arlyss Queen office about labs done tomorrow, Told her to call the office Friday am if she has not heard about lab results.  Ms. Toppins verbalized understanding.

## 2015-11-25 NOTE — Telephone Encounter (Signed)
Patient Demographics     Patient Name Sex DOB SSN Address Phone    Brittany Porter, Brittany Porter Female 1946/05/05 [id number] [place] [address] [phone] (Home) *Preferred* [phone] (Mobile)      Message  Received: Norman Herrlich, MD  Baruch Merl, RN; Janace Hoard, RN           Ok to refill xeloda  Need to be sure to see labs to confirm start date shortly prior to my visit  Thank you

## 2015-11-26 ENCOUNTER — Other Ambulatory Visit (HOSPITAL_BASED_OUTPATIENT_CLINIC_OR_DEPARTMENT_OTHER): Payer: Medicare Other

## 2015-11-26 ENCOUNTER — Telehealth: Payer: Self-pay

## 2015-11-26 DIAGNOSIS — C569 Malignant neoplasm of unspecified ovary: Secondary | ICD-10-CM

## 2015-11-26 DIAGNOSIS — C50919 Malignant neoplasm of unspecified site of unspecified female breast: Secondary | ICD-10-CM

## 2015-11-26 LAB — CBC WITH DIFFERENTIAL/PLATELET
BASO%: 0.6 % (ref 0.0–2.0)
Basophils Absolute: 0 10*3/uL (ref 0.0–0.1)
EOS%: 1.5 % (ref 0.0–7.0)
Eosinophils Absolute: 0.1 10*3/uL (ref 0.0–0.5)
HCT: 39.6 % (ref 34.8–46.6)
HGB: 13.1 g/dL (ref 11.6–15.9)
LYMPH%: 24.1 % (ref 14.0–49.7)
MCH: 32.1 pg (ref 25.1–34.0)
MCHC: 33.1 g/dL (ref 31.5–36.0)
MCV: 97.1 fL (ref 79.5–101.0)
MONO#: 0.6 10*3/uL (ref 0.1–0.9)
MONO%: 8.6 % (ref 0.0–14.0)
NEUT#: 4.8 10*3/uL (ref 1.5–6.5)
NEUT%: 65.2 % (ref 38.4–76.8)
Platelets: 141 10*3/uL — ABNORMAL LOW (ref 145–400)
RBC: 4.08 10*6/uL (ref 3.70–5.45)
RDW: 21.4 % — ABNORMAL HIGH (ref 11.2–14.5)
WBC: 7.4 10*3/uL (ref 3.9–10.3)
lymph#: 1.8 10*3/uL (ref 0.9–3.3)

## 2015-11-26 LAB — COMPREHENSIVE METABOLIC PANEL
ALT: 18 U/L (ref 0–55)
AST: 27 U/L (ref 5–34)
Albumin: 3.5 g/dL (ref 3.5–5.0)
Alkaline Phosphatase: 72 U/L (ref 40–150)
Anion Gap: 11 mEq/L (ref 3–11)
BUN: 16.8 mg/dL (ref 7.0–26.0)
CO2: 28 mEq/L (ref 22–29)
Calcium: 9.5 mg/dL (ref 8.4–10.4)
Chloride: 105 mEq/L (ref 98–109)
Creatinine: 0.9 mg/dL (ref 0.6–1.1)
EGFR: 68 mL/min/{1.73_m2} — ABNORMAL LOW (ref >90)
Glucose: 183 mg/dl — ABNORMAL HIGH (ref 70–140)
Potassium: 4.8 mEq/L (ref 3.5–5.1)
Sodium: 144 mEq/L (ref 136–145)
Total Bilirubin: 0.99 mg/dL (ref 0.20–1.20)
Total Protein: 6.5 g/dL (ref 6.4–8.3)

## 2015-11-26 NOTE — Telephone Encounter (Signed)
S/w pt her labs are fine and she can start xeloda per Dr Marko Plume

## 2015-11-26 NOTE — Telephone Encounter (Signed)
-----   Message from Gordy Levan, MD sent at 11/24/2015  9:04 PM EDT ----- Ok to refill xeloda Need to be sure to see labs to confirm start date shortly prior to my visit Thank you

## 2015-11-29 ENCOUNTER — Other Ambulatory Visit: Payer: Self-pay | Admitting: Oncology

## 2015-11-30 ENCOUNTER — Telehealth: Payer: Self-pay | Admitting: Oncology

## 2015-11-30 ENCOUNTER — Encounter: Payer: Self-pay | Admitting: Oncology

## 2015-11-30 ENCOUNTER — Ambulatory Visit (HOSPITAL_BASED_OUTPATIENT_CLINIC_OR_DEPARTMENT_OTHER): Payer: Medicare Other | Admitting: Oncology

## 2015-11-30 VITALS — BP 149/58 | HR 82 | Temp 98.6°F | Resp 18 | Ht 63.0 in | Wt 235.1 lb

## 2015-11-30 DIAGNOSIS — C787 Secondary malignant neoplasm of liver and intrahepatic bile duct: Secondary | ICD-10-CM

## 2015-11-30 DIAGNOSIS — Z85820 Personal history of malignant melanoma of skin: Secondary | ICD-10-CM

## 2015-11-30 DIAGNOSIS — Z8543 Personal history of malignant neoplasm of ovary: Secondary | ICD-10-CM

## 2015-11-30 DIAGNOSIS — C782 Secondary malignant neoplasm of pleura: Secondary | ICD-10-CM

## 2015-11-30 DIAGNOSIS — Z8542 Personal history of malignant neoplasm of other parts of uterus: Secondary | ICD-10-CM

## 2015-11-30 DIAGNOSIS — M19079 Primary osteoarthritis, unspecified ankle and foot: Secondary | ICD-10-CM

## 2015-11-30 DIAGNOSIS — Z6841 Body Mass Index (BMI) 40.0 and over, adult: Secondary | ICD-10-CM

## 2015-11-30 DIAGNOSIS — Z5111 Encounter for antineoplastic chemotherapy: Secondary | ICD-10-CM

## 2015-11-30 DIAGNOSIS — C50919 Malignant neoplasm of unspecified site of unspecified female breast: Secondary | ICD-10-CM

## 2015-11-30 MED ORDER — HYDROCODONE-ACETAMINOPHEN 5-325 MG PO TABS: 1.0000 | ORAL_TABLET | Freq: Two times a day (BID) | ORAL | Status: DC | PRN

## 2015-11-30 NOTE — Telephone Encounter (Signed)
Gave patient avs report and appointments for august.

## 2015-11-30 NOTE — Progress Notes (Signed)
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 She began second cycle of xeloda on 11-27-15, this x 14 days every 21 days.   Patient continues to tolerate xeloda well overall, only noticing some fatigue late in day. She has had no nausea and has needed no antiemetics. Stools are a little loose occasionally, including once in past 3 days, but no ongoing diarrhea, and no stomatitis/ esophagitis. Appetite is good, some SOB with exertion not new, no cough or chest pain, no abdominal or pelvic pain, slight chronic swelling LLE stable and none on right, no bleeding. She is going about regular activities.  Remainder of 10 point Review of Systems negative.    No PAC Patient confirms that she had genetics testing 2010, negative BRCA. CA 2729 was 71 in 04-2010  ONCOLOGIC HISTORY Chemotherapy and radiation therapy information from 1997-98 as well as medical oncology note from 11-12-2008 out of [place] EMR scanned into EPIC.  Bilateral stage 3 node negative, ER PR negative breast cancers treated with bilateral mastectomies and bilateral axillary node dissections by Dr Andrey Campanile 11-20- 1997 at age 6 and premenopausal. Full path report not included in this information, however information in note gives path [phone] adenocarcinoma moderately well differentiated 5.5 cm on right, no lymphatic or vascular invasion and on left 6 cm well differentiated , no lymphatic invasion, DNA index 1.09 and "ER PR receptors reported negative". She received 6 cycles of CMF chemotherapy from 05-15-96 thru 08-13-96 by Dr Tressie Stalker. She had radiation by Dr Nila Nephew 5040 cGy to bilateral chest wall with scars to  5940 cGy and bilateral supraclavicular regions to 4140 cGy, given 10-14-96 thru 11-29-96. She had PAC for the adjuvant chemotherapy. 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 She was begun on letrozole at that time.Note CA 2729 was elevated at time of recurrent disease. She continued letrozole from ~ 04-2010 until rising CA 2729 in 03-2015, with hormonal blocker changed to tamoxifen 04-05-15. CT CAP 08-25-15 had new single liver lesion, biopsied by IR on 09-11-15, confirming metastatic breast. Tamoxifen was [facility] and she began xeloda 09-25-15 at 1500 mg bid.   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 She received 6 cycles adjuvant taxol carboplatin thru Nov 2010 and vaginal cuff brachytherapy completed Jan 2011. Note CA125 was elevated at presentation. PAC used for chemotherapy.  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 Obese Alert, oriented and appropriate. Ambulatory with some effort, appears comfortable seated in exam room. Respirations not labored RA  No alopecia  HEENT:PERRL, sclerae not icteric. Oral mucosa moist without lesions, posterior pharynx clear.  Neck supple. No JVD.  Lymphatics:no cervical,supraclavicular, axillary  adenopathy Resp: clear to auscultation bilaterally  Cardio: regular rate and rhythm.  No gallop. GI: abdomen obese, soft, nontender, not distended, cannot appreciate mass or organomegaly. Normally active bowel sounds.  Musculoskeletal/ Extremities: trace pedal edema left lower leg otherwise without pitting  edema, cords, tenderness Neuro: no peripheral neuropathy. Otherwise nonfocal Skin without rash, ecchymosis, petechiae Breasts: bilateral mastectomies   Lab Results:  Results for orders placed or performed in visit on 11/26/15  CBC with Differential  Result Value Ref Range   WBC 7.4 3.9 - 10.3 10e3/uL   NEUT# 4.8 1.5 - 6.5 10e3/uL   HGB 13.1 11.6 - 15.9 g/dL   HCT 39.6 34.8 - 46.6 %   Platelets 141 (L) 145 - 400 10e3/uL   MCV 97.1 79.5 - 101.0 fL   MCH 32.1 25.1 - 34.0 pg   MCHC 33.1 31.5 - 36.0 g/dL   RBC 4.08 3.70 - 5.45 10e6/uL   RDW 21.4 (H) 11.2 - 14.5 %   lymph# 1.8 0.9 - 3.3 10e3/uL   MONO# 0.6 0.1 - 0.9 10e3/uL   Eosinophils Absolute 0.1 0.0 - 0.5 10e3/uL   Basophils Absolute 0.0 0.0 - 0.1 10e3/uL   NEUT% 65.2 38.4 - 76.8 %   LYMPH% 24.1 14.0 - 49.7 %   MONO% 8.6 0.0 - 14.0 %   EOS% 1.5 0.0 - 7.0 %   BASO% 0.6 0.0 - 2.0 %  Comprehensive metabolic panel  Result Value Ref Range   Sodium 144 136 - 145 mEq/L   Potassium 4.8 3.5 - 5.1 mEq/L   Chloride 105 98 - 109 mEq/L   CO2 28 22 - 29 mEq/L   Glucose 183 (H) 70 - 140 mg/dl   BUN 16.8 7.0 - 26.0 mg/dL   Creatinine 0.9 0.6 - 1.1 mg/dL   Total Bilirubin 0.99 0.20 - 1.20 mg/dL   Alkaline Phosphatase 72 40 - 150 U/L   AST 27 5 - 34 U/L   ALT 18 0 - 55 U/L   Total Protein 6.5 6.4 - 8.3 g/dL   Albumin 3.5 3.5 - 5.0 g/dL   Calcium 9.5 8.4 - 10.4 mg/dL   Anion Gap 11 3 - 11 mEq/L   EGFR 68 (L) >90 ml/min/1.73 m2    Will repeat CA 2729 with next labs  Studies/Results:  No results found.  Last CT CAP 08-25-15, xeloda begun 09-25-15  Medications: I have reviewed the patient's current medications. She uses occasional hydrocodone at hs, last filled for #20 ~ 3 months ago.   DISCUSSION She is tolerating xeloda well and in agreement with continuing. Will repeat marker with next labs, timing of next scans to be decided based on marker: if clear improvement in marker will follow prior to repeating scans, otherwise will  repeat scans likely after next cycle.   She prefers appointments Thursdays late morning if possible  Assessment/Plan: 1.Bilateral stage III ER PR negative node negative breast cancers 1997 at age 70 and premenopausal, treated with bilateral mastectomies and axillary node dissections, adjuvant CMF chemotherapy and radiation to chest walls and supraclavicular areas. 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 On Letrozole from 04-2010 until 03-2015 changed to tamoxifen due to increasing marker, tho PET CT 11-2013 did not show imaging correlation. CT CAP 08-25-15 stable in lungs however with new solitary liver lesion. Liver biopsy confirmed metastatic breast cancer. Cycle 1 Xeloda begun 09-25-15, planned 1500 mg bid x 14 days every 21 days. Cycle 4 began 7-14, thru 12-17-15.. Following CA 2729. I will see her back with labs  on 8-10, just prior to start of cycle 5.   BRCA reportedly normal. 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 CA 125 essentially stable. She sees gyn onc annually. 3. Melanoma upper back 2013, treated with wide excision. Followed by dermatology. Some records received from Dr Allyson Sabal, which did not include actual path report; those to be scanned into this EMR. 4.would need PAC for any IV chemo due to bilateral axillary node surgery and bilateral radiation 5. Sclerotic lesion in left humeral head stable back to 02-2013 6.morbid obesity, BMI 40.8. Weight loss due to improvement in eating habits and increased exercise would be beneficial. 7..chronic orthopedic problems left foot: follows with podiatrist. 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 Arthralgias in hands better off aromatase inhibitor  9.cutaneous yeast   All  questions answered. Time spent 25 min including >50% counseling and coordination of care. Route PCP.    Evlyn Clines, MD   11/30/2015, 8:52 AM

## 2015-12-15 ENCOUNTER — Other Ambulatory Visit: Payer: Self-pay | Admitting: Oncology

## 2015-12-15 DIAGNOSIS — C50911 Malignant neoplasm of unspecified site of right female breast: Secondary | ICD-10-CM

## 2015-12-15 DIAGNOSIS — C50912 Malignant neoplasm of unspecified site of left female breast: Principal | ICD-10-CM

## 2015-12-17 ENCOUNTER — Ambulatory Visit: Payer: Medicare Other | Admitting: Oncology

## 2015-12-17 ENCOUNTER — Other Ambulatory Visit: Payer: Medicare Other

## 2015-12-17 MED FILL — CAPECITABINE 500 MG TABLET: 500 | 21 days supply | Qty: 84 | Fill #0

## 2015-12-22 ENCOUNTER — Other Ambulatory Visit: Payer: Self-pay | Admitting: Oncology

## 2015-12-24 ENCOUNTER — Telehealth: Payer: Self-pay | Admitting: Oncology

## 2015-12-24 ENCOUNTER — Encounter: Payer: Self-pay | Admitting: Oncology

## 2015-12-24 ENCOUNTER — Other Ambulatory Visit (HOSPITAL_BASED_OUTPATIENT_CLINIC_OR_DEPARTMENT_OTHER): Payer: Medicare Other

## 2015-12-24 ENCOUNTER — Ambulatory Visit (HOSPITAL_BASED_OUTPATIENT_CLINIC_OR_DEPARTMENT_OTHER): Payer: Medicare Other | Admitting: Oncology

## 2015-12-24 VITALS — BP 143/73 | HR 84 | Temp 98.3°F | Resp 20 | Ht 63.0 in | Wt 232.5 lb

## 2015-12-24 DIAGNOSIS — C50919 Malignant neoplasm of unspecified site of unspecified female breast: Secondary | ICD-10-CM

## 2015-12-24 DIAGNOSIS — C787 Secondary malignant neoplasm of liver and intrahepatic bile duct: Secondary | ICD-10-CM

## 2015-12-24 DIAGNOSIS — Z8543 Personal history of malignant neoplasm of ovary: Secondary | ICD-10-CM

## 2015-12-24 DIAGNOSIS — Z8542 Personal history of malignant neoplasm of other parts of uterus: Secondary | ICD-10-CM

## 2015-12-24 DIAGNOSIS — Z5111 Encounter for antineoplastic chemotherapy: Secondary | ICD-10-CM

## 2015-12-24 DIAGNOSIS — Z85820 Personal history of malignant melanoma of skin: Secondary | ICD-10-CM

## 2015-12-24 DIAGNOSIS — C782 Secondary malignant neoplasm of pleura: Secondary | ICD-10-CM | POA: Diagnosis not present

## 2015-12-24 LAB — COMPREHENSIVE METABOLIC PANEL
ALT: 20 U/L (ref 0–55)
AST: 32 U/L (ref 5–34)
Albumin: 3.6 g/dL (ref 3.5–5.0)
Alkaline Phosphatase: 61 U/L (ref 40–150)
Anion Gap: 10 mEq/L (ref 3–11)
BUN: 18.1 mg/dL (ref 7.0–26.0)
CO2: 29 mEq/L (ref 22–29)
Calcium: 9.4 mg/dL (ref 8.4–10.4)
Chloride: 105 mEq/L (ref 98–109)
Creatinine: 0.8 mg/dL (ref 0.6–1.1)
EGFR: 73 mL/min/{1.73_m2} — ABNORMAL LOW (ref >90)
Glucose: 194 mg/dl — ABNORMAL HIGH (ref 70–140)
Potassium: 4.5 mEq/L (ref 3.5–5.1)
Sodium: 144 mEq/L (ref 136–145)
Total Bilirubin: 1.69 mg/dL — ABNORMAL HIGH (ref 0.20–1.20)
Total Protein: 6.6 g/dL (ref 6.4–8.3)

## 2015-12-24 LAB — CBC WITH DIFFERENTIAL/PLATELET
BASO%: 0.4 % (ref 0.0–2.0)
Basophils Absolute: 0 10*3/uL (ref 0.0–0.1)
EOS%: 1.4 % (ref 0.0–7.0)
Eosinophils Absolute: 0.1 10*3/uL (ref 0.0–0.5)
HCT: 38.8 % (ref 34.8–46.6)
HGB: 13 g/dL (ref 11.6–15.9)
LYMPH%: 19.1 % (ref 14.0–49.7)
MCH: 33 pg (ref 25.1–34.0)
MCHC: 33.5 g/dL (ref 31.5–36.0)
MCV: 98.5 fL (ref 79.5–101.0)
MONO#: 0.6 10*3/uL (ref 0.1–0.9)
MONO%: 8 % (ref 0.0–14.0)
NEUT#: 5.5 10*3/uL (ref 1.5–6.5)
NEUT%: 71.1 % (ref 38.4–76.8)
Platelets: 138 10*3/uL — ABNORMAL LOW (ref 145–400)
RBC: 3.94 10*6/uL (ref 3.70–5.45)
RDW: 16.9 % — ABNORMAL HIGH (ref 11.2–14.5)
WBC: 7.7 10*3/uL (ref 3.9–10.3)
lymph#: 1.5 10*3/uL (ref 0.9–3.3)

## 2015-12-24 NOTE — Telephone Encounter (Signed)
appt made and avs printed. Central radiology to schedule CT scan. Contrast given to pt

## 2015-12-24 NOTE — Progress Notes (Signed)
OFFICE PROGRESS NOTE   December 25, 2015   Physicians: Marcy Panning), [name], [name], [place], Candace [place], [place] Kin[place]), [facility], [place].[employer]  INTERVAL HISTORY:  Patient is seen , alone for visit, continuing xeloda for metastatic breast carcinoma involving left pleura and more recently single area in liver. She began xeloda on 09-25-15, with present cycle of xeloda starting 12-19-15. Xeloda is dosed 1500 mg bid  x 14 days every 21 days.  She has appointment with Dr Tamala Julian 01-06-16.  Patient generally is tolerating the xeloda without difficulty, with very little diarrhea just occasionally and no nausea. She is fatigued at times, tho this is not clearly worse than prior to the xeloda, and not really worse by completion of 14 day course. She denies mucositis, increased SOB or cough, chest pain, abdominal or pelvic pain, or palmar plantar symptoms. She has stiffness in hips and knees especially in AMs, then improves. No HA, no increased swelling LE, no bleeding.  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    No PAC Patient confirms that she had genetics testing 2010, negative BRCA. CA 2729 was 71 in 04-2010    ONCOLOGIC HISTORY Chemotherapy and radiation therapy information from 1997-98 as well as medical oncology note from 11-12-2008 out of [place] EMR scanned into EPIC.  Bilateral stage 3 node negative, ER PR negative breast cancers treated with bilateral mastectomies and bilateral axillary node dissections by Dr Andrey Campanile 11-20- 1997 at age 72 and premenopausal. Full path report not included in this information, however information in note gives path [phone] adenocarcinoma moderately well differentiated 5.5 cm on right, no lymphatic or vascular invasion and on left 6 cm well differentiated , no lymphatic invasion, DNA index 1.09 and "ER PR  receptors reported negative". She received 6 cycles of CMF chemotherapy from 05-15-96 thru 08-13-96 by Dr Tressie Stalker. She had radiation by Dr Nila Nephew 5040 cGy to bilateral chest wall with scars to 5940 cGy and bilateral supraclavicular regions to 4140 cGy, given 10-14-96 thru 11-29-96. She had PAC for the adjuvant chemotherapy. 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 She was begun on letrozole at that time.Note CA 2729 was elevated at time of recurrent disease. She continued letrozole from ~ 04-2010 until rising CA 2729 in 03-2015, with hormonal blocker changed to tamoxifen 04-05-15. CT CAP 08-25-15 had new single liver lesion, biopsied by IR on 09-11-15, confirming metastatic breast. Tamoxifen was [facility] and she began xeloda 09-25-15 at 1500 mg bid x 14 days every 21 days.   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 She received 6 cycles adjuvant taxol carboplatin thru Nov 2010 and vaginal cuff brachytherapy completed Jan 2011. Note CA125 was elevated at presentation. PAC used for chemotherapy.  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 Ambulatory with effort as she is morbidly obese No alopecia  HEENT:PERRL, sclerae not icteric. Oral mucosa moist without lesions, posterior pharynx clear.  Neck supple. No JVD.  Lymphatics:no cervical,supraclavicular adenopathy Resp: decreased BS left base as before, otherwise clear to auscultation bilaterally  and normal percussion bilaterally Cardio: regular rate and rhythm. No gallop. GI: abdomen obese, soft, nontender, cannot appreciate hepatomegaly. Normally active bowel sounds.  Musculoskeletal/ Extremities: LE without pitting edema, cords, tenderness. No obvious lymphedema UE Neuro: speech fluent and appropriate, moves all extremities equally, nonfocal. PSYCH appropriate mood and affect Skin slight erythema no burn in sun exposed areas. Scar left upper back without evidence of recurrent melanoma. Otherwise without rash, ecchymosis, petechiae Breasts: bilateral mastectomies   Lab Results:  Results for orders placed or performed in visit on 12/24/15  CBC with Differential  Result Value Ref Range   WBC 7.7 3.9 - 10.3 10e3/uL   NEUT# 5.5 1.5 - 6.5 10e3/uL   HGB 13.0 11.6 - 15.9 g/dL   HCT 38.8 34.8 - 46.6 %   Platelets 138 (L) 145 - 400 10e3/uL   MCV 98.5 79.5 - 101.0 fL   MCH 33.0 25.1 - 34.0 pg   MCHC 33.5 31.5 - 36.0 g/dL   RBC 3.94 3.70 - 5.45 10e6/uL   RDW 16.9 (H) 11.2 - 14.5 %   lymph# 1.5 0.9 - 3.3 10e3/uL   MONO# 0.6 0.1 - 0.9 10e3/uL   Eosinophils Absolute 0.1 0.0 - 0.5 10e3/uL   Basophils Absolute 0.0 0.0 - 0.1 10e3/uL   NEUT% 71.1 38.4 - 76.8 %   LYMPH% 19.1 14.0 - 49.7 %   MONO% 8.0 0.0 - 14.0 %   EOS% 1.4 0.0 - 7.0 %   BASO% 0.4 0.0 - 2.0 %  Comprehensive metabolic panel  Result Value Ref Range   Sodium 144 136 - 145 mEq/L   Potassium 4.5 3.5 - 5.1 mEq/L   Chloride 105 98 - 109 mEq/L   CO2 29 22 - 29 mEq/L   Glucose 194 (H) 70 - 140 mg/dl   BUN 18.1 7.0 - 26.0 mg/dL   Creatinine 0.8 0.6 - 1.1 mg/dL   Total Bilirubin 1.69 (H) 0.20 - 1.20 mg/dL   Alkaline Phosphatase 61 40 - 150 U/L   AST 32 5 - 34 U/L   ALT 20 0 - 55 U/L   Total Protein 6.6 6.4 - 8.3 g/dL   Albumin 3.6 3.5 - 5.0 g/dL   Calcium 9.4 8.4 - 10.4 mg/dL   Anion Gap 10 3 - 11 mEq/L   EGFR 73 (L) >90 ml/min/1.73 m2  Cancer antigen 27.29  Result Value Ref Range   CA 27.29 152.3 (H) 0.0 - 38.6 U/mL    CA 2729 had been 169 on 09-21-15 (Xeloda begun 09-25-15) then 197 on 10-29-15.   CBC discussed at time of visit. We will let her know CA 2729 results.  Studies/Results:  No results found.  Medications: I have reviewed the patient's current medications. Continue xeloda this course at 1500 mg bid.  DISCUSSION Clinically seems stable and is tolerating xeloda adequately, with CA 2729 marker available after visit a little better. Will get CT abdomen to reevaluate liver met prior to next visit. Patient is very willing to continue xeloda for now.    She prefers appointments Thursdays late morning if possible  Assessment/Plan: 1.Bilateral stage III ER PR negative node negative breast cancers 1997 at age 26 and premenopausal, treated with bilateral mastectomies and axillary node dissections,  adjuvant CMF chemotherapy and radiation to chest walls and supraclavicular areas. 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 On Letrozole from 04-2010 until 03-2015 changed to tamoxifen due to increasing marker, tho PET CT 11-2013 did not show imaging correlation. CT CAP 08-25-15 stable in lungs however with new solitary liver lesion. Liver biopsy confirmed metastatic breast cancer. Cycle 1 Xeloda begun 09-25-15, 1500 mg bid x 14 days every 21 days. Cycle 5 began 12-19-15. Following CA 2729. Will repeat CT abdomen to visualize liver prior to next visit.   BRCA reportedly normal. 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 CA 125 essentially stable. She sees gyn onc annually. 3. Melanoma upper back 2013, treated with wide excision. Followed by dermatology. Records received from Dr Allyson Sabal, which did not include actual path report; scanned into this EMR. 4.would need PAC for any IV chemo due to bilateral axillary node  surgery and bilateral radiation 5. Sclerotic lesion in left humeral head stable back to 02-2013 6.morbid obesity, BMI 40.8. Weight loss due to improvement in eating habits and increased exercise would be beneficial. 7..chronic orthopedic problems left foot: follows with podiatrist. 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 Arthralgias in hands much better off aromatase inhibitor    All questions answered and patient is in agreement with recommendations and plans. Time spent 25 min including >50% counseling and coordination of care. Route Dr Tamala Julian    Evlyn Clines, MD   12/25/2015, 5:54 PM

## 2015-12-25 ENCOUNTER — Telehealth: Payer: Self-pay

## 2015-12-25 ENCOUNTER — Encounter: Payer: Self-pay | Admitting: Oncology

## 2015-12-25 LAB — CANCER ANTIGEN 27.29: CA 27.29: 152.3 U/mL — ABNORMAL HIGH (ref 0.0–38.6)

## 2015-12-25 NOTE — Telephone Encounter (Signed)
-----   Message from Gordy Levan, MD sent at 12/25/2015  8:33 AM EDT ----- Labs seen and need follow up  Please let her know marker is some lower, 152 compared with 197. I would still like to get scans as discussed, but this is better

## 2015-12-25 NOTE — Telephone Encounter (Signed)
S/w pt per Dr Camila Li attached message.

## 2015-12-28 NOTE — Telephone Encounter (Signed)
Told  Brittany Porter  The result  of the CA 27.29 from 12-24-15 as noted below by Dr. Marko Plume. Requested that she call Dr. Mariana Kaufman office on 01-04-16 if she has not received a call from central scheduling for her scan appointment.   Brittany Cortez verbalized understanding.

## 2015-12-31 ENCOUNTER — Telehealth: Payer: Self-pay | Admitting: *Deleted

## 2015-12-31 NOTE — Telephone Encounter (Signed)
Patient called stating that she would like to know the status of her CT scan appointment. RN does not see it authorized at this time. Message routed to Darlena/ MD [facility] nurse for follow up

## 2016-01-01 NOTE — Telephone Encounter (Signed)
Spoke with Darlena and she obtained the prior authorization for Ms Coldren' scan.  Notified radiology scheduling so scan appointment could be made for the patient.

## 2016-01-04 ENCOUNTER — Telehealth: Payer: Self-pay

## 2016-01-04 ENCOUNTER — Other Ambulatory Visit: Payer: Self-pay | Admitting: Oncology

## 2016-01-04 DIAGNOSIS — C50911 Malignant neoplasm of unspecified site of right female breast: Secondary | ICD-10-CM

## 2016-01-04 DIAGNOSIS — C50912 Malignant neoplasm of unspecified site of left female breast: Principal | ICD-10-CM

## 2016-01-04 NOTE — Telephone Encounter (Signed)
A lab appt will be added to 8/22 after her CT test. This is for evaluation of xeloda refill. Due to restart xeloda on 8/24. S/w pt and she is aware.

## 2016-01-05 ENCOUNTER — Encounter (HOSPITAL_COMMUNITY): Payer: Self-pay

## 2016-01-05 ENCOUNTER — Ambulatory Visit (HOSPITAL_COMMUNITY)
Admission: RE | Admit: 2016-01-05 | Discharge: 2016-01-05 | Disposition: A | Discharge status: Home / Self Care | Payer: Medicare Other | Source: Ambulatory Visit | Attending: Oncology | Admitting: Oncology

## 2016-01-05 ENCOUNTER — Telehealth: Payer: Self-pay

## 2016-01-05 ENCOUNTER — Other Ambulatory Visit (HOSPITAL_BASED_OUTPATIENT_CLINIC_OR_DEPARTMENT_OTHER): Payer: Medicare Other

## 2016-01-05 DIAGNOSIS — K5790 Diverticulosis of intestine, part unspecified, without perforation or abscess without bleeding: Secondary | ICD-10-CM | POA: Insufficient documentation

## 2016-01-05 DIAGNOSIS — J984 Other disorders of lung: Secondary | ICD-10-CM | POA: Diagnosis not present

## 2016-01-05 DIAGNOSIS — I700 Atherosclerosis of aorta: Secondary | ICD-10-CM | POA: Insufficient documentation

## 2016-01-05 DIAGNOSIS — C50919 Malignant neoplasm of unspecified site of unspecified female breast: Secondary | ICD-10-CM | POA: Diagnosis not present

## 2016-01-05 DIAGNOSIS — J90 Pleural effusion, not elsewhere classified: Secondary | ICD-10-CM | POA: Diagnosis not present

## 2016-01-05 DIAGNOSIS — C787 Secondary malignant neoplasm of liver and intrahepatic bile duct: Secondary | ICD-10-CM | POA: Diagnosis not present

## 2016-01-05 DIAGNOSIS — K760 Fatty (change of) liver, not elsewhere classified: Secondary | ICD-10-CM | POA: Diagnosis not present

## 2016-01-05 DIAGNOSIS — C7951 Secondary malignant neoplasm of bone: Secondary | ICD-10-CM | POA: Diagnosis not present

## 2016-01-05 HISTORY — DX: Malignant melanoma of skin, unspecified: C43.9

## 2016-01-05 LAB — CBC WITH DIFFERENTIAL/PLATELET
BASO%: 0.8 % (ref 0.0–2.0)
Basophils Absolute: 0.1 10*3/uL (ref 0.0–0.1)
EOS%: 3.4 % (ref 0.0–7.0)
Eosinophils Absolute: 0.2 10*3/uL (ref 0.0–0.5)
HCT: 40.6 % (ref 34.8–46.6)
HGB: 13.5 g/dL (ref 11.6–15.9)
LYMPH%: 21 % (ref 14.0–49.7)
MCH: 33.3 pg (ref 25.1–34.0)
MCHC: 33.2 g/dL (ref 31.5–36.0)
MCV: 100.5 fL (ref 79.5–101.0)
MONO#: 0.6 10*3/uL (ref 0.1–0.9)
MONO%: 8.6 % (ref 0.0–14.0)
NEUT#: 4.6 10*3/uL (ref 1.5–6.5)
NEUT%: 66.2 % (ref 38.4–76.8)
Platelets: 147 10*3/uL (ref 145–400)
RBC: 4.04 10*6/uL (ref 3.70–5.45)
RDW: 20.3 % — ABNORMAL HIGH (ref 11.2–14.5)
WBC: 7 10*3/uL (ref 3.9–10.3)
lymph#: 1.5 10*3/uL (ref 0.9–3.3)

## 2016-01-05 LAB — COMPREHENSIVE METABOLIC PANEL
ALT: 19 U/L (ref 0–55)
AST: 34 U/L (ref 5–34)
Albumin: 3.6 g/dL (ref 3.5–5.0)
Alkaline Phosphatase: 59 U/L (ref 40–150)
Anion Gap: 11 mEq/L (ref 3–11)
BUN: 10.6 mg/dL (ref 7.0–26.0)
CO2: 29 mEq/L (ref 22–29)
Calcium: 9.4 mg/dL (ref 8.4–10.4)
Chloride: 103 mEq/L (ref 98–109)
Creatinine: 0.7 mg/dL (ref 0.6–1.1)
EGFR: 85 mL/min/{1.73_m2} — ABNORMAL LOW (ref >90)
Glucose: 208 mg/dl — ABNORMAL HIGH (ref 70–140)
Potassium: 4.8 mEq/L (ref 3.5–5.1)
Sodium: 142 mEq/L (ref 136–145)
Total Bilirubin: 1.5 mg/dL — ABNORMAL HIGH (ref 0.20–1.20)
Total Protein: 6.6 g/dL (ref 6.4–8.3)

## 2016-01-05 MED ORDER — IOPAMIDOL (ISOVUE-300) INJECTION 61%
100.0000 mL | Freq: Once | INTRAVENOUS | Status: AC | PRN
  Administered 2016-01-05: 100 mL via INTRAVENOUS

## 2016-01-05 NOTE — Telephone Encounter (Signed)
Told Ms Edmonston that her blood counts were fine today,howvever, her Ct Scan  Of the abdomen showed some increase in the size of the liver lesion.  There is a possibility  of a lesion in the T8 vertebral body noted on scan results. Dr. Marko Plume will discuss the scan and treatment options at her visit 01-14-16.   The xeloda will not be resumed on 01-07-16 as previously planned. Ms Reitan verbalized understanding.

## 2016-01-05 NOTE — Telephone Encounter (Signed)
-----   Message from Janace Hoard, RN sent at 01/04/2016 11:27 AM EDT ----- Regarding: labs Brittany Porter is to get labs on 8/22 after her CT. Then we can address refilling her xeloda. FYI

## 2016-01-10 ENCOUNTER — Other Ambulatory Visit: Payer: Self-pay | Admitting: Oncology

## 2016-01-10 DIAGNOSIS — C787 Secondary malignant neoplasm of liver and intrahepatic bile duct: Principal | ICD-10-CM

## 2016-01-10 DIAGNOSIS — C50919 Malignant neoplasm of unspecified site of unspecified female breast: Secondary | ICD-10-CM

## 2016-01-14 ENCOUNTER — Other Ambulatory Visit (HOSPITAL_BASED_OUTPATIENT_CLINIC_OR_DEPARTMENT_OTHER): Payer: Medicare Other

## 2016-01-14 ENCOUNTER — Ambulatory Visit (HOSPITAL_BASED_OUTPATIENT_CLINIC_OR_DEPARTMENT_OTHER): Payer: Medicare Other | Admitting: Oncology

## 2016-01-14 ENCOUNTER — Telehealth: Payer: Self-pay | Admitting: Oncology

## 2016-01-14 ENCOUNTER — Other Ambulatory Visit: Payer: Self-pay

## 2016-01-14 ENCOUNTER — Encounter: Payer: Self-pay | Admitting: Oncology

## 2016-01-14 VITALS — BP 146/80 | HR 83 | Temp 98.7°F | Resp 18 | Ht 63.0 in | Wt 230.9 lb

## 2016-01-14 DIAGNOSIS — C55 Malignant neoplasm of uterus, part unspecified: Secondary | ICD-10-CM

## 2016-01-14 DIAGNOSIS — Z8542 Personal history of malignant neoplasm of other parts of uterus: Secondary | ICD-10-CM

## 2016-01-14 DIAGNOSIS — C50919 Malignant neoplasm of unspecified site of unspecified female breast: Secondary | ICD-10-CM | POA: Diagnosis not present

## 2016-01-14 DIAGNOSIS — Z8543 Personal history of malignant neoplasm of ovary: Secondary | ICD-10-CM

## 2016-01-14 DIAGNOSIS — C787 Secondary malignant neoplasm of liver and intrahepatic bile duct: Secondary | ICD-10-CM

## 2016-01-14 DIAGNOSIS — C782 Secondary malignant neoplasm of pleura: Secondary | ICD-10-CM

## 2016-01-14 DIAGNOSIS — C569 Malignant neoplasm of unspecified ovary: Secondary | ICD-10-CM

## 2016-01-14 DIAGNOSIS — Z85820 Personal history of malignant melanoma of skin: Secondary | ICD-10-CM

## 2016-01-14 DIAGNOSIS — Z5111 Encounter for antineoplastic chemotherapy: Secondary | ICD-10-CM

## 2016-01-14 DIAGNOSIS — C50912 Malignant neoplasm of unspecified site of left female breast: Principal | ICD-10-CM

## 2016-01-14 DIAGNOSIS — C50911 Malignant neoplasm of unspecified site of right female breast: Secondary | ICD-10-CM

## 2016-01-14 DIAGNOSIS — C4359 Malignant melanoma of other part of trunk: Secondary | ICD-10-CM

## 2016-01-14 LAB — COMPREHENSIVE METABOLIC PANEL
ALT: 19 U/L (ref 0–55)
AST: 40 U/L — ABNORMAL HIGH (ref 5–34)
Albumin: 3.7 g/dL (ref 3.5–5.0)
Alkaline Phosphatase: 61 U/L (ref 40–150)
Anion Gap: 12 mEq/L — ABNORMAL HIGH (ref 3–11)
BUN: 17.4 mg/dL (ref 7.0–26.0)
CO2: 27 mEq/L (ref 22–29)
Calcium: 9.1 mg/dL (ref 8.4–10.4)
Chloride: 101 mEq/L (ref 98–109)
Creatinine: 0.8 mg/dL (ref 0.6–1.1)
EGFR: 72 mL/min/{1.73_m2} — ABNORMAL LOW (ref >90)
Glucose: 249 mg/dl — ABNORMAL HIGH (ref 70–140)
Potassium: 4.7 mEq/L (ref 3.5–5.1)
Sodium: 140 mEq/L (ref 136–145)
Total Bilirubin: 1.75 mg/dL — ABNORMAL HIGH (ref 0.20–1.20)
Total Protein: 6.7 g/dL (ref 6.4–8.3)

## 2016-01-14 LAB — CBC WITH DIFFERENTIAL/PLATELET
BASO%: 0.8 % (ref 0.0–2.0)
Basophils Absolute: 0.1 10*3/uL (ref 0.0–0.1)
EOS%: 1.6 % (ref 0.0–7.0)
Eosinophils Absolute: 0.1 10*3/uL (ref 0.0–0.5)
HCT: 41.4 % (ref 34.8–46.6)
HGB: 13.5 g/dL (ref 11.6–15.9)
LYMPH%: 21.3 % (ref 14.0–49.7)
MCH: 33 pg (ref 25.1–34.0)
MCHC: 32.6 g/dL (ref 31.5–36.0)
MCV: 101.3 fL — ABNORMAL HIGH (ref 79.5–101.0)
MONO#: 0.6 10*3/uL (ref 0.1–0.9)
MONO%: 7.7 % (ref 0.0–14.0)
NEUT#: 5.6 10*3/uL (ref 1.5–6.5)
NEUT%: 68.6 % (ref 38.4–76.8)
Platelets: 137 10*3/uL — ABNORMAL LOW (ref 145–400)
RBC: 4.08 10*6/uL (ref 3.70–5.45)
RDW: 19.5 % — ABNORMAL HIGH (ref 11.2–14.5)
WBC: 8.1 10*3/uL (ref 3.9–10.3)
lymph#: 1.7 10*3/uL (ref 0.9–3.3)

## 2016-01-14 MED ORDER — CAPECITABINE 500 MG PO TABS: ORAL_TABLET | ORAL | 0 refills | Status: DC

## 2016-01-14 MED FILL — CAPECITABINE 500 MG TABLET: 500 | 21 days supply | Qty: 84 | Fill #0

## 2016-01-14 NOTE — Telephone Encounter (Signed)
appt made and avs printed °

## 2016-01-14 NOTE — Progress Notes (Signed)
OFFICE PROGRESS NOTE   January 14, 2016   Physicians: Marcy Panning), [name], [name], Fanny Skates, [name] (PCP), Jeneen Rinks Kinard), [facility], S.Dahldorf/ Mina Marble of [facility]  INTERVAL HISTORY:   Patient is seen, alone for visit, in continuing attention to metastatic breast cancer, which has involved left pleura since 2011 and progressed with single area in liver 08-2015. Tho initial bilateral breast cancers in 03-1996 were reported ER PR negative, the malignant pleural fluid was ER PR + and HER 2 negative, and the liver met also ER PR +. She was treated with hormonal  blockers from late 2011 until began xeloda 09-25-15. She had restaging CT abdomen 01-05-16.  Xeloda due to begin next cycle 01-07-16, delayed until this visit.   She had scheduled visit with Dr [name] last week, flu vaccine done and Hgb A1c reportedly higher. No changes in medications per patient. She has no scheduled f/u with orthopedics  Patient has not felt differently or better in slightly longer break off of xeloda, still fatigued at times, which she understands is likely multifactorial including morbid obesity. 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  She denies nausea, mucositis, diarrhea, increased SOB, cough, chest or abdominal pain, bleeding, cardiac symptoms. Appetite is at baseline. Slight swelling LE improves with elevation, not painful. No fever or symptoms of infection. Avoids sun exposure. No hand foot syndrome, tho hands very dry when she does not use regular lotion. No new or different pain. Mid back pain improved since 3 injections by Dr Mina Marble spring 2017, known to have 4 discs per patient, only occasional discomfort there which is positional.  Remainder of 10 point Review of Systems negative.   No PAC Flu vaccine by PCP [id number] Patient confirms that she had genetics testing 2010, negative  BRCA. CA 2729 was 71 in 04-2010 and 168 as baseline for xeloda  09-21-15.   ONCOLOGIC HISTORY Chemotherapy and radiation therapy information from 1997-98 as well as medical oncology note from 11-12-2008 out of [facility] scanned into EPIC.  Bilateral stage 3 node negative, ER PR negative breast cancers treated with bilateral mastectomies and bilateral axillary node dissections by Dr Andrey Campanile 11-20- 1997 at age 70 and premenopausal. Full path report not included in this information, however information in note gives path [id number] adenocarcinoma moderately well differentiated 5.5 cm on right, no lymphatic or vascular invasion and on left 6 cm well differentiated , no lymphatic invasion, DNA index 1.09 and "ER PR receptors reported negative". She received 6 cycles of CMF chemotherapy from 05-15-96 thru 08-13-96 by Dr Tressie Stalker. She had radiation by Dr Nila Nephew 5040 cGy to bilateral chest wall with scars to 5940 cGy and bilateral supraclavicular regions to 4140 cGy, given 10-14-96 thru 11-29-96.She had PAC for the adjuvant chemotherapy. 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 She was begun on letrozole at that time.Note CA 2729 was elevated at time of recurrent disease. She continued letrozole from ~ 04-2010 until rising CA 2729 in 03-2015, with hormonal blocker changed to tamoxifen 04-05-15. CT CAP 08-25-15 had new single liver lesion, biopsied by IR on 09-11-15, confirming metastatic breast. Tamoxifen was [facility] and she began xeloda 09-25-15 at 1500 mg bid x 14 days every 21 days. 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 She received 6 cycles adjuvant taxol carboplatin thru  Nov 2010 and vaginal cuff brachytherapy completed Jan 2011. Note CA125 was elevated at presentation. PAC used for chemotherapy.  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 Ambulatory alowly without assistance, with morbid obesity.  No alopecia  HEENT:PERRL, sclerae not icteric. Oral mucosa moist without lesions, posterior pharynx clear.  Neck supple. No JVD.  Lymphatics:no suraclavicular adenopathy Resp: clear to auscultation bilaterally diminished BS bases consistent with habitus, no dullness to percussion Cardio: regular rate and rhythm. No gallop. YI:FOYDXAJ obese,  soft, nontender, not distended, no mass or organomegaly. Normally active bowel sounds.  Musculoskeletal/ Extremities: UE/ LE without pitting edema, cords, tenderness. Spine not tender to palpation Neuro: no peripheral neuropathy. Otherwise nonfocal. PSYCH appropriate mood and affect Skin some increased erythema in sun exposed areas consistent with 5FU effect in this patient with very fair complexion, otherwise without rash, ecchymosis, petechiae. Excisional scar from melanoma upper back without evidence of local recurrence.  Breasts: bilateral mastectomy scars unchanged   Lab Results:  Results for orders placed or performed in visit on 01/14/16  CBC with Differential  Result Value Ref Range   WBC 8.1 3.9 - 10.3 10e3/uL   NEUT# 5.6 1.5 - 6.5 10e3/uL   HGB 13.5 11.6 - 15.9 g/dL   HCT 41.4 34.8 - 46.6 %   Platelets 137 (L) 145 - 400 10e3/uL   MCV 101.3 (H) 79.5 - 101.0 fL   MCH 33.0 25.1 - 34.0 pg   MCHC 32.6 31.5 - 36.0 g/dL   RBC 4.08 3.70 - 5.45 10e6/uL    RDW 19.5 (H) 11.2 - 14.5 %   lymph# 1.7 0.9 - 3.3 10e3/uL   MONO# 0.6 0.1 - 0.9 10e3/uL   Eosinophils Absolute 0.1 0.0 - 0.5 10e3/uL   Basophils Absolute 0.1 0.0 - 0.1 10e3/uL   NEUT% 68.6 38.4 - 76.8 %   LYMPH% 21.3 14.0 - 49.7 %   MONO% 7.7 0.0 - 14.0 %   EOS% 1.6 0.0 - 7.0 %   BASO% 0.8 0.0 - 2.0 %  Comprehensive metabolic panel  Result Value Ref Range   Sodium 140 136 - 145 mEq/L   Potassium 4.7 3.5 - 5.1 mEq/L   Chloride 101 98 - 109 mEq/L   CO2 27 22 - 29 mEq/L   Glucose 249 (H) 70 - 140 mg/dl   BUN 17.4 7.0 - 26.0 mg/dL   Creatinine 0.8 0.6 - 1.1 mg/dL   Total Bilirubin 1.75 (H) 0.20 - 1.20 mg/dL   Alkaline Phosphatase 61 40 - 150 U/L   AST 40 (H) 5 - 34 U/L   ALT 19 0 - 55 U/L   Total Protein 6.7 6.4 - 8.3 g/dL   Albumin 3.7 3.5 - 5.0 g/dL   Calcium 9.1 8.4 - 10.4 mg/dL   Anion Gap 12 (H) 3 -  11 mEq/L   EGFR 72 (L) >90 ml/min/1.73 m2   CA 2729 available after visit a little lower, at 135.7, this compared with 152 on 12-24-15 and 197 on 10-29-15     Studies/Results:  Study Result   CLINICAL DATA:  Metastatic breast cancer with biopsy-proven liver metastasis in April 2017, presenting for restaging and chemotherapy. Additional history of melanoma, endometrial and ovarian cancer.  EXAM: CT ABDOMEN WITH CONTRAST  01-05-16  COMPARISON:  08/25/2015 CT abdomen/ pelvis.  FINDINGS: Lower chest: Stable small left pleural effusion and diffuse left pleural thickening with left pleural calcification. Stable extensive bandlike and nodular consolidation, volume loss and distortion at the left lung base, most consistent with a combination of rounded atelectasis and pleural-parenchymal scarring. Coronary atherosclerosis.  Hepatobiliary: Diffuse hepatic steatosis. Solitary segment 7 right liver lobe 2.5 x 2.1 cm mass (series 2/ image 23), previously 2.1 x 1.6 cm, increased. No additional liver masses. Normal gallbladder with no radiopaque cholelithiasis. No biliary  ductal dilatation.  Pancreas: Normal, with no mass or duct dilation.  Spleen: Normal size. No mass. 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  Adrenals/Urinary Tract: No discrete adrenal nodule. There are 2 subcentimeter hypodense renal cortical lesions in the upper right kidney, too small to characterize, unchanged. No new renal lesions. No hydronephrosis.  Stomach/Bowel: Grossly normal stomach. Visualized small and large bowel is normal caliber, with no bowel wall thickening. Mild scattered diverticulosis in the visualized colon.  Vascular/Lymphatic: Atherosclerotic nonaneurysmal abdominal aorta. Patent portal, splenic, hepatic and renal veins. No pathologically enlarged lymph nodes in the abdomen.  Other: No pneumoperitoneum, ascites or focal fluid collection. Partially visualized mesh repair of ventral abdominal hernia, with no recurrent hernia in the visualized abdominal wall. Partially visualized bilateral mastectomy.  Musculoskeletal: Enlarging sclerotic osseous lesion in the right posterior T8 vertebral body (series 4/ image 24). No additional aggressive appearing focal osseous lesions. Moderate spondylosis in the visualized thoracolumbar spine.  IMPRESSION: 1. Mild interval growth of solitary right liver lobe metastasis. 2. Enlarging sclerotic osseous lesion in the right posterior T8 vertebral body, worrisome for a sclerotic osseous metastasis. 3. No additional sites of metastatic disease in the abdomen. 4. Stable small left pleural effusion, left pleural thickening and left pleural calcification, consistent with treated tumor/talc pleurodesis. Stable bandlike and nodular consolidation, volume loss and distortion at the left lung base, most consistent with rounded atelectasis/scarring. 5. Additional findings include diffuse hepatic steatosis, mild diverticulosis and aortic atherosclerosis.    CT findings discussed  with patient and PACs images reviewed with her.    Medications: I have reviewed the patient's current medications. Xeloda 1500 mg bid x 14 days every 21 days, likely to begin 01-15-16.  DISCUSSION CT information and possible interpretations discussed: Baseline CT for xeloda was done 4 weeks prior to start of xeloda, so direct measurement comparison not definitive for the liver lesion, however no new areas of disease on this scan and lower chest stable. Sclerotic area at T8 could be an improving met or new sclerotic met,  or possibly related to the orthopedic procedure spring 2017. She is tolerating xeloda really very well, tho there are also other IV chemo options as well as possible nonchemo options, including possibly exemestane + everolimus or back to AI with addition of Ibrance. With marker trending down, we do not want to stop a potentially helpful intervention prematurely. Patient is in agreement with continuing xeloda with close monitoring of marker and repeat scans likely to include PET in  2-3 months. I have explained to patient that PET will be more useful that CT in evaluating bone and the residual chest findings. I will request notes from Dr Mina Marble and share this scan information with him. If more pain in T8 region will need to evaluate further. Not on IV bisphosphonate now.    Assessment/Plan:  1.Bilateral breast cancers 1997 treated with bilateral mastectomies and axillary node dissections, adjuvant chemotherapy and radiation. 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 On Letrozole from 04-2010 until 03-2015 changed to tamoxifen due to increasing marker, tho PET CT 11-2013 did not show imaging correlation. BRCA reportedly normal. CT CAP 08-25-15 stable  except new solitary liver lesion, biopsy showing metastatic ER PR + breast. Began xeloda 09-25-15. CA 2729 trending down, restaging CT abdomen slight increase in solitary liver lesion, sclerotic  area T8, lower chest stable. Will continue xeloda, follow marker, repeat scans in 2-3 mo.  Will check labs on 9-21 around start of next cycle xeloda, ok to treat if ANC >=1.5 and plt >=100k. I will see her at least 10-12 with lab. 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 CA 125 stable in normal range. She saw Dr Skeet Latch 11-13-14 3. Melanoma upper back 2013, treated with wide excision. Followed by dermatology. That pathology not in this EMR tho some information obtained from Dr Allyson Sabal 4. Spine injections x 3 by Dr Mina Marble ~ spring 2017, will request information. Sclerotic lesion in left humeral head stable baxk to 02-2013. 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 Weight loss due to improvement in eating habits and increased exercise would be beneficial. 7..normal bone density 03-2015. Arthralgias in hands better off aromatase inhibitor    8.type 2 DM: per patient, Hgb A1c up some 12-2015 by Dr [name]  All questions answered and patient is in agreement with recommendations and plans as above; we will let her know improvement in CA 2729 marker available after this visit. Time spent 35 min including >50% counseling and coordination of care.  Route Dr Tamala Julian. CC Dr Mina Marble and update gyn onc.   Evlyn Clines, MD   01/14/2016, 7:25 PM

## 2016-01-15 LAB — CANCER ANTIGEN 27.29: CA 27.29: 135.7 U/mL — ABNORMAL HIGH (ref 0.0–38.6)

## 2016-01-20 ENCOUNTER — Telehealth: Payer: Self-pay | Admitting: Oncology

## 2016-01-20 ENCOUNTER — Telehealth: Payer: Self-pay | Admitting: *Deleted

## 2016-01-20 ENCOUNTER — Telehealth: Payer: Self-pay

## 2016-01-20 NOTE — Telephone Encounter (Signed)
Called Dr. Leland Her office left vm in ref to obtaining pts records.

## 2016-01-20 NOTE — Telephone Encounter (Signed)
"  Brittany Porter with Dr. Normajean Glasgow [facility] calling about a CT Abdomen report sent to [place] on 01-17-2016.  We're Orthopedic,do not use this information and need to ensure she's receiving the care and correct treatment."  Advised Dr. Marko Plume routed this report to Dr. Mina Marble to coordinate care.  No further questions.

## 2016-01-20 NOTE — Telephone Encounter (Signed)
Told [name] the results of the CA 27.29 as noted below by Dr. Marko Plume.

## 2016-01-20 NOTE — Telephone Encounter (Signed)
-----   Message from Gordy Levan, MD sent at 01/15/2016 11:25 AM EDT ----- Labs seen and need follow up: please let her know marker is a little lower again. I think continuing xeloda as we decided is best for now   thanks

## 2016-02-01 ENCOUNTER — Other Ambulatory Visit: Payer: Self-pay | Admitting: Oncology

## 2016-02-01 DIAGNOSIS — C50912 Malignant neoplasm of unspecified site of left female breast: Principal | ICD-10-CM

## 2016-02-01 DIAGNOSIS — C50911 Malignant neoplasm of unspecified site of right female breast: Secondary | ICD-10-CM

## 2016-02-01 MED FILL — CAPECITABINE 500 MG TABLET: 500 | 21 days supply | Qty: 84 | Fill #0

## 2016-02-04 ENCOUNTER — Telehealth: Payer: Self-pay

## 2016-02-04 ENCOUNTER — Other Ambulatory Visit (HOSPITAL_BASED_OUTPATIENT_CLINIC_OR_DEPARTMENT_OTHER): Payer: Medicare Other

## 2016-02-04 DIAGNOSIS — C787 Secondary malignant neoplasm of liver and intrahepatic bile duct: Secondary | ICD-10-CM | POA: Diagnosis not present

## 2016-02-04 DIAGNOSIS — C50919 Malignant neoplasm of unspecified site of unspecified female breast: Secondary | ICD-10-CM | POA: Diagnosis not present

## 2016-02-04 LAB — CBC WITH DIFFERENTIAL/PLATELET
BASO%: 0.5 % (ref 0.0–2.0)
Basophils Absolute: 0 10*3/uL (ref 0.0–0.1)
EOS%: 1.7 % (ref 0.0–7.0)
Eosinophils Absolute: 0.2 10*3/uL (ref 0.0–0.5)
HCT: 40.9 % (ref 34.8–46.6)
HGB: 13.7 g/dL (ref 11.6–15.9)
LYMPH%: 26.4 % (ref 14.0–49.7)
MCH: 33.8 pg (ref 25.1–34.0)
MCHC: 33.5 g/dL (ref 31.5–36.0)
MCV: 101 fL (ref 79.5–101.0)
MONO#: 0.8 10*3/uL (ref 0.1–0.9)
MONO%: 9 % (ref 0.0–14.0)
NEUT#: 5.5 10*3/uL (ref 1.5–6.5)
NEUT%: 62.4 % (ref 38.4–76.8)
Platelets: 164 10*3/uL (ref 145–400)
RBC: 4.05 10*6/uL (ref 3.70–5.45)
RDW: 16.4 % — ABNORMAL HIGH (ref 11.2–14.5)
WBC: 8.9 10*3/uL (ref 3.9–10.3)
lymph#: 2.3 10*3/uL (ref 0.9–3.3)

## 2016-02-04 LAB — COMPREHENSIVE METABOLIC PANEL
ALT: 26 U/L (ref 0–55)
AST: 45 U/L — ABNORMAL HIGH (ref 5–34)
Albumin: 3.7 g/dL (ref 3.5–5.0)
Alkaline Phosphatase: 61 U/L (ref 40–150)
Anion Gap: 11 mEq/L (ref 3–11)
BUN: 24.1 mg/dL (ref 7.0–26.0)
CO2: 27 mEq/L (ref 22–29)
Calcium: 9.2 mg/dL (ref 8.4–10.4)
Chloride: 105 mEq/L (ref 98–109)
Creatinine: 0.8 mg/dL (ref 0.6–1.1)
EGFR: 75 mL/min/{1.73_m2} — ABNORMAL LOW (ref >90)
Glucose: 259 mg/dl — ABNORMAL HIGH (ref 70–140)
Potassium: 4.4 mEq/L (ref 3.5–5.1)
Sodium: 143 mEq/L (ref 136–145)
Total Bilirubin: 1.08 mg/dL (ref 0.20–1.20)
Total Protein: 6.8 g/dL (ref 6.4–8.3)

## 2016-02-04 NOTE — Telephone Encounter (Signed)
S/w pt that CBC is good for continuing Xeloda. The tumor marker not resulted yet. Call tomorrow.

## 2016-02-05 LAB — CANCER ANTIGEN 27.29: CA 27.29: 157.4 U/mL — ABNORMAL HIGH (ref 0.0–38.6)

## 2016-02-05 NOTE — Telephone Encounter (Signed)
Pt called for CA 27.29. Results given.

## 2016-02-24 ENCOUNTER — Other Ambulatory Visit: Payer: Self-pay | Admitting: Oncology

## 2016-02-24 DIAGNOSIS — C569 Malignant neoplasm of unspecified ovary: Secondary | ICD-10-CM

## 2016-02-24 DIAGNOSIS — C50919 Malignant neoplasm of unspecified site of unspecified female breast: Secondary | ICD-10-CM

## 2016-02-25 ENCOUNTER — Other Ambulatory Visit (HOSPITAL_BASED_OUTPATIENT_CLINIC_OR_DEPARTMENT_OTHER): Payer: Medicare Other

## 2016-02-25 ENCOUNTER — Ambulatory Visit (HOSPITAL_BASED_OUTPATIENT_CLINIC_OR_DEPARTMENT_OTHER): Payer: Medicare Other | Admitting: Oncology

## 2016-02-25 ENCOUNTER — Other Ambulatory Visit: Payer: Self-pay

## 2016-02-25 VITALS — BP 136/84 | HR 83 | Temp 98.0°F | Resp 18 | Ht 63.0 in | Wt 227.4 lb

## 2016-02-25 DIAGNOSIS — C50919 Malignant neoplasm of unspecified site of unspecified female breast: Secondary | ICD-10-CM | POA: Diagnosis not present

## 2016-02-25 DIAGNOSIS — Z8543 Personal history of malignant neoplasm of ovary: Secondary | ICD-10-CM

## 2016-02-25 DIAGNOSIS — C55 Malignant neoplasm of uterus, part unspecified: Secondary | ICD-10-CM

## 2016-02-25 DIAGNOSIS — C787 Secondary malignant neoplasm of liver and intrahepatic bile duct: Secondary | ICD-10-CM | POA: Diagnosis not present

## 2016-02-25 DIAGNOSIS — C569 Malignant neoplasm of unspecified ovary: Secondary | ICD-10-CM

## 2016-02-25 DIAGNOSIS — C782 Secondary malignant neoplasm of pleura: Secondary | ICD-10-CM

## 2016-02-25 DIAGNOSIS — Z85820 Personal history of malignant melanoma of skin: Secondary | ICD-10-CM

## 2016-02-25 DIAGNOSIS — Z8542 Personal history of malignant neoplasm of other parts of uterus: Secondary | ICD-10-CM

## 2016-02-25 DIAGNOSIS — Z5111 Encounter for antineoplastic chemotherapy: Secondary | ICD-10-CM

## 2016-02-25 LAB — CBC WITH DIFFERENTIAL/PLATELET
BASO%: 0.3 % (ref 0.0–2.0)
Basophils Absolute: 0 10*3/uL (ref 0.0–0.1)
EOS%: 1.4 % (ref 0.0–7.0)
Eosinophils Absolute: 0.1 10*3/uL (ref 0.0–0.5)
HCT: 39.7 % (ref 34.8–46.6)
HGB: 13.6 g/dL (ref 11.6–15.9)
LYMPH%: 23.7 % (ref 14.0–49.7)
MCH: 34.2 pg — ABNORMAL HIGH (ref 25.1–34.0)
MCHC: 34.3 g/dL (ref 31.5–36.0)
MCV: 99.7 fL (ref 79.5–101.0)
MONO#: 0.6 10*3/uL (ref 0.1–0.9)
MONO%: 7.8 % (ref 0.0–14.0)
NEUT#: 5.3 10*3/uL (ref 1.5–6.5)
NEUT%: 66.8 % (ref 38.4–76.8)
Platelets: 149 10*3/uL (ref 145–400)
RBC: 3.98 10*6/uL (ref 3.70–5.45)
RDW: 16.4 % — ABNORMAL HIGH (ref 11.2–14.5)
WBC: 7.9 10*3/uL (ref 3.9–10.3)
lymph#: 1.9 10*3/uL (ref 0.9–3.3)

## 2016-02-25 LAB — COMPREHENSIVE METABOLIC PANEL
ALT: 19 U/L (ref 0–55)
AST: 26 U/L (ref 5–34)
Albumin: 3.7 g/dL (ref 3.5–5.0)
Alkaline Phosphatase: 61 U/L (ref 40–150)
Anion Gap: 11 mEq/L (ref 3–11)
BUN: 13.8 mg/dL (ref 7.0–26.0)
CO2: 28 mEq/L (ref 22–29)
Calcium: 9.2 mg/dL (ref 8.4–10.4)
Chloride: 103 mEq/L (ref 98–109)
Creatinine: 0.8 mg/dL (ref 0.6–1.1)
EGFR: 77 mL/min/{1.73_m2} — ABNORMAL LOW (ref >90)
Glucose: 203 mg/dl — ABNORMAL HIGH (ref 70–140)
Potassium: 4.3 mEq/L (ref 3.5–5.1)
Sodium: 141 mEq/L (ref 136–145)
Total Bilirubin: 1.29 mg/dL — ABNORMAL HIGH (ref 0.20–1.20)
Total Protein: 6.7 g/dL (ref 6.4–8.3)

## 2016-02-25 MED ORDER — CAPECITABINE 500 MG PO TABS: ORAL_TABLET | ORAL | 0 refills | Status: DC

## 2016-02-25 MED FILL — CAPECITABINE 500 MG TABLET: 500 | 14 days supply | Qty: 84 | Fill #0

## 2016-02-25 NOTE — Progress Notes (Signed)
OFFICE PROGRESS NOTE   February 25, 2016   Physicians:  Marcy Panning), [name], [name], Fanny Skates, [name] (PCP), Jeneen Rinks Kinard), [place] [facility], S.Dahldorf/ Mina Marble of [facility]  INTERVAL HISTORY:  Patient is seen, alone for visit, in continuing attention to metastatic breast cancer which has involved pleura since 2011 and progressed with single area in liver 08-2015. She was on hormonal blockers (without CDK4 / 6 inhibitor) from 2011 until began xeloda 09-25-15.  Last imaging was CT abdomen 01-05-16. She is due to resume xeloda now, this 1500 mg bid x 14 days every 21 days; she is tolerating xeloda very well. 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   Patient had recent URI which is nearly resolved without intervention, husband with URI initially. 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 She has no significant nausea, no diarrhea, no mucositis. She has had no fever, no bleeding, no abdominal or pelvic pain, no increased swelling LE, no lower respiratory symtoms, no chest pain. She has tingling left forearm and fingers with certain arm positions, including with pressure on olecranon process and flexed elbow.  Remainder of 10 point review of systems negative.   No PAC Flu vaccine by PCP 12-2015 Genetics testing 2010, negative BRCA. CA 2729 was 71 in 04-2010 and 168 as baseline for xeloda  09-21-15.    ONCOLOGIC HISTORY Chemotherapy and radiation therapy information from 1997-98 as well as medical oncology note from 11-12-2008 out of [place] EMR scanned into EPIC.  Bilateral stage 3 node negative, ER PR negative breast cancers treated with bilateral mastectomies and bilateral axillary node dissections by Dr Andrey Campanile 11-20- 1997 at age 70 and premenopausal. Full path report not included in this information,  however information in note gives path [id number] adenocarcinoma moderately well differentiated 5.5 cm on right, no lymphatic or vascular invasion and on left 6 cm well differentiated , no lymphatic invasion, DNA index 1.09 and "ER PR receptors reported negative". She received 6 cycles of CMF chemotherapy from 05-15-96 thru 08-13-96 by Dr Tressie Stalker. She had radiation by Dr Nila Nephew 5040 cGy to bilateral chest wall with scars to 5940 cGy and bilateral supraclavicular regions to 4140 cGy, given 10-14-96 thru 11-29-96.She had PAC for the adjuvant chemotherapy. 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 She was begun on letrozole at that time.Note CA 2729 was elevated at time of recurrent disease. She continued letrozole from ~ 04-2010 until rising CA 2729 in 03-2015, with hormonal blocker changed to tamoxifen 04-05-15. CT CAP 08-25-15 had new single liver lesion, biopsied by IR on 09-11-15, confirming metastatic breast. 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 She received 6 cycles adjuvant taxol carboplatin thru Nov 2010 and vaginal cuff brachytherapy completed Jan 2011. Note CA125 was elevated at presentation. PAC used for chemotherapy.  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 Ambulatory without assistance.  No alopecia  HEENT:PERRL, sclerae not icteric. Oral mucosa moist without lesions, posterior pharynx clear.  Neck supple. No JVD.  Lymphatics:no cervical,supraclavicular, axillary or inguinal adenopathy Resp: decreased BS left base and dullness to percussion there, otherwise clear to auscultation bilaterally and normal percussion bilaterally Cardio: regular rate and rhythm. No gallop. VP:XTGGYIR obese, soft, nontender, not distended, cannot appreciate mass or organomegaly. Normally active bowel sounds.  Musculoskeletal/ Extremities: LE without pitting edema, cords, tenderness Neuro: tingling left hand and forearm reproducible with leaning on elbow Skin without rash, ecchymosis, petechiae Bilateral mastectomies. No swelling UE  Lab Results:  Results for orders placed or performed in visit on 02/25/16  CBC with Differential  Result Value Ref Range   WBC 7.9 3.9 - 10.3 10e3/uL   NEUT# 5.3 1.5 - 6.5 10e3/uL   HGB 13.6 11.6 - 15.9 g/dL   HCT 39.7 34.8 - 46.6 %   Platelets 149 145 - 400 10e3/uL   MCV 99.7 79.5 - 101.0 fL   MCH 34.2 (H) 25.1 - 34.0 pg   MCHC 34.3 31.5 - 36.0 g/dL   RBC 3.98 3.70 - 5.45 10e6/uL   RDW 16.4 (H) 11.2 - 14.5 %   lymph# 1.9 0.9 - 3.3 10e3/uL   MONO# 0.6 0.1 - 0.9 10e3/uL   Eosinophils Absolute 0.1 0.0 - 0.5 10e3/uL   Basophils Absolute 0.0 0.0 - 0.1 10e3/uL   NEUT% 66.8 38.4 - 76.8 %   LYMPH% 23.7 14.0 - 49.7 %   MONO% 7.8 0.0 - 14.0 %   EOS% 1.4 0.0 - 7.0 %   BASO% 0.3 0.0 - 2.0 %  Comprehensive metabolic panel  Result Value Ref Range   Sodium 141 136 - 145 mEq/L   Potassium 4.3 3.5 - 5.1 mEq/L   Chloride 103 98 - 109 mEq/L   CO2 28 22 - 29 mEq/L   Glucose 203 (H) 70 - 140 mg/dl   BUN 13.8 7.0 - 26.0 mg/dL   Creatinine 0.8 0.6 - 1.1 mg/dL   Total Bilirubin 1.29 (H) 0.20 - 1.20 mg/dL   Alkaline Phosphatase 61 40 - 150 U/L   AST 26 5 - 34 U/L   ALT 19 0 - 55 U/L   Total  Protein 6.7 6.4 - 8.3 g/dL   Albumin 3.7 3.5 - 5.0 g/dL   Calcium 9.2 8.4 - 10.4 mg/dL   Anion Gap 11 3 - 11 mEq/L   EGFR 77 (L) >90 ml/min/1.73 m2   Available after visit:  CA 125 by previous method (parallel testing) 10, having been 18 in 09-2015, and by new lab method 13.4 now as compared with 16.7 in 09-2015.  CA 2729 163, this having been 157 on 02-04-16 and 135 on 01-14-16  Studies/Results:  No results found.  Medications: I have reviewed the patient's current medications. She will resume xeloda today for one cycle  DISCUSSION She is tolerating xeloda well and clinically seems entirely stable, however marker is trending up and CT 01-05-16 was not clearly improved. PET likely will be more definitive now, as we do not want to discontinue xeloda if it is  helping, but also do not want to continue if disease is not improving or stable.  Would consider Faslodex + Ibrance if xeloda needs to change  (not discussed now).  Note from Dr Mina Marble 05-29-15 reviewed, under Media in this EMR, treated L5-S1 with injections.   Assessment/Plan:  1.Bilateral breast cancers 1997 treated with bilateral mastectomies and axillary node dissections, adjuvant chemotherapy and radiation. 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 On Letrozole from 04-2010 until 03-2015 changed to tamoxifen due to increasing marker, tho PET CT 11-2013 did not show imaging correlation. BRCA reportedly normal. CT CAP 08-25-15 stable  except new solitary liver lesion, biopsy showing metastatic ER PR + breast. Began xeloda 09-25-15. CA 2729 trending down, restaging CT abdomen slight increase in solitary liver lesion, sclerotic area T8, lower chest stable. 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 CA 125 stable in normal range. I will update Dr Skeet Latch including CA 125 results and scan information. 3. Melanoma upper back 2013, treated with wide excision. Followed by dermatology. That pathology not in this EMR tho some information obtained from Dr Allyson Sabal 4. L5-S1 spine injections x 3 by Dr Mina Marble ~ spring 2017 Sclerotic lesion in left humeral head stable baxk to 02-2013. 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 Weight loss due to improvement in eating habits and increased exercise would be beneficial. 7..normal bone density 03-2015. Arthralgias in hands better off aromatase inhibitor  8.type 2 DM: per patient, Hgb A1c up some 12-2015 by Dr [name]  All questions answered. PET requested, message to managed care for preauth. Time spent 25 min including >50% counseling and coordination of care. CC PCP, message to gyn onc    Brittany Clines, MD   02/25/2016, 6:27 PM

## 2016-02-26 ENCOUNTER — Telehealth: Payer: Self-pay | Admitting: *Deleted

## 2016-02-26 LAB — CANCER ANTIGEN 27.29: CA 27.29: 163.7 U/mL — ABNORMAL HIGH (ref 0.0–38.6)

## 2016-02-26 LAB — CANCER ANTIGEN 125 (PARALLEL TESTING): CA 125: 10 U/mL (ref <35)

## 2016-02-26 LAB — CA 125: Cancer Antigen (CA) 125: 13.4 U/mL (ref 0.0–38.1)

## 2016-02-26 NOTE — Telephone Encounter (Signed)
Called and notified patient of appointment change. Pt's Oct appointment has been changed to Jan. Per patient and provider. Pt agreed with time and date of her new appointment

## 2016-02-27 ENCOUNTER — Encounter: Payer: Self-pay | Admitting: Oncology

## 2016-02-27 ENCOUNTER — Other Ambulatory Visit: Payer: Self-pay | Admitting: Oncology

## 2016-02-29 ENCOUNTER — Telehealth: Payer: Self-pay

## 2016-02-29 NOTE — Telephone Encounter (Signed)
Told Ms Hast the results of the tumor markers as noted below by Dr. Marko Plume. Told ms Aja to call Dr. Mariana Kaufman nurse on Friday 03-04-16 if she has not received an appointment for the Pet Scan. Told her that prior authorization is taking several days to be completed. Ms Mabey verbalized understanding.

## 2016-02-29 NOTE — Telephone Encounter (Signed)
-----   Message from Gordy Levan, MD sent at 02/27/2016  8:45 AM EDT ----- Labs seen and need follow up: she may already see on MyChart, but can let her know CA 2729 marker ~ stable at 163, PET will be useful as we discussed. CA 125 is still in good low range at 13    thanks

## 2016-03-03 ENCOUNTER — Ambulatory Visit: Payer: Medicare Other | Admitting: Gynecologic Oncology

## 2016-03-14 ENCOUNTER — Other Ambulatory Visit: Payer: Self-pay | Admitting: Oncology

## 2016-03-14 NOTE — Telephone Encounter (Signed)
ENCOUNTER OPENED IN ERROR

## 2016-03-16 ENCOUNTER — Telehealth: Payer: Self-pay

## 2016-03-16 NOTE — Telephone Encounter (Signed)
LM in patient's VM that Dr. Marko Plume will not refill the Xeloda until after the pet scan and visit on 03-24-16.

## 2016-03-17 ENCOUNTER — Other Ambulatory Visit: Payer: Medicare Other

## 2016-03-17 ENCOUNTER — Ambulatory Visit: Payer: Medicare Other | Admitting: Oncology

## 2016-03-20 ENCOUNTER — Other Ambulatory Visit: Payer: Self-pay | Admitting: Oncology

## 2016-03-22 ENCOUNTER — Ambulatory Visit (HOSPITAL_COMMUNITY)
Admission: RE | Admit: 2016-03-22 | Discharge: 2016-03-22 | Disposition: A | Discharge status: Home / Self Care | Payer: Medicare Other | Source: Ambulatory Visit | Attending: Oncology | Admitting: Oncology

## 2016-03-22 DIAGNOSIS — C50919 Malignant neoplasm of unspecified site of unspecified female breast: Secondary | ICD-10-CM | POA: Diagnosis present

## 2016-03-22 DIAGNOSIS — R918 Other nonspecific abnormal finding of lung field: Secondary | ICD-10-CM | POA: Diagnosis not present

## 2016-03-22 LAB — GLUCOSE, CAPILLARY: Glucose-Capillary: 172 mg/dL — ABNORMAL HIGH (ref 65–99)

## 2016-03-22 MED ORDER — FLUDEOXYGLUCOSE F - 18 (FDG) INJECTION: 11.3000 | Freq: Once | INTRAVENOUS | Status: DC | PRN

## 2016-03-24 ENCOUNTER — Encounter: Payer: Self-pay | Admitting: Oncology

## 2016-03-24 ENCOUNTER — Other Ambulatory Visit (HOSPITAL_BASED_OUTPATIENT_CLINIC_OR_DEPARTMENT_OTHER): Payer: Medicare Other

## 2016-03-24 ENCOUNTER — Ambulatory Visit (HOSPITAL_BASED_OUTPATIENT_CLINIC_OR_DEPARTMENT_OTHER): Payer: Medicare Other | Admitting: Oncology

## 2016-03-24 VITALS — BP 149/74 | HR 68 | Temp 98.6°F | Resp 18 | Ht 63.0 in | Wt 227.0 lb

## 2016-03-24 DIAGNOSIS — C50919 Malignant neoplasm of unspecified site of unspecified female breast: Secondary | ICD-10-CM | POA: Diagnosis not present

## 2016-03-24 DIAGNOSIS — C782 Secondary malignant neoplasm of pleura: Secondary | ICD-10-CM

## 2016-03-24 DIAGNOSIS — Z853 Personal history of malignant neoplasm of breast: Secondary | ICD-10-CM

## 2016-03-24 DIAGNOSIS — C7951 Secondary malignant neoplasm of bone: Secondary | ICD-10-CM

## 2016-03-24 DIAGNOSIS — Z85820 Personal history of malignant melanoma of skin: Secondary | ICD-10-CM

## 2016-03-24 DIAGNOSIS — C787 Secondary malignant neoplasm of liver and intrahepatic bile duct: Secondary | ICD-10-CM

## 2016-03-24 DIAGNOSIS — C50912 Malignant neoplasm of unspecified site of left female breast: Secondary | ICD-10-CM

## 2016-03-24 DIAGNOSIS — Z8543 Personal history of malignant neoplasm of ovary: Secondary | ICD-10-CM

## 2016-03-24 DIAGNOSIS — I878 Other specified disorders of veins: Secondary | ICD-10-CM

## 2016-03-24 DIAGNOSIS — Z8542 Personal history of malignant neoplasm of other parts of uterus: Secondary | ICD-10-CM

## 2016-03-24 LAB — COMPREHENSIVE METABOLIC PANEL
ALT: 15 U/L (ref 0–55)
AST: 28 U/L (ref 5–34)
Albumin: 3.7 g/dL (ref 3.5–5.0)
Alkaline Phosphatase: 61 U/L (ref 40–150)
Anion Gap: 11 mEq/L (ref 3–11)
BUN: 15.6 mg/dL (ref 7.0–26.0)
CO2: 28 mEq/L (ref 22–29)
Calcium: 9.7 mg/dL (ref 8.4–10.4)
Chloride: 106 mEq/L (ref 98–109)
Creatinine: 0.7 mg/dL (ref 0.6–1.1)
EGFR: 81 mL/min/{1.73_m2} — ABNORMAL LOW (ref >90)
Glucose: 85 mg/dl (ref 70–140)
Potassium: 4.8 mEq/L (ref 3.5–5.1)
Sodium: 145 mEq/L (ref 136–145)
Total Bilirubin: 1.32 mg/dL — ABNORMAL HIGH (ref 0.20–1.20)
Total Protein: 6.9 g/dL (ref 6.4–8.3)

## 2016-03-24 LAB — CBC WITH DIFFERENTIAL/PLATELET
BASO%: 0.7 % (ref 0.0–2.0)
Basophils Absolute: 0.1 10*3/uL (ref 0.0–0.1)
EOS%: 2.1 % (ref 0.0–7.0)
Eosinophils Absolute: 0.2 10*3/uL (ref 0.0–0.5)
HCT: 41.2 % (ref 34.8–46.6)
HGB: 13.7 g/dL (ref 11.6–15.9)
LYMPH%: 27.9 % (ref 14.0–49.7)
MCH: 33.5 pg (ref 25.1–34.0)
MCHC: 33.3 g/dL (ref 31.5–36.0)
MCV: 100.4 fL (ref 79.5–101.0)
MONO#: 0.7 10*3/uL (ref 0.1–0.9)
MONO%: 8.8 % (ref 0.0–14.0)
NEUT#: 4.7 10*3/uL (ref 1.5–6.5)
NEUT%: 60.5 % (ref 38.4–76.8)
Platelets: 153 10*3/uL (ref 145–400)
RBC: 4.1 10*6/uL (ref 3.70–5.45)
RDW: 18.6 % — ABNORMAL HIGH (ref 11.2–14.5)
WBC: 7.9 10*3/uL (ref 3.9–10.3)
lymph#: 2.2 10*3/uL (ref 0.9–3.3)

## 2016-03-24 NOTE — Progress Notes (Signed)
OFFICE PROGRESS NOTE   March 24, 2016   Physicians: Marcy Panning), [name], [name], Fanny Skates, [name] (PCP), Jeneen Rinks Kinard), [facility], S.Dahldorf/ Mina Marble of [facility]  INTERVAL HISTORY:  Patient is seen, alone for visit, after restaging PET 03-22-16 for metastatic breast cancer. PET shows some progression now 2 areas in liver and bone.   Patient had initial long control on hormone blockers for metastatic disease involving pleura, with letrozole then tamoxifen from ~ 04-2010 thru 08-2015. She has been on xeloda from 09-25-15  thru 03-11-16.  Primary breast cancers were bilateral when patient was age 70 still premenopausal. 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 BRCA negative.   Patient continues to feel at fairly good baseline, cannot tell any difference since off xeloda for 2 weeks. 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 She has some chronic low back discomfort, but not in upper or mid back. She denies increased SOB, cough, chest pain. Appetite is good, bowels move regularly without diarrhea, no mucositis, no nausea or vomiting, no LE swelling, no fever or symptoms of infection.  Remainder of 10 point Review of Systems negative.    No PAC Flu vaccine by PCP 12-2015 Genetics testing 2010, negative BRCA. CA 2729 was 71 in 04-2010 and 168 as baseline for xeloda 09-21-15.   ONCOLOGIC HISTORY Chemotherapy and radiation therapy information from 1997-98 as well as medical oncology note from 11-12-2008 out of [place] [facility] EMR scanned into EPIC.  Bilateral stage 3 node negative, ER PR negative breast cancers treated with bilateral mastectomies and bilateral axillary node dissections by Dr  Andrey Campanile 11-20- 1997 at age 40 and premenopausal. Full path report not included in this information, however information in note gives path [phone] adenocarcinoma moderately well differentiated 5.5 cm on right, no lymphatic or vascular invasion and on left 6 cm well differentiated , no lymphatic invasion, DNA index 1.09 and "ER PR receptors reported negative". She received 6 cycles of CMF chemotherapy from 05-15-96 thru 08-13-96 by Dr Tressie Stalker. She had radiation by Dr Nila Nephew 5040 cGy to bilateral chest wall with scars to 5940 cGy and bilateral supraclavicular regions to 4140 cGy, given 10-14-96 thru 11-29-96.She had PAC for the adjuvant chemotherapy. 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 She was begun on letrozole at that time.Note CA 2729 was elevated at time of recurrent disease. She continued letrozole from ~ 04-2010 until rising CA 2729 in 03-2015, with hormonal blocker changed to tamoxifen 04-05-15. CT CAP 08-25-15 had new single liver lesion, biopsied by IR on 09-11-15, confirming metastatic breast. Tamoxifen was [facility] and she began xeloda 09-25-15 at 1500 mg bid x 14 days every 21 days.CT abdomen 01-05-16 still solitary liver met 2.5 x 2.1 cm, compared with 2.1 x 1.6 cm by scan one month prior to starting xeloda  Stage II endometroid endometrial carcinoma and stage IC endometroid ovarian cancer at [facility] omentectomy Jan 2010. She received 6 cycles adjuvant taxol carboplatin thru Nov 2010 and vaginal cuff brachytherapy completed Jan 2011. Note CA125 was elevated at presentation. PAC used for chemotherapy.  Melanoma upper mid back treated with wide local excision 02-2012 ( that  path not located for thisEMR)   Objective:  Vital signs in last 24 hours:  BP (!) 149/74 (BP Location: Right Arm, Patient Position: Sitting)   Pulse 68   Temp 98.6 F (37 C)  (Oral)   Resp 18   Ht _0  (1.6 m)   Wt 227 lb (103 kg)   SpO2 96%   BMI 40.21 kg/m  Weight stable Alert, oriented and appropriate. Ambulatory without difficulty  HEENT:PERRL, sclerae not icteric. Oral mucosa moist without lesions, posterior pharynx clear.  Lymphatics:no cervical,supraclavicular, axillary adenopathy Resp: decreased BS with dullness left base otherwise clear to auscultation bilaterally and normal percussion bilaterally Cardio: regular rate and rhythm. No gallop. GI: abdomen obese, soft, nontender, cannot appreciate mass or organomegaly. Normally active bowel sounds. Musculoskeletal/ Extremities: Spine not tender. LE / UE without pitting edema, cords, tenderness Neuro: no peripheral neuropathy. Otherwise nonfocal. PSYCH appropriate mood and affect Skin  Palms unremarkable, otherwise without rash, ecchymosis, petechiae. No skin changes in area of melanoma excision upper back.T spine not tender to palpation. Bilateral mastectomies without evidence of local recurrence  Lab Results:  Results for orders placed or performed in visit on 03/24/16  CBC with Differential  Result Value Ref Range   WBC 7.9 3.9 - 10.3 10e3/uL   NEUT# 4.7 1.5 - 6.5 10e3/uL   HGB 13.7 11.6 - 15.9 g/dL   HCT 41.2 34.8 - 46.6 %   Platelets 153 145 - 400 10e3/uL   MCV 100.4 79.5 - 101.0 fL   MCH 33.5 25.1 - 34.0 pg   MCHC 33.3 31.5 - 36.0 g/dL   RBC 4.10 3.70 - 5.45 10e6/uL   RDW 18.6 (H) 11.2 - 14.5 %   lymph# 2.2 0.9 - 3.3 10e3/uL   MONO# 0.7 0.1 - 0.9 10e3/uL   Eosinophils Absolute 0.2 0.0 - 0.5 10e3/uL   Basophils Absolute 0.1 0.0 - 0.1 10e3/uL   NEUT% 60.5 38.4 - 76.8 %   LYMPH% 27.9 14.0 - 49.7 %   MONO% 8.8 0.0 - 14.0 %   EOS% 2.1 0.0 - 7.0 %   BASO% 0.7 0.0 - 2.0 %  Comprehensive metabolic panel  Result Value Ref Range   Sodium 145 136 - 145 mEq/L   Potassium 4.8 3.5 - 5.1 mEq/L   Chloride 106 98 - 109 mEq/L   CO2 28 22 - 29 mEq/L   Glucose 85 70 - 140 mg/dl   BUN 15.6 7.0 -  26.0 mg/dL   Creatinine 0.7 0.6 - 1.1 mg/dL   Total Bilirubin 1.32 (H) 0.20 - 1.20 mg/dL   Alkaline Phosphatase 61 40 - 150 U/L   AST 28 5 - 34 U/L   ALT 15 0 - 55 U/L   Total Protein 6.9 6.4 - 8.3 g/dL   Albumin 3.7 3.5 - 5.0 g/dL   Calcium 9.7 8.4 - 10.4 mg/dL   Anion Gap 11 3 - 11 mEq/L   EGFR 81 (L) >90 ml/min/1.73 m2     Studies/Results: NUCLEAR MEDICINE PET SKULL BASE TO THIGH  TECHNIQUE: 11.3 mCi F-18 FDG was injected intravenously. Full-ring PET imaging was performed from the skull base to thigh after the radiotracer. CT data was obtained and used for attenuation correction and anatomic localization.  FASTING BLOOD GLUCOSE:  Value: 172 mg/dl  COMPARISON:  12/03/2014 PET-CT.  Abdomen CT 01/05/2016.  FINDINGS: NECK  No hypermetabolic lymph nodes in the neck.  CHEST  Hypermetabolism associated with the irregular pleural thickening in the left lung base has progressed slightly  in the interval with representative SUV max 8.7. No evidence for mediastinal or hilar hypermetabolism. Surgical clips noted in the axillary regions bilaterally. Patient is status post bilateral mastectomy.  ABDOMEN/PELVIS  1.8 cm lesion posterior hepatic dome (image 78 series 4) demonstrates hypermetabolism with SUV max = 8.9. Second posterior right hepatic lesion measuring 2.0 cm (image 85 series 4) shows a lower level FDG uptake with SUV max = 5.4.  Diffuse low-attenuation of the liver parenchyma is compatible with fatty deposition. Patient is status post ventral mesh placement. 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 Diverticular changes noted left colon without diverticulitis. Bilateral groin hernias contain only fat.  SKELETON  Scattered hypermetabolic lesions in the axial skeleton compatible with metastatic disease. Representative lesion in the right aspect of the T11 vertebral body demonstrates SUV max = 5.0. No underlying CT  abnormality is evident at this level.  IMPRESSION: 1. Persistent hypermetabolism associated with the irregular pleural thickening seen posteriorly in the lower left hemi thorax. 2. Markedly hypermetabolic lesion in the posterior hepatic dome, consistent with biopsy proven metastatic disease. 3. Scattered hypermetabolic foci identified in the axial skeleton consistent with bony metastatic involvement.  PACs images reviewed by MD  Medications: I have reviewed the patient's current medications. Stop xeloda Plan Faslodex and Ibrance Likely will also need xgeva  DISCUSSION  Information from PET reviewed, including second area in right posterior liver and some bone involvement, and slight increase in hypermetabolic activity left lung base. Will discontinue xeloda. Given previous long benefit from letrozole and basically asymptomatic and fairly limited metastatic involvement now, have recommended Faslodex with Ibrance. Discussed schedules and administration of those agents. Patient was unable to stay for RN/ pharmacist teaching today, however I will ask [place] oral chemo pharmacists to follow up. Will begin these as soon as authorization / when able to obtain.  Patient understands that there are other chemo options if needed. Note she would need PAC for chemo  I mentioned bone agent only briefly. Delton See will be most useful as she has no central line, bilateral breast ca with bilateral radiation so UE venous access compromised.  Patient has given verbal consent for Faslodex and Leslee Home, tho does need full teaching for these by nursing or pharmacy in addition to MD information discussed now.  We have not discussed change to another medical oncologist in Jan.    Assessment/Plan: 1.Metastatic breast cancer: progression on Xeloda, now 2 areas in liver and bone involvement, basically asymptomatic. Change treatment to Faslodex + Ibrance, add xgeva.  History is bilateral breast cancers 1997 treated  with bilateral mastectomies and axillary node dissections, adjuvant chemotherapy and radiation. 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 On Letrozole from 04-2010 until 03-2015 changed to tamoxifen due to increasing marker, tho PET CT 11-2013 did not show imaging correlation. BRCA reportedly normal. CT CAP 08-25-15 stable except new solitary liver lesion, biopsy showing metastatic ER PR + breast. Xeloda used 09-25-15 thru 03-11-16. Some progression in liver and bone by PET 03-22-16.  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 CA 125 stable in normal range. She has yearly follow up with Dr Skeet Latch in 05-2016 3. Melanoma upper back 2013, treated with wide excision. Followed by dermatology. That pathology not in this EMR tho some information obtained from Dr Allyson Sabal 4. L5-S1 spine injections x 3 by  Dr Mina Marble ~ spring 2017 Sclerotic lesion in left humeral head stable baxk to 02-2013. 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 Weight loss due to improvement in eating habits and increased exercise would be beneficial. 7.normal bone density 03-2015. Arthralgias in hands better off aromatase inhibitor  8.type 2 DM: per patient, Hgb A1c up some 12-2015 by Dr [name]  All questions answered. Message sent to [facility] oral chemo pharmacist and collaborative RN. Faslodex and and Ibrance orders worked thru [employer], xgeva orders placed separately from Via with message sent to [facility] Via support in this regard. Message to managed care for Faslodex and xgeva. Time spent 40 min including >50% counseling and coordination of care. Route Dr Tamala Julian    Evlyn Clines, MD   03/24/2016,  5:33 PM

## 2016-03-25 LAB — CANCER ANTIGEN 27.29: CA 27.29: 138.5 U/mL — ABNORMAL HIGH (ref 0.0–38.6)

## 2016-03-27 ENCOUNTER — Encounter: Payer: Self-pay | Admitting: Oncology

## 2016-03-27 DIAGNOSIS — Z85820 Personal history of malignant melanoma of skin: Secondary | ICD-10-CM | POA: Insufficient documentation

## 2016-03-27 DIAGNOSIS — I878 Other specified disorders of veins: Secondary | ICD-10-CM | POA: Insufficient documentation

## 2016-03-27 DIAGNOSIS — C782 Secondary malignant neoplasm of pleura: Secondary | ICD-10-CM

## 2016-03-27 DIAGNOSIS — Z8543 Personal history of malignant neoplasm of ovary: Secondary | ICD-10-CM | POA: Insufficient documentation

## 2016-03-27 DIAGNOSIS — C7951 Secondary malignant neoplasm of bone: Secondary | ICD-10-CM

## 2016-03-27 DIAGNOSIS — C50919 Malignant neoplasm of unspecified site of unspecified female breast: Secondary | ICD-10-CM | POA: Insufficient documentation

## 2016-03-27 DIAGNOSIS — C787 Secondary malignant neoplasm of liver and intrahepatic bile duct: Secondary | ICD-10-CM

## 2016-03-27 DIAGNOSIS — Z8542 Personal history of malignant neoplasm of other parts of uterus: Secondary | ICD-10-CM | POA: Insufficient documentation

## 2016-03-27 DIAGNOSIS — C50912 Malignant neoplasm of unspecified site of left female breast: Secondary | ICD-10-CM | POA: Insufficient documentation

## 2016-03-27 NOTE — Progress Notes (Signed)
Medical Oncology  Scheduling message sent:  Faslodex 11-16, 11-30, 12-14. She prefers late morning on Thursdays, please confirm times with patient directly. LL 11-30 at 1230 + lab Xgeva also 11-30 after MD   L.Marko Plume, MD

## 2016-03-27 NOTE — Progress Notes (Signed)
START ON PATHWAY REGIMEN - Breast  [id number]: Palbociclib 125 mg D1-21 + Fulvestrant 500 mg q28 Days Until Progression or Unacceptable Toxicity   A cycle is every 28 days.:     Palbociclib (Ibrance(R)) 125 mg administer orally once daily with food for 21 days followed by 7 days off treatment. Dose Mod: None     Fulvestrant (Faslodex(R)) 500 mg flat dose slow IM injection into the buttocks (as two-29m injections), on days 1 and 15 for cycle 1 ONLY Dose Mod: None     Fulvestrant (Faslodex(R)) 500 mg flat dose slow IM injection into the buttocks (as two-570minjections), q28 days starting with cycle 2 Dose Mod: None Additional Orders: Avoid concomitant use of strong and moderate CYP3A inhibitors and inducers. If interaction unavoidable, see palbociclib prescribing information for recommended dosing adjustments. CBC recommended on day 1 of each cycle, and day 14 of  first two cycles, then as clinically indicated. Study found 62% incidence of grade 3 and 4 neutropenia.  **Always confirm dose/schedule in your pharmacy ordering system**    Patient Characteristics: Metastatic Hormonal, Postmenopausal, Third Line and Beyond AJCC Stage Grouping: IV Current Disease Status: Distant Metastases AJCC M Stage: X ER Status: Positive (+) AJCC N Stage: X AJCC T Stage: X HER2/neu: Negative (-) PR Status: Positive (+) Menopausal Status? Postmenopausal Line of therapy: Third Line and Beyond Would you be surprised if this patient died  in the next year? I would be surprised if this patient died in the next year  Intent of Therapy: Non-Curative / Palliative Intent, Discussed with Patient

## 2016-03-28 ENCOUNTER — Other Ambulatory Visit: Payer: Self-pay

## 2016-03-28 ENCOUNTER — Encounter: Payer: Self-pay | Admitting: Pharmacist

## 2016-03-28 DIAGNOSIS — C569 Malignant neoplasm of unspecified ovary: Secondary | ICD-10-CM

## 2016-03-28 DIAGNOSIS — C50919 Malignant neoplasm of unspecified site of unspecified female breast: Secondary | ICD-10-CM

## 2016-03-28 MED ORDER — PALBOCICLIB 125 MG PO CAPS: 125.0000 mg | ORAL_CAPSULE | Freq: Every day | ORAL | 0 refills | Status: DC

## 2016-03-28 NOTE — Progress Notes (Signed)
Oral Chemotherapy Pharmacist Encounter New prescription for Leslee Home has been sent to the [facility] outpatient pharmacy for benefit analysis and approval.   Will follow up with patient regarding insurance and pharmacy. Will follow up in 1-2 weeks for adherence and toxicity management.   Thank you, Kennith Center, Pharm.D., CPP 03/28/2016'@4'$ :15 PM Oral Chemotherapy Clinic

## 2016-03-28 NOTE — Progress Notes (Signed)
Addendum: Note potential drug interaction between Palbociclib & Simvastatin--> Palbo may increase serum conc of simvastatin. Recommend monitor therapy for increase in clinical effects/adverse effects of simvastatin. No other drug interactions detected. Kennith Center, Pharm.D., CPP 03/28/2016'@4'$ :18 PM

## 2016-03-28 NOTE — Progress Notes (Signed)
Brittany Porter in Oral chemotherapy clinic prescription for Ibrance.

## 2016-03-29 ENCOUNTER — Telehealth: Payer: Self-pay | Admitting: Pharmacist

## 2016-03-29 ENCOUNTER — Telehealth: Payer: Self-pay | Admitting: Oncology

## 2016-03-29 NOTE — Telephone Encounter (Signed)
Oral Chemotherapy Pharmacist Education - Ibrance  Brittany Porter is a 70 yo female diagnosed with HR+ metastatic breast cancer. Called patient on 03/29/2016 to discuss new medication, Ibrance.  - Start Date: Has not received medication from pharmacy yet. - Administration/Adherence: '125mg'$  PO daily with food on days 1-21 of 28 day cycle. Patient verbalized understanding of taking medication on a cycle, and stated she was familiar with this concept from taking capecitabine in the past.  - Adverse Effects: Discussed that side effects could include but are not limited to low WBC and RBC counts, infection, diarrhea, NV (has ondansetron at home if needed), fatigue, rash, and hair loss.  - Drug Interactions: Drug interaction noted with simvastatin and Ibrance based off of patient's current Epic med list. Told patient to let us know if she develops any muscle aches/pains suggestive of too much simvastatin exposure. Also discussed importance of avoiding grapefruit juice. - Handling: Patient understands to use caution while handling oral chemotherapy and to keep away from other family members and friends.   [name] understood what we discussed today about her new medication, and was given the phone number of oral chemotherapy pharmacist to call with any additional questions or concerns that may arise.   Demetrius Charity, PharmD Acute Care Pharmacy Resident  Pager: [phone] 03/29/2016

## 2016-03-29 NOTE — Telephone Encounter (Signed)
lvm to inform pt of 11/16 and 11/30 appt date/times per LOS

## 2016-03-30 ENCOUNTER — Telehealth: Payer: Self-pay

## 2016-03-30 NOTE — Telephone Encounter (Signed)
Told Ms Nodal that she will only receive the Faslodex injection tomorrow as noted below by Dr. Marko Plume. Gave her appointment time for 04-14-16 at 3:30 pm lab and then visitat 4 pm.  The nurse at the desk will need to give the Faslodex injection.

## 2016-03-30 NOTE — Telephone Encounter (Signed)
LM for patient to call back to day to discuss Xgeva and [employer]

## 2016-03-30 NOTE — Telephone Encounter (Addendum)
Reviewed Faslodex and Xgeva side effects as written in the medication handouts given at 03-24-16 appointment.  Reviewed drinking Gatorade 3-4 (8oz) glasses a day in addition to other fluids  for 3 days beginning the day prior to the injection  as this helps with the washed out feeling from the medication.  Brittany Porter has full dentures and her gums are currently in good condition.

## 2016-03-30 NOTE — Telephone Encounter (Signed)
RE: Delton See and 11-30 visit  Received: Today  Message Contents  Gordy Levan, MD  Baruch Merl, RN        Just faslodex alone for first time  4:00 fine   Thank you

## 2016-03-30 NOTE — Telephone Encounter (Signed)
-----   Message from Tora Kindred, [facility] sent at 03/28/2016  4:15 PM EST ----- Regarding: RE: Faslodex, xgeva Leslee Home) New Rx faxed to [facility] today (11/13) for benefit analysis, etc. Pt will need counseling on Ibrance. Kennith Center, Pharm.D., CPP 03/28/2016'@4'$ :16 PM   ----- Message ----- From: Gordy Levan, MD Sent: 03/27/2016   9:18 AM To: Tora Kindred, RPH, Baruch Merl, RN, # Subject: Faslodex, xgeva Leslee Home)                      Stopping xeloda due to progression.  Juliann Pulse may have given written information on faslodex and ibrance -? but pateint did not have teaching by RN on 11-9  RN please coordinate teaching for faslodex and xgeva, by phone or  with oral chemo pharmacist, whom I am asking to help with the Ibrance - oral pharmacist or RN also will need to do full teaching for Ibrance please.  RN also tell patient that I think best to go ahead with bone treatments once monthly - I had mentioned possibility at visit. Delton See will be best as it doesn't require IV.  Be sure she is up to date on dental exams and nothing of concern with teeth.   (I will copy my scheduling message into document note in chart)  Thank you!

## 2016-03-31 ENCOUNTER — Telehealth: Payer: Self-pay

## 2016-03-31 ENCOUNTER — Ambulatory Visit (HOSPITAL_BASED_OUTPATIENT_CLINIC_OR_DEPARTMENT_OTHER): Payer: Medicare Other

## 2016-03-31 VITALS — BP 145/76 | HR 93 | Temp 98.2°F | Resp 20

## 2016-03-31 DIAGNOSIS — Z5111 Encounter for antineoplastic chemotherapy: Secondary | ICD-10-CM

## 2016-03-31 DIAGNOSIS — C50919 Malignant neoplasm of unspecified site of unspecified female breast: Secondary | ICD-10-CM | POA: Diagnosis not present

## 2016-03-31 MED ORDER — FULVESTRANT 250 MG/5ML IM SOLN
500.0000 mg | Freq: Once | INTRAMUSCULAR | Status: AC
  Administered 2016-03-31: 500 mg via INTRAMUSCULAR
  Filled 2016-03-31: qty 10

## 2016-03-31 NOTE — Patient Instructions (Signed)

## 2016-03-31 NOTE — Telephone Encounter (Signed)
Pt called with question about her orthopedic appt on Monday. Called pt back and her orthopedic MD may be giving her steroid shot in knee and/or foot. She was making sure this was OK. Informed her this would be OK but to also let MD know what she is taking here at [facility]. Faslodex shot today.

## 2016-04-04 ENCOUNTER — Telehealth: Payer: Self-pay

## 2016-04-04 ENCOUNTER — Telehealth: Payer: Self-pay | Admitting: Pharmacist

## 2016-04-04 NOTE — Telephone Encounter (Signed)
Oral Chemotherapy Pharmacist Encounter  Received notification from [place] that patient's Ibrance prescription would require prior authorization. PA submitted on [email or web address], status is pending Key [id number] Determination may take up to 72hours.  Oral Chemo Clinic will continue to follow.  [name], PharmD, BCPS 04/04/2016  11:31 AM Oral Chemotherapy Clinic [phone]

## 2016-04-04 NOTE — Telephone Encounter (Signed)
Brittany Porter received her first Faslodex injections on  Thursday 03-31-16.  No pain at injection sites. She noticed that under her right  eye was swollen.  It has decreased in swelling as of today. Afebrile. She thought it might be from the Faslodex injection. Told her that Dr. Marko Plume did not feel that it is a reaction to the Faslodex. Dr. Marko Plume said to apply warm compresses to the right eye until swelling resolved. Told Brittany Porter that the Brittany Porter tablets have to be prior authorized per [name] pharmacist's note today in [facility].

## 2016-04-06 NOTE — Telephone Encounter (Signed)
Oral Chemotherapy Pharmacist Encounter  Received fax from [employer] with clinical questions for prior authorization of Ibrance. Form completed and faxed back to [phone] for specialty PAs on 04/05/16 at 4:46pm.  Oral Chemo Clinic will continue to follow.  [name], PharmD, BCPS 04/06/2016  8:17 AM Oral Chemotherapy Clinic [phone]

## 2016-04-08 MED FILL — IBRANCE 125 MG CAPSULE: 125 | 21 days supply | Qty: 21 | Fill #0

## 2016-04-08 NOTE — Telephone Encounter (Signed)
Oral Chemotherapy Pharmacist Encounter  I spoke with patient about Ibrance copayment issues. Patient requests for Oral Chemo Clinic to work on Sales promotion account executive assistance from [place].  Patient will call me on Monday 11/27 to discuss getting signature and income information to complete application, she is currently out of town.  I informed patient that we used voucher for 1st Ibrance fill to decrease delay in starting medication, they will call patient when Leslee Home is ready to be picked up.  Patient understands and is in agreement with plan.  [name], PharmD, BCPS 04/08/2016  2:02 PM Oral Chemotherapy Clinic [phone]

## 2016-04-08 NOTE — Telephone Encounter (Signed)
Oral Chemotherapy Pharmacist Encounter  Received notification from [place] that patient's Brittany Porter would have a $2900 copay, foundation assistance from [employer] obtained back in may 2017 would cover $1554.02 and patient would be responsible for $1369.82 remaining balance.  Patient has not yet met OOP or deductible and that is why this copayment is so high. Once this OOP is met, her copay will be much less, however, not until then.  I requested for [facility] to use free voucher for 1st Ibrance fill to buy [place] some tike to identify other means of copay assistance. There are currently no funds available from any foundations to help with copayment so we will need to apply for manufacturer assistance through [place] if patient is not able to pay the ~$1400 this month.  I LVM for patient to call Oral Chemo Clinic to discuss options for assistance. I did not leave the actual amount of copay on voicemail.  Oral Chemo Clinic will continue to follow.  [name], PharmD, BCPS 04/08/2016  1:14 PM Oral Chemotherapy Clinic [phone]

## 2016-04-08 NOTE — Telephone Encounter (Signed)
Oral Chemotherapy Pharmacist Encounter  Received notification from [employer] that patient's PA for Leslee Home has been approved through 05/15/17. Ref# [id number].  I have alerted [facility]. Oral Chemo Clinic will follow up for copay issues, start date, adherence and toxicity management.  [name], PharmD, BCPS 04/08/2016  8:40 AM Oral Chemotherapy Clinic [phone]

## 2016-04-10 ENCOUNTER — Other Ambulatory Visit: Payer: Self-pay | Admitting: Oncology

## 2016-04-11 NOTE — Telephone Encounter (Signed)
Oral Chemotherapy Pharmacist Encounter  I spoke with patient this morning about [place] application. I will fax patient page 2 of application to sign to [phone]. Pt will sign application and fax back income documentation. Once this is all received I will fax completed application to [employer] Patient Assistance.  Oral Chemo Clinic will continue to follow.  [name], PharmD, BCPS, BCOP 04/11/2016  9:09 AM Oral Chemotherapy Clinic [phone]

## 2016-04-14 ENCOUNTER — Ambulatory Visit: Payer: Medicare Other | Admitting: Oncology

## 2016-04-14 ENCOUNTER — Ambulatory Visit: Payer: Medicare Other

## 2016-04-14 ENCOUNTER — Other Ambulatory Visit: Payer: Medicare Other

## 2016-04-14 ENCOUNTER — Encounter: Payer: Self-pay | Admitting: Oncology

## 2016-04-14 ENCOUNTER — Ambulatory Visit (HOSPITAL_BASED_OUTPATIENT_CLINIC_OR_DEPARTMENT_OTHER): Payer: Medicare Other | Admitting: Oncology

## 2016-04-14 ENCOUNTER — Other Ambulatory Visit (HOSPITAL_BASED_OUTPATIENT_CLINIC_OR_DEPARTMENT_OTHER): Payer: Medicare Other

## 2016-04-14 VITALS — BP 159/91 | HR 87 | Temp 98.3°F | Resp 20 | Wt 227.4 lb

## 2016-04-14 DIAGNOSIS — Z79899 Other long term (current) drug therapy: Secondary | ICD-10-CM

## 2016-04-14 DIAGNOSIS — R197 Diarrhea, unspecified: Secondary | ICD-10-CM

## 2016-04-14 DIAGNOSIS — Z8542 Personal history of malignant neoplasm of other parts of uterus: Secondary | ICD-10-CM

## 2016-04-14 DIAGNOSIS — Z5111 Encounter for antineoplastic chemotherapy: Secondary | ICD-10-CM

## 2016-04-14 DIAGNOSIS — C787 Secondary malignant neoplasm of liver and intrahepatic bile duct: Secondary | ICD-10-CM | POA: Diagnosis not present

## 2016-04-14 DIAGNOSIS — C782 Secondary malignant neoplasm of pleura: Secondary | ICD-10-CM

## 2016-04-14 DIAGNOSIS — C50912 Malignant neoplasm of unspecified site of left female breast: Secondary | ICD-10-CM

## 2016-04-14 DIAGNOSIS — C50919 Malignant neoplasm of unspecified site of unspecified female breast: Secondary | ICD-10-CM

## 2016-04-14 DIAGNOSIS — C7951 Secondary malignant neoplasm of bone: Secondary | ICD-10-CM | POA: Diagnosis not present

## 2016-04-14 DIAGNOSIS — Z85820 Personal history of malignant melanoma of skin: Secondary | ICD-10-CM

## 2016-04-14 DIAGNOSIS — Z8543 Personal history of malignant neoplasm of ovary: Secondary | ICD-10-CM

## 2016-04-14 LAB — CBC WITH DIFFERENTIAL/PLATELET
BASO%: 0.3 % (ref 0.0–2.0)
Basophils Absolute: 0 10*3/uL (ref 0.0–0.1)
EOS%: 2.2 % (ref 0.0–7.0)
Eosinophils Absolute: 0.2 10*3/uL (ref 0.0–0.5)
HCT: 41.1 % (ref 34.8–46.6)
HGB: 13.8 g/dL (ref 11.6–15.9)
LYMPH%: 30.2 % (ref 14.0–49.7)
MCH: 32.5 pg (ref 25.1–34.0)
MCHC: 33.6 g/dL (ref 31.5–36.0)
MCV: 96.9 fL (ref 79.5–101.0)
MONO#: 0.7 10*3/uL (ref 0.1–0.9)
MONO%: 9.4 % (ref 0.0–14.0)
NEUT#: 4.6 10*3/uL (ref 1.5–6.5)
NEUT%: 57.9 % (ref 38.4–76.8)
Platelets: 151 10*3/uL (ref 145–400)
RBC: 4.24 10*6/uL (ref 3.70–5.45)
RDW: 14.5 % (ref 11.2–14.5)
WBC: 7.9 10*3/uL (ref 3.9–10.3)
lymph#: 2.4 10*3/uL (ref 0.9–3.3)

## 2016-04-14 LAB — COMPREHENSIVE METABOLIC PANEL
ALT: 19 U/L (ref 0–55)
AST: 22 U/L (ref 5–34)
Albumin: 3.3 g/dL — ABNORMAL LOW (ref 3.5–5.0)
Alkaline Phosphatase: 72 U/L (ref 40–150)
Anion Gap: 10 mEq/L (ref 3–11)
BUN: 13.2 mg/dL (ref 7.0–26.0)
CO2: 30 mEq/L — ABNORMAL HIGH (ref 22–29)
Calcium: 9.8 mg/dL (ref 8.4–10.4)
Chloride: 104 mEq/L (ref 98–109)
Creatinine: 0.8 mg/dL (ref 0.6–1.1)
EGFR: 74 mL/min/{1.73_m2} — ABNORMAL LOW (ref >90)
Glucose: 149 mg/dl — ABNORMAL HIGH (ref 70–140)
Potassium: 4.9 mEq/L (ref 3.5–5.1)
Sodium: 144 mEq/L (ref 136–145)
Total Bilirubin: 1.11 mg/dL (ref 0.20–1.20)
Total Protein: 6.6 g/dL (ref 6.4–8.3)

## 2016-04-14 MED ORDER — FULVESTRANT 250 MG/5ML IM SOLN
500.0000 mg | Freq: Once | INTRAMUSCULAR | Status: AC
  Administered 2016-04-14: 500 mg via INTRAMUSCULAR
  Filled 2016-04-14: qty 10

## 2016-04-14 NOTE — Progress Notes (Signed)
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   She is to see Dr Skeet Latch on 06-13-16, in scheduled follow up of endometrial and ovarian cancers, on observation and not known active.   Patient remains essentially asymptomatic from the metastatic breast cancer. She tolerated first Faslodex without any problems, tho did have localized hives under right eye evening of first injection, without other symptoms. Following first dose of Ibrance on 04-12-16, she developed loose stools which have continued 1-2x daily since then. She has slight irritated area at left gum, seems related to poorly fitting dentures which she has kept out and is using mouthwash. She denies abdominal pain, fever, blood, N/V. She is eating and drinking as usual, has not needed anything for the loose stools, is not taking any other medications to cause this. She denies SOB, cough, chest pain, back pain, excessive fatigue, swelling LE, any bleeding. She had steroid injection in right ankle and right knee since she was here last.  Remainder of 10 point Review of Systems negative.    No PAC Flu vaccine by PCP 12-2015 Genetics testing 2010, negative BRCA. CA 2729 was 71 in 04-2010 and 168 as baseline for xeloda 09-21-15.   Son and grandson with viral URIs last week.   ONCOLOGIC HISTORY Chemotherapy and radiation therapy information from 1997-98 as well as medical oncology note from 11-12-2008 out of [place] EMR scanned into EPIC.  Bilateral stage 3 node negative, ER PR negative breast cancers treated with bilateral mastectomies and bilateral axillary  node dissections by Dr Andrey Campanile 11-20- 1997 at age 37 and premenopausal. Full path report not included in this information, however information in note gives path [id number] adenocarcinoma moderately well differentiated 5.5 cm on right, no lymphatic or vascular invasion and on left 6 cm well differentiated , no lymphatic invasion, DNA index 1.09 and "ER PR receptors reported negative". She received 6 cycles of CMF chemotherapy from 05-15-96 thru 08-13-96 by Dr Tressie Stalker. She had radiation by Dr Nila Nephew 5040 cGy to bilateral chest wall with scars to 5940 cGy and bilateral supraclavicular regions to 4140 cGy, given 10-14-96 thru 11-29-96.She had PAC for the adjuvant chemotherapy. 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 She was begun on letrozole at that time.Note CA 2729 was elevated at time of recurrent disease. She continued letrozole from ~ 04-2010 until rising CA 2729 in 03-2015, with hormonal blocker changed to tamoxifen 04-05-15. CT CAP 08-25-15 had new single liver lesion, biopsied by IR on 09-11-15, confirming metastatic breast. Tamoxifen was [facility] and she began xeloda 09-25-15 at 1500 mg bid x 14 days every 21 days.CT abdomen 01-05-16 still solitary liver met 2.5 x 2.1 cm, compared with 2.1 x 1.6 cm by scan one month prior to starting xeloda. She continued xeloda thru 03-11-16, with PET 03-22-16 showing progression in liver and bone. She began Faslodex with Ibrance 03-2016.  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 She received 6 cycles adjuvant taxol carboplatin thru Nov 2010 and  vaginal cuff brachytherapy completed Jan 2011. Note CA125 was elevated at presentation. PAC used for chemotherapy.  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 Alert, oriented and appropriate. Ambulatory with cane, did not attempt onto table for exam..   HEENT:PERRL, sclerae not icteric. Oral mucosa moist without lesions, posterior pharynx clear.  Neck supple. No JVD.  Lymphatics:no cervical,supraclavicular, axillary or inguinal adenopathy Resp: decreased BS and dullness left base as previously, otherwise clear to auscultation bilaterally and normal percussion bilaterally Cardio: regular rate and rhythm. No gallop. RW:ERXVQMG obese,  soft, nontender, no appreciable mass or organomegaly. Normally active bowel sounds. Surgical incision not remarkable. Musculoskeletal/ Extremities: LE without pitting edema, cords, tenderness Neuro: nonfocal  PSYCH appropriate mood and affect Skin without rash, ecchymosis, petechiae   Lab Results:  Results for orders placed or performed in visit on 04/14/16  CBC with Differential  Result Value Ref Range   WBC 7.9 3.9 - 10.3 10e3/uL   NEUT# 4.6 1.5 - 6.5 10e3/uL   HGB 13.8 11.6 - 15.9 g/dL   HCT 41.1 34.8 - 46.6 %   Platelets 151 145 - 400 10e3/uL   MCV 96.9 79.5 - 101.0 fL   MCH 32.5 25.1 - 34.0 pg   MCHC 33.6 31.5 - 36.0 g/dL   RBC 4.24 3.70 - 5.45 10e6/uL   RDW 14.5 11.2 - 14.5 %   lymph# 2.4 0.9 - 3.3 10e3/uL   MONO# 0.7 0.1 - 0.9 10e3/uL   Eosinophils Absolute 0.2 0.0 - 0.5 10e3/uL   Basophils Absolute 0.0 0.0 - 0.1 10e3/uL   NEUT% 57.9 38.4 - 76.8 %   LYMPH% 30.2 14.0 - 49.7 %   MONO% 9.4 0.0 - 14.0 %   EOS% 2.2 0.0 - 7.0 %   BASO% 0.3 0.0 - 2.0 %  Comprehensive metabolic panel  Result Value Ref Range   Sodium 144 136 - 145 mEq/L   Potassium 4.9 3.5 - 5.1 mEq/L   Chloride 104 98 - 109 mEq/L   CO2 30 (H) 22 - 29 mEq/L   Glucose 149 (H) 70 -  140 mg/dl   BUN 13.2 7.0 - 26.0 mg/dL   Creatinine 0.8 0.6 - 1.1 mg/dL   Total Bilirubin 1.11 0.20 - 1.20 mg/dL   Alkaline Phosphatase 72 40 - 150 U/L   AST 22 5 - 34 U/L   ALT 19 0 - 55 U/L   Total Protein 6.6 6.4 - 8.3 g/dL   Albumin 3.3 (L) 3.5 - 5.0 g/dL   Calcium 9.8 8.4 - 10.4 mg/dL   Anion Gap 10 3 - 11 mEq/L   EGFR 74 (L) >90 ml/min/1.73 m2     Studies/Results:  No results found.  Medications: I have reviewed the patient's current medications. Continue Ibrance at 125 mg for now, to use thru Dec 18 for cycle 1 (x21 days q 28 days) Faslodex today (day 8 load) and 12-14 (day 15 load) then monthly.  I cannot tell from EMR if xgeva has been preauthorized, message sent to managed care.    DISCUSSION Timing of diarrhea with start of Ibrance, only  1-2x / 24 hrs now and seems to be tolerating adequately. Patient instructed to call if >=4 stools, large amounts of diarrhea or other concerns with this. She knows to push po fluids.   Patient is anxious about very high cost of Ibrance, with first month's supply obtained on a voucher and [facility] pharmacist actively requesting additional assistance  DId not discuss different medical oncologist after Jan.  Assessment/Plan:  1.Metastatic breast cancer: progression on Xeloda, now 2 areas in liver and bone involvement,  asymptomatic. Treatment changed to Faslodex + Ibrance, beginning 11-16 and 04-12-16 respectively, add xgeva likely with next visit if preauthorized and otherwise stable. History of bilateral breast cancers 1997 treated with bilateral mastectomies and axillary node dissections, adjuvant chemotherapy and radiation. 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 On Letrozole from 04-2010 until 03-2015 changed to tamoxifen due to increasing marker, tho PET CT 11-2013 did not show imaging correlation. BRCA reportedly normal. CT CAP 08-25-15 stable except new solitary liver lesion, biopsy  showing metastatic ER PR + breast. Xeloda used 09-25-15 thru 03-11-16. 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 CA 125 stable in normal range. She has yearly follow up with Dr Skeet Latch in 05-2016 3. Melanoma upper back 2013, treated with wide excision. Followed by dermatology. That pathology not in this EMR tho some information obtained from Dr Allyson Sabal 4. 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 Weight loss due to improvement in eating habits and increased exercise would be beneficial. 7.normal bone density 03-2015. Arthralgias in hands better off aromatase inhibitor  8.type 2 DM: per patient, Hgb A1c up some 12-2015 by Dr [name]   All questions answered and she will call if diarrhea worsens or other concerns prior to next scheduled visit. Faslodex orders confirmed. Time spent 25 min including >50% counseling and coordination of care. Route Dr Tamala Julian   Evlyn Clines, MD   04/14/2016, 7:54 PM

## 2016-04-17 ENCOUNTER — Other Ambulatory Visit: Payer: Self-pay | Admitting: Oncology

## 2016-04-20 ENCOUNTER — Telehealth: Payer: Self-pay | Admitting: Pharmacist

## 2016-04-20 NOTE — Telephone Encounter (Signed)
Oral Chemotherapy Pharmacist Encounter  [place] [facility] patient assistance application for Ibrance for 2018 completed and faxed on 04/19/16.  This encounter will continue to be updated until final determination.  Oral Oncology Clinic will continue to follow.  [name], PharmD, BCPS, BCOP 04/20/2016  9:46 AM Oral Oncology Clinic [phone]

## 2016-04-26 ENCOUNTER — Telehealth: Payer: Self-pay

## 2016-04-26 NOTE — Telephone Encounter (Signed)
Brittany Porter was calling about her injection on Thursday-Faslodex.   She has a cold~ 1 week  and running a low grade temp 99 today and T-max 100.4. Whole familys has had this .  She thought she escaped it. She has a productive cough with yellow to clear phlegm. Using cough medicine with good effect.  Has not taken tylenol or ASA  for temp. Told her that the Faslodex in a hormone manipulation and does not effect her blood counts.She can discuss at visit 04-28-16. Told her that the Ibrance does effect counts.She has taken today's dose.   Told her that this information would be sent to Dr. Marko Plume to review to see if she wants to hold IBrance until visit 12-14.  LM for Oral Chemo Clinic as to the status of the patient assistance for Ibrance as pt was inquiring.(Application sent [phone])

## 2016-04-26 NOTE — Telephone Encounter (Signed)
  Novelle, Addair Female, 70 y.o., [birth year] Weight:  227 lb 6.4 oz (103.1 kg) Phone:  F:[phone] PCP:  [name], MD [place] MRN:  [id number] MyChart:  Active Next Appt:  04/28/2016 Message  Received: Today  Message Contents  Gordy Levan, MD  Baruch Merl, RN        Ms Aro was calling about her injection on Thursday-Faslodex.   She has a cold~ 1 week and running a low grade temp 99 today and T-max 100.4. Whole familys has had this . She thought she escaped it.  She has a productive cough with yellow to clear phlegm.  Using cough medicine with good effect.  Has not taken tylenol or ASA for temp.  Told her that the Faslodex in a hormone manipulation and does not effect her blood counts.She can discuss at visit 04-28-16.  Told her that the Ibrance does effect counts.She has taken today's dose.   Told her that this information would be sent to Dr. Marko Plume to review to see if she wants to hold IBrance until visit 12-14.    LM for Oral Chemo Clinic as to the status of the patient assistance for Ibrance as pt was inquiring.(Application sent [phone])    MD response: hold ibrance until we check CBC on 12-14. Call if fever prior   thanks

## 2016-04-26 NOTE — Telephone Encounter (Signed)
Told Brittany Porter to hold Brittany Porter as noted by Dr. Marko Plume below as well as to call for a Temp of 101.0 or greater prior to 04-28-16 appointment.

## 2016-04-27 ENCOUNTER — Other Ambulatory Visit: Payer: Self-pay | Admitting: Oncology

## 2016-04-27 DIAGNOSIS — C50919 Malignant neoplasm of unspecified site of unspecified female breast: Secondary | ICD-10-CM

## 2016-04-28 ENCOUNTER — Other Ambulatory Visit (HOSPITAL_BASED_OUTPATIENT_CLINIC_OR_DEPARTMENT_OTHER): Payer: Medicare Other

## 2016-04-28 ENCOUNTER — Ambulatory Visit (HOSPITAL_BASED_OUTPATIENT_CLINIC_OR_DEPARTMENT_OTHER): Payer: Medicare Other

## 2016-04-28 ENCOUNTER — Ambulatory Visit (HOSPITAL_BASED_OUTPATIENT_CLINIC_OR_DEPARTMENT_OTHER): Payer: Medicare Other | Admitting: Oncology

## 2016-04-28 VITALS — BP 129/69 | HR 87 | Temp 98.0°F | Resp 17 | Ht 63.0 in | Wt 221.4 lb

## 2016-04-28 DIAGNOSIS — C782 Secondary malignant neoplasm of pleura: Secondary | ICD-10-CM

## 2016-04-28 DIAGNOSIS — J069 Acute upper respiratory infection, unspecified: Secondary | ICD-10-CM

## 2016-04-28 DIAGNOSIS — C569 Malignant neoplasm of unspecified ovary: Secondary | ICD-10-CM

## 2016-04-28 DIAGNOSIS — C50919 Malignant neoplasm of unspecified site of unspecified female breast: Secondary | ICD-10-CM | POA: Diagnosis not present

## 2016-04-28 DIAGNOSIS — D696 Thrombocytopenia, unspecified: Secondary | ICD-10-CM

## 2016-04-28 DIAGNOSIS — D72819 Decreased white blood cell count, unspecified: Secondary | ICD-10-CM

## 2016-04-28 DIAGNOSIS — Z5111 Encounter for antineoplastic chemotherapy: Secondary | ICD-10-CM | POA: Diagnosis not present

## 2016-04-28 DIAGNOSIS — Z853 Personal history of malignant neoplasm of breast: Secondary | ICD-10-CM

## 2016-04-28 DIAGNOSIS — C7951 Secondary malignant neoplasm of bone: Secondary | ICD-10-CM

## 2016-04-28 DIAGNOSIS — C787 Secondary malignant neoplasm of liver and intrahepatic bile duct: Secondary | ICD-10-CM | POA: Diagnosis not present

## 2016-04-28 DIAGNOSIS — B9789 Other viral agents as the cause of diseases classified elsewhere: Secondary | ICD-10-CM

## 2016-04-28 DIAGNOSIS — Z8543 Personal history of malignant neoplasm of ovary: Secondary | ICD-10-CM

## 2016-04-28 DIAGNOSIS — C50912 Malignant neoplasm of unspecified site of left female breast: Secondary | ICD-10-CM

## 2016-04-28 LAB — COMPREHENSIVE METABOLIC PANEL
ALT: 19 U/L (ref 0–55)
AST: 26 U/L (ref 5–34)
Albumin: 3.1 g/dL — ABNORMAL LOW (ref 3.5–5.0)
Alkaline Phosphatase: 48 U/L (ref 40–150)
Anion Gap: 13 mEq/L — ABNORMAL HIGH (ref 3–11)
BUN: 16.2 mg/dL (ref 7.0–26.0)
CO2: 27 mEq/L (ref 22–29)
Calcium: 9.4 mg/dL (ref 8.4–10.4)
Chloride: 98 mEq/L (ref 98–109)
Creatinine: 0.8 mg/dL (ref 0.6–1.1)
EGFR: 76 mL/min/{1.73_m2} — ABNORMAL LOW (ref >90)
Glucose: 278 mg/dl — ABNORMAL HIGH (ref 70–140)
Potassium: 4.4 mEq/L (ref 3.5–5.1)
Sodium: 138 mEq/L (ref 136–145)
Total Bilirubin: 0.94 mg/dL (ref 0.20–1.20)
Total Protein: 6.9 g/dL (ref 6.4–8.3)

## 2016-04-28 LAB — CBC WITH DIFFERENTIAL/PLATELET
BASO%: 0 % (ref 0.0–2.0)
Basophils Absolute: 0 10*3/uL (ref 0.0–0.1)
EOS%: 0.4 % (ref 0.0–7.0)
Eosinophils Absolute: 0 10*3/uL (ref 0.0–0.5)
HCT: 36.6 % (ref 34.8–46.6)
HGB: 12.6 g/dL (ref 11.6–15.9)
LYMPH%: 33.7 % (ref 14.0–49.7)
MCH: 32.5 pg (ref 25.1–34.0)
MCHC: 34.4 g/dL (ref 31.5–36.0)
MCV: 94.3 fL (ref 79.5–101.0)
MONO#: 0.1 10*3/uL (ref 0.1–0.9)
MONO%: 4.9 % (ref 0.0–14.0)
NEUT#: 1.5 10*3/uL (ref 1.5–6.5)
NEUT%: 61 % (ref 38.4–76.8)
Platelets: 106 10*3/uL — ABNORMAL LOW (ref 145–400)
RBC: 3.88 10*6/uL (ref 3.70–5.45)
RDW: 15 % — ABNORMAL HIGH (ref 11.2–14.5)
WBC: 2.4 10*3/uL — ABNORMAL LOW (ref 3.9–10.3)
lymph#: 0.8 10*3/uL — ABNORMAL LOW (ref 0.9–3.3)

## 2016-04-28 MED ORDER — FULVESTRANT 250 MG/5ML IM SOLN
500.0000 mg | Freq: Once | INTRAMUSCULAR | Status: AC
  Administered 2016-04-28: 500 mg via INTRAMUSCULAR
  Filled 2016-04-28: qty 10

## 2016-04-28 MED ORDER — BENZONATATE 100 MG PO CAPS: 100.0000 mg | ORAL_CAPSULE | Freq: Three times a day (TID) | ORAL | 0 refills | Status: DC | PRN

## 2016-04-28 MED ORDER — FULVESTRANT 250 MG/5ML IM SOLN: 500.0000 mg | Freq: Once | INTRAMUSCULAR | Status: DC

## 2016-04-28 NOTE — Patient Instructions (Signed)

## 2016-04-28 NOTE — Progress Notes (Signed)
OFFICE PROGRESS NOTE   April 28, 2016   Physicians: Marcy Panning), [name], [name], Fanny Skates, [name] (PCP), Jeneen Rinks Kinard), [facility], S.Dalldorf/ Mina Marble of [employer]  INTERVAL HISTORY:  Patient is seen, alone for visit, in continuing attention to metastatic breast cancer involving pleura, liver and bone, for which she began Faslodex on 03-31-16 and Ibrance on 04-12-16. She is due day 44 Faslodex today. She held [place] last 2 days due to respiratory illness, as CBC to be done today. Delton See is approved, did not begin today due to respiratory illness, but will give next week if possible.  Patient had mild fatigue but overall tolerating Faslodex and Ibrance well, then caught respiratory infection from family members, ~ day 7 today and she is feeling a little better now. She has sinus congestion and drainage clear, cough with clear to slightly yellow sputum, Tmax 100.4 x 4 days now afebrile, no SOB, no chest pain, no ear symptoms, no GI symptoms. Appetite was fine prior to respiratory illness, no abdominal pain, no further diarrhea since she was seen on 04-14-16. Energy was at baseline prior to respiratory illness. No problems with PAC, no shaking chills.  She had acute pain in right knee going up steps earlier this week, saw Dr Novella Olive then and is using ice and walker,  and will follow up with him.  Remainder of 10 point Review of Systems negative.   3 family members ill with similar symptoms.   No PAC Flu vaccine by PCP 12-2015 Genetics testing 2010, negative BRCA. CA 2729 was 71 in 04-2010 and 168 as baseline for xeloda 09-21-15.   ONCOLOGIC HISTORY  Chemotherapy and radiation therapy information from 1997-98 as well as medical oncology note from 11-12-2008 out of [place] EMR scanned into EPIC.  Bilateral stage 3 node negative, ER PR negative breast cancers treated with bilateral mastectomies and bilateral axillary node dissections by Dr Andrey Campanile  11-20- 1997 at age 43 and premenopausal. Full path report not included in this information, however information in note gives path [id number] adenocarcinoma moderately well differentiated 5.5 cm on right, no lymphatic or vascular invasion and on left 6 cm well differentiated , no lymphatic invasion, DNA index 1.09 and "ER PR receptors reported negative". She received 6 cycles of CMF chemotherapy from 05-15-96 thru 08-13-96 by Dr Tressie Stalker. She had radiation by Dr Nila Nephew 5040 cGy to bilateral chest wall with scars to 5940 cGy and bilateral supraclavicular regions to 4140 cGy, given 10-14-96 thru 11-29-96.She had PAC for the adjuvant chemotherapy. 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 She was begun on letrozole at that time.Note CA 2729 was elevated at time of recurrent disease. She continued letrozole from ~ 04-2010 until rising CA 2729 in 03-2015, with hormonal blocker changed to tamoxifen 04-05-15. CT CAP 08-25-15 had new single liver lesion, biopsied by IR on 09-11-15, confirming metastatic breast. Tamoxifen was [facility] and she began xeloda 09-25-15 at 1500 mg bid x 14 days every 21 days.CT abdomen 01-05-16 still solitary liver met 2.5 x 2.1 cm, compared with 2.1 x 1.6 cm by scan one month prior to starting xeloda. She continued xeloda thru 03-11-16, with PET 03-22-16 showing progression in liver and bone. She began Faslodex with Ibrance 03-2016.  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 She received 6 cycles adjuvant taxol carboplatin thru Nov 2010  and vaginal cuff brachytherapy completed Jan 2011. Note CA125 was elevated at presentation. PAC used for chemotherapy.  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 Alert, oriented and appropriate. Ambulatory without rolling walker due to knee symptoms No alopecia  HEENT:PERRL, sclerae not icteric. Oral mucosa moist without lesions, posterior pharynx minimal erythema without exudate. Nasal turbinates boggy without purulent drainage.  Neck supple. No JVD.  Lymphatics:no cervical,suraclavicular, axillary or inguinal adenopathy Resp: decreased BS left base as previously, no wheezes/ crackles/ rales. Cardio: regular rate and rhythm. No gallop. GI: abdomen obese, soft, nontender, not distended, cannot appreciate mass or organomegaly. Normally active bowel sounds.  Musculoskeletal/ Extremities: Right knee probable swelling medially, no heat or erythema, otherwise LE without pitting edema, cords, tenderness Neuro:nonfocal Skin without rash, ecchymosis, petechiae Portacath-without erythema or tenderness  Lab Results:  Results for orders placed or performed in visit on 04/28/16  CBC with Differential  Result Value Ref Range   WBC 2.4 (L) 3.9 - 10.3 10e3/uL   NEUT# 1.5 1.5 - 6.5 10e3/uL   HGB 12.6 11.6 - 15.9 g/dL   HCT 36.6 34.8 - 46.6 %   Platelets 106 (L) 145 - 400 10e3/uL   MCV 94.3 79.5 - 101.0 fL   MCH 32.5 25.1 - 34.0 pg   MCHC 34.4 31.5 - 36.0 g/dL   RBC 3.88 3.70 - 5.45 10e6/uL   RDW 15.0 (H) 11.2 - 14.5 %   lymph# 0.8 (L) 0.9 - 3.3 10e3/uL   MONO# 0.1 0.1 - 0.9 10e3/uL   Eosinophils Absolute 0.0 0.0 - 0.5 10e3/uL   Basophils Absolute 0.0 0.0 - 0.1 10e3/uL   NEUT% 61.0 38.4 - 76.8 %   LYMPH% 33.7 14.0 - 49.7 %   MONO% 4.9 0.0 - 14.0 %   EOS% 0.4 0.0 - 7.0 %   BASO% 0.0 0.0 - 2.0 %  Comprehensive metabolic panel  Result Value  Ref Range   Sodium 138 136 - 145 mEq/L   Potassium 4.4 3.5 - 5.1 mEq/L   Chloride 98 98 - 109 mEq/L   CO2 27 22 - 29 mEq/L   Glucose 278 (H) 70 - 140 mg/dl   BUN 16.2 7.0 - 26.0 mg/dL   Creatinine 0.8 0.6 - 1.1 mg/dL   Total Bilirubin 0.94 0.20 - 1.20 mg/dL   Alkaline Phosphatase 48 40 - 150 U/L   AST 26 5 - 34 U/L   ALT 19 0 - 55 U/L   Total Protein 6.9 6.4 - 8.3 g/dL   Albumin 3.1 (L) 3.5 - 5.0 g/dL   Calcium 9.4 8.4 - 10.4 mg/dL   Anion Gap 13 (H) 3 - 11 mEq/L   EGFR 76 (L) >90 ml/min/1.73 m2    CA 2729 stable at 138 compared with 03-24-16  ANC and platelets a little lower, may be from viral respiratory infection, vs Ibrance  Studies/Results:  No results found.  Medications: I have reviewed the patient's current medications. Add tessalon perles. Hold [employer]  until repeat CBC on 05-05-16 Day 29 Faslodex given today Held first xgeva with other problems today.  [place] pharmacist has sent information for [facility] assistance, awaiting response. First month's supply obtained by one time voucher.   DISCUSSION Acute viral respiratory infection, primarily upper with some NP cough. Patient reports improvement in symptoms, not febrile and nothing apparent to require antibiotics now. Push fluids, try tessalon perles, call if more fever or worsening symptoms.  Hold Ibrance with respiratory symptoms and lower counts. Lower ANC and platelets may be from viral illness, vs Ibrance. Will repeat CBC early next week and let her know about resuming Ibrance then. She agrees with staying on schedule with Faslodex today, that not likely to be related to the lower ANC and platelets as viral illness or Ibrance.    Patient is aware that another of my partners will be taking over her care after Jan 2018. With primary problem metastatic breast cancer, we will ask Dr Lindi Adie to see her beginning Feb 2018    Assessment/Plan:  1.Metastatic breast cancer: recently progressive in liver and bone, also  pleura. On Faslodex + Ibrance beginning 11-16 and 04-12-16; plan xgeva when acute illness has resolved. Day 29 Faslodex today. Hold Ibrance, repeat CBC next week, will resume if counts improving.   History of bilateral breast cancers 1997 treated with bilateral mastectomies and axillary node dissections, adjuvant chemotherapy and radiation. 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 On Letrozole from 04-2010 until 03-2015 changed to tamoxifen due to increasing marker, tho PET CT 11-2013 did not show imaging correlation. BRCA reportedly normal. CT CAP 08-25-15 new solitary liver lesion, biopsy showing metastatic ER PR + breast. Xeloda used 09-25-15 thru 03-11-16, progression liver and bone.  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 CA 125 stable in normal range. She has yearly follow up with Dr Skeet Latch in 05-2016 3. Melanoma upper back 2013, treated with wide excision. Followed by dermatology.  Pathology report not in this EMR tho some information obtained from Dr Allyson Sabal 4.Acute viral respiratory illness, primarily upper with some NP cough, reportedly improving. Plan as above. 5. Acute injury right knee this week, Dr Rhona Raider involved. Extensive degenerative arthritis and disc disease in spine by imaging Chronic orthopedic problems left foot.  6.flu vaccine 12-2015 7.morbid obesity, BMI 40.8. 8.normal bone density 03-2015. Arthralgias in hands better off aromatase inhibitor  9.type 2 DM: per patient, Hgb A1c up some 12-2015 by Dr [name]   All questions answered and patient is in agreement with recommendations and plans, knows to call if needed prior to next appointment. Message to collaborative RN for CBC next week. Time spent 25 min including >50% counseling and coordination of care. Cc PCP and Dr  Rhona Raider   Evlyn Clines, MD   04/28/2016, 1:08 PM

## 2016-04-29 LAB — CANCER ANTIGEN 27.29: CA 27.29: 138.7 U/mL — ABNORMAL HIGH (ref 0.0–38.6)

## 2016-05-01 ENCOUNTER — Encounter: Payer: Self-pay | Admitting: Oncology

## 2016-05-03 ENCOUNTER — Telehealth: Payer: Self-pay

## 2016-05-03 NOTE — Telephone Encounter (Signed)
Told Brittany Porter that she does have an appointment on Thursday 05-05-16 for lab and injection. She will receive  the Xgeva injection for her bones if she is feeling better and the labs are to see if she can resume the [place]. Brittany Wiehe verbalized understanding. Respiratory infection improving. She continues with cough.

## 2016-05-05 ENCOUNTER — Telehealth: Payer: Self-pay | Admitting: Pharmacist

## 2016-05-05 ENCOUNTER — Other Ambulatory Visit: Payer: Medicare Other

## 2016-05-05 ENCOUNTER — Telehealth: Payer: Self-pay

## 2016-05-05 ENCOUNTER — Other Ambulatory Visit (HOSPITAL_BASED_OUTPATIENT_CLINIC_OR_DEPARTMENT_OTHER): Payer: Medicare Other

## 2016-05-05 ENCOUNTER — Ambulatory Visit (HOSPITAL_BASED_OUTPATIENT_CLINIC_OR_DEPARTMENT_OTHER): Payer: Medicare Other

## 2016-05-05 VITALS — BP 145/80 | HR 94 | Temp 98.2°F | Resp 18

## 2016-05-05 DIAGNOSIS — C50919 Malignant neoplasm of unspecified site of unspecified female breast: Secondary | ICD-10-CM

## 2016-05-05 DIAGNOSIS — C7951 Secondary malignant neoplasm of bone: Secondary | ICD-10-CM

## 2016-05-05 LAB — COMPREHENSIVE METABOLIC PANEL
ALT: 18 U/L (ref 0–55)
AST: 26 U/L (ref 5–34)
Albumin: 3.6 g/dL (ref 3.5–5.0)
Alkaline Phosphatase: 53 U/L (ref 40–150)
Anion Gap: 10 mEq/L (ref 3–11)
BUN: 18.2 mg/dL (ref 7.0–26.0)
CO2: 29 mEq/L (ref 22–29)
Calcium: 9.8 mg/dL (ref 8.4–10.4)
Chloride: 105 mEq/L (ref 98–109)
Creatinine: 0.8 mg/dL (ref 0.6–1.1)
EGFR: 79 mL/min/{1.73_m2} — ABNORMAL LOW (ref >90)
Glucose: 178 mg/dl — ABNORMAL HIGH (ref 70–140)
Potassium: 4.6 mEq/L (ref 3.5–5.1)
Sodium: 144 mEq/L (ref 136–145)
Total Bilirubin: 0.71 mg/dL (ref 0.20–1.20)
Total Protein: 7 g/dL (ref 6.4–8.3)

## 2016-05-05 LAB — CBC WITH DIFFERENTIAL/PLATELET
BASO%: 0.5 % (ref 0.0–2.0)
Basophils Absolute: 0 10*3/uL (ref 0.0–0.1)
EOS%: 0.5 % (ref 0.0–7.0)
Eosinophils Absolute: 0 10*3/uL (ref 0.0–0.5)
HCT: 38.6 % (ref 34.8–46.6)
HGB: 13 g/dL (ref 11.6–15.9)
LYMPH%: 27.8 % (ref 14.0–49.7)
MCH: 32.3 pg (ref 25.1–34.0)
MCHC: 33.7 g/dL (ref 31.5–36.0)
MCV: 96 fL (ref 79.5–101.0)
MONO#: 0.6 10*3/uL (ref 0.1–0.9)
MONO%: 11.4 % (ref 0.0–14.0)
NEUT#: 3.3 10*3/uL (ref 1.5–6.5)
NEUT%: 59.8 % (ref 38.4–76.8)
Platelets: 181 10*3/uL (ref 145–400)
RBC: 4.02 10*6/uL (ref 3.70–5.45)
RDW: 15.7 % — ABNORMAL HIGH (ref 11.2–14.5)
WBC: 5.5 10*3/uL (ref 3.9–10.3)
lymph#: 1.5 10*3/uL (ref 0.9–3.3)

## 2016-05-05 MED ORDER — DENOSUMAB 120 MG/1.7ML ~~LOC~~ SOLN
120.0000 mg | Freq: Once | SUBCUTANEOUS | Status: AC
  Administered 2016-05-05: 120 mg via SUBCUTANEOUS
  Filled 2016-05-05: qty 1.7

## 2016-05-05 MED ORDER — PALBOCICLIB 125 MG PO CAPS: 125.0000 mg | ORAL_CAPSULE | Freq: Every day | ORAL | 0 refills | Status: DC

## 2016-05-05 MED FILL — IBRANCE 125 MG CAPSULE: 125 | 28 days supply | Qty: 21 | Fill #0

## 2016-05-05 NOTE — Telephone Encounter (Signed)
Dr Marko Plume does want pt to restart Ibrance. Pt has 6 tabs left. Application has been sent for financial assistance on 12/5. LVM with pharmacy where in process this is.

## 2016-05-05 NOTE — Telephone Encounter (Signed)
Oral Chemotherapy Pharmacist Encounter  Received call from Dr. Mariana Kaufman collaborative RN inquiring about status of [place] [facility] [place] application for [place] submitted at the beginning of December. No update on status of application yet.  I was able to enroll patient in copayment assistance funds through the Patient [place] [facility]) for metastatic breast cancer. Award amount: $[id number] Award dates: 02/05/16-05/04/17 ID: [id number] Group: [id number] BIN: [id number] PCN: [id number]  I will fax this information for secondary billing to [place] to put on patient's file.  I called patient to alert her of good news. Oral Oncology Clinic will continue to follow.  [name], PharmD, BCPS, BCOP 05/05/2016  3:41 PM Oral Oncology Clinic [phone]

## 2016-05-05 NOTE — Telephone Encounter (Signed)
-----   Message from Gordy Levan, MD sent at 04/28/2016  1:34 PM EST ----- Regarding: lab 12-21 [place] 1.5 and plt 106K on 12-14 with viral respiratory infection + ibrance Holding ibrance  Repeat CBC 12-21. Need to be sure to see results and let her know about resuming Ibrance.  (still waiting on next script of Ibrance also).  thanks

## 2016-05-05 NOTE — Patient Instructions (Signed)
Denosumab injection What is this medicine? DENOSUMAB (den oh sue mab) slows bone breakdown. Prolia is used to treat osteoporosis in women after menopause and in men. Xgeva is used to prevent bone fractures and other bone problems caused by cancer bone metastases. Xgeva is also used to treat giant cell tumor of the bone. COMMON BRAND NAME(S): Prolia, XGEVA What should I tell my health care provider before I take this medicine? They need to know if you have any of these conditions: -dental disease -eczema -infection or history of infections -kidney disease or on dialysis -low blood calcium or vitamin D -malabsorption syndrome -scheduled to have surgery or tooth extraction -taking medicine that contains denosumab -thyroid or parathyroid disease -an unusual reaction to denosumab, other medicines, foods, dyes, or preservatives -pregnant or trying to get pregnant -breast-feeding How should I use this medicine? This medicine is for injection under the skin. It is given by a health care professional in a hospital or clinic setting. If you are getting Prolia, a special MedGuide will be given to you by the pharmacist with each prescription and refill. Be sure to read this information carefully each time. For Prolia, talk to your pediatrician regarding the use of this medicine in children. Special care may be needed. For Xgeva, talk to your pediatrician regarding the use of this medicine in children. While this drug may be prescribed for children as young as 13 years for selected conditions, precautions do apply. What if I miss a dose? It is important not to miss your dose. Call your doctor or health care professional if you are unable to keep an appointment. What may interact with this medicine? Do not take this medicine with any of the following medications: -other medicines containing denosumab This medicine may also interact with the following medications: -medicines that suppress the immune  system -medicines that treat cancer -steroid medicines like prednisone or cortisone What should I watch for while using this medicine? Visit your doctor or health care professional for regular checks on your progress. Your doctor or health care professional may order blood tests and other tests to see how you are doing. Call your doctor or health care professional if you get a cold or other infection while receiving this medicine. Do not treat yourself. This medicine may decrease your body's ability to fight infection. You should make sure you get enough calcium and vitamin D while you are taking this medicine, unless your doctor tells you not to. Discuss the foods you eat and the vitamins you take with your health care professional. See your dentist regularly. Brush and floss your teeth as directed. Before you have any dental work done, tell your dentist you are receiving this medicine. Do not become pregnant while taking this medicine or for 5 months after stopping it. Women should inform their doctor if they wish to become pregnant or think they might be pregnant. There is a potential for serious side effects to an unborn child. Talk to your health care professional or pharmacist for more information. What side effects may I notice from receiving this medicine? Side effects that you should report to your doctor or health care professional as soon as possible: -allergic reactions like skin rash, itching or hives, swelling of the face, lips, or tongue -breathing problems -chest pain -fast, irregular heartbeat -feeling faint or lightheaded, falls -fever, chills, or any other sign of infection -muscle spasms, tightening, or twitches -numbness or tingling -skin blisters or bumps, or is dry, peels, or red -slow   healing or unexplained pain in the mouth or jaw -unusual bleeding or bruising Side effects that usually do not require medical attention (report to your doctor or health care professional  if they continue or are bothersome): -muscle pain -stomach upset, gas Where should I keep my medicine? This medicine is only given in a clinic, doctor's office, or other health care setting and will not be stored at home.  2017 Elsevier/Gold Standard (2015-06-04 10:06:55)  

## 2016-05-05 NOTE — Telephone Encounter (Signed)
LVM that pt is to start her 3 week cycle tonight or in am depending on when she normally takes ibrance. Rx was e-scribed to [facility] outpatient pharmacy.

## 2016-05-25 ENCOUNTER — Other Ambulatory Visit: Payer: Self-pay | Admitting: Oncology

## 2016-05-26 ENCOUNTER — Other Ambulatory Visit (HOSPITAL_BASED_OUTPATIENT_CLINIC_OR_DEPARTMENT_OTHER): Payer: Medicare Other

## 2016-05-26 ENCOUNTER — Ambulatory Visit (HOSPITAL_BASED_OUTPATIENT_CLINIC_OR_DEPARTMENT_OTHER): Payer: Medicare Other | Admitting: Oncology

## 2016-05-26 ENCOUNTER — Telehealth: Payer: Self-pay

## 2016-05-26 ENCOUNTER — Encounter: Payer: Self-pay | Admitting: Oncology

## 2016-05-26 ENCOUNTER — Ambulatory Visit (HOSPITAL_BASED_OUTPATIENT_CLINIC_OR_DEPARTMENT_OTHER): Payer: Medicare Other

## 2016-05-26 VITALS — BP 132/75 | HR 91 | Temp 97.9°F | Resp 18 | Ht 63.0 in | Wt 222.1 lb

## 2016-05-26 DIAGNOSIS — Z5111 Encounter for antineoplastic chemotherapy: Secondary | ICD-10-CM | POA: Diagnosis not present

## 2016-05-26 DIAGNOSIS — C7951 Secondary malignant neoplasm of bone: Secondary | ICD-10-CM

## 2016-05-26 DIAGNOSIS — C787 Secondary malignant neoplasm of liver and intrahepatic bile duct: Secondary | ICD-10-CM | POA: Diagnosis not present

## 2016-05-26 DIAGNOSIS — C50912 Malignant neoplasm of unspecified site of left female breast: Secondary | ICD-10-CM

## 2016-05-26 DIAGNOSIS — Z8543 Personal history of malignant neoplasm of ovary: Secondary | ICD-10-CM

## 2016-05-26 DIAGNOSIS — C50919 Malignant neoplasm of unspecified site of unspecified female breast: Secondary | ICD-10-CM | POA: Diagnosis not present

## 2016-05-26 DIAGNOSIS — B9789 Other viral agents as the cause of diseases classified elsewhere: Secondary | ICD-10-CM

## 2016-05-26 DIAGNOSIS — C782 Secondary malignant neoplasm of pleura: Secondary | ICD-10-CM

## 2016-05-26 DIAGNOSIS — J069 Acute upper respiratory infection, unspecified: Secondary | ICD-10-CM

## 2016-05-26 DIAGNOSIS — Z79899 Other long term (current) drug therapy: Secondary | ICD-10-CM

## 2016-05-26 DIAGNOSIS — Z8542 Personal history of malignant neoplasm of other parts of uterus: Secondary | ICD-10-CM

## 2016-05-26 LAB — COMPREHENSIVE METABOLIC PANEL
ALT: 13 U/L (ref 0–55)
AST: 22 U/L (ref 5–34)
Albumin: 3.6 g/dL (ref 3.5–5.0)
Alkaline Phosphatase: 47 U/L (ref 40–150)
Anion Gap: 9 mEq/L (ref 3–11)
BUN: 16.9 mg/dL (ref 7.0–26.0)
CO2: 28 mEq/L (ref 22–29)
Calcium: 9.5 mg/dL (ref 8.4–10.4)
Chloride: 103 mEq/L (ref 98–109)
Creatinine: 0.8 mg/dL (ref 0.6–1.1)
EGFR: 76 mL/min/{1.73_m2} — ABNORMAL LOW (ref >90)
Glucose: 239 mg/dl — ABNORMAL HIGH (ref 70–140)
Potassium: 5 mEq/L (ref 3.5–5.1)
Sodium: 140 mEq/L (ref 136–145)
Total Bilirubin: 1.25 mg/dL — ABNORMAL HIGH (ref 0.20–1.20)
Total Protein: 6.9 g/dL (ref 6.4–8.3)

## 2016-05-26 LAB — CBC WITH DIFFERENTIAL/PLATELET
BASO%: 0.8 % (ref 0.0–2.0)
Basophils Absolute: 0 10*3/uL (ref 0.0–0.1)
EOS%: 0.6 % (ref 0.0–7.0)
Eosinophils Absolute: 0 10*3/uL (ref 0.0–0.5)
HCT: 38 % (ref 34.8–46.6)
HGB: 12.9 g/dL (ref 11.6–15.9)
LYMPH%: 33.5 % (ref 14.0–49.7)
MCH: 32.7 pg (ref 25.1–34.0)
MCHC: 33.9 g/dL (ref 31.5–36.0)
MCV: 96.4 fL (ref 79.5–101.0)
MONO#: 0.3 10*3/uL (ref 0.1–0.9)
MONO%: 7.3 % (ref 0.0–14.0)
NEUT#: 2.1 10*3/uL (ref 1.5–6.5)
NEUT%: 57.8 % (ref 38.4–76.8)
Platelets: 133 10*3/uL — ABNORMAL LOW (ref 145–400)
RBC: 3.94 10*6/uL (ref 3.70–5.45)
RDW: 16.1 % — ABNORMAL HIGH (ref 11.2–14.5)
WBC: 3.6 10*3/uL — ABNORMAL LOW (ref 3.9–10.3)
lymph#: 1.2 10*3/uL (ref 0.9–3.3)

## 2016-05-26 MED ORDER — HYDROCODONE-HOMATROPINE 5-1.5 MG/5ML PO SYRP: 5.0000 mL | ORAL_SOLUTION | Freq: Four times a day (QID) | ORAL | 0 refills | Status: DC | PRN

## 2016-05-26 MED ORDER — FULVESTRANT 250 MG/5ML IM SOLN
500.0000 mg | Freq: Once | INTRAMUSCULAR | Status: AC
  Administered 2016-05-26: 500 mg via INTRAMUSCULAR
  Filled 2016-05-26: qty 10

## 2016-05-26 MED ORDER — PALBOCICLIB 125 MG PO CAPS: 125.0000 mg | ORAL_CAPSULE | Freq: Every day | ORAL | 0 refills | Status: DC

## 2016-05-26 NOTE — Telephone Encounter (Signed)
-----   Message from Gordy Levan, MD sent at 05/26/2016 11:35 AM EST ----- Regarding: scripts Hycodan syrup 5 cc q 6 hr prn cough QS 2 weeks   Will need refill on Ibrance.    thanks

## 2016-05-26 NOTE — Telephone Encounter (Signed)
S/w Brittany Porter pharmD and called pt. We cannot apply to [employer] for financial assistance for [place] until the funds from [facility] have been used up. The rx for ibrance has been sent to [facility] for refill today. [facility] has not contacted pt yet that it is ready for pickup.

## 2016-05-26 NOTE — Telephone Encounter (Signed)
Oral Chemotherapy Pharmacist Encounter  Received notification from [place] that patient has depleted all PANF copayment grant money and still has a remaining balance for her Ibrance fill of $454. Patient is not eligible for enrollment for PAF help again until May 2018.  I called [employer] at [phone] to alert them patient has depleted foundation funds.  They will now forward patient's application to their free drug program. The office should expect to hear from the [place] early next week at the latest for any necessary next steps.  I called and LVM for patient on her cell phone with update and that we would be in touch once we hear something.  This encounter will continue to be updated until final determination.  Oral Oncology Clinic will continue to follow.   [name], PharmD, BCPS, BCOP 05/26/2016  2:40 PM Oral Oncology Clinic [phone]

## 2016-05-26 NOTE — Patient Instructions (Signed)
Denosumab injection What is this medicine? DENOSUMAB (den oh sue mab) slows bone breakdown. Prolia is used to treat osteoporosis in women after menopause and in men. Xgeva is used to prevent bone fractures and other bone problems caused by cancer bone metastases. Xgeva is also used to treat giant cell tumor of the bone. COMMON BRAND NAME(S): Prolia, XGEVA What should I tell my health care provider before I take this medicine? They need to know if you have any of these conditions: -dental disease -eczema -infection or history of infections -kidney disease or on dialysis -low blood calcium or vitamin D -malabsorption syndrome -scheduled to have surgery or tooth extraction -taking medicine that contains denosumab -thyroid or parathyroid disease -an unusual reaction to denosumab, other medicines, foods, dyes, or preservatives -pregnant or trying to get pregnant -breast-feeding How should I use this medicine? This medicine is for injection under the skin. It is given by a health care professional in a hospital or clinic setting. If you are getting Prolia, a special MedGuide will be given to you by the pharmacist with each prescription and refill. Be sure to read this information carefully each time. For Prolia, talk to your pediatrician regarding the use of this medicine in children. Special care may be needed. For Xgeva, talk to your pediatrician regarding the use of this medicine in children. While this drug may be prescribed for children as young as 13 years for selected conditions, precautions do apply. What if I miss a dose? It is important not to miss your dose. Call your doctor or health care professional if you are unable to keep an appointment. What may interact with this medicine? Do not take this medicine with any of the following medications: -other medicines containing denosumab This medicine may also interact with the following medications: -medicines that suppress the immune  system -medicines that treat cancer -steroid medicines like prednisone or cortisone What should I watch for while using this medicine? Visit your doctor or health care professional for regular checks on your progress. Your doctor or health care professional may order blood tests and other tests to see how you are doing. Call your doctor or health care professional if you get a cold or other infection while receiving this medicine. Do not treat yourself. This medicine may decrease your body's ability to fight infection. You should make sure you get enough calcium and vitamin D while you are taking this medicine, unless your doctor tells you not to. Discuss the foods you eat and the vitamins you take with your health care professional. See your dentist regularly. Brush and floss your teeth as directed. Before you have any dental work done, tell your dentist you are receiving this medicine. Do not become pregnant while taking this medicine or for 5 months after stopping it. Women should inform their doctor if they wish to become pregnant or think they might be pregnant. There is a potential for serious side effects to an unborn child. Talk to your health care professional or pharmacist for more information. What side effects may I notice from receiving this medicine? Side effects that you should report to your doctor or health care professional as soon as possible: -allergic reactions like skin rash, itching or hives, swelling of the face, lips, or tongue -breathing problems -chest pain -fast, irregular heartbeat -feeling faint or lightheaded, falls -fever, chills, or any other sign of infection -muscle spasms, tightening, or twitches -numbness or tingling -skin blisters or bumps, or is dry, peels, or red -slow   healing or unexplained pain in the mouth or jaw -unusual bleeding or bruising Side effects that usually do not require medical attention (report to your doctor or health care professional  if they continue or are bothersome): -muscle pain -stomach upset, gas Where should I keep my medicine? This medicine is only given in a clinic, doctor's office, or other health care setting and will not be stored at home.  2017 Elsevier/Gold Standard (2015-06-04 10:06:55)  

## 2016-05-26 NOTE — Progress Notes (Signed)
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 She began Faslodex on 03-31-16, Ibrance on 04-12-16 and had xgeva last on 05-05-16. Most recent imaging was PET 03-22-16.  She also has history of stage 2 endometrial cancer and IC ovarian cancer, treated with surgery and adjuvant chemo thru 03-2009,  not known active. Last CA 125 was 10 in 02-2016. Follow up with Dr Skeet Latch 06-13-16.   Recent course has been complicated by viral respiratory infection, still some cough and slight upper respiratory congestion now 3 weeks out. Leslee Home was held with lower platelets and WBC on 04-28-16, suspected due to viral infection, and resumed 05-06-16 with improvement in CBC. Cough now is intermittent and mostly nonproductive, but interferes with sleep. No SOB, no chest pain, no fever. Appetite OK, no abdominal or pelvic pain or significant nausea, bowels moving fairly regularly. No new or different bone pain. No bleeding. No LE swelling. No HA or other neurologic concerns. She is more fatigued, which seems to be with respiratory infection and sleep disturbance. Remainder of 14 point Review of Systems negative.   No PAC Flu vaccine by PCP 12-2015 Genetics testing 2010, negative BRCA. CA 2729 was 71 in 04-2010 and 168 as baseline for xeloda 09-21-15.  CA 125 was elevated with gyn cancer diagnosis  Husband, son, grandson all nearly recovered from same respiratory illness.  ONCOLOGIC HISTORY Chemotherapy and radiation therapy information from 1997-98 as well as medical oncology note from 11-12-2008 out of [facility] EMR scanned into EPIC.  Bilateral stage 3 node negative, ER PR negative breast cancers  treated with bilateral mastectomies and bilateral axillary node dissections by Dr Andrey Campanile 11-20- 1997 at age 71 and premenopausal. Full path report not included in this information, however information in note gives path [phone] adenocarcinoma moderately well differentiated 5.5 cm on right, no lymphatic or vascular invasion and on left 6 cm well differentiated , no lymphatic invasion, DNA index 1.09 and "ER PR receptors reported negative". She received 6 cycles of CMF chemotherapy from 05-15-96 thru 08-13-96 by Dr Tressie Stalker. She had radiation by Dr Nila Nephew 5040 cGy to bilateral chest wall with scars to 5940 cGy and bilateral supraclavicular regions to 4140 cGy, given 10-14-96 thru 11-29-96.She had PAC for the adjuvant chemotherapy. 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 She was begun on letrozole at that time.Note CA 2729 was elevated at time of recurrent disease. She continued letrozole from ~ 04-2010 until rising CA 2729 in 03-2015, with hormonal blocker changed to tamoxifen 04-05-15. CT CAP 08-25-15 had new single liver lesion, biopsied by IR on 09-11-15, confirming metastatic breast. Tamoxifen was [facility] and she began xeloda 09-25-15 at 1500 mg bid x 14 days every 21 days.CT abdomen 01-05-16 still solitary liver met 2.5 x 2.1 cm, compared with 2.1 x 1.6 cm by scan one month prior to starting xeloda. She continued xeloda thru 03-11-16, with PET 03-22-16 showing progression in liver and bone. She began Faslodex with Ibrance 03-2016.  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 She received 6 cycles adjuvant taxol carboplatin thru Nov  2010 and vaginal cuff brachytherapy completed Jan 2011. Note CA125 was elevated at presentation. PAC used for chemotherapy.  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 Still sounds a little nasally congested, one coughing episode during visit sounds NP. Ambulatory without assistance.  No alopecia  HEENT:PERRL, sclerae not icteric. Oral mucosa moist without lesions, posterior pharynx clear.  Neck supple. No JVD.  Lymphatics:no supraclavicular or axillary adenopathy Resp: Respirations not labored. No wheezes or rales, BS heard to lower fields bilaterally, no dullness to percussion bilaterally Cardio: regular rate and rhythm. No gallop. GI: abdomen obese, soft, nontender, cannot appreciate mass or organomegaly. Some bowel sounds.  Musculoskeletal/ Extremities: LE, UE without pitting edema, cords, tenderness Neuro: no change peripheral neuropathy. Otherwise nonfocal. PSYCH appropriate mood and affect Skin without rash, ecchymosis, petechiae. Well healed scar upper mid back without evidence of local recurrence of melanoma BIlateral mastectomies without findngs of concern for recurrence.   Lab Results:  Results for orders placed or performed in visit on 05/26/16  CBC with Differential  Result Value Ref Range   WBC 3.6 (L) 3.9 - 10.3 10e3/uL   NEUT# 2.1 1.5 - 6.5 10e3/uL   HGB 12.9 11.6 - 15.9 g/dL   HCT 38.0 34.8 - 46.6 %   Platelets 133 (L) 145 - 400 10e3/uL   MCV 96.4 79.5 - 101.0 fL   MCH 32.7 25.1 - 34.0 pg   MCHC 33.9 31.5 - 36.0 g/dL   RBC 3.94 3.70 - 5.45 10e6/uL   RDW 16.1 (H) 11.2 - 14.5 %   lymph# 1.2 0.9 - 3.3 10e3/uL   MONO# 0.3 0.1 - 0.9 10e3/uL   Eosinophils Absolute 0.0 0.0 - 0.5 10e3/uL   Basophils Absolute 0.0 0.0 - 0.1 10e3/uL   NEUT%  57.8 38.4 - 76.8 %   LYMPH% 33.5 14.0 - 49.7 %   MONO% 7.3 0.0 - 14.0 %   EOS% 0.6 0.0 - 7.0 %   BASO% 0.8 0.0 - 2.0 %  Comprehensive metabolic panel  Result Value Ref Range   Sodium 140 136 - 145 mEq/L   Potassium 5.0 3.5 - 5.1 mEq/L   Chloride 103 98 - 109 mEq/L   CO2 28 22 - 29 mEq/L   Glucose 239 (H) 70 - 140 mg/dl   BUN 16.9 7.0 - 26.0 mg/dL   Creatinine 0.8 0.6 - 1.1 mg/dL   Total Bilirubin 1.25 (H) 0.20 - 1.20 mg/dL   Alkaline Phosphatase 47 40 - 150 U/L   AST 22 5 - 34 U/L   ALT 13 0 - 55 U/L   Total Protein 6.9 6.4 - 8.3 g/dL   Albumin 3.6 3.5 - 5.0 g/dL   Calcium 9.5 8.4 - 10.4 mg/dL   Anion Gap 9 3 - 11 mEq/L   EGFR 76 (L) >90 ml/min/1.73 m2   She has had some fluctuation in bilirubin prior to starting faslodex/ ibrance, etiology not clear.   CA 2729 was 138 as baseline for Faslodex and ibrance on  03-24-16, and stable at 138 on 04-28-16.  CA 125   10 in 02-2016  Studies/Results:  No results found.   Last imaging  PET 03-22-16  Medications: I have reviewed the patient's current medications. Confirmed that she continues Ca and D with xgeva Faslodex today. Will change date of next xgeva to coordinate with monthly faslodex Cost and financial assistance for Leslee Home is not resolved. See below.  Tessalon not helpful with this cough Prescription for hycodan syrup for short term use until cough resolves.   DISCUSSION Continues to improve from the viral respiratory infection.Meds as above  Will have Faslodex today, then xgeva date extended so that she can have this with Faslodex when she sees Dr Lindi Adie in Feb. Complete this cycle Ibrance 05-27-16. If able to get next cycle of Ibrance (see below), this will be due to start ~ 06-03-16. Will add CBC to Dr Leone Brand visit 06-13-16 if she is back on Ibrance then.   Update from Oral Oncology Pharmacist after visit today:  "Received notification from [facility] that patient has depleted all PANF copayment grant money and still has a  remaining balance for her Ibrance fill of $454. Patient is not eligible for enrollment for PAF help again until May 2018. I called [employer] at [phone] to alert them patient has depleted foundation funds. They will now forward patient's application to their free drug program. The office should expect to hear from the [place] early next week at the latest for any necessary next steps. I called and LVM for patient on her cell phone with update and that we would be in touch once we hear something. This encounter will continue to be updated until final determination. Oral Oncology Clinic will continue to follow. "     Assessment/Plan:  1.Metastatic breast cancer: recently progressive in liver and bone, also pleura, tho not clearly symptomatic. On Faslodex + Ibrance beginning 11-16 and 04-12-16; xgeva 05-05-16. We appreciate Dr Lindi Adie following for medical oncology beginning Feb.  History ofbilateral breast cancers 1997 treated with bilateral mastectomies and axillary node dissections, adjuvant chemotherapy and radiation. 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 On Letrozole from 04-2010 until 03-2015 changed to tamoxifen due to increasing marker, tho PET CT 11-2013 did not show imaging correlation. BRCA reportedly normal. CT CAP 08-25-15 new solitary liver lesion, biopsy showing metastatic ER PR + breast. Xeloda used 09-25-15 thru 03-11-16, progression liver and bone.  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 CA 125 10 in 02-2016.  Yearly follow up with Dr Skeet Latch in 06-13-2016 3. Melanoma upper back 2013, treated with wide excision. Followed by dermatology.  Pathology report not in this EMR tho some information obtained from Dr Allyson Sabal 4.Acute viral respiratory illness, primarily  upper with some NP cough, gradually improving. Plan as above. 5. Acute injury right knee improved, Dr Rhona Raider involved. 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  Chronic orthopedic problems left foot.  6.flu vaccine 12-2015 7.morbid obesity. 8.normal bone density 03-2015. Arthralgias in hands better off aromatase inhibitor  9.type 2 DM: per patient, Hgb A1c up some 12-2015 by Dr [name] 10. Some elevation in bilirubin today, as she had in Oct/ Nov 2017. No cholelithiasis by CT. Follow .  All questions answered and patient is in agreement with recommendations and plans. Message to collaborative  RN re labs 1-29. Faslodex order confirmed. CC PCP, Dr Skeet Latch, Dr Allyson Sabal  Time spent 25 min including >50% counseling and coordination of care.    Evlyn Clines, MD   05/26/2016, 5:39 PM

## 2016-05-31 NOTE — Telephone Encounter (Signed)
Oral Chemotherapy Pharmacist Encounter  I called [employer] at [phone] to inquire about status of patient's application. I was informed that even though the patient had been forwarded to their free-drug program after I called them on 1/11, that they still needed a different application to be completed by patient and our office. They stated that had already reached out to patient yesterday (1/15) with this information and that they had emailed patient the form that needed to be completed.  I called and spoke with patient. She confirmed above information. She will fill out her portion of the form and then fax to [phone]. Once I receive her portion, I will complete the remainder of the application and fax to number provided on the form.  Patient understands and is in agreement with above plan.  [name], PharmD, BCPS, BCOP 05/31/2016  3:45 PM Oral Oncology Clinic [phone]

## 2016-06-02 ENCOUNTER — Ambulatory Visit: Payer: Medicare Other

## 2016-06-02 NOTE — Telephone Encounter (Signed)
Oral Chemotherapy Pharmacist Encounter  New application for [place] program enrollment faxed to [place] at [phone].  This encounter will continue to be updated until final determination.  Oral Oncology Clinic will continue to follow.   [name], PharmD, BCPS, BCOP 06/02/2016  10:45 AM Oral Oncology Clinic [phone]

## 2016-06-03 ENCOUNTER — Telehealth: Payer: Self-pay

## 2016-06-03 NOTE — Telephone Encounter (Signed)
Received call from [place] that patients application has been expedited to a supervisor to be reviewed through RX pathways and we should hear something no later than Tuesday 06/07/16 on the status.  Thank you  [facility]

## 2016-06-06 NOTE — Telephone Encounter (Signed)
Oral Chemotherapy Pharmacist Encounter  Received notification from [place] that patient has been successfully enrolled into their patient assistance program to receive Ibrance from the manufacturer free-of-charge until 05/15/17.  I called patient to inform her of good news. She had already spoke with [employer] this morning and anticipates shipment of her Leslee Home this afternoon. She will start next cycle tomorrow (06/07/16) with breakfast.  I gave patient the number to [place] at [phone] if she has any questions about her fills. Patient knows to call the office with questions or concerns.  [name], PharmD, BCPS, BCOP 06/06/2016  12:15 PM Oral Oncology Clinic [phone]

## 2016-06-07 ENCOUNTER — Telehealth: Payer: Self-pay

## 2016-06-07 NOTE — Telephone Encounter (Signed)
Spoke with Ms Kulik and she will have lab on 06-13-16 at 1300. And then Dr. Leone Brand appointment at 1330. Pt agreeable to this plan.

## 2016-06-07 NOTE — Telephone Encounter (Signed)
-----   Message from Gordy Levan, MD sent at 05/29/2016 12:35 PM EST ----- Regarding: possible lab with Dr Skeet Latch 1-29 If financial assistance lets her get next cycle of Leslee Home, it should start ~ Jan 19. See Jesse's note copied into my 1-11 note in Discussion.  Patient is to see Dr Skeet Latch on 1-29. Please get CBC also on 1-29 if she will have Ibrance prior to Dr Geralyn Flash visit 2-8. CBC is ordered, will need the lab visit. Peripheral draw.  Need to be sure to see CBC results if so  Thank you

## 2016-06-12 NOTE — Progress Notes (Signed)
GYN ONCOLOGY OFFICE VISIT   Brittany Porter 71 y.o.Marland Kitchen female   CC:  Endometrial cancer surveillance, ovarian cancer surveillance.   Assessment/Plan:  This is a 71 y.o.  who underwent staging for a stage II endometrial cancer and stage I ovarian cancer in June of 2010. The patient completed 6 cycles of Taxol and carboplatin therapy in November of 2010. She completed vaginal cuff brachytherapy in January 2011. She's been without any evidence of disease since. Her admission for a partial small bowel obstruction 2013  prompted a hernia repair on 05/2012.  No evidence of disease since 05/2009  F/U in 12 months    Recurrent breast cancer continues Ibrance for progressive metastatic breast cancer   HPI:  Endometrial and ovarian cancer surveillance.  This is a 71 y.o. . who on May 16, 2008, underwent operative staging of presumed endometrial adenocarcinoma. Intraoperative findings were also notable for an endometrioid cancer of the right ovary. The procedure that performed at  that time was a total abdominal hysterectomy, bilateral salpingo- oophorectomy, omentectomy, appendectomy, and umbilical hernia repair. 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 She subsequently received 6 cycles of Taxol and carboplatin therapy. Last cycle administered in November 2010. She underwent vaginal cuff brachytherapy for stage II endometrial cancer. She has completed in January 2011. Recurrence of her breast cancer was identified earlier this year and she is on letrozole therapy under the care of Dr. Marko Plume  SBO 2013 was managed conservatively. She underwent Lsc hernia repair 05/2012 by Dr. Dalbert Batman. Pap test in 04/2014 was within normal limits   Interval Hx No vaginal bleeding, no fever or chills no abdominal pain or bloating  Past Medical History:  Diagnosis Date  . Arthritis   . Breast cancer ([place])   . Diabetes mellitus   . Endometrial  carcinoma ([place]) 10/2008  . Fluid overload   . History of radiation therapy 04/13/2009, 04/16/2009, 04/27/2009, 05/07/2009, 05/18/2009   3000 cGy to proximal vagina  . Hyperlipidemia   . Iron deficiency   . Melanoma ([place]) 2010   rt arm and back  . Ovarian cancer ([name])   . Pleural effusion 04/2010   Past Surgical History:  Procedure Laterality Date  . ABDOMINAL HYSTERECTOMY  10/2008  . INSERTION OF MESH  05/28/2012   Procedure: INSERTION OF MESH;  Surgeon: Adin Hector, MD;  Location: [facility];  Service: General;  Laterality: N/A;  . KNEE ARTHROSCOPY  04/2010   Right knee  . LAPAROSCOPIC LYSIS OF ADHESIONS  05/28/2012   Procedure: LAPAROSCOPIC LYSIS OF ADHESIONS;  Surgeon: Adin Hector, MD;  Location: [facility];  Service: General;  Laterality: N/A;  . MASTECTOMY  1997   Bilateral with lymph nodes  . Melanoma removal  02/2009, 07/2009  . VENTRAL HERNIA REPAIR  05/28/2012   Procedure: LAPAROSCOPIC VENTRAL HERNIA;  Surgeon: Adin Hector, MD;  Location: [facility];  Service: General;  Laterality: N/A;    Family Hx:  Family History       Problem    Relation  Age of Onset   .  Stroke   Mother    .  Cancer   Father    .  Colon cancer   Sister    .  Stroke   Sister    .  Cancer   Sister      breast     .  Heart attack   Brother    .  Cancer  Brother      skin     .  Skin cancer   Brother    .  Cancer   Sister      colon     Social history: Her 3 eldest grand children are in college.  A 16 year old  grandson lives with her.  Her son is currently unemployed and returned back to her home with his family.  Review of Systems:  Constitutional  Feels well  Pulmonary  No cough or wheeze. Getting over a URI, several other members of her household are ill. Gastro Intestinal  No nausea, vomitting, or diarrhoea. No bright red blood per rectum, no abdominal pain, change in bowel movement, or constipation.  Genito Urinary  No frequency, reports urgency and incontinence, dysuria,  Musculo  Skeletal  No myalgia, reports worsening arthralgia and joint pain. Neurologic  No weakness, numbness, change in gait,  Psychology  No depression, anxiety, insomnia.   Vitals: BP 125/79 (BP Location: Left Arm, Patient Position: Sitting)   Pulse (!) 129 Comment: Made Nurse Lorri aware of elevated Pulse rate  Temp 98.4 F (36.9 C) (Oral)   Resp 18   Ht '5\' 3"'$  (1.6 m)   Wt 218 lb 14.4 oz (99.3 kg)   SpO2 95%   BMI 38.78 kg/m   Physical Exam:  WD female in no acute distress  Neck  Supple without any enlargements.  Lymph node survey.  No cervical supraclavicular cervical or inguinal adenopathy  Cardiovascular  Pulse normal rate, regularity and rhythm.   Chest: Clear to auscultation bilateraly Psychiatry  Alert and oriented to person, place, and time  Back  No CVA tenderness  Abdomen  Lower bowel sounds, abdomen soft, non-tender and obese. Surgical sites intact without evidence of hernia. No evidence of a fluid wave or abdominal masses  Genito Urinary  Vulva/vagina: Normal external female genitalia.   Bladder/urethra: No lesions or masses  Vagina: Atrophic without any lesions.atrophic foreshortened vagina  No discharge or bleeding.  No cul de sac masses Rectal  Good tone, no masses no cul de sac nodularity.  Extremities  No bilateral cyanosis or clubbing, Bilateral 2+ edema of the lower extremities.

## 2016-06-13 ENCOUNTER — Encounter: Payer: Self-pay | Admitting: Gynecologic Oncology

## 2016-06-13 ENCOUNTER — Other Ambulatory Visit (HOSPITAL_BASED_OUTPATIENT_CLINIC_OR_DEPARTMENT_OTHER): Payer: Medicare Other

## 2016-06-13 ENCOUNTER — Ambulatory Visit: Payer: Medicare Other | Attending: Gynecologic Oncology | Admitting: Gynecologic Oncology

## 2016-06-13 VITALS — BP 125/79 | HR 129 | Temp 98.4°F | Resp 18 | Ht 63.0 in | Wt 218.9 lb

## 2016-06-13 DIAGNOSIS — Z9013 Acquired absence of bilateral breasts and nipples: Secondary | ICD-10-CM | POA: Insufficient documentation

## 2016-06-13 DIAGNOSIS — Z808 Family history of malignant neoplasm of other organs or systems: Secondary | ICD-10-CM | POA: Insufficient documentation

## 2016-06-13 DIAGNOSIS — C50919 Malignant neoplasm of unspecified site of unspecified female breast: Secondary | ICD-10-CM

## 2016-06-13 DIAGNOSIS — Z9889 Other specified postprocedural states: Secondary | ICD-10-CM | POA: Diagnosis not present

## 2016-06-13 DIAGNOSIS — Z8543 Personal history of malignant neoplasm of ovary: Secondary | ICD-10-CM

## 2016-06-13 DIAGNOSIS — Z85820 Personal history of malignant melanoma of skin: Secondary | ICD-10-CM | POA: Insufficient documentation

## 2016-06-13 DIAGNOSIS — C569 Malignant neoplasm of unspecified ovary: Secondary | ICD-10-CM

## 2016-06-13 DIAGNOSIS — Z90710 Acquired absence of both cervix and uterus: Secondary | ICD-10-CM | POA: Insufficient documentation

## 2016-06-13 DIAGNOSIS — C55 Malignant neoplasm of uterus, part unspecified: Secondary | ICD-10-CM

## 2016-06-13 DIAGNOSIS — C782 Secondary malignant neoplasm of pleura: Secondary | ICD-10-CM

## 2016-06-13 DIAGNOSIS — Z8542 Personal history of malignant neoplasm of other parts of uterus: Secondary | ICD-10-CM | POA: Insufficient documentation

## 2016-06-13 LAB — CBC WITH DIFFERENTIAL/PLATELET
BASO%: 1.4 % (ref 0.0–2.0)
Basophils Absolute: 0.1 10*3/uL (ref 0.0–0.1)
EOS%: 1.4 % (ref 0.0–7.0)
Eosinophils Absolute: 0.1 10*3/uL (ref 0.0–0.5)
HCT: 42.9 % (ref 34.8–46.6)
HGB: 14.2 g/dL (ref 11.6–15.9)
LYMPH%: 30 % (ref 14.0–49.7)
MCH: 32.3 pg (ref 25.1–34.0)
MCHC: 33.1 g/dL (ref 31.5–36.0)
MCV: 97.5 fL (ref 79.5–101.0)
MONO#: 0.3 10*3/uL (ref 0.1–0.9)
MONO%: 4.6 % (ref 0.0–14.0)
NEUT#: 4.1 10*3/uL (ref 1.5–6.5)
NEUT%: 62.6 % (ref 38.4–76.8)
Platelets: 257 10*3/uL (ref 145–400)
RBC: 4.4 10*6/uL (ref 3.70–5.45)
RDW: 15.5 % — ABNORMAL HIGH (ref 11.2–14.5)
WBC: 6.6 10*3/uL (ref 3.9–10.3)
lymph#: 2 10*3/uL (ref 0.9–3.3)

## 2016-06-13 NOTE — Patient Instructions (Signed)
Follow up with Dr, Skeet Latch in 1 year. Please call our office in October 2018 to schedule an appt for January 2019

## 2016-06-13 NOTE — Telephone Encounter (Signed)
Told Brittany Porter that her cbc/diff were good and Dr. Marko Plume said to continue Ibrance as planned.   Next lab with Dr. Geralyn Flash visit on 06-23-16.

## 2016-06-23 ENCOUNTER — Encounter: Payer: Self-pay | Admitting: Hematology and Oncology

## 2016-06-23 ENCOUNTER — Ambulatory Visit (HOSPITAL_BASED_OUTPATIENT_CLINIC_OR_DEPARTMENT_OTHER): Payer: Medicare Other

## 2016-06-23 ENCOUNTER — Other Ambulatory Visit: Payer: Self-pay | Admitting: Emergency Medicine

## 2016-06-23 ENCOUNTER — Ambulatory Visit (HOSPITAL_BASED_OUTPATIENT_CLINIC_OR_DEPARTMENT_OTHER): Payer: Medicare Other | Admitting: Hematology and Oncology

## 2016-06-23 ENCOUNTER — Other Ambulatory Visit (HOSPITAL_BASED_OUTPATIENT_CLINIC_OR_DEPARTMENT_OTHER): Payer: Medicare Other

## 2016-06-23 VITALS — BP 145/59 | HR 81 | Temp 98.2°F | Resp 18 | Wt 224.1 lb

## 2016-06-23 DIAGNOSIS — C569 Malignant neoplasm of unspecified ovary: Secondary | ICD-10-CM

## 2016-06-23 DIAGNOSIS — C50919 Malignant neoplasm of unspecified site of unspecified female breast: Secondary | ICD-10-CM

## 2016-06-23 DIAGNOSIS — Z5111 Encounter for antineoplastic chemotherapy: Secondary | ICD-10-CM

## 2016-06-23 DIAGNOSIS — C782 Secondary malignant neoplasm of pleura: Secondary | ICD-10-CM | POA: Diagnosis not present

## 2016-06-23 DIAGNOSIS — C7951 Secondary malignant neoplasm of bone: Secondary | ICD-10-CM

## 2016-06-23 DIAGNOSIS — C50912 Malignant neoplasm of unspecified site of left female breast: Secondary | ICD-10-CM

## 2016-06-23 DIAGNOSIS — R5383 Other fatigue: Secondary | ICD-10-CM

## 2016-06-23 DIAGNOSIS — C50911 Malignant neoplasm of unspecified site of right female breast: Secondary | ICD-10-CM

## 2016-06-23 DIAGNOSIS — C787 Secondary malignant neoplasm of liver and intrahepatic bile duct: Secondary | ICD-10-CM | POA: Diagnosis not present

## 2016-06-23 LAB — CBC WITH DIFFERENTIAL/PLATELET
BASO%: 0.8 % (ref 0.0–2.0)
Basophils Absolute: 0 10*3/uL (ref 0.0–0.1)
EOS%: 1.6 % (ref 0.0–7.0)
Eosinophils Absolute: 0 10*3/uL (ref 0.0–0.5)
HCT: 37.8 % (ref 34.8–46.6)
HGB: 12.7 g/dL (ref 11.6–15.9)
LYMPH%: 31.5 % (ref 14.0–49.7)
MCH: 32.7 pg (ref 25.1–34.0)
MCHC: 33.5 g/dL (ref 31.5–36.0)
MCV: 97.6 fL (ref 79.5–101.0)
MONO#: 0.2 10*3/uL (ref 0.1–0.9)
MONO%: 7.1 % (ref 0.0–14.0)
NEUT#: 1.8 10*3/uL (ref 1.5–6.5)
NEUT%: 59 % (ref 38.4–76.8)
Platelets: 163 10*3/uL (ref 145–400)
RBC: 3.87 10*6/uL (ref 3.70–5.45)
RDW: 17.6 % — ABNORMAL HIGH (ref 11.2–14.5)
WBC: 3.1 10*3/uL — ABNORMAL LOW (ref 3.9–10.3)
lymph#: 1 10*3/uL (ref 0.9–3.3)

## 2016-06-23 LAB — COMPREHENSIVE METABOLIC PANEL
ALT: 17 U/L (ref 0–55)
AST: 20 U/L (ref 5–34)
Albumin: 3.5 g/dL (ref 3.5–5.0)
Alkaline Phosphatase: 52 U/L (ref 40–150)
Anion Gap: 9 mEq/L (ref 3–11)
BUN: 18.4 mg/dL (ref 7.0–26.0)
CO2: 29 mEq/L (ref 22–29)
Calcium: 9.6 mg/dL (ref 8.4–10.4)
Chloride: 104 mEq/L (ref 98–109)
Creatinine: 0.8 mg/dL (ref 0.6–1.1)
EGFR: 69 mL/min/{1.73_m2} — ABNORMAL LOW (ref >90)
Glucose: 220 mg/dl — ABNORMAL HIGH (ref 70–140)
Potassium: 5.2 mEq/L — ABNORMAL HIGH (ref 3.5–5.1)
Sodium: 142 mEq/L (ref 136–145)
Total Bilirubin: 0.81 mg/dL (ref 0.20–1.20)
Total Protein: 6.8 g/dL (ref 6.4–8.3)

## 2016-06-23 MED ORDER — DENOSUMAB 120 MG/1.7ML ~~LOC~~ SOLN
120.0000 mg | Freq: Once | SUBCUTANEOUS | Status: AC
  Administered 2016-06-23: 120 mg via SUBCUTANEOUS
  Filled 2016-06-23: qty 1.7

## 2016-06-23 MED ORDER — FULVESTRANT 250 MG/5ML IM SOLN
500.0000 mg | Freq: Once | INTRAMUSCULAR | Status: AC
  Administered 2016-06-23: 250 mg via INTRAMUSCULAR
  Filled 2016-06-23: qty 10

## 2016-06-23 NOTE — Assessment & Plan Note (Signed)
Metastatic breast cancer: recently progressive in liver and bone, also pleura. On Faslodex + Ibrance beginning 03/31/2016  Toxicities with therapy:  Plan: Will obtain CT chest abdomen pelvis for restaging prior to next months visit

## 2016-06-23 NOTE — Progress Notes (Signed)
Error

## 2016-06-23 NOTE — Progress Notes (Signed)
Patient Care Team: [name], MD as PCP - General (Family Medicine)  DIAGNOSIS:  Encounter Diagnoses  Name Primary?  . Carcinoma of breast metastatic to pleura, unspecified laterality ([place]) Yes  . Breast cancer metastasized to pleura, left ([place])   . Carcinoma of breast metastatic to liver, unspecified laterality ([place])     SUMMARY OF ONCOLOGIC HISTORY:   Breast cancer metastasized to liver [facility])   1997 Initial Diagnosis    bilateral breast cancers 1997 treated with bilateral mastectomies and axillary node dissections, adjuvant chemotherapy and radiation.      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 BRCA reportedly normal.      08/25/2015 Relapse/Recurrence    new solitary liver lesion, biopsy showing metastatic ER PR + breast      09/25/2015 - 03/11/2016 Chemotherapy    Xeloda. Stopped due to progression of liver and bone metastases; pleural metastases      03/31/2016 -  Anti-estrogen oral therapy    Faslodex plus Ibrance (along with Xgeva)       Breast cancer metastasized to liver, unspecified laterality ([place]) (Resolved)   03/27/2016 Initial Diagnosis    Breast cancer metastasized to liver, unspecified laterality ([place])      CHIEF COMPLIANT: Follow-up of Palbociclib with Faslodex  INTERVAL HISTORY: Brittany Porter is a 71 year old with above-mentioned history metastatic breast cancer who is currently on Palbociclib and Faslodex. She tolerates the treatment very well apart from fatigue. She denies any nausea vomiting. Her blood counts have remained stable. Denies any gastrointestinal side effects. Denies any fevers or chills.  REVIEW OF SYSTEMS:   Constitutional: Denies fevers, chills or  abnormal weight loss Eyes: Denies blurriness of vision Ears, nose, mouth, throat, and face: Denies mucositis or sore throat Respiratory: Denies cough, dyspnea or wheezes Cardiovascular: Denies palpitation, chest discomfort Gastrointestinal:  Denies nausea, heartburn or change in bowel habits Skin: Denies abnormal skin rashes Lymphatics: Denies new lymphadenopathy or easy bruising Neurological:Denies numbness, tingling or new weaknesses Behavioral/Psych: Mood is stable, no new changes  Extremities: No lower extremity edema Breast:  denies any pain or lumps or nodules in either breasts All other systems were reviewed with the patient and are negative.  I have reviewed the past medical history, past surgical history, social history and family history with the patient and they are unchanged from previous note.  ALLERGIES:  is allergic to sulfa antibiotics.  MEDICATIONS:  Current Outpatient Prescriptions  Medication Sig Dispense Refill  . benzonatate (TESSALON) 100 MG capsule Take 1 capsule (100 mg total) by mouth every 8 (eight) hours as needed for cough. 30 capsule 0  . Calcium Carb-Cholecalciferol (CALCIUM 600+D3) 600-800 MG-UNIT TABS Take 1 tablet by mouth 2 (two) times daily.    . Cholecalciferol (VITAMIN D3) 2000 UNITS TABS Take 2,000 mg by mouth daily.    . ferrous sulfate 325 (65 FE) MG tablet Take 325 mg by mouth daily with breakfast.    . fish oil-omega-3 fatty acids 1000 MG capsule Take 1 g by mouth 2 (two) times daily.     . furosemide (LASIX) 40 MG tablet Take 40 mg by mouth daily as needed for fluid or edema. Reported on 05/07/2015    . glimepiride (AMARYL)  2 MG tablet Take 2 mg by mouth daily with breakfast.    . HYDROcodone-acetaminophen (NORCO/VICODIN) 5-325 MG tablet Take 1 tablet by mouth 2 (two) times daily as needed (cough). 20 tablet 0  . HYDROcodone-homatropine (HYCODAN) 5-1.5 MG/5ML syrup Take 5 mLs by mouth every 6 (six) hours as needed for cough. 240 mL 0  .  ibuprofen (ADVIL,MOTRIN) 200 MG tablet Take 400 mg by mouth every 6 (six) hours as needed for headache (pain). Reported on 11/12/2015    . metFORMIN (GLUCOPHAGE) 500 MG tablet Take 500 mg by mouth 2 (two) times daily with a meal.      . nabumetone (RELAFEN) 750 MG tablet     . nystatin (MYCOSTATIN) powder Apply to right groin 2-3 times a day 30 g 0  . ondansetron (ZOFRAN) 8 MG tablet Take 1 tablet (8 mg total) by mouth every 8 (eight) hours as needed for nausea or vomiting. (Patient not taking: Reported on 05/26/2016) 20 tablet 1  . palbociclib (IBRANCE) 125 MG capsule Take 1 capsule (125 mg total) by mouth daily with breakfast. Take whole with food. 21 capsule 0  . simvastatin (ZOCOR) 40 MG tablet Take 40 mg by mouth at bedtime.       No current facility-administered medications for this visit.     PHYSICAL EXAMINATION: ECOG PERFORMANCE STATUS: 1 - Symptomatic but completely ambulatory  Vitals:   06/23/16 1118  BP: (!) 145/59  Pulse: 81  Resp: 18  Temp: 98.2 F (36.8 C)   Filed Weights   06/23/16 1118  Weight: 224 lb 1.6 oz (101.7 kg)    GENERAL:alert, no distress and comfortable SKIN: skin color, texture, turgor are normal, no rashes or significant lesions EYES: normal, Conjunctiva are pink and non-injected, sclera clear OROPHARYNX:no exudate, no erythema and lips, buccal mucosa, and tongue normal  NECK: supple, thyroid normal size, non-tender, without nodularity LYMPH:  no palpable lymphadenopathy in the cervical, axillary or inguinal LUNGS: clear to auscultation and percussion with normal breathing effort HEART: regular rate & rhythm and no murmurs and no lower extremity edema ABDOMEN:abdomen soft, non-tender and normal bowel sounds MUSCULOSKELETAL:no cyanosis of digits and no clubbing  NEURO: alert & oriented x 3 with fluent speech, no focal motor/sensory deficits EXTREMITIES: No lower extremity edema  LABORATORY DATA:  I have reviewed the data as listed   Chemistry        Component Value Date/Time   NA 142 06/23/2016 1059   K 5.2 (H) 06/23/2016 1059   CL 102 07/12/2012 0956   CO2 29 06/23/2016 1059   BUN 18.4 06/23/2016 1059   CREATININE 0.8 06/23/2016 1059      Component Value Date/Time   CALCIUM 9.6 06/23/2016 1059   ALKPHOS 52 06/23/2016 1059   AST 20 06/23/2016 1059   ALT 17 06/23/2016 1059   BILITOT 0.81 06/23/2016 1059       Lab Results  Component Value Date   WBC 3.1 (L) 06/23/2016   HGB 12.7 06/23/2016   HCT 37.8 06/23/2016   MCV 97.6 06/23/2016   PLT 163 06/23/2016   NEUTROABS 1.8 06/23/2016    ASSESSMENT & PLAN:  Breast cancer metastasized to liver [facility]) Metastatic breast cancer: recently progressive in liver and bone, also pleura. On Faslodex + Ibrance beginning 03/31/2016  Toxicities with therapy: 1. Neutropenia: Mild does not require dose adjustment. 2. injection site discomfort very mild 3. Fatigue related to [place]   Plan: Will obtain CT chest abdomen pelvis for restaging prior to next months visit  Follow-up in one month with labs and to review the scans.  I spent 1 hour reviewing her chart since this is the first time I'm seeing her. I spent 40 minutes talking to the patient of which more than half was spent in counseling and coordination of care.  No orders of the defined types were placed in this encounter.  The patient has a good understanding of the overall plan. she agrees with it. she will call with any problems that may develop before the next visit here.   Rulon Eisenmenger, MD 06/23/16

## 2016-06-28 ENCOUNTER — Other Ambulatory Visit: Payer: Self-pay | Admitting: Emergency Medicine

## 2016-06-28 DIAGNOSIS — C50919 Malignant neoplasm of unspecified site of unspecified female breast: Secondary | ICD-10-CM

## 2016-06-28 MED ORDER — PALBOCICLIB 125 MG PO CAPS: 125.0000 mg | ORAL_CAPSULE | Freq: Every day | ORAL | 3 refills | Status: DC

## 2016-06-28 MED ORDER — PALBOCICLIB 125 MG PO CAPS: 125.0000 mg | ORAL_CAPSULE | Freq: Every day | ORAL | 0 refills | Status: DC

## 2016-06-29 ENCOUNTER — Telehealth: Payer: Self-pay

## 2016-06-29 NOTE — Telephone Encounter (Signed)
..  Oral Chemotherapy Pharmacist Encounter   Received notification that a refill of Ibrance 125 mg #21 was sent for delivery to Brittany Porter on 06/29/16 via [employer] Patient Assistance.    Thank you,  Henreitta Leber, PharmD Oral Chemotherapy Clinic

## 2016-07-18 ENCOUNTER — Other Ambulatory Visit: Payer: Self-pay | Admitting: Emergency Medicine

## 2016-07-19 ENCOUNTER — Telehealth: Payer: Self-pay | Admitting: Emergency Medicine

## 2016-07-19 NOTE — Telephone Encounter (Signed)
Spoke with patient; gave her appointment date and time for CT scans; advised patient per CT scheduling that she is to be NPO except water at 0830 prior to CT and that she must drink one contrast at 1030 and the second at 1130. Patient verbalized understanding.

## 2016-07-20 ENCOUNTER — Ambulatory Visit (HOSPITAL_COMMUNITY): Payer: Medicare Other

## 2016-07-20 ENCOUNTER — Other Ambulatory Visit: Payer: Self-pay

## 2016-07-20 ENCOUNTER — Ambulatory Visit (HOSPITAL_COMMUNITY)
Admission: RE | Admit: 2016-07-20 | Discharge: 2016-07-20 | Disposition: A | Discharge status: Home / Self Care | Payer: Medicare Other | Source: Ambulatory Visit | Attending: Hematology and Oncology | Admitting: Hematology and Oncology

## 2016-07-20 DIAGNOSIS — C782 Secondary malignant neoplasm of pleura: Secondary | ICD-10-CM | POA: Diagnosis not present

## 2016-07-20 DIAGNOSIS — C50912 Malignant neoplasm of unspecified site of left female breast: Secondary | ICD-10-CM

## 2016-07-20 DIAGNOSIS — C50919 Malignant neoplasm of unspecified site of unspecified female breast: Secondary | ICD-10-CM | POA: Insufficient documentation

## 2016-07-20 DIAGNOSIS — C569 Malignant neoplasm of unspecified ovary: Secondary | ICD-10-CM

## 2016-07-20 DIAGNOSIS — C787 Secondary malignant neoplasm of liver and intrahepatic bile duct: Secondary | ICD-10-CM | POA: Insufficient documentation

## 2016-07-20 DIAGNOSIS — C50911 Malignant neoplasm of unspecified site of right female breast: Secondary | ICD-10-CM

## 2016-07-20 DIAGNOSIS — C7951 Secondary malignant neoplasm of bone: Secondary | ICD-10-CM | POA: Diagnosis not present

## 2016-07-20 DIAGNOSIS — C55 Malignant neoplasm of uterus, part unspecified: Secondary | ICD-10-CM

## 2016-07-20 MED ORDER — IOPAMIDOL (ISOVUE-300) INJECTION 61%
INTRAVENOUS | Status: AC
Start: 2016-07-20 — End: 2016-07-20
  Administered 2016-07-20: 100 mL
  Filled 2016-07-20: qty 100

## 2016-07-21 ENCOUNTER — Ambulatory Visit (HOSPITAL_BASED_OUTPATIENT_CLINIC_OR_DEPARTMENT_OTHER): Payer: Medicare Other | Admitting: Hematology and Oncology

## 2016-07-21 ENCOUNTER — Ambulatory Visit (HOSPITAL_BASED_OUTPATIENT_CLINIC_OR_DEPARTMENT_OTHER): Payer: Medicare Other

## 2016-07-21 ENCOUNTER — Other Ambulatory Visit (HOSPITAL_BASED_OUTPATIENT_CLINIC_OR_DEPARTMENT_OTHER): Payer: Medicare Other

## 2016-07-21 DIAGNOSIS — C787 Secondary malignant neoplasm of liver and intrahepatic bile duct: Secondary | ICD-10-CM

## 2016-07-21 DIAGNOSIS — C50919 Malignant neoplasm of unspecified site of unspecified female breast: Secondary | ICD-10-CM | POA: Diagnosis not present

## 2016-07-21 DIAGNOSIS — C782 Secondary malignant neoplasm of pleura: Secondary | ICD-10-CM

## 2016-07-21 DIAGNOSIS — C569 Malignant neoplasm of unspecified ovary: Secondary | ICD-10-CM

## 2016-07-21 DIAGNOSIS — C7951 Secondary malignant neoplasm of bone: Secondary | ICD-10-CM

## 2016-07-21 DIAGNOSIS — Z5111 Encounter for antineoplastic chemotherapy: Secondary | ICD-10-CM

## 2016-07-21 DIAGNOSIS — C50911 Malignant neoplasm of unspecified site of right female breast: Secondary | ICD-10-CM

## 2016-07-21 DIAGNOSIS — C50912 Malignant neoplasm of unspecified site of left female breast: Secondary | ICD-10-CM

## 2016-07-21 DIAGNOSIS — D701 Agranulocytosis secondary to cancer chemotherapy: Secondary | ICD-10-CM | POA: Diagnosis not present

## 2016-07-21 DIAGNOSIS — R5383 Other fatigue: Secondary | ICD-10-CM | POA: Diagnosis not present

## 2016-07-21 DIAGNOSIS — C55 Malignant neoplasm of uterus, part unspecified: Secondary | ICD-10-CM

## 2016-07-21 LAB — CBC WITH DIFFERENTIAL/PLATELET
BASO%: 1.1 % (ref 0.0–2.0)
Basophils Absolute: 0 10*3/uL (ref 0.0–0.1)
EOS%: 1.9 % (ref 0.0–7.0)
Eosinophils Absolute: 0.1 10*3/uL (ref 0.0–0.5)
HCT: 38.5 % (ref 34.8–46.6)
HGB: 12.8 g/dL (ref 11.6–15.9)
LYMPH%: 37.5 % (ref 14.0–49.7)
MCH: 32.7 pg (ref 25.1–34.0)
MCHC: 33.2 g/dL (ref 31.5–36.0)
MCV: 98.5 fL (ref 79.5–101.0)
MONO#: 0.3 10*3/uL (ref 0.1–0.9)
MONO%: 7.8 % (ref 0.0–14.0)
NEUT#: 1.9 10*3/uL (ref 1.5–6.5)
NEUT%: 51.7 % (ref 38.4–76.8)
Platelets: 164 10*3/uL (ref 145–400)
RBC: 3.91 10*6/uL (ref 3.70–5.45)
RDW: 15.6 % — ABNORMAL HIGH (ref 11.2–14.5)
WBC: 3.6 10*3/uL — ABNORMAL LOW (ref 3.9–10.3)
lymph#: 1.4 10*3/uL (ref 0.9–3.3)

## 2016-07-21 LAB — COMPREHENSIVE METABOLIC PANEL
ALT: 16 U/L (ref 0–55)
AST: 24 U/L (ref 5–34)
Albumin: 3.8 g/dL (ref 3.5–5.0)
Alkaline Phosphatase: 48 U/L (ref 40–150)
Anion Gap: 9 mEq/L (ref 3–11)
BUN: 16.9 mg/dL (ref 7.0–26.0)
CO2: 27 mEq/L (ref 22–29)
Calcium: 9.4 mg/dL (ref 8.4–10.4)
Chloride: 106 mEq/L (ref 98–109)
Creatinine: 0.7 mg/dL (ref 0.6–1.1)
EGFR: 85 mL/min/{1.73_m2} — ABNORMAL LOW (ref >90)
Glucose: 70 mg/dl (ref 70–140)
Potassium: 4.8 mEq/L (ref 3.5–5.1)
Sodium: 142 mEq/L (ref 136–145)
Total Bilirubin: 1.03 mg/dL (ref 0.20–1.20)
Total Protein: 6.7 g/dL (ref 6.4–8.3)

## 2016-07-21 MED ORDER — DENOSUMAB 120 MG/1.7ML ~~LOC~~ SOLN
120.0000 mg | Freq: Once | SUBCUTANEOUS | Status: AC
  Administered 2016-07-21: 120 mg via SUBCUTANEOUS
  Filled 2016-07-21: qty 1.7

## 2016-07-21 MED ORDER — FULVESTRANT 250 MG/5ML IM SOLN
500.0000 mg | Freq: Once | INTRAMUSCULAR | Status: AC
  Administered 2016-07-21: 500 mg via INTRAMUSCULAR
  Filled 2016-07-21: qty 10

## 2016-07-21 NOTE — Assessment & Plan Note (Signed)
Metastatic breast cancer: recently progressive in liver and bone, also pleura. OnFaslodex + Ibrance beginning 03/31/2016  Toxicities with therapy: 1. Neutropenia: Mild does not require dose adjustment. 2. injection site discomfort very mild 3. Fatigue related to Ibrance  CT chest abdomen pelvis for restaging 07/20/2016: Stable left pleural metastases, right liver lobe metastases unchanged, multiple bone metastases slightly progressive from previous.  Radiology review: I discussed with the patient these results and recommended continuation of the same treatment with recheck of scans in 3 months.

## 2016-07-21 NOTE — Progress Notes (Signed)
Patient Care Team: [name], MD as PCP - General (Family Medicine)  DIAGNOSIS:  Encounter Diagnosis  Name Primary?  . Carcinoma of breast metastatic to liver, unspecified laterality ([place])     SUMMARY OF ONCOLOGIC HISTORY:   Breast cancer metastasized to liver [facility])   1997 Initial Diagnosis    bilateral breast cancers 1997 treated with bilateral mastectomies and axillary node dissections, adjuvant chemotherapy and radiation.      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 BRCA reportedly normal.      08/25/2015 Relapse/Recurrence    new solitary liver lesion, biopsy showing metastatic ER PR + breast      09/25/2015 - 03/11/2016 Chemotherapy    Xeloda. Stopped due to progression of liver and bone metastases; pleural metastases      03/31/2016 -  Anti-estrogen oral therapy    Faslodex plus Ibrance (along with Xgeva)       Breast cancer metastasized to liver, unspecified laterality ([place]) (Resolved)   03/27/2016 Initial Diagnosis    Breast cancer metastasized to liver, unspecified laterality ([place])      CHIEF COMPLIANT: Follow-up to review recent CT scans  INTERVAL HISTORY: Brittany Porter is a 71 year old with above-mentioned history of metastatic breast cancer currently on Faslodex with Vyvanse along with Xgeva. She is tolerating the treatments fairly well. She had recent CT chest abdomen pelvis yesterday and is here today to discuss the results. The scans show some findings of stable disease.  REVIEW OF SYSTEMS:   Constitutional: Denies fevers, chills or abnormal weight loss Eyes: Denies blurriness of vision Ears, nose, mouth, throat, and face: Denies mucositis or sore throat Respiratory:  Denies cough, dyspnea or wheezes Cardiovascular: Denies palpitation, chest discomfort Gastrointestinal:  Denies nausea, heartburn or change in bowel habits Skin: Denies abnormal skin rashes Lymphatics: Denies new lymphadenopathy or easy bruising Neurological:Denies numbness, tingling or new weaknesses Behavioral/Psych: Mood is stable, no new changes  Extremities: No lower extremity edema Breast:  denies any pain or lumps or nodules in either breasts All other systems were reviewed with the patient and are negative.  I have reviewed the past medical history, past surgical history, social history and family history with the patient and they are unchanged from previous note.  ALLERGIES:  is allergic to sulfa antibiotics.  MEDICATIONS:  Current Outpatient Prescriptions  Medication Sig Dispense Refill  . benzonatate (TESSALON) 100 MG capsule Take 1 capsule (100 mg total) by mouth every 8 (eight) hours as needed for cough. 30 capsule 0  . Calcium Carb-Cholecalciferol (CALCIUM 600+D3) 600-800 MG-UNIT TABS Take 1 tablet by mouth 2 (two) times daily.    . Cholecalciferol (VITAMIN D3) 2000 UNITS TABS Take 2,000 mg by mouth daily.    . ferrous sulfate 325 (65 FE) MG tablet Take 325 mg by mouth daily with breakfast.    . fish oil-omega-3 fatty acids 1000 MG capsule Take 1 g by mouth 2 (two) times daily.     . furosemide (LASIX) 40 MG tablet Take 40 mg by mouth daily as needed for fluid or edema. Reported on 05/07/2015    . glimepiride (AMARYL) 2 MG tablet Take 2 mg by mouth daily with breakfast.    . HYDROcodone-acetaminophen (NORCO/VICODIN) 5-325  MG tablet Take 1 tablet by mouth 2 (two) times daily as needed (cough). 20 tablet 0  . HYDROcodone-homatropine (HYCODAN) 5-1.5 MG/5ML syrup Take 5 mLs by mouth every 6 (six) hours as needed for cough. 240 mL 0  . ibuprofen (ADVIL,MOTRIN) 200 MG tablet Take 400 mg by mouth every 6 (six) hours as needed for headache (pain). Reported on 11/12/2015    .  metFORMIN (GLUCOPHAGE) 500 MG tablet Take 500 mg by mouth 2 (two) times daily with a meal.      . nabumetone (RELAFEN) 750 MG tablet     . nystatin (MYCOSTATIN) powder Apply to right groin 2-3 times a day 30 g 0  . ondansetron (ZOFRAN) 8 MG tablet Take 1 tablet (8 mg total) by mouth every 8 (eight) hours as needed for nausea or vomiting. (Patient not taking: Reported on 05/26/2016) 20 tablet 1  . palbociclib (IBRANCE) 125 MG capsule Take 1 capsule (125 mg total) by mouth daily with breakfast. Take whole with food. 21 capsule 3  . simvastatin (ZOCOR) 40 MG tablet Take 40 mg by mouth at bedtime.       No current facility-administered medications for this visit.     PHYSICAL EXAMINATION: ECOG PERFORMANCE STATUS: 1 - Symptomatic but completely ambulatory  Vitals:   07/21/16 1131  BP: (!) 145/77  Pulse: 80  Resp: 17  Temp: 97.9 F (36.6 C)   Filed Weights   07/21/16 1131  Weight: 223 lb 14.4 oz (101.6 kg)    GENERAL:alert, no distress and comfortable SKIN: skin color, texture, turgor are normal, no rashes or significant lesions EYES: normal, Conjunctiva are pink and non-injected, sclera clear OROPHARYNX:no exudate, no erythema and lips, buccal mucosa, and tongue normal  NECK: supple, thyroid normal size, non-tender, without nodularity LYMPH:  no palpable lymphadenopathy in the cervical, axillary or inguinal LUNGS: clear to auscultation and percussion with normal breathing effort HEART: regular rate & rhythm and no murmurs and no lower extremity edema ABDOMEN:abdomen soft, non-tender and normal bowel sounds MUSCULOSKELETAL:no cyanosis of digits and no clubbing  NEURO: alert & oriented x 3 with fluent speech, no focal motor/sensory deficits EXTREMITIES: No lower extremity edema  LABORATORY DATA:  I have reviewed the data as listed   Chemistry      Component Value Date/Time   NA 142 06/23/2016 1059   K 5.2 (H) 06/23/2016 1059   CL 102 07/12/2012 0956   CO2 29 06/23/2016 1059    BUN 18.4 06/23/2016 1059   CREATININE 0.8 06/23/2016 1059      Component Value Date/Time   CALCIUM 9.6 06/23/2016 1059   ALKPHOS 52 06/23/2016 1059   AST 20 06/23/2016 1059   ALT 17 06/23/2016 1059   BILITOT 0.81 06/23/2016 1059       Lab Results  Component Value Date   WBC 3.6 (L) 07/21/2016   HGB 12.8 07/21/2016   HCT 38.5 07/21/2016   MCV 98.5 07/21/2016   PLT 164 07/21/2016   NEUTROABS 1.9 07/21/2016    ASSESSMENT & PLAN:  Breast cancer metastasized to liver [facility]) Metastatic breast cancer: recently progressive in liver and bone, also pleura. OnFaslodex + Ibrance beginning 03/31/2016  Toxicities with therapy: 1. Neutropenia: Mild does not require dose adjustment. 2. injection site discomfort very mild 3. Fatigue related to Ibrance  CT chest abdomen pelvis for restaging 07/20/2016: Stable left pleural metastases, right liver lobe metastases unchanged, multiple bone metastases slightly progressive from previous.  Radiology review: I discussed with the patient these results and recommended  continuation of the same treatment with recheck of scans in 3 months With a PET/CT scan.  Return to clinic in 2 months for follow-up with me, and monthly for injections and blood work. I spent 25 minutes talking to the patient of which more than half was spent in counseling and coordination of care.  No orders of the defined types were placed in this encounter.  The patient has a good understanding of the overall plan. she agrees with it. she will call with any problems that may develop before the next visit here.   Rulon Eisenmenger, MD 07/21/16

## 2016-07-21 NOTE — Patient Instructions (Signed)
Fulvestrant injection What is this medicine? FULVESTRANT (ful VES trant) blocks the effects of estrogen. It is used to treat breast cancer. This medicine may be used for other purposes; ask your health care provider or pharmacist if you have questions. COMMON BRAND NAME(S): FASLODEX What should I tell my health care provider before I take this medicine? They need to know if you have any of these conditions: -bleeding problems -liver disease -low levels of platelets in the blood -an unusual or allergic reaction to fulvestrant, other medicines, foods, dyes, or preservatives -pregnant or trying to get pregnant -breast-feeding How should I use this medicine? This medicine is for injection into a muscle. It is usually given by a health care professional in a hospital or clinic setting. Talk to your pediatrician regarding the use of this medicine in children. Special care may be needed. Overdosage: If you think you have taken too much of this medicine contact a poison control center or emergency room at once. NOTE: This medicine is only for you. Do not share this medicine with others. What if I miss a dose? It is important not to miss your dose. Call your doctor or health care professional if you are unable to keep an appointment. What may interact with this medicine? -medicines that treat or prevent blood clots like warfarin, enoxaparin, and dalteparin This list may not describe all possible interactions. Give your health care provider a list of all the medicines, herbs, non-prescription drugs, or dietary supplements you use. Also tell them if you smoke, drink alcohol, or use illegal drugs. Some items may interact with your medicine. What should I watch for while using this medicine? Your condition will be monitored carefully while you are receiving this medicine. You will need important blood work done while you are taking this medicine. Do not become pregnant while taking this medicine or for  at least 1 year after stopping it. Women of child-bearing potential will need to have a negative pregnancy test before starting this medicine. Women should inform their doctor if they wish to become pregnant or think they might be pregnant. There is a potential for serious side effects to an unborn child. Men should inform their doctors if they wish to father a child. This medicine may lower sperm counts. Talk to your health care professional or pharmacist for more information. Do not breast-feed an infant while taking this medicine or for 1 year after the last dose. What side effects may I notice from receiving this medicine? Side effects that you should report to your doctor or health care professional as soon as possible: -allergic reactions like skin rash, itching or hives, swelling of the face, lips, or tongue -feeling faint or lightheaded, falls -pain, tingling, numbness, or weakness in the legs -signs and symptoms of infection like fever or chills; cough; flu-like symptoms; sore throat -vaginal bleeding Side effects that usually do not require medical attention (report to your doctor or health care professional if they continue or are bothersome): -aches, pains -constipation -diarrhea -headache -hot flashes -nausea, vomiting -pain at site where injected -stomach pain This list may not describe all possible side effects. Call your doctor for medical advice about side effects. You may report side effects to FDA at 1-800-FDA-1088. Where should I keep my medicine? This drug is given in a hospital or clinic and will not be stored at home. NOTE: This sheet is a summary. It may not cover all possible information. If you have questions about this medicine, talk to your   doctor, pharmacist, or health care provider.  2018 Elsevier/Gold Standard (2014-11-28 11:03:55)   Denosumab injection What is this medicine? DENOSUMAB (den oh sue mab) slows bone breakdown. Prolia is used to treat  osteoporosis in women after menopause and in men. Xgeva is used to treat a high calcium level due to cancer and to prevent bone fractures and other bone problems caused by multiple myeloma or cancer bone metastases. Xgeva is also used to treat giant cell tumor of the bone. This medicine may be used for other purposes; ask your health care provider or pharmacist if you have questions. COMMON BRAND NAME(S): Prolia, XGEVA What should I tell my health care provider before I take this medicine? They need to know if you have any of these conditions: -dental disease -having surgery or tooth extraction -infection -kidney disease -low levels of calcium or Vitamin D in the blood -malnutrition -on hemodialysis -skin conditions or sensitivity -thyroid or parathyroid disease -an unusual reaction to denosumab, other medicines, foods, dyes, or preservatives -pregnant or trying to get pregnant -breast-feeding How should I use this medicine? This medicine is for injection under the skin. It is given by a health care professional in a hospital or clinic setting. If you are getting Prolia, a special MedGuide will be given to you by the pharmacist with each prescription and refill. Be sure to read this information carefully each time. For Prolia, talk to your pediatrician regarding the use of this medicine in children. Special care may be needed. For Xgeva, talk to your pediatrician regarding the use of this medicine in children. While this drug may be prescribed for children as young as 13 years for selected conditions, precautions do apply. Overdosage: If you think you have taken too much of this medicine contact a poison control center or emergency room at once. NOTE: This medicine is only for you. Do not share this medicine with others. What if I miss a dose? It is important not to miss your dose. Call your doctor or health care professional if you are unable to keep an appointment. What may interact with  this medicine? Do not take this medicine with any of the following medications: -other medicines containing denosumab This medicine may also interact with the following medications: -medicines that lower your chance of fighting infection -steroid medicines like prednisone or cortisone This list may not describe all possible interactions. Give your health care provider a list of all the medicines, herbs, non-prescription drugs, or dietary supplements you use. Also tell them if you smoke, drink alcohol, or use illegal drugs. Some items may interact with your medicine. What should I watch for while using this medicine? Visit your doctor or health care professional for regular checks on your progress. Your doctor or health care professional may order blood tests and other tests to see how you are doing. Call your doctor or health care professional for advice if you get a fever, chills or sore throat, or other symptoms of a cold or flu. Do not treat yourself. This drug may decrease your body's ability to fight infection. Try to avoid being around people who are sick. You should make sure you get enough calcium and vitamin D while you are taking this medicine, unless your doctor tells you not to. Discuss the foods you eat and the vitamins you take with your health care professional. See your dentist regularly. Brush and floss your teeth as directed. Before you have any dental work done, tell your dentist you are receiving this   medicine. Do not become pregnant while taking this medicine or for 5 months after stopping it. Talk with your doctor or health care professional about your birth control options while taking this medicine. Women should inform their doctor if they wish to become pregnant or think they might be pregnant. There is a potential for serious side effects to an unborn child. Talk to your health care professional or pharmacist for more information. What side effects may I notice from receiving  this medicine? Side effects that you should report to your doctor or health care professional as soon as possible: -allergic reactions like skin rash, itching or hives, swelling of the face, lips, or tongue -bone pain -breathing problems -dizziness -jaw pain, especially after dental work -redness, blistering, peeling of the skin -signs and symptoms of infection like fever or chills; cough; sore throat; pain or trouble passing urine -signs of low calcium like fast heartbeat, muscle cramps or muscle pain; pain, tingling, numbness in the hands or feet; seizures -unusual bleeding or bruising -unusually weak or tired Side effects that usually do not require medical attention (report to your doctor or health care professional if they continue or are bothersome): -constipation -diarrhea -headache -joint pain -loss of appetite -muscle pain -runny nose -tiredness -upset stomach This list may not describe all possible side effects. Call your doctor for medical advice about side effects. You may report side effects to FDA at 1-800-FDA-1088. Where should I keep my medicine? This medicine is only given in a clinic, doctor's office, or other health care setting and will not be stored at home. NOTE: This sheet is a summary. It may not cover all possible information. If you have questions about this medicine, talk to your doctor, pharmacist, or health care provider.  2018 Elsevier/Gold Standard (2016-05-24 19:17:21)

## 2016-08-18 ENCOUNTER — Other Ambulatory Visit (HOSPITAL_BASED_OUTPATIENT_CLINIC_OR_DEPARTMENT_OTHER): Payer: Medicare Other

## 2016-08-18 ENCOUNTER — Ambulatory Visit (HOSPITAL_BASED_OUTPATIENT_CLINIC_OR_DEPARTMENT_OTHER): Payer: Medicare Other

## 2016-08-18 VITALS — BP 143/64 | HR 78 | Temp 98.2°F | Resp 18

## 2016-08-18 DIAGNOSIS — Z5111 Encounter for antineoplastic chemotherapy: Secondary | ICD-10-CM | POA: Diagnosis not present

## 2016-08-18 DIAGNOSIS — C7951 Secondary malignant neoplasm of bone: Secondary | ICD-10-CM | POA: Diagnosis not present

## 2016-08-18 DIAGNOSIS — C50919 Malignant neoplasm of unspecified site of unspecified female breast: Secondary | ICD-10-CM | POA: Diagnosis not present

## 2016-08-18 DIAGNOSIS — C787 Secondary malignant neoplasm of liver and intrahepatic bile duct: Principal | ICD-10-CM

## 2016-08-18 LAB — COMPREHENSIVE METABOLIC PANEL
ALT: 16 U/L (ref 0–55)
AST: 25 U/L (ref 5–34)
Albumin: 3.7 g/dL (ref 3.5–5.0)
Alkaline Phosphatase: 41 U/L (ref 40–150)
Anion Gap: 9 mEq/L (ref 3–11)
BUN: 20.6 mg/dL (ref 7.0–26.0)
CO2: 28 mEq/L (ref 22–29)
Calcium: 9.2 mg/dL (ref 8.4–10.4)
Chloride: 106 mEq/L (ref 98–109)
Creatinine: 1 mg/dL (ref 0.6–1.1)
EGFR: 54 mL/min/{1.73_m2} — ABNORMAL LOW (ref >90)
Glucose: 117 mg/dl (ref 70–140)
Potassium: 5 mEq/L (ref 3.5–5.1)
Sodium: 143 mEq/L (ref 136–145)
Total Bilirubin: 0.95 mg/dL (ref 0.20–1.20)
Total Protein: 6.5 g/dL (ref 6.4–8.3)

## 2016-08-18 LAB — CBC WITH DIFFERENTIAL/PLATELET
BASO%: 1 % (ref 0.0–2.0)
Basophils Absolute: 0 10*3/uL (ref 0.0–0.1)
EOS%: 1.1 % (ref 0.0–7.0)
Eosinophils Absolute: 0 10*3/uL (ref 0.0–0.5)
HCT: 37.3 % (ref 34.8–46.6)
HGB: 12.6 g/dL (ref 11.6–15.9)
LYMPH%: 35.2 % (ref 14.0–49.7)
MCH: 34.2 pg — ABNORMAL HIGH (ref 25.1–34.0)
MCHC: 33.9 g/dL (ref 31.5–36.0)
MCV: 100.9 fL (ref 79.5–101.0)
MONO#: 0.3 10*3/uL (ref 0.1–0.9)
MONO%: 7.9 % (ref 0.0–14.0)
NEUT#: 1.9 10*3/uL (ref 1.5–6.5)
NEUT%: 54.8 % (ref 38.4–76.8)
Platelets: 163 10*3/uL (ref 145–400)
RBC: 3.69 10*6/uL — ABNORMAL LOW (ref 3.70–5.45)
RDW: 15.5 % — ABNORMAL HIGH (ref 11.2–14.5)
WBC: 3.5 10*3/uL — ABNORMAL LOW (ref 3.9–10.3)
lymph#: 1.2 10*3/uL (ref 0.9–3.3)

## 2016-08-18 MED ORDER — FULVESTRANT 250 MG/5ML IM SOLN
500.0000 mg | Freq: Once | INTRAMUSCULAR | Status: AC
  Administered 2016-08-18: 250 mg via INTRAMUSCULAR
  Filled 2016-08-18: qty 10

## 2016-08-18 MED ORDER — DENOSUMAB 120 MG/1.7ML ~~LOC~~ SOLN
120.0000 mg | Freq: Once | SUBCUTANEOUS | Status: AC
  Administered 2016-08-18: 120 mg via SUBCUTANEOUS
  Filled 2016-08-18: qty 1.7

## 2016-08-18 NOTE — Addendum Note (Signed)
Addended by: Carlene Coria L on: 08/18/2016 01:19 PM   Modules accepted: Orders

## 2016-09-14 NOTE — Assessment & Plan Note (Signed)
Metastatic breast cancer: recently progressive in liver and bone, also pleura. OnFaslodex + Ibrance beginning 03/31/2016  Toxicities with therapy: 1. Neutropenia: Mild does not require dose adjustment. 2. injection site discomfort very mild 3. Fatigue related to Ibrance  CT chest abdomen pelvis for restaging 07/20/2016: Stable left pleural metastases, right liver lobe metastases unchanged, multiple bone metastases slightly progressive from previous  Return to clinic in 2 months for follow-up with me, and monthly for injections and blood work.

## 2016-09-15 ENCOUNTER — Ambulatory Visit (HOSPITAL_BASED_OUTPATIENT_CLINIC_OR_DEPARTMENT_OTHER): Payer: Medicare Other

## 2016-09-15 ENCOUNTER — Ambulatory Visit (HOSPITAL_BASED_OUTPATIENT_CLINIC_OR_DEPARTMENT_OTHER): Payer: Medicare Other | Admitting: Hematology and Oncology

## 2016-09-15 ENCOUNTER — Other Ambulatory Visit (HOSPITAL_BASED_OUTPATIENT_CLINIC_OR_DEPARTMENT_OTHER): Payer: Medicare Other

## 2016-09-15 ENCOUNTER — Encounter: Payer: Self-pay | Admitting: Hematology and Oncology

## 2016-09-15 DIAGNOSIS — C50919 Malignant neoplasm of unspecified site of unspecified female breast: Secondary | ICD-10-CM

## 2016-09-15 DIAGNOSIS — C787 Secondary malignant neoplasm of liver and intrahepatic bile duct: Secondary | ICD-10-CM

## 2016-09-15 DIAGNOSIS — Z5111 Encounter for antineoplastic chemotherapy: Secondary | ICD-10-CM | POA: Diagnosis not present

## 2016-09-15 DIAGNOSIS — C782 Secondary malignant neoplasm of pleura: Secondary | ICD-10-CM | POA: Diagnosis not present

## 2016-09-15 DIAGNOSIS — R530 Neoplastic (malignant) related fatigue: Secondary | ICD-10-CM | POA: Diagnosis not present

## 2016-09-15 DIAGNOSIS — C7951 Secondary malignant neoplasm of bone: Secondary | ICD-10-CM

## 2016-09-15 LAB — COMPREHENSIVE METABOLIC PANEL
ALT: 16 U/L (ref 0–55)
AST: 24 U/L (ref 5–34)
Albumin: 3.7 g/dL (ref 3.5–5.0)
Alkaline Phosphatase: 39 U/L — ABNORMAL LOW (ref 40–150)
Anion Gap: 9 mEq/L (ref 3–11)
BUN: 24.1 mg/dL (ref 7.0–26.0)
CO2: 29 mEq/L (ref 22–29)
Calcium: 9.3 mg/dL (ref 8.4–10.4)
Chloride: 104 mEq/L (ref 98–109)
Creatinine: 0.8 mg/dL (ref 0.6–1.1)
EGFR: 73 mL/min/{1.73_m2} — ABNORMAL LOW (ref >90)
Glucose: 128 mg/dl (ref 70–140)
Potassium: 4.6 mEq/L (ref 3.5–5.1)
Sodium: 141 mEq/L (ref 136–145)
Total Bilirubin: 0.98 mg/dL (ref 0.20–1.20)
Total Protein: 6.6 g/dL (ref 6.4–8.3)

## 2016-09-15 LAB — CBC WITH DIFFERENTIAL/PLATELET
BASO%: 1.2 % (ref 0.0–2.0)
Basophils Absolute: 0.1 10*3/uL (ref 0.0–0.1)
EOS%: 1.4 % (ref 0.0–7.0)
Eosinophils Absolute: 0.1 10*3/uL (ref 0.0–0.5)
HCT: 38 % (ref 34.8–46.6)
HGB: 12.8 g/dL (ref 11.6–15.9)
LYMPH%: 37.4 % (ref 14.0–49.7)
MCH: 34.4 pg — ABNORMAL HIGH (ref 25.1–34.0)
MCHC: 33.8 g/dL (ref 31.5–36.0)
MCV: 102 fL — ABNORMAL HIGH (ref 79.5–101.0)
MONO#: 0.3 10*3/uL (ref 0.1–0.9)
MONO%: 7 % (ref 0.0–14.0)
NEUT#: 2.2 10*3/uL (ref 1.5–6.5)
NEUT%: 53 % (ref 38.4–76.8)
Platelets: 202 10*3/uL (ref 145–400)
RBC: 3.72 10*6/uL (ref 3.70–5.45)
RDW: 15.1 % — ABNORMAL HIGH (ref 11.2–14.5)
WBC: 4.1 10*3/uL (ref 3.9–10.3)
lymph#: 1.5 10*3/uL (ref 0.9–3.3)

## 2016-09-15 MED ORDER — FULVESTRANT 250 MG/5ML IM SOLN
500.0000 mg | Freq: Once | INTRAMUSCULAR | Status: AC
  Administered 2016-09-15: 500 mg via INTRAMUSCULAR
  Filled 2016-09-15: qty 10

## 2016-09-15 MED ORDER — DENOSUMAB 120 MG/1.7ML ~~LOC~~ SOLN
120.0000 mg | Freq: Once | SUBCUTANEOUS | Status: AC
  Administered 2016-09-15: 120 mg via SUBCUTANEOUS
  Filled 2016-09-15: qty 1.7

## 2016-09-15 NOTE — Progress Notes (Signed)
Patient Care Team: [name], MD as PCP - General (Family Medicine)  DIAGNOSIS:  Encounter Diagnosis  Name Primary?  . 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 BRCA reportedly normal.      08/25/2015 Relapse/Recurrence    new solitary liver lesion, biopsy showing metastatic ER PR + breast      09/25/2015 - 03/11/2016 Chemotherapy    Xeloda. Stopped due to progression of liver and bone metastases; pleural metastases      03/31/2016 -  Anti-estrogen oral therapy    Faslodex plus Ibrance (along with Xgeva)       Breast cancer metastasized to liver, unspecified laterality ([place]) (Resolved)   03/27/2016 Initial Diagnosis    Breast cancer metastasized to liver, unspecified laterality ([place])      CHIEF COMPLIANT: Follow-up on Faslodex with Palbociclib  INTERVAL HISTORY: Brittany Porter is a 71 year old with above-mentioned history of metastatic breast cancer currently on Faslodex with Palbociclib. She has been on this for the past 6 months. She is tolerating the treatment fairly well. She is tolerating this much better than Xeloda. Scans done in March 2018 shows stable disease. There were some changes that were concerning in the bones but overall there was stable disease. She denies any new pain or discomfort. She has muscle stiffness and occasional hot flashes.  REVIEW OF SYSTEMS:     Constitutional: Denies fevers, chills or abnormal weight loss Eyes: Denies blurriness of vision Ears, nose, mouth, throat, and face: Denies mucositis or sore throat Respiratory: Denies cough, dyspnea or wheezes Cardiovascular: Denies palpitation, chest discomfort Gastrointestinal:  Denies nausea, heartburn or change in bowel habits Skin: Denies abnormal skin rashes Lymphatics: Denies new lymphadenopathy or easy bruising Neurological:Denies numbness, tingling or new weaknesses Behavioral/Psych: Mood is stable, no new changes  Extremities: No lower extremity edema Breast:  denies any pain or lumps or nodules in either breasts All other systems were reviewed with the patient and are negative.  I have reviewed the past medical history, past surgical history, social history and family history with the patient and they are unchanged from previous note.  ALLERGIES:  is allergic to sulfa antibiotics.  MEDICATIONS:  Current Outpatient Prescriptions  Medication Sig Dispense Refill  . benzonatate (TESSALON) 100 MG capsule Take 1 capsule (100 mg total) by mouth every 8 (eight) hours as needed for cough. 30 capsule 0  . Calcium Carb-Cholecalciferol (CALCIUM 600+D3) 600-800 MG-UNIT TABS Take 1 tablet by mouth 2 (two) times daily.    . Cholecalciferol (VITAMIN D3) 2000 UNITS TABS Take 2,000 mg by mouth daily.    . ferrous sulfate 325 (65 FE) MG tablet Take 325 mg by mouth daily with breakfast.    . fish oil-omega-3 fatty acids 1000 MG capsule Take 1 g by mouth 2 (two) times daily.     . furosemide (LASIX) 40 MG tablet Take 40 mg by mouth daily as needed for  fluid or edema. Reported on 05/07/2015    . glimepiride (AMARYL) 2 MG tablet Take 2 mg by mouth daily with breakfast.    . ibuprofen (ADVIL,MOTRIN) 200 MG tablet Take 400 mg by mouth every 6 (six) hours as needed for headache (pain). Reported on 11/12/2015    . metFORMIN (GLUCOPHAGE) 500 MG tablet Take 500 mg by mouth 2 (two) times daily with a  meal.      . nabumetone (RELAFEN) 750 MG tablet     . nystatin (MYCOSTATIN) powder Apply to right groin 2-3 times a day 30 g 0  . ondansetron (ZOFRAN) 8 MG tablet Take 1 tablet (8 mg total) by mouth every 8 (eight) hours as needed for nausea or vomiting. (Patient not taking: Reported on 05/26/2016) 20 tablet 1  . palbociclib (IBRANCE) 125 MG capsule Take 1 capsule (125 mg total) by mouth daily with breakfast. Take whole with food. 21 capsule 3  . simvastatin (ZOCOR) 40 MG tablet Take 40 mg by mouth at bedtime.       No current facility-administered medications for this visit.     PHYSICAL EXAMINATION: ECOG PERFORMANCE STATUS: 1 - Symptomatic but completely ambulatory  Vitals:   09/15/16 1157  BP: 138/67  Pulse: 87  Resp: 18  Temp: 98 F (36.7 C)   Filed Weights   09/15/16 1157  Weight: 227 lb 11.2 oz (103.3 kg)    GENERAL:alert, no distress and comfortable SKIN: skin color, texture, turgor are normal, no rashes or significant lesions EYES: normal, Conjunctiva are pink and non-injected, sclera clear OROPHARYNX:no exudate, no erythema and lips, buccal mucosa, and tongue normal  NECK: supple, thyroid normal size, non-tender, without nodularity LYMPH:  no palpable lymphadenopathy in the cervical, axillary or inguinal LUNGS: clear to auscultation and percussion with normal breathing effort HEART: regular rate & rhythm and no murmurs and no lower extremity edema ABDOMEN:abdomen soft, non-tender and normal bowel sounds MUSCULOSKELETAL:no cyanosis of digits and no clubbing  NEURO: alert & oriented x 3 with fluent speech, no focal motor/sensory deficits EXTREMITIES: No lower extremity edema  LABORATORY DATA:  I have reviewed the data as listed   Chemistry      Component Value Date/Time   NA 141 09/15/2016 1054   K 4.6 09/15/2016 1054   CL 102 07/12/2012 0956   CO2 29 09/15/2016 1054   BUN 24.1 09/15/2016 1054   CREATININE 0.8 09/15/2016 1054      Component Value Date/Time    CALCIUM 9.3 09/15/2016 1054   ALKPHOS 39 (L) 09/15/2016 1054   AST 24 09/15/2016 1054   ALT 16 09/15/2016 1054   BILITOT 0.98 09/15/2016 1054       Lab Results  Component Value Date   WBC 4.1 09/15/2016   HGB 12.8 09/15/2016   HCT 38.0 09/15/2016   MCV 102.0 (H) 09/15/2016   PLT 202 09/15/2016   NEUTROABS 2.2 09/15/2016    ASSESSMENT & PLAN:  Breast cancer metastasized to liver [facility]) Metastatic breast cancer: recently progressive in liver and bone, also pleura. OnFaslodex + Ibrance beginning 03/31/2016  Toxicities with therapy: 1. Neutropenia: Mild does not require dose adjustment. 2. injection site discomfort very mild 3. Fatigue related to Ibrance  CT chest abdomen pelvis for restaging 07/20/2016: Stable left pleural metastases, right liver lobe metastases unchanged, multiple bone metastases slightly progressive from previous  Return to clinic End of June 2018 for follow-up with me with PET/CT scans done prior to the visit, and monthly for injections and blood work.  I spent 25 minutes talking to the patient of which more than half was spent in counseling and coordination of care.  Orders Placed This Encounter  Procedures  . NM PET Image Restag (PS) Skull Base To Thigh    Standing Status:   Future    Standing Expiration Date:   09/15/2017    Order Specific Question:   Reason for Exam (SYMPTOM  OR DIAGNOSIS REQUIRED)    Answer:   Metastatic breast cancer restaging    Order Specific Question:   If indicated for the ordered procedure, I authorize the administration of a radiopharmaceutical per Radiology protocol    Answer:   Yes    Order Specific Question:   Preferred imaging location?    Answer:   [facility]    Order Specific Question:   Radiology Contrast Protocol - do NOT remove file path    Answer:   [email or web address]   The patient has a good understanding of the overall plan. she agrees with it. she will call with any problems  that may develop before the next visit here.   Rulon Eisenmenger, MD 09/15/16

## 2016-09-15 NOTE — Patient Instructions (Signed)
Denosumab injection What is this medicine? DENOSUMAB (den oh sue mab) slows bone breakdown. Prolia is used to treat osteoporosis in women after menopause and in men. Delton See is used to treat a high calcium level due to cancer and to prevent bone fractures and other bone problems caused by multiple myeloma or cancer bone metastases. Delton See is also used to treat giant cell tumor of the bone. This medicine may be used for other purposes; ask your health care provider or pharmacist if you have questions. COMMON BRAND NAME(S): Prolia, XGEVA What should I tell my health care provider before I take this medicine? They need to know if you have any of these conditions: -dental disease -having surgery or tooth extraction -infection -kidney disease -low levels of calcium or Vitamin D in the blood -malnutrition -on hemodialysis -skin conditions or sensitivity -thyroid or parathyroid disease -an unusual reaction to denosumab, other medicines, foods, dyes, or preservatives -pregnant or trying to get pregnant -breast-feeding How should I use this medicine? This medicine is for injection under the skin. It is given by a health care professional in a hospital or clinic setting. If you are getting Prolia, a special MedGuide will be given to you by the pharmacist with each prescription and refill. Be sure to read this information carefully each time. For Prolia, talk to your pediatrician regarding the use of this medicine in children. Special care may be needed. For Delton See, talk to your pediatrician regarding the use of this medicine in children. While this drug may be prescribed for children as young as 13 years for selected conditions, precautions do apply. Overdosage: If you think you have taken too much of this medicine contact a poison control center or emergency room at once. NOTE: This medicine is only for you. Do not share this medicine with others. What if I miss a dose? It is important not to miss your  dose. Call your doctor or health care professional if you are unable to keep an appointment. What may interact with this medicine? Do not take this medicine with any of the following medications: -other medicines containing denosumab This medicine may also interact with the following medications: -medicines that lower your chance of fighting infection -steroid medicines like prednisone or cortisone This list may not describe all possible interactions. Give your health care provider a list of all the medicines, herbs, non-prescription drugs, or dietary supplements you use. Also tell them if you smoke, drink alcohol, or use illegal drugs. Some items may interact with your medicine. What should I watch for while using this medicine? Visit your doctor or health care professional for regular checks on your progress. Your doctor or health care professional may order blood tests and other tests to see how you are doing. Call your doctor or health care professional for advice if you get a fever, chills or sore throat, or other symptoms of a cold or flu. Do not treat yourself. This drug may decrease your body's ability to fight infection. Try to avoid being around people who are sick. You should make sure you get enough calcium and vitamin D while you are taking this medicine, unless your doctor tells you not to. Discuss the foods you eat and the vitamins you take with your health care professional. See your dentist regularly. Brush and floss your teeth as directed. Before you have any dental work done, tell your dentist you are receiving this medicine. Do not become pregnant while taking this medicine or for 5 months after stopping  it. Talk with your doctor or health care professional about your birth control options while taking this medicine. Women should inform their doctor if they wish to become pregnant or think they might be pregnant. There is a potential for serious side effects to an unborn child. Talk  to your health care professional or pharmacist for more information. What side effects may I notice from receiving this medicine? Side effects that you should report to your doctor or health care professional as soon as possible: -allergic reactions like skin rash, itching or hives, swelling of the face, lips, or tongue -bone pain -breathing problems -dizziness -jaw pain, especially after dental work -redness, blistering, peeling of the skin -signs and symptoms of infection like fever or chills; cough; sore throat; pain or trouble passing urine -signs of low calcium like fast heartbeat, muscle cramps or muscle pain; pain, tingling, numbness in the hands or feet; seizures -unusual bleeding or bruising -unusually weak or tired Side effects that usually do not require medical attention (report to your doctor or health care professional if they continue or are bothersome): -constipation -diarrhea -headache -joint pain -loss of appetite -muscle pain -runny nose -tiredness -upset stomach This list may not describe all possible side effects. Call your doctor for medical advice about side effects. You may report side effects to FDA at 1-800-FDA-1088. Where should I keep my medicine? This medicine is only given in a clinic, doctor's office, or other health care setting and will not be stored at home. NOTE: This sheet is a summary. It may not cover all possible information. If you have questions about this medicine, talk to your doctor, pharmacist, or health care provider.  2018 [employer] Standard (2016-05-24 19:17:21)   Fulvestrant injection What is this medicine? FULVESTRANT (ful VES trant) blocks the effects of estrogen. It is used to treat breast cancer. This medicine may be used for other purposes; ask your health care provider or pharmacist if you have questions. COMMON BRAND NAME(S): FASLODEX What should I tell my health care provider before I take this medicine? They need to  know if you have any of these conditions: -bleeding problems -liver disease -low levels of platelets in the blood -an unusual or allergic reaction to fulvestrant, other medicines, foods, dyes, or preservatives -pregnant or trying to get pregnant -breast-feeding How should I use this medicine? This medicine is for injection into a muscle. It is usually given by a health care professional in a hospital or clinic setting. Talk to your pediatrician regarding the use of this medicine in children. Special care may be needed. Overdosage: If you think you have taken too much of this medicine contact a poison control center or emergency room at once. NOTE: This medicine is only for you. Do not share this medicine with others. What if I miss a dose? It is important not to miss your dose. Call your doctor or health care professional if you are unable to keep an appointment. What may interact with this medicine? -medicines that treat or prevent blood clots like warfarin, enoxaparin, and dalteparin This list may not describe all possible interactions. Give your health care provider a list of all the medicines, herbs, non-prescription drugs, or dietary supplements you use. Also tell them if you smoke, drink alcohol, or use illegal drugs. Some items may interact with your medicine. What should I watch for while using this medicine? Your condition will be monitored carefully while you are receiving this medicine. You will need important blood work done while you   taking this medicine. Do not become pregnant while taking this medicine or for at least 1 year after stopping it. Women of child-bearing potential will need to have a negative pregnancy test before starting this medicine. Women should inform their doctor if they wish to become pregnant or think they might be pregnant. There is a potential for serious side effects to an unborn child. Men should inform their doctors if they wish to father a child. This  medicine may lower sperm counts. Talk to your health care professional or pharmacist for more information. Do not breast-feed an infant while taking this medicine or for 1 year after the last dose. What side effects may I notice from receiving this medicine? Side effects that you should report to your doctor or health care professional as soon as possible: -allergic reactions like skin rash, itching or hives, swelling of the face, lips, or tongue -feeling faint or lightheaded, falls -pain, tingling, numbness, or weakness in the legs -signs and symptoms of infection like fever or chills; cough; flu-like symptoms; sore throat -vaginal bleeding Side effects that usually do not require medical attention (report to your doctor or health care professional if they continue or are bothersome): -aches, pains -constipation -diarrhea -headache -hot flashes -nausea, vomiting -pain at site where injected -stomach pain This list may not describe all possible side effects. Call your doctor for medical advice about side effects. You may report side effects to FDA at 1-800-FDA-1088. Where should I keep my medicine? This drug is given in a hospital or clinic and will not be stored at home. NOTE: This sheet is a summary. It may not cover all possible information. If you have questions about this medicine, talk to your doctor, pharmacist, or health care provider.  2018 [employer]/Gold Standard (2014-11-28 11:03:55)

## 2016-10-13 ENCOUNTER — Ambulatory Visit (HOSPITAL_BASED_OUTPATIENT_CLINIC_OR_DEPARTMENT_OTHER): Payer: Medicare Other

## 2016-10-13 ENCOUNTER — Other Ambulatory Visit (HOSPITAL_BASED_OUTPATIENT_CLINIC_OR_DEPARTMENT_OTHER): Payer: Medicare Other

## 2016-10-13 DIAGNOSIS — C7951 Secondary malignant neoplasm of bone: Secondary | ICD-10-CM

## 2016-10-13 DIAGNOSIS — C50919 Malignant neoplasm of unspecified site of unspecified female breast: Secondary | ICD-10-CM | POA: Diagnosis not present

## 2016-10-13 DIAGNOSIS — C787 Secondary malignant neoplasm of liver and intrahepatic bile duct: Principal | ICD-10-CM

## 2016-10-13 DIAGNOSIS — Z5111 Encounter for antineoplastic chemotherapy: Secondary | ICD-10-CM | POA: Diagnosis not present

## 2016-10-13 LAB — CBC WITH DIFFERENTIAL/PLATELET
BASO%: 1.1 % (ref 0.0–2.0)
Basophils Absolute: 0 10*3/uL (ref 0.0–0.1)
EOS%: 1 % (ref 0.0–7.0)
Eosinophils Absolute: 0 10*3/uL (ref 0.0–0.5)
HCT: 35.5 % (ref 34.8–46.6)
HGB: 12.1 g/dL (ref 11.6–15.9)
LYMPH%: 29.3 % (ref 14.0–49.7)
MCH: 35.2 pg — ABNORMAL HIGH (ref 25.1–34.0)
MCHC: 34.2 g/dL (ref 31.5–36.0)
MCV: 102.9 fL — ABNORMAL HIGH (ref 79.5–101.0)
MONO#: 0.2 10*3/uL (ref 0.1–0.9)
MONO%: 6.7 % (ref 0.0–14.0)
NEUT#: 2 10*3/uL (ref 1.5–6.5)
NEUT%: 61.9 % (ref 38.4–76.8)
Platelets: 161 10*3/uL (ref 145–400)
RBC: 3.45 10*6/uL — ABNORMAL LOW (ref 3.70–5.45)
RDW: 15.3 % — ABNORMAL HIGH (ref 11.2–14.5)
WBC: 3.2 10*3/uL — ABNORMAL LOW (ref 3.9–10.3)
lymph#: 0.9 10*3/uL (ref 0.9–3.3)

## 2016-10-13 LAB — COMPREHENSIVE METABOLIC PANEL
ALT: 13 U/L (ref 0–55)
AST: 18 U/L (ref 5–34)
Albumin: 3.6 g/dL (ref 3.5–5.0)
Alkaline Phosphatase: 49 U/L (ref 40–150)
Anion Gap: 7 mEq/L (ref 3–11)
BUN: 22.9 mg/dL (ref 7.0–26.0)
CO2: 28 mEq/L (ref 22–29)
Calcium: 9.2 mg/dL (ref 8.4–10.4)
Chloride: 106 mEq/L (ref 98–109)
Creatinine: 0.8 mg/dL (ref 0.6–1.1)
EGFR: 71 mL/min/{1.73_m2} — ABNORMAL LOW (ref >90)
Glucose: 189 mg/dl — ABNORMAL HIGH (ref 70–140)
Potassium: 5.1 mEq/L (ref 3.5–5.1)
Sodium: 142 mEq/L (ref 136–145)
Total Bilirubin: 1.09 mg/dL (ref 0.20–1.20)
Total Protein: 6.3 g/dL — ABNORMAL LOW (ref 6.4–8.3)

## 2016-10-13 MED ORDER — DENOSUMAB 120 MG/1.7ML ~~LOC~~ SOLN
120.0000 mg | Freq: Once | SUBCUTANEOUS | Status: AC
  Administered 2016-10-13: 120 mg via SUBCUTANEOUS
  Filled 2016-10-13: qty 1.7

## 2016-10-13 MED ORDER — FULVESTRANT 250 MG/5ML IM SOLN
500.0000 mg | Freq: Once | INTRAMUSCULAR | Status: AC
  Administered 2016-10-13: 500 mg via INTRAMUSCULAR
  Filled 2016-10-13: qty 10

## 2016-10-13 NOTE — Patient Instructions (Signed)
Fulvestrant injection What is this medicine? FULVESTRANT (ful VES trant) blocks the effects of estrogen. It is used to treat breast cancer. This medicine may be used for other purposes; ask your health care provider or pharmacist if you have questions. COMMON BRAND NAME(S): FASLODEX What should I tell my health care provider before I take this medicine? They need to know if you have any of these conditions: -bleeding problems -liver disease -low levels of platelets in the blood -an unusual or allergic reaction to fulvestrant, other medicines, foods, dyes, or preservatives -pregnant or trying to get pregnant -breast-feeding How should I use this medicine? This medicine is for injection into a muscle. It is usually given by a health care professional in a hospital or clinic setting. Talk to your pediatrician regarding the use of this medicine in children. Special care may be needed. Overdosage: If you think you have taken too much of this medicine contact a poison control center or emergency room at once. NOTE: This medicine is only for you. Do not share this medicine with others. What if I miss a dose? It is important not to miss your dose. Call your doctor or health care professional if you are unable to keep an appointment. What may interact with this medicine? -medicines that treat or prevent blood clots like warfarin, enoxaparin, and dalteparin This list may not describe all possible interactions. Give your health care provider a list of all the medicines, herbs, non-prescription drugs, or dietary supplements you use. Also tell them if you smoke, drink alcohol, or use illegal drugs. Some items may interact with your medicine. What should I watch for while using this medicine? Your condition will be monitored carefully while you are receiving this medicine. You will need important blood work done while you are taking this medicine. Do not become pregnant while taking this medicine or for  at least 1 year after stopping it. Women of child-bearing potential will need to have a negative pregnancy test before starting this medicine. Women should inform their doctor if they wish to become pregnant or think they might be pregnant. There is a potential for serious side effects to an unborn child. Men should inform their doctors if they wish to father a child. This medicine may lower sperm counts. Talk to your health care professional or pharmacist for more information. Do not breast-feed an infant while taking this medicine or for 1 year after the last dose. What side effects may I notice from receiving this medicine? Side effects that you should report to your doctor or health care professional as soon as possible: -allergic reactions like skin rash, itching or hives, swelling of the face, lips, or tongue -feeling faint or lightheaded, falls -pain, tingling, numbness, or weakness in the legs -signs and symptoms of infection like fever or chills; cough; flu-like symptoms; sore throat -vaginal bleeding Side effects that usually do not require medical attention (report to your doctor or health care professional if they continue or are bothersome): -aches, pains -constipation -diarrhea -headache -hot flashes -nausea, vomiting -pain at site where injected -stomach pain This list may not describe all possible side effects. Call your doctor for medical advice about side effects. You may report side effects to FDA at 1-800-FDA-1088. Where should I keep my medicine? This drug is given in a hospital or clinic and will not be stored at home. NOTE: This sheet is a summary. It may not cover all possible information. If you have questions about this medicine, talk to your   doctor, pharmacist, or health care provider.  2018 [employer] Standard (2014-11-28 11:03:55) Denosumab injection What is this medicine? DENOSUMAB (den oh sue mab) slows bone breakdown. Prolia is used to treat osteoporosis in  women after menopause and in men. Xgeva is used to treat a high calcium level due to cancer and to prevent bone fractures and other bone problems caused by multiple myeloma or cancer bone metastases. Xgeva is also used to treat giant cell tumor of the bone. This medicine may be used for other purposes; ask your health care provider or pharmacist if you have questions. COMMON BRAND NAME(S): Prolia, XGEVA What should I tell my health care provider before I take this medicine? They need to know if you have any of these conditions: -dental disease -having surgery or tooth extraction -infection -kidney disease -low levels of calcium or Vitamin D in the blood -malnutrition -on hemodialysis -skin conditions or sensitivity -thyroid or parathyroid disease -an unusual reaction to denosumab, other medicines, foods, dyes, or preservatives -pregnant or trying to get pregnant -breast-feeding How should I use this medicine? This medicine is for injection under the skin. It is given by a health care professional in a hospital or clinic setting. If you are getting Prolia, a special MedGuide will be given to you by the pharmacist with each prescription and refill. Be sure to read this information carefully each time. For Prolia, talk to your pediatrician regarding the use of this medicine in children. Special care may be needed. For Xgeva, talk to your pediatrician regarding the use of this medicine in children. While this drug may be prescribed for children as young as 13 years for selected conditions, precautions do apply. Overdosage: If you think you have taken too much of this medicine contact a poison control center or emergency room at once. NOTE: This medicine is only for you. Do not share this medicine with others. What if I miss a dose? It is important not to miss your dose. Call your doctor or health care professional if you are unable to keep an appointment. What may interact with this  medicine? Do not take this medicine with any of the following medications: -other medicines containing denosumab This medicine may also interact with the following medications: -medicines that lower your chance of fighting infection -steroid medicines like prednisone or cortisone This list may not describe all possible interactions. Give your health care provider a list of all the medicines, herbs, non-prescription drugs, or dietary supplements you use. Also tell them if you smoke, drink alcohol, or use illegal drugs. Some items may interact with your medicine. What should I watch for while using this medicine? Visit your doctor or health care professional for regular checks on your progress. Your doctor or health care professional may order blood tests and other tests to see how you are doing. Call your doctor or health care professional for advice if you get a fever, chills or sore throat, or other symptoms of a cold or flu. Do not treat yourself. This drug may decrease your body's ability to fight infection. Try to avoid being around people who are sick. You should make sure you get enough calcium and vitamin D while you are taking this medicine, unless your doctor tells you not to. Discuss the foods you eat and the vitamins you take with your health care professional. See your dentist regularly. Brush and floss your teeth as directed. Before you have any dental work done, tell your dentist you are receiving this medicine. Do   not become pregnant while taking this medicine or for 5 months after stopping it. Talk with your doctor or health care professional about your birth control options while taking this medicine. Women should inform their doctor if they wish to become pregnant or think they might be pregnant. There is a potential for serious side effects to an unborn child. Talk to your health care professional or pharmacist for more information. What side effects may I notice from receiving this  medicine? Side effects that you should report to your doctor or health care professional as soon as possible: -allergic reactions like skin rash, itching or hives, swelling of the face, lips, or tongue -bone pain -breathing problems -dizziness -jaw pain, especially after dental work -redness, blistering, peeling of the skin -signs and symptoms of infection like fever or chills; cough; sore throat; pain or trouble passing urine -signs of low calcium like fast heartbeat, muscle cramps or muscle pain; pain, tingling, numbness in the hands or feet; seizures -unusual bleeding or bruising -unusually weak or tired Side effects that usually do not require medical attention (report to your doctor or health care professional if they continue or are bothersome): -constipation -diarrhea -headache -joint pain -loss of appetite -muscle pain -runny nose -tiredness -upset stomach This list may not describe all possible side effects. Call your doctor for medical advice about side effects. You may report side effects to FDA at 1-800-FDA-1088. Where should I keep my medicine? This medicine is only given in a clinic, doctor's office, or other health care setting and will not be stored at home. NOTE: This sheet is a summary. It may not cover all possible information. If you have questions about this medicine, talk to your doctor, pharmacist, or health care provider.  2018 [employer] Standard (2016-05-24 19:17:21)  

## 2016-10-21 ENCOUNTER — Encounter: Payer: Self-pay | Admitting: *Deleted

## 2016-10-21 ENCOUNTER — Other Ambulatory Visit: Payer: Self-pay

## 2016-10-21 DIAGNOSIS — C50919 Malignant neoplasm of unspecified site of unspecified female breast: Secondary | ICD-10-CM

## 2016-10-21 MED ORDER — PALBOCICLIB 125 MG PO CAPS: 125.0000 mg | ORAL_CAPSULE | Freq: Every day | ORAL | 3 refills | Status: DC

## 2016-10-21 NOTE — Progress Notes (Signed)
Pt called to request more refill of her Ibrance medication. Will call in refill prescription today.

## 2016-11-04 ENCOUNTER — Other Ambulatory Visit: Payer: Self-pay | Admitting: Emergency Medicine

## 2016-11-10 ENCOUNTER — Other Ambulatory Visit: Payer: Self-pay

## 2016-11-10 ENCOUNTER — Ambulatory Visit (HOSPITAL_BASED_OUTPATIENT_CLINIC_OR_DEPARTMENT_OTHER): Payer: Medicare Other

## 2016-11-10 ENCOUNTER — Ambulatory Visit: Payer: Medicare Other | Admitting: Hematology and Oncology

## 2016-11-10 ENCOUNTER — Other Ambulatory Visit: Payer: Medicare Other

## 2016-11-10 DIAGNOSIS — C787 Secondary malignant neoplasm of liver and intrahepatic bile duct: Secondary | ICD-10-CM | POA: Diagnosis not present

## 2016-11-10 DIAGNOSIS — C7951 Secondary malignant neoplasm of bone: Secondary | ICD-10-CM | POA: Diagnosis not present

## 2016-11-10 DIAGNOSIS — C569 Malignant neoplasm of unspecified ovary: Secondary | ICD-10-CM

## 2016-11-10 DIAGNOSIS — C50919 Malignant neoplasm of unspecified site of unspecified female breast: Secondary | ICD-10-CM

## 2016-11-10 DIAGNOSIS — Z5111 Encounter for antineoplastic chemotherapy: Secondary | ICD-10-CM

## 2016-11-10 LAB — CBC WITH DIFFERENTIAL/PLATELET
BASO%: 1.1 % (ref 0.0–2.0)
Basophils Absolute: 0 10*3/uL (ref 0.0–0.1)
EOS%: 1.7 % (ref 0.0–7.0)
Eosinophils Absolute: 0.1 10*3/uL (ref 0.0–0.5)
HCT: 38.2 % (ref 34.8–46.6)
HGB: 12.9 g/dL (ref 11.6–15.9)
LYMPH%: 42.2 % (ref 14.0–49.7)
MCH: 34.4 pg — ABNORMAL HIGH (ref 25.1–34.0)
MCHC: 33.6 g/dL (ref 31.5–36.0)
MCV: 102.3 fL — ABNORMAL HIGH (ref 79.5–101.0)
MONO#: 0.4 10*3/uL (ref 0.1–0.9)
MONO%: 8.1 % (ref 0.0–14.0)
NEUT#: 2.1 10*3/uL (ref 1.5–6.5)
NEUT%: 46.9 % (ref 38.4–76.8)
Platelets: 205 10*3/uL (ref 145–400)
RBC: 3.74 10*6/uL (ref 3.70–5.45)
RDW: 15.4 % — ABNORMAL HIGH (ref 11.2–14.5)
WBC: 4.6 10*3/uL (ref 3.9–10.3)
lymph#: 1.9 10*3/uL (ref 0.9–3.3)

## 2016-11-10 LAB — COMPREHENSIVE METABOLIC PANEL
ALT: 20 U/L (ref 0–55)
AST: 34 U/L (ref 5–34)
Albumin: 3.7 g/dL (ref 3.5–5.0)
Alkaline Phosphatase: 50 U/L (ref 40–150)
Anion Gap: 11 mEq/L (ref 3–11)
BUN: 24 mg/dL (ref 7.0–26.0)
CO2: 31 mEq/L — ABNORMAL HIGH (ref 22–29)
Calcium: 9.6 mg/dL (ref 8.4–10.4)
Chloride: 102 mEq/L (ref 98–109)
Creatinine: 0.9 mg/dL (ref 0.6–1.1)
EGFR: 62 mL/min/{1.73_m2} — ABNORMAL LOW (ref >90)
Glucose: 61 mg/dl — ABNORMAL LOW (ref 70–140)
Potassium: 5 mEq/L (ref 3.5–5.1)
Sodium: 143 mEq/L (ref 136–145)
Total Bilirubin: 0.83 mg/dL (ref 0.20–1.20)
Total Protein: 6.8 g/dL (ref 6.4–8.3)

## 2016-11-10 MED ORDER — DENOSUMAB 120 MG/1.7ML ~~LOC~~ SOLN
120.0000 mg | Freq: Once | SUBCUTANEOUS | Status: AC
  Administered 2016-11-10: 120 mg via SUBCUTANEOUS
  Filled 2016-11-10: qty 1.7

## 2016-11-10 MED ORDER — FULVESTRANT 250 MG/5ML IM SOLN
500.0000 mg | Freq: Once | INTRAMUSCULAR | Status: AC
  Administered 2016-11-10: 500 mg via INTRAMUSCULAR
  Filled 2016-11-10: qty 10

## 2016-11-14 ENCOUNTER — Ambulatory Visit (HOSPITAL_COMMUNITY)
Admission: RE | Admit: 2016-11-14 | Discharge: 2016-11-14 | Disposition: A | Discharge status: Home / Self Care | Payer: Medicare Other | Source: Ambulatory Visit | Attending: Hematology and Oncology | Admitting: Hematology and Oncology

## 2016-11-14 DIAGNOSIS — C787 Secondary malignant neoplasm of liver and intrahepatic bile duct: Secondary | ICD-10-CM | POA: Insufficient documentation

## 2016-11-14 DIAGNOSIS — C50919 Malignant neoplasm of unspecified site of unspecified female breast: Secondary | ICD-10-CM

## 2016-11-14 DIAGNOSIS — C7951 Secondary malignant neoplasm of bone: Secondary | ICD-10-CM | POA: Insufficient documentation

## 2016-11-14 LAB — GLUCOSE, CAPILLARY: Glucose-Capillary: 173 mg/dL — ABNORMAL HIGH (ref 65–99)

## 2016-11-14 MED ORDER — FLUDEOXYGLUCOSE F - 18 (FDG) INJECTION
11.9000 | Freq: Once | INTRAVENOUS | Status: AC | PRN
  Administered 2016-11-14: 11.9 via INTRAVENOUS

## 2016-11-15 ENCOUNTER — Other Ambulatory Visit: Payer: Medicare Other

## 2016-11-15 ENCOUNTER — Ambulatory Visit (HOSPITAL_BASED_OUTPATIENT_CLINIC_OR_DEPARTMENT_OTHER): Payer: Medicare Other | Admitting: Hematology and Oncology

## 2016-11-15 ENCOUNTER — Encounter: Payer: Self-pay | Admitting: Hematology and Oncology

## 2016-11-15 DIAGNOSIS — R530 Neoplastic (malignant) related fatigue: Secondary | ICD-10-CM

## 2016-11-15 DIAGNOSIS — C50919 Malignant neoplasm of unspecified site of unspecified female breast: Secondary | ICD-10-CM | POA: Diagnosis not present

## 2016-11-15 DIAGNOSIS — C787 Secondary malignant neoplasm of liver and intrahepatic bile duct: Secondary | ICD-10-CM | POA: Diagnosis not present

## 2016-11-15 DIAGNOSIS — C7951 Secondary malignant neoplasm of bone: Secondary | ICD-10-CM | POA: Diagnosis not present

## 2016-11-15 DIAGNOSIS — C782 Secondary malignant neoplasm of pleura: Secondary | ICD-10-CM

## 2016-11-15 NOTE — Assessment & Plan Note (Signed)
Metastatic breast cancer: recently progressive in liver and bone, also pleura. OnFaslodex + Ibrance beginning 03/31/2016  Toxicities with therapy: 1. Neutropenia: Mild does not require dose adjustment. 2. injection site discomfort very mild 3. Fatigue related to Ibrance  CT chest abdomen pelvis for restaging 07/20/2016: Stable left pleural metastases, right liver lobe metastases unchanged, multiple bone metastases slightly progressive from previous  PET/CT scan 36/14/4315: Hypermetabolic on the left pleural space and hepatic metastases have improved. Osseous metastatic disease has progressed compared to prior PET/CT scan done 8 months ago but not at the CT scans done 3 months ago  Radiology review: Discussed the PET scan report and suggested that she is responding to treatment and that we should continue with the same treatment for now.  Return to clinic in 3 months for follow-up

## 2016-11-15 NOTE — Progress Notes (Signed)
Patient Care Team: [name], MD as PCP - General (Family Medicine)  DIAGNOSIS:  Encounter Diagnosis  Name Primary?  . 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)     SUMMARY OF ONCOLOGIC HISTORY:   Breast cancer metastasized to liver [facility])   1997 Initial Diagnosis    bilateral breast cancers 1997 treated with bilateral mastectomies and axillary node dissections, adjuvant chemotherapy and radiation.      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 BRCA reportedly normal.      08/25/2015 Relapse/Recurrence    new solitary liver lesion, biopsy showing metastatic ER PR + breast      09/25/2015 - 03/11/2016 Chemotherapy    Xeloda. Stopped due to progression of liver and bone metastases; pleural metastases      03/31/2016 -  Anti-estrogen oral therapy    Faslodex plus Ibrance (along with Xgeva)       Breast cancer metastasized to liver, unspecified laterality ([place]) (Resolved)   03/27/2016 Initial Diagnosis    Breast cancer metastasized to liver, unspecified laterality ([place])      CHIEF COMPLIANT: Follow-up of metastatic breast cancer after recent PET CT scan   INTERVAL HISTORY: Brittany Porter is a 71 year old with above-mentioned is metastatic breast cancer currently on Faslodex with Palbociclib. She is tolerating the treatment extremely well without any major problems other than fatigue. She had recent PET/CT scan is here to discuss the results. She denies any pain or discomfort.  REVIEW OF SYSTEMS:   Constitutional: Denies fevers, chills or abnormal weight loss Eyes: Denies blurriness of vision Ears, nose, mouth, throat, and face: Denies mucositis or sore  throat Respiratory: Denies cough, dyspnea or wheezes Cardiovascular: Denies palpitation, chest discomfort Gastrointestinal:  Denies nausea, heartburn or change in bowel habits Skin: Denies abnormal skin rashes Lymphatics: Denies new lymphadenopathy or easy bruising Neurological:Denies numbness, tingling or new weaknesses Behavioral/Psych: Mood is stable, no new changes  Extremities: No lower extremity edema  All other systems were reviewed with the patient and are negative.  I have reviewed the past medical history, past surgical history, social history and family history with the patient and they are unchanged from previous note.  ALLERGIES:  is allergic to sulfa antibiotics.  MEDICATIONS:  Current Outpatient Prescriptions  Medication Sig Dispense Refill  . benzonatate (TESSALON) 100 MG capsule Take 1 capsule (100 mg total) by mouth every 8 (eight) hours as needed for cough. 30 capsule 0  . Calcium Carb-Cholecalciferol (CALCIUM 600+D3) 600-800 MG-UNIT TABS Take 1 tablet by mouth 2 (two) times daily.    . Cholecalciferol (VITAMIN D3) 2000 UNITS TABS Take 2,000 mg by mouth daily.    . ferrous sulfate 325 (65 FE) MG tablet Take 325 mg by mouth daily with breakfast.    . fish oil-omega-3 fatty acids 1000 MG capsule Take 1 g by mouth 2 (two) times daily.     . furosemide (LASIX) 40 MG tablet Take 40 mg by mouth daily as needed for fluid or edema. Reported on 05/07/2015    . glimepiride (AMARYL) 2 MG tablet Take 2 mg by mouth daily with breakfast.    . ibuprofen (ADVIL,MOTRIN) 200 MG tablet Take 400 mg by mouth every 6 (six)  hours as needed for headache (pain). Reported on 11/12/2015    . metFORMIN (GLUCOPHAGE) 500 MG tablet Take 500 mg by mouth 2 (two) times daily with a meal.      . nabumetone (RELAFEN) 750 MG tablet     . nystatin (MYCOSTATIN) powder Apply to right groin 2-3 times a day 30 g 0  . ondansetron (ZOFRAN) 8 MG tablet Take 1 tablet (8 mg total) by mouth every 8 (eight) hours as  needed for nausea or vomiting. (Patient not taking: Reported on 05/26/2016) 20 tablet 1  . palbociclib (IBRANCE) 125 MG capsule Take 1 capsule (125 mg total) by mouth daily with breakfast. Take whole with food. 21 capsule 3  . simvastatin (ZOCOR) 40 MG tablet Take 40 mg by mouth at bedtime.       No current facility-administered medications for this visit.     PHYSICAL EXAMINATION: ECOG PERFORMANCE STATUS: 1 - Symptomatic but completely ambulatory  Vitals:   11/15/16 1521  BP: (!) 156/94  Pulse: 86  Resp: 18  Temp: 98.3 F (36.8 C)   Filed Weights   11/15/16 1521  Weight: 228 lb 6.4 oz (103.6 kg)    GENERAL:alert, no distress and comfortable SKIN: skin color, texture, turgor are normal, no rashes or significant lesions EYES: normal, Conjunctiva are pink and non-injected, sclera clear OROPHARYNX:no exudate, no erythema and lips, buccal mucosa, and tongue normal  NECK: supple, thyroid normal size, non-tender, without nodularity LYMPH:  no palpable lymphadenopathy in the cervical, axillary or inguinal LUNGS: clear to auscultation and percussion with normal breathing effort HEART: regular rate & rhythm and no murmurs and no lower extremity edema ABDOMEN:abdomen soft, non-tender and normal bowel sounds MUSCULOSKELETAL:no cyanosis of digits and no clubbing  NEURO: alert & oriented x 3 with fluent speech, no focal motor/sensory deficits EXTREMITIES: No lower extremity edema  LABORATORY DATA:  I have reviewed the data as listed   Chemistry      Component Value Date/Time   NA 143 11/10/2016 1215   K 5.0 11/10/2016 1215   CL 102 07/12/2012 0956   CO2 31 (H) 11/10/2016 1215   BUN 24.0 11/10/2016 1215   CREATININE 0.9 11/10/2016 1215      Component Value Date/Time   CALCIUM 9.6 11/10/2016 1215   ALKPHOS 50 11/10/2016 1215   AST 34 11/10/2016 1215   ALT 20 11/10/2016 1215   BILITOT 0.83 11/10/2016 1215       Lab Results  Component Value Date   WBC 4.6 11/10/2016   HGB  12.9 11/10/2016   HCT 38.2 11/10/2016   MCV 102.3 (H) 11/10/2016   PLT 205 11/10/2016   NEUTROABS 2.1 11/10/2016    ASSESSMENT & PLAN:  Breast cancer metastasized to liver [facility]) Metastatic breast cancer: recently progressive in liver and bone, also pleura. OnFaslodex + Ibrance beginning 03/31/2016  Toxicities with therapy: 1. Neutropenia: Mild does not require dose adjustment. 2. injection site discomfort very mild 3. Fatigue related to Ibrance  CT chest abdomen pelvis for restaging 07/20/2016: Stable left pleural metastases, right liver lobe metastases unchanged, multiple bone metastases slightly progressive from previous  PET/CT scan 16/60/6301: Hypermetabolic on the left pleural space and hepatic metastases have improved. Osseous metastatic disease has progressed compared to prior PET/CT scan done 8 months ago but not at the CT scans done 3 months ago  Radiology review: Discussed the PET scan report and suggested that she is responding to treatment and that we should continue with the same treatment for now.  Return to clinic in 3 months for follow-up We will change her Xgeva injections to every 3 months. Her plan is to obtain scans every 6 months.  I spent 25 minutes talking to the patient of which more than half was spent in counseling and coordination of care.  No orders of the defined types were placed in this encounter.  The patient has a good understanding of the overall plan. she agrees with it. she will call with any problems that may develop before the next visit here.   Rulon Eisenmenger, MD 11/15/16

## 2016-12-07 ENCOUNTER — Other Ambulatory Visit: Payer: Self-pay | Admitting: Hematology and Oncology

## 2016-12-07 ENCOUNTER — Other Ambulatory Visit: Payer: Self-pay

## 2016-12-07 DIAGNOSIS — C50912 Malignant neoplasm of unspecified site of left female breast: Principal | ICD-10-CM

## 2016-12-07 DIAGNOSIS — C50911 Malignant neoplasm of unspecified site of right female breast: Secondary | ICD-10-CM

## 2016-12-08 ENCOUNTER — Other Ambulatory Visit: Payer: Medicare Other

## 2016-12-08 ENCOUNTER — Ambulatory Visit (HOSPITAL_BASED_OUTPATIENT_CLINIC_OR_DEPARTMENT_OTHER): Payer: Medicare Other

## 2016-12-08 VITALS — BP 140/68 | HR 85 | Temp 98.1°F | Resp 20

## 2016-12-08 DIAGNOSIS — C782 Secondary malignant neoplasm of pleura: Secondary | ICD-10-CM | POA: Diagnosis not present

## 2016-12-08 DIAGNOSIS — C50919 Malignant neoplasm of unspecified site of unspecified female breast: Secondary | ICD-10-CM

## 2016-12-08 DIAGNOSIS — C7951 Secondary malignant neoplasm of bone: Secondary | ICD-10-CM

## 2016-12-08 DIAGNOSIS — C787 Secondary malignant neoplasm of liver and intrahepatic bile duct: Secondary | ICD-10-CM

## 2016-12-08 DIAGNOSIS — Z5111 Encounter for antineoplastic chemotherapy: Secondary | ICD-10-CM

## 2016-12-08 MED ORDER — FULVESTRANT 250 MG/5ML IM SOLN
500.0000 mg | Freq: Once | INTRAMUSCULAR | Status: AC
  Administered 2016-12-08: 500 mg via INTRAMUSCULAR
  Filled 2016-12-08: qty 10

## 2016-12-08 NOTE — Patient Instructions (Signed)
Denosumab injection What is this medicine? DENOSUMAB (den oh sue mab) slows bone breakdown. Prolia is used to treat osteoporosis in women after menopause and in men. Xgeva is used to prevent bone fractures and other bone problems caused by cancer bone metastases. Xgeva is also used to treat giant cell tumor of the bone. COMMON BRAND NAME(S): Prolia, XGEVA What should I tell my health care provider before I take this medicine? They need to know if you have any of these conditions: -dental disease -eczema -infection or history of infections -kidney disease or on dialysis -low blood calcium or vitamin D -malabsorption syndrome -scheduled to have surgery or tooth extraction -taking medicine that contains denosumab -thyroid or parathyroid disease -an unusual reaction to denosumab, other medicines, foods, dyes, or preservatives -pregnant or trying to get pregnant -breast-feeding How should I use this medicine? This medicine is for injection under the skin. It is given by a health care professional in a hospital or clinic setting. If you are getting Prolia, a special MedGuide will be given to you by the pharmacist with each prescription and refill. Be sure to read this information carefully each time. For Prolia, talk to your pediatrician regarding the use of this medicine in children. Special care may be needed. For Xgeva, talk to your pediatrician regarding the use of this medicine in children. While this drug may be prescribed for children as young as 13 years for selected conditions, precautions do apply. What if I miss a dose? It is important not to miss your dose. Call your doctor or health care professional if you are unable to keep an appointment. What may interact with this medicine? Do not take this medicine with any of the following medications: -other medicines containing denosumab This medicine may also interact with the following medications: -medicines that suppress the immune  system -medicines that treat cancer -steroid medicines like prednisone or cortisone What should I watch for while using this medicine? Visit your doctor or health care professional for regular checks on your progress. Your doctor or health care professional may order blood tests and other tests to see how you are doing. Call your doctor or health care professional if you get a cold or other infection while receiving this medicine. Do not treat yourself. This medicine may decrease your body's ability to fight infection. You should make sure you get enough calcium and vitamin D while you are taking this medicine, unless your doctor tells you not to. Discuss the foods you eat and the vitamins you take with your health care professional. See your dentist regularly. Brush and floss your teeth as directed. Before you have any dental work done, tell your dentist you are receiving this medicine. Do not become pregnant while taking this medicine or for 5 months after stopping it. Women should inform their doctor if they wish to become pregnant or think they might be pregnant. There is a potential for serious side effects to an unborn child. Talk to your health care professional or pharmacist for more information. What side effects may I notice from receiving this medicine? Side effects that you should report to your doctor or health care professional as soon as possible: -allergic reactions like skin rash, itching or hives, swelling of the face, lips, or tongue -breathing problems -chest pain -fast, irregular heartbeat -feeling faint or lightheaded, falls -fever, chills, or any other sign of infection -muscle spasms, tightening, or twitches -numbness or tingling -skin blisters or bumps, or is dry, peels, or red -slow   healing or unexplained pain in the mouth or jaw -unusual bleeding or bruising Side effects that usually do not require medical attention (report to your doctor or health care professional  if they continue or are bothersome): -muscle pain -stomach upset, gas Where should I keep my medicine? This medicine is only given in a clinic, doctor's office, or other health care setting and will not be stored at home.  2017 Elsevier/Gold Standard (2015-06-04 10:06:55)  

## 2017-01-05 ENCOUNTER — Other Ambulatory Visit: Payer: Self-pay

## 2017-01-05 ENCOUNTER — Ambulatory Visit (HOSPITAL_BASED_OUTPATIENT_CLINIC_OR_DEPARTMENT_OTHER): Payer: Medicare Other

## 2017-01-05 VITALS — BP 150/74 | HR 79 | Temp 97.9°F | Resp 18

## 2017-01-05 DIAGNOSIS — C50919 Malignant neoplasm of unspecified site of unspecified female breast: Secondary | ICD-10-CM

## 2017-01-05 DIAGNOSIS — Z5111 Encounter for antineoplastic chemotherapy: Secondary | ICD-10-CM | POA: Diagnosis not present

## 2017-01-05 MED ORDER — PALBOCICLIB 125 MG PO CAPS: 125.0000 mg | ORAL_CAPSULE | Freq: Every day | ORAL | 3 refills | Status: DC

## 2017-01-05 MED ORDER — FULVESTRANT 250 MG/5ML IM SOLN
500.0000 mg | Freq: Once | INTRAMUSCULAR | Status: AC
  Administered 2017-01-05: 500 mg via INTRAMUSCULAR
  Filled 2017-01-05: qty 10

## 2017-01-05 NOTE — Patient Instructions (Signed)

## 2017-02-02 ENCOUNTER — Ambulatory Visit (HOSPITAL_BASED_OUTPATIENT_CLINIC_OR_DEPARTMENT_OTHER): Payer: Medicare Other

## 2017-02-02 ENCOUNTER — Other Ambulatory Visit (HOSPITAL_BASED_OUTPATIENT_CLINIC_OR_DEPARTMENT_OTHER): Payer: Medicare Other

## 2017-02-02 VITALS — BP 152/82 | HR 81 | Temp 98.2°F | Resp 18

## 2017-02-02 DIAGNOSIS — C7951 Secondary malignant neoplasm of bone: Secondary | ICD-10-CM | POA: Diagnosis not present

## 2017-02-02 DIAGNOSIS — C50919 Malignant neoplasm of unspecified site of unspecified female breast: Secondary | ICD-10-CM

## 2017-02-02 DIAGNOSIS — Z5111 Encounter for antineoplastic chemotherapy: Secondary | ICD-10-CM | POA: Diagnosis not present

## 2017-02-02 DIAGNOSIS — C787 Secondary malignant neoplasm of liver and intrahepatic bile duct: Principal | ICD-10-CM

## 2017-02-02 LAB — COMPREHENSIVE METABOLIC PANEL
ALT: 13 U/L (ref 0–55)
AST: 24 U/L (ref 5–34)
Albumin: 3.7 g/dL (ref 3.5–5.0)
Alkaline Phosphatase: 43 U/L (ref 40–150)
Anion Gap: 9 mEq/L (ref 3–11)
BUN: 21.2 mg/dL (ref 7.0–26.0)
CO2: 29 mEq/L (ref 22–29)
Calcium: 9.3 mg/dL (ref 8.4–10.4)
Chloride: 105 mEq/L (ref 98–109)
Creatinine: 0.8 mg/dL (ref 0.6–1.1)
EGFR: 73 mL/min/{1.73_m2} — ABNORMAL LOW (ref >90)
Glucose: 165 mg/dl — ABNORMAL HIGH (ref 70–140)
Potassium: 5 mEq/L (ref 3.5–5.1)
Sodium: 143 mEq/L (ref 136–145)
Total Bilirubin: 1.04 mg/dL (ref 0.20–1.20)
Total Protein: 6.6 g/dL (ref 6.4–8.3)

## 2017-02-02 LAB — CBC WITH DIFFERENTIAL/PLATELET
BASO%: 0.6 % (ref 0.0–2.0)
Basophils Absolute: 0 10*3/uL (ref 0.0–0.1)
EOS%: 1.7 % (ref 0.0–7.0)
Eosinophils Absolute: 0.1 10*3/uL (ref 0.0–0.5)
HCT: 36.6 % (ref 34.8–46.6)
HGB: 12.3 g/dL (ref 11.6–15.9)
LYMPH%: 30.4 % (ref 14.0–49.7)
MCH: 34.6 pg — ABNORMAL HIGH (ref 25.1–34.0)
MCHC: 33.6 g/dL (ref 31.5–36.0)
MCV: 103.1 fL — ABNORMAL HIGH (ref 79.5–101.0)
MONO#: 0.3 10*3/uL (ref 0.1–0.9)
MONO%: 7.6 % (ref 0.0–14.0)
NEUT#: 2.1 10*3/uL (ref 1.5–6.5)
NEUT%: 59.7 % (ref 38.4–76.8)
Platelets: 154 10*3/uL (ref 145–400)
RBC: 3.55 10*6/uL — ABNORMAL LOW (ref 3.70–5.45)
RDW: 14.2 % (ref 11.2–14.5)
WBC: 3.6 10*3/uL — ABNORMAL LOW (ref 3.9–10.3)
lymph#: 1.1 10*3/uL (ref 0.9–3.3)

## 2017-02-02 MED ORDER — DENOSUMAB 120 MG/1.7ML ~~LOC~~ SOLN
120.0000 mg | Freq: Once | SUBCUTANEOUS | Status: AC
  Administered 2017-02-02: 120 mg via SUBCUTANEOUS
  Filled 2017-02-02: qty 1.7

## 2017-02-02 MED ORDER — FULVESTRANT 250 MG/5ML IM SOLN
500.0000 mg | Freq: Once | INTRAMUSCULAR | Status: AC
Start: 2017-02-02 — End: 2017-02-02
  Administered 2017-02-02: 500 mg via INTRAMUSCULAR
  Filled 2017-02-02: qty 10

## 2017-03-02 ENCOUNTER — Other Ambulatory Visit (HOSPITAL_BASED_OUTPATIENT_CLINIC_OR_DEPARTMENT_OTHER): Payer: Medicare Other

## 2017-03-02 ENCOUNTER — Ambulatory Visit (HOSPITAL_BASED_OUTPATIENT_CLINIC_OR_DEPARTMENT_OTHER): Payer: Medicare Other

## 2017-03-02 ENCOUNTER — Ambulatory Visit (HOSPITAL_BASED_OUTPATIENT_CLINIC_OR_DEPARTMENT_OTHER): Payer: Medicare Other | Admitting: Hematology and Oncology

## 2017-03-02 ENCOUNTER — Other Ambulatory Visit: Payer: Self-pay

## 2017-03-02 DIAGNOSIS — Z23 Encounter for immunization: Secondary | ICD-10-CM

## 2017-03-02 DIAGNOSIS — C7951 Secondary malignant neoplasm of bone: Secondary | ICD-10-CM

## 2017-03-02 DIAGNOSIS — C782 Secondary malignant neoplasm of pleura: Secondary | ICD-10-CM | POA: Diagnosis not present

## 2017-03-02 DIAGNOSIS — C787 Secondary malignant neoplasm of liver and intrahepatic bile duct: Secondary | ICD-10-CM | POA: Diagnosis not present

## 2017-03-02 DIAGNOSIS — R530 Neoplastic (malignant) related fatigue: Secondary | ICD-10-CM | POA: Diagnosis not present

## 2017-03-02 DIAGNOSIS — C50919 Malignant neoplasm of unspecified site of unspecified female breast: Secondary | ICD-10-CM

## 2017-03-02 DIAGNOSIS — C569 Malignant neoplasm of unspecified ovary: Secondary | ICD-10-CM

## 2017-03-02 LAB — COMPREHENSIVE METABOLIC PANEL
ALT: 18 U/L (ref 0–55)
AST: 21 U/L (ref 5–34)
Albumin: 4 g/dL (ref 3.5–5.0)
Alkaline Phosphatase: 52 U/L (ref 40–150)
Anion Gap: 13 mEq/L — ABNORMAL HIGH (ref 3–11)
BUN: 25 mg/dL (ref 7.0–26.0)
CO2: 24 mEq/L (ref 22–29)
Calcium: 9 mg/dL (ref 8.4–10.4)
Chloride: 103 mEq/L (ref 98–109)
Creatinine: 1 mg/dL (ref 0.6–1.1)
EGFR: 58 mL/min/{1.73_m2} — ABNORMAL LOW (ref >60)
Glucose: 359 mg/dl — ABNORMAL HIGH (ref 70–140)
Potassium: 4.6 mEq/L (ref 3.5–5.1)
Sodium: 140 mEq/L (ref 136–145)
Total Bilirubin: 0.93 mg/dL (ref 0.20–1.20)
Total Protein: 7 g/dL (ref 6.4–8.3)

## 2017-03-02 LAB — CBC WITH DIFFERENTIAL/PLATELET
BASO%: 0.2 % (ref 0.0–2.0)
Basophils Absolute: 0 10*3/uL (ref 0.0–0.1)
EOS%: 0 % (ref 0.0–7.0)
Eosinophils Absolute: 0 10*3/uL (ref 0.0–0.5)
HCT: 37.9 % (ref 34.8–46.6)
HGB: 12.9 g/dL (ref 11.6–15.9)
LYMPH%: 13.8 % — ABNORMAL LOW (ref 14.0–49.7)
MCH: 34.5 pg — ABNORMAL HIGH (ref 25.1–34.0)
MCHC: 34 g/dL (ref 31.5–36.0)
MCV: 101.3 fL — ABNORMAL HIGH (ref 79.5–101.0)
MONO#: 0.3 10*3/uL (ref 0.1–0.9)
MONO%: 4.1 % (ref 0.0–14.0)
NEUT#: 5 10*3/uL (ref 1.5–6.5)
NEUT%: 81.9 % — ABNORMAL HIGH (ref 38.4–76.8)
Platelets: 185 10*3/uL (ref 145–400)
RBC: 3.74 10*6/uL (ref 3.70–5.45)
RDW: 13.4 % (ref 11.2–14.5)
WBC: 6.1 10*3/uL (ref 3.9–10.3)
lymph#: 0.8 10*3/uL — ABNORMAL LOW (ref 0.9–3.3)

## 2017-03-02 MED ORDER — FULVESTRANT 250 MG/5ML IM SOLN
500.0000 mg | Freq: Once | INTRAMUSCULAR | Status: AC
  Administered 2017-03-02: 500 mg via INTRAMUSCULAR
  Filled 2017-03-02: qty 10

## 2017-03-02 MED ORDER — INFLUENZA VAC SPLIT HIGH-DOSE 0.5 ML IM SUSY
0.5000 mL | PREFILLED_SYRINGE | Freq: Once | INTRAMUSCULAR | Status: AC
  Administered 2017-03-02: 0.5 mL via INTRAMUSCULAR
  Filled 2017-03-02: qty 0.5

## 2017-03-02 NOTE — Patient Instructions (Addendum)
Fulvestrant injection What is this medicine? FULVESTRANT (ful VES trant) blocks the effects of estrogen. It is used to treat breast cancer. This medicine may be used for other purposes; ask your health care provider or pharmacist if you have questions. COMMON BRAND NAME(S): FASLODEX What should I tell my health care provider before I take this medicine? They need to know if you have any of these conditions: -bleeding problems -liver disease -low levels of platelets in the blood -an unusual or allergic reaction to fulvestrant, other medicines, foods, dyes, or preservatives -pregnant or trying to get pregnant -breast-feeding How should I use this medicine? This medicine is for injection into a muscle. It is usually given by a health care professional in a hospital or clinic setting. Talk to your pediatrician regarding the use of this medicine in children. Special care may be needed. Overdosage: If you think you have taken too much of this medicine contact a poison control center or emergency room at once. NOTE: This medicine is only for you. Do not share this medicine with others. What if I miss a dose? It is important not to miss your dose. Call your doctor or health care professional if you are unable to keep an appointment. What may interact with this medicine? -medicines that treat or prevent blood clots like warfarin, enoxaparin, and dalteparin This list may not describe all possible interactions. Give your health care provider a list of all the medicines, herbs, non-prescription drugs, or dietary supplements you use. Also tell them if you smoke, drink alcohol, or use illegal drugs. Some items may interact with your medicine. What should I watch for while using this medicine? Your condition will be monitored carefully while you are receiving this medicine. You will need important blood work done while you are taking this medicine. Do not become pregnant while taking this medicine or for  at least 1 year after stopping it. Women of child-bearing potential will need to have a negative pregnancy test before starting this medicine. Women should inform their doctor if they wish to become pregnant or think they might be pregnant. There is a potential for serious side effects to an unborn child. Men should inform their doctors if they wish to father a child. This medicine may lower sperm counts. Talk to your health care professional or pharmacist for more information. Do not breast-feed an infant while taking this medicine or for 1 year after the last dose. What side effects may I notice from receiving this medicine? Side effects that you should report to your doctor or health care professional as soon as possible: -allergic reactions like skin rash, itching or hives, swelling of the face, lips, or tongue -feeling faint or lightheaded, falls -pain, tingling, numbness, or weakness in the legs -signs and symptoms of infection like fever or chills; cough; flu-like symptoms; sore throat -vaginal bleeding Side effects that usually do not require medical attention (report to your doctor or health care professional if they continue or are bothersome): -aches, pains -constipation -diarrhea -headache -hot flashes -nausea, vomiting -pain at site where injected -stomach pain This list may not describe all possible side effects. Call your doctor for medical advice about side effects. You may report side effects to FDA at 1-800-FDA-1088. Where should I keep my medicine? This drug is given in a hospital or clinic and will not be stored at home. NOTE: This sheet is a summary. It may not cover all possible information. If you have questions about this medicine, talk to your   doctor, pharmacist, or health care provider.  2017 Elsevier/Gold Standard (2014-11-28 11:03:55) Influenza Virus Vaccine (Flucelvax) What is this medicine? INFLUENZA VIRUS VACCINE (in floo EN zuh VAHY ruhs vak SEEN) helps to  reduce the risk of getting influenza also known as the flu. The vaccine only helps protect you against some strains of the flu. This medicine may be used for other purposes; ask your health care provider or pharmacist if you have questions. COMMON BRAND NAME(S): FLUCELVAX What should I tell my health care provider before I take this medicine? They need to know if you have any of these conditions: -bleeding disorder like hemophilia -fever or infection -Guillain-Barre syndrome or other neurological problems -immune system problems -infection with the human immunodeficiency virus (HIV) or AIDS -low blood platelet counts -multiple sclerosis -an unusual or allergic reaction to influenza virus vaccine, other medicines, foods, dyes or preservatives -pregnant or trying to get pregnant -breast-feeding How should I use this medicine? This vaccine is for injection into a muscle. It is given by a health care professional. A copy of Vaccine Information Statements will be given before each vaccination. Read this sheet carefully each time. The sheet may change frequently. Talk to your pediatrician regarding the use of this medicine in children. Special care may be needed. Overdosage: If you think you've taken too much of this medicine contact a poison control center or emergency room at once. Overdosage: If you think you have taken too much of this medicine contact a poison control center or emergency room at once. NOTE: This medicine is only for you. Do not share this medicine with others. What if I miss a dose? This does not apply. What may interact with this medicine? -chemotherapy or radiation therapy -medicines that lower your immune system like etanercept, anakinra, infliximab, and adalimumab -medicines that treat or prevent blood clots like warfarin -phenytoin -steroid medicines like prednisone or cortisone -theophylline -vaccines This list may not describe all possible interactions. Give  your health care provider a list of all the medicines, herbs, non-prescription drugs, or dietary supplements you use. Also tell them if you smoke, drink alcohol, or use illegal drugs. Some items may interact with your medicine. What should I watch for while using this medicine? Report any side effects that do not go away within 3 days to your doctor or health care professional. Call your health care provider if any unusual symptoms occur within 6 weeks of receiving this vaccine. You may still catch the flu, but the illness is not usually as bad. You cannot get the flu from the vaccine. The vaccine will not protect against colds or other illnesses that may cause fever. The vaccine is needed every year. What side effects may I notice from receiving this medicine? Side effects that you should report to your doctor or health care professional as soon as possible: -allergic reactions like skin rash, itching or hives, swelling of the face, lips, or tongue Side effects that usually do not require medical attention (Report these to your doctor or health care professional if they continue or are bothersome.): -fever -headache -muscle aches and pains -pain, tenderness, redness, or swelling at the injection site -tiredness This list may not describe all possible side effects. Call your doctor for medical advice about side effects. You may report side effects to FDA at 1-800-FDA-1088. Where should I keep my medicine? The vaccine will be given by a health care professional in a clinic, pharmacy, doctor's office, or other health care setting. You will not be  given vaccine doses to store at home. NOTE: This sheet is a summary. It may not cover all possible information. If you have questions about this medicine, talk to your doctor, pharmacist, or health care provider.  2018 Elsevier/Gold Standard (2011-04-13 14:06:47)

## 2017-03-02 NOTE — Assessment & Plan Note (Signed)
Metastatic breast cancer: recently progressive in liver and bone, also pleura. OnFaslodex + Ibrance beginning 03/31/2016  Toxicities with therapy: 1. Neutropenia: Mild does not require dose adjustment. 2. injection site discomfort very mild 3. Fatigue related to Ibrance  CT chest abdomen pelvis for restaging 07/20/2016: Stable left pleural metastases, right liver lobe metastases unchanged, multiple bone metastases slightly progressive from previous  PET/CT scan 11/06/7626: Hypermetabolic on the left pleural space and hepatic metastases have improved. Osseous metastatic disease has progressed compared to prior PET/CT scan done 8 months ago but not at the CT scans done April 2018  Xgeva injections every 3 months. Obtain scans in 3 months and follow-up

## 2017-03-02 NOTE — Progress Notes (Signed)
Patient Care Team: [name], MD as PCP - General (Family Medicine)  DIAGNOSIS:  Encounter Diagnosis  Name Primary?  . 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 BRCA reportedly normal.      08/25/2015 Relapse/Recurrence    new solitary liver lesion, biopsy showing metastatic ER PR + breast      09/25/2015 - 03/11/2016 Chemotherapy    Xeloda. Stopped due to progression of liver and bone metastases; pleural metastases      03/31/2016 -  Anti-estrogen oral therapy    Faslodex plus Ibrance (along with Xgeva)       Breast cancer metastasized to liver, unspecified laterality ([place]) (Resolved)   03/27/2016 Initial Diagnosis    Breast cancer metastasized to liver, unspecified laterality ([place])      CHIEF COMPLIANT: follow-up of metastatic breast cancer on Faslodex with Ibrance along with Delton See  INTERVAL HISTORY: Brittany Porter is a 71 year old lady with metastatic breast cancer currently on Faslodex with [place] and Xgeva. She is tolerating the treatment extremely well.she has fatigue related to the treatment but otherwise doing quite well denies any bone pain or any discomfort. Denies any nausea vomiting.  REVIEW OF SYSTEMS:   Constitutional: Denies fevers, chills or abnormal weight loss Eyes: Denies blurriness of vision Ears, nose, mouth, throat, and face: Denies mucositis or sore  throat Respiratory: Denies cough, dyspnea or wheezes Cardiovascular: Denies palpitation, chest discomfort Gastrointestinal:  Denies nausea, heartburn or change in bowel habits Skin: Denies abnormal skin rashes Lymphatics: Denies new lymphadenopathy or easy bruising Neurological:Denies numbness, tingling or new weaknesses Behavioral/Psych: Mood is stable, no new changes  Extremities: No lower extremity edema All other systems were reviewed with the patient and are negative.  I have reviewed the past medical history, past surgical history, social history and family history with the patient and they are unchanged from previous note.  ALLERGIES:  is allergic to sulfa antibiotics.  MEDICATIONS:  Current Outpatient Prescriptions  Medication Sig Dispense Refill  . benzonatate (TESSALON) 100 MG capsule Take 1 capsule (100 mg total) by mouth every 8 (eight) hours as needed for cough. 30 capsule 0  . Calcium Carb-Cholecalciferol (CALCIUM 600+D3) 600-800 MG-UNIT TABS Take 1 tablet by mouth 2 (two) times daily.    . Cholecalciferol (VITAMIN D3) 2000 UNITS TABS Take 2,000 mg by mouth daily.    . ferrous sulfate 325 (65 FE) MG tablet Take 325 mg by mouth daily with breakfast.    . fish oil-omega-3 fatty acids 1000 MG capsule Take 1 g by mouth 2 (two) times daily.     . furosemide (LASIX) 40 MG tablet Take 40 mg by mouth daily as needed for fluid or edema. Reported on 05/07/2015    . glimepiride (AMARYL) 2 MG tablet Take 2 mg by mouth daily with breakfast.    . ibuprofen (ADVIL,MOTRIN) 200 MG tablet Take 400 mg by mouth every 6 (six) hours  as needed for headache (pain). Reported on 11/12/2015    . metFORMIN (GLUCOPHAGE) 500 MG tablet Take 500 mg by mouth 2 (two) times daily with a meal.      . nabumetone (RELAFEN) 750 MG tablet     . nystatin (MYCOSTATIN) powder Apply to right groin 2-3 times a day 30 g 0  . ondansetron (ZOFRAN) 8 MG tablet Take 1 tablet (8 mg total) by mouth every 8 (eight) hours as  needed for nausea or vomiting. (Patient not taking: Reported on 05/26/2016) 20 tablet 1  . palbociclib (IBRANCE) 125 MG capsule Take 1 capsule (125 mg total) by mouth daily with breakfast. Take whole with food. 21 capsule 3  . simvastatin (ZOCOR) 40 MG tablet Take 40 mg by mouth at bedtime.       No current facility-administered medications for this visit.     PHYSICAL EXAMINATION: ECOG PERFORMANCE STATUS: 1 - Symptomatic but completely ambulatory  Vitals:   03/02/17 1115  BP: (!) 144/66  Pulse: 85  Resp: 20  Temp: 97.9 F (36.6 C)  SpO2: 97%   Filed Weights   03/02/17 1115  Weight: 231 lb 9.6 oz (105.1 kg)    GENERAL:alert, no distress and comfortable SKIN: skin color, texture, turgor are normal, no rashes or significant lesions EYES: normal, Conjunctiva are pink and non-injected, sclera clear OROPHARYNX:no exudate, no erythema and lips, buccal mucosa, and tongue normal  NECK: supple, thyroid normal size, non-tender, without nodularity LYMPH:  no palpable lymphadenopathy in the cervical, axillary or inguinal LUNGS: clear to auscultation and percussion with normal breathing effort HEART: regular rate & rhythm and no murmurs and no lower extremity edema ABDOMEN:abdomen soft, non-tender and normal bowel sounds MUSCULOSKELETAL:no cyanosis of digits and no clubbing  NEURO: alert & oriented x 3 with fluent speech, no focal motor/sensory deficits EXTREMITIES: No lower extremity edema  LABORATORY DATA:  I have reviewed the data as listed   Chemistry      Component Value Date/Time   NA 143 02/02/2017 1111   K 5.0 02/02/2017 1111   CL 102 07/12/2012 0956   CO2 29 02/02/2017 1111   BUN 21.2 02/02/2017 1111   CREATININE 0.8 02/02/2017 1111      Component Value Date/Time   CALCIUM 9.3 02/02/2017 1111   ALKPHOS 43 02/02/2017 1111   AST 24 02/02/2017 1111   ALT 13 02/02/2017 1111   BILITOT 1.04 02/02/2017 1111       Lab Results  Component Value Date   WBC 3.6 (L)  02/02/2017   HGB 12.3 02/02/2017   HCT 36.6 02/02/2017   MCV 103.1 (H) 02/02/2017   PLT 154 02/02/2017   NEUTROABS 2.1 02/02/2017    ASSESSMENT & PLAN:  Breast cancer metastasized to liver [facility]) Metastatic breast cancer: recently progressive in liver and bone, also pleura. OnFaslodex + Ibrance beginning 03/31/2016  Toxicities with therapy: 1. Neutropenia: Mild does not require dose adjustment. 2. injection site discomfort very mild 3. Fatigue related to Ibrance  CT chest abdomen pelvis for restaging 07/20/2016: Stable left pleural metastases, right liver lobe metastases unchanged, multiple bone metastases slightly progressive from previous  PET/CT scan 12/75/1700: Hypermetabolic on the left pleural space and hepatic metastases have improved. Osseous metastatic disease has progressed compared to prior PET/CT scan done 8 months ago but not at the CT scans done April 2018  Xgeva injections every 3 months. Obtain scans in 2 months and follow-up with labs   I spent 25 minutes talking to the patient of which  more than half was spent in counseling and coordination of care.  No orders of the defined types were placed in this encounter.  The patient has a good understanding of the overall plan. she agrees with it. she will call with any problems that may develop before the next visit here.   Rulon Eisenmenger, MD 03/02/17

## 2017-03-07 ENCOUNTER — Telehealth: Payer: Self-pay | Admitting: *Deleted

## 2017-03-07 NOTE — Telephone Encounter (Signed)
"  Dr. Lindi Adie told me I need a PET scan scheduled for December.  A new girl gave me a paper to call and schedule the scan.  I've never had to schedule a PET scan before."  Shared Radiology Central Scheduling number for all radiology tests.  Patients may call at any time to schedule.  Authorization requested before performance date.  Apologized for any miscommunication as there are times staff call scheduling to inquire at patients request, receive appointment information, providing appointment information to patients.  Denies any further questions.

## 2017-03-13 ENCOUNTER — Other Ambulatory Visit: Payer: Self-pay | Admitting: Hematology and Oncology

## 2017-03-13 DIAGNOSIS — C50919 Malignant neoplasm of unspecified site of unspecified female breast: Secondary | ICD-10-CM

## 2017-03-27 ENCOUNTER — Telehealth: Payer: Self-pay | Admitting: Pharmacy Technician

## 2017-03-30 ENCOUNTER — Ambulatory Visit (HOSPITAL_BASED_OUTPATIENT_CLINIC_OR_DEPARTMENT_OTHER): Payer: Medicare Other

## 2017-03-30 DIAGNOSIS — C50919 Malignant neoplasm of unspecified site of unspecified female breast: Secondary | ICD-10-CM | POA: Diagnosis not present

## 2017-03-30 DIAGNOSIS — Z5111 Encounter for antineoplastic chemotherapy: Secondary | ICD-10-CM | POA: Diagnosis not present

## 2017-03-30 MED ORDER — FULVESTRANT 250 MG/5ML IM SOLN
500.0000 mg | Freq: Once | INTRAMUSCULAR | Status: AC
  Administered 2017-03-30: 500 mg via INTRAMUSCULAR
  Filled 2017-03-30: qty 10

## 2017-03-30 MED ORDER — DENOSUMAB 120 MG/1.7ML ~~LOC~~ SOLN: 120.0000 mg | Freq: Once | SUBCUTANEOUS | Status: DC

## 2017-03-30 NOTE — Patient Instructions (Signed)

## 2017-04-03 NOTE — Telephone Encounter (Signed)
Oral Oncology Patient Advocate Encounter  Met patient in [place] to complete a renewal application for [employer] in an effort to keep the patient's out of pocket expense for Ibrance at $0.    Application completed and faxed to [phone].   [employer] patient assistance phone number for follow up is [phone].   This encounter will be updated until final determination.  Fabio Asa. Melynda Keller, [place] Patient [place] [phone] 04/03/2017 3:48 PM

## 2017-04-27 ENCOUNTER — Ambulatory Visit: Payer: Medicare Other

## 2017-05-01 ENCOUNTER — Ambulatory Visit (HOSPITAL_COMMUNITY)
Admission: RE | Admit: 2017-05-01 | Discharge: 2017-05-01 | Disposition: A | Discharge status: Home / Self Care | Payer: Medicare Other | Source: Ambulatory Visit | Attending: Hematology and Oncology | Admitting: Hematology and Oncology

## 2017-05-01 DIAGNOSIS — R590 Localized enlarged lymph nodes: Secondary | ICD-10-CM | POA: Insufficient documentation

## 2017-05-01 DIAGNOSIS — C50919 Malignant neoplasm of unspecified site of unspecified female breast: Secondary | ICD-10-CM | POA: Diagnosis present

## 2017-05-01 DIAGNOSIS — C787 Secondary malignant neoplasm of liver and intrahepatic bile duct: Secondary | ICD-10-CM | POA: Diagnosis not present

## 2017-05-01 DIAGNOSIS — C7951 Secondary malignant neoplasm of bone: Secondary | ICD-10-CM | POA: Insufficient documentation

## 2017-05-01 LAB — GLUCOSE, CAPILLARY: Glucose-Capillary: 179 mg/dL — ABNORMAL HIGH (ref 65–99)

## 2017-05-01 MED ORDER — FLUDEOXYGLUCOSE F - 18 (FDG) INJECTION
9.7500 | Freq: Once | INTRAVENOUS | Status: AC | PRN
  Administered 2017-05-01: 9.75 via INTRAVENOUS

## 2017-05-01 NOTE — Assessment & Plan Note (Signed)
Metastatic breast cancer: recently progressive in liver and bone, also pleura. OnFaslodex + Ibrance beginning 03/31/2016  Toxicities with therapy: 1. Neutropenia: Mild does not require dose adjustment. 2. injection site discomfort very mild 3. Fatigue related to Ibrance  CT chest abdomen pelvis for restaging 07/20/2016: Stable left pleural metastases, right liver lobe metastases unchanged, multiple bone metastases slightly progressive from previous  PET/CT scan 05/01/17: Diffuse bone mets prog in T5, T10 and Lt Iliac crest, New Rt.Paratracheal LN Prog of Left CP sulcus, Prog of post liver mets  Xgeva injections every 3 months.  Plan:  Recommend change in treatment to Exemestane and Eveolimus Obtain MSI testing and BRCA mutation testing

## 2017-05-02 ENCOUNTER — Ambulatory Visit (HOSPITAL_COMMUNITY): Payer: Medicare Other

## 2017-05-02 ENCOUNTER — Other Ambulatory Visit (HOSPITAL_BASED_OUTPATIENT_CLINIC_OR_DEPARTMENT_OTHER): Payer: Medicare Other

## 2017-05-02 ENCOUNTER — Telehealth: Payer: Self-pay | Admitting: Hematology and Oncology

## 2017-05-02 ENCOUNTER — Ambulatory Visit (HOSPITAL_BASED_OUTPATIENT_CLINIC_OR_DEPARTMENT_OTHER): Payer: Medicare Other

## 2017-05-02 ENCOUNTER — Ambulatory Visit (HOSPITAL_BASED_OUTPATIENT_CLINIC_OR_DEPARTMENT_OTHER): Payer: Medicare Other | Admitting: Hematology and Oncology

## 2017-05-02 DIAGNOSIS — Z5111 Encounter for antineoplastic chemotherapy: Secondary | ICD-10-CM | POA: Diagnosis not present

## 2017-05-02 DIAGNOSIS — C7951 Secondary malignant neoplasm of bone: Secondary | ICD-10-CM | POA: Diagnosis not present

## 2017-05-02 DIAGNOSIS — C782 Secondary malignant neoplasm of pleura: Secondary | ICD-10-CM | POA: Diagnosis not present

## 2017-05-02 DIAGNOSIS — C50919 Malignant neoplasm of unspecified site of unspecified female breast: Secondary | ICD-10-CM

## 2017-05-02 DIAGNOSIS — C787 Secondary malignant neoplasm of liver and intrahepatic bile duct: Secondary | ICD-10-CM

## 2017-05-02 DIAGNOSIS — R530 Neoplastic (malignant) related fatigue: Secondary | ICD-10-CM | POA: Diagnosis not present

## 2017-05-02 LAB — CBC WITH DIFFERENTIAL/PLATELET
BASO%: 1.3 % (ref 0.0–2.0)
Basophils Absolute: 0 10*3/uL (ref 0.0–0.1)
EOS%: 1.5 % (ref 0.0–7.0)
Eosinophils Absolute: 0 10*3/uL (ref 0.0–0.5)
HCT: 35.2 % (ref 34.8–46.6)
HGB: 11.8 g/dL (ref 11.6–15.9)
LYMPH%: 28 % (ref 14.0–49.7)
MCH: 35 pg — ABNORMAL HIGH (ref 25.1–34.0)
MCHC: 33.7 g/dL (ref 31.5–36.0)
MCV: 103.9 fL — ABNORMAL HIGH (ref 79.5–101.0)
MONO#: 0.3 10*3/uL (ref 0.1–0.9)
MONO%: 9.5 % (ref 0.0–14.0)
NEUT#: 1.8 10*3/uL (ref 1.5–6.5)
NEUT%: 59.7 % (ref 38.4–76.8)
Platelets: 148 10*3/uL (ref 145–400)
RBC: 3.39 10*6/uL — ABNORMAL LOW (ref 3.70–5.45)
RDW: 15.1 % — ABNORMAL HIGH (ref 11.2–14.5)
WBC: 3 10*3/uL — ABNORMAL LOW (ref 3.9–10.3)
lymph#: 0.8 10*3/uL — ABNORMAL LOW (ref 0.9–3.3)

## 2017-05-02 LAB — COMPREHENSIVE METABOLIC PANEL
ALT: 13 U/L (ref 0–55)
AST: 23 U/L (ref 5–34)
Albumin: 3.6 g/dL (ref 3.5–5.0)
Alkaline Phosphatase: 46 U/L (ref 40–150)
Anion Gap: 9 mEq/L (ref 3–11)
BUN: 21.1 mg/dL (ref 7.0–26.0)
CO2: 30 mEq/L — ABNORMAL HIGH (ref 22–29)
Calcium: 8.8 mg/dL (ref 8.4–10.4)
Chloride: 103 mEq/L (ref 98–109)
Creatinine: 0.8 mg/dL (ref 0.6–1.1)
EGFR: >60 mL/min/{1.73_m2} (ref >60)
Glucose: 141 mg/dl — ABNORMAL HIGH (ref 70–140)
Potassium: 4.6 mEq/L (ref 3.5–5.1)
Sodium: 141 mEq/L (ref 136–145)
Total Bilirubin: 0.97 mg/dL (ref 0.20–1.20)
Total Protein: 6.6 g/dL (ref 6.4–8.3)

## 2017-05-02 MED ORDER — FULVESTRANT 250 MG/5ML IM SOLN
500.0000 mg | Freq: Once | INTRAMUSCULAR | Status: AC
Start: 2017-05-02 — End: 2017-05-02
  Administered 2017-05-02: 500 mg via INTRAMUSCULAR

## 2017-05-02 MED ORDER — DENOSUMAB 120 MG/1.7ML ~~LOC~~ SOLN
120.0000 mg | Freq: Once | SUBCUTANEOUS | Status: AC
  Administered 2017-05-02: 120 mg via SUBCUTANEOUS

## 2017-05-02 MED ORDER — LETROZOLE 2.5 MG PO TABS: 2.5000 mg | ORAL_TABLET | Freq: Every day | ORAL | 3 refills | Status: DC

## 2017-05-02 NOTE — Progress Notes (Signed)
Patient Care Team: [name], MD as PCP - General (Family Medicine)  DIAGNOSIS:  Encounter Diagnosis  Name Primary?  . 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)     SUMMARY OF ONCOLOGIC HISTORY:   Breast cancer metastasized to liver [facility])   1997 Initial Diagnosis    bilateral breast cancers 1997 treated with bilateral mastectomies and axillary node dissections, adjuvant chemotherapy and radiation.      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 BRCA reportedly normal.      08/25/2015 Relapse/Recurrence    new solitary liver lesion, biopsy showing metastatic ER PR + breast      09/25/2015 - 03/11/2016 Chemotherapy    Xeloda. Stopped due to progression of liver and bone metastases; pleural metastases      03/31/2016 -  Anti-estrogen oral therapy    Faslodex plus Ibrance (along with Xgeva)       Breast cancer metastasized to liver, unspecified laterality ([place]) (Resolved)   03/27/2016 Initial Diagnosis    Breast cancer metastasized to liver, unspecified laterality ([place])       CHIEF COMPLIANT: Follow-up on Faslodex, Brock Bad  INTERVAL HISTORY: RAYOLA EVERHART is a 71 year old with above-mentioned history of Faslodex with Ibrance along with Delton See who is here for a follow-up visit after undergoing a PET/CT scan.  She has been on Ibrance for the past 1 year.  She is tolerating the treatment fairly well.  She does have muscle cramps intermittently.  Denies any fevers or chills.  Denies any new onset back pain.  REVIEW OF SYSTEMS:   Constitutional: Denies fevers, chills or abnormal weight loss, complains of fatigue Eyes: Denies blurriness of vision Ears, nose, mouth,  throat, and face: Denies mucositis or sore throat Respiratory: Denies cough, dyspnea or wheezes Cardiovascular: Denies palpitation, chest discomfort Gastrointestinal:  Denies nausea, heartburn or change in bowel habits Skin: Denies abnormal skin rashes Lymphatics: Denies new lymphadenopathy or easy bruising Neurological:Denies numbness, tingling or new weaknesses Behavioral/Psych: Mood is stable, no new changes  Extremities: No lower extremity edema All other systems were reviewed with the patient and are negative.  I have reviewed the past medical history, past surgical history, social history and family history with the patient and they are unchanged from previous note.  ALLERGIES:  is allergic to sulfa antibiotics.  MEDICATIONS:  Current Outpatient Medications  Medication Sig Dispense Refill  . benzonatate (TESSALON) 100 MG capsule Take 1 capsule (100 mg total) by mouth every 8 (eight) hours as needed for cough. 30 capsule 0  . Calcium Carb-Cholecalciferol (CALCIUM 600+D3) 600-800 MG-UNIT TABS Take 1 tablet by mouth 2 (two) times daily.    . Cholecalciferol (VITAMIN D3) 2000 UNITS TABS Take 2,000 mg by mouth daily.    . ferrous sulfate 325 (65 FE) MG tablet Take 325 mg by mouth daily with breakfast.    . fish oil-omega-3 fatty acids 1000 MG capsule Take 1 g by mouth 2 (two) times daily.     . furosemide (LASIX) 40 MG tablet Take 40 mg by mouth daily as needed for fluid or edema. Reported on 05/07/2015    . glimepiride (AMARYL) 2 MG tablet Take 2 mg by mouth daily with breakfast.    .  ibuprofen (ADVIL,MOTRIN) 200 MG tablet Take 400 mg by mouth every 6 (six) hours as needed for headache (pain). Reported on 11/12/2015    . letrozole (FEMARA) 2.5 MG tablet Take 1 tablet (2.5 mg total) by mouth daily. 90 tablet 3  . metFORMIN (GLUCOPHAGE) 500 MG tablet Take 500 mg by mouth 2 (two) times daily with a meal.      . nabumetone (RELAFEN) 750 MG tablet     . nystatin (MYCOSTATIN) powder Apply to  right groin 2-3 times a day 30 g 0  . ondansetron (ZOFRAN) 8 MG tablet Take 1 tablet (8 mg total) by mouth every 8 (eight) hours as needed for nausea or vomiting. (Patient not taking: Reported on 05/26/2016) 20 tablet 1  . palbociclib (IBRANCE) 125 MG capsule Take 1 capsule (125 mg total) by mouth daily with breakfast. Take whole with food. 21 capsule 3  . simvastatin (ZOCOR) 40 MG tablet Take 40 mg by mouth at bedtime.       No current facility-administered medications for this visit.     PHYSICAL EXAMINATION: ECOG PERFORMANCE STATUS: 1 - Symptomatic but completely ambulatory  Vitals:   05/02/17 1128  BP: (!) 151/79  Pulse: (!) 108  Resp: 18  Temp: 97.8 F (36.6 C)  SpO2: 100%   Filed Weights   05/02/17 1128  Weight: 228 lb 1.6 oz (103.5 kg)    GENERAL:alert, no distress and comfortable SKIN: skin color, texture, turgor are normal, no rashes or significant lesions EYES: normal, Conjunctiva are pink and non-injected, sclera clear OROPHARYNX:no exudate, no erythema and lips, buccal mucosa, and tongue normal  NECK: supple, thyroid normal size, non-tender, without nodularity LYMPH:  no palpable lymphadenopathy in the cervical, axillary or inguinal LUNGS: clear to auscultation and percussion with normal breathing effort HEART: regular rate & rhythm and no murmurs and no lower extremity edema ABDOMEN:abdomen soft, non-tender and normal bowel sounds MUSCULOSKELETAL:no cyanosis of digits and no clubbing  NEURO: alert & oriented x 3 with fluent speech, no focal motor/sensory deficits EXTREMITIES: No lower extremity edema  LABORATORY DATA:  I have reviewed the data as listed   Chemistry      Component Value Date/Time   NA 141 05/02/2017 1047   K 4.6 05/02/2017 1047   CL 102 07/12/2012 0956   CO2 30 (H) 05/02/2017 1047   BUN 21.1 05/02/2017 1047   CREATININE 0.8 05/02/2017 1047      Component Value Date/Time   CALCIUM 8.8 05/02/2017 1047   ALKPHOS 46 05/02/2017 1047   AST 23  05/02/2017 1047   ALT 13 05/02/2017 1047   BILITOT 0.97 05/02/2017 1047       Lab Results  Component Value Date   WBC 3.0 (L) 05/02/2017   HGB 11.8 05/02/2017   HCT 35.2 05/02/2017   MCV 103.9 (H) 05/02/2017   PLT 148 05/02/2017   NEUTROABS 1.8 05/02/2017    ASSESSMENT & PLAN:  Breast cancer metastasized to liver [facility]) Metastatic breast cancer: recently progressive in liver and bone, also pleura. OnFaslodex + Ibrance beginning 03/31/2016  Toxicities with therapy: 1. Neutropenia: Mild does not require dose adjustment. 2. injection site discomfort very mild 3. Fatigue related to Ibrance  CT chest abdomen pelvis for restaging 07/20/2016: Stable left pleural metastases, right liver lobe metastases unchanged, multiple bone metastases slightly progressive from previous  PET/CT scan 05/01/17: Diffuse bone mets prog in T5, T10 and Lt Iliac crest, New Rt.Paratracheal LN Prog of Left CP sulcus, Prog of post liver mets I reviewed  the PET/CT scan extensively the degree of progression is very slight.  I did not believe it is significant enough to change therapy.  However we discussed and agreed to start her on letrozole in addition to Faslodex and Ibrance.  Xgeva injections every 3 months.  Plan:  Recommend adding letrozole to Faslodex and Ibrance Obtain MSI testing and BRCA mutation testing (when she returns back to see me in 3 months)   I spent 25 minutes talking to the patient of which more than half was spent in counseling and coordination of care.  No orders of the defined types were placed in this encounter.  The patient has a good understanding of the overall plan. she agrees with it. she will call with any problems that may develop before the next visit here.   Rulon Eisenmenger, MD 05/02/17

## 2017-05-02 NOTE — Patient Instructions (Signed)
Fulvestrant injection What is this medicine? FULVESTRANT (ful VES trant) blocks the effects of estrogen. It is used to treat breast cancer. This medicine may be used for other purposes; ask your health care provider or pharmacist if you have questions. COMMON BRAND NAME(S): FASLODEX What should I tell my health care provider before I take this medicine? They need to know if you have any of these conditions: -bleeding problems -liver disease -low levels of platelets in the blood -an unusual or allergic reaction to fulvestrant, other medicines, foods, dyes, or preservatives -pregnant or trying to get pregnant -breast-feeding How should I use this medicine? This medicine is for injection into a muscle. It is usually given by a health care professional in a hospital or clinic setting. Talk to your pediatrician regarding the use of this medicine in children. Special care may be needed. Overdosage: If you think you have taken too much of this medicine contact a poison control center or emergency room at once. NOTE: This medicine is only for you. Do not share this medicine with others. What if I miss a dose? It is important not to miss your dose. Call your doctor or health care professional if you are unable to keep an appointment. What may interact with this medicine? -medicines that treat or prevent blood clots like warfarin, enoxaparin, and dalteparin This list may not describe all possible interactions. Give your health care provider a list of all the medicines, herbs, non-prescription drugs, or dietary supplements you use. Also tell them if you smoke, drink alcohol, or use illegal drugs. Some items may interact with your medicine. What should I watch for while using this medicine? Your condition will be monitored carefully while you are receiving this medicine. You will need important blood work done while you are taking this medicine. Do not become pregnant while taking this medicine or for  at least 1 year after stopping it. Women of child-bearing potential will need to have a negative pregnancy test before starting this medicine. Women should inform their doctor if they wish to become pregnant or think they might be pregnant. There is a potential for serious side effects to an unborn child. Men should inform their doctors if they wish to father a child. This medicine may lower sperm counts. Talk to your health care professional or pharmacist for more information. Do not breast-feed an infant while taking this medicine or for 1 year after the last dose. What side effects may I notice from receiving this medicine? Side effects that you should report to your doctor or health care professional as soon as possible: -allergic reactions like skin rash, itching or hives, swelling of the face, lips, or tongue -feeling faint or lightheaded, falls -pain, tingling, numbness, or weakness in the legs -signs and symptoms of infection like fever or chills; cough; flu-like symptoms; sore throat -vaginal bleeding Side effects that usually do not require medical attention (report to your doctor or health care professional if they continue or are bothersome): -aches, pains -constipation -diarrhea -headache -hot flashes -nausea, vomiting -pain at site where injected -stomach pain This list may not describe all possible side effects. Call your doctor for medical advice about side effects. You may report side effects to FDA at 1-800-FDA-1088. Where should I keep my medicine? This drug is given in a hospital or clinic and will not be stored at home. NOTE: This sheet is a summary. It may not cover all possible information. If you have questions about this medicine, talk to your   doctor, pharmacist, or health care provider.  2017 Elsevier/Gold Standard (2014-11-28 11:03:55) Influenza Virus Vaccine (Flucelvax) What is this medicine? INFLUENZA VIRUS VACCINE (in floo EN zuh VAHY ruhs vak SEEN) helps to  reduce the risk of getting influenza also known as the flu. The vaccine only helps protect you against some strains of the flu. This medicine may be used for other purposes; ask your health care provider or pharmacist if you have questions. COMMON BRAND NAME(S): FLUCELVAX What should I tell my health care provider before I take this medicine? They need to know if you have any of these conditions: -bleeding disorder like hemophilia -fever or infection -Guillain-Barre syndrome or other neurological problems -immune system problems -infection with the human immunodeficiency virus (HIV) or AIDS -low blood platelet counts -multiple sclerosis -an unusual or allergic reaction to influenza virus vaccine, other medicines, foods, dyes or preservatives -pregnant or trying to get pregnant -breast-feeding How should I use this medicine? This vaccine is for injection into a muscle. It is given by a health care professional. A copy of Vaccine Information Statements will be given before each vaccination. Read this sheet carefully each time. The sheet may change frequently. Talk to your pediatrician regarding the use of this medicine in children. Special care may be needed. Overdosage: If you think you've taken too much of this medicine contact a poison control center or emergency room at once. Overdosage: If you think you have taken too much of this medicine contact a poison control center or emergency room at once. NOTE: This medicine is only for you. Do not share this medicine with others. What if I miss a dose? This does not apply. What may interact with this medicine? -chemotherapy or radiation therapy -medicines that lower your immune system like etanercept, anakinra, infliximab, and adalimumab -medicines that treat or prevent blood clots like warfarin -phenytoin -steroid medicines like prednisone or cortisone -theophylline -vaccines This list may not describe all possible interactions. Give  your health care provider a list of all the medicines, herbs, non-prescription drugs, or dietary supplements you use. Also tell them if you smoke, drink alcohol, or use illegal drugs. Some items may interact with your medicine. What should I watch for while using this medicine? Report any side effects that do not go away within 3 days to your doctor or health care professional. Call your health care provider if any unusual symptoms occur within 6 weeks of receiving this vaccine. You may still catch the flu, but the illness is not usually as bad. You cannot get the flu from the vaccine. The vaccine will not protect against colds or other illnesses that may cause fever. The vaccine is needed every year. What side effects may I notice from receiving this medicine? Side effects that you should report to your doctor or health care professional as soon as possible: -allergic reactions like skin rash, itching or hives, swelling of the face, lips, or tongue Side effects that usually do not require medical attention (Report these to your doctor or health care professional if they continue or are bothersome.): -fever -headache -muscle aches and pains -pain, tenderness, redness, or swelling at the injection site -tiredness This list may not describe all possible side effects. Call your doctor for medical advice about side effects. You may report side effects to FDA at 1-800-FDA-1088. Where should I keep my medicine? The vaccine will be given by a health care professional in a clinic, pharmacy, doctor's office, or other health care setting. You will not be  given vaccine doses to store at home. NOTE: This sheet is a summary. It may not cover all possible information. If you have questions about this medicine, talk to your doctor, pharmacist, or health care provider.  2018 Elsevier/Gold Standard (2011-04-13 14:06:47)

## 2017-05-02 NOTE — Telephone Encounter (Signed)
Gave patient avs and calendar with appts per 12/18 los.

## 2017-05-03 ENCOUNTER — Ambulatory Visit: Payer: Medicare Other

## 2017-05-03 ENCOUNTER — Ambulatory Visit: Payer: Medicare Other | Admitting: Hematology and Oncology

## 2017-05-03 ENCOUNTER — Other Ambulatory Visit: Payer: Medicare Other

## 2017-05-26 ENCOUNTER — Other Ambulatory Visit: Payer: Self-pay | Admitting: Pharmacist

## 2017-05-26 DIAGNOSIS — C50919 Malignant neoplasm of unspecified site of unspecified female breast: Secondary | ICD-10-CM

## 2017-05-26 MED ORDER — PALBOCICLIB 125 MG PO CAPS: 125.0000 mg | ORAL_CAPSULE | Freq: Every day | ORAL | 3 refills | Status: DC

## 2017-05-30 ENCOUNTER — Telehealth: Payer: Self-pay | Admitting: Pharmacy Technician

## 2017-05-30 NOTE — Telephone Encounter (Signed)
Received communication from [employer] that since copay assistance grants are available, enrollment will not be granted at this time.    Patient has been conditionally enrolled with [employer] for copay assistance.  Her prescriptions for Leslee Home will now be filled at the [facility]  This application has been suspended with [place] and filed away in the event that it is needed later in the year.   Fabio Asa. Melynda Keller, [place] Patient [place] [phone] 05/30/2017 2:25 PM

## 2017-05-30 NOTE — Telephone Encounter (Signed)
Oral Oncology Patient Advocate Encounter  Patient has been conditionally approved for copay assistance with [employer] (TAF).    Current enrollment expires on 06/25/2017.  The patient will receive an enrollment application in the mail to complete and return to [employer].  This application's submission is required for the patient's continued enrollment after 06/25/2017.  I have discussed this with the patient.   When fully enrolled, The Assistance Fund will cover all copayment expenses for Ibrance for the remainder of the calendar year.    The billing information is as follows and has been shared with [place].  Member ID: [id number] Group ID: [id number] PCN: [id number] BIN: [id number]  I will continue to follow up for full enrollment status.   Fabio Asa. Melynda Keller, [place] Patient [place] [phone] 05/30/2017 2:26 PM

## 2017-06-08 ENCOUNTER — Telehealth: Payer: Self-pay | Admitting: Pharmacist

## 2017-06-08 ENCOUNTER — Inpatient Hospital Stay: Payer: Medicare Other | Attending: Hematology and Oncology

## 2017-06-08 VITALS — BP 139/78 | HR 100 | Temp 98.2°F | Resp 18

## 2017-06-08 DIAGNOSIS — C50919 Malignant neoplasm of unspecified site of unspecified female breast: Secondary | ICD-10-CM

## 2017-06-08 DIAGNOSIS — C7951 Secondary malignant neoplasm of bone: Secondary | ICD-10-CM | POA: Diagnosis not present

## 2017-06-08 DIAGNOSIS — Z5111 Encounter for antineoplastic chemotherapy: Secondary | ICD-10-CM | POA: Diagnosis present

## 2017-06-08 MED ORDER — FULVESTRANT 250 MG/5ML IM SOLN
INTRAMUSCULAR | Status: AC
  Filled 2017-06-08: qty 5

## 2017-06-08 MED ORDER — FULVESTRANT 250 MG/5ML IM SOLN
500.0000 mg | Freq: Once | INTRAMUSCULAR | Status: AC
  Administered 2017-06-08: 500 mg via INTRAMUSCULAR

## 2017-06-08 MED FILL — IBRANCE 125 MG CAPSULE: 125 | 21 days supply | Qty: 21 | Fill #0

## 2017-06-08 NOTE — Telephone Encounter (Signed)
Oral Chemotherapy Pharmacist Encounter  Follow-Up Form  Spoke with patient today in [facility] lobby to follow up regarding patient's oral chemotherapy medication: Ibrance (palbociclib) for the treatment of hormone receptor positive, metastatic breast cancer, in conjunction with Femara and Faslodex, planned duration until disease progression or unacceptable toxicity.  Original Start date of oral chemotherapy: 04/12/16  Pt is doing well today  Pt reports 0 doses of Ibrance 125mg  capsules, 1 capsule by mouth once daily for 21 days on, 7 days off, repeated every 28 days, missed in the last month.   Pt reports the following side effects: mild fatigue and intermittent muscle cramps, manageable  Pertinent labs reviewed: OK for continued treatment.  Other Issues: Patient had been receiving Ibrance from [employer] assistance, now with copayment grant to fill Ibrance at the [facility].  Patient knows to call the office with questions or concerns. Oral Oncology Clinic will continue to follow.  Thank you,  [name], PharmD, BCPS, BCOP 06/08/2017 12:03 PM Oral Oncology Clinic [phone]

## 2017-06-28 NOTE — Telephone Encounter (Signed)
Oral Oncology Patient Advocate Encounter  Confirmed with [employer] that the patient has been fully enrolled in their copay assistance program until 05/15/2018.  The [place] will cover all of the patient's copay expenses forIbrance until this date.    Fabio Asa. Melynda Keller, [place] Patient [place] [phone] 06/28/2017 4:50 PM

## 2017-06-29 MED FILL — IBRANCE 125 MG CAPSULE: 125 | 21 days supply | Qty: 21 | Fill #1

## 2017-06-30 ENCOUNTER — Other Ambulatory Visit: Payer: Self-pay | Admitting: Family Medicine

## 2017-06-30 ENCOUNTER — Ambulatory Visit
Admission: RE | Admit: 2017-06-30 | Discharge: 2017-06-30 | Disposition: A | Discharge status: Home / Self Care | Payer: Medicare Other | Source: Ambulatory Visit | Attending: Family Medicine | Admitting: Family Medicine

## 2017-06-30 DIAGNOSIS — J209 Acute bronchitis, unspecified: Secondary | ICD-10-CM

## 2017-07-13 ENCOUNTER — Inpatient Hospital Stay: Payer: Medicare Other | Attending: Hematology and Oncology

## 2017-07-13 ENCOUNTER — Other Ambulatory Visit: Payer: Self-pay

## 2017-07-13 DIAGNOSIS — C50919 Malignant neoplasm of unspecified site of unspecified female breast: Secondary | ICD-10-CM

## 2017-07-13 DIAGNOSIS — Z5111 Encounter for antineoplastic chemotherapy: Secondary | ICD-10-CM | POA: Diagnosis not present

## 2017-07-13 DIAGNOSIS — C782 Secondary malignant neoplasm of pleura: Principal | ICD-10-CM

## 2017-07-13 DIAGNOSIS — C787 Secondary malignant neoplasm of liver and intrahepatic bile duct: Principal | ICD-10-CM

## 2017-07-13 DIAGNOSIS — C7951 Secondary malignant neoplasm of bone: Secondary | ICD-10-CM | POA: Diagnosis not present

## 2017-07-13 MED ORDER — FULVESTRANT 250 MG/5ML IM SOLN
500.0000 mg | Freq: Once | INTRAMUSCULAR | Status: AC
  Administered 2017-07-13: 500 mg via INTRAMUSCULAR

## 2017-07-13 MED ORDER — FULVESTRANT 250 MG/5ML IM SOLN: 500.0000 mg | Freq: Once | INTRAMUSCULAR | Status: DC

## 2017-07-13 MED ORDER — FULVESTRANT 250 MG/5ML IM SOLN: 500.0000 mg | INTRAMUSCULAR | Status: DC

## 2017-07-13 MED ORDER — FULVESTRANT 250 MG/5ML IM SOLN
INTRAMUSCULAR | Status: AC
  Filled 2017-07-13: qty 5

## 2017-08-03 ENCOUNTER — Inpatient Hospital Stay: Payer: Medicare Other | Attending: Hematology and Oncology | Admitting: Hematology and Oncology

## 2017-08-03 ENCOUNTER — Inpatient Hospital Stay: Payer: Medicare Other

## 2017-08-03 ENCOUNTER — Telehealth: Payer: Self-pay | Admitting: Hematology and Oncology

## 2017-08-03 ENCOUNTER — Other Ambulatory Visit: Payer: Self-pay | Admitting: Hematology and Oncology

## 2017-08-03 DIAGNOSIS — C787 Secondary malignant neoplasm of liver and intrahepatic bile duct: Principal | ICD-10-CM

## 2017-08-03 DIAGNOSIS — C50919 Malignant neoplasm of unspecified site of unspecified female breast: Secondary | ICD-10-CM

## 2017-08-03 DIAGNOSIS — C7951 Secondary malignant neoplasm of bone: Secondary | ICD-10-CM | POA: Insufficient documentation

## 2017-08-03 DIAGNOSIS — Z5111 Encounter for antineoplastic chemotherapy: Secondary | ICD-10-CM | POA: Diagnosis not present

## 2017-08-03 DIAGNOSIS — C782 Secondary malignant neoplasm of pleura: Secondary | ICD-10-CM

## 2017-08-03 LAB — CBC WITH DIFFERENTIAL/PLATELET
Basophils Absolute: 0.1 10*3/uL (ref 0.0–0.1)
Basophils Relative: 2 %
Eosinophils Absolute: 0.1 10*3/uL (ref 0.0–0.5)
Eosinophils Relative: 2 %
HCT: 35.9 % (ref 34.8–46.6)
Hemoglobin: 11.9 g/dL (ref 11.6–15.9)
Lymphocytes Relative: 31 %
Lymphs Abs: 1 10*3/uL (ref 0.9–3.3)
MCH: 34.5 pg — ABNORMAL HIGH (ref 25.1–34.0)
MCHC: 33.1 g/dL (ref 31.5–36.0)
MCV: 104.1 fL — ABNORMAL HIGH (ref 79.5–101.0)
Monocytes Absolute: 0.5 10*3/uL (ref 0.1–0.9)
Monocytes Relative: 15 %
Neutro Abs: 1.7 10*3/uL (ref 1.5–6.5)
Neutrophils Relative %: 50 %
Platelets: 156 10*3/uL (ref 145–400)
RBC: 3.45 MIL/uL — ABNORMAL LOW (ref 3.70–5.45)
RDW: 15.1 % — ABNORMAL HIGH (ref 11.2–14.5)
WBC: 3.3 10*3/uL — ABNORMAL LOW (ref 3.9–10.3)

## 2017-08-03 LAB — COMPREHENSIVE METABOLIC PANEL
ALT: 13 U/L (ref 0–55)
AST: 23 U/L (ref 5–34)
Albumin: 3.4 g/dL — ABNORMAL LOW (ref 3.5–5.0)
Alkaline Phosphatase: 45 U/L (ref 40–150)
Anion gap: 10 (ref 3–11)
BUN: 15 mg/dL (ref 7–26)
CO2: 27 mmol/L (ref 22–29)
Calcium: 8.8 mg/dL (ref 8.4–10.4)
Chloride: 106 mmol/L (ref 98–109)
Creatinine, Ser: 0.78 mg/dL (ref 0.60–1.10)
GFR calc Af Amer: >60 mL/min (ref >60)
GFR calc non Af Amer: >60 mL/min (ref >60)
Glucose, Bld: 201 mg/dL — ABNORMAL HIGH (ref 70–140)
Potassium: 4.6 mmol/L (ref 3.5–5.1)
Sodium: 143 mmol/L (ref 136–145)
Total Bilirubin: 0.9 mg/dL (ref 0.2–1.2)
Total Protein: 6.3 g/dL — ABNORMAL LOW (ref 6.4–8.3)

## 2017-08-03 MED ORDER — DENOSUMAB 120 MG/1.7ML ~~LOC~~ SOLN
SUBCUTANEOUS | Status: AC
  Filled 2017-08-03: qty 1.7

## 2017-08-03 MED ORDER — FULVESTRANT 250 MG/5ML IM SOLN
INTRAMUSCULAR | Status: AC
  Filled 2017-08-03: qty 10

## 2017-08-03 MED ORDER — DENOSUMAB 120 MG/1.7ML ~~LOC~~ SOLN
120.0000 mg | Freq: Once | SUBCUTANEOUS | Status: AC
  Administered 2017-08-03: 120 mg via SUBCUTANEOUS

## 2017-08-03 MED ORDER — FULVESTRANT 250 MG/5ML IM SOLN
500.0000 mg | Freq: Once | INTRAMUSCULAR | Status: AC
  Administered 2017-08-03: 500 mg via INTRAMUSCULAR

## 2017-08-03 MED FILL — IBRANCE 125 MG CAPSULE: 125 | 21 days supply | Qty: 21 | Fill #2

## 2017-08-03 NOTE — Progress Notes (Signed)
Patient Care Team: [name], MD as PCP - General (Family Medicine)  DIAGNOSIS:  Encounter Diagnosis  Name Primary?  . 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 BRCA reportedly normal.      08/25/2015 Relapse/Recurrence    new solitary liver lesion, biopsy showing metastatic ER PR + breast      09/25/2015 - 03/11/2016 Chemotherapy    Xeloda. Stopped due to progression of liver and bone metastases; pleural metastases      03/31/2016 -  Anti-estrogen oral therapy    Faslodex plus Ibrance (along with Xgeva)       Carcinoma of breast metastatic to bone ([place])   03/27/2016 Initial Diagnosis    Carcinoma of breast metastatic to bone ([place])      08/03/2017 -  Chemotherapy    The patient had fulvestrant (FASLODEX) injection 500 mg, 500 mg, Intramuscular,  Once, 0 of 7 cycles  for chemotherapy treatment.        Breast cancer metastasized to liver, unspecified laterality ([place]) (Resolved)   03/27/2016 Initial Diagnosis    Breast cancer metastasized to liver, unspecified laterality ([place])       CHIEF COMPLIANT: Follow-up of metastatic breast cancer  INTERVAL HISTORY: Brittany Porter is a 72 year old with above-mentioned some metastatic breast cancer with liver and bone metastases who is currently on Faslodex along with Ibrance along with Xgeva.  She appears to be tolerating the treatment  fairly well.  She has no side effects from [place].  Denies any nausea vomiting or fatigue.  She had a recent upper respiratory infection but that has improved.  REVIEW OF SYSTEMS:   Constitutional: Denies fevers, chills or abnormal weight loss Eyes: Denies blurriness of vision Ears, nose, mouth, throat, and face: Denies mucositis or sore throat Respiratory: Denies cough, dyspnea or wheezes Cardiovascular: Denies palpitation, chest discomfort Gastrointestinal:  Denies nausea, heartburn or change in bowel habits Skin: Denies abnormal skin rashes Lymphatics: Denies new lymphadenopathy or easy bruising Neurological:Denies numbness, tingling or new weaknesses Behavioral/Psych: Mood is stable, no new changes  Extremities: No lower extremity edema Breast:  denies any pain or lumps or nodules in either breasts All other systems were reviewed with the patient and are negative.  I have reviewed the past medical history, past surgical history, social history and family history with the patient and they are unchanged from previous note.  ALLERGIES:  is allergic to sulfa antibiotics.  MEDICATIONS:  Current Outpatient Medications  Medication Sig Dispense Refill  . benzonatate (TESSALON) 100 MG capsule Take 1 capsule (100 mg total) by mouth every 8 (eight) hours as needed for cough. 30 capsule 0  . Calcium Carb-Cholecalciferol (CALCIUM 600+D3) 600-800 MG-UNIT TABS Take 1 tablet by mouth 2 (two) times daily.    . Cholecalciferol (VITAMIN D3) 2000 UNITS TABS Take 2,000 mg by mouth daily.    . ferrous sulfate 325 (65 FE) MG tablet Take 325  mg by mouth daily with breakfast.    . fish oil-omega-3 fatty acids 1000 MG capsule Take 1 g by mouth 2 (two) times daily.     . furosemide (LASIX) 40 MG tablet Take 40 mg by mouth daily as needed for fluid or edema. Reported on 05/07/2015    . glimepiride (AMARYL) 2 MG tablet Take 2 mg by mouth daily with breakfast.    . ibuprofen (ADVIL,MOTRIN) 200 MG tablet Take  400 mg by mouth every 6 (six) hours as needed for headache (pain). Reported on 11/12/2015    . letrozole (FEMARA) 2.5 MG tablet Take 1 tablet (2.5 mg total) by mouth daily. 90 tablet 3  . metFORMIN (GLUCOPHAGE) 500 MG tablet Take 500 mg by mouth 2 (two) times daily with a meal.      . nabumetone (RELAFEN) 750 MG tablet     . nystatin (MYCOSTATIN) powder Apply to right groin 2-3 times a day 30 g 0  . ondansetron (ZOFRAN) 8 MG tablet Take 1 tablet (8 mg total) by mouth every 8 (eight) hours as needed for nausea or vomiting. (Patient not taking: Reported on 05/26/2016) 20 tablet 1  . palbociclib (IBRANCE) 125 MG capsule Take 1 capsule (125 mg total) by mouth daily with breakfast. Take for 21 days on, 7 days off. Take whole with food. 21 capsule 3  . simvastatin (ZOCOR) 40 MG tablet Take 40 mg by mouth at bedtime.       No current facility-administered medications for this visit.     PHYSICAL EXAMINATION: ECOG PERFORMANCE STATUS: 1 - Symptomatic but completely ambulatory  Vitals:   08/03/17 1157  BP: (!) 147/88  Pulse: 85  Resp: 18  Temp: 98.8 F (37.1 C)  SpO2: 97%   Filed Weights   08/03/17 1157  Weight: 225 lb 14.4 oz (102.5 kg)    GENERAL:alert, no distress and comfortable SKIN: skin color, texture, turgor are normal, no rashes or significant lesions EYES: normal, Conjunctiva are pink and non-injected, sclera clear OROPHARYNX:no exudate, no erythema and lips, buccal mucosa, and tongue normal  NECK: supple, thyroid normal size, non-tender, without nodularity LYMPH:  no palpable lymphadenopathy in the cervical, axillary or inguinal LUNGS: clear to auscultation and percussion with normal breathing effort HEART: regular rate & rhythm and no murmurs and no lower extremity edema ABDOMEN:abdomen soft, non-tender and normal bowel sounds MUSCULOSKELETAL:no cyanosis of digits and no clubbing  NEURO: alert & oriented x 3 with fluent speech, no focal motor/sensory deficits EXTREMITIES: No  lower extremity edema  LABORATORY DATA:  I have reviewed the data as listed CMP Latest Ref Rng & Units 08/03/2017 05/02/2017 03/02/2017  Glucose 70 - 140 mg/dL 201(H) 141(H) 359(H)  BUN 7 - 26 mg/dL 15 21.1 25.0  Creatinine 0.60 - 1.10 mg/dL 0.78 0.8 1.0  Sodium 136 - 145 mmol/L 143 141 140  Potassium 3.5 - 5.1 mmol/L 4.6 4.6 4.6  Chloride 98 - 109 mmol/L 106 - -  CO2 22 - 29 mmol/L 27 30(H) 24  Calcium 8.4 - 10.4 mg/dL 8.8 8.8 9.0  Total Protein 6.4 - 8.3 g/dL 6.3(L) 6.6 7.0  Total Bilirubin 0.2 - 1.2 mg/dL 0.9 0.97 0.93  Alkaline Phos 40 - 150 U/L 45 46 52  AST 5 - 34 U/L '23 23 21  ' ALT 0 - 55 U/L '13 13 18    ' Lab Results  Component Value Date   WBC 3.3 (L) 08/03/2017   HGB 11.9 08/03/2017   HCT 35.9 08/03/2017   MCV  104.1 (H) 08/03/2017   PLT 156 08/03/2017   NEUTROABS 1.7 08/03/2017    ASSESSMENT & PLAN:  Breast cancer metastasized to liver [facility]) Metastatic breast cancer: recently progressive in liver and bone, also pleura. OnFaslodex + Ibrance beginning 03/31/2016  Toxicities with therapy: 1. Neutropenia: Mild does not require dose adjustment. 2. injection site discomfort very mild 3. Fatigue related to Ibrance  CT chest abdomen pelvis for restaging 07/20/2016: Stable left pleural metastases, right liver lobe metastases unchanged, multiple bone metastases slightly progressive from previous  Xgeva injections every 3 months.  Current treatment:  letrozole + Faslodex and Ibrance Obtain MSI testing and BRCA mutation testing, also PD1 testing on the liver biopsy from 2017  PET scan has been ordered for follow-up in 3 months. Return to clinic in 3 months for Xgeva and follow-up after scans  I spent 25 minutes talking to the patient of which more than half was spent in counseling and coordination of care.  No orders of the defined types were placed in this encounter.  The patient has a good understanding of the overall plan. she agrees with it. she will call with  any problems that may develop before the next visit here.   Harriette Ohara, MD 08/03/17

## 2017-08-03 NOTE — Telephone Encounter (Signed)
Gave patient AVs and calendar of upcoming march through June appointments.

## 2017-08-03 NOTE — Progress Notes (Signed)
Patient Care Team: [name], MD as PCP - General (Family Medicine)  DIAGNOSIS:  Encounter Diagnosis  Name Primary?  . 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 BRCA reportedly normal.      08/25/2015 Relapse/Recurrence    new solitary liver lesion, biopsy showing metastatic ER PR + breast      09/25/2015 - 03/11/2016 Chemotherapy    Xeloda. Stopped due to progression of liver and bone metastases; pleural metastases      03/31/2016 -  Anti-estrogen oral therapy    Faslodex plus Ibrance (along with Xgeva)       Carcinoma of breast metastatic to bone ([place])   03/27/2016 Initial Diagnosis    Carcinoma of breast metastatic to bone ([place])      08/03/2017 -  Chemotherapy    The patient had fulvestrant (FASLODEX) injection 500 mg, 500 mg, Intramuscular,  Once, 0 of 7 cycles  for chemotherapy treatment.        Breast cancer metastasized to liver, unspecified laterality ([place]) (Resolved)   03/27/2016 Initial Diagnosis    Breast cancer metastasized to liver, unspecified laterality ([place])       CHIEF COMPLIANT:   INTERVAL HISTORY: Brittany Porter is a  REVIEW OF SYSTEMS:   Constitutional: Denies fevers, chills or abnormal weight loss Eyes: Denies blurriness of vision Ears, nose, mouth, throat, and face: Denies mucositis or sore throat Respiratory: Denies cough, dyspnea or  wheezes Cardiovascular: Denies palpitation, chest discomfort Gastrointestinal:  Denies nausea, heartburn or change in bowel habits Skin: Denies abnormal skin rashes Lymphatics: Denies new lymphadenopathy or easy bruising Neurological:Denies numbness, tingling or new weaknesses Behavioral/Psych: Mood is stable, no new changes  Extremities: No lower extremity edema  All other systems were reviewed with the patient and are negative.  I have reviewed the past medical history, past surgical history, social history and family history with the patient and they are unchanged from previous note.  ALLERGIES:  is allergic to sulfa antibiotics.  MEDICATIONS:  Current Outpatient Medications  Medication Sig Dispense Refill  . benzonatate (TESSALON) 100 MG capsule Take 1 capsule (100 mg total) by mouth every 8 (eight) hours as needed for cough. 30 capsule 0  . Calcium Carb-Cholecalciferol (CALCIUM 600+D3) 600-800 MG-UNIT TABS Take 1 tablet by mouth 2 (two) times daily.    . Cholecalciferol (VITAMIN D3) 2000 UNITS TABS Take 2,000 mg by mouth daily.    . ferrous sulfate 325 (65 FE) MG tablet Take 325 mg by mouth daily with breakfast.    . fish oil-omega-3 fatty acids 1000 MG capsule Take 1 g by mouth 2 (two) times daily.     . furosemide (LASIX) 40 MG tablet Take 40 mg by mouth daily as needed for fluid or edema. Reported on 05/07/2015    . glimepiride (AMARYL) 2 MG tablet Take 2 mg by mouth daily with breakfast.    . ibuprofen (ADVIL,MOTRIN) 200  MG tablet Take 400 mg by mouth every 6 (six) hours as needed for headache (pain). Reported on 11/12/2015    . letrozole (FEMARA) 2.5 MG tablet Take 1 tablet (2.5 mg total) by mouth daily. 90 tablet 3  . metFORMIN (GLUCOPHAGE) 500 MG tablet Take 500 mg by mouth 2 (two) times daily with a meal.      . nabumetone (RELAFEN) 750 MG tablet     . nystatin (MYCOSTATIN) powder Apply to right groin 2-3 times a day 30 g 0  . ondansetron (ZOFRAN) 8 MG tablet Take 1 tablet  (8 mg total) by mouth every 8 (eight) hours as needed for nausea or vomiting. (Patient not taking: Reported on 05/26/2016) 20 tablet 1  . palbociclib (IBRANCE) 125 MG capsule Take 1 capsule (125 mg total) by mouth daily with breakfast. Take for 21 days on, 7 days off. Take whole with food. 21 capsule 3  . simvastatin (ZOCOR) 40 MG tablet Take 40 mg by mouth at bedtime.       No current facility-administered medications for this visit.     PHYSICAL EXAMINATION: ECOG PERFORMANCE STATUS: 1 - Symptomatic but completely ambulatory  Vitals:   08/03/17 1157  BP: (!) 147/88  Pulse: 85  Resp: 18  Temp: 98.8 F (37.1 C)  SpO2: 97%   Filed Weights   08/03/17 1157  Weight: 225 lb 14.4 oz (102.5 kg)    GENERAL:alert, no distress and comfortable SKIN: skin color, texture, turgor are normal, no rashes or significant lesions EYES: normal, Conjunctiva are pink and non-injected, sclera clear OROPHARYNX:no exudate, no erythema and lips, buccal mucosa, and tongue normal  NECK: supple, thyroid normal size, non-tender, without nodularity LYMPH:  no palpable lymphadenopathy in the cervical, axillary or inguinal LUNGS: clear to auscultation and percussion with normal breathing effort HEART: regular rate & rhythm and no murmurs and no lower extremity edema ABDOMEN:abdomen soft, non-tender and normal bowel sounds MUSCULOSKELETAL:no cyanosis of digits and no clubbing  NEURO: alert & oriented x 3 with fluent speech, no focal motor/sensory deficits EXTREMITIES: No lower extremity edema  LABORATORY DATA:  I have reviewed the data as listed CMP Latest Ref Rng & Units 08/03/2017 05/02/2017 03/02/2017  Glucose 70 - 140 mg/dL 201(H) 141(H) 359(H)  BUN 7 - 26 mg/dL 15 21.1 25.0  Creatinine 0.60 - 1.10 mg/dL 0.78 0.8 1.0  Sodium 136 - 145 mmol/L 143 141 140  Potassium 3.5 - 5.1 mmol/L 4.6 4.6 4.6  Chloride 98 - 109 mmol/L 106 - -  CO2 22 - 29 mmol/L 27 30(H) 24  Calcium 8.4 - 10.4 mg/dL 8.8 8.8 9.0  Total  Protein 6.4 - 8.3 g/dL 6.3(L) 6.6 7.0  Total Bilirubin 0.2 - 1.2 mg/dL 0.9 0.97 0.93  Alkaline Phos 40 - 150 U/L 45 46 52  AST 5 - 34 U/L '23 23 21  ' ALT 0 - 55 U/L '13 13 18    ' Lab Results  Component Value Date   WBC 3.3 (L) 08/03/2017   HGB 11.9 08/03/2017   HCT 35.9 08/03/2017   MCV 104.1 (H) 08/03/2017   PLT 156 08/03/2017   NEUTROABS 1.7 08/03/2017    ASSESSMENT & PLAN:  Breast cancer metastasized to liver [facility]) Metastatic breast cancer: recently progressive in liver and bone, also pleura. OnFaslodex + Ibrance beginning 03/31/2016  Toxicities with therapy: 1. Neutropenia: Mild does not require dose adjustment. 2. injection site discomfort very mild 3. Fatigue related to Ibrance  CT chest abdomen pelvis for restaging 07/20/2016: Stable left  pleural metastases, right liver lobe metastases unchanged, multiple bone metastases slightly progressive from previous  Xgeva injections every 3 months.  Current treatment:  letrozole + Faslodex and Ibrance Obtain MSI testing and BRCA mutation testing, also PD1 testing on the liver biopsy from 2017  PET scan has been ordered Return to clinic in 3 months for Xgeva and follow-up after scans   I spent 25 minutes talking to the patient of which more than half was spent in counseling and coordination of care.  No orders of the defined types were placed in this encounter.  The patient has a good understanding of the overall plan. she agrees with it. she will call with any problems that may develop before the next visit here.   Harriette Ohara, MD 08/03/17

## 2017-08-03 NOTE — Assessment & Plan Note (Signed)
Metastatic breast cancer: recently progressive in liver and bone, also pleura. OnFaslodex + Ibrance beginning 03/31/2016  Toxicities with therapy: 1. Neutropenia: Mild does not require dose adjustment. 2. injection site discomfort very mild 3. Fatigue related to Ibrance  CT chest abdomen pelvis for restaging 07/20/2016: Stable left pleural metastases, right liver lobe metastases unchanged, multiple bone metastases slightly progressive from previous  Xgeva injections every 3 months.  Current treatment:  letrozole + Faslodex and Ibrance Obtain MSI testing and BRCA mutation testing, also PD1 testing on the liver biopsy from 2017  PET scan has been ordered Return to clinic in 3 months for Xgeva and follow-up after scans

## 2017-08-03 NOTE — Patient Instructions (Signed)
Fulvestrant injection What is this medicine? FULVESTRANT (ful VES trant) blocks the effects of estrogen. It is used to treat breast cancer. This medicine may be used for other purposes; ask your health care provider or pharmacist if you have questions. COMMON BRAND NAME(S): FASLODEX What should I tell my health care provider before I take this medicine? They need to know if you have any of these conditions: -bleeding problems -liver disease -low levels of platelets in the blood -an unusual or allergic reaction to fulvestrant, other medicines, foods, dyes, or preservatives -pregnant or trying to get pregnant -breast-feeding How should I use this medicine? This medicine is for injection into a muscle. It is usually given by a health care professional in a hospital or clinic setting. Talk to your pediatrician regarding the use of this medicine in children. Special care may be needed. Overdosage: If you think you have taken too much of this medicine contact a poison control center or emergency room at once. NOTE: This medicine is only for you. Do not share this medicine with others. What if I miss a dose? It is important not to miss your dose. Call your doctor or health care professional if you are unable to keep an appointment. What may interact with this medicine? -medicines that treat or prevent blood clots like warfarin, enoxaparin, and dalteparin This list may not describe all possible interactions. Give your health care provider a list of all the medicines, herbs, non-prescription drugs, or dietary supplements you use. Also tell them if you smoke, drink alcohol, or use illegal drugs. Some items may interact with your medicine. What should I watch for while using this medicine? Your condition will be monitored carefully while you are receiving this medicine. You will need important blood work done while you are taking this medicine. Do not become pregnant while taking this medicine or for  at least 1 year after stopping it. Women of child-bearing potential will need to have a negative pregnancy test before starting this medicine. Women should inform their doctor if they wish to become pregnant or think they might be pregnant. There is a potential for serious side effects to an unborn child. Men should inform their doctors if they wish to father a child. This medicine may lower sperm counts. Talk to your health care professional or pharmacist for more information. Do not breast-feed an infant while taking this medicine or for 1 year after the last dose. What side effects may I notice from receiving this medicine? Side effects that you should report to your doctor or health care professional as soon as possible: -allergic reactions like skin rash, itching or hives, swelling of the face, lips, or tongue -feeling faint or lightheaded, falls -pain, tingling, numbness, or weakness in the legs -signs and symptoms of infection like fever or chills; cough; flu-like symptoms; sore throat -vaginal bleeding Side effects that usually do not require medical attention (report to your doctor or health care professional if they continue or are bothersome): -aches, pains -constipation -diarrhea -headache -hot flashes -nausea, vomiting -pain at site where injected -stomach pain This list may not describe all possible side effects. Call your doctor for medical advice about side effects. You may report side effects to FDA at 1-800-FDA-1088. Where should I keep my medicine? This drug is given in a hospital or clinic and will not be stored at home. NOTE: This sheet is a summary. It may not cover all possible information. If you have questions about this medicine, talk to your   doctor, pharmacist, or health care provider.  2017 Elsevier/Gold Standard (2014-11-28 11:03:55) Denosumab injection What is this medicine? DENOSUMAB (den oh sue mab) slows bone breakdown. Prolia is used to treat osteoporosis in  women after menopause and in men. Delton See is used to treat a high calcium level due to cancer and to prevent bone fractures and other bone problems caused by multiple myeloma or cancer bone metastases. Delton See is also used to treat giant cell tumor of the bone. This medicine may be used for other purposes; ask your health care provider or pharmacist if you have questions. COMMON BRAND NAME(S): Prolia, XGEVA What should I tell my health care provider before I take this medicine? They need to know if you have any of these conditions: -dental disease -having surgery or tooth extraction -infection -kidney disease -low levels of calcium or Vitamin D in the blood -malnutrition -on hemodialysis -skin conditions or sensitivity -thyroid or parathyroid disease -an unusual reaction to denosumab, other medicines, foods, dyes, or preservatives -pregnant or trying to get pregnant -breast-feeding How should I use this medicine? This medicine is for injection under the skin. It is given by a health care professional in a hospital or clinic setting. If you are getting Prolia, a special MedGuide will be given to you by the pharmacist with each prescription and refill. Be sure to read this information carefully each time. For Prolia, talk to your pediatrician regarding the use of this medicine in children. Special care may be needed. For Delton See, talk to your pediatrician regarding the use of this medicine in children. While this drug may be prescribed for children as young as 13 years for selected conditions, precautions do apply. Overdosage: If you think you have taken too much of this medicine contact a poison control center or emergency room at once. NOTE: This medicine is only for you. Do not share this medicine with others. What if I miss a dose? It is important not to miss your dose. Call your doctor or health care professional if you are unable to keep an appointment. What may interact with this  medicine? Do not take this medicine with any of the following medications: -other medicines containing denosumab This medicine may also interact with the following medications: -medicines that lower your chance of fighting infection -steroid medicines like prednisone or cortisone This list may not describe all possible interactions. Give your health care provider a list of all the medicines, herbs, non-prescription drugs, or dietary supplements you use. Also tell them if you smoke, drink alcohol, or use illegal drugs. Some items may interact with your medicine. What should I watch for while using this medicine? Visit your doctor or health care professional for regular checks on your progress. Your doctor or health care professional may order blood tests and other tests to see how you are doing. Call your doctor or health care professional for advice if you get a fever, chills or sore throat, or other symptoms of a cold or flu. Do not treat yourself. This drug may decrease your body's ability to fight infection. Try to avoid being around people who are sick. You should make sure you get enough calcium and vitamin D while you are taking this medicine, unless your doctor tells you not to. Discuss the foods you eat and the vitamins you take with your health care professional. See your dentist regularly. Brush and floss your teeth as directed. Before you have any dental work done, tell your dentist you are receiving this medicine. Do  not become pregnant while taking this medicine or for 5 months after stopping it. Talk with your doctor or health care professional about your birth control options while taking this medicine. Women should inform their doctor if they wish to become pregnant or think they might be pregnant. There is a potential for serious side effects to an unborn child. Talk to your health care professional or pharmacist for more information. What side effects may I notice from receiving this  medicine? Side effects that you should report to your doctor or health care professional as soon as possible: -allergic reactions like skin rash, itching or hives, swelling of the face, lips, or tongue -bone pain -breathing problems -dizziness -jaw pain, especially after dental work -redness, blistering, peeling of the skin -signs and symptoms of infection like fever or chills; cough; sore throat; pain or trouble passing urine -signs of low calcium like fast heartbeat, muscle cramps or muscle pain; pain, tingling, numbness in the hands or feet; seizures -unusual bleeding or bruising -unusually weak or tired Side effects that usually do not require medical attention (report to your doctor or health care professional if they continue or are bothersome): -constipation -diarrhea -headache -joint pain -loss of appetite -muscle pain -runny nose -tiredness -upset stomach This list may not describe all possible side effects. Call your doctor for medical advice about side effects. You may report side effects to FDA at 1-800-FDA-1088. Where should I keep my medicine? This medicine is only given in a clinic, doctor's office, or other health care setting and will not be stored at home. NOTE: This sheet is a summary. It may not cover all possible information. If you have questions about this medicine, talk to your doctor, pharmacist, or health care provider.  2018 Elsevier/Gold Standard (2016-05-24 19:17:21)  

## 2017-08-04 ENCOUNTER — Other Ambulatory Visit (HOSPITAL_COMMUNITY)
Admission: RE | Admit: 2017-08-04 | Discharge: 2017-08-04 | Disposition: A | Discharge status: Home / Self Care | Payer: Medicare Other | Source: Ambulatory Visit | Attending: Hematology and Oncology | Admitting: Hematology and Oncology

## 2017-08-04 DIAGNOSIS — C50919 Malignant neoplasm of unspecified site of unspecified female breast: Secondary | ICD-10-CM | POA: Insufficient documentation

## 2017-08-04 DIAGNOSIS — C799 Secondary malignant neoplasm of unspecified site: Secondary | ICD-10-CM | POA: Insufficient documentation

## 2017-08-08 ENCOUNTER — Other Ambulatory Visit: Payer: Self-pay | Admitting: Pharmacist

## 2017-08-24 MED FILL — IBRANCE 125 MG CAPSULE: 125 | 21 days supply | Qty: 21 | Fill #3

## 2017-08-30 ENCOUNTER — Other Ambulatory Visit: Payer: Self-pay

## 2017-08-30 DIAGNOSIS — C50919 Malignant neoplasm of unspecified site of unspecified female breast: Secondary | ICD-10-CM

## 2017-08-30 DIAGNOSIS — C782 Secondary malignant neoplasm of pleura: Principal | ICD-10-CM

## 2017-08-31 ENCOUNTER — Inpatient Hospital Stay: Payer: Medicare Other | Attending: Hematology and Oncology

## 2017-08-31 ENCOUNTER — Inpatient Hospital Stay: Payer: Medicare Other

## 2017-08-31 DIAGNOSIS — C787 Secondary malignant neoplasm of liver and intrahepatic bile duct: Secondary | ICD-10-CM | POA: Diagnosis not present

## 2017-08-31 DIAGNOSIS — C782 Secondary malignant neoplasm of pleura: Secondary | ICD-10-CM | POA: Diagnosis not present

## 2017-08-31 DIAGNOSIS — C50919 Malignant neoplasm of unspecified site of unspecified female breast: Secondary | ICD-10-CM

## 2017-08-31 DIAGNOSIS — Z5111 Encounter for antineoplastic chemotherapy: Secondary | ICD-10-CM | POA: Diagnosis not present

## 2017-08-31 DIAGNOSIS — C7951 Secondary malignant neoplasm of bone: Secondary | ICD-10-CM | POA: Diagnosis not present

## 2017-08-31 LAB — CBC WITH DIFFERENTIAL (CANCER CENTER ONLY)
Basophils Absolute: 0.1 10*3/uL (ref 0.0–0.1)
Basophils Relative: 3 %
Eosinophils Absolute: 0 10*3/uL (ref 0.0–0.5)
Eosinophils Relative: 1 %
HCT: 35.3 % (ref 34.8–46.6)
Hemoglobin: 12 g/dL (ref 11.6–15.9)
Lymphocytes Relative: 29 %
Lymphs Abs: 0.9 10*3/uL (ref 0.9–3.3)
MCH: 35 pg — ABNORMAL HIGH (ref 25.1–34.0)
MCHC: 33.9 g/dL (ref 31.5–36.0)
MCV: 103.4 fL — ABNORMAL HIGH (ref 79.5–101.0)
Monocytes Absolute: 0.5 10*3/uL (ref 0.1–0.9)
Monocytes Relative: 16 %
Neutro Abs: 1.6 10*3/uL (ref 1.5–6.5)
Neutrophils Relative %: 51 %
Platelet Count: 146 10*3/uL (ref 145–400)
RBC: 3.41 MIL/uL — ABNORMAL LOW (ref 3.70–5.45)
RDW: 16.5 % — ABNORMAL HIGH (ref 11.2–14.5)
WBC Count: 3.1 10*3/uL — ABNORMAL LOW (ref 3.9–10.3)

## 2017-08-31 LAB — COMPREHENSIVE METABOLIC PANEL
ALT: 14 U/L (ref 0–55)
AST: 20 U/L (ref 5–34)
Albumin: 3.6 g/dL (ref 3.5–5.0)
Alkaline Phosphatase: 44 U/L (ref 40–150)
Anion gap: 7 (ref 3–11)
BUN: 17 mg/dL (ref 7–26)
CO2: 30 mmol/L — ABNORMAL HIGH (ref 22–29)
Calcium: 9.1 mg/dL (ref 8.4–10.4)
Chloride: 106 mmol/L (ref 98–109)
Creatinine, Ser: 0.77 mg/dL (ref 0.60–1.10)
GFR calc Af Amer: >60 mL/min (ref >60)
GFR calc non Af Amer: >60 mL/min (ref >60)
Glucose, Bld: 168 mg/dL — ABNORMAL HIGH (ref 70–140)
Potassium: 5 mmol/L (ref 3.5–5.1)
Sodium: 143 mmol/L (ref 136–145)
Total Bilirubin: 0.8 mg/dL (ref 0.2–1.2)
Total Protein: 6.5 g/dL (ref 6.4–8.3)

## 2017-08-31 MED ORDER — FULVESTRANT 250 MG/5ML IM SOLN
INTRAMUSCULAR | Status: AC
Start: 2017-08-31 — End: 2017-08-31
  Filled 2017-08-31: qty 10

## 2017-08-31 MED ORDER — FULVESTRANT 250 MG/5ML IM SOLN
500.0000 mg | Freq: Once | INTRAMUSCULAR | Status: AC
  Administered 2017-08-31: 500 mg via INTRAMUSCULAR

## 2017-08-31 NOTE — Patient Instructions (Signed)
Fulvestrant injection What is this medicine? FULVESTRANT (ful VES trant) blocks the effects of estrogen. It is used to treat breast cancer. This medicine may be used for other purposes; ask your health care provider or pharmacist if you have questions. COMMON BRAND NAME(S): FASLODEX What should I tell my health care provider before I take this medicine? They need to know if you have any of these conditions: -bleeding problems -liver disease -low levels of platelets in the blood -an unusual or allergic reaction to fulvestrant, other medicines, foods, dyes, or preservatives -pregnant or trying to get pregnant -breast-feeding How should I use this medicine? This medicine is for injection into a muscle. It is usually given by a health care professional in a hospital or clinic setting. Talk to your pediatrician regarding the use of this medicine in children. Special care may be needed. Overdosage: If you think you have taken too much of this medicine contact a poison control center or emergency room at once. NOTE: This medicine is only for you. Do not share this medicine with others. What if I miss a dose? It is important not to miss your dose. Call your doctor or health care professional if you are unable to keep an appointment. What may interact with this medicine? -medicines that treat or prevent blood clots like warfarin, enoxaparin, and dalteparin This list may not describe all possible interactions. Give your health care provider a list of all the medicines, herbs, non-prescription drugs, or dietary supplements you use. Also tell them if you smoke, drink alcohol, or use illegal drugs. Some items may interact with your medicine. What should I watch for while using this medicine? Your condition will be monitored carefully while you are receiving this medicine. You will need important blood work done while you are taking this medicine. Do not become pregnant while taking this medicine or for  at least 1 year after stopping it. Women of child-bearing potential will need to have a negative pregnancy test before starting this medicine. Women should inform their doctor if they wish to become pregnant or think they might be pregnant. There is a potential for serious side effects to an unborn child. Men should inform their doctors if they wish to father a child. This medicine may lower sperm counts. Talk to your health care professional or pharmacist for more information. Do not breast-feed an infant while taking this medicine or for 1 year after the last dose. What side effects may I notice from receiving this medicine? Side effects that you should report to your doctor or health care professional as soon as possible: -allergic reactions like skin rash, itching or hives, swelling of the face, lips, or tongue -feeling faint or lightheaded, falls -pain, tingling, numbness, or weakness in the legs -signs and symptoms of infection like fever or chills; cough; flu-like symptoms; sore throat -vaginal bleeding Side effects that usually do not require medical attention (report to your doctor or health care professional if they continue or are bothersome): -aches, pains -constipation -diarrhea -headache -hot flashes -nausea, vomiting -pain at site where injected -stomach pain This list may not describe all possible side effects. Call your doctor for medical advice about side effects. You may report side effects to FDA at 1-800-FDA-1088. Where should I keep my medicine? This drug is given in a hospital or clinic and will not be stored at home. NOTE: This sheet is a summary. It may not cover all possible information. If you have questions about this medicine, talk to your   doctor, pharmacist, or health care provider.  2017 Elsevier/Gold Standard (2014-11-28 11:03:55) Denosumab injection What is this medicine? DENOSUMAB (den oh sue mab) slows bone breakdown. Prolia is used to treat osteoporosis in  women after menopause and in men. Delton See is used to treat a high calcium level due to cancer and to prevent bone fractures and other bone problems caused by multiple myeloma or cancer bone metastases. Delton See is also used to treat giant cell tumor of the bone. This medicine may be used for other purposes; ask your health care provider or pharmacist if you have questions. COMMON BRAND NAME(S): Prolia, XGEVA What should I tell my health care provider before I take this medicine? They need to know if you have any of these conditions: -dental disease -having surgery or tooth extraction -infection -kidney disease -low levels of calcium or Vitamin D in the blood -malnutrition -on hemodialysis -skin conditions or sensitivity -thyroid or parathyroid disease -an unusual reaction to denosumab, other medicines, foods, dyes, or preservatives -pregnant or trying to get pregnant -breast-feeding How should I use this medicine? This medicine is for injection under the skin. It is given by a health care professional in a hospital or clinic setting. If you are getting Prolia, a special MedGuide will be given to you by the pharmacist with each prescription and refill. Be sure to read this information carefully each time. For Prolia, talk to your pediatrician regarding the use of this medicine in children. Special care may be needed. For Delton See, talk to your pediatrician regarding the use of this medicine in children. While this drug may be prescribed for children as young as 13 years for selected conditions, precautions do apply. Overdosage: If you think you have taken too much of this medicine contact a poison control center or emergency room at once. NOTE: This medicine is only for you. Do not share this medicine with others. What if I miss a dose? It is important not to miss your dose. Call your doctor or health care professional if you are unable to keep an appointment. What may interact with this  medicine? Do not take this medicine with any of the following medications: -other medicines containing denosumab This medicine may also interact with the following medications: -medicines that lower your chance of fighting infection -steroid medicines like prednisone or cortisone This list may not describe all possible interactions. Give your health care provider a list of all the medicines, herbs, non-prescription drugs, or dietary supplements you use. Also tell them if you smoke, drink alcohol, or use illegal drugs. Some items may interact with your medicine. What should I watch for while using this medicine? Visit your doctor or health care professional for regular checks on your progress. Your doctor or health care professional may order blood tests and other tests to see how you are doing. Call your doctor or health care professional for advice if you get a fever, chills or sore throat, or other symptoms of a cold or flu. Do not treat yourself. This drug may decrease your body's ability to fight infection. Try to avoid being around people who are sick. You should make sure you get enough calcium and vitamin D while you are taking this medicine, unless your doctor tells you not to. Discuss the foods you eat and the vitamins you take with your health care professional. See your dentist regularly. Brush and floss your teeth as directed. Before you have any dental work done, tell your dentist you are receiving this medicine. Do  not become pregnant while taking this medicine or for 5 months after stopping it. Talk with your doctor or health care professional about your birth control options while taking this medicine. Women should inform their doctor if they wish to become pregnant or think they might be pregnant. There is a potential for serious side effects to an unborn child. Talk to your health care professional or pharmacist for more information. What side effects may I notice from receiving this  medicine? Side effects that you should report to your doctor or health care professional as soon as possible: -allergic reactions like skin rash, itching or hives, swelling of the face, lips, or tongue -bone pain -breathing problems -dizziness -jaw pain, especially after dental work -redness, blistering, peeling of the skin -signs and symptoms of infection like fever or chills; cough; sore throat; pain or trouble passing urine -signs of low calcium like fast heartbeat, muscle cramps or muscle pain; pain, tingling, numbness in the hands or feet; seizures -unusual bleeding or bruising -unusually weak or tired Side effects that usually do not require medical attention (report to your doctor or health care professional if they continue or are bothersome): -constipation -diarrhea -headache -joint pain -loss of appetite -muscle pain -runny nose -tiredness -upset stomach This list may not describe all possible side effects. Call your doctor for medical advice about side effects. You may report side effects to FDA at 1-800-FDA-1088. Where should I keep my medicine? This medicine is only given in a clinic, doctor's office, or other health care setting and will not be stored at home. NOTE: This sheet is a summary. It may not cover all possible information. If you have questions about this medicine, talk to your doctor, pharmacist, or health care provider.  2018 Elsevier/Gold Standard (2016-05-24 19:17:21)  

## 2017-09-22 ENCOUNTER — Telehealth: Payer: Self-pay | Admitting: *Deleted

## 2017-09-22 ENCOUNTER — Encounter: Payer: Self-pay | Admitting: Hematology and Oncology

## 2017-09-22 NOTE — Telephone Encounter (Signed)
This RN spoke with pt per her call with concern due to " I have a $3800 bill from the shots that I owe "  " I didn't think I had anything to pay but then today I was told I have a bill that I am responsible for "  " I am going to have to stop the shots because I can't pay this much "  Brittany Porter is getting xgeva and faslodex for her stage 4 breast cancer diagnosis. She is also on Ibrance but has resources that assist with payment for this medication.  Plan per call is this note will be forwarded to our managed care and support services for possible resources for payment concerns. This note will also be sent to MD for his review.  Pt is scheduled for her next shot on 5/16 and would like to know if she should come due to cost concerns.  This RN informed pt hopefully we will be able to get her answers early next week and requested her to call again on 5/14 so for update.  Pt verbalized understanding.

## 2017-09-22 NOTE — Telephone Encounter (Signed)
See other entry same date 

## 2017-09-22 NOTE — Progress Notes (Signed)
Rcvd staff msg from [facility] regarding pt possibly stopping her shots because she can't afford to pay for them.  After researching, I found Brittany Porter got her assistance w/ [employer] for [place] so I reached out to [place] to inquire if [employer] will cover Xgeva and Faslodex since no other foundation has funding available for her Dx at this time.  She stated a rep told her [employer] would cover Faslodex but Delton See is not a covered product under this particular grant.  I informed the pt and Val of this info.

## 2017-09-25 ENCOUNTER — Telehealth: Payer: Self-pay | Admitting: Hematology and Oncology

## 2017-09-25 NOTE — Telephone Encounter (Signed)
Left vm for Brittany Porter with [employer] regarding a medical request faxed to [place] on 09/22/17 wanting a bone density scan.  Informed Brittany Porter that the last one performed was 11/16 by Dr. Marko Plume.

## 2017-09-26 ENCOUNTER — Other Ambulatory Visit: Payer: Self-pay | Admitting: Hematology and Oncology

## 2017-09-26 DIAGNOSIS — C50919 Malignant neoplasm of unspecified site of unspecified female breast: Secondary | ICD-10-CM

## 2017-09-27 ENCOUNTER — Telehealth: Payer: Self-pay | Admitting: *Deleted

## 2017-09-27 NOTE — Telephone Encounter (Signed)
This RN spoke with [employer] tech regarding possible assistance for this patient with her medications- Rob states pt may be able to obtain drug replacement thru manufacturer.  This RN spoke with pt per above and requested her to bring proof of income ( like when she applied for [employer] assistance),  Safira will bring above and understands she is to meet with Rob prior to receiving the injection.

## 2017-09-28 ENCOUNTER — Telehealth: Payer: Self-pay | Admitting: *Deleted

## 2017-09-28 ENCOUNTER — Inpatient Hospital Stay: Payer: Medicare Other | Attending: Hematology and Oncology

## 2017-09-28 ENCOUNTER — Inpatient Hospital Stay: Payer: Medicare Other

## 2017-09-28 DIAGNOSIS — C787 Secondary malignant neoplasm of liver and intrahepatic bile duct: Secondary | ICD-10-CM | POA: Insufficient documentation

## 2017-09-28 DIAGNOSIS — C7951 Secondary malignant neoplasm of bone: Secondary | ICD-10-CM | POA: Insufficient documentation

## 2017-09-28 DIAGNOSIS — Z5111 Encounter for antineoplastic chemotherapy: Secondary | ICD-10-CM | POA: Insufficient documentation

## 2017-09-28 DIAGNOSIS — C50912 Malignant neoplasm of unspecified site of left female breast: Secondary | ICD-10-CM

## 2017-09-28 DIAGNOSIS — C50911 Malignant neoplasm of unspecified site of right female breast: Secondary | ICD-10-CM

## 2017-09-28 DIAGNOSIS — C50919 Malignant neoplasm of unspecified site of unspecified female breast: Secondary | ICD-10-CM | POA: Diagnosis present

## 2017-09-28 DIAGNOSIS — C782 Secondary malignant neoplasm of pleura: Secondary | ICD-10-CM | POA: Diagnosis not present

## 2017-09-28 LAB — COMPREHENSIVE METABOLIC PANEL
ALT: 21 U/L (ref 0–55)
AST: 32 U/L (ref 5–34)
Albumin: 3.6 g/dL (ref 3.5–5.0)
Alkaline Phosphatase: 50 U/L (ref 40–150)
Anion gap: 9 (ref 3–11)
BUN: 15 mg/dL (ref 7–26)
CO2: 28 mmol/L (ref 22–29)
Calcium: 8.3 mg/dL — ABNORMAL LOW (ref 8.4–10.4)
Chloride: 105 mmol/L (ref 98–109)
Creatinine, Ser: 0.8 mg/dL (ref 0.60–1.10)
GFR calc Af Amer: >60 mL/min (ref >60)
GFR calc non Af Amer: >60 mL/min (ref >60)
Glucose, Bld: 304 mg/dL — ABNORMAL HIGH (ref 70–140)
Potassium: 4.5 mmol/L (ref 3.5–5.1)
Sodium: 142 mmol/L (ref 136–145)
Total Bilirubin: 0.7 mg/dL (ref 0.2–1.2)
Total Protein: 6.3 g/dL — ABNORMAL LOW (ref 6.4–8.3)

## 2017-09-28 MED ORDER — FULVESTRANT 250 MG/5ML IM SOLN: 500.0000 mg | Freq: Once | INTRAMUSCULAR | Status: DC

## 2017-09-28 MED FILL — IBRANCE 125 MG CAPSULE: 125 | 28 days supply | Qty: 21 | Fill #0

## 2017-09-28 NOTE — Telephone Encounter (Signed)
This RN spoke with pt post her meeting with [facility] Pharm tech per drug replacement for faslodex.  Per Rob- pt should qualify for drug replacement for faslodex- best to reschedule injection until official authorization so pt would not obtain an additional cost for injection today.  Above discussed with pt who verbalized understanding.  Per review with MD - recommended review for cost of zometa vs xgeva for this pt who is on a very limited income.  Pt is due to receive her next xgeva dose in June - and may change to zometa if cost of out pocket is better for patient.  This note will be forwarded to appropriate staff for assistance with above.

## 2017-09-28 NOTE — Patient Instructions (Signed)
Fulvestrant injection What is this medicine? FULVESTRANT (ful VES trant) blocks the effects of estrogen. It is used to treat breast cancer. This medicine may be used for other purposes; ask your health care provider or pharmacist if you have questions. COMMON BRAND NAME(S): FASLODEX What should I tell my health care provider before I take this medicine? They need to know if you have any of these conditions: -bleeding problems -liver disease -low levels of platelets in the blood -an unusual or allergic reaction to fulvestrant, other medicines, foods, dyes, or preservatives -pregnant or trying to get pregnant -breast-feeding How should I use this medicine? This medicine is for injection into a muscle. It is usually given by a health care professional in a hospital or clinic setting. Talk to your pediatrician regarding the use of this medicine in children. Special care may be needed. Overdosage: If you think you have taken too much of this medicine contact a poison control center or emergency room at once. NOTE: This medicine is only for you. Do not share this medicine with others. What if I miss a dose? It is important not to miss your dose. Call your doctor or health care professional if you are unable to keep an appointment. What may interact with this medicine? -medicines that treat or prevent blood clots like warfarin, enoxaparin, and dalteparin This list may not describe all possible interactions. Give your health care provider a list of all the medicines, herbs, non-prescription drugs, or dietary supplements you use. Also tell them if you smoke, drink alcohol, or use illegal drugs. Some items may interact with your medicine. What should I watch for while using this medicine? Your condition will be monitored carefully while you are receiving this medicine. You will need important blood work done while you are taking this medicine. Do not become pregnant while taking this medicine or for  at least 1 year after stopping it. Women of child-bearing potential will need to have a negative pregnancy test before starting this medicine. Women should inform their doctor if they wish to become pregnant or think they might be pregnant. There is a potential for serious side effects to an unborn child. Men should inform their doctors if they wish to father a child. This medicine may lower sperm counts. Talk to your health care professional or pharmacist for more information. Do not breast-feed an infant while taking this medicine or for 1 year after the last dose. What side effects may I notice from receiving this medicine? Side effects that you should report to your doctor or health care professional as soon as possible: -allergic reactions like skin rash, itching or hives, swelling of the face, lips, or tongue -feeling faint or lightheaded, falls -pain, tingling, numbness, or weakness in the legs -signs and symptoms of infection like fever or chills; cough; flu-like symptoms; sore throat -vaginal bleeding Side effects that usually do not require medical attention (report to your doctor or health care professional if they continue or are bothersome): -aches, pains -constipation -diarrhea -headache -hot flashes -nausea, vomiting -pain at site where injected -stomach pain This list may not describe all possible side effects. Call your doctor for medical advice about side effects. You may report side effects to FDA at 1-800-FDA-1088. Where should I keep my medicine? This drug is given in a hospital or clinic and will not be stored at home. NOTE: This sheet is a summary. It may not cover all possible information. If you have questions about this medicine, talk to your   doctor, pharmacist, or health care provider.  2017 Elsevier/Gold Standard (2014-11-28 11:03:55) Denosumab injection What is this medicine? DENOSUMAB (den oh sue mab) slows bone breakdown. Prolia is used to treat osteoporosis in  women after menopause and in men. Delton See is used to treat a high calcium level due to cancer and to prevent bone fractures and other bone problems caused by multiple myeloma or cancer bone metastases. Delton See is also used to treat giant cell tumor of the bone. This medicine may be used for other purposes; ask your health care provider or pharmacist if you have questions. COMMON BRAND NAME(S): Prolia, XGEVA What should I tell my health care provider before I take this medicine? They need to know if you have any of these conditions: -dental disease -having surgery or tooth extraction -infection -kidney disease -low levels of calcium or Vitamin D in the blood -malnutrition -on hemodialysis -skin conditions or sensitivity -thyroid or parathyroid disease -an unusual reaction to denosumab, other medicines, foods, dyes, or preservatives -pregnant or trying to get pregnant -breast-feeding How should I use this medicine? This medicine is for injection under the skin. It is given by a health care professional in a hospital or clinic setting. If you are getting Prolia, a special MedGuide will be given to you by the pharmacist with each prescription and refill. Be sure to read this information carefully each time. For Prolia, talk to your pediatrician regarding the use of this medicine in children. Special care may be needed. For Delton See, talk to your pediatrician regarding the use of this medicine in children. While this drug may be prescribed for children as young as 13 years for selected conditions, precautions do apply. Overdosage: If you think you have taken too much of this medicine contact a poison control center or emergency room at once. NOTE: This medicine is only for you. Do not share this medicine with others. What if I miss a dose? It is important not to miss your dose. Call your doctor or health care professional if you are unable to keep an appointment. What may interact with this  medicine? Do not take this medicine with any of the following medications: -other medicines containing denosumab This medicine may also interact with the following medications: -medicines that lower your chance of fighting infection -steroid medicines like prednisone or cortisone This list may not describe all possible interactions. Give your health care provider a list of all the medicines, herbs, non-prescription drugs, or dietary supplements you use. Also tell them if you smoke, drink alcohol, or use illegal drugs. Some items may interact with your medicine. What should I watch for while using this medicine? Visit your doctor or health care professional for regular checks on your progress. Your doctor or health care professional may order blood tests and other tests to see how you are doing. Call your doctor or health care professional for advice if you get a fever, chills or sore throat, or other symptoms of a cold or flu. Do not treat yourself. This drug may decrease your body's ability to fight infection. Try to avoid being around people who are sick. You should make sure you get enough calcium and vitamin D while you are taking this medicine, unless your doctor tells you not to. Discuss the foods you eat and the vitamins you take with your health care professional. See your dentist regularly. Brush and floss your teeth as directed. Before you have any dental work done, tell your dentist you are receiving this medicine. Do  not become pregnant while taking this medicine or for 5 months after stopping it. Talk with your doctor or health care professional about your birth control options while taking this medicine. Women should inform their doctor if they wish to become pregnant or think they might be pregnant. There is a potential for serious side effects to an unborn child. Talk to your health care professional or pharmacist for more information. What side effects may I notice from receiving this  medicine? Side effects that you should report to your doctor or health care professional as soon as possible: -allergic reactions like skin rash, itching or hives, swelling of the face, lips, or tongue -bone pain -breathing problems -dizziness -jaw pain, especially after dental work -redness, blistering, peeling of the skin -signs and symptoms of infection like fever or chills; cough; sore throat; pain or trouble passing urine -signs of low calcium like fast heartbeat, muscle cramps or muscle pain; pain, tingling, numbness in the hands or feet; seizures -unusual bleeding or bruising -unusually weak or tired Side effects that usually do not require medical attention (report to your doctor or health care professional if they continue or are bothersome): -constipation -diarrhea -headache -joint pain -loss of appetite -muscle pain -runny nose -tiredness -upset stomach This list may not describe all possible side effects. Call your doctor for medical advice about side effects. You may report side effects to FDA at 1-800-FDA-1088. Where should I keep my medicine? This medicine is only given in a clinic, doctor's office, or other health care setting and will not be stored at home. NOTE: This sheet is a summary. It may not cover all possible information. If you have questions about this medicine, talk to your doctor, pharmacist, or health care provider.  2018 Elsevier/Gold Standard (2016-05-24 19:17:21)  

## 2017-10-04 ENCOUNTER — Telehealth: Payer: Self-pay | Admitting: *Deleted

## 2017-10-04 NOTE — Telephone Encounter (Signed)
This RN returned call to pt and obtained her answering machine.  Pt left message inquiring about assistance with faslodex - which is being processed by [employer] for drug replacement.  Pt is scheduled this Friday for injection - with goal to have replacement approved so she would not incur further charges.  If approval is not completed pt should reschedule injection to next week.  Dr Lindi Adie is aware of the above.

## 2017-10-04 NOTE — Telephone Encounter (Signed)
This RN spoke with Brittany Porter post obtaining information that Brittany Porter has been approved for drug replacement but drug not received. Best to reschedule her faslodex to next week so drug is available per pharmacy.  Brittany Porter was rescheduled to 10/11/2017.  This RN also sent a message to Brittany Porter in [place] per inquiry for prior authorization for zometa and an out pocket cost to the patient - due to Brittany Porter cannot afford the denosamaub injections ( she has a $3000 bill presently since starting injections in March 2019)  Brittany Porter is scheduled to see Dr Lindi Adie on 6/12 - and if above approved at better cost Brittany Porter can receive it same day.

## 2017-10-06 ENCOUNTER — Inpatient Hospital Stay: Payer: Medicare Other

## 2017-10-06 ENCOUNTER — Telehealth: Payer: Self-pay

## 2017-10-06 NOTE — Telephone Encounter (Signed)
Per Rob in pharmacy, Faslodex has been temporarily approved and will ship in 3-5 days. Outgoing call this am to pt and she has her appt for next week to get injection.  Pt verbalized about being concerned about previous bills when she thinks it was not covered.  Contacted Lenise in [place] and she will reach out to pt regarding her financial concerns over Faslodex.  Outgoing call to pt- I told I would let her know what I had found out regarding pt assistance, she reported she has received a call from [facility] and she verbalized understanding per Lenise that she was covered for Faslodex "because her grant started in Jan. 2019".  No other needs per pt at this time.

## 2017-10-10 ENCOUNTER — Other Ambulatory Visit: Payer: Self-pay | Admitting: Hematology and Oncology

## 2017-10-11 ENCOUNTER — Telehealth: Payer: Self-pay

## 2017-10-11 ENCOUNTER — Inpatient Hospital Stay: Payer: Medicare Other

## 2017-10-11 DIAGNOSIS — Z5111 Encounter for antineoplastic chemotherapy: Secondary | ICD-10-CM | POA: Diagnosis not present

## 2017-10-11 DIAGNOSIS — C50919 Malignant neoplasm of unspecified site of unspecified female breast: Secondary | ICD-10-CM

## 2017-10-11 DIAGNOSIS — C7951 Secondary malignant neoplasm of bone: Principal | ICD-10-CM

## 2017-10-11 MED ORDER — FULVESTRANT 250 MG/5ML IM SOLN
500.0000 mg | Freq: Once | INTRAMUSCULAR | Status: AC
  Administered 2017-10-11: 500 mg via INTRAMUSCULAR

## 2017-10-11 MED ORDER — DENOSUMAB 120 MG/1.7ML ~~LOC~~ SOLN
SUBCUTANEOUS | Status: AC
  Filled 2017-10-11: qty 1.7

## 2017-10-11 MED ORDER — FULVESTRANT 250 MG/5ML IM SOLN
INTRAMUSCULAR | Status: AC
  Filled 2017-10-11: qty 10

## 2017-10-11 NOTE — Telephone Encounter (Signed)
Per pharmacy [facility], I informed Dr Lindi Adie that pt has injection appt today and does Dr Lindi Adie want to proceed with Faslodex today and cancel 6/12 appt and put next Faslodex at end of June?   Per Dr Lindi Adie, can cancel pt appt on  6/12, and change to 6/26 (Wed) the appts for that day will be: lab, Dr Lindi Adie, Injection, Infusion. I also noted an order for Zomata, I called Dee in [facility] and she said there is no prior auth required, I notified Dr Lindi Adie and he said it can also be scheduled with next appt at end of June (6/26) I have notified pt via phone of the appt for 6/26 (and to expect 6/12 to be cancelled) and to be expecting a call from scheduling for the time to arrive. Pt voiced understanding.   I sent an inbasket to scheduling dept.

## 2017-10-13 ENCOUNTER — Encounter: Payer: Self-pay | Admitting: Pharmacy Technician

## 2017-10-13 NOTE — Progress Notes (Signed)
The patient is approved for drug assistance from [employer] for Faslodex enrollment from 10/06/17 - 10/07/18. Enrollment is based on excessive OOP. Drug assistance will begin on DOS 10/11/17.

## 2017-10-17 ENCOUNTER — Telehealth: Payer: Self-pay | Admitting: Hematology and Oncology

## 2017-10-17 NOTE — Telephone Encounter (Signed)
Mailed patient calendar of upcoming June appointments per 6/4 sch message

## 2017-10-23 MED FILL — IBRANCE 125 MG CAPSULE: 125 | 28 days supply | Qty: 21 | Fill #1

## 2017-10-25 ENCOUNTER — Other Ambulatory Visit: Payer: Medicare Other

## 2017-10-25 ENCOUNTER — Inpatient Hospital Stay: Payer: Medicare Other

## 2017-10-25 ENCOUNTER — Ambulatory Visit: Payer: Medicare Other | Admitting: Hematology and Oncology

## 2017-11-07 ENCOUNTER — Other Ambulatory Visit: Payer: Self-pay

## 2017-11-07 DIAGNOSIS — C787 Secondary malignant neoplasm of liver and intrahepatic bile duct: Principal | ICD-10-CM

## 2017-11-07 DIAGNOSIS — C782 Secondary malignant neoplasm of pleura: Secondary | ICD-10-CM

## 2017-11-07 DIAGNOSIS — C50919 Malignant neoplasm of unspecified site of unspecified female breast: Secondary | ICD-10-CM

## 2017-11-08 ENCOUNTER — Other Ambulatory Visit: Payer: Self-pay

## 2017-11-08 ENCOUNTER — Inpatient Hospital Stay: Payer: Medicare Other

## 2017-11-08 ENCOUNTER — Encounter: Payer: Self-pay | Admitting: Hematology and Oncology

## 2017-11-08 ENCOUNTER — Inpatient Hospital Stay (HOSPITAL_BASED_OUTPATIENT_CLINIC_OR_DEPARTMENT_OTHER): Payer: Medicare Other | Admitting: Hematology and Oncology

## 2017-11-08 ENCOUNTER — Telehealth: Payer: Self-pay

## 2017-11-08 ENCOUNTER — Other Ambulatory Visit: Payer: Self-pay | Admitting: *Deleted

## 2017-11-08 ENCOUNTER — Inpatient Hospital Stay: Payer: Medicare Other | Attending: Hematology and Oncology

## 2017-11-08 ENCOUNTER — Telehealth: Payer: Self-pay | Admitting: Hematology and Oncology

## 2017-11-08 DIAGNOSIS — C7951 Secondary malignant neoplasm of bone: Secondary | ICD-10-CM | POA: Insufficient documentation

## 2017-11-08 DIAGNOSIS — C50919 Malignant neoplasm of unspecified site of unspecified female breast: Secondary | ICD-10-CM | POA: Insufficient documentation

## 2017-11-08 DIAGNOSIS — D701 Agranulocytosis secondary to cancer chemotherapy: Secondary | ICD-10-CM | POA: Diagnosis not present

## 2017-11-08 DIAGNOSIS — C782 Secondary malignant neoplasm of pleura: Secondary | ICD-10-CM | POA: Insufficient documentation

## 2017-11-08 DIAGNOSIS — R530 Neoplastic (malignant) related fatigue: Secondary | ICD-10-CM

## 2017-11-08 DIAGNOSIS — Z5111 Encounter for antineoplastic chemotherapy: Secondary | ICD-10-CM | POA: Diagnosis not present

## 2017-11-08 DIAGNOSIS — C787 Secondary malignant neoplasm of liver and intrahepatic bile duct: Principal | ICD-10-CM

## 2017-11-08 LAB — CMP (CANCER CENTER ONLY)
ALT: 17 U/L (ref 0–44)
AST: 30 U/L (ref 15–41)
Albumin: 4.1 g/dL (ref 3.5–5.0)
Alkaline Phosphatase: 41 U/L (ref 38–126)
Anion gap: 12 (ref 5–15)
BUN: 24 mg/dL — ABNORMAL HIGH (ref 8–23)
CO2: 29 mmol/L (ref 22–32)
Calcium: 9.8 mg/dL (ref 8.9–10.3)
Chloride: 100 mmol/L (ref 98–111)
Creatinine: 0.81 mg/dL (ref 0.44–1.00)
GFR, Est AFR Am: >60 mL/min (ref >60)
GFR, Estimated: >60 mL/min (ref >60)
Glucose, Bld: 74 mg/dL (ref 70–99)
Potassium: 3.9 mmol/L (ref 3.5–5.1)
Sodium: 141 mmol/L (ref 135–145)
Total Bilirubin: 1 mg/dL (ref 0.3–1.2)
Total Protein: 6.8 g/dL (ref 6.5–8.1)

## 2017-11-08 LAB — CBC WITH DIFFERENTIAL (CANCER CENTER ONLY)
Basophils Absolute: 0 10*3/uL (ref 0.0–0.1)
Basophils Relative: 1 %
Eosinophils Absolute: 0 10*3/uL (ref 0.0–0.5)
Eosinophils Relative: 1 %
HCT: 37.9 % (ref 34.8–46.6)
Hemoglobin: 12.8 g/dL (ref 11.6–15.9)
Lymphocytes Relative: 38 %
Lymphs Abs: 1.4 10*3/uL (ref 0.9–3.3)
MCH: 35.4 pg — ABNORMAL HIGH (ref 25.1–34.0)
MCHC: 33.9 g/dL (ref 31.5–36.0)
MCV: 104.6 fL — ABNORMAL HIGH (ref 79.5–101.0)
Monocytes Absolute: 0.2 10*3/uL (ref 0.1–0.9)
Monocytes Relative: 7 %
Neutro Abs: 1.9 10*3/uL (ref 1.5–6.5)
Neutrophils Relative %: 53 %
Platelet Count: 214 10*3/uL (ref 145–400)
RBC: 3.62 MIL/uL — ABNORMAL LOW (ref 3.70–5.45)
RDW: 16 % — ABNORMAL HIGH (ref 11.2–14.5)
WBC Count: 3.6 10*3/uL — ABNORMAL LOW (ref 3.9–10.3)

## 2017-11-08 MED ORDER — DENOSUMAB 120 MG/1.7ML ~~LOC~~ SOLN
120.0000 mg | Freq: Once | SUBCUTANEOUS | Status: AC
  Administered 2017-11-08: 120 mg via SUBCUTANEOUS

## 2017-11-08 MED ORDER — FULVESTRANT 250 MG/5ML IM SOLN
500.0000 mg | Freq: Once | INTRAMUSCULAR | Status: AC
  Administered 2017-11-08: 500 mg via INTRAMUSCULAR

## 2017-11-08 MED ORDER — FULVESTRANT 250 MG/5ML IM SOLN
INTRAMUSCULAR | Status: AC
  Filled 2017-11-08: qty 5

## 2017-11-08 MED ORDER — DENOSUMAB 120 MG/1.7ML ~~LOC~~ SOLN
SUBCUTANEOUS | Status: AC
  Filled 2017-11-08: qty 1.7

## 2017-11-08 NOTE — Patient Instructions (Signed)
Fulvestrant injection What is this medicine? FULVESTRANT (ful VES trant) blocks the effects of estrogen. It is used to treat breast cancer. This medicine may be used for other purposes; ask your health care provider or pharmacist if you have questions. COMMON BRAND NAME(S): FASLODEX What should I tell my health care provider before I take this medicine? They need to know if you have any of these conditions: -bleeding problems -liver disease -low levels of platelets in the blood -an unusual or allergic reaction to fulvestrant, other medicines, foods, dyes, or preservatives -pregnant or trying to get pregnant -breast-feeding How should I use this medicine? This medicine is for injection into a muscle. It is usually given by a health care professional in a hospital or clinic setting. Talk to your pediatrician regarding the use of this medicine in children. Special care may be needed. Overdosage: If you think you have taken too much of this medicine contact a poison control center or emergency room at once. NOTE: This medicine is only for you. Do not share this medicine with others. What if I miss a dose? It is important not to miss your dose. Call your doctor or health care professional if you are unable to keep an appointment. What may interact with this medicine? -medicines that treat or prevent blood clots like warfarin, enoxaparin, and dalteparin This list may not describe all possible interactions. Give your health care provider a list of all the medicines, herbs, non-prescription drugs, or dietary supplements you use. Also tell them if you smoke, drink alcohol, or use illegal drugs. Some items may interact with your medicine. What should I watch for while using this medicine? Your condition will be monitored carefully while you are receiving this medicine. You will need important blood work done while you are taking this medicine. Do not become pregnant while taking this medicine or for  at least 1 year after stopping it. Women of child-bearing potential will need to have a negative pregnancy test before starting this medicine. Women should inform their doctor if they wish to become pregnant or think they might be pregnant. There is a potential for serious side effects to an unborn child. Men should inform their doctors if they wish to father a child. This medicine may lower sperm counts. Talk to your health care professional or pharmacist for more information. Do not breast-feed an infant while taking this medicine or for 1 year after the last dose. What side effects may I notice from receiving this medicine? Side effects that you should report to your doctor or health care professional as soon as possible: -allergic reactions like skin rash, itching or hives, swelling of the face, lips, or tongue -feeling faint or lightheaded, falls -pain, tingling, numbness, or weakness in the legs -signs and symptoms of infection like fever or chills; cough; flu-like symptoms; sore throat -vaginal bleeding Side effects that usually do not require medical attention (report to your doctor or health care professional if they continue or are bothersome): -aches, pains -constipation -diarrhea -headache -hot flashes -nausea, vomiting -pain at site where injected -stomach pain This list may not describe all possible side effects. Call your doctor for medical advice about side effects. You may report side effects to FDA at 1-800-FDA-1088. Where should I keep my medicine? This drug is given in a hospital or clinic and will not be stored at home. NOTE: This sheet is a summary. It may not cover all possible information. If you have questions about this medicine, talk to your   doctor, pharmacist, or health care provider.  2017 Elsevier/Gold Standard (2014-11-28 11:03:55) Denosumab injection What is this medicine? DENOSUMAB (den oh sue mab) slows bone breakdown. Prolia is used to treat osteoporosis in  women after menopause and in men. Delton See is used to treat a high calcium level due to cancer and to prevent bone fractures and other bone problems caused by multiple myeloma or cancer bone metastases. Delton See is also used to treat giant cell tumor of the bone. This medicine may be used for other purposes; ask your health care provider or pharmacist if you have questions. COMMON BRAND NAME(S): Prolia, XGEVA What should I tell my health care provider before I take this medicine? They need to know if you have any of these conditions: -dental disease -having surgery or tooth extraction -infection -kidney disease -low levels of calcium or Vitamin D in the blood -malnutrition -on hemodialysis -skin conditions or sensitivity -thyroid or parathyroid disease -an unusual reaction to denosumab, other medicines, foods, dyes, or preservatives -pregnant or trying to get pregnant -breast-feeding How should I use this medicine? This medicine is for injection under the skin. It is given by a health care professional in a hospital or clinic setting. If you are getting Prolia, a special MedGuide will be given to you by the pharmacist with each prescription and refill. Be sure to read this information carefully each time. For Prolia, talk to your pediatrician regarding the use of this medicine in children. Special care may be needed. For Delton See, talk to your pediatrician regarding the use of this medicine in children. While this drug may be prescribed for children as young as 13 years for selected conditions, precautions do apply. Overdosage: If you think you have taken too much of this medicine contact a poison control center or emergency room at once. NOTE: This medicine is only for you. Do not share this medicine with others. What if I miss a dose? It is important not to miss your dose. Call your doctor or health care professional if you are unable to keep an appointment. What may interact with this  medicine? Do not take this medicine with any of the following medications: -other medicines containing denosumab This medicine may also interact with the following medications: -medicines that lower your chance of fighting infection -steroid medicines like prednisone or cortisone This list may not describe all possible interactions. Give your health care provider a list of all the medicines, herbs, non-prescription drugs, or dietary supplements you use. Also tell them if you smoke, drink alcohol, or use illegal drugs. Some items may interact with your medicine. What should I watch for while using this medicine? Visit your doctor or health care professional for regular checks on your progress. Your doctor or health care professional may order blood tests and other tests to see how you are doing. Call your doctor or health care professional for advice if you get a fever, chills or sore throat, or other symptoms of a cold or flu. Do not treat yourself. This drug may decrease your body's ability to fight infection. Try to avoid being around people who are sick. You should make sure you get enough calcium and vitamin D while you are taking this medicine, unless your doctor tells you not to. Discuss the foods you eat and the vitamins you take with your health care professional. See your dentist regularly. Brush and floss your teeth as directed. Before you have any dental work done, tell your dentist you are receiving this medicine. Do  not become pregnant while taking this medicine or for 5 months after stopping it. Talk with your doctor or health care professional about your birth control options while taking this medicine. Women should inform their doctor if they wish to become pregnant or think they might be pregnant. There is a potential for serious side effects to an unborn child. Talk to your health care professional or pharmacist for more information. What side effects may I notice from receiving this  medicine? Side effects that you should report to your doctor or health care professional as soon as possible: -allergic reactions like skin rash, itching or hives, swelling of the face, lips, or tongue -bone pain -breathing problems -dizziness -jaw pain, especially after dental work -redness, blistering, peeling of the skin -signs and symptoms of infection like fever or chills; cough; sore throat; pain or trouble passing urine -signs of low calcium like fast heartbeat, muscle cramps or muscle pain; pain, tingling, numbness in the hands or feet; seizures -unusual bleeding or bruising -unusually weak or tired Side effects that usually do not require medical attention (report to your doctor or health care professional if they continue or are bothersome): -constipation -diarrhea -headache -joint pain -loss of appetite -muscle pain -runny nose -tiredness -upset stomach This list may not describe all possible side effects. Call your doctor for medical advice about side effects. You may report side effects to FDA at 1-800-FDA-1088. Where should I keep my medicine? This medicine is only given in a clinic, doctor's office, or other health care setting and will not be stored at home. NOTE: This sheet is a summary. It may not cover all possible information. If you have questions about this medicine, talk to your doctor, pharmacist, or health care provider.  2018 Elsevier/Gold Standard (2016-05-24 19:17:21) Denosumab injection What is this medicine? DENOSUMAB (den oh sue mab) slows bone breakdown. Prolia is used to treat osteoporosis in women after menopause and in men. Delton See is used to treat a high calcium level due to cancer and to prevent bone fractures and other bone problems caused by multiple myeloma or cancer bone metastases. Delton See is also used to treat giant cell tumor of the bone. This medicine may be used for other purposes; ask your health care provider or pharmacist if you have  questions. COMMON BRAND NAME(S): Prolia, XGEVA What should I tell my health care provider before I take this medicine? They need to know if you have any of these conditions: -dental disease -having surgery or tooth extraction -infection -kidney disease -low levels of calcium or Vitamin D in the blood -malnutrition -on hemodialysis -skin conditions or sensitivity -thyroid or parathyroid disease -an unusual reaction to denosumab, other medicines, foods, dyes, or preservatives -pregnant or trying to get pregnant -breast-feeding How should I use this medicine? This medicine is for injection under the skin. It is given by a health care professional in a hospital or clinic setting. If you are getting Prolia, a special MedGuide will be given to you by the pharmacist with each prescription and refill. Be sure to read this information carefully each time. For Prolia, talk to your pediatrician regarding the use of this medicine in children. Special care may be needed. For Delton See, talk to your pediatrician regarding the use of this medicine in children. While this drug may be prescribed for children as young as 13 years for selected conditions, precautions do apply. Overdosage: If you think you have taken too much of this medicine contact a poison control center or emergency  room at once. NOTE: This medicine is only for you. Do not share this medicine with others. What if I miss a dose? It is important not to miss your dose. Call your doctor or health care professional if you are unable to keep an appointment. What may interact with this medicine? Do not take this medicine with any of the following medications: -other medicines containing denosumab This medicine may also interact with the following medications: -medicines that lower your chance of fighting infection -steroid medicines like prednisone or cortisone This list may not describe all possible interactions. Give your health care provider a  list of all the medicines, herbs, non-prescription drugs, or dietary supplements you use. Also tell them if you smoke, drink alcohol, or use illegal drugs. Some items may interact with your medicine. What should I watch for while using this medicine? Visit your doctor or health care professional for regular checks on your progress. Your doctor or health care professional may order blood tests and other tests to see how you are doing. Call your doctor or health care professional for advice if you get a fever, chills or sore throat, or other symptoms of a cold or flu. Do not treat yourself. This drug may decrease your body's ability to fight infection. Try to avoid being around people who are sick. You should make sure you get enough calcium and vitamin D while you are taking this medicine, unless your doctor tells you not to. Discuss the foods you eat and the vitamins you take with your health care professional. See your dentist regularly. Brush and floss your teeth as directed. Before you have any dental work done, tell your dentist you are receiving this medicine. Do not become pregnant while taking this medicine or for 5 months after stopping it. Talk with your doctor or health care professional about your birth control options while taking this medicine. Women should inform their doctor if they wish to become pregnant or think they might be pregnant. There is a potential for serious side effects to an unborn child. Talk to your health care professional or pharmacist for more information. What side effects may I notice from receiving this medicine? Side effects that you should report to your doctor or health care professional as soon as possible: -allergic reactions like skin rash, itching or hives, swelling of the face, lips, or tongue -bone pain -breathing problems -dizziness -jaw pain, especially after dental work -redness, blistering, peeling of the skin -signs and symptoms of infection like  fever or chills; cough; sore throat; pain or trouble passing urine -signs of low calcium like fast heartbeat, muscle cramps or muscle pain; pain, tingling, numbness in the hands or feet; seizures -unusual bleeding or bruising -unusually weak or tired Side effects that usually do not require medical attention (report to your doctor or health care professional if they continue or are bothersome): -constipation -diarrhea -headache -joint pain -loss of appetite -muscle pain -runny nose -tiredness -upset stomach This list may not describe all possible side effects. Call your doctor for medical advice about side effects. You may report side effects to FDA at 1-800-FDA-1088. Where should I keep my medicine? This medicine is only given in a clinic, doctor's office, or other health care setting and will not be stored at home. NOTE: This sheet is a summary. It may not cover all possible information. If you have questions about this medicine, talk to your doctor, pharmacist, or health care provider.  2018 Elsevier/Gold Standard (2016-05-24 19:17:21)

## 2017-11-08 NOTE — Progress Notes (Signed)
Patient Care Team: [name], MD as PCP - General (Family Medicine)  DIAGNOSIS:  Encounter Diagnosis  Name Primary?  . 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 BRCA reportedly normal.      08/25/2015 Relapse/Recurrence    new solitary liver lesion, biopsy showing metastatic ER PR + breast      09/25/2015 - 03/11/2016 Chemotherapy    Xeloda. Stopped due to progression of liver and bone metastases; pleural metastases      03/31/2016 -  Anti-estrogen oral therapy    Faslodex plus Ibrance (along with Xgeva)       Carcinoma of breast metastatic to bone ([place])   03/27/2016 Initial Diagnosis    Carcinoma of breast metastatic to bone ([place])      08/03/2017 -  Chemotherapy    The patient had fulvestrant (FASLODEX) injection 500 mg, 500 mg, Intramuscular,  Once, 4 of 7 cycles Administration: 500 mg (08/03/2017), 500 mg (08/31/2017)  for chemotherapy treatment.        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  She appears to be tolerating the treatment extremely well.  She continues to have fatigue related to [facility] as well as mild neutropenia.  She gets Xgeva for bone metastases every 3 months.  REVIEW OF SYSTEMS:   Constitutional: Denies fevers, chills or abnormal weight loss Eyes: Denies blurriness of vision Ears, nose, mouth, throat, and face: Denies mucositis or sore throat Respiratory: Denies cough, dyspnea or wheezes Cardiovascular: Denies palpitation, chest discomfort Gastrointestinal:  Denies nausea, heartburn or change in bowel habits Skin: Denies abnormal skin rashes Lymphatics: Denies new lymphadenopathy or easy bruising Neurological:Denies numbness, tingling or new weaknesses Behavioral/Psych: Mood is stable, no new changes  Extremities: No lower extremity edema All other systems were reviewed with the patient and are negative.  I have reviewed the past medical history, past surgical history, social history and family history with the patient and they are unchanged from previous note.  ALLERGIES:  is allergic to sulfa antibiotics.  MEDICATIONS:  Current Outpatient Medications  Medication Sig Dispense Refill  . benzonatate (TESSALON) 100 MG capsule Take 1 capsule (100 mg total) by mouth every 8 (eight) hours as needed for cough. 30 capsule 0  . Calcium Carb-Cholecalciferol (CALCIUM 600+D3) 600-800 MG-UNIT TABS Take 1 tablet by mouth 2 (two) times daily.    . Cholecalciferol (VITAMIN D3) 2000 UNITS TABS Take 2,000 mg by mouth daily.    . ferrous sulfate 325 (65 FE) MG tablet Take 325 mg by mouth daily with breakfast.    .  fish oil-omega-3 fatty acids 1000 MG capsule Take 1 g by mouth 2 (two) times daily.     . furosemide (LASIX) 40 MG tablet Take 40 mg by mouth daily as needed for fluid or edema. Reported on 05/07/2015    . glimepiride (AMARYL) 2 MG tablet Take 2 mg by mouth daily with breakfast.    . IBRANCE 125 MG capsule TAKE 1 CAPSULE (125 MG TOTAL)  BY MOUTH DAILY WITH BREAKFAST. TAKE FOR 21 DAYS ON, 7 DAYS OFF. TAKE WHOLE WITH FOOD. 21 capsule 3  . ibuprofen (ADVIL,MOTRIN) 200 MG tablet Take 400 mg by mouth every 6 (six) hours as needed for headache (pain). Reported on 11/12/2015    . letrozole (FEMARA) 2.5 MG tablet Take 1 tablet (2.5 mg total) by mouth daily. 90 tablet 3  . metFORMIN (GLUCOPHAGE) 500 MG tablet Take 500 mg by mouth 2 (two) times daily with a meal.      . nabumetone (RELAFEN) 750 MG tablet     . nystatin (MYCOSTATIN) powder Apply to right groin 2-3 times a day 30 g 0  . ondansetron (ZOFRAN) 8 MG tablet Take 1 tablet (8 mg total) by mouth every 8 (eight) hours as needed for nausea or vomiting. (Patient not taking: Reported on 05/26/2016) 20 tablet 1  . simvastatin (ZOCOR) 40 MG tablet Take 40 mg by mouth at bedtime.       No current facility-administered medications for this visit.     PHYSICAL EXAMINATION: ECOG PERFORMANCE STATUS: 1 - Symptomatic but completely ambulatory  Vitals:   11/08/17 1411  BP: (!) 152/72  Pulse: 86  Resp: 17  Temp: 98.4 F (36.9 C)  SpO2: 98%   Filed Weights   11/08/17 1411  Weight: 224 lb 14.4 oz (102 kg)    GENERAL:alert, no distress and comfortable SKIN: skin color, texture, turgor are normal, no rashes or significant lesions EYES: normal, Conjunctiva are pink and non-injected, sclera clear OROPHARYNX:no exudate, no erythema and lips, buccal mucosa, and tongue normal  NECK: supple, thyroid normal size, non-tender, without nodularity LYMPH:  no palpable lymphadenopathy in the cervical, axillary or inguinal LUNGS: clear to auscultation and percussion with normal breathing effort HEART: regular rate & rhythm and no murmurs and no lower extremity edema ABDOMEN:abdomen soft, non-tender and normal bowel sounds MUSCULOSKELETAL:no cyanosis of digits and no clubbing  NEURO: alert & oriented x 3 with fluent speech, no focal motor/sensory deficits EXTREMITIES: No lower extremity  edema   LABORATORY DATA:  I have reviewed the data as listed CMP Latest Ref Rng & Units 09/28/2017 08/31/2017 08/03/2017  Glucose 70 - 140 mg/dL 304(H) 168(H) 201(H)  BUN 7 - 26 mg/dL '15 17 15  ' Creatinine 0.60 - 1.10 mg/dL 0.80 0.77 0.78  Sodium 136 - 145 mmol/L 142 143 143  Potassium 3.5 - 5.1 mmol/L 4.5 5.0 4.6  Chloride 98 - 109 mmol/L 105 106 106  CO2 22 - 29 mmol/L 28 30(H) 27  Calcium 8.4 - 10.4 mg/dL 8.3(L) 9.1 8.8  Total Protein 6.4 - 8.3 g/dL 6.3(L) 6.5 6.3(L)  Total Bilirubin 0.2 - 1.2 mg/dL 0.7 0.8 0.9  Alkaline Phos 40 - 150 U/L 50 44 45  AST 5 - 34 U/L 32 20 23  ALT 0 - 55 U/L '21 14 13    ' Lab Results  Component Value Date   WBC 3.6 (L) 11/08/2017   HGB 12.8 11/08/2017   HCT 37.9 11/08/2017   MCV 104.6 (H) 11/08/2017   PLT 214 11/08/2017  NEUTROABS 1.9 11/08/2017    ASSESSMENT & PLAN:  Carcinoma of breast metastatic to bone [facility]) Metastatic breast cancer: recently progressive in liver and bone, also pleura. OnFaslodex + Ibrance beginning 03/31/2016  Toxicities with therapy: 1. Neutropenia: Mild does not require dose adjustment. 2. injection site discomfort very mild 3. Fatigue related to Ibrance  CT chest abdomen pelvis for restaging 07/20/2016: Stable left pleural metastases, right liver lobe metastases unchanged, multiple bone metastases slightly progressive from previous  PET CT scan will be performed in 3 months Xgeva injections every 3 months. Follow-up after that with labs      No orders of the defined types were placed in this encounter.  The patient has a good understanding of the overall plan. she agrees with it. she will call with any problems that may develop before the next visit here.   Harriette Ohara, MD 11/08/17

## 2017-11-08 NOTE — Assessment & Plan Note (Signed)
Metastatic breast cancer: recently progressive in liver and bone, also pleura. OnFaslodex + Ibrance beginning 03/31/2016  Toxicities with therapy: 1. Neutropenia: Mild does not require dose adjustment. 2. injection site discomfort very mild 3. Fatigue related to Ibrance  CT chest abdomen pelvis for restaging 07/20/2016: Stable left pleural metastases, right liver lobe metastases unchanged, multiple bone metastases slightly progressive from previous  PET CT scan will be performed in 3 months Xgeva injections every 3 months. Follow-up after that with labs

## 2017-11-08 NOTE — Telephone Encounter (Signed)
Gave avs and calendar ° °

## 2017-11-08 NOTE — Telephone Encounter (Signed)
Order placed per Dr Lindi Adie for PET scan to be done in 3 months.  I sent an inbasket to Darlena in managed care regarding prior auth and then I will schedule the PET scan and contact pt.

## 2017-11-08 NOTE — Progress Notes (Signed)
For the purpose of clarification, pt has copay assistance w/ [employer] and they will actually cover Xgeva as well as Faslodex and it will leave pt with a zero copay until 05/15/18.  [employer] has already paid on 3 invoices for Faslodex and Xgeva.

## 2017-11-15 MED FILL — IBRANCE 125 MG CAPSULE: 125 | 28 days supply | Qty: 21 | Fill #2

## 2017-11-28 ENCOUNTER — Telehealth: Payer: Self-pay

## 2017-11-28 NOTE — Telephone Encounter (Signed)
Called pt to let her know that her PA for pet scans have been authorized and provided her with scheduling number to set up appt for scans in September. Pt verbalized phone number and confirmed appt with Dr.Gudena on Sept 26.

## 2017-12-07 ENCOUNTER — Inpatient Hospital Stay: Payer: Medicare Other | Attending: Hematology and Oncology

## 2017-12-07 DIAGNOSIS — C787 Secondary malignant neoplasm of liver and intrahepatic bile duct: Secondary | ICD-10-CM | POA: Diagnosis not present

## 2017-12-07 DIAGNOSIS — Z5111 Encounter for antineoplastic chemotherapy: Secondary | ICD-10-CM | POA: Insufficient documentation

## 2017-12-07 DIAGNOSIS — C50919 Malignant neoplasm of unspecified site of unspecified female breast: Secondary | ICD-10-CM | POA: Insufficient documentation

## 2017-12-07 DIAGNOSIS — C782 Secondary malignant neoplasm of pleura: Secondary | ICD-10-CM | POA: Insufficient documentation

## 2017-12-07 DIAGNOSIS — C7951 Secondary malignant neoplasm of bone: Secondary | ICD-10-CM | POA: Insufficient documentation

## 2017-12-07 MED ORDER — FULVESTRANT 250 MG/5ML IM SOLN
500.0000 mg | Freq: Once | INTRAMUSCULAR | Status: AC
  Administered 2017-12-07: 500 mg via INTRAMUSCULAR

## 2017-12-07 NOTE — Patient Instructions (Signed)
Fulvestrant injection What is this medicine? FULVESTRANT (ful VES trant) blocks the effects of estrogen. It is used to treat breast cancer. This medicine may be used for other purposes; ask your health care provider or pharmacist if you have questions. COMMON BRAND NAME(S): FASLODEX What should I tell my health care provider before I take this medicine? They need to know if you have any of these conditions: -bleeding problems -liver disease -low levels of platelets in the blood -an unusual or allergic reaction to fulvestrant, other medicines, foods, dyes, or preservatives -pregnant or trying to get pregnant -breast-feeding How should I use this medicine? This medicine is for injection into a muscle. It is usually given by a health care professional in a hospital or clinic setting. Talk to your pediatrician regarding the use of this medicine in children. Special care may be needed. Overdosage: If you think you have taken too much of this medicine contact a poison control center or emergency room at once. NOTE: This medicine is only for you. Do not share this medicine with others. What if I miss a dose? It is important not to miss your dose. Call your doctor or health care professional if you are unable to keep an appointment. What may interact with this medicine? -medicines that treat or prevent blood clots like warfarin, enoxaparin, and dalteparin This list may not describe all possible interactions. Give your health care provider a list of all the medicines, herbs, non-prescription drugs, or dietary supplements you use. Also tell them if you smoke, drink alcohol, or use illegal drugs. Some items may interact with your medicine. What should I watch for while using this medicine? Your condition will be monitored carefully while you are receiving this medicine. You will need important blood work done while you are taking this medicine. Do not become pregnant while taking this medicine or for  at least 1 year after stopping it. Women of child-bearing potential will need to have a negative pregnancy test before starting this medicine. Women should inform their doctor if they wish to become pregnant or think they might be pregnant. There is a potential for serious side effects to an unborn child. Men should inform their doctors if they wish to father a child. This medicine may lower sperm counts. Talk to your health care professional or pharmacist for more information. Do not breast-feed an infant while taking this medicine or for 1 year after the last dose. What side effects may I notice from receiving this medicine? Side effects that you should report to your doctor or health care professional as soon as possible: -allergic reactions like skin rash, itching or hives, swelling of the face, lips, or tongue -feeling faint or lightheaded, falls -pain, tingling, numbness, or weakness in the legs -signs and symptoms of infection like fever or chills; cough; flu-like symptoms; sore throat -vaginal bleeding Side effects that usually do not require medical attention (report to your doctor or health care professional if they continue or are bothersome): -aches, pains -constipation -diarrhea -headache -hot flashes -nausea, vomiting -pain at site where injected -stomach pain This list may not describe all possible side effects. Call your doctor for medical advice about side effects. You may report side effects to FDA at 1-800-FDA-1088. Where should I keep my medicine? This drug is given in a hospital or clinic and will not be stored at home. NOTE: This sheet is a summary. It may not cover all possible information. If you have questions about this medicine, talk to your   doctor, pharmacist, or health care provider.  2017 Elsevier/Gold Standard (2014-11-28 11:03:55) Denosumab injection What is this medicine? DENOSUMAB (den oh sue mab) slows bone breakdown. Prolia is used to treat osteoporosis in  women after menopause and in men. Delton See is used to treat a high calcium level due to cancer and to prevent bone fractures and other bone problems caused by multiple myeloma or cancer bone metastases. Delton See is also used to treat giant cell tumor of the bone. This medicine may be used for other purposes; ask your health care provider or pharmacist if you have questions. COMMON BRAND NAME(S): Prolia, XGEVA What should I tell my health care provider before I take this medicine? They need to know if you have any of these conditions: -dental disease -having surgery or tooth extraction -infection -kidney disease -low levels of calcium or Vitamin D in the blood -malnutrition -on hemodialysis -skin conditions or sensitivity -thyroid or parathyroid disease -an unusual reaction to denosumab, other medicines, foods, dyes, or preservatives -pregnant or trying to get pregnant -breast-feeding How should I use this medicine? This medicine is for injection under the skin. It is given by a health care professional in a hospital or clinic setting. If you are getting Prolia, a special MedGuide will be given to you by the pharmacist with each prescription and refill. Be sure to read this information carefully each time. For Prolia, talk to your pediatrician regarding the use of this medicine in children. Special care may be needed. For Delton See, talk to your pediatrician regarding the use of this medicine in children. While this drug may be prescribed for children as young as 13 years for selected conditions, precautions do apply. Overdosage: If you think you have taken too much of this medicine contact a poison control center or emergency room at once. NOTE: This medicine is only for you. Do not share this medicine with others. What if I miss a dose? It is important not to miss your dose. Call your doctor or health care professional if you are unable to keep an appointment. What may interact with this  medicine? Do not take this medicine with any of the following medications: -other medicines containing denosumab This medicine may also interact with the following medications: -medicines that lower your chance of fighting infection -steroid medicines like prednisone or cortisone This list may not describe all possible interactions. Give your health care provider a list of all the medicines, herbs, non-prescription drugs, or dietary supplements you use. Also tell them if you smoke, drink alcohol, or use illegal drugs. Some items may interact with your medicine. What should I watch for while using this medicine? Visit your doctor or health care professional for regular checks on your progress. Your doctor or health care professional may order blood tests and other tests to see how you are doing. Call your doctor or health care professional for advice if you get a fever, chills or sore throat, or other symptoms of a cold or flu. Do not treat yourself. This drug may decrease your body's ability to fight infection. Try to avoid being around people who are sick. You should make sure you get enough calcium and vitamin D while you are taking this medicine, unless your doctor tells you not to. Discuss the foods you eat and the vitamins you take with your health care professional. See your dentist regularly. Brush and floss your teeth as directed. Before you have any dental work done, tell your dentist you are receiving this medicine. Do  not become pregnant while taking this medicine or for 5 months after stopping it. Talk with your doctor or health care professional about your birth control options while taking this medicine. Women should inform their doctor if they wish to become pregnant or think they might be pregnant. There is a potential for serious side effects to an unborn child. Talk to your health care professional or pharmacist for more information. What side effects may I notice from receiving this  medicine? Side effects that you should report to your doctor or health care professional as soon as possible: -allergic reactions like skin rash, itching or hives, swelling of the face, lips, or tongue -bone pain -breathing problems -dizziness -jaw pain, especially after dental work -redness, blistering, peeling of the skin -signs and symptoms of infection like fever or chills; cough; sore throat; pain or trouble passing urine -signs of low calcium like fast heartbeat, muscle cramps or muscle pain; pain, tingling, numbness in the hands or feet; seizures -unusual bleeding or bruising -unusually weak or tired Side effects that usually do not require medical attention (report to your doctor or health care professional if they continue or are bothersome): -constipation -diarrhea -headache -joint pain -loss of appetite -muscle pain -runny nose -tiredness -upset stomach This list may not describe all possible side effects. Call your doctor for medical advice about side effects. You may report side effects to FDA at 1-800-FDA-1088. Where should I keep my medicine? This medicine is only given in a clinic, doctor's office, or other health care setting and will not be stored at home. NOTE: This sheet is a summary. It may not cover all possible information. If you have questions about this medicine, talk to your doctor, pharmacist, or health care provider.  2018 Elsevier/Gold Standard (2016-05-24 19:17:21)  

## 2017-12-14 MED FILL — IBRANCE 125 MG CAPSULE: 125 | 28 days supply | Qty: 21 | Fill #3

## 2018-01-04 ENCOUNTER — Inpatient Hospital Stay: Payer: Medicare Other | Attending: Hematology and Oncology

## 2018-01-04 VITALS — BP 133/107 | HR 78 | Temp 98.3°F | Resp 20

## 2018-01-04 DIAGNOSIS — C7951 Secondary malignant neoplasm of bone: Secondary | ICD-10-CM | POA: Insufficient documentation

## 2018-01-04 DIAGNOSIS — C782 Secondary malignant neoplasm of pleura: Secondary | ICD-10-CM | POA: Insufficient documentation

## 2018-01-04 DIAGNOSIS — Z5111 Encounter for antineoplastic chemotherapy: Secondary | ICD-10-CM | POA: Diagnosis not present

## 2018-01-04 DIAGNOSIS — C50919 Malignant neoplasm of unspecified site of unspecified female breast: Secondary | ICD-10-CM | POA: Insufficient documentation

## 2018-01-04 DIAGNOSIS — C787 Secondary malignant neoplasm of liver and intrahepatic bile duct: Secondary | ICD-10-CM | POA: Insufficient documentation

## 2018-01-04 MED ORDER — FULVESTRANT 250 MG/5ML IM SOLN
INTRAMUSCULAR | Status: AC
Start: 2018-01-04 — End: ?
  Filled 2018-01-04: qty 10

## 2018-01-04 MED ORDER — FULVESTRANT 250 MG/5ML IM SOLN
500.0000 mg | Freq: Once | INTRAMUSCULAR | Status: AC
  Administered 2018-01-04: 500 mg via INTRAMUSCULAR

## 2018-01-05 ENCOUNTER — Other Ambulatory Visit: Payer: Self-pay | Admitting: Hematology and Oncology

## 2018-01-05 DIAGNOSIS — C50919 Malignant neoplasm of unspecified site of unspecified female breast: Secondary | ICD-10-CM

## 2018-01-09 ENCOUNTER — Emergency Department (HOSPITAL_COMMUNITY)
Admission: EM | Admit: 2018-01-09 | Discharge: 2018-01-09 | Disposition: A | Discharge status: Home / Self Care | Payer: Medicare Other | Attending: Emergency Medicine | Admitting: Emergency Medicine

## 2018-01-09 ENCOUNTER — Emergency Department (HOSPITAL_COMMUNITY): Payer: Medicare Other

## 2018-01-09 ENCOUNTER — Other Ambulatory Visit: Payer: Self-pay

## 2018-01-09 ENCOUNTER — Encounter (HOSPITAL_COMMUNITY): Payer: Self-pay | Admitting: Emergency Medicine

## 2018-01-09 DIAGNOSIS — E119 Type 2 diabetes mellitus without complications: Secondary | ICD-10-CM | POA: Insufficient documentation

## 2018-01-09 DIAGNOSIS — Z79899 Other long term (current) drug therapy: Secondary | ICD-10-CM | POA: Diagnosis not present

## 2018-01-09 DIAGNOSIS — Z7984 Long term (current) use of oral hypoglycemic drugs: Secondary | ICD-10-CM | POA: Diagnosis not present

## 2018-01-09 DIAGNOSIS — I4892 Unspecified atrial flutter: Secondary | ICD-10-CM | POA: Diagnosis not present

## 2018-01-09 DIAGNOSIS — I483 Typical atrial flutter: Secondary | ICD-10-CM | POA: Diagnosis not present

## 2018-01-09 DIAGNOSIS — R000 Tachycardia, unspecified: Secondary | ICD-10-CM | POA: Diagnosis present

## 2018-01-09 LAB — CBC
HCT: 36.5 % (ref 36.0–46.0)
Hemoglobin: 11.7 g/dL — ABNORMAL LOW (ref 12.0–15.0)
MCH: 35.3 pg — ABNORMAL HIGH (ref 26.0–34.0)
MCHC: 32.1 g/dL (ref 30.0–36.0)
MCV: 110.3 fL — ABNORMAL HIGH (ref 78.0–100.0)
Platelets: 139 10*3/uL — ABNORMAL LOW (ref 150–400)
RBC: 3.31 MIL/uL — ABNORMAL LOW (ref 3.87–5.11)
RDW: 14.6 % (ref 11.5–15.5)
WBC: 3.4 10*3/uL — ABNORMAL LOW (ref 4.0–10.5)

## 2018-01-09 LAB — BASIC METABOLIC PANEL
Anion gap: 9 (ref 5–15)
BUN: 35 mg/dL — ABNORMAL HIGH (ref 8–23)
CO2: 27 mmol/L (ref 22–32)
Calcium: 8.6 mg/dL — ABNORMAL LOW (ref 8.9–10.3)
Chloride: 106 mmol/L (ref 98–111)
Creatinine, Ser: 1.19 mg/dL — ABNORMAL HIGH (ref 0.44–1.00)
GFR calc Af Amer: 51 mL/min — ABNORMAL LOW (ref >60)
GFR calc non Af Amer: 44 mL/min — ABNORMAL LOW (ref >60)
Glucose, Bld: 220 mg/dL — ABNORMAL HIGH (ref 70–99)
Potassium: 5.2 mmol/L — ABNORMAL HIGH (ref 3.5–5.1)
Sodium: 142 mmol/L (ref 135–145)

## 2018-01-09 LAB — I-STAT TROPONIN, ED: Troponin i, poc: 0.04 ng/mL (ref 0.00–0.08)

## 2018-01-09 LAB — TSH: TSH: 3.797 u[IU]/mL (ref 0.350–4.500)

## 2018-01-09 LAB — MAGNESIUM: Magnesium: 1.2 mg/dL — ABNORMAL LOW (ref 1.7–2.4)

## 2018-01-09 MED ORDER — ADENOSINE 6 MG/2ML IV SOLN
6.0000 mg | Freq: Once | INTRAVENOUS | Status: AC
  Administered 2018-01-09: 6 mg via INTRAVENOUS
  Filled 2018-01-09: qty 2

## 2018-01-09 MED ORDER — DILTIAZEM HCL ER COATED BEADS 120 MG PO CP24: 120.0000 mg | ORAL_CAPSULE | Freq: Every day | ORAL | 0 refills | Status: DC

## 2018-01-09 MED ORDER — DILTIAZEM HCL-DEXTROSE 100-5 MG/100ML-% IV SOLN (PREMIX)
5.0000 mg/h | INTRAVENOUS | Status: DC
  Administered 2018-01-09: 5 mg/h via INTRAVENOUS
  Filled 2018-01-09: qty 100

## 2018-01-09 MED ORDER — DILTIAZEM LOAD VIA INFUSION
10.0000 mg | Freq: Once | INTRAVENOUS | Status: AC
  Administered 2018-01-09: 10 mg via INTRAVENOUS
  Filled 2018-01-09: qty 10

## 2018-01-09 NOTE — ED Notes (Addendum)
MD Tegeler notified about hypotension in the 70's while on the cardizem drip even after titration back down to 5mg /hr.  Per MD Tegeler, turn off Cardizem drip. Pt not having any complaints and mentating well. Will continue to monitor.

## 2018-01-09 NOTE — ED Notes (Signed)
Discharge instructions and prescriptions discussed with Pt. Pt verbalized understanding. Pt stable and ambulatory.   

## 2018-01-09 NOTE — Consult Note (Addendum)
Cardiology Consultation:   Patient ID: KEELIE ZEMANEK; [id number]; [birth year]   Admit date: 01/09/2018 Date of Consult: 01/09/2018  Primary Care Provider: [name], MD Primary Cardiologist: new to [facility] Primary Electrophysiologist:  New to [facility]   Patient Profile:   Brittany Porter is a 72 y.o. female with a hx of DM and long cancer history, no known cardiac history who is being seen today for the evaluation of rapid AFlutter at the request of Dr. Sherry Ruffing.  History of Present Illness:   Ms. Looney has been feeling well, reports yesterday doing her usual activities, had done some weed-eating outside which she does on/off without any noted change to her usual exertional capacity.  She took her BP as usual last evening her BP was "fine" though her machine told her her HR was 170-.  She took  It a number of times, with similar HR noted.  She thought it was the machine given she had no symptoms.  He husband checked his and his BP and HR were where he expected to find them.  Given she felt well, she did not pursue any evaluation and slept well.  She woke today feeling well, again, no cardiac awareness, checked her BP again and her HR this AM reported 140.  AT the insistence of her daughter (who works here in [place]) she came to the ER because she was unable to get to her PMD until late afternoon.  The patient has a long cancer history, b/l breast cancer s/p b/l mastectomies w/nodes years ago, developed lung and bone mets treated.  Eventually diagnosed with melanoma, had to lesions removed, developed uterine, endometrial and cervical caner requiring large surgical intervention including appendectomy for tumor removal.  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 She follows regularly with Dr. Lindi Adie  Through all of this, she has never had/been aware of any cardiac issues. Even here and when she found her HR fats by her BP machine at  home, she was unaware of ant palpitations, had no CP or SOB.  No dizziness, near syncope or syncope.  She admits that in general she had good/bad days through all her caner treatments/surgeries, but nothing new or different of late.  Past Medical History:  Diagnosis Date  . Arthritis   . Breast cancer ([place])   . Diabetes mellitus   . Endometrial carcinoma ([place]) 10/2008  . Fluid overload   . History of radiation therapy 04/13/2009, 04/16/2009, 04/27/2009, 05/07/2009, 05/18/2009   3000 cGy to proximal vagina  . Hyperlipidemia   . Iron deficiency   . Melanoma ([place]) 2010   rt arm and back  . Ovarian cancer ([place])   . Pleural effusion 04/2010    Past Surgical History:  Procedure Laterality Date  . ABDOMINAL HYSTERECTOMY  10/2008  . INSERTION OF MESH  05/28/2012   Procedure: INSERTION OF MESH;  Surgeon: Adin Hector, MD;  Location: [facility];  Service: General;  Laterality: N/A;  . KNEE ARTHROSCOPY  04/2010   Right knee  . LAPAROSCOPIC LYSIS OF ADHESIONS  05/28/2012   Procedure: LAPAROSCOPIC LYSIS OF ADHESIONS;  Surgeon: Adin Hector, MD;  Location: [facility];  Service: General;  Laterality: N/A;  . MASTECTOMY  1997   Bilateral with lymph nodes  . Melanoma removal  02/2009, 07/2009  . VENTRAL HERNIA REPAIR  05/28/2012   Procedure: LAPAROSCOPIC VENTRAL HERNIA;  Surgeon: Adin Hector, MD;  Location: [facility];  Service: General;  Laterality: N/A;     Home Medications:  Prior to Admission medications   Medication Sig Start Date End Date Taking? Authorizing Provider  Calcium Carb-Cholecalciferol (CALCIUM 600+D3) 600-800 MG-UNIT TABS Take 1 tablet by mouth daily.    Yes [provider]  Cholecalciferol (VITAMIN D3) 2000 UNITS TABS Take 2,000 mg by mouth daily.   Yes [provider]  ferrous sulfate 325 (65 FE) MG tablet Take 325 mg by mouth daily with breakfast.   Yes [provider]  fish oil-omega-3 fatty acids 1000 MG capsule Take 1 g by mouth 2 (two) times daily.     Yes [provider]  furosemide (LASIX) 20 MG tablet Take 20 mg by mouth daily as needed for fluid or edema. Reported on 05/07/2015 10/08/14  Yes [provider]  glimepiride (AMARYL) 2 MG tablet Take 2 mg by mouth daily with breakfast.   Yes [provider]  IBRANCE 125 MG capsule TAKE 1 CAPSULE (125 MG TOTAL) BY MOUTH DAILY WITH BREAKFAST. TAKE FOR 21 DAYS ON, 7 DAYS OFF. TAKE WHOLE WITH FOOD. Patient taking differently: Take 125 mg by mouth daily with breakfast.  01/05/18  Yes Nicholas Lose, MD  ibuprofen (ADVIL,MOTRIN) 200 MG tablet Take 400 mg by mouth every 6 (six) hours as needed for headache (pain). Reported on 11/12/2015   Yes [provider]  letrozole (FEMARA) 2.5 MG tablet Take 1 tablet (2.5 mg total) by mouth daily. 05/02/17  Yes Nicholas Lose, MD  metFORMIN (GLUCOPHAGE) 500 MG tablet Take 500 mg by mouth 2 (two) times daily with a meal.     Yes [provider]  naproxen (NAPROSYN) 500 MG tablet Take 500 mg by mouth 2 (two) times daily as needed. 11/19/17  Yes [provider]  simvastatin (ZOCOR) 40 MG tablet Take 40 mg by mouth at bedtime.     Yes [provider]  benzonatate (TESSALON) 100 MG capsule Take 1 capsule (100 mg total) by mouth every 8 (eight) hours as needed for cough. Patient not taking: Reported on 01/09/2018 04/28/16   Gordy Levan, MD  nystatin (MYCOSTATIN) powder Apply to right groin 2-3 times a day Patient not taking: Reported on 01/09/2018 10/29/15   Gordy Levan, MD  ondansetron (ZOFRAN) 8 MG tablet Take 1 tablet (8 mg total) by mouth every 8 (eight) hours as needed for nausea or vomiting. Patient not taking: Reported on 05/26/2016 10/01/15   Gordy Levan, MD    Inpatient Medications: Scheduled Meds:  Continuous Infusions: . diltiazem (CARDIZEM) infusion 5 mg/hr (01/09/18 1326)   PRN Meds:   Allergies:    Allergies  Allergen Reactions  . Sulfa Antibiotics Swelling    "Eyes swelled  shut" - reaction to eye drops    Social History:   Social History   Socioeconomic History  . Marital status: Married    Spouse name: Not on file  . Number of children: Not on file  . Years of education: Not on file  . Highest education level: Not on file  Occupational History  . Not on file  Social Needs  . Financial resource strain: Not on file  . Food insecurity:    Worry: Not on file    Inability: Not on file  . Transportation needs:    Medical: Not on file    Non-medical: Not on file  Tobacco Use  . Smoking status: Never Smoker  . Smokeless tobacco: Never Used  Substance and Sexual Activity  .  Alcohol use: No  . Drug use: No  . Sexual activity: Yes  Lifestyle  . Physical activity:    Days per week: Not on file    Minutes per session: Not on file  . Stress: Not on file  Relationships  . Social connections:    Talks on phone: Not on file    Gets together: Not on file    Attends religious service: Not on file    Active member of club or organization: Not on file    Attends meetings of clubs or organizations: Not on file    Relationship status: Not on file  . Intimate partner violence:    Fear of current or ex partner: Not on file    Emotionally abused: Not on file    Physically abused: Not on file    Forced sexual activity: Not on file  Other Topics Concern  . Not on file  Social History Narrative  . Not on file    Family History:   Family History  Problem Relation Age of Onset  . Stroke Mother   . Cancer Father   . Colon cancer Sister   . Stroke Sister   . Cancer Sister        breast  . Heart attack Brother   . Cancer Brother        skin  . Skin cancer Brother   . Cancer Sister        colon     ROS:  Please see the history of present illness.  All other ROS reviewed and negative.     Physical Exam/Data:   Vitals:   01/09/18 1245 01/09/18 1300 01/09/18 1324 01/09/18 1330  BP: (!) 123/56 (!) 103/59 93/78 (!) 84/70  Pulse: 75 72 73 (!) 108    Resp: (!) 21 17 16  (!) 24  Temp:      TempSrc:      SpO2: 100% 100% 100% 100%   No intake or output data in the 24 hours ending 01/09/18 1339 There were no vitals filed for this visit. There is no height or weight on file to calculate BMI.  General:  Well nourished, well developed, in no acute distress HEENT: normal Lymph: no adenopathy Neck: no JVD Endocrine:  No thryomegaly Vascular: No carotid bruits Cardiac:  RRR; no murmurs, gallops or rubs are appreciated Lungs:  CTA b/l, no wheezing, rhonchi or rales  Abd: soft, nontender Ext: no edema Musculoskeletal:  No deformities Skin: warm and dry  Neuro:   No gross focal abnormalities noted Psych:  Normal affect   EKG:  The EKG was personally reviewed and demonstrates:   #1 AFlutter 174bpm, QRS 1ms #3 AFlutter 148bpm, baseline artifact, looks possibly atypical #4 SR 76bpm, PACs Telemetry:  Telemetry was personally reviewed and demonstrates:   AFlutter w/RVR >> 130's-140's >> SR PACs  Relevant CV Studies:  No historical cardiac data found  Laboratory Data:  Chemistry Recent Labs  Lab 01/09/18 1011  NA 142  K 5.2*  CL 106  CO2 27  GLUCOSE 220*  BUN 35*  CREATININE 1.19*  CALCIUM 8.6*  GFRNONAA 44*  GFRAA 51*  ANIONGAP 9    No results for input(s): PROT, ALBUMIN, AST, ALT, ALKPHOS, BILITOT in the last 168 hours. Hematology Recent Labs  Lab 01/09/18 1011  WBC 3.4*  RBC 3.31*  HGB 11.7*  HCT 36.5  MCV 110.3*  MCH 35.3*  MCHC 32.1  RDW 14.6  PLT 139*   Cardiac EnzymesNo results for  input(s): TROPONINI in the last 168 hours.  Recent Labs  Lab 01/09/18 1016  TROPIPOC 0.04    BNPNo results for input(s): BNP, PROBNP in the last 168 hours.  DDimer No results for input(s): DDIMER in the last 168 hours.  Radiology/Studies:  Dg Chest Portable 1 View  Result Date: 01/09/2018 CLINICAL DATA:  Tachycardia EXAM: PORTABLE CHEST 1 VIEW COMPARISON:  06/30/2017 FINDINGS: Artifact overlies the chest. Chronic  cardiomegaly. Allowing for the overlying artifact right chest is clear. There is chronic pleural density on the left with chronic volume loss at the left base. No acute bone finding. Surgical clips presumably related to bilateral mastectomy. IMPRESSION: Overlying artifact. Right chest clear. Chronic left pleural density with chronic left base volume loss. Electronically Signed   By: Nelson Chimes M.D.   On: 01/09/2018 11:18    Assessment and Plan:   1. AFlutter      New onset, suspected to have been sometime yesterday, though given completely asymptomatic, hard to know for sure.      Sunday evening when she did her BP her HR was in the 60-70s as usual and last night 170s       She was treated with diltiazem, and has spontaneously converted to SR here in the ER      CHA2DS2Vasc is 3 and has indicatio for full a/c      Slower EKG has artifact, I think her AFlutter is atypical  I anticipate she will be able to go home from the ER with early f/u in the AFib clinic   Dr. Rayann Heman will see, we will discuss anticoagulation in the environment of liver mets/ chemo and likely add on BB   For questions or updates, please contact [place] Please consult [email or web address] for contact info under Cardiology/STEMI.   Signed, Baldwin Jamaica, PA-C  01/09/2018 1:39 PM   I have seen, examined the patient, and reviewed the above assessment and plan.  Changes to above are made where necessary.  On exam, RRR.  She presented with typical appearing atrial flutter.  Currently in sinus rhythm.  Will add diltiazem CD 120mg  daily.  Will not start anticoagulation at this time, though it may be prudent to consider [place] more electively once me have more details about her cancer. Ultimately, could also consider elective ablation.  DC to home  Follow-up in AF clinic.  Co Sign: Thompson Grayer, MD 01/09/2018 3:04 PM

## 2018-01-09 NOTE — Discharge Instructions (Signed)
Please read and follow all provided instructions.  Your diagnoses today include:  1. Atrial flutter with rapid ventricular response (HCC)    Tests performed today include:  Blood counts and electrolytes  Thyroid test  Chest x-ray  Multiple EKG's  Vital signs. See below for your results today.   Medications prescribed:   Cardizem - medication to help control your heart rate  Take any prescribed medications only as directed.  Home care instructions:  Follow any educational materials contained in this packet.  BE VERY CAREFUL not to take multiple medicines containing Tylenol (also called acetaminophen). Doing so can lead to an overdose which can damage your liver and cause liver failure and possibly death.   Follow-up instructions: Please follow-up with the atrial fibrillation clinic as planned in 3 days.   Return instructions:   Please return to the Emergency Department if you experience worsening symptoms.   Please return with chest pain, shortness of breath, if you feel lightheaded or pass out.  Please return if you have any other emergent concerns.  Additional Information:  Your vital signs today were: BP 109/77    Pulse 85    Temp 98.3 F (36.8 C) (Oral)    Resp (!) 31    SpO2 100%  If your blood pressure (BP) was elevated above 135/85 this visit, please have this repeated by your doctor within one month. --------------

## 2018-01-09 NOTE — ED Notes (Signed)
Pt ambulatory to bathroom with no issues.

## 2018-01-09 NOTE — ED Notes (Addendum)
6mg  of adenosine administered.  Pt tolerated well. Pt HR initially improved with a rate or 130.  Pt cardiac rhythm shows atrial flutter.

## 2018-01-09 NOTE — ED Provider Notes (Signed)
[facility] EMERGENCY DEPARTMENT Provider Note   CSN: [id number] Arrival date & time: 01/09/18  9509     History   Chief Complaint Chief Complaint  Patient presents with  . Tachycardia    HPI Brittany Porter is a 72 y.o. female.  Patient with history of cancer currently on oral chemotherapy, diabetes, hyperlipidemia --presents with complaint of elevated heart rate.  Patient states that she checks her blood pressure every night.  Last night when she checked it she noted that her heart rate was in the 170-180 range.  She did not have any chest pain, shortness of breath, lightheadedness or near syncope.  Patient states that she checked it 2 nights ago and her heart rate was reportedly normal.  Typically she runs in the 60-70 range.  She told her family who encouraged her to come to the emergency department today.  No previous history of heart attack, atrial fibrillation, or other arrhythmia.  She is not anticoagulated and is not on any rate controlling medications.  She did take a baby aspirin last night and this morning in hopes of improving her heart rate.  She otherwise reports being in good health.  She was outside weed whacking yesterday.  No recent fevers, cough, URI symptoms. No history of thyroid problems.      Past Medical History:  Diagnosis Date  . Arthritis   . Breast cancer ([place])   . Diabetes mellitus   . Endometrial carcinoma ([place]) 10/2008  . Fluid overload   . History of radiation therapy 04/13/2009, 04/16/2009, 04/27/2009, 05/07/2009, 05/18/2009   3000 cGy to proximal vagina  . Hyperlipidemia   . Iron deficiency   . Melanoma ([place]) 2010   rt arm and back  . Ovarian cancer ([place])   . Pleural effusion 04/2010    Patient Active Problem List   Diagnosis Date Noted  . 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 Breast cancer metastasized to multiple sites, unspecified laterality ([place]) 03/27/2016  . History of ovarian cancer 03/27/2016  .  History of endometrial cancer 03/27/2016  . History of melanoma 03/27/2016  . Poor venous access 03/27/2016  . Encounter for antineoplastic chemotherapy 10/02/2015  . Breast cancer metastasized to multiple sites ([place]) 10/02/2015  . Breast cancer metastasized to liver ([place]) 09/27/2015  . Liver lesion, right lobe 09/06/2015  . Capsulitis 07/23/2015  . DDD (degenerative disc disease), lumbar 06/27/2015  . Ovarian CA, unspecified laterality ([place]) 05/08/2015  . Osteoarthritis of ankle and foot 04/01/2015  . Morbid obesity with BMI of 40.0-44.9, adult ([place]) 03/29/2015  . Bilateral malignant neoplasm of breast in female [facility]) 03/29/2015  . Breast cancer metastasized to pleura ([place]) 12/11/2014  . Obesity (BMI 30-39.9) 12/11/2014  . 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 High risk medication use 12/11/2014  . Bilateral breast cancer ([place]) 09/25/2013  . DM2 (diabetes mellitus, type 2) ([place]) 01/21/2012  . Hyperlipidemia 01/21/2012  . Melanoma of upper back excluding scapular region ([place]) 09/22/2011  . Ovarian cancer ([place]) 08/09/2011  . Uterine carcinoma ([place]) 08/09/2011    Past Surgical History:  Procedure Laterality Date  . ABDOMINAL HYSTERECTOMY  10/2008  . INSERTION OF MESH  05/28/2012   Procedure: INSERTION OF MESH;  Surgeon: Adin Hector, MD;  Location: [facility];  Service: General;  Laterality: N/A;  . KNEE ARTHROSCOPY  04/2010   Right knee  . LAPAROSCOPIC LYSIS OF ADHESIONS  05/28/2012   Procedure: LAPAROSCOPIC LYSIS OF ADHESIONS;  Surgeon: Adin Hector, MD;  Location: [facility];  Service: General;  Laterality: N/A;  . MASTECTOMY  1997   Bilateral with lymph nodes  . Melanoma removal  02/2009, 07/2009  . VENTRAL HERNIA REPAIR  05/28/2012   Procedure: LAPAROSCOPIC VENTRAL HERNIA;  Surgeon: Adin Hector, MD;  Location: [facility];  Service: General;  Laterality: N/A;     OB History   None      Home Medications    Prior to Admission medications   Medication Sig Start Date End  Date Taking? Authorizing Provider  Calcium Carb-Cholecalciferol (CALCIUM 600+D3) 600-800 MG-UNIT TABS Take 1 tablet by mouth daily.    Yes [provider]  Cholecalciferol (VITAMIN D3) 2000 UNITS TABS Take 2,000 mg by mouth daily.   Yes [provider]  ferrous sulfate 325 (65 FE) MG tablet Take 325 mg by mouth daily with breakfast.   Yes [provider]  fish oil-omega-3 fatty acids 1000 MG capsule Take 1 g by mouth 2 (two) times daily.    Yes [provider]  furosemide (LASIX) 20 MG tablet Take 20 mg by mouth daily as needed for fluid or edema. Reported on 05/07/2015 10/08/14  Yes [provider]  glimepiride (AMARYL) 2 MG tablet Take 2 mg by mouth daily with breakfast.   Yes [provider]  IBRANCE 125 MG capsule TAKE 1 CAPSULE (125 MG TOTAL) BY MOUTH DAILY WITH BREAKFAST. TAKE FOR 21 DAYS ON, 7 DAYS OFF. TAKE WHOLE WITH FOOD. Patient taking differently: Take 125 mg by mouth daily with breakfast.  01/05/18  Yes Nicholas Lose, MD  ibuprofen (ADVIL,MOTRIN) 200 MG tablet Take 400 mg by mouth every 6 (six) hours as needed for headache (pain). Reported on 11/12/2015   Yes [provider]  letrozole (FEMARA) 2.5 MG tablet Take 1 tablet (2.5 mg total) by mouth daily. 05/02/17  Yes Nicholas Lose, MD  metFORMIN (GLUCOPHAGE) 500 MG tablet Take 500 mg by mouth 2 (two) times daily with a meal.     Yes [provider]  naproxen (NAPROSYN) 500 MG tablet Take 500 mg by mouth 2 (two) times daily as needed. 11/19/17  Yes [provider]  simvastatin (ZOCOR) 40 MG tablet Take 40 mg by mouth at bedtime.     Yes [provider]  benzonatate (TESSALON) 100 MG capsule Take 1 capsule (100 mg total) by mouth every 8 (eight) hours as needed for cough. Patient not taking: Reported on 01/09/2018 04/28/16   Gordy Levan, MD  nystatin (MYCOSTATIN) powder Apply to right groin 2-3 times a day Patient not taking: Reported on 01/09/2018  10/29/15   Gordy Levan, MD  ondansetron (ZOFRAN) 8 MG tablet Take 1 tablet (8 mg total) by mouth every 8 (eight) hours as needed for nausea or vomiting. Patient not taking: Reported on 05/26/2016 10/01/15   Gordy Levan, MD    Family History Family History  Problem Relation Age of Onset  . Stroke Mother   . Cancer Father   . Colon cancer Sister   . Stroke Sister   . Cancer Sister        breast  . Heart attack Brother   . Cancer Brother        skin  . Skin cancer Brother   . Cancer Sister        colon    Social History Social History   Tobacco Use  . Smoking status: Never Smoker  . Smokeless tobacco: Never Used  Substance Use Topics  . Alcohol  use: No  . Drug use: No     Allergies   Sulfa antibiotics   Review of Systems Review of Systems  Constitutional: Negative for diaphoresis and fever.  Eyes: Negative for redness.  Respiratory: Negative for cough and shortness of breath.   Cardiovascular: Negative for chest pain, palpitations and leg swelling.  Gastrointestinal: Negative for abdominal pain, nausea and vomiting.  Genitourinary: Negative for dysuria.  Musculoskeletal: Negative for back pain and neck pain.  Skin: Negative for rash.  Neurological: Negative for syncope and light-headedness.  Psychiatric/Behavioral: The patient is not nervous/anxious.      Physical Exam Updated Vital Signs BP 116/82   Pulse (!) 54   Temp 98.3 F (36.8 C) (Oral)   Resp (!) 22   SpO2 100%   Physical Exam  Constitutional: She appears well-developed and well-nourished.  HENT:  Head: Normocephalic and atraumatic.  Eyes: Conjunctivae are normal. Right eye exhibits no discharge. Left eye exhibits no discharge.  Neck: Normal range of motion. Neck supple.  Cardiovascular: Regular rhythm and normal heart sounds. Tachycardia present.  Rate fairly steady at 170.   Pulmonary/Chest: Effort normal and breath sounds normal.  Abdominal: Soft. There is no tenderness.    Neurological: She is alert.  Skin: Skin is warm and dry.  Psychiatric: She has a normal mood and affect.  Nursing note and vitals reviewed.    ED Treatments / Results  Labs (all labs ordered are listed, but only abnormal results are displayed) Labs Reviewed  CBC - Abnormal; Notable for the following components:      Result Value   WBC 3.4 (*)    RBC 3.31 (*)    Hemoglobin 11.7 (*)    MCV 110.3 (*)    MCH 35.3 (*)    Platelets 139 (*)    All other components within normal limits  BASIC METABOLIC PANEL - Abnormal; Notable for the following components:   Potassium 5.2 (*)    Glucose, Bld 220 (*)    BUN 35 (*)    Creatinine, Ser 1.19 (*)    Calcium 8.6 (*)    GFR calc non Af Amer 44 (*)    GFR calc Af Amer 51 (*)    All other components within normal limits  MAGNESIUM - Abnormal; Notable for the following components:   Magnesium 1.2 (*)    All other components within normal limits  TSH  I-STAT TROPONIN, ED    EKG EKG Interpretation  Date/Time:  Tuesday January 09 2018 10:34:41 EDT Ventricular Rate:  148 PR Interval:    QRS Duration: 90 QT Interval:  306 QTC Calculation: 482 R Axis:   75 Text Interpretation:  Atrial fibrillation with rapid V-rate Artifact in lead(s) I II III aVR aVL aVF V2 and baseline wander in lead(s) V1 A flutter with RVR  is new compared to prior.  No STEMI Confirmed by Antony Blackbird [phone]) on 01/09/2018 12:44:21 PM   Radiology Dg Chest Portable 1 View  Result Date: 01/09/2018 CLINICAL DATA:  Tachycardia EXAM: PORTABLE CHEST 1 VIEW COMPARISON:  06/30/2017 FINDINGS: Artifact overlies the chest. Chronic cardiomegaly. Allowing for the overlying artifact right chest is clear. There is chronic pleural density on the left with chronic volume loss at the left base. No acute bone finding. Surgical clips presumably related to bilateral mastectomy. IMPRESSION: Overlying artifact. Right chest clear. Chronic left pleural density with chronic left base volume  loss. Electronically Signed   By: Nelson Chimes M.D.   On: 01/09/2018 11:18  Procedures Procedures (including critical care time)  Medications Ordered in ED Medications  diltiazem (CARDIZEM) 1 mg/mL load via infusion 10 mg (10 mg Intravenous Bolus from Bag 01/09/18 1055)    And  diltiazem (CARDIZEM) 100 mg in dextrose 5% 152mL (1 mg/mL) infusion (0 mg/hr Intravenous Stopped 01/09/18 1338)  adenosine (ADENOCARD) 6 MG/2ML injection 6 mg (6 mg Intravenous Given 01/09/18 1028)     Initial Impression / Assessment and Plan / ED Course  I have reviewed the triage vital signs and the nursing notes.  Pertinent labs & imaging results that were available during my care of the patient were reviewed by me and considered in my medical decision making (see chart for details).     Patient seen and examined. No improvement with vagal maneuvers.   Vital signs reviewed and are as follows: BP 116/82   Pulse (!) 54   Temp 98.3 F (36.8 C) (Oral)   Resp (!) 22   SpO2 100%   Patient discussed with and seen by Dr. Sherry Ruffing.  Plan for adenosine sent for diagnostic/therapeutic purposes.  Discussed with patient and family at bedside.  Adenosine given with Dr. Sherry Ruffing at bedside.  Patient did not convert however it appears there is underlying Afib/aflutter.   Spoke with Wannetta Sender, cardiology who will consult on patient.   Pt rechecked -- now in NSR around 80,   1:46 PM Pt with reported hypotension -- asked that cardizem is stopped.   Pt seen. I rechecked her BP -- 539'J systolic. She does not report any symptoms from low BP. She was briefly in sinus tach with what looked like inverted p-waves on monitor and then back into NSR. Awaiting cardiology consult.   Cardiology has seen patient.  They recommend starting patient on Cardizem.  Patient will be able to be discharged home.  They have arranged for A. fib clinic follow-up in 3 days.  Will defer anticoagulation decision on since seen by A. fib clinic  given her comp located oncology history.  CRITICAL CARE Performed by: Carlisle Cater PA-C Total critical care time: 45 minutes Critical care time was exclusive of separately billable procedures and treating other patients. Critical care was necessary to treat or prevent imminent or life-threatening deterioration. Critical care was time spent personally by me on the following activities: development of treatment plan with patient and/or surrogate as well as nursing, discussions with consultants, evaluation of patient's response to treatment, examination of patient, obtaining history from patient or surrogate, ordering and performing treatments and interventions, ordering and review of laboratory studies, ordering and review of radiographic studies, pulse oximetry and re-evaluation of patient's condition.   Final Clinical Impressions(s) / ED Diagnoses   Final diagnoses:  Atrial flutter with rapid ventricular response ([place])   Patient with new onset atrial flutter with rapid ventricular response.  Patient was given adenosine to differentiate rhythm and then converted to normal sinus rhythm while on Cardizem for rate control.  She has remained asymptomatic throughout her course.  Lab work-up is reassuring without any acute abnormalities.  Cardiology has seen and has made recommendations for follow-up and treatment.  ED Discharge Orders    None       Carlisle Cater, [place] 01/09/18 1639    Tegeler, Gwenyth Allegra, MD 01/09/18 [phone]

## 2018-01-09 NOTE — ED Notes (Addendum)
Pt given turkey sandwich and gingerale. 

## 2018-01-09 NOTE — ED Triage Notes (Signed)
Pt took blood pressure this morning as she always does and noted HR to be in 150's. Pt denies any pain. Pt does feel like her " heart is beating fast".

## 2018-01-10 MED FILL — IBRANCE 125 MG CAPSULE: 125 | 28 days supply | Qty: 21 | Fill #0

## 2018-01-12 ENCOUNTER — Ambulatory Visit (HOSPITAL_COMMUNITY): Payer: Medicare Other | Admitting: Nurse Practitioner

## 2018-01-16 ENCOUNTER — Encounter (HOSPITAL_COMMUNITY): Payer: Self-pay | Admitting: Nurse Practitioner

## 2018-01-16 ENCOUNTER — Other Ambulatory Visit: Payer: Self-pay

## 2018-01-16 ENCOUNTER — Ambulatory Visit (HOSPITAL_COMMUNITY)
Admission: RE | Admit: 2018-01-16 | Discharge: 2018-01-16 | Disposition: A | Discharge status: Home / Self Care | Payer: Medicare Other | Source: Ambulatory Visit | Attending: Nurse Practitioner | Admitting: Nurse Practitioner

## 2018-01-16 VITALS — BP 132/74 | HR 84 | Ht 63.0 in | Wt 228.0 lb

## 2018-01-16 DIAGNOSIS — I483 Typical atrial flutter: Secondary | ICD-10-CM

## 2018-01-16 DIAGNOSIS — Z800 Family history of malignant neoplasm of digestive organs: Secondary | ICD-10-CM | POA: Insufficient documentation

## 2018-01-16 DIAGNOSIS — Z803 Family history of malignant neoplasm of breast: Secondary | ICD-10-CM | POA: Insufficient documentation

## 2018-01-16 DIAGNOSIS — Z808 Family history of malignant neoplasm of other organs or systems: Secondary | ICD-10-CM | POA: Diagnosis not present

## 2018-01-16 DIAGNOSIS — Z923 Personal history of irradiation: Secondary | ICD-10-CM | POA: Insufficient documentation

## 2018-01-16 DIAGNOSIS — Z823 Family history of stroke: Secondary | ICD-10-CM | POA: Insufficient documentation

## 2018-01-16 DIAGNOSIS — I4892 Unspecified atrial flutter: Secondary | ICD-10-CM | POA: Diagnosis not present

## 2018-01-16 DIAGNOSIS — Z9013 Acquired absence of bilateral breasts and nipples: Secondary | ICD-10-CM | POA: Diagnosis not present

## 2018-01-16 DIAGNOSIS — Z8249 Family history of ischemic heart disease and other diseases of the circulatory system: Secondary | ICD-10-CM | POA: Insufficient documentation

## 2018-01-16 DIAGNOSIS — Z79899 Other long term (current) drug therapy: Secondary | ICD-10-CM | POA: Insufficient documentation

## 2018-01-16 DIAGNOSIS — Z7984 Long term (current) use of oral hypoglycemic drugs: Secondary | ICD-10-CM | POA: Insufficient documentation

## 2018-01-16 DIAGNOSIS — Z8542 Personal history of malignant neoplasm of other parts of uterus: Secondary | ICD-10-CM | POA: Diagnosis not present

## 2018-01-16 DIAGNOSIS — Z79811 Long term (current) use of aromatase inhibitors: Secondary | ICD-10-CM | POA: Diagnosis not present

## 2018-01-16 DIAGNOSIS — Z85820 Personal history of malignant melanoma of skin: Secondary | ICD-10-CM | POA: Diagnosis not present

## 2018-01-16 DIAGNOSIS — E785 Hyperlipidemia, unspecified: Secondary | ICD-10-CM | POA: Diagnosis not present

## 2018-01-16 DIAGNOSIS — Z8543 Personal history of malignant neoplasm of ovary: Secondary | ICD-10-CM | POA: Diagnosis not present

## 2018-01-16 DIAGNOSIS — Z853 Personal history of malignant neoplasm of breast: Secondary | ICD-10-CM | POA: Diagnosis not present

## 2018-01-16 DIAGNOSIS — Z90710 Acquired absence of both cervix and uterus: Secondary | ICD-10-CM | POA: Diagnosis not present

## 2018-01-16 DIAGNOSIS — Z882 Allergy status to sulfonamides status: Secondary | ICD-10-CM | POA: Insufficient documentation

## 2018-01-16 DIAGNOSIS — M1990 Unspecified osteoarthritis, unspecified site: Secondary | ICD-10-CM | POA: Insufficient documentation

## 2018-01-16 DIAGNOSIS — E119 Type 2 diabetes mellitus without complications: Secondary | ICD-10-CM | POA: Diagnosis not present

## 2018-01-16 MED ORDER — APIXABAN 5 MG PO TABS: 5.0000 mg | ORAL_TABLET | Freq: Two times a day (BID) | ORAL | 0 refills | Status: DC

## 2018-01-16 NOTE — Patient Instructions (Signed)
Start eliquis 5mg  twice a day

## 2018-01-17 NOTE — Progress Notes (Addendum)
Primary Care Physician: [name], MD Referring Physician: [facility] ER f/u   Brittany Porter is a 72 y.o. female with a h/o Breast CA, DM, that was in the ER for new onset of atrial flutter with RVR. V rate was around 170 bpm. The pt did not feel the episode but picked the elevated HR up fo routine check of V/S. She  was not started on anticoagulation for concerns of her therapy for h/o breast cancer. CHA2DS2VASc score is 3. She is in SR today. No tobacco, alcohol, snoring. Is sedentary and obese.   Today, she denies symptoms of palpitations, chest pain, shortness of breath, orthopnea, PND, lower extremity edema, dizziness, presyncope, syncope, or neurologic sequela. The patient is tolerating medications without difficulties and is otherwise without complaint today.   Past Medical History:  Diagnosis Date  . Arthritis   . Breast cancer ([place])   . Diabetes mellitus   . Endometrial carcinoma ([place]) 10/2008  . Fluid overload   . History of radiation therapy 04/13/2009, 04/16/2009, 04/27/2009, 05/07/2009, 05/18/2009   3000 cGy to proximal vagina  . Hyperlipidemia   . Iron deficiency   . Melanoma ([place]) 2010   rt arm and back  . Ovarian cancer ([place])   . Pleural effusion 04/2010   Past Surgical History:  Procedure Laterality Date  . ABDOMINAL HYSTERECTOMY  10/2008  . INSERTION OF MESH  05/28/2012   Procedure: INSERTION OF MESH;  Surgeon: Adin Hector, MD;  Location: [facility];  Service: General;  Laterality: N/A;  . KNEE ARTHROSCOPY  04/2010   Right knee  . LAPAROSCOPIC LYSIS OF ADHESIONS  05/28/2012   Procedure: LAPAROSCOPIC LYSIS OF ADHESIONS;  Surgeon: Adin Hector, MD;  Location: [facility];  Service: General;  Laterality: N/A;  . MASTECTOMY  1997   Bilateral with lymph nodes  . Melanoma removal  02/2009, 07/2009  . VENTRAL HERNIA REPAIR  05/28/2012   Procedure: LAPAROSCOPIC VENTRAL HERNIA;  Surgeon: Adin Hector, MD;  Location: [facility];  Service: General;  Laterality: N/A;    Current  Outpatient Medications  Medication Sig Dispense Refill  . Calcium Carb-Cholecalciferol (CALCIUM 600+D3) 600-800 MG-UNIT TABS Take 1 tablet by mouth daily.     . Cholecalciferol (VITAMIN D3) 2000 UNITS TABS Take 2,000 mg by mouth daily.    Marland Kitchen diltiazem (CARDIZEM CD) 120 MG 24 hr capsule Take 1 capsule (120 mg total) by mouth daily. 30 capsule 0  . ferrous sulfate 325 (65 FE) MG tablet Take 325 mg by mouth daily with breakfast.    . fish oil-omega-3 fatty acids 1000 MG capsule Take 1 g by mouth 2 (two) times daily.     . furosemide (LASIX) 20 MG tablet Take 20 mg by mouth daily as needed for fluid or edema. Reported on 05/07/2015    . glimepiride (AMARYL) 2 MG tablet Take 2 mg by mouth daily with breakfast.    . IBRANCE 125 MG capsule TAKE 1 CAPSULE (125 MG TOTAL) BY MOUTH DAILY WITH BREAKFAST. TAKE FOR 21 DAYS ON, 7 DAYS OFF. TAKE WHOLE WITH FOOD. (Patient taking differently: Take 125 mg by mouth daily with breakfast. ) 21 capsule 2  . ibuprofen (ADVIL,MOTRIN) 200 MG tablet Take 400 mg by mouth every 6 (six) hours as needed for headache (pain). Reported on 11/12/2015    . letrozole (FEMARA) 2.5 MG tablet Take 1 tablet (2.5 mg total) by mouth daily. 90 tablet 3  . metFORMIN (GLUCOPHAGE) 500 MG tablet Take 500 mg by mouth  2 (two) times daily with a meal.      . naproxen (NAPROSYN) 500 MG tablet Take 500 mg by mouth 2 (two) times daily as needed.  1  . simvastatin (ZOCOR) 40 MG tablet Take 40 mg by mouth at bedtime.      Marland Kitchen apixaban (ELIQUIS) 5 MG TABS tablet Take 1 tablet (5 mg total) by mouth 2 (two) times daily. 60 tablet 0   No current facility-administered medications for this encounter.     Allergies  Allergen Reactions  . Sulfa Antibiotics Swelling    "Eyes swelled shut" - reaction to eye drops    Social History   Socioeconomic History  . Marital status: Married    Spouse name: Not on file  . Number of children: Not on file  . Years of education: Not on file  . Highest education  level: Not on file  Occupational History  . Not on file  Social Needs  . Financial resource strain: Not on file  . Food insecurity:    Worry: Not on file    Inability: Not on file  . Transportation needs:    Medical: Not on file    Non-medical: Not on file  Tobacco Use  . Smoking status: Never Smoker  . Smokeless tobacco: Never Used  Substance and Sexual Activity  . Alcohol use: No  . Drug use: No  . Sexual activity: Yes  Lifestyle  . Physical activity:    Days per week: Not on file    Minutes per session: Not on file  . Stress: Not on file  Relationships  . Social connections:    Talks on phone: Not on file    Gets together: Not on file    Attends religious service: Not on file    Active member of club or organization: Not on file    Attends meetings of clubs or organizations: Not on file    Relationship status: Not on file  . Intimate partner violence:    Fear of current or ex partner: Not on file    Emotionally abused: Not on file    Physically abused: Not on file    Forced sexual activity: Not on file  Other Topics Concern  . Not on file  Social History Narrative  . Not on file    Family History  Problem Relation Age of Onset  . Stroke Mother   . Cancer Father   . Colon cancer Sister   . Stroke Sister   . Cancer Sister        breast  . Heart attack Brother   . Cancer Brother        skin  . Skin cancer Brother   . Cancer Sister        colon    ROS- All systems are reviewed and negative except as per the HPI above  Physical Exam: Vitals:   01/16/18 1401  BP: 132/74  Pulse: 84  Weight: 103.4 kg  Height: 5\' 3"  (1.6 m)   Wt Readings from Last 3 Encounters:  01/16/18 103.4 kg  11/08/17 102 kg  08/03/17 102.5 kg    Labs: Lab Results  Component Value Date   NA 142 01/09/2018   K 5.2 (H) 01/09/2018   CL 106 01/09/2018   CO2 27 01/09/2018   GLUCOSE 220 (H) 01/09/2018   BUN 35 (H) 01/09/2018   CREATININE 1.19 (H) 01/09/2018   CALCIUM 8.6  (L) 01/09/2018   MG 1.2 (L) 01/09/2018  Lab Results  Component Value Date   INR 1.08 09/11/2015   Lab Results  Component Value Date   CHOL 153 12/23/2008   HDL 60 12/23/2008   LDLCALC 75 12/23/2008   TRIG 92 12/23/2008     GEN- The patient is well appearing, alert and oriented x 3 today.   Head- normocephalic, atraumatic Eyes-  Sclera clear, conjunctiva pink Ears- hearing intact Oropharynx- clear Neck- supple, no JVP Lymph- no cervical lymphadenopathy Lungs- Clear to ausculation bilaterally, normal work of breathing Heart- Regular rate and rhythm, no murmurs, rubs or gallops, PMI not laterally displaced GI- soft, NT, ND, + BS Extremities- no clubbing, cyanosis, or edema MS- no significant deformity or atrophy Skin- no rash or lesion Psych- euthymic mood, full affect Neuro- strength and sensation are intact  EKG-NSR at 84 bpm, PR int 150 ms, qrs int 82 ms, qtc 451 ms Epic records reviewed    Assessment and Plan: 1. New onset atrial flutter Reviewed  with Dr. Rayann Heman and he feels it is typical/ablatable  General education re afib/flutter No triggers identified Diltiazem 120 mg qd started in the ER and will continue Echo  2. CHA2DS2VASc of 3 Discussed anticoagulation options with pt in detail  I discussed her current meds with PharmD and no contraindications for DOAC She will start eliquis 5 mg bid She will not use NSAIDS Bleeding precautions discuused She will see her oncologist in 2 weeks and will need CBC checked IF arrhythmia is ablatable, she will not need to be on anticoagulation long term  F/u with Dr. Rayann Heman in 3 weeks  Geroge Baseman. Angus Amini, [facility] [address] [place], [place] [address] [phone]

## 2018-01-26 ENCOUNTER — Ambulatory Visit (HOSPITAL_COMMUNITY)
Admission: RE | Admit: 2018-01-26 | Discharge: 2018-01-26 | Disposition: A | Discharge status: Home / Self Care | Payer: Medicare Other | Source: Ambulatory Visit | Attending: Nurse Practitioner | Admitting: Nurse Practitioner

## 2018-01-26 DIAGNOSIS — I483 Typical atrial flutter: Secondary | ICD-10-CM | POA: Insufficient documentation

## 2018-01-26 DIAGNOSIS — E785 Hyperlipidemia, unspecified: Secondary | ICD-10-CM | POA: Insufficient documentation

## 2018-01-26 DIAGNOSIS — E119 Type 2 diabetes mellitus without complications: Secondary | ICD-10-CM | POA: Insufficient documentation

## 2018-01-26 DIAGNOSIS — Z853 Personal history of malignant neoplasm of breast: Secondary | ICD-10-CM | POA: Insufficient documentation

## 2018-01-26 DIAGNOSIS — I4891 Unspecified atrial fibrillation: Secondary | ICD-10-CM | POA: Diagnosis present

## 2018-01-26 NOTE — Progress Notes (Signed)
  Echocardiogram 2D Echocardiogram has been performed.  Brittany Porter 01/26/2018, 4:16 PM

## 2018-01-30 ENCOUNTER — Encounter (HOSPITAL_COMMUNITY): Payer: Self-pay | Admitting: *Deleted

## 2018-02-05 ENCOUNTER — Encounter (HOSPITAL_COMMUNITY)
Admission: RE | Admit: 2018-02-05 | Discharge: 2018-02-05 | Disposition: A | Discharge status: Home / Self Care | Payer: Medicare Other | Source: Ambulatory Visit | Attending: Hematology and Oncology | Admitting: Hematology and Oncology

## 2018-02-05 DIAGNOSIS — C787 Secondary malignant neoplasm of liver and intrahepatic bile duct: Secondary | ICD-10-CM | POA: Diagnosis present

## 2018-02-05 DIAGNOSIS — C50919 Malignant neoplasm of unspecified site of unspecified female breast: Secondary | ICD-10-CM

## 2018-02-05 LAB — GLUCOSE, CAPILLARY: Glucose-Capillary: 76 mg/dL (ref 70–99)

## 2018-02-05 MED ORDER — FLUDEOXYGLUCOSE F - 18 (FDG) INJECTION
11.4600 | Freq: Once | INTRAVENOUS | Status: AC | PRN
  Administered 2018-02-05: 11.46 via INTRAVENOUS

## 2018-02-07 ENCOUNTER — Other Ambulatory Visit: Payer: Self-pay

## 2018-02-07 DIAGNOSIS — C50911 Malignant neoplasm of unspecified site of right female breast: Secondary | ICD-10-CM

## 2018-02-07 DIAGNOSIS — C787 Secondary malignant neoplasm of liver and intrahepatic bile duct: Principal | ICD-10-CM

## 2018-02-08 ENCOUNTER — Other Ambulatory Visit: Payer: Self-pay

## 2018-02-08 ENCOUNTER — Ambulatory Visit: Payer: Medicare Other

## 2018-02-08 ENCOUNTER — Other Ambulatory Visit: Payer: Self-pay | Admitting: Hematology and Oncology

## 2018-02-08 ENCOUNTER — Inpatient Hospital Stay (HOSPITAL_BASED_OUTPATIENT_CLINIC_OR_DEPARTMENT_OTHER): Payer: Medicare Other | Admitting: Hematology and Oncology

## 2018-02-08 ENCOUNTER — Inpatient Hospital Stay: Payer: Medicare Other

## 2018-02-08 ENCOUNTER — Telehealth: Payer: Self-pay | Admitting: Hematology and Oncology

## 2018-02-08 ENCOUNTER — Inpatient Hospital Stay: Payer: Medicare Other | Attending: Hematology and Oncology

## 2018-02-08 DIAGNOSIS — C50919 Malignant neoplasm of unspecified site of unspecified female breast: Secondary | ICD-10-CM

## 2018-02-08 DIAGNOSIS — Z5111 Encounter for antineoplastic chemotherapy: Secondary | ICD-10-CM | POA: Diagnosis present

## 2018-02-08 DIAGNOSIS — Z23 Encounter for immunization: Secondary | ICD-10-CM

## 2018-02-08 DIAGNOSIS — C7951 Secondary malignant neoplasm of bone: Principal | ICD-10-CM

## 2018-02-08 DIAGNOSIS — M545 Low back pain: Secondary | ICD-10-CM

## 2018-02-08 DIAGNOSIS — C787 Secondary malignant neoplasm of liver and intrahepatic bile duct: Secondary | ICD-10-CM

## 2018-02-08 DIAGNOSIS — C50911 Malignant neoplasm of unspecified site of right female breast: Secondary | ICD-10-CM

## 2018-02-08 LAB — CMP (CANCER CENTER ONLY)
ALT: 13 U/L (ref 0–44)
AST: 20 U/L (ref 15–41)
Albumin: 3.7 g/dL (ref 3.5–5.0)
Alkaline Phosphatase: 41 U/L (ref 38–126)
Anion gap: 9 (ref 5–15)
BUN: 16 mg/dL (ref 8–23)
CO2: 30 mmol/L (ref 22–32)
Calcium: 9.6 mg/dL (ref 8.9–10.3)
Chloride: 103 mmol/L (ref 98–111)
Creatinine: 0.89 mg/dL (ref 0.44–1.00)
GFR, Est AFR Am: >60 mL/min (ref >60)
GFR, Estimated: >60 mL/min (ref >60)
Glucose, Bld: 186 mg/dL — ABNORMAL HIGH (ref 70–99)
Potassium: 5.2 mmol/L — ABNORMAL HIGH (ref 3.5–5.1)
Sodium: 142 mmol/L (ref 135–145)
Total Bilirubin: 0.9 mg/dL (ref 0.3–1.2)
Total Protein: 6.8 g/dL (ref 6.5–8.1)

## 2018-02-08 LAB — CBC WITH DIFFERENTIAL (CANCER CENTER ONLY)
Basophils Absolute: 0 10*3/uL (ref 0.0–0.1)
Basophils Relative: 1 %
Eosinophils Absolute: 0 10*3/uL (ref 0.0–0.5)
Eosinophils Relative: 1 %
HCT: 34.2 % — ABNORMAL LOW (ref 34.8–46.6)
Hemoglobin: 11.5 g/dL — ABNORMAL LOW (ref 11.6–15.9)
Lymphocytes Relative: 34 %
Lymphs Abs: 0.8 10*3/uL — ABNORMAL LOW (ref 0.9–3.3)
MCH: 35.4 pg — ABNORMAL HIGH (ref 25.1–34.0)
MCHC: 33.5 g/dL (ref 31.5–36.0)
MCV: 105.7 fL — ABNORMAL HIGH (ref 79.5–101.0)
Monocytes Absolute: 0.2 10*3/uL (ref 0.1–0.9)
Monocytes Relative: 9 %
Neutro Abs: 1.3 10*3/uL — ABNORMAL LOW (ref 1.5–6.5)
Neutrophils Relative %: 55 %
Platelet Count: 134 10*3/uL — ABNORMAL LOW (ref 145–400)
RBC: 3.23 MIL/uL — ABNORMAL LOW (ref 3.70–5.45)
RDW: 16.1 % — ABNORMAL HIGH (ref 11.2–14.5)
WBC Count: 2.4 10*3/uL — ABNORMAL LOW (ref 3.9–10.3)

## 2018-02-08 MED ORDER — INFLUENZA VAC SPLIT HIGH-DOSE 0.5 ML IM SUSY: 0.5000 mL | PREFILLED_SYRINGE | INTRAMUSCULAR | Status: DC

## 2018-02-08 MED ORDER — DENOSUMAB 120 MG/1.7ML ~~LOC~~ SOLN
SUBCUTANEOUS | Status: AC
  Filled 2018-02-08: qty 1.7

## 2018-02-08 MED ORDER — INFLUENZA VAC SPLIT QUAD 0.5 ML IM SUSY
0.5000 mL | PREFILLED_SYRINGE | Freq: Once | INTRAMUSCULAR | Status: AC
  Administered 2018-02-08: 0.5 mL via INTRAMUSCULAR

## 2018-02-08 MED ORDER — INFLUENZA VAC SPLIT QUAD 0.5 ML IM SUSY
PREFILLED_SYRINGE | INTRAMUSCULAR | Status: AC
  Filled 2018-02-08: qty 0.5

## 2018-02-08 MED ORDER — FULVESTRANT 250 MG/5ML IM SOLN
500.0000 mg | Freq: Once | INTRAMUSCULAR | Status: AC
  Administered 2018-02-08: 500 mg via INTRAMUSCULAR

## 2018-02-08 MED ORDER — DENOSUMAB 120 MG/1.7ML ~~LOC~~ SOLN
120.0000 mg | Freq: Once | SUBCUTANEOUS | Status: AC
  Administered 2018-02-08: 120 mg via SUBCUTANEOUS

## 2018-02-08 MED FILL — IBRANCE 125 MG CAPSULE: 125 | 28 days supply | Qty: 21 | Fill #1

## 2018-02-08 NOTE — Assessment & Plan Note (Signed)
Metastatic breast cancer: recently progressive in liver and bone, also pleura. OnFaslodex + Ibrance beginning 03/31/2016  Toxicities with therapy: 1. Neutropenia: Mild does not require dose adjustment. 2. injection site discomfort very mild 3. Fatigue related to Ibrance  CT chest abdomen pelvis for restaging 07/20/2016: Stable left pleural metastases, right liver lobe metastases unchanged, multiple bone metastases slightly progressive from previous  PET CT scan 02/05/2018: Overall mild progression of malignancy.  Nodularity left lung base SUV 9.2 previously 8.4.  The hepatic metastatic lesions are increased in activity with a new segment 6 liver lesion.  Skeletal lesions are increased metabolic activity and some lesions appear larger, left pleural thickening with left pleural effusion, right lower paratracheal lymph node slightly smaller  Radiology review: I discussed the PET CT scan with the patient.  There is evidence of mild disease progression.  Options discussed: 1.  Continuation of current therapy I also recommended sending for molecular diagnostic studies to look for PI 3 kinase mutations BRCA mutation testing  Return to clinic based on the results of these tests

## 2018-02-08 NOTE — Telephone Encounter (Signed)
Gave patient avs and calendar.   °

## 2018-02-08 NOTE — Progress Notes (Unsigned)
Molecular studies sent and faxed to [facility], per Dr.Gudena orders.

## 2018-02-08 NOTE — Progress Notes (Signed)
Patient Care Team: [name], MD as PCP - General (Family Medicine)  DIAGNOSIS:  Encounter Diagnosis  Name Primary?  . Carcinoma of breast metastatic to bone, unspecified laterality ([place])     SUMMARY OF ONCOLOGIC HISTORY:   Breast cancer metastasized to liver [facility])   1997 Initial Diagnosis    bilateral breast cancers 1997 treated with bilateral mastectomies and axillary node dissections, adjuvant chemotherapy and radiation.    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 BRCA reportedly normal.    08/25/2015 Relapse/Recurrence    new solitary liver lesion, biopsy showing metastatic ER PR + breast    09/25/2015 - 03/11/2016 Chemotherapy    Xeloda. Stopped due to progression of liver and bone metastases; pleural metastases    03/31/2016 -  Anti-estrogen oral therapy    Faslodex plus Ibrance (along with Xgeva)     Carcinoma of breast metastatic to bone ([place])   03/27/2016 Initial Diagnosis    Carcinoma of breast metastatic to bone ([place])    08/03/2017 -  Chemotherapy    The patient had fulvestrant (FASLODEX) injection 500 mg, 500 mg, Intramuscular,  Once, 7 of 7 cycles Administration: 500 mg (08/03/2017), 500 mg (08/31/2017), 500 mg (10/11/2017), 500 mg (11/08/2017), 500 mg (12/07/2017), 500 mg (01/04/2018)  for chemotherapy treatment.      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  She had recent scans and is here today to discuss the results.  She has been on this treatment for the past 2 years.  The scans show slight progression of disease.  She reports no new symptoms or concerns.  Denies any fevers or chills.  Denies any nausea vomiting.  Denies any bone pain.  She does have mild back pain.  REVIEW OF SYSTEMS:   Constitutional: Denies fevers, chills or abnormal weight loss Eyes: Denies blurriness of vision Ears, nose, mouth, throat, and face: Denies mucositis or sore throat Respiratory: Denies cough, dyspnea or wheezes Cardiovascular: Denies palpitation, chest discomfort Gastrointestinal:  Denies nausea, heartburn or change in bowel habits Skin: Denies abnormal skin rashes Lymphatics: Denies new lymphadenopathy or easy bruising Neurological:Denies numbness, tingling or new weaknesses Behavioral/Psych: Mood is stable, no new changes  Extremities: No lower extremity edema   All other systems were reviewed with the patient and are negative.  I have reviewed the past medical history, past surgical history, social history and family history with the patient and they are unchanged from previous note.  ALLERGIES:  is allergic to sulfa antibiotics.  MEDICATIONS:  Current Outpatient Medications  Medication Sig Dispense Refill  . apixaban (ELIQUIS) 5 MG TABS tablet Take 1 tablet (5 mg total) by mouth 2 (two) times daily. 60 tablet 0  . Calcium Carb-Cholecalciferol (CALCIUM 600+D3) 600-800 MG-UNIT TABS Take 1 tablet by mouth daily.     . Cholecalciferol (VITAMIN D3) 2000 UNITS TABS Take 2,000 mg by mouth daily.    Marland Kitchen  diltiazem (CARDIZEM CD) 120 MG 24 hr capsule Take 1 capsule (120 mg total) by mouth daily. 30 capsule 0  . ferrous sulfate 325 (65 FE) MG tablet Take 325 mg by mouth daily with breakfast.    . fish oil-omega-3 fatty acids 1000 MG capsule Take 1 g by mouth 2 (two) times daily.     . furosemide (LASIX) 20 MG  tablet Take 20 mg by mouth daily as needed for fluid or edema. Reported on 05/07/2015    . glimepiride (AMARYL) 2 MG tablet Take 2 mg by mouth daily with breakfast.    . IBRANCE 125 MG capsule TAKE 1 CAPSULE (125 MG TOTAL) BY MOUTH DAILY WITH BREAKFAST. TAKE FOR 21 DAYS ON, 7 DAYS OFF. TAKE WHOLE WITH FOOD. (Patient taking differently: Take 125 mg by mouth daily with breakfast. ) 21 capsule 2  . ibuprofen (ADVIL,MOTRIN) 200 MG tablet Take 400 mg by mouth every 6 (six) hours as needed for headache (pain). Reported on 11/12/2015    . letrozole (FEMARA) 2.5 MG tablet Take 1 tablet (2.5 mg total) by mouth daily. 90 tablet 3  . metFORMIN (GLUCOPHAGE) 500 MG tablet Take 500 mg by mouth 2 (two) times daily with a meal.      . naproxen (NAPROSYN) 500 MG tablet Take 500 mg by mouth 2 (two) times daily as needed.  1  . simvastatin (ZOCOR) 40 MG tablet Take 40 mg by mouth at bedtime.       No current facility-administered medications for this visit.     PHYSICAL EXAMINATION: ECOG PERFORMANCE STATUS: 1 - Symptomatic but completely ambulatory  Vitals:   02/08/18 1134  BP: (!) 134/92  Pulse: 78  Resp: 18  Temp: 98.1 F (36.7 C)  SpO2: 98%   Filed Weights   02/08/18 1134  Weight: 223 lb 12.8 oz (101.5 kg)    GENERAL:alert, no distress and comfortable SKIN: skin color, texture, turgor are normal, no rashes or significant lesions EYES: normal, Conjunctiva are pink and non-injected, sclera clear OROPHARYNX:no exudate, no erythema and lips, buccal mucosa, and tongue normal  NECK: supple, thyroid normal size, non-tender, without nodularity LYMPH:  no palpable lymphadenopathy in the cervical, axillary or inguinal LUNGS: clear to auscultation and percussion with normal breathing effort HEART: regular rate & rhythm and no murmurs and no lower extremity edema ABDOMEN:abdomen soft, non-tender and normal bowel sounds MUSCULOSKELETAL:no cyanosis of digits and no clubbing  NEURO: alert & oriented x 3 with  fluent speech, no focal motor/sensory deficits EXTREMITIES: No lower extremity edema    LABORATORY DATA:  I have reviewed the data as listed CMP Latest Ref Rng & Units 02/08/2018 01/09/2018 11/08/2017  Glucose 70 - 99 mg/dL 186(H) 220(H) 74  BUN 8 - 23 mg/dL 16 35(H) 24(H)  Creatinine 0.44 - 1.00 mg/dL 0.89 1.19(H) 0.81  Sodium 135 - 145 mmol/L 142 142 141  Potassium 3.5 - 5.1 mmol/L 5.2(H) 5.2(H) 3.9  Chloride 98 - 111 mmol/L 103 106 100  CO2 22 - 32 mmol/L _0 Calcium 8.9 - 10.3 mg/dL 9.6 8.6(L) 9.8  Total Protein 6.5 - 8.1 g/dL 6.8 - 6.8  Total Bilirubin 0.3 - 1.2 mg/dL 0.9 - 1.0  Alkaline Phos 38 - 126 U/L 41 - 41  AST 15 - 41 U/L 20 - 30  ALT 0 - 44 U/L 13 - 17    Lab Results  Component Value Date   WBC 2.4 (L) 02/08/2018   HGB 11.5 (L) 02/08/2018   HCT  34.2 (L) 02/08/2018   MCV 105.7 (H) 02/08/2018   PLT 134 (L) 02/08/2018   NEUTROABS 1.3 (L) 02/08/2018    ASSESSMENT & PLAN:  Carcinoma of breast metastatic to bone [facility]) Metastatic breast cancer: recently progressive in liver and bone, also pleura. OnFaslodex + Ibrance beginning 03/31/2016  Toxicities with therapy: 1. Neutropenia: Mild does not require dose adjustment. 2. injection site discomfort very mild 3. Fatigue related to Ibrance  CT chest abdomen pelvis for restaging 07/20/2016: Stable left pleural metastases, right liver lobe metastases unchanged, multiple bone metastases slightly progressive from previous  PET CT scan 02/05/2018: Overall mild progression of malignancy.  Nodularity left lung base SUV 9.2 previously 8.4.  The hepatic metastatic lesions are increased in activity with a new segment 6 liver lesion.  Skeletal lesions are increased metabolic activity and some lesions appear larger, left pleural thickening with left pleural effusion, right lower paratracheal lymph node slightly smaller  Radiology review: I discussed the PET CT scan with the patient.  There is evidence of mild disease  progression.  Options discussed:  Continuation of current therapy versus changing to a different treatment I also recommended sending for molecular diagnostic studies to look for PI 3 kinase mutations or immunotherapy BRCA mutation testing  Return to clinic based on the results of these tests in 1 month for follow-up.    No orders of the defined types were placed in this encounter.  The patient has a good understanding of the overall plan. she agrees with it. she will call with any problems that may develop before the next visit here.   Harriette Ohara, MD 02/08/18

## 2018-02-09 ENCOUNTER — Ambulatory Visit: Payer: Medicare Other | Admitting: Internal Medicine

## 2018-02-09 ENCOUNTER — Encounter: Payer: Self-pay | Admitting: Internal Medicine

## 2018-02-09 VITALS — BP 128/80 | HR 92 | Ht 63.0 in | Wt 219.8 lb

## 2018-02-09 DIAGNOSIS — I483 Typical atrial flutter: Secondary | ICD-10-CM

## 2018-02-09 DIAGNOSIS — I5020 Unspecified systolic (congestive) heart failure: Secondary | ICD-10-CM | POA: Diagnosis not present

## 2018-02-09 NOTE — Progress Notes (Signed)
Electrophysiology Office Note   Date:  02/09/2018   ID:  Brittany Porter, DOB 04/30/1946, MRN [id number]  PCP:  [name], MD    Primary Electrophysiologist: Thompson Grayer, MD    CC: atrial flutter   History of Present Illness: Brittany Porter is a 72 y.o. female who presents today for electrophysiology evaluation.   She presented with tachypalpitations 8/27//19 (my consult note reviewed) and was found to have typical appearing 2:1 atrial flutter.  She received adenosine which made atrial flutter waves more visible but did not terminate her arrhythmia.  She converted to sinus rhythm with diltiazem and was dicharged.  She was not placed on anticoagulation due to ongoing treatment for breast CA. She has done well since. No further symptoms of arrhythmia.  Today, she denies symptoms of palpitations, chest pain, shortness of breath, orthopnea, PND, lower extremity edema, claudication, dizziness, presyncope, syncope, bleeding, or neurologic sequela. The patient is tolerating medications without difficulties and is otherwise without complaint today.    Past Medical History:  Diagnosis Date  . Arthritis   . Breast cancer ([place])   . Diabetes mellitus   . Endometrial carcinoma ([place]) 10/2008  . Fluid overload   . History of radiation therapy 04/13/2009, 04/16/2009, 04/27/2009, 05/07/2009, 05/18/2009   3000 cGy to proximal vagina  . Hyperlipidemia   . Iron deficiency   . Melanoma ([place]) 2010   rt arm and back  . Ovarian cancer ([place])   . Pleural effusion 04/2010   Past Surgical History:  Procedure Laterality Date  . ABDOMINAL HYSTERECTOMY  10/2008  . INSERTION OF MESH  05/28/2012   Procedure: INSERTION OF MESH;  Surgeon: Adin Hector, MD;  Location: [place];  Service: General;  Laterality: N/A;  . KNEE ARTHROSCOPY  04/2010   Right knee  . LAPAROSCOPIC LYSIS OF ADHESIONS  05/28/2012   Procedure: LAPAROSCOPIC LYSIS OF ADHESIONS;  Surgeon: Adin Hector, MD;  Location: [place];  Service:  General;  Laterality: N/A;  . MASTECTOMY  1997   Bilateral with lymph nodes  . Melanoma removal  02/2009, 07/2009  . VENTRAL HERNIA REPAIR  05/28/2012   Procedure: LAPAROSCOPIC VENTRAL HERNIA;  Surgeon: Adin Hector, MD;  Location: [place];  Service: General;  Laterality: N/A;     Current Outpatient Medications  Medication Sig Dispense Refill  . apixaban (ELIQUIS) 5 MG TABS tablet Take 1 tablet (5 mg total) by mouth 2 (two) times daily. 60 tablet 0  . Calcium Carb-Cholecalciferol (CALCIUM 600+D3) 600-800 MG-UNIT TABS Take 1 tablet by mouth daily.     . Cholecalciferol (VITAMIN D3) 2000 UNITS TABS Take 2,000 mg by mouth daily.    Marland Kitchen diltiazem (CARDIZEM CD) 120 MG 24 hr capsule Take 1 capsule (120 mg total) by mouth daily. 30 capsule 0  . ferrous sulfate 325 (65 FE) MG tablet Take 325 mg by mouth daily with breakfast.    . fish oil-omega-3 fatty acids 1000 MG capsule Take 1 g by mouth 2 (two) times daily.     . furosemide (LASIX) 20 MG tablet Take 20 mg by mouth daily as needed for fluid or edema. Reported on 05/07/2015    . glimepiride (AMARYL) 2 MG tablet Take 2 mg by mouth daily with breakfast.    . IBRANCE 125 MG capsule TAKE 1 CAPSULE (125 MG TOTAL) BY MOUTH DAILY WITH BREAKFAST. TAKE FOR 21 DAYS ON, 7 DAYS OFF. TAKE WHOLE WITH FOOD. (Patient taking differently: Take 125 mg by mouth daily with breakfast. )  21 capsule 2  . ibuprofen (ADVIL,MOTRIN) 200 MG tablet Take 400 mg by mouth every 6 (six) hours as needed for headache (pain). Reported on 11/12/2015    . letrozole (FEMARA) 2.5 MG tablet Take 1 tablet (2.5 mg total) by mouth daily. 90 tablet 3  . metFORMIN (GLUCOPHAGE) 500 MG tablet Take 500 mg by mouth 2 (two) times daily with a meal.      . naproxen (NAPROSYN) 500 MG tablet Take 500 mg by mouth 2 (two) times daily as needed.  1  . simvastatin (ZOCOR) 40 MG tablet Take 40 mg by mouth at bedtime.       No current facility-administered medications for this visit.     Allergies:    Sulfa antibiotics   Social History:  The patient  reports that she has never smoked. She has never used smokeless tobacco. She reports that she does not drink alcohol or use drugs.   Family History:  The patient's  family history includes Cancer in her brother, father, sister, and sister; Colon cancer in her sister; Heart attack in her brother; Skin cancer in her brother; Stroke in her mother and sister.    ROS:  Please see the history of present illness.   All other systems are personally reviewed and negative.    PHYSICAL EXAM: VS:  BP 128/80   Pulse 92   Ht 5\' 3"  (1.6 m)   Wt 219 lb 12.8 oz (99.7 kg)   SpO2 92%   BMI 38.94 kg/m  , BMI Body mass index is 38.94 kg/m. GEN: Well nourished, well developed, in no acute distress  HEENT: normal  Neck: no JVD, carotid bruits, or masses Cardiac: RRR; no murmurs, rubs, or gallops,no edema  Respiratory:  clear to auscultation bilaterally, normal work of breathing GI: soft, nontender, nondistended, + BS MS: no deformity or atrophy  Skin: warm and dry  Neuro:  Strength and sensation are intact Psych: euthymic mood, full affect  EKG:  EKG is ordered today. The ekg ordered today is personally reviewed and shows sinus rhythm 92 bpm, PR 174 msec, QRS 90 msec, QTc 447 msec, PACs   Recent Labs: 01/09/2018: Magnesium 1.2; TSH 3.797 02/08/2018: ALT 13; BUN 16; Creatinine 0.89; Hemoglobin 11.5; Platelet Count 134; Potassium 5.2; Sodium 142  personally reviewed   Lipid Panel     Component Value Date/Time   CHOL 153 12/23/2008 1019   TRIG 92 12/23/2008 1019   HDL 60 12/23/2008 1019   CHOLHDL 2.6 12/23/2008 1019   VLDL 18 12/23/2008 1019   LDLCALC 75 12/23/2008 1019   personally reviewed   Wt Readings from Last 3 Encounters:  02/09/18 219 lb 12.8 oz (99.7 kg)  02/08/18 223 lb 12.8 oz (101.5 kg)  01/16/18 228 lb (103.4 kg)      Other studies personally reviewed: Additional studies/ records that were reviewed today include: ecgs from  01/09/18, ed visit notes, AF clinic notes  Review of the above records today demonstrates: as above   ASSESSMENT AND PLAN:  1.  Typical atrial flutter Single episode 01/09/18 chads2vasc score is 3.  She has been started on eliquis. We discussed ablation at length today.  She is not certain that she would like to proceed.  She and her spouse will think about this and contact my office if she decides to proceed.  She will continue anticoagulation at this time.  2. EF 45% New finding on recent echo Given inferolateral wall HK, could be ischemic in nature. She also  reports having chemo "20 years ago" and is not certain as to which agents she received. I will refer to Dr Margaretann Loveless for further evaluation and management.  Follow-up:  Return to see me as needed Refer to Dr Margaretann Loveless general cardiology as above  Current medicines are reviewed at length with the patient today.   The patient does not have concerns regarding her medicines.  The following changes were made today:  none  Signed,  Thompson Grayer, MD  02/09/2018 11:01 AM     [facility]  [address] [place] [address] [phone] (office) [phone] (fax)

## 2018-02-09 NOTE — Patient Instructions (Addendum)
Medication Instructions:  Your physician recommends that you continue on your current medications as directed. Please refer to the Current Medication list given to you today.  Labwork: None ordered.  Testing/Procedures: Your physician has recommended that you have an ablation. Catheter ablation is a medical procedure used to treat some cardiac arrhythmias (irregular heartbeats). During catheter ablation, a long, thin, flexible tube is put into a blood vessel in your groin (upper thigh), or neck. This tube is called an ablation catheter. It is then guided to your heart through the blood vessel. Radio frequency waves destroy small areas of heart tissue where abnormal heartbeats may cause an arrhythmia to start. Please see the instruction sheet given to you today.  If you decide you want to go ahead with an ablation please give me a call:  Cristopher Estimable RN for Dr. Rayann Heman at [phone]  Follow-Up: Your physician wants you to follow-up in: as needed with Dr. Rayann Heman.     We are referring you to general cardiologist Dr. Margaretann Loveless at the [facility] office.  They will call you to schedule an appointment.   Any Other Special Instructions Will Be Listed Below (If Applicable).  If you need a refill on your cardiac medications before your next appointment, please call your pharmacy.

## 2018-02-20 ENCOUNTER — Ambulatory Visit (HOSPITAL_COMMUNITY): Payer: Medicare Other | Admitting: Nurse Practitioner

## 2018-02-21 ENCOUNTER — Encounter: Payer: Self-pay | Admitting: Internal Medicine

## 2018-02-21 ENCOUNTER — Ambulatory Visit: Payer: Medicare Other | Admitting: Internal Medicine

## 2018-02-21 VITALS — BP 145/77 | HR 88 | Ht 63.5 in | Wt 221.2 lb

## 2018-02-21 DIAGNOSIS — R9389 Abnormal findings on diagnostic imaging of other specified body structures: Secondary | ICD-10-CM

## 2018-02-21 DIAGNOSIS — I483 Typical atrial flutter: Secondary | ICD-10-CM

## 2018-02-21 NOTE — Progress Notes (Signed)
Cardiology Office Note:    Date:  02/21/2018   ID:  Brittany Porter, DOB Mar 16, 1946, MRN [id number]  PCP:  [name], MD  Cardiologist:  No primary care provider on file.  Electrophysiologist:  None   Referring MD: Thompson Grayer, MD   Atrial Flutter, reduced ejection fraction with regional wall motion abnormalities.  History of Present Illness:    Brittany Porter is a 72 y.o. female with a hx of breast cancer, endometrial cancer, melanoma and ovarian cancer. She is on maintenance therapy for breast cancer. She was seen in August and September by my EP colleague Dr. Rayann Heman for atrial flutter.  During her initial consult in the ED, she was found to be in atrial flutter, which she noted because she takes her HR and blood pressure daily, and noticed that her HR was high. She was asymptomatic. She was given diltiazem and subsequently apixaban, which she has continued.  She denies chest pain, palpitations, PND, orthopnea, or significant leg swelling.  She does get medial right knee swelling after prolonged standing which is likely related to degenerative joint disease.  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  After prolonged standing she notes that she is short of breath, and her church members try to ensure that she has a stool to sit on so that she does not get overly fatigued.   Her echocardiogram from 01/26/2018 demonstrates a mildly reduced ejection fraction, estimated at 40 to 45%, and hypokinesis of the inferolateral myocardium.  I have independently reviewed the images from this echocardiogram.  She has a recent nuclear medicine PET scan from 02/05/2018, and I have reviewed these images for coronary artery calcium.  She does have dense coronary artery calcifications in the proximal LAD.  She states that her main concern is feeling of fatigue on a daily basis and wishing  she had more energy.  She also struggles with joint pain and the mobility limitations as a result. Past Medical History:  Diagnosis Date  . Arthritis   . Breast cancer ([place])   . Diabetes mellitus   . Endometrial carcinoma ([place]) 10/2008  . Fluid overload   . History of radiation therapy 04/13/2009, 04/16/2009, 04/27/2009, 05/07/2009, 05/18/2009   3000 cGy to proximal vagina  . Hyperlipidemia   . Iron deficiency   . Melanoma ([place]) 2010   rt arm and back  . Ovarian cancer ([place])   . Pleural effusion 04/2010    Past Surgical History:  Procedure Laterality Date  . ABDOMINAL HYSTERECTOMY  10/2008  . INSERTION OF MESH  05/28/2012   Procedure: INSERTION OF MESH;  Surgeon: Adin Hector, MD;  Location: [facility];  Service: General;  Laterality: N/A;  . KNEE ARTHROSCOPY  04/2010   Right knee  . LAPAROSCOPIC LYSIS OF ADHESIONS  05/28/2012   Procedure: LAPAROSCOPIC LYSIS OF ADHESIONS;  Surgeon: Adin Hector, MD;  Location: [facility];  Service: General;  Laterality: N/A;  . MASTECTOMY  1997   Bilateral with lymph nodes  . Melanoma removal  02/2009, 07/2009  . VENTRAL HERNIA REPAIR  05/28/2012   Procedure: LAPAROSCOPIC VENTRAL HERNIA;  Surgeon: Adin Hector, MD;  Location: [facility];  Service: General;  Laterality: N/A;    Current Medications: Current Meds  Medication Sig  . apixaban (ELIQUIS) 5 MG TABS tablet Take 1 tablet (5 mg total) by mouth 2 (two) times daily.  Marland Kitchen  Calcium Carb-Cholecalciferol (CALCIUM 600+D3) 600-800 MG-UNIT TABS Take 1 tablet by mouth daily.   . Cholecalciferol (VITAMIN D3) 2000 UNITS TABS Take 2,000 mg by mouth daily.  . ferrous sulfate 325 (65 FE) MG tablet Take 325 mg by mouth daily with breakfast.  . fish oil-omega-3 fatty acids 1000 MG capsule Take 1 g by mouth 2 (two) times daily.   . furosemide (LASIX) 20 MG tablet Take 20 mg by mouth daily as needed for fluid or edema. Reported on 05/07/2015  . glimepiride (AMARYL) 2 MG tablet Take 2 mg by mouth daily with breakfast.    . IBRANCE 125 MG capsule TAKE 1 CAPSULE (125 MG TOTAL) BY MOUTH DAILY WITH BREAKFAST. TAKE FOR 21 DAYS ON, 7 DAYS OFF. TAKE WHOLE WITH FOOD. (Patient taking differently: Take 125 mg by mouth daily with breakfast. )  . ibuprofen (ADVIL,MOTRIN) 200 MG tablet Take 400 mg by mouth every 6 (six) hours as needed for headache (pain). Reported on 11/12/2015  . letrozole (FEMARA) 2.5 MG tablet Take 1 tablet (2.5 mg total) by mouth daily.  . metFORMIN (GLUCOPHAGE) 500 MG tablet Take 500 mg by mouth 2 (two) times daily with a meal.    . simvastatin (ZOCOR) 40 MG tablet Take 40 mg by mouth at bedtime.       Allergies:   Sulfa antibiotics   Social History   Socioeconomic History  . Marital status: Married    Spouse name: Not on file  . Number of children: Not on file  . Years of education: Not on file  . Highest education level: Not on file  Occupational History  . Not on file  Social Needs  . Financial resource strain: Not on file  . Food insecurity:    Worry: Not on file    Inability: Not on file  . Transportation needs:    Medical: Not on file    Non-medical: Not on file  Tobacco Use  . Smoking status: Never Smoker  . Smokeless tobacco: Never Used  Substance and Sexual Activity  . Alcohol use: No  . Drug use: No  . Sexual activity: Yes  Lifestyle  . Physical activity:    Days per week: Not on file    Minutes per session: Not on file  . Stress: Not on file  Relationships  . Social connections:    Talks on phone: Not on file    Gets together: Not on file    Attends religious service: Not on file    Active member of club or organization: Not on file    Attends meetings of clubs or organizations: Not on file    Relationship status: Not on file  Other Topics Concern  . Not on file  Social History Narrative  . Not on file     Family History: The patient's family history includes Cancer in her brother, father, sister, and sister; Colon cancer in her sister; Heart attack in her  brother; Skin cancer in her brother; Stroke in her mother and sister.  ROS:   Please see the history of present illness.    All other systems reviewed and are negative.  EKGs/Labs/Other Studies Reviewed:    The following studies were reviewed today: Echocardiogram 01/26/2018  Serial ECGs   EKG:  EKG is ordered today.  The ekg ordered today demonstrates sinus rhythm with a ventricular rate of 90.  Recent Labs: 01/09/2018: Magnesium 1.2; TSH 3.797 02/08/2018: ALT 13; BUN 16; Creatinine 0.89; Hemoglobin 11.5; Platelet Count 134; Potassium  5.2; Sodium 142  Recent Lipid Panel    Component Value Date/Time   CHOL 153 12/23/2008 1019   TRIG 92 12/23/2008 1019   HDL 60 12/23/2008 1019   CHOLHDL 2.6 12/23/2008 1019   VLDL 18 12/23/2008 1019   LDLCALC 75 12/23/2008 1019    Physical Exam:    VS:  BP (!) 145/77   Pulse 88   Ht 5' 3.5" (1.613 m)   Wt 221 lb 3.2 oz (100.3 kg)   SpO2 98%   BMI 38.57 kg/m     Wt Readings from Last 3 Encounters:  02/21/18 221 lb 3.2 oz (100.3 kg)  02/09/18 219 lb 12.8 oz (99.7 kg)  02/08/18 223 lb 12.8 oz (101.5 kg)     GEN: Well nourished, well developed in no acute distress HEENT: Normal NECK: No JVD; No carotid bruits CARDIAC: RRR, no murmurs, rubs, gallops RESPIRATORY:  Clear to auscultation without rales, wheezing or rhonchi  ABDOMEN: Soft, non-tender, non-distended MUSCULOSKELETAL:  No edema; No deformity  SKIN: Warm and dry NEUROLOGIC:  Alert and oriented x 3 PSYCHIATRIC:  Normal affect   ASSESSMENT:    1. Abnormal ventricular wall motion   2. Typical atrial flutter (H[facility])    PLAN:    In order of problems listed above:  She has a reduced ejection fraction with regional wall motion abnormalities.  She has coronary calcifications in her proximal LAD seen on her most recent nuclear medicine PET scan.  With this in mind and her concern of fatigue and low energy, it may be reasonable to consider regadenoson sestamibi stress test to  evaluate for ischemia as a source of her reduced ejection fraction.  She does have a history of receiving chemotherapy, if there is no inducible ischemia or fixed infarct, consideration for chemotherapy effect could be considered.  With her regional wall motion abnormalities, I am more concerned about ruling out significant coronary artery disease.  I will follow-up with Brittany Porter in 4 to 6 weeks, or sooner with the results of her stress test.  I have described in detail the risks, benefits, and rationale behind coronary angiography.  After stress test results are available, we will have a more informed discussion about coronary angiography and if that would be of any benefit to her.   Medication Adjustments/Labs and Tests Ordered: Current medicines are reviewed at length with the patient today.  Concerns regarding medicines are outlined above.  No orders of the defined types were placed in this encounter.  No orders of the defined types were placed in this encounter.   There are no Patient Instructions on file for this visit.   Signed, Elouise Munroe, MD  02/21/2018 2:04 PM    [place]

## 2018-02-21 NOTE — Patient Instructions (Signed)
Medication Instructions:  Your physician recommends that you continue on your current medications as directed. Please refer to the Current Medication list given to you today.  If you need a refill on your cardiac medications before your next appointment, please call your pharmacy.   Lab work: None ordered  Testing/Procedures: Your physician has requested that you have a lexiscan myoview. For further information please visit [email or web address]. Please follow instruction sheet, as given.    Follow-Up: At [facility], you and your health needs are our priority.  As part of our continuing mission to provide you with exceptional heart care, we have created designated Provider Care Teams.  These Care Teams include your primary Cardiologist (physician) and Advanced Practice Providers (APPs -  Physician Assistants and Nurse Practitioners) who all work together to provide you with the care you need, when you need it.  You will need a follow up appointment in 2 weeks.

## 2018-02-22 ENCOUNTER — Encounter (HOSPITAL_COMMUNITY): Payer: Self-pay | Admitting: Hematology and Oncology

## 2018-02-22 NOTE — Addendum Note (Signed)
Addended by: Zebedee Iba on: 02/22/2018 04:08 PM   Modules accepted: Orders

## 2018-02-23 ENCOUNTER — Inpatient Hospital Stay: Payer: Medicare Other | Attending: Hematology and Oncology

## 2018-02-23 DIAGNOSIS — C7951 Secondary malignant neoplasm of bone: Secondary | ICD-10-CM | POA: Insufficient documentation

## 2018-02-23 DIAGNOSIS — Z79899 Other long term (current) drug therapy: Secondary | ICD-10-CM | POA: Diagnosis not present

## 2018-02-23 DIAGNOSIS — Z5111 Encounter for antineoplastic chemotherapy: Secondary | ICD-10-CM | POA: Insufficient documentation

## 2018-02-23 DIAGNOSIS — C782 Secondary malignant neoplasm of pleura: Secondary | ICD-10-CM | POA: Insufficient documentation

## 2018-02-23 DIAGNOSIS — C50911 Malignant neoplasm of unspecified site of right female breast: Secondary | ICD-10-CM

## 2018-02-23 DIAGNOSIS — C50919 Malignant neoplasm of unspecified site of unspecified female breast: Secondary | ICD-10-CM | POA: Diagnosis present

## 2018-02-23 DIAGNOSIS — R530 Neoplastic (malignant) related fatigue: Secondary | ICD-10-CM | POA: Diagnosis not present

## 2018-02-23 DIAGNOSIS — D709 Neutropenia, unspecified: Secondary | ICD-10-CM | POA: Diagnosis not present

## 2018-02-23 DIAGNOSIS — C787 Secondary malignant neoplasm of liver and intrahepatic bile duct: Secondary | ICD-10-CM | POA: Insufficient documentation

## 2018-02-23 LAB — CBC WITH DIFFERENTIAL (CANCER CENTER ONLY)
Abs Immature Granulocytes: 0.03 10*3/uL (ref 0.00–0.07)
Basophils Absolute: 0.1 10*3/uL (ref 0.0–0.1)
Basophils Relative: 2 %
Eosinophils Absolute: 0 10*3/uL (ref 0.0–0.5)
Eosinophils Relative: 0 %
HCT: 35 % — ABNORMAL LOW (ref 36.0–46.0)
Hemoglobin: 11.7 g/dL — ABNORMAL LOW (ref 12.0–15.0)
Immature Granulocytes: 1 %
Lymphocytes Relative: 20 %
Lymphs Abs: 0.7 10*3/uL (ref 0.7–4.0)
MCH: 34.9 pg — ABNORMAL HIGH (ref 26.0–34.0)
MCHC: 33.4 g/dL (ref 30.0–36.0)
MCV: 104.5 fL — ABNORMAL HIGH (ref 80.0–100.0)
Monocytes Absolute: 0.2 10*3/uL (ref 0.1–1.0)
Monocytes Relative: 6 %
Neutro Abs: 2.3 10*3/uL (ref 1.7–7.7)
Neutrophils Relative %: 71 %
Platelet Count: 242 10*3/uL (ref 150–400)
RBC: 3.35 MIL/uL — ABNORMAL LOW (ref 3.87–5.11)
RDW: 14.1 % (ref 11.5–15.5)
WBC Count: 3.2 10*3/uL — ABNORMAL LOW (ref 4.0–10.5)
nRBC: 0 % (ref 0.0–0.2)

## 2018-02-23 LAB — CMP (CANCER CENTER ONLY)
ALT: 9 U/L (ref 0–44)
AST: 18 U/L (ref 15–41)
Albumin: 3.3 g/dL — ABNORMAL LOW (ref 3.5–5.0)
Alkaline Phosphatase: 47 U/L (ref 38–126)
Anion gap: 11 (ref 5–15)
BUN: 12 mg/dL (ref 8–23)
CO2: 29 mmol/L (ref 22–32)
Calcium: 9.3 mg/dL (ref 8.9–10.3)
Chloride: 103 mmol/L (ref 98–111)
Creatinine: 0.79 mg/dL (ref 0.44–1.00)
GFR, Est AFR Am: >60 mL/min (ref >60)
GFR, Estimated: >60 mL/min (ref >60)
Glucose, Bld: 161 mg/dL — ABNORMAL HIGH (ref 70–99)
Potassium: 5.2 mmol/L — ABNORMAL HIGH (ref 3.5–5.1)
Sodium: 143 mmol/L (ref 135–145)
Total Bilirubin: 0.9 mg/dL (ref 0.3–1.2)
Total Protein: 6.7 g/dL (ref 6.5–8.1)

## 2018-02-28 ENCOUNTER — Other Ambulatory Visit (HOSPITAL_COMMUNITY): Payer: Self-pay | Admitting: *Deleted

## 2018-02-28 ENCOUNTER — Telehealth (HOSPITAL_COMMUNITY): Payer: Self-pay | Admitting: *Deleted

## 2018-02-28 MED ORDER — APIXABAN 5 MG PO TABS: 5.0000 mg | ORAL_TABLET | Freq: Two times a day (BID) | ORAL | 5 refills | Status: DC

## 2018-02-28 NOTE — Telephone Encounter (Signed)
She will need at least 1 refill sent in so that we can overlap Eliquis and warfarin for 3 days before she continues on warfarin.  Called pt and she would like Eliquis rx sent to pharmacy to determine her copay. I left a few weeks of samples up front as well for pt. She thinks she will only be on Eliquis short term for potential ablation and will try to afford medication for now. She will call clinic with any concerns.

## 2018-02-28 NOTE — Telephone Encounter (Addendum)
Patient called in this morning stating she wants to switch to something cheaper than Eliquis as her copay with Eliquis will be $45 a month. Pt took her last dose last PM and would like to switch before needing to buy if she can. Pt states the Eliquis upsets her stomach as well. Message sent to coumadin clinic to see if change can be made today or if I need to send in refills of Eliquis for filling.  Pt states [phone] is best contact today.

## 2018-03-01 ENCOUNTER — Telehealth (HOSPITAL_COMMUNITY): Payer: Self-pay

## 2018-03-01 NOTE — Telephone Encounter (Signed)
Encounter complete. 

## 2018-03-06 ENCOUNTER — Ambulatory Visit (HOSPITAL_COMMUNITY)
Admission: RE | Admit: 2018-03-06 | Discharge: 2018-03-06 | Disposition: A | Discharge status: Home / Self Care | Payer: Medicare Other | Source: Ambulatory Visit | Attending: Cardiology | Admitting: Cardiology

## 2018-03-06 DIAGNOSIS — R9389 Abnormal findings on diagnostic imaging of other specified body structures: Secondary | ICD-10-CM | POA: Diagnosis present

## 2018-03-06 LAB — MYOCARDIAL PERFUSION IMAGING
LV dias vol: 108 mL (ref 46–106)
LV sys vol: 59 mL
Peak HR: 117 {beats}/min
Rest HR: 80 {beats}/min
SDS: 3
SRS: 17
SSS: 20
TID: 1.14

## 2018-03-06 MED ORDER — REGADENOSON 0.4 MG/5ML IV SOLN
0.4000 mg | Freq: Once | INTRAVENOUS | Status: AC
  Administered 2018-03-06: 0.4 mg via INTRAVENOUS

## 2018-03-06 MED ORDER — TECHNETIUM TC 99M TETROFOSMIN IV KIT
31.0000 | PACK | Freq: Once | INTRAVENOUS | Status: AC | PRN
  Administered 2018-03-06: 31 via INTRAVENOUS
  Filled 2018-03-06: qty 31

## 2018-03-06 MED ORDER — TECHNETIUM TC 99M TETROFOSMIN IV KIT
9.9000 | PACK | Freq: Once | INTRAVENOUS | Status: AC | PRN
  Administered 2018-03-06: 9.9 via INTRAVENOUS
  Filled 2018-03-06: qty 10

## 2018-03-07 ENCOUNTER — Ambulatory Visit: Payer: Medicare Other

## 2018-03-07 ENCOUNTER — Inpatient Hospital Stay: Payer: Medicare Other

## 2018-03-07 ENCOUNTER — Telehealth: Payer: Self-pay

## 2018-03-07 ENCOUNTER — Other Ambulatory Visit: Payer: Medicare Other

## 2018-03-07 ENCOUNTER — Ambulatory Visit: Payer: Medicare Other | Admitting: Hematology and Oncology

## 2018-03-07 ENCOUNTER — Inpatient Hospital Stay (HOSPITAL_BASED_OUTPATIENT_CLINIC_OR_DEPARTMENT_OTHER): Payer: Medicare Other | Admitting: Hematology and Oncology

## 2018-03-07 ENCOUNTER — Encounter (HOSPITAL_COMMUNITY): Payer: Medicare Other

## 2018-03-07 ENCOUNTER — Telehealth: Payer: Self-pay | Admitting: Hematology and Oncology

## 2018-03-07 VITALS — BP 136/93 | HR 101 | Temp 98.1°F | Resp 17 | Ht 64.0 in | Wt 217.9 lb

## 2018-03-07 DIAGNOSIS — D709 Neutropenia, unspecified: Secondary | ICD-10-CM

## 2018-03-07 DIAGNOSIS — C7951 Secondary malignant neoplasm of bone: Secondary | ICD-10-CM | POA: Diagnosis not present

## 2018-03-07 DIAGNOSIS — C50919 Malignant neoplasm of unspecified site of unspecified female breast: Secondary | ICD-10-CM

## 2018-03-07 DIAGNOSIS — C782 Secondary malignant neoplasm of pleura: Secondary | ICD-10-CM

## 2018-03-07 DIAGNOSIS — Z5111 Encounter for antineoplastic chemotherapy: Secondary | ICD-10-CM | POA: Diagnosis not present

## 2018-03-07 DIAGNOSIS — R530 Neoplastic (malignant) related fatigue: Secondary | ICD-10-CM

## 2018-03-07 DIAGNOSIS — C787 Secondary malignant neoplasm of liver and intrahepatic bile duct: Principal | ICD-10-CM

## 2018-03-07 DIAGNOSIS — I2510 Atherosclerotic heart disease of native coronary artery without angina pectoris: Secondary | ICD-10-CM

## 2018-03-07 DIAGNOSIS — R5383 Other fatigue: Secondary | ICD-10-CM

## 2018-03-07 DIAGNOSIS — C50911 Malignant neoplasm of unspecified site of right female breast: Secondary | ICD-10-CM

## 2018-03-07 LAB — CBC WITH DIFFERENTIAL (CANCER CENTER ONLY)
Abs Immature Granulocytes: 0.02 10*3/uL (ref 0.00–0.07)
Basophils Absolute: 0 10*3/uL (ref 0.0–0.1)
Basophils Relative: 1 %
Eosinophils Absolute: 0 10*3/uL (ref 0.0–0.5)
Eosinophils Relative: 1 %
HCT: 34.7 % — ABNORMAL LOW (ref 36.0–46.0)
Hemoglobin: 11.4 g/dL — ABNORMAL LOW (ref 12.0–15.0)
Immature Granulocytes: 1 %
Lymphocytes Relative: 21 %
Lymphs Abs: 0.6 10*3/uL — ABNORMAL LOW (ref 0.7–4.0)
MCH: 34.2 pg — ABNORMAL HIGH (ref 26.0–34.0)
MCHC: 32.9 g/dL (ref 30.0–36.0)
MCV: 104.2 fL — ABNORMAL HIGH (ref 80.0–100.0)
Monocytes Absolute: 0.2 10*3/uL (ref 0.1–1.0)
Monocytes Relative: 7 %
Neutro Abs: 1.9 10*3/uL (ref 1.7–7.7)
Neutrophils Relative %: 69 %
Platelet Count: 145 10*3/uL — ABNORMAL LOW (ref 150–400)
RBC: 3.33 MIL/uL — ABNORMAL LOW (ref 3.87–5.11)
RDW: 14.1 % (ref 11.5–15.5)
WBC Count: 2.8 10*3/uL — ABNORMAL LOW (ref 4.0–10.5)
nRBC: 0 % (ref 0.0–0.2)

## 2018-03-07 LAB — CMP (CANCER CENTER ONLY)
ALT: 9 U/L (ref 0–44)
AST: 20 U/L (ref 15–41)
Albumin: 3.4 g/dL — ABNORMAL LOW (ref 3.5–5.0)
Alkaline Phosphatase: 43 U/L (ref 38–126)
Anion gap: 11 (ref 5–15)
BUN: 13 mg/dL (ref 8–23)
CO2: 27 mmol/L (ref 22–32)
Calcium: 8.8 mg/dL — ABNORMAL LOW (ref 8.9–10.3)
Chloride: 101 mmol/L (ref 98–111)
Creatinine: 0.82 mg/dL (ref 0.44–1.00)
GFR, Est AFR Am: >60 mL/min (ref >60)
GFR, Estimated: >60 mL/min (ref >60)
Glucose, Bld: 166 mg/dL — ABNORMAL HIGH (ref 70–99)
Potassium: 4.2 mmol/L (ref 3.5–5.1)
Sodium: 139 mmol/L (ref 135–145)
Total Bilirubin: 1 mg/dL (ref 0.3–1.2)
Total Protein: 6.7 g/dL (ref 6.5–8.1)

## 2018-03-07 MED ORDER — FULVESTRANT 250 MG/5ML IM SOLN
INTRAMUSCULAR | Status: AC
  Filled 2018-03-07: qty 10

## 2018-03-07 MED ORDER — FULVESTRANT 250 MG/5ML IM SOLN
500.0000 mg | Freq: Once | INTRAMUSCULAR | Status: AC
  Administered 2018-03-07: 500 mg via INTRAMUSCULAR

## 2018-03-07 MED FILL — IBRANCE 125 MG CAPSULE: 125 | 28 days supply | Qty: 21 | Fill #2

## 2018-03-07 NOTE — Progress Notes (Signed)
Per [place] request for molecular test to [facility].

## 2018-03-07 NOTE — Telephone Encounter (Signed)
Called to give pt myoview results and Dr.Acharya's recommendation. Called both tel numbers listed for pt, both phone numbers rang out. Unable to lmom. Will attempt again.

## 2018-03-07 NOTE — Telephone Encounter (Signed)
Gave avs and calendar ° °

## 2018-03-07 NOTE — Patient Instructions (Signed)
Fulvestrant (Faslodex) injection What is this medicine? FULVESTRANT (ful VES trant) blocks the effects of estrogen. It is used to treat breast cancer. This medicine may be used for other purposes; ask your health care provider or pharmacist if you have questions. COMMON BRAND NAME(S): FASLODEX What should I tell my health care provider before I take this medicine? They need to know if you have any of these conditions: -bleeding problems -liver disease -low levels of platelets in the blood -an unusual or allergic reaction to fulvestrant, other medicines, foods, dyes, or preservatives -pregnant or trying to get pregnant -breast-feeding How should I use this medicine? This medicine is for injection into a muscle. It is usually given by a health care professional in a hospital or clinic setting. Talk to your pediatrician regarding the use of this medicine in children. Special care may be needed. Overdosage: If you think you have taken too much of this medicine contact a poison control center or emergency room at once. NOTE: This medicine is only for you. Do not share this medicine with others. What if I miss a dose? It is important not to miss your dose. Call your doctor or health care professional if you are unable to keep an appointment. What may interact with this medicine? -medicines that treat or prevent blood clots like warfarin, enoxaparin, and dalteparin This list may not describe all possible interactions. Give your health care provider a list of all the medicines, herbs, non-prescription drugs, or dietary supplements you use. Also tell them if you smoke, drink alcohol, or use illegal drugs. Some items may interact with your medicine. What should I watch for while using this medicine? Your condition will be monitored carefully while you are receiving this medicine. You will need important blood work done while you are taking this medicine. Do not become pregnant while taking this  medicine or for at least 1 year after stopping it. Women of child-bearing potential will need to have a negative pregnancy test before starting this medicine. Women should inform their doctor if they wish to become pregnant or think they might be pregnant. There is a potential for serious side effects to an unborn child. Men should inform their doctors if they wish to father a child. This medicine may lower sperm counts. Talk to your health care professional or pharmacist for more information. Do not breast-feed an infant while taking this medicine or for 1 year after the last dose. What side effects may I notice from receiving this medicine? Side effects that you should report to your doctor or health care professional as soon as possible: -allergic reactions like skin rash, itching or hives, swelling of the face, lips, or tongue -feeling faint or lightheaded, falls -pain, tingling, numbness, or weakness in the legs -signs and symptoms of infection like fever or chills; cough; flu-like symptoms; sore throat -vaginal bleeding Side effects that usually do not require medical attention (report to your doctor or health care professional if they continue or are bothersome): -aches, pains -constipation -diarrhea -headache -hot flashes -nausea, vomiting -pain at site where injected -stomach pain This list may not describe all possible side effects. Call your doctor for medical advice about side effects. You may report side effects to FDA at 1-800-FDA-1088. Where should I keep my medicine? This drug is given in a hospital or clinic and will not be stored at home. NOTE: This sheet is a summary. It may not cover all possible information. If you have questions about this medicine, talk to  your doctor, pharmacist, or health care provider.  2018 Elsevier/Gold Standard (2014-11-28 11:03:55)

## 2018-03-07 NOTE — Telephone Encounter (Signed)
Received call form [facility] [employer] [facility] about requesting a new specimen block to run next generation sequencing test. They had used up all available block from pathology to run the first requested test that Dr.Gudena had ordered. Due to limited sample, they were unable to run next generation sequencing. Notified Dr.Gudena and is aware.

## 2018-03-07 NOTE — Assessment & Plan Note (Signed)
Metastatic breast cancer: recently progressive in liver and bone, also pleura. OnFaslodex + Ibrance beginning 03/31/2016  Toxicities with therapy: 1. Neutropenia: Mild does not require dose adjustment. 2. injection site discomfort very mild 3. Fatigue related to Ibrance  PET CT scan 02/05/2018: Overall mild progression of malignancy.  Nodularity left lung base SUV 9.2 previously 8.4.  The hepatic metastatic lesions are increased in activity with a new segment 6 liver lesion.  Skeletal lesions are increased metabolic activity and some lesions appear larger, left pleural thickening with left pleural effusion, right lower paratracheal lymph node slightly smaller  [facility] molecular testing revealed androgen receptor positivity and negative for PD-L1. 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

## 2018-03-07 NOTE — Progress Notes (Signed)
Patient Care Team: [name], MD as PCP - General (Family Medicine)  DIAGNOSIS:  Encounter Diagnosis  Name Primary?  . Carcinoma of breast metastatic to bone, unspecified laterality ([place]) Yes    SUMMARY OF ONCOLOGIC HISTORY:   Breast cancer metastasized to liver [facility])   1997 Initial Diagnosis    bilateral breast cancers 1997 treated with bilateral mastectomies and axillary node dissections, adjuvant chemotherapy and radiation.    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 BRCA reportedly normal.    08/25/2015 Relapse/Recurrence    new solitary liver lesion, biopsy showing metastatic ER PR + breast    09/25/2015 - 03/11/2016 Chemotherapy    Xeloda. Stopped due to progression of liver and bone metastases; pleural metastases    03/31/2016 -  Anti-estrogen oral therapy    Faslodex plus Ibrance (along with Xgeva)     Carcinoma of breast metastatic to bone ([place])   03/27/2016 Initial Diagnosis    Carcinoma of breast metastatic to bone ([place])    08/03/2017 -  Chemotherapy    The patient had fulvestrant (FASLODEX) injection 500 mg, 500 mg, Intramuscular,  Once, 7 of 7 cycles Administration: 500 mg (08/03/2017), 500 mg (08/31/2017), 500 mg (10/11/2017), 500 mg (11/08/2017), 500 mg (12/07/2017), 500 mg (01/04/2018)  for chemotherapy treatment.      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  Unfortunately there was not enough material to run next generation sequencing.  They were only able to do immunohistochemistry studies which revealed that she has Lynch syndrome genes as well as androgen receptor abnormalities suggestive of response to antiandrogen therapy.  There was also a problem with the BRCA mutation testing.  We do not have either of these results today to determine her treatment plan.  She is complaining of having fatigue as a result of Ibrance.  Other than that she is doing well.  REVIEW OF SYSTEMS:   Constitutional: Complains of fatigue Eyes: Denies blurriness of vision Ears, nose, mouth, throat, and face: Denies mucositis or sore throat Respiratory: Denies cough, dyspnea or wheezes Cardiovascular: Denies palpitation, chest discomfort Gastrointestinal:  Denies nausea, heartburn or change in bowel habits Skin: Denies abnormal skin rashes Lymphatics: Denies new lymphadenopathy or easy bruising Neurological:Denies numbness, tingling or new weaknesses Behavioral/Psych: Mood is stable, no new changes  Extremities: No lower extremity edema   All other systems were reviewed with the patient and are negative.  I have reviewed the past medical history, past surgical history, social history and family history with the patient and they are unchanged from previous note.  ALLERGIES:  is allergic to sulfa antibiotics.  MEDICATIONS:  Current Outpatient Medications  Medication Sig Dispense Refill  . apixaban (ELIQUIS) 5 MG TABS tablet Take 1 tablet (5 mg total) by mouth 2 (two) times daily. 60 tablet 5  .  Calcium Carb-Cholecalciferol (CALCIUM 600+D3) 600-800 MG-UNIT TABS Take 1 tablet by mouth daily.     . Cholecalciferol (VITAMIN D3) 2000 UNITS TABS Take 2,000 mg by mouth daily.    . ferrous sulfate 325 (65 FE) MG tablet Take  325 mg by mouth daily with breakfast.    . fish oil-omega-3 fatty acids 1000 MG capsule Take 1 g by mouth 2 (two) times daily.     . furosemide (LASIX) 20 MG tablet Take 20 mg by mouth daily as needed for fluid or edema. Reported on 05/07/2015    . glimepiride (AMARYL) 2 MG tablet Take 2 mg by mouth daily with breakfast.    . IBRANCE 125 MG capsule TAKE 1 CAPSULE (125 MG TOTAL) BY MOUTH DAILY WITH BREAKFAST. TAKE FOR 21 DAYS ON, 7 DAYS OFF. TAKE WHOLE WITH FOOD. (Patient taking differently: Take 125 mg by mouth daily with breakfast. ) 21 capsule 2  . ibuprofen (ADVIL,MOTRIN) 200 MG tablet Take 400 mg by mouth every 6 (six) hours as needed for headache (pain). Reported on 11/12/2015    . letrozole (FEMARA) 2.5 MG tablet Take 1 tablet (2.5 mg total) by mouth daily. 90 tablet 3  . metFORMIN (GLUCOPHAGE) 500 MG tablet Take 500 mg by mouth 2 (two) times daily with a meal.      . simvastatin (ZOCOR) 40 MG tablet Take 40 mg by mouth at bedtime.       No current facility-administered medications for this visit.     PHYSICAL EXAMINATION: ECOG PERFORMANCE STATUS: 1 - Symptomatic but completely ambulatory  Vitals:   03/07/18 1048  BP: (!) 136/93  Pulse: (!) 101  Resp: 17  Temp: 98.1 F (36.7 C)  SpO2: 94%   Filed Weights   03/07/18 1048  Weight: 217 lb 14.4 oz (98.8 kg)    GENERAL:alert, no distress and comfortable SKIN: skin color, texture, turgor are normal, no rashes or significant lesions EYES: normal, Conjunctiva are pink and non-injected, sclera clear OROPHARYNX:no exudate, no erythema and lips, buccal mucosa, and tongue normal  NECK: supple, thyroid normal size, non-tender, without nodularity LYMPH:  no palpable lymphadenopathy in the cervical, axillary or inguinal LUNGS: clear to auscultation and percussion with normal breathing effort HEART: regular rate & rhythm and no murmurs and no lower extremity edema ABDOMEN:abdomen soft, non-tender and normal bowel sounds MUSCULOSKELETAL:no  cyanosis of digits and no clubbing  NEURO: alert & oriented x 3 with fluent speech, no focal motor/sensory deficits EXTREMITIES: No lower extremity edema   LABORATORY DATA:  I have reviewed the data as listed CMP Latest Ref Rng & Units 02/23/2018 02/08/2018 01/09/2018  Glucose 70 - 99 mg/dL 161(H) 186(H) 220(H)  BUN 8 - 23 mg/dL 12 16 35(H)  Creatinine 0.44 - 1.00 mg/dL 0.79 0.89 1.19(H)  Sodium 135 - 145 mmol/L 143 142 142  Potassium 3.5 - 5.1 mmol/L 5.2(H) 5.2(H) 5.2(H)  Chloride 98 - 111 mmol/L 103 103 106  CO2 22 - 32 mmol/L '29 30 27  ' Calcium 8.9 - 10.3 mg/dL 9.3 9.6 8.6(L)  Total Protein 6.5 - 8.1 g/dL 6.7 6.8 -  Total Bilirubin 0.3 - 1.2 mg/dL 0.9 0.9 -  Alkaline Phos 38 - 126 U/L 47 41 -  AST 15 - 41 U/L 18 20 -  ALT 0 - 44 U/L 9 13 -    Lab Results  Component Value Date   WBC 2.8 (L) 03/07/2018   HGB 11.4 (L) 03/07/2018   HCT 34.7 (L) 03/07/2018   MCV 104.2 (H)  03/07/2018   PLT 145 (L) 03/07/2018   NEUTROABS 1.9 03/07/2018    ASSESSMENT & PLAN:  Carcinoma of breast metastatic to bone [facility]) Metastatic breast cancer: recently progressive in liver and bone, also pleura. OnFaslodex + Ibrance beginning 03/31/2016  Toxicities with therapy: 1. Neutropenia: Mild does not require dose adjustment. 2. injection site discomfort very mild 3. Fatigue related to Ibrance  PET CT scan 02/05/2018: Overall mild progression of malignancy.  Nodularity left lung base SUV 9.2 previously 8.4.  The hepatic metastatic lesions are increased in activity with a new segment 6 liver lesion.  Skeletal lesions are increased metabolic activity and some lesions appear larger, left pleural thickening with left pleural effusion, right lower paratracheal lymph node slightly smaller  [facility] molecular testing revealed androgen receptor positivity and negative for PD-L1.  Next generation sequencing was not performed.  I would like to send another requisition to do the next generation sequencing to see if  the patient would benefit from PI 3 kinase inhibitors. It also revealed Horace 1 and mL at 6 suggestive of Lynch syndrome.  I will refer her to genetics for further evaluation and do a comprehensive genetic testing. Return to clinic in 1 month to discuss the results of additional Caris molecular testing and BRCA mutation testing.   Orders Placed This Encounter  Procedures  . CBC with Differential (Cancer Center Only)    Standing Status:   Future    Standing Expiration Date:   03/08/2019  . CMP ([place] only)    Standing Status:   Future    Standing Expiration Date:   03/08/2019   The patient has a good understanding of the overall plan. she agrees with it. she will call with any problems that may develop before the next visit here.   Harriette Ohara, MD 03/07/18

## 2018-03-07 NOTE — Telephone Encounter (Signed)
-----   Message from Elouise Munroe, MD sent at 03/07/2018 11:50 AM EDT ----- Stress test shows no ischemia - no evidence of stress related lack of blood flow to the heart. This is good.   However, we do not yet have a good explanation for the decreased pump function of the heart. A CT coronary angiogram will help [place] determine if there are severe blockages in the heart arteries to determine if that may be causing changes to the way the heart muscle is functioning.   Lattie Haw - please schedule a CTA coronary study.

## 2018-03-08 MED ORDER — METOPROLOL TARTRATE 100 MG PO TABS: ORAL_TABLET | ORAL | 0 refills | Status: DC

## 2018-03-08 NOTE — Telephone Encounter (Signed)
Pt aware of myoview results and Dr.Acharys' recommendation. Pt is agreeable with plan. Adv pt that a scheduler will call he to schedule her CT. Pre-test instructions will be mailed to her home.

## 2018-03-08 NOTE — Telephone Encounter (Signed)
2nd attempt to give pt results and Dr.Acharya's recommendation. lmtcb.

## 2018-03-08 NOTE — Telephone Encounter (Signed)
-----   Message from Elouise Munroe, MD sent at 03/07/2018 11:50 AM EDT ----- Stress test shows no ischemia - no evidence of stress related lack of blood flow to the heart. This is good.   However, we do not yet have a good explanation for the decreased pump function of the heart. A CT coronary angiogram will help [place] determine if there are severe blockages in the heart arteries to determine if that may be causing changes to the way the heart muscle is functioning.   Lattie Haw - please schedule a CTA coronary study.

## 2018-03-19 ENCOUNTER — Ambulatory Visit: Payer: Medicare Other | Admitting: Internal Medicine

## 2018-03-20 ENCOUNTER — Inpatient Hospital Stay: Payer: Medicare Other

## 2018-03-20 ENCOUNTER — Inpatient Hospital Stay: Payer: Medicare Other | Attending: Hematology and Oncology | Admitting: Genetics

## 2018-03-20 DIAGNOSIS — C787 Secondary malignant neoplasm of liver and intrahepatic bile duct: Secondary | ICD-10-CM | POA: Diagnosis not present

## 2018-03-20 DIAGNOSIS — Z809 Family history of malignant neoplasm, unspecified: Secondary | ICD-10-CM

## 2018-03-20 DIAGNOSIS — C782 Secondary malignant neoplasm of pleura: Secondary | ICD-10-CM

## 2018-03-20 DIAGNOSIS — C50919 Malignant neoplasm of unspecified site of unspecified female breast: Secondary | ICD-10-CM | POA: Diagnosis present

## 2018-03-20 DIAGNOSIS — R530 Neoplastic (malignant) related fatigue: Secondary | ICD-10-CM | POA: Insufficient documentation

## 2018-03-20 DIAGNOSIS — Z1379 Encounter for other screening for genetic and chromosomal anomalies: Secondary | ICD-10-CM

## 2018-03-20 DIAGNOSIS — Z808 Family history of malignant neoplasm of other organs or systems: Secondary | ICD-10-CM

## 2018-03-20 DIAGNOSIS — C569 Malignant neoplasm of unspecified ovary: Secondary | ICD-10-CM

## 2018-03-20 DIAGNOSIS — D709 Neutropenia, unspecified: Secondary | ICD-10-CM | POA: Diagnosis not present

## 2018-03-20 DIAGNOSIS — C7951 Secondary malignant neoplasm of bone: Secondary | ICD-10-CM | POA: Diagnosis not present

## 2018-03-20 DIAGNOSIS — Z5111 Encounter for antineoplastic chemotherapy: Secondary | ICD-10-CM | POA: Insufficient documentation

## 2018-03-20 DIAGNOSIS — C50911 Malignant neoplasm of unspecified site of right female breast: Secondary | ICD-10-CM

## 2018-03-20 DIAGNOSIS — Z315 Encounter for genetic counseling: Secondary | ICD-10-CM

## 2018-03-20 DIAGNOSIS — Z8543 Personal history of malignant neoplasm of ovary: Secondary | ICD-10-CM

## 2018-03-20 DIAGNOSIS — Z800 Family history of malignant neoplasm of digestive organs: Secondary | ICD-10-CM

## 2018-03-20 DIAGNOSIS — Z803 Family history of malignant neoplasm of breast: Secondary | ICD-10-CM

## 2018-03-20 LAB — CBC WITH DIFFERENTIAL (CANCER CENTER ONLY)
Abs Immature Granulocytes: 0.01 10*3/uL (ref 0.00–0.07)
Basophils Absolute: 0.1 10*3/uL (ref 0.0–0.1)
Basophils Relative: 1 %
Eosinophils Absolute: 0 10*3/uL (ref 0.0–0.5)
Eosinophils Relative: 1 %
HCT: 36.2 % (ref 36.0–46.0)
Hemoglobin: 11.6 g/dL — ABNORMAL LOW (ref 12.0–15.0)
Immature Granulocytes: 0 %
Lymphocytes Relative: 31 %
Lymphs Abs: 1.4 10*3/uL (ref 0.7–4.0)
MCH: 34 pg (ref 26.0–34.0)
MCHC: 32 g/dL (ref 30.0–36.0)
MCV: 106.2 fL — ABNORMAL HIGH (ref 80.0–100.0)
Monocytes Absolute: 0.4 10*3/uL (ref 0.1–1.0)
Monocytes Relative: 9 %
Neutro Abs: 2.6 10*3/uL (ref 1.7–7.7)
Neutrophils Relative %: 58 %
Platelet Count: 231 10*3/uL (ref 150–400)
RBC: 3.41 MIL/uL — ABNORMAL LOW (ref 3.87–5.11)
RDW: 14.4 % (ref 11.5–15.5)
WBC Count: 4.5 10*3/uL (ref 4.0–10.5)
nRBC: 0 % (ref 0.0–0.2)

## 2018-03-20 LAB — CMP (CANCER CENTER ONLY)
ALT: 8 U/L (ref 0–44)
AST: 18 U/L (ref 15–41)
Albumin: 3.3 g/dL — ABNORMAL LOW (ref 3.5–5.0)
Alkaline Phosphatase: 44 U/L (ref 38–126)
Anion gap: 10 (ref 5–15)
BUN: 10 mg/dL (ref 8–23)
CO2: 27 mmol/L (ref 22–32)
Calcium: 9.5 mg/dL (ref 8.9–10.3)
Chloride: 104 mmol/L (ref 98–111)
Creatinine: 0.77 mg/dL (ref 0.44–1.00)
GFR, Est AFR Am: >60 mL/min (ref >60)
GFR, Estimated: >60 mL/min (ref >60)
Glucose, Bld: 76 mg/dL (ref 70–99)
Potassium: 4.9 mmol/L (ref 3.5–5.1)
Sodium: 141 mmol/L (ref 135–145)
Total Bilirubin: 0.7 mg/dL (ref 0.3–1.2)
Total Protein: 6.8 g/dL (ref 6.5–8.1)

## 2018-03-21 ENCOUNTER — Encounter: Payer: Self-pay | Admitting: Genetics

## 2018-03-21 DIAGNOSIS — Z1379 Encounter for other screening for genetic and chromosomal anomalies: Secondary | ICD-10-CM | POA: Insufficient documentation

## 2018-03-21 DIAGNOSIS — Z803 Family history of malignant neoplasm of breast: Secondary | ICD-10-CM | POA: Insufficient documentation

## 2018-03-21 DIAGNOSIS — Z800 Family history of malignant neoplasm of digestive organs: Secondary | ICD-10-CM | POA: Insufficient documentation

## 2018-03-21 DIAGNOSIS — Z808 Family history of malignant neoplasm of other organs or systems: Secondary | ICD-10-CM | POA: Insufficient documentation

## 2018-03-21 NOTE — Progress Notes (Signed)
REFERRING PROVIDER: Nicholas Lose, MD [name], [place] [id number]  PRIMARY PROVIDER:  [name], MD  PRIMARY REASON FOR VISIT:  1. Carcinoma of breast metastatic to bone, unspecified laterality ([name])   2. Family history of breast cancer   3. Family history of colon cancer   4. Family history of skin cancer   5. Carcinoma of breast metastatic to multiple sites, unspecified laterality ([name])   6. Malignant neoplasm of ovary, unspecified laterality ([name])   7. Breast cancer metastasized to multiple sites, unspecified laterality ([name])   8. History of ovarian cancer     HISTORY OF PRESENT ILLNESS:   Brittany Porter, a 72 y.o. female, was seen for a [name] cancer genetics consultation at the request of Dr. Lindi Adie due to a family history of cancer.  Brittany Porter presents to clinic today to discuss the possibility of a hereditary predisposition to cancer, genetic testing, and to further clarify her future cancer risks, as well as potential cancer risks for family members.   In 1997, at the age of 46, Brittany Porter was diagnosed with bilateral breast cancer that is now metastatic.  In 2010 Brittany Porter as dx with a stage II endometrial cancer and stage I ovarian cancer.   IHC testing on her breast tumor revealed the MMR/Lynch proteins were present/intact.    CANCER HISTORY:    Breast cancer metastasized to liver [facility])   1997 Initial Diagnosis    bilateral breast cancers 1997 treated with bilateral mastectomies and axillary node dissections, adjuvant chemotherapy and radiation.    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 BRCA reportedly normal.    08/25/2015 Relapse/Recurrence    new solitary liver  lesion, biopsy showing metastatic ER PR + breast    09/25/2015 - 03/11/2016 Chemotherapy    Xeloda. Stopped due to progression of liver and bone metastases; pleural metastases    03/31/2016 -  Anti-estrogen oral therapy    Faslodex plus Ibrance (along with Xgeva)     Carcinoma of breast metastatic to bone ([name])   03/27/2016 Initial Diagnosis    Carcinoma of breast metastatic to bone ([name])    08/03/2017 -  Chemotherapy    The patient had fulvestrant (FASLODEX) injection 500 mg, 500 mg, Intramuscular,  Once, 7 of 7 cycles Administration: 500 mg (08/03/2017), 500 mg (08/31/2017), 500 mg (10/11/2017), 500 mg (11/08/2017), 500 mg (12/07/2017), 500 mg (01/04/2018)  for chemotherapy treatment.      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 Arthritis   . Breast cancer ([name])   . Diabetes mellitus   . Endometrial carcinoma ([name]) 10/2008  . Family history of breast cancer   . Family history of colon cancer   . Family history of skin cancer   . Fluid overload   . History of radiation therapy 04/13/2009, 04/16/2009, 04/27/2009, 05/07/2009, 05/18/2009   3000 cGy to proximal vagina  . Hyperlipidemia   . Iron deficiency   . Melanoma ([name]) 2010   rt arm and back  . Ovarian cancer ([name])   .  Pleural effusion 04/2010    Past Surgical History:  Procedure Laterality Date  . ABDOMINAL HYSTERECTOMY  10/2008  . INSERTION OF MESH  05/28/2012   Procedure: INSERTION OF MESH;  Surgeon: Adin Hector, MD;  Location: [facility];  Service: General;  Laterality: N/A;  . KNEE ARTHROSCOPY  04/2010   Right knee  . LAPAROSCOPIC LYSIS OF ADHESIONS  05/28/2012   Procedure: LAPAROSCOPIC LYSIS OF ADHESIONS;  Surgeon: Adin Hector, MD;  Location: [facility];  Service: General;  Laterality: N/A;  . MASTECTOMY  1997   Bilateral with lymph nodes   . Melanoma removal  02/2009, 07/2009  . VENTRAL HERNIA REPAIR  05/28/2012   Procedure: LAPAROSCOPIC VENTRAL HERNIA;  Surgeon: Adin Hector, MD;  Location: [place];  Service: General;  Laterality: N/A;    Social History   Socioeconomic History  . Marital status: Married    Spouse name: Not on file  . Number of children: Not on file  . Years of education: Not on file  . Highest education level: Not on file  Occupational History  . Not on file  Social Needs  . Financial resource strain: Not on file  . Food insecurity:    Worry: Not on file    Inability: Not on file  . Transportation needs:    Medical: Not on file    Non-medical: Not on file  Tobacco Use  . Smoking status: Never Smoker  . Smokeless tobacco: Never Used  Substance and Sexual Activity  . Alcohol use: No  . Drug use: No  . Sexual activity: Yes  Lifestyle  . Physical activity:    Days per week: Not on file    Minutes per session: Not on file  . Stress: Not on file  Relationships  . Social connections:    Talks on phone: Not on file    Gets together: Not on file    Attends religious service: Not on file    Active member of club or organization: Not on file    Attends meetings of clubs or organizations: Not on file    Relationship status: Not on file  Other Topics Concern  . Not on file  Social History Narrative  . Not on file     FAMILY HISTORY:  We obtained a detailed, 4-generation family history.  Significant diagnoses are listed below: Family History  Problem Relation Age of Onset  . Stroke Mother   . Cancer Father        'voice box'  . Colon cancer Sister        dx >50  . Heart attack Brother   . Skin cancer Brother 4  . Cancer Sister        primary site unk, dx >50  . Stroke Brother   . Stroke Sister   . Colon cancer Sister 67  . Dementia Sister   . Breast cancer Sister        dx >5-, met to brain    Brittany Porter has 2 sons and a daughter. 1 of her sons had skin cancer on his leg.   MS. Connole has 4 grandchildren with no hx of cancer.   Brittany Porter has 7 sisters and 4 brothers: -1 sister dx with breast cancer met to brain dx >50 -1 sister dx with colon caner >50, deceased.  She had 5 children, 1 of her sons died of cancer (type unk) -1 sister died of colon cancer at 87 (dx 47's) she also had dimentia -  1 sister died of stroke -1 sister died of metastatic cancer, primary site unk, dx >50 -2 sister alive, no hx of cancer -1 brother skin cancer at 35 -60 brother AMI -1 brother died of storke -1 brother alive, no hx of cancer  Ms. Presswood's father: had 'voice box' cancer Paternal Aunts/Uncles: 3 paternal aunts, 1 paternal uncle, no hx of cancer.  Paternal cousins: no known hx of cancer Paternal grandfather: died young, unk cause Paternal grandmother:died young, unk cause  Ms. Lakatos's mother: died at 31 due to cerebral hemmhorage Maternal Aunts/Uncles: 2 maternal uncles, 3 maternal aunts, no hx of cancer Maternal cousins: no known hx of cancer Maternal grandfather: no known cancer Maternal grandmother:died of a cerebral hemmhorage  Ms. Goodall is unaware of previous family history of genetic testing for hereditary cancer risks. Patient's maternal ancestors are of N. European descent, and paternal ancestors are of N. Eurpean descent. There is no reported Ashkenazi Jewish ancestry. There is no known consanguinity.  GENETIC COUNSELING ASSESSMENT: Brittany Porter is a 72 y.o. female with a personal and family which is somewhat suggestive of a Hereditary Cancer Predisposition Syndrome. We, therefore, discussed and recommended the following at today's visit.   DISCUSSION: We reviewed the characteristics, features and inheritance patterns of hereditary cancer syndromes. We discussed the implications of her test result.  GENETIC TEST RESULTS: Brittany Porter had genetic testing sent out prior to her appointment, we disclosed her results to her during the appointment and dicussed the  meaning of these results for her and her family.   Genetic testing performed through [facility]'s Multi-Cancer reported out on 03/19/2018 showed no pathogenic mutations. The Multi-Cancer Panel offered by [facility] includes sequencing and/or deletion duplication testing of the following 91 genes: AIP, ALK, APC, ATM, AXIN2, BAP1, BARD1, BLM, BMPR1A, BRCA1, BRCA2, BRIP1, BUB1B, CASR, CDC73, CDH1, CDK4, CDKN1B, CDKN1C, CDKN2A, CEBPA, CEP57, CHEK2, CTNNA1, DICER1, DIS3L2, EGFR, ENG, EPCAM, FH, FLCN, GALNT12, GATA2, GPC3, GREM1, HOXB13, HRAS, KIT, MAX, MEN1, MET, MITF, MLH1, MLH3, MSH2, MSH3, MSH6, MUTYH, NBN, NF1, NF2, NTHL1, PALB2, PDGFRA, PHOX2B, PMS2, POLD1, POLE, POT1, PRKAR1A, PTCH1, PTEN, RAD50, RAD51C, RAD51D, RB1, RECQL4, RET, RNF43, RPS20, RUNX1, SDHA, SDHAF2, SDHB, SDHC, SDHD, SMAD4, SMARCA4, SMARCB1, SMARCE1, STK11, SUFU, TERC, TERT, TMEM127, TP53, TSC1, TSC2, VHL, WRN, WT1  A variant of uncertain significance (VUS) in a gene called MET was also noted. [id number] ([id number])  The test report will be scanned into EPIC and will be located under the Molecular Pathology section of the Results Review tab. A portion of the result report is included below for reference.     We discussed with Brittany Porter that because current genetic testing is not perfect, it is possible there may be a gene mutation in one of these genes that current testing cannot detect, but that chance is small.  We also discussed, that there could be another gene that has not yet been discovered, or that we have not yet tested, that is responsible for the cancer diagnoses in the family. It is also possible there is a hereditary cause for the cancer in the family that Brittany Porter did not inherit and therefore was not identified in her testing.  Therefore, it is important to remain in touch with cancer genetics in the future so that we can continue to offer Brittany Porter the most up to date genetic testing.   Regarding the VUS in MET: At this  time, it is unknown if this variant is associated with increased cancer risk or if this  is a normal finding, but most variants such as this get reclassified to being inconsequential. It should not be used to make medical management decisions. With time, we suspect the lab will determine the significance of this variant, if any. If we do learn more about it, we will try to contact Brittany Porter to discuss it further. However, it is important to stay in touch with [place] periodically and keep the address and phone number up to date.  ADDITIONAL GENETIC TESTING: We discussed with Brittany Porter that her genetic testing was fairly extensive.  If there are are genes identified to increase cancer risk that can be analyzed in the future, we would be happy to discuss and coordinate this testing at that time.    CANCER SCREENING RECOMMENDATIONS: This negative result means that we were unable to identify a hereditary cause for her personal and family history of cancer at this time.  This result does not necessarily rule out a hereditary cause for her  cancer.  It is still possible that there could be genetic mutations that are undetectable by current technology, or genetic mutations in genes that have not been tested or identified to increase cancer risk.  Therefore, it is recommended she continue to follow the cancer management and screening guidelines provided by her oncology and primary healthcare provider. An individual's cancer risk is not determined by genetic test results alone.  Overall cancer risk assessment includes additional factors such as personal medical history, family history, etc.  These should be used to make a personalized plan for cancer prevention and surveillance.    RECOMMENDATIONS FOR FAMILY MEMBERS:  Relatives in this family might be at some increased risk of developing cancer, over the general population risk, simply due to the family history of cancer.  We recommended women in this family have a  yearly mammogram beginning at age 82, or 56 years younger than the earliest onset of cancer, an annual clinical breast exam, and perform monthly breast self-exams. Women in this family should also have a gynecological exam as recommended by their primary provider. All family members should have a colonoscopy as directed by their doctors.  All family members should inform their physicians about the family history of cancer so their doctors can make the most appropriate screening recommendations for them.   FOLLOW-UP: Lastly, we discussed with Brittany Porter that cancer genetics is a rapidly advancing field and it is possible that new genetic tests will be appropriate for her and/or her family members in the future. We encouraged her to remain in contact with cancer genetics on an annual basis so we can update her personal and family histories and let her know of advances in cancer genetics that may benefit this family.   Our contact number was provided. Brittany Porter's questions were answered to her satisfaction, and she knows she is welcome to call us at anytime with additional questions or concerns.   Ferol Luz, MS, [facility] Certified Genetic Counselor [email or web address]

## 2018-03-28 ENCOUNTER — Other Ambulatory Visit: Payer: Self-pay | Admitting: Hematology and Oncology

## 2018-03-28 DIAGNOSIS — C50919 Malignant neoplasm of unspecified site of unspecified female breast: Secondary | ICD-10-CM

## 2018-03-29 ENCOUNTER — Other Ambulatory Visit: Payer: Self-pay

## 2018-03-29 DIAGNOSIS — I4892 Unspecified atrial flutter: Secondary | ICD-10-CM

## 2018-03-29 LAB — BASIC METABOLIC PANEL
BUN/Creatinine Ratio: 21 (ref 12–28)
BUN: 19 mg/dL (ref 8–27)
CO2: 27 mmol/L (ref 20–29)
Calcium: 8 mg/dL — ABNORMAL LOW (ref 8.7–10.3)
Chloride: 98 mmol/L (ref 96–106)
Creatinine, Ser: 0.9 mg/dL (ref 0.57–1.00)
GFR calc Af Amer: 73 mL/min/{1.73_m2} (ref >59)
GFR calc non Af Amer: 63 mL/min/{1.73_m2} (ref >59)
Glucose: 163 mg/dL — ABNORMAL HIGH (ref 65–99)
Potassium: 4.2 mmol/L (ref 3.5–5.2)
Sodium: 142 mmol/L (ref 134–144)

## 2018-04-04 ENCOUNTER — Ambulatory Visit (HOSPITAL_COMMUNITY)
Admission: RE | Admit: 2018-04-04 | Discharge: 2018-04-04 | Disposition: A | Discharge status: Home / Self Care | Payer: Medicare Other | Source: Ambulatory Visit | Attending: Internal Medicine | Admitting: Internal Medicine

## 2018-04-04 DIAGNOSIS — I517 Cardiomegaly: Secondary | ICD-10-CM | POA: Insufficient documentation

## 2018-04-04 DIAGNOSIS — R918 Other nonspecific abnormal finding of lung field: Secondary | ICD-10-CM | POA: Diagnosis not present

## 2018-04-04 DIAGNOSIS — C7951 Secondary malignant neoplasm of bone: Secondary | ICD-10-CM | POA: Insufficient documentation

## 2018-04-04 DIAGNOSIS — C801 Malignant (primary) neoplasm, unspecified: Secondary | ICD-10-CM | POA: Insufficient documentation

## 2018-04-04 DIAGNOSIS — I2510 Atherosclerotic heart disease of native coronary artery without angina pectoris: Secondary | ICD-10-CM | POA: Insufficient documentation

## 2018-04-04 MED ORDER — IOPAMIDOL (ISOVUE-370) INJECTION 76%
100.0000 mL | Freq: Once | INTRAVENOUS | Status: AC | PRN
  Administered 2018-04-04: 100 mL via INTRAVENOUS

## 2018-04-04 MED ORDER — METOPROLOL TARTRATE 5 MG/5ML IV SOLN
5.0000 mg | INTRAVENOUS | Status: DC | PRN
  Administered 2018-04-04: 5 mg via INTRAVENOUS
  Filled 2018-04-04: qty 5

## 2018-04-04 MED ORDER — NITROGLYCERIN 0.4 MG SL SUBL
SUBLINGUAL_TABLET | SUBLINGUAL | Status: AC
  Filled 2018-04-04: qty 2

## 2018-04-04 MED ORDER — METOPROLOL TARTRATE 5 MG/5ML IV SOLN
INTRAVENOUS | Status: AC
  Filled 2018-04-04: qty 5

## 2018-04-04 MED ORDER — NITROGLYCERIN 0.4 MG SL SUBL
0.8000 mg | SUBLINGUAL_TABLET | Freq: Once | SUBLINGUAL | Status: AC
  Administered 2018-04-04: 0.8 mg via SUBLINGUAL
  Filled 2018-04-04: qty 25

## 2018-04-04 NOTE — Progress Notes (Signed)
CT scan completed. Tolerated well. D/c home with husband. Awake and alert. In no distress

## 2018-04-05 ENCOUNTER — Inpatient Hospital Stay: Payer: Medicare Other | Admitting: Hematology and Oncology

## 2018-04-05 ENCOUNTER — Inpatient Hospital Stay: Payer: Medicare Other

## 2018-04-05 NOTE — Assessment & Plan Note (Deleted)
Metastatic breast cancer: recently progressive in liver and bone, also pleura. OnFaslodex + Ibrance beginning 03/31/2016  Toxicities with therapy: 1. Neutropenia: Mild does not require dose adjustment. 2. injection site discomfort very mild 3. Fatigue related to Ibrance  PET CT scan 02/05/2018: Overall mild progression of malignancy. Nodularity left lung base SUV 9.2 previously 8.4. The hepatic metastatic lesions are increased in activity with a new segment 6 liver lesion. Skeletal lesions are increased metabolic activity and some lesions appear larger, left pleural thickening with left pleural effusion, right lower paratracheal lymph node slightly smaller  [facility] molecular testing revealed androgen receptor positivity and negative for PD-L1.    Genetic testing: No mutations detected

## 2018-04-06 ENCOUNTER — Telehealth: Payer: Self-pay

## 2018-04-06 MED FILL — IBRANCE 125 MG CAPSULE: 125 | 28 days supply | Qty: 21 | Fill #0

## 2018-04-06 NOTE — Telephone Encounter (Addendum)
Called pt x2 lmtcb on home and mobile #s.  Per Dr.Acharya the pt's Cor CTA was not performed correctly and completely. The study should be repeated with no charge to the patient.  Brittany Porter @ [address] is aware and awaiting word to reschedule the pt.

## 2018-04-10 ENCOUNTER — Inpatient Hospital Stay: Payer: Medicare Other

## 2018-04-10 ENCOUNTER — Inpatient Hospital Stay (HOSPITAL_BASED_OUTPATIENT_CLINIC_OR_DEPARTMENT_OTHER): Payer: Medicare Other | Admitting: Hematology and Oncology

## 2018-04-10 ENCOUNTER — Telehealth: Payer: Self-pay | Admitting: Hematology and Oncology

## 2018-04-10 DIAGNOSIS — C7951 Secondary malignant neoplasm of bone: Secondary | ICD-10-CM

## 2018-04-10 DIAGNOSIS — C50919 Malignant neoplasm of unspecified site of unspecified female breast: Secondary | ICD-10-CM | POA: Diagnosis not present

## 2018-04-10 DIAGNOSIS — C50911 Malignant neoplasm of unspecified site of right female breast: Secondary | ICD-10-CM

## 2018-04-10 DIAGNOSIS — I517 Cardiomegaly: Secondary | ICD-10-CM

## 2018-04-10 DIAGNOSIS — R530 Neoplastic (malignant) related fatigue: Secondary | ICD-10-CM

## 2018-04-10 DIAGNOSIS — D709 Neutropenia, unspecified: Secondary | ICD-10-CM

## 2018-04-10 DIAGNOSIS — K769 Liver disease, unspecified: Secondary | ICD-10-CM

## 2018-04-10 DIAGNOSIS — C787 Secondary malignant neoplasm of liver and intrahepatic bile duct: Secondary | ICD-10-CM | POA: Diagnosis not present

## 2018-04-10 DIAGNOSIS — Z5111 Encounter for antineoplastic chemotherapy: Secondary | ICD-10-CM | POA: Diagnosis not present

## 2018-04-10 DIAGNOSIS — C782 Secondary malignant neoplasm of pleura: Secondary | ICD-10-CM

## 2018-04-10 LAB — CBC WITH DIFFERENTIAL (CANCER CENTER ONLY)
Abs Immature Granulocytes: 0 10*3/uL (ref 0.00–0.07)
Basophils Absolute: 0.1 10*3/uL (ref 0.0–0.1)
Basophils Relative: 2 %
Eosinophils Absolute: 0 10*3/uL (ref 0.0–0.5)
Eosinophils Relative: 1 %
HCT: 33.3 % — ABNORMAL LOW (ref 36.0–46.0)
Hemoglobin: 10.9 g/dL — ABNORMAL LOW (ref 12.0–15.0)
Immature Granulocytes: 0 %
Lymphocytes Relative: 44 %
Lymphs Abs: 1.4 10*3/uL (ref 0.7–4.0)
MCH: 34.8 pg — ABNORMAL HIGH (ref 26.0–34.0)
MCHC: 32.7 g/dL (ref 30.0–36.0)
MCV: 106.4 fL — ABNORMAL HIGH (ref 80.0–100.0)
Monocytes Absolute: 0.5 10*3/uL (ref 0.1–1.0)
Monocytes Relative: 15 %
Neutro Abs: 1.3 10*3/uL — ABNORMAL LOW (ref 1.7–7.7)
Neutrophils Relative %: 38 %
Platelet Count: 140 10*3/uL — ABNORMAL LOW (ref 150–400)
RBC: 3.13 MIL/uL — ABNORMAL LOW (ref 3.87–5.11)
RDW: 15.5 % (ref 11.5–15.5)
WBC Count: 3.3 10*3/uL — ABNORMAL LOW (ref 4.0–10.5)
nRBC: 0 % (ref 0.0–0.2)

## 2018-04-10 LAB — CMP (CANCER CENTER ONLY)
ALT: 11 U/L (ref 0–44)
AST: 25 U/L (ref 15–41)
Albumin: 3.3 g/dL — ABNORMAL LOW (ref 3.5–5.0)
Alkaline Phosphatase: 45 U/L (ref 38–126)
Anion gap: 9 (ref 5–15)
BUN: 9 mg/dL (ref 8–23)
CO2: 28 mmol/L (ref 22–32)
Calcium: 8.9 mg/dL (ref 8.9–10.3)
Chloride: 105 mmol/L (ref 98–111)
Creatinine: 0.68 mg/dL (ref 0.44–1.00)
GFR, Est AFR Am: >60 mL/min (ref >60)
GFR, Estimated: >60 mL/min (ref >60)
Glucose, Bld: 75 mg/dL (ref 70–99)
Potassium: 4.6 mmol/L (ref 3.5–5.1)
Sodium: 142 mmol/L (ref 135–145)
Total Bilirubin: 0.8 mg/dL (ref 0.3–1.2)
Total Protein: 6.3 g/dL — ABNORMAL LOW (ref 6.5–8.1)

## 2018-04-10 MED ORDER — FULVESTRANT 250 MG/5ML IM SOLN
INTRAMUSCULAR | Status: AC
  Filled 2018-04-10: qty 10

## 2018-04-10 MED ORDER — FULVESTRANT 250 MG/5ML IM SOLN
500.0000 mg | Freq: Once | INTRAMUSCULAR | Status: AC
  Administered 2018-04-10: 500 mg via INTRAMUSCULAR

## 2018-04-10 MED ORDER — GABAPENTIN 300 MG PO CAPS: 300.0000 mg | ORAL_CAPSULE | Freq: Two times a day (BID) | ORAL | Status: DC

## 2018-04-10 MED ORDER — DENOSUMAB 120 MG/1.7ML ~~LOC~~ SOLN
SUBCUTANEOUS | Status: AC
  Filled 2018-04-10: qty 1.7

## 2018-04-10 MED ORDER — DENOSUMAB 120 MG/1.7ML ~~LOC~~ SOLN: 120.0000 mg | Freq: Once | SUBCUTANEOUS | Status: DC

## 2018-04-10 NOTE — Assessment & Plan Note (Signed)
Metastatic breast cancer: recently progressive in liver and bone, also pleura. OnFaslodex + Ibrance beginning 03/31/2016  Toxicities with therapy: 1. Neutropenia: Mild does not require dose adjustment. 2. injection site discomfort very mild 3. Fatigue related to Ibrance  PET CT scan 02/05/2018: Overall mild progression of malignancy. Nodularity left lung base SUV 9.2 previously 8.4. The hepatic metastatic lesions are increased in activity with a new segment 6 liver lesion. Skeletal lesions are increased metabolic activity and some lesions appear larger, left pleural thickening with left pleural effusion, right lower paratracheal lymph node slightly smaller  [facility] molecular testing: Androgen receptor positive, PDL 1 neg, MSI stable, NTRK1/2/3Neg, T MBB intermediate 9 mutations/Mb, ESR 1-, PI 3 CA negative, BRCA negative  Recommendation: Based on the above data, patient may be eligible for either androgen receptor antagonists or immunotherapy.  We will plan on rescanning her in December and seeing whether there is continued progression of disease

## 2018-04-10 NOTE — Progress Notes (Signed)
Patient Care Team: [name], MD as PCP - General (Family Medicine)  DIAGNOSIS:    ICD-10-CM   1. 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 BRCA reportedly normal.    08/25/2015 Relapse/Recurrence    new solitary liver lesion, biopsy showing metastatic ER PR + breast    09/25/2015 - 03/11/2016 Chemotherapy    Xeloda. Stopped due to progression of liver and bone metastases; pleural metastases    03/31/2016 -  Anti-estrogen oral therapy    Foslodex started 03/31/16, currently monthly  Ibrance 142m 3 weeks on, 1 week off started 03/31/16 Xgeva started on 05/05/16, currently every 3 months  Letrozole daily added 08/01/17    03/20/2018 Genetic Testing    Genetic testing performed through [facility]'s Multi-Cancer reported out on 03/19/2018 showed no pathogenic mutations. The Multi-Cancer Panel offered by [facility] includes sequencing and/or deletion duplication testing of the following 91 genes: AIP, ALK, APC, ATM, AXIN2, BAP1, BARD1, BLM, BMPR1A, BRCA1, BRCA2, BRIP1, BUB1B, CASR, CDC73, CDH1, CDK4, CDKN1B, CDKN1C, CDKN2A, CEBPA, CEP57, CHEK2, CTNNA1, DICER1, DIS3L2, EGFR, ENG, EPCAM, FH, FLCN, GALNT12, GATA2, GPC3, GREM1, HOXB13, HRAS, KIT, MAX, MEN1, MET, MITF, MLH1, MLH3, MSH2, MSH3, MSH6, MUTYH, NBN, NF1, NF2, NTHL1, PALB2, PDGFRA, PHOX2B, PMS2, POLD1, POLE, POT1, PRKAR1A, PTCH1, PTEN, RAD50, RAD51C, RAD51D,  RB1, RECQL4, RET, RNF43, RPS20, RUNX1, SDHA, SDHAF2, SDHB, SDHC, SDHD, SMAD4, SMARCA4, SMARCB1, SMARCE1, STK11, SUFU, TERC, TERT, TMEM127, TP53, TSC1, TSC2, VHL, WRN, WT1  A variant of uncertain significance (VUS) in a gene called MET was also noted. c.[id number]>G (p.Thr557Ala)    04/08/2018 Miscellaneous    [facility] molecular testing: Androgen receptor positive, PDL 1 neg, MSI stable, NTRK1/2/3Neg, T MBB intermediate 9 mutations/Mb, ESR 1-, PI 3 CA negative, BRCA negative     Carcinoma of breast metastatic to bone ([place]   03/27/2016 Initial Diagnosis    Carcinoma of breast metastatic to bone ([facility]    03/31/2016 -  Chemotherapy    Foslodex started 03/31/16, currently monthly  Ibrance 1235m3 weeks on, 1 week off started 03/31/16 Daily letrozole added 08/01/17     Breast cancer metastasized to liver, unspecified laterality ([facility]   03/27/2016 Initial Diagnosis    Breast cancer metastasized to liver, unspecified laterality ([facility]    CHIEF COMPLIANT: Follow-up of metastatic breast cancer and genetic testing  INTERVAL HISTORY: RuORLINDA Porter a [age].o. with above-mentioned history of metastatic breast cancer with slowly progressing metastatic disease in the liver. On 02/05/18 she had a PET scan that showed mild progression of malignancy. On 04/04/18 the patient had a coronary CT that showed mild cardiomegaly and sclerotic metastatic disease in the sternum and visualized thoracic spine. She is currently being treated with daily Ibrance and Letrozole. She presents to the clinic today to review recent genetic testing and consider new treatment options. Her most recent CBC showed WBC 3.3, Hg 10.9, platelets 140, neutrophils 1.3. She  will get her next cardiac CT in 04/2018.    REVIEW OF SYSTEMS:   Constitutional: Denies fevers, chills or abnormal weight loss Eyes: Denies blurriness of vision Ears, nose, mouth, throat, and face: Denies mucositis or sore throat Respiratory: Denies cough,  dyspnea or wheezes Cardiovascular: Denies palpitation, chest discomfort Gastrointestinal:  Denies nausea, heartburn or change in bowel habits Skin: Denies abnormal skin rashes Lymphatics: Denies new lymphadenopathy or easy bruising Neurological:Denies numbness, tingling or new weaknesses Behavioral/Psych: Mood is stable, no new changes  Extremities: No lower extremity edema Breast: denies any pain or lumps or nodules in either breasts All other systems were reviewed with the patient and are negative.  I have reviewed the past medical history, past surgical history, social history and family history with the patient and they are unchanged from previous note.  ALLERGIES:  is allergic to sulfa antibiotics.  MEDICATIONS:  Current Outpatient Medications  Medication Sig Dispense Refill  . apixaban (ELIQUIS) 5 MG TABS tablet Take 1 tablet (5 mg total) by mouth 2 (two) times daily. 60 tablet 5  . Calcium Carb-Cholecalciferol (CALCIUM 600+D3) 600-800 MG-UNIT TABS Take 1 tablet by mouth daily.     . Cholecalciferol (VITAMIN D3) 2000 UNITS TABS Take 2,000 mg by mouth daily.    . ferrous sulfate 325 (65 FE) MG tablet Take 325 mg by mouth daily with breakfast.    . fish oil-omega-3 fatty acids 1000 MG capsule Take 1 g by mouth 2 (two) times daily.     . furosemide (LASIX) 20 MG tablet Take 20 mg by mouth daily as needed for fluid or edema. Reported on 05/07/2015    . gabapentin (NEURONTIN) 300 MG capsule Take 1 capsule (300 mg total) by mouth 2 (two) times daily.    Marland Kitchen glimepiride (AMARYL) 2 MG tablet Take 2 mg by mouth daily with breakfast.    . IBRANCE 125 MG capsule TAKE 1 CAPSULE (125 MG TOTAL) BY MOUTH DAILY WITH BREAKFAST. TAKE FOR 21 DAYS ON, 7 DAYS OFF. TAKE WHOLE WITH FOOD. 21 capsule 2  . ibuprofen (ADVIL,MOTRIN) 200 MG tablet Take 400 mg by mouth every 6 (six) hours as needed for headache (pain). Reported on 11/12/2015    . letrozole (FEMARA) 2.5 MG tablet Take 1 tablet (2.5 mg total) by  mouth daily. 90 tablet 3  . metFORMIN (GLUCOPHAGE) 500 MG tablet Take 500 mg by mouth 2 (two) times daily with a meal.      . metoprolol tartrate (LOPRESSOR) 100 MG tablet Take 1 tablet (158m) 2 hours prior to your Coronary  CT 1 tablet 0  . simvastatin (ZOCOR) 40 MG tablet Take 40 mg by mouth at bedtime.       No current facility-administered medications for this visit.     PHYSICAL EXAMINATION: ECOG PERFORMANCE STATUS: 1 - Symptomatic but completely ambulatory  Vitals:   04/10/18 1316  BP: 127/88  Pulse: 92  Resp: 17  Temp: 98 F (36.7 C)  SpO2: 95%   Filed Weights   04/10/18 1316  Weight: 220 lb 9.6 oz (100.1 kg)    GENERAL:alert, no distress and comfortable SKIN: skin color, texture, turgor are normal, no rashes or significant lesions EYES: normal, Conjunctiva are pink and non-injected, sclera clear OROPHARYNX:no exudate, no erythema and lips, buccal mucosa, and tongue normal  NECK: supple, thyroid normal size, non-tender, without nodularity LYMPH:  no palpable lymphadenopathy in the cervical, axillary or inguinal LUNGS: clear to auscultation and percussion with normal breathing effort HEART: regular rate & rhythm  and no murmurs and no lower extremity edema ABDOMEN:abdomen soft, non-tender and normal bowel sounds MUSCULOSKELETAL:no cyanosis of digits and no clubbing  NEURO: alert & oriented x 3 with fluent speech, no focal motor/sensory deficits EXTREMITIES: No lower extremity edema  LABORATORY DATA:  I have reviewed the data as listed CMP Latest Ref Rng & Units 03/29/2018 03/20/2018 03/07/2018  Glucose 65 - 99 mg/dL 163(H) 76 166(H)  BUN 8 - 27 mg/dL '19 10 13  ' Creatinine 0.57 - 1.00 mg/dL 0.90 0.77 0.82  Sodium 134 - 144 mmol/L 142 141 139  Potassium 3.5 - 5.2 mmol/L 4.2 4.9 4.2  Chloride 96 - 106 mmol/L 98 104 101  CO2 20 - 29 mmol/L '27 27 27  ' Calcium 8.7 - 10.3 mg/dL 8.0(L) 9.5 8.8(L)  Total Protein 6.5 - 8.1 g/dL - 6.8 6.7  Total Bilirubin 0.3 - 1.2 mg/dL -  0.7 1.0  Alkaline Phos 38 - 126 U/L - 44 43  AST 15 - 41 U/L - 18 20  ALT 0 - 44 U/L - 8 9    Lab Results  Component Value Date   WBC 3.3 (L) 04/10/2018   HGB 10.9 (L) 04/10/2018   HCT 33.3 (L) 04/10/2018   MCV 106.4 (H) 04/10/2018   PLT 140 (L) 04/10/2018   NEUTROABS 1.3 (L) 04/10/2018    ASSESSMENT & PLAN:  Breast cancer metastasized to liver [facility]) Metastatic breast cancer: recently progressive in liver and bone, also pleura. OnFaslodex + Ibrance beginning 03/31/2016  Toxicities with therapy: 1. Neutropenia: Mild does not require dose adjustment. 2. injection site discomfort very mild 3. Fatigue related to Ibrance  PET CT scan 02/05/2018: Overall mild progression of malignancy. Nodularity left lung base SUV 9.2 previously 8.4. The hepatic metastatic lesions are increased in activity with a new segment 6 liver lesion. Skeletal lesions are increased metabolic activity and some lesions appear larger, left pleural thickening with left pleural effusion, right lower paratracheal lymph node slightly smaller  [facility] molecular testing: Androgen receptor positive, PDL 1 neg, MSI stable, NTRK1/2/3Neg, T MBB intermediate 9 mutations/Mb, ESR 1-, PI 3 CA negative, BRCA negative  Recommendation: Based on the above data, patient may be eligible for either androgen receptor antagonists or immunotherapy.  We will plan on rescanning her in December and seeing whether there is continued progression of disease Patient has a cardiac CT planned for December.  We will review the CT scan for her lung concerns as well. I will not order additional CT scans. If the scans show stable findings and will continue with the current treatment. Return to clinic in 4 weeks for injection plan to follow-up on the CT chest  No orders of the defined types were placed in this encounter.  The patient has a good understanding of the overall plan. she agrees with it. she will call with any problems that may develop  before the next visit here.  Brittany Lose, MD 04/10/2018   I, Brittany Porter, am acting as scribe for Brittany Lose, MD.  I have reviewed the above documentation for accuracy and completeness, and I agree with the above.

## 2018-04-10 NOTE — Telephone Encounter (Signed)
Gave patient avs and calendar.   °

## 2018-04-16 ENCOUNTER — Telehealth: Payer: Self-pay | Admitting: Internal Medicine

## 2018-04-16 DIAGNOSIS — I2510 Atherosclerotic heart disease of native coronary artery without angina pectoris: Secondary | ICD-10-CM

## 2018-04-16 MED ORDER — METOPROLOL TARTRATE 100 MG PO TABS: ORAL_TABLET | ORAL | 0 refills | Status: DC

## 2018-04-16 NOTE — Telephone Encounter (Signed)
Called patient, reordered BMP for one week out, and also metoprolol medication per instructions. Patient verbalized understanding.

## 2018-04-16 NOTE — Telephone Encounter (Signed)
Follow Up:    Please call, concerning her test she is supposed to have.

## 2018-04-16 NOTE — Telephone Encounter (Signed)
  Dr Margaretann Loveless requested patient to have another CT at the hospital. Her CT is set up for April 25, 2018. The patient would like to know if she needs to redo her lab work before her CT and if she needs to get the metoprolol again. She did have labs last Tuesday at the [facility].

## 2018-04-17 NOTE — Telephone Encounter (Signed)
Returned pt call.[email or web address] 

## 2018-04-18 ENCOUNTER — Encounter (HOSPITAL_COMMUNITY): Payer: Self-pay | Admitting: Hematology and Oncology

## 2018-04-18 NOTE — Telephone Encounter (Signed)
Spoke with pt. Pt sts that her question had previously been answered. Nothing further needed at this time.    Caprice Beaver, LPN   37/4/45 1:46 PM  Note    Called patient, reordered BMP for one week out, and also metoprolol medication per instructions. Patient verbalized understanding.

## 2018-04-19 ENCOUNTER — Other Ambulatory Visit: Payer: Self-pay

## 2018-04-19 DIAGNOSIS — I2510 Atherosclerotic heart disease of native coronary artery without angina pectoris: Secondary | ICD-10-CM

## 2018-04-19 LAB — BASIC METABOLIC PANEL
BUN/Creatinine Ratio: 18 (ref 12–28)
BUN: 18 mg/dL (ref 8–27)
CO2: 26 mmol/L (ref 20–29)
Calcium: 9 mg/dL (ref 8.7–10.3)
Chloride: 96 mmol/L (ref 96–106)
Creatinine, Ser: 0.98 mg/dL (ref 0.57–1.00)
GFR calc Af Amer: 66 mL/min/{1.73_m2} (ref >59)
GFR calc non Af Amer: 57 mL/min/{1.73_m2} — ABNORMAL LOW (ref >59)
Glucose: 195 mg/dL — ABNORMAL HIGH (ref 65–99)
Potassium: 5.1 mmol/L (ref 3.5–5.2)
Sodium: 141 mmol/L (ref 134–144)

## 2018-04-25 ENCOUNTER — Ambulatory Visit (HOSPITAL_COMMUNITY)
Admission: RE | Admit: 2018-04-25 | Discharge: 2018-04-25 | Disposition: A | Discharge status: Home / Self Care | Payer: Medicare Other | Source: Ambulatory Visit | Attending: Internal Medicine | Admitting: Internal Medicine

## 2018-04-25 DIAGNOSIS — I2510 Atherosclerotic heart disease of native coronary artery without angina pectoris: Secondary | ICD-10-CM | POA: Diagnosis present

## 2018-04-25 MED ORDER — IOPAMIDOL (ISOVUE-370) INJECTION 76%
80.0000 mL | Freq: Once | INTRAVENOUS | Status: AC | PRN
  Administered 2018-04-25: 80 mL via INTRAVENOUS

## 2018-04-25 MED ORDER — METOPROLOL TARTRATE 5 MG/5ML IV SOLN
5.0000 mg | INTRAVENOUS | Status: DC | PRN
  Filled 2018-04-25: qty 5

## 2018-04-25 MED ORDER — NITROGLYCERIN 0.4 MG SL SUBL
0.8000 mg | SUBLINGUAL_TABLET | Freq: Once | SUBLINGUAL | Status: DC
  Filled 2018-04-25: qty 25

## 2018-04-27 ENCOUNTER — Telehealth: Payer: Self-pay

## 2018-04-27 MED ORDER — METOPROLOL SUCCINATE ER 25 MG PO TB24: 25.0000 mg | ORAL_TABLET | Freq: Every day | ORAL | 1 refills | Status: DC

## 2018-04-27 NOTE — Telephone Encounter (Signed)
Dr.Acharya has discussed the pt repeat Cor CT results with the pt directly. Per Dr.Acharya pt is to start Metoprolol Succinate 25mg  daily and have a f/u with her in 1 month. Pt is agreeable with Dr.Acharya's plan. RX sent to pt pharmacy for Metoprolol 25mg  daily #30 R-1. F/u with Dr.Acharya scheduled for 06/01/18 @ 10:40am.   Pt questions answered by Dr.A. Adv pt to contact the office if she has any concerns. Pt verbalized understanding.

## 2018-05-03 MED FILL — IBRANCE 125 MG CAPSULE: 125 | 28 days supply | Qty: 21 | Fill #1

## 2018-05-07 ENCOUNTER — Telehealth: Payer: Self-pay | Admitting: Hematology and Oncology

## 2018-05-07 NOTE — Telephone Encounter (Signed)
Scheduled appt per 12/23 sch message - left message for patient with appt date and time

## 2018-05-09 NOTE — Assessment & Plan Note (Signed)
Metastatic breast cancer: recently progressive in liver and bone, also pleura. OnFaslodex + Ibrance beginning 03/31/2016  Toxicities with therapy: 1. Neutropenia: Mild does not require dose adjustment. 2. injection site discomfort very mild 3. Fatigue related to [employer] molecular testing: Androgen receptor positive, PDL 1 neg, MSI stable, NTRK1/2/3Neg, T MBB intermediate 9 mutations/Mb, ESR 1-, PI 3 CA negative, BRCA negative

## 2018-05-10 ENCOUNTER — Inpatient Hospital Stay: Payer: Medicare Other | Attending: Hematology and Oncology | Admitting: Hematology and Oncology

## 2018-05-10 ENCOUNTER — Inpatient Hospital Stay: Payer: Medicare Other

## 2018-05-10 ENCOUNTER — Other Ambulatory Visit: Payer: Self-pay

## 2018-05-10 ENCOUNTER — Telehealth: Payer: Self-pay | Admitting: Hematology and Oncology

## 2018-05-10 VITALS — BP 149/82 | HR 94 | Temp 97.7°F | Resp 18

## 2018-05-10 DIAGNOSIS — C787 Secondary malignant neoplasm of liver and intrahepatic bile duct: Secondary | ICD-10-CM | POA: Diagnosis not present

## 2018-05-10 DIAGNOSIS — C50919 Malignant neoplasm of unspecified site of unspecified female breast: Secondary | ICD-10-CM | POA: Diagnosis not present

## 2018-05-10 DIAGNOSIS — C7951 Secondary malignant neoplasm of bone: Principal | ICD-10-CM

## 2018-05-10 DIAGNOSIS — M19071 Primary osteoarthritis, right ankle and foot: Secondary | ICD-10-CM

## 2018-05-10 DIAGNOSIS — C50911 Malignant neoplasm of unspecified site of right female breast: Secondary | ICD-10-CM

## 2018-05-10 DIAGNOSIS — C7800 Secondary malignant neoplasm of unspecified lung: Secondary | ICD-10-CM | POA: Diagnosis not present

## 2018-05-10 DIAGNOSIS — Z5112 Encounter for antineoplastic immunotherapy: Secondary | ICD-10-CM | POA: Diagnosis present

## 2018-05-10 DIAGNOSIS — M19072 Primary osteoarthritis, left ankle and foot: Secondary | ICD-10-CM

## 2018-05-10 LAB — CBC WITH DIFFERENTIAL (CANCER CENTER ONLY)
Abs Immature Granulocytes: 0.02 10*3/uL (ref 0.00–0.07)
Basophils Absolute: 0.1 10*3/uL (ref 0.0–0.1)
Basophils Relative: 2 %
Eosinophils Absolute: 0 10*3/uL (ref 0.0–0.5)
Eosinophils Relative: 1 %
HCT: 35.7 % — ABNORMAL LOW (ref 36.0–46.0)
Hemoglobin: 11.7 g/dL — ABNORMAL LOW (ref 12.0–15.0)
Immature Granulocytes: 1 %
Lymphocytes Relative: 34 %
Lymphs Abs: 0.9 10*3/uL (ref 0.7–4.0)
MCH: 34.7 pg — ABNORMAL HIGH (ref 26.0–34.0)
MCHC: 32.8 g/dL (ref 30.0–36.0)
MCV: 105.9 fL — ABNORMAL HIGH (ref 80.0–100.0)
Monocytes Absolute: 0.3 10*3/uL (ref 0.1–1.0)
Monocytes Relative: 12 %
Neutro Abs: 1.4 10*3/uL — ABNORMAL LOW (ref 1.7–7.7)
Neutrophils Relative %: 50 %
Platelet Count: 147 10*3/uL — ABNORMAL LOW (ref 150–400)
RBC: 3.37 MIL/uL — ABNORMAL LOW (ref 3.87–5.11)
RDW: 15.2 % (ref 11.5–15.5)
WBC Count: 2.8 10*3/uL — ABNORMAL LOW (ref 4.0–10.5)
nRBC: 0 % (ref 0.0–0.2)

## 2018-05-10 LAB — CMP (CANCER CENTER ONLY)
ALT: 11 U/L (ref 0–44)
AST: 20 U/L (ref 15–41)
Albumin: 3.4 g/dL — ABNORMAL LOW (ref 3.5–5.0)
Alkaline Phosphatase: 40 U/L (ref 38–126)
Anion gap: 9 (ref 5–15)
BUN: 9 mg/dL (ref 8–23)
CO2: 29 mmol/L (ref 22–32)
Calcium: 8.7 mg/dL — ABNORMAL LOW (ref 8.9–10.3)
Chloride: 104 mmol/L (ref 98–111)
Creatinine: 0.75 mg/dL (ref 0.44–1.00)
GFR, Est AFR Am: >60 mL/min (ref >60)
GFR, Estimated: >60 mL/min (ref >60)
Glucose, Bld: 212 mg/dL — ABNORMAL HIGH (ref 70–99)
Potassium: 4.9 mmol/L (ref 3.5–5.1)
Sodium: 142 mmol/L (ref 135–145)
Total Bilirubin: 0.9 mg/dL (ref 0.3–1.2)
Total Protein: 6.3 g/dL — ABNORMAL LOW (ref 6.5–8.1)

## 2018-05-10 MED ORDER — LETROZOLE 2.5 MG PO TABS: 2.5000 mg | ORAL_TABLET | Freq: Every day | ORAL | 0 refills | Status: DC

## 2018-05-10 MED ORDER — DENOSUMAB 120 MG/1.7ML ~~LOC~~ SOLN
SUBCUTANEOUS | Status: AC
  Filled 2018-05-10: qty 1.7

## 2018-05-10 MED ORDER — DENOSUMAB 120 MG/1.7ML ~~LOC~~ SOLN
120.0000 mg | Freq: Once | SUBCUTANEOUS | Status: AC
  Administered 2018-05-10: 120 mg via SUBCUTANEOUS

## 2018-05-10 MED ORDER — FULVESTRANT 250 MG/5ML IM SOLN
500.0000 mg | Freq: Once | INTRAMUSCULAR | Status: AC
  Administered 2018-05-10: 500 mg via INTRAMUSCULAR

## 2018-05-10 MED ORDER — FULVESTRANT 250 MG/5ML IM SOLN
INTRAMUSCULAR | Status: AC
  Filled 2018-05-10: qty 5

## 2018-05-10 MED ORDER — LETROZOLE 2.5 MG PO TABS: 2.5000 mg | ORAL_TABLET | Freq: Every day | ORAL | 3 refills | Status: DC

## 2018-05-10 NOTE — Progress Notes (Signed)
Patient Care Team: [name], MD as PCP - General (Family Medicine)  DIAGNOSIS:  Encounter Diagnosis  Name Primary?  . 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 BRCA reportedly normal.    08/25/2015 Relapse/Recurrence    new solitary liver lesion, biopsy showing metastatic ER PR + breast    09/25/2015 - 03/11/2016 Chemotherapy    Xeloda. Stopped due to progression of liver and bone metastases; pleural metastases    03/31/2016 -  Anti-estrogen oral therapy    Foslodex started 03/31/16, currently monthly  Ibrance 141m 3 weeks on, 1 week off started 03/31/16 Xgeva started on 05/05/16, currently every 3 months  Letrozole daily added 08/01/17     03/20/2018 Genetic Testing    Genetic testing performed through [facility]'s Multi-Cancer reported out on 03/19/2018 showed no pathogenic mutations. The Multi-Cancer Panel offered by [facility] includes sequencing and/or deletion duplication testing of the following 91 genes: AIP, ALK, APC, ATM, AXIN2, BAP1, BARD1, BLM, BMPR1A, BRCA1, BRCA2, BRIP1, BUB1B, CASR, CDC73, CDH1, CDK4, CDKN1B, CDKN1C, CDKN2A, CEBPA, CEP57, CHEK2, CTNNA1, DICER1, DIS3L2, EGFR, ENG, EPCAM, FH, FLCN, GALNT12, GATA2, GPC3, GREM1, HOXB13, HRAS, KIT, MAX, MEN1, MET, MITF, MLH1, MLH3, MSH2, MSH3, MSH6, MUTYH, NBN, NF1, NF2, NTHL1, PALB2, PDGFRA, PHOX2B, PMS2, POLD1, POLE, POT1, PRKAR1A, PTCH1, PTEN, RAD50, RAD51C,  RAD51D, RB1, RECQL4, RET, RNF43, RPS20, RUNX1, SDHA, SDHAF2, SDHB, SDHC, SDHD, SMAD4, SMARCA4, SMARCB1, SMARCE1, STK11, SUFU, TERC, TERT, TMEM127, TP53, TSC1, TSC2, VHL, WRN, WT1  A variant of uncertain significance (VUS) in a gene called MET was also noted. c.1669A>G (p.Thr557Ala)    04/08/2018 Miscellaneous    [facility] molecular testing: Androgen receptor positive, PDL 1 neg, MSI stable, NTRK1/2/3Neg, T MBB intermediate 9 mutations/Mb, ESR 1-, PI 3 CA negative, BRCA negative     Carcinoma of breast metastatic to bone ([place]   03/27/2016 Initial Diagnosis    [place] breast metastatic to bone ([facility]    03/31/2016 -  Chemotherapy    Foslodex started 03/31/16, currently monthly  Ibrance 1266m3 weeks on, 1 week off started 03/31/16 Daily letrozole added 08/01/17      Breast cancer metastasized to liver, unspecified laterality ([place]   03/27/2016 Initial Diagnosis    Breast cancer metastasized to liver, unspecified laterality ([facility]    CHIEF COMPLIANT: Metastatic breast cancer on Ibrance with Faslodex  INTERVAL HISTORY: RuKATRECE ROEDIGERs a [age]ear old with above-mentioned Sze metastatic breast cancer to bone liver and lung who is currently on Ibrance with Faslodex.  She is tolerating the treatment fairly well.  She has arthritis in her feet which limits her activities but otherwise she does not have any specific areas of bone pain.  REVIEW OF SYSTEMS:   Constitutional: Denies fevers, chills or abnormal weight loss Eyes: Denies blurriness of vision Ears, nose, mouth, throat, and face: Denies mucositis or sore throat Respiratory: Denies cough, dyspnea or wheezes Cardiovascular: Denies palpitation, chest discomfort Gastrointestinal:  Denies nausea, heartburn  or change in bowel habits Skin: Denies abnormal skin rashes Lymphatics: Denies new lymphadenopathy or easy bruising Neurological:Denies numbness, tingling or new weaknesses Behavioral/Psych: Mood is stable, no new changes   Extremities: Arthritis in the ankles causing some swelling   All other systems were reviewed with the patient and are negative.  I have reviewed the past medical history, past surgical history, social history and family history with the patient and they are unchanged from previous note.  ALLERGIES:  is allergic to sulfa antibiotics.  MEDICATIONS:  Current Outpatient Medications  Medication Sig Dispense Refill  . apixaban (ELIQUIS) 5 MG TABS tablet Take 1 tablet (5 mg total) by mouth 2 (two) times daily. 60 tablet 5  . Calcium Carb-Cholecalciferol (CALCIUM 600+D3) 600-800 MG-UNIT TABS Take 1 tablet by mouth daily.     . Cholecalciferol (VITAMIN D3) 2000 UNITS TABS Take 2,000 mg by mouth daily.    . ferrous sulfate 325 (65 FE) MG tablet Take 325 mg by mouth daily with breakfast.    . fish oil-omega-3 fatty acids 1000 MG capsule Take 1 g by mouth 2 (two) times daily.     . furosemide (LASIX) 20 MG tablet Take 20 mg by mouth daily as needed for fluid or edema. Reported on 05/07/2015    . gabapentin (NEURONTIN) 300 MG capsule Take 1 capsule (300 mg total) by mouth 2 (two) times daily.    Marland Kitchen glimepiride (AMARYL) 2 MG tablet Take 2 mg by mouth daily with breakfast.    . IBRANCE 125 MG capsule TAKE 1 CAPSULE (125 MG TOTAL) BY MOUTH DAILY WITH BREAKFAST. TAKE FOR 21 DAYS ON, 7 DAYS OFF. TAKE WHOLE WITH FOOD. 21 capsule 2  . ibuprofen (ADVIL,MOTRIN) 200 MG tablet Take 400 mg by mouth every 6 (six) hours as needed for headache (pain). Reported on 11/12/2015    . letrozole (FEMARA) 2.5 MG tablet Take 1 tablet (2.5 mg total) by mouth daily. 90 tablet 3  . metFORMIN (GLUCOPHAGE) 500 MG tablet Take 500 mg by mouth 2 (two) times daily with a meal.      . metoprolol succinate (TOPROL XL) 25 MG 24 hr tablet Take 1 tablet (25 mg total) by mouth daily. 30 tablet 1  . simvastatin (ZOCOR) 40 MG tablet Take 40 mg by mouth at bedtime.       No current facility-administered medications for this visit.     Facility-Administered Medications Ordered in Other Visits  Medication Dose Route Frequency Provider Last Rate Last Dose  . denosumab (XGEVA) injection 120 mg  120 mg Subcutaneous Once Nicholas Lose, MD      . fulvestrant (FASLODEX) injection 500 mg  500 mg Intramuscular Once Nicholas Lose, MD        PHYSICAL EXAMINATION: ECOG PERFORMANCE STATUS: 1 - Symptomatic but completely ambulatory  Vitals:   05/10/18 0944  BP: (!) 171/98  Pulse: 87  Resp: 18  Temp: 97.8 F (36.6 C)  SpO2: 95%   Filed Weights   05/10/18 0944  Weight: 217 lb 9.6 oz (98.7 kg)    GENERAL:alert, no distress and comfortable SKIN: skin color, texture, turgor are normal, no rashes or significant lesions EYES: normal, Conjunctiva are pink and non-injected, sclera clear OROPHARYNX:no exudate, no erythema and lips, buccal mucosa, and tongue normal  NECK: supple, thyroid normal size, non-tender, without nodularity LYMPH:  no palpable lymphadenopathy in the cervical, axillary or inguinal LUNGS: clear to auscultation and percussion with normal breathing effort HEART: regular rate & rhythm and no murmurs and no  lower extremity edema ABDOMEN:abdomen soft, non-tender and normal bowel sounds MUSCULOSKELETAL:no cyanosis of digits and no clubbing  NEURO: alert & oriented x 3 with fluent speech, no focal motor/sensory deficits EXTREMITIES: No lower extremity edema    LABORATORY DATA:  I have reviewed the data as listed CMP Latest Ref Rng & Units 05/10/2018 04/19/2018 04/10/2018  Glucose 70 - 99 mg/dL 212(H) 195(H) 75  BUN 8 - 23 mg/dL '9 18 9  ' Creatinine 0.44 - 1.00 mg/dL 0.75 0.98 0.68  Sodium 135 - 145 mmol/L 142 141 142  Potassium 3.5 - 5.1 mmol/L 4.9 5.1 4.6  Chloride 98 - 111 mmol/L 104 96 105  CO2 22 - 32 mmol/L '29 26 28  ' Calcium 8.9 - 10.3 mg/dL 8.7(L) 9.0 8.9  Total Protein 6.5 - 8.1 g/dL 6.3(L) - 6.3(L)  Total Bilirubin 0.3 - 1.2 mg/dL 0.9 - 0.8  Alkaline Phos 38 - 126 U/L 40 - 45  AST 15 - 41 U/L 20 -  25  ALT 0 - 44 U/L 11 - 11    Lab Results  Component Value Date   WBC 2.8 (L) 05/10/2018   HGB 11.7 (L) 05/10/2018   HCT 35.7 (L) 05/10/2018   MCV 105.9 (H) 05/10/2018   PLT 147 (L) 05/10/2018   NEUTROABS 1.4 (L) 05/10/2018    ASSESSMENT & PLAN:  Breast cancer metastasized to liver [facility]) Metastatic breast cancer: recently progressive in liver and bone, also pleura. OnFaslodex + Ibrance beginning 03/31/2016  Toxicities with therapy: 1. Neutropenia: Mild does not require dose adjustment. 2. injection site discomfort very mild 3. Fatigue related to [employer] molecular testing: Androgen receptor positive, PDL 1 neg, MSI stable, NTRK1/2/3Neg, T MBB intermediate 9 mutations/Mb, ESR 1-, PI 3 CA negative, BRCA negative  PET CT scan will be obtained in 2 months and follow-up after that. Previous scans showed slight progression of disease and I am concerned about further progression. She will be scheduled for monthly Faslodex and every 26-monthXgeva injections.  Orders Placed This Encounter  Procedures  . NM PET Image Restag (PS) Skull Base To Thigh    Standing Status:   Future    Standing Expiration Date:   05/10/2019    Order Specific Question:   ** REASON FOR EXAM (FREE TEXT)    Answer:   Met Breast cancer    Order Specific Question:   If indicated for the ordered procedure, I authorize the administration of a radiopharmaceutical per Radiology protocol    Answer:   Yes    Order Specific Question:   [facility]   Order Specific Question:   Radiology Contrast Protocol - do NOT remove file path    Answer:   [email or web address]   The patient has a good understanding of the overall plan. she agrees with it. she will call with any problems that may develop before the next visit here.   VHarriette Ohara MD 05/10/18

## 2018-05-10 NOTE — Telephone Encounter (Signed)
Scheduled appt per 12/26 los - gave patient AVS and calender per los.  [facility] to contact patient with scan appt.  Pt aware to call [facility] radiology if they have not heard from them

## 2018-05-14 ENCOUNTER — Telehealth: Payer: Self-pay

## 2018-05-14 NOTE — Telephone Encounter (Signed)
Oral Oncology Patient Advocate Encounter  The assistance fund grant will expire on 05/15/18. There are currently no other grant fund available for her disease state and we cannot renew at this time.   I spoke to the patient and I will fill out a [employer] application and try to get manufacturer assistance. She will bring in her income documents on 05/15/18. Brittany Porter verbalized understanding and great appreciation.  This encounter will be updated until final determination  [place] Patient [place] Phone [phone] Fax [phone]

## 2018-05-17 NOTE — Telephone Encounter (Signed)
Oral Oncology Patient Advocate Encounter  Cancer care grant opened up so I cancelled the [place] application.   I was successful at securing a grant with Cancer care for $6,000. This will keep the out of pocket expense for [place] at $0. The grant information is as follows and has been shared with [facility].  Approval dates: 05/17/18-05/18/19 ID: [id number] Group: [id number] BIN: [id number] PCN: [id number]  I called the patient to give her the good news and had to leave a voicemail.   [place] Patient [place] Phone [phone] Fax [phone]

## 2018-05-24 ENCOUNTER — Encounter: Payer: Self-pay | Admitting: Hematology and Oncology

## 2018-05-24 NOTE — Progress Notes (Signed)
Calledpt tomake her aware of open funding for breast pts. She gave me consent to apply in her behalf so I applied to thePatient Advocate Foundationand she was approvedfor $16,000. The standard award is $2,500 and the additional $13,500 is accessible on a first-come, first- served basis as long as funding remains available in the [place] with a 6 month look back periodfrom 05/24/18.

## 2018-05-30 MED FILL — IBRANCE 125 MG CAPSULE: 125 | 28 days supply | Qty: 21 | Fill #2

## 2018-06-01 ENCOUNTER — Encounter: Payer: Self-pay | Admitting: Internal Medicine

## 2018-06-01 ENCOUNTER — Ambulatory Visit: Payer: Medicare Other | Admitting: Internal Medicine

## 2018-06-01 VITALS — BP 98/74 | HR 71 | Ht 63.5 in | Wt 210.6 lb

## 2018-06-01 DIAGNOSIS — Z8543 Personal history of malignant neoplasm of ovary: Secondary | ICD-10-CM

## 2018-06-01 DIAGNOSIS — I5042 Chronic combined systolic (congestive) and diastolic (congestive) heart failure: Secondary | ICD-10-CM | POA: Diagnosis not present

## 2018-06-01 DIAGNOSIS — Z8542 Personal history of malignant neoplasm of other parts of uterus: Secondary | ICD-10-CM | POA: Diagnosis not present

## 2018-06-01 DIAGNOSIS — C50919 Malignant neoplasm of unspecified site of unspecified female breast: Secondary | ICD-10-CM | POA: Diagnosis not present

## 2018-06-01 DIAGNOSIS — Z85820 Personal history of malignant melanoma of skin: Secondary | ICD-10-CM

## 2018-06-01 DIAGNOSIS — C7951 Secondary malignant neoplasm of bone: Secondary | ICD-10-CM

## 2018-06-01 MED ORDER — RIVAROXABAN 20 MG PO TABS: 20.0000 mg | ORAL_TABLET | Freq: Every day | ORAL | 11 refills | Status: DC

## 2018-06-01 NOTE — Progress Notes (Signed)
Cardiology Office Note:    Date:  06/01/2018   ID:  Brittany Porter, DOB Aug 07, 1945, MRN [id number]  PCP:  [name], MD  Cardiologist:  No primary care provider on file.  Electrophysiologist:  None   Referring MD: [name], MD   Follow-up  History of Present Illness:    Brittany Porter is a 73 y.o. female with a hx of breast cancer, endometrial cancer, melanoma, and ovarian cancer.  She is on maintenance therapy for breast cancer.  She has a history of atrial flutter for which she takes Eliquis 5 mg twice daily.  I am seeing her in follow-up for a mildly reduced ejection fraction on echocardiogram obtained in September 2019 with an estimated ejection fraction of 40 to 45% and hypokinesis of the inferolateral myocardium.  I do note coronary artery calcifications on a previous PET scan.  We have attempted to obtain a CT coronary angiogram for Brittany Porter.  This study was limited by poor perfusion of contrast and lack of opacification of the coronary arteries.  A repeat study was attempted and unfortunately the same issue occurred.  Brittany Porter has recently had an episode of intolerance to prednisone provided for joint pain, and she is still recovering from how poorly she felt while taking this medicine.  Otherwise she is without significant symptoms.  She says some days are better than others where she will have more energy and less shortness of breath, and other days are more difficult.  She also struggles with joint pain and mobility limitations.  She denies exertional chest discomfort, worsening shortness of breath, palpitations, PND, orthopnea, or significant leg swelling.  She takes as needed furosemide for lower extremity swelling.  She denies fevers, chills, cough.  After difficulties obtaining a diagnostic quality CT coronary angiogram, we participated in shared decision making and decided to start medical management of presumed coronary artery disease, and initiated metoprolol  succinate 25 mg daily.   Past Medical History:  Diagnosis Date  . Arthritis   . Breast cancer ([place])   . Diabetes mellitus   . Endometrial carcinoma ([place]) 10/2008  . Family history of breast cancer   . Family history of colon cancer   . Family history of skin cancer   . Fluid overload   . History of radiation therapy 04/13/2009, 04/16/2009, 04/27/2009, 05/07/2009, 05/18/2009   3000 cGy to proximal vagina  . Hyperlipidemia   . Iron deficiency   . Melanoma ([place]) 2010   rt arm and back  . Ovarian cancer ([place])   . Pleural effusion 04/2010    Past Surgical History:  Procedure Laterality Date  . ABDOMINAL HYSTERECTOMY  10/2008  . INSERTION OF MESH  05/28/2012   Procedure: INSERTION OF MESH;  Surgeon: Adin Hector, MD;  Location: [facility];  Service: General;  Laterality: N/A;  . KNEE ARTHROSCOPY  04/2010   Right knee  . LAPAROSCOPIC LYSIS OF ADHESIONS  05/28/2012   Procedure: LAPAROSCOPIC LYSIS OF ADHESIONS;  Surgeon: Adin Hector, MD;  Location: [facility];  Service: General;  Laterality: N/A;  . MASTECTOMY  1997   Bilateral with lymph nodes  . Melanoma removal  02/2009, 07/2009  . VENTRAL HERNIA REPAIR  05/28/2012   Procedure: LAPAROSCOPIC VENTRAL HERNIA;  Surgeon: Adin Hector, MD;  Location: [facility];  Service: General;  Laterality: N/A;    Current Medications: Current Meds  Medication Sig  . Calcium Carb-Cholecalciferol (CALCIUM 600+D3) 600-800 MG-UNIT TABS Take 1 tablet by mouth daily.   Marland Kitchen  Cholecalciferol (VITAMIN D3) 2000 UNITS TABS Take 2,000 mg by mouth daily.  . ferrous sulfate 325 (65 FE) MG tablet Take 325 mg by mouth daily with breakfast.  . fish oil-omega-3 fatty acids 1000 MG capsule Take 1 g by mouth 2 (two) times daily.   . furosemide (LASIX) 20 MG tablet Take 20 mg by mouth daily as needed for fluid or edema. Reported on 05/07/2015  . gabapentin (NEURONTIN) 300 MG capsule Take 1 capsule (300 mg total) by mouth 2 (two) times daily.  Marland Kitchen glimepiride (AMARYL) 2 MG tablet  Take 2 mg by mouth daily with breakfast.  . IBRANCE 125 MG capsule TAKE 1 CAPSULE (125 MG TOTAL) BY MOUTH DAILY WITH BREAKFAST. TAKE FOR 21 DAYS ON, 7 DAYS OFF. TAKE WHOLE WITH FOOD.  Marland Kitchen ibuprofen (ADVIL,MOTRIN) 200 MG tablet Take 400 mg by mouth every 6 (six) hours as needed for headache (pain). Reported on 11/12/2015  . letrozole (FEMARA) 2.5 MG tablet Take 1 tablet (2.5 mg total) by mouth daily.  . metFORMIN (GLUCOPHAGE) 500 MG tablet Take 500 mg by mouth 2 (two) times daily with a meal.    . metoprolol succinate (TOPROL XL) 25 MG 24 hr tablet Take 1 tablet (25 mg total) by mouth daily.  . simvastatin (ZOCOR) 40 MG tablet Take 40 mg by mouth at bedtime.    . [DISCONTINUED] apixaban (ELIQUIS) 5 MG TABS tablet Take 1 tablet (5 mg total) by mouth 2 (two) times daily.     Allergies:   Prednisone and Sulfa antibiotics   Social History   Socioeconomic History  . Marital status: Married    Spouse name: Not on file  . Number of children: Not on file  . Years of education: Not on file  . Highest education level: Not on file  Occupational History  . Not on file  Social Needs  . Financial resource strain: Not on file  . Food insecurity:    Worry: Not on file    Inability: Not on file  . Transportation needs:    Medical: Not on file    Non-medical: Not on file  Tobacco Use  . Smoking status: Never Smoker  . Smokeless tobacco: Never Used  Substance and Sexual Activity  . Alcohol use: No  . Drug use: No  . Sexual activity: Yes  Lifestyle  . Physical activity:    Days per week: Not on file    Minutes per session: Not on file  . Stress: Not on file  Relationships  . Social connections:    Talks on phone: Not on file    Gets together: Not on file    Attends religious service: Not on file    Active member of club or organization: Not on file    Attends meetings of clubs or organizations: Not on file    Relationship status: Not on file  Other Topics Concern  . Not on file  Social  History Narrative  . Not on file     Family History: The patient's family history includes Breast cancer in her sister; Cancer in her father and sister; Colon cancer in her sister; Colon cancer (age of onset: 36) in her sister; Dementia in her sister; Heart attack in her brother; Skin cancer (age of onset: 43) in her brother; Stroke in her brother, mother, and sister.  ROS:   Please see the history of present illness.    All other systems reviewed and are negative.  EKGs/Labs/Other Studies Reviewed:  The following studies were reviewed today:  EKG: Not obtained today  Recent Labs: 01/09/2018: Magnesium 1.2; TSH 3.797 05/10/2018: ALT 11; BUN 9; Creatinine 0.75; Hemoglobin 11.7; Platelet Count 147; Potassium 4.9; Sodium 142  Recent Lipid Panel    Component Value Date/Time   CHOL 153 12/23/2008 1019   TRIG 92 12/23/2008 1019   HDL 60 12/23/2008 1019   CHOLHDL 2.6 12/23/2008 1019   VLDL 18 12/23/2008 1019   LDLCALC 75 12/23/2008 1019    Physical Exam:    VS:  BP 98/74   Pulse 71   Ht 5' 3.5" (1.613 m)   Wt 210 lb 9.6 oz (95.5 kg)   SpO2 97%   BMI 36.72 kg/m     Wt Readings from Last 3 Encounters:  06/01/18 210 lb 9.6 oz (95.5 kg)  05/10/18 217 lb 9.6 oz (98.7 kg)  04/10/18 220 lb 9.6 oz (100.1 kg)     Constitutional: No acute distress Eyes: pupils equally round and reactive to light, sclera non-icteric, normal conjunctiva and lids ENMT: normal dentition, moist mucous membranes Cardiovascular: regular rhythm, normal rate, no murmurs. S1 and S2 normal. Radial pulses normal bilaterally. No jugular venous distention.  Respiratory: clear to auscultation bilaterally GI : normal bowel sounds, soft and nontender. No distention.   MSK: extremities warm, well perfused. No edema.  NEURO: grossly nonfocal exam, moves all extremities. PSYCH: alert and oriented x 3, normal mood and affect.   ASSESSMENT:    1. Chronic combined systolic and diastolic heart failure (HCC)   2.  Carcinoma of breast metastatic to bone, unspecified laterality ([place])   3. History of endometrial cancer   4. History of ovarian cancer   5. History of melanoma    PLAN:   Brittany Porter has a history of breast cancer with chemotherapy as well as coronary artery calcifications on a PET scan.  Am concerned that there may be cardiomyopathy versus chemotherapy cardiotoxicity as the source of her reduced ejection fraction.  We have tried to noninvasively investigate her coronary artery plaque burden, however we were unsuccessful at adequate contrast perfusion of the coronary arteries for diagnostic quality images.  I participated in shared decision making with the patient by phone as well as in our visit today regarding next steps.  We have discussed medical management of coronary artery disease plus therapy for reduced ejection fraction in the setting of possible chemotherapy associated cardiotoxicity.  I would be interested to know from her oncologist whether she received any cardiotoxic agents in the past for therapy for her multiple malignancies.  She is currently tolerating metoprolol succinate 25 mg daily quite well.  Her blood pressure is marginal today, however she states it is usually slightly better.  However I do have concerns about starting goal-directed medical therapy for heart failure with lisinopril given her marginal blood pressure.  We can discuss this at follow-up after she has fully recovered from her recent intolerance to prednisone.  She agrees with this.  She has multiple medical visits in the interim, and have encouraged her to speak to any of her physicians about her concerns or to call me and be seen sooner than 6 months which is when we have arranged follow-up.  In addition she notes diarrhea with Eliquis, we will attempt to switch her to Xarelto.  She should contact us if her insurance coverage is inadequate for this switch.  Medication Adjustments/Labs and Tests Ordered: Current  medicines are reviewed at length with the patient today.  Concerns regarding  medicines are outlined above.  No orders of the defined types were placed in this encounter.  Meds ordered this encounter  Medications  . rivaroxaban (XARELTO) 20 MG TABS tablet    Sig: Take 1 tablet (20 mg total) by mouth daily with supper.    Dispense:  30 tablet    Refill:  11    Patient Instructions  Medication Instructions:  Your physician has recommended you make the following change in your medication:  1) STOP Elquis 2) START Xarelto 20mg  daily If you need a refill on your cardiac medications before your next appointment, please call your pharmacy.   Lab work: None ordered If you have labs (blood work) drawn today and your tests are completely normal, you will receive your results only by: Marland Kitchen MyChart Message (if you have MyChart) OR . A paper copy in the mail If you have any lab test that is abnormal or we need to change your treatment, we will call you to review the results.  Testing/Procedures: None ordered  Follow-Up: At [facility] you and your health needs are our priority.  As part of our continuing mission to provide you with exceptional heart care, we have created designated Provider Care Teams.  These Care Teams include your primary Cardiologist (physician) and Advanced Practice Providers (APPs -  Physician Assistants and Nurse Practitioners) who all work together to provide you with the care you need, when you need it. You will need a follow up appointment in 6 months.  Please call our office 2 months in advance to schedule this appointment.  You may see  Dr. Margaretann Loveless or one of the following Advanced Practice Providers on your designated Care Team:   Rosaria Ferries, PA-C . Jory Sims, DNP, ANP  Any Other Special Instructions Will Be Listed Below (If Applicable). Please let us know if Xarelto is no cost effective and we will switch you back to Eliquis     Signed, Elouise Munroe, MD  06/01/2018 2:15 PM    [facility]

## 2018-06-01 NOTE — Patient Instructions (Addendum)
Medication Instructions:  Your physician has recommended you make the following change in your medication:  1) STOP Elquis 2) START Xarelto 20mg  daily If you need a refill on your cardiac medications before your next appointment, please call your pharmacy.   Lab work: None ordered If you have labs (blood work) drawn today and your tests are completely normal, you will receive your results only by: Marland Kitchen MyChart Message (if you have MyChart) OR . A paper copy in the mail If you have any lab test that is abnormal or we need to change your treatment, we will call you to review the results.  Testing/Procedures: None ordered  Follow-Up: At [facility], you and your health needs are our priority.  As part of our continuing mission to provide you with exceptional heart care, we have created designated Provider Care Teams.  These Care Teams include your primary Cardiologist (physician) and Advanced Practice Providers (APPs -  Physician Assistants and Nurse Practitioners) who all work together to provide you with the care you need, when you need it. You will need a follow up appointment in 6 months.  Please call our office 2 months in advance to schedule this appointment.  You may see  Dr. Margaretann Loveless or one of the following Advanced Practice Providers on your designated Care Team:   Rosaria Ferries, PA-C . Jory Sims, DNP, ANP  Any Other Special Instructions Will Be Listed Below (If Applicable). Please let us know if Xarelto is no cost effective and we will switch you back to Eliquis

## 2018-06-06 ENCOUNTER — Encounter (HOSPITAL_COMMUNITY): Payer: Self-pay | Admitting: Emergency Medicine

## 2018-06-06 ENCOUNTER — Emergency Department (HOSPITAL_COMMUNITY): Payer: Medicare Other

## 2018-06-06 ENCOUNTER — Emergency Department (HOSPITAL_COMMUNITY)
Admission: EM | Admit: 2018-06-06 | Discharge: 2018-06-06 | Disposition: A | Discharge status: Home / Self Care | Payer: Medicare Other | Attending: Emergency Medicine | Admitting: Emergency Medicine

## 2018-06-06 DIAGNOSIS — I4891 Unspecified atrial fibrillation: Secondary | ICD-10-CM | POA: Diagnosis present

## 2018-06-06 LAB — CBC
HCT: 34.7 % — ABNORMAL LOW (ref 36.0–46.0)
Hemoglobin: 11.4 g/dL — ABNORMAL LOW (ref 12.0–15.0)
MCH: 35.5 pg — ABNORMAL HIGH (ref 26.0–34.0)
MCHC: 32.9 g/dL (ref 30.0–36.0)
MCV: 108.1 fL — ABNORMAL HIGH (ref 80.0–100.0)
Platelets: 242 10*3/uL (ref 150–400)
RBC: 3.21 MIL/uL — ABNORMAL LOW (ref 3.87–5.11)
RDW: 15.3 % (ref 11.5–15.5)
WBC: 4.9 10*3/uL (ref 4.0–10.5)
nRBC: 0.4 % — ABNORMAL HIGH (ref 0.0–0.2)

## 2018-06-06 LAB — TSH: TSH: 3.882 u[IU]/mL (ref 0.350–4.500)

## 2018-06-06 LAB — MAGNESIUM: Magnesium: 0.9 mg/dL — CL (ref 1.7–2.4)

## 2018-06-06 LAB — BASIC METABOLIC PANEL
Anion gap: 14 (ref 5–15)
BUN: 10 mg/dL (ref 8–23)
CO2: 23 mmol/L (ref 22–32)
Calcium: 8.4 mg/dL — ABNORMAL LOW (ref 8.9–10.3)
Chloride: 102 mmol/L (ref 98–111)
Creatinine, Ser: 1.02 mg/dL — ABNORMAL HIGH (ref 0.44–1.00)
GFR calc Af Amer: >60 mL/min (ref >60)
GFR calc non Af Amer: 54 mL/min — ABNORMAL LOW (ref >60)
Glucose, Bld: 231 mg/dL — ABNORMAL HIGH (ref 70–99)
Potassium: 4.7 mmol/L (ref 3.5–5.1)
Sodium: 139 mmol/L (ref 135–145)

## 2018-06-06 LAB — PROTIME-INR
INR: 2.08
Prothrombin Time: 23.1 seconds — ABNORMAL HIGH (ref 11.4–15.2)

## 2018-06-06 MED ORDER — METOPROLOL TARTRATE 5 MG/5ML IV SOLN
INTRAVENOUS | Status: AC
  Filled 2018-06-06: qty 5

## 2018-06-06 MED ORDER — DILTIAZEM HCL-DEXTROSE 100-5 MG/100ML-% IV SOLN (PREMIX)
5.0000 mg/h | INTRAVENOUS | Status: DC
  Administered 2018-06-06: 5 mg/h via INTRAVENOUS
  Filled 2018-06-06: qty 100

## 2018-06-06 MED ORDER — MAGNESIUM OXIDE -MG SUPPLEMENT 200 MG PO TABS: 1.0000 | ORAL_TABLET | Freq: Every day | ORAL | 0 refills | Status: AC

## 2018-06-06 MED ORDER — MAGNESIUM SULFATE 2 GM/50ML IV SOLN
2.0000 g | Freq: Once | INTRAVENOUS | Status: AC
  Administered 2018-06-06: 2 g via INTRAVENOUS
  Filled 2018-06-06: qty 50

## 2018-06-06 MED ORDER — PROPOFOL 10 MG/ML IV BOLUS
0.5000 mg/kg | Freq: Once | INTRAVENOUS | Status: AC
  Administered 2018-06-06: 47 mg via INTRAVENOUS
  Filled 2018-06-06: qty 20

## 2018-06-06 MED ORDER — SODIUM CHLORIDE 0.9% FLUSH
3.0000 mL | Freq: Once | INTRAVENOUS | Status: AC
  Administered 2018-06-06: 3 mL via INTRAVENOUS

## 2018-06-06 MED ORDER — DILTIAZEM LOAD VIA INFUSION
20.0000 mg | Freq: Once | INTRAVENOUS | Status: AC
  Administered 2018-06-06: 20 mg via INTRAVENOUS
  Filled 2018-06-06: qty 20

## 2018-06-06 MED ORDER — METOPROLOL TARTRATE 5 MG/5ML IV SOLN
5.0000 mg | Freq: Once | INTRAVENOUS | Status: AC
  Administered 2018-06-06: 5 mg via INTRAVENOUS

## 2018-06-06 NOTE — ED Notes (Signed)
Consent obtained for cardioversion and moderate sedation

## 2018-06-06 NOTE — ED Notes (Signed)
150J shock given

## 2018-06-06 NOTE — ED Triage Notes (Signed)
Pt states she had an allergic reaction two weeks ago to prednisone. Pt has not felt well since. Pt has been feeling SOB since yesterday. Pt went to MD office and was found to be in Afib; with hx of this.

## 2018-06-06 NOTE — ED Provider Notes (Signed)
[facility] EMERGENCY DEPARTMENT Provider Note   CSN: [id number] Arrival date & time: 06/06/18  1513     History   Chief Complaint Chief Complaint  Patient presents with  . Atrial Fibrillation    HPI Brittany Porter is a 73 y.o. female.  Pt presents to the ED today in afib with RVR.  Pt took prednisone last week and felt bad after taking 3 doses of it.  She has a hx of 1 episode of afib in September.  She has been on Eliquis since then.  She is followed by Dr. Margaretann Loveless and last saw her on 1/17.  She went to her doctor's office today for a check up for the prednisone.  Her doctor realized that she was in afib and sent her here.  The pt did not recognize it.  Pt denies cp or sob.  CHA2DS2/VAS Stroke Risk Points  Current as of 9 minutes ago     5 >= 2 Points: High Risk  1 - 1.99 Points: Medium Risk  0 Points: Low Risk    The previous score was 4 on 02/17/2018.:  Last Change:     Details    This score determines the patient's risk of having a stroke if the  patient has atrial fibrillation.       Points Metrics  1 Has Congestive Heart Failure:  Yes    Current as of 9 minutes ago  1 Has Vascular Disease:  Yes    Current as of 9 minutes ago  0 Has Hypertension:  No    Current as of 9 minutes ago  1 Age:  24    Current as of 9 minutes ago  1 Has Diabetes:  Yes    Current as of 9 minutes ago  0 Had Stroke:  No  Had TIA:  No  Had thromboembolism:  No    Current as of 9 minutes ago  1 Female:  Yes    Current as of 9 minutes ago                 Past Medical History:  Diagnosis Date  . Arthritis   . Breast cancer ([place])   . Diabetes mellitus   . Endometrial carcinoma ([place]) 10/2008  . Family history of breast cancer   . Family history of colon cancer   . Family history of skin cancer   . Fluid overload   . History of radiation therapy 04/13/2009, 04/16/2009, 04/27/2009, 05/07/2009, 05/18/2009   3000 cGy to proximal vagina  . Hyperlipidemia   . Iron  deficiency   . Melanoma ([place]) 2010   rt arm and back  . Ovarian cancer ([place])   . Pleural effusion 04/2010    Patient Active Problem List   Diagnosis Date Noted  . Genetic testing 03/21/2018  . Family history of breast cancer   . Family history of colon cancer   . Family history of skin cancer   . 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 Breast cancer metastasized to multiple sites, unspecified laterality ([place]) 03/27/2016  . History of ovarian cancer 03/27/2016  . History of endometrial cancer 03/27/2016  . History of melanoma 03/27/2016  . Poor venous access 03/27/2016  . Encounter for antineoplastic chemotherapy 10/02/2015  . Breast cancer metastasized to multiple sites ([place]) 10/02/2015  . Breast cancer metastasized to liver ([place]) 09/27/2015  . Liver lesion, right lobe 09/06/2015  . Capsulitis 07/23/2015  . DDD (degenerative disc disease),  lumbar 06/27/2015  . Ovarian CA, unspecified laterality ([place]) 05/08/2015  . Osteoarthritis of ankle and foot 04/01/2015  . Morbid obesity with BMI of 40.0-44.9, adult ([place]) 03/29/2015  . Bilateral malignant neoplasm of breast in female [facility]) 03/29/2015  . Breast cancer metastasized to pleura ([place]) 12/11/2014  . Obesity (BMI 30-39.9) 12/11/2014  . 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 High risk medication use 12/11/2014  . Bilateral breast cancer ([place]) 09/25/2013  . DM2 (diabetes mellitus, type 2) ([place]) 01/21/2012  . Hyperlipidemia 01/21/2012  . Melanoma of upper back excluding scapular region ([place]) 09/22/2011  . Ovarian cancer ([place]) 08/09/2011  . Uterine carcinoma ([place]) 08/09/2011    Past Surgical History:  Procedure Laterality Date  . ABDOMINAL HYSTERECTOMY  10/2008  . INSERTION OF MESH  05/28/2012   Procedure: INSERTION OF MESH;  Surgeon: Adin Hector, MD;  Location: [facility];  Service: General;  Laterality: N/A;  . KNEE ARTHROSCOPY  04/2010   Right knee  . LAPAROSCOPIC LYSIS OF ADHESIONS  05/28/2012    Procedure: LAPAROSCOPIC LYSIS OF ADHESIONS;  Surgeon: Adin Hector, MD;  Location: [facility];  Service: General;  Laterality: N/A;  . MASTECTOMY  1997   Bilateral with lymph nodes  . Melanoma removal  02/2009, 07/2009  . VENTRAL HERNIA REPAIR  05/28/2012   Procedure: LAPAROSCOPIC VENTRAL HERNIA;  Surgeon: Adin Hector, MD;  Location: [facility];  Service: General;  Laterality: N/A;     OB History   No obstetric history on file.      Home Medications    Prior to Admission medications   Medication Sig Start Date End Date Taking? Authorizing Provider  apixaban (ELIQUIS) 2.5 MG TABS tablet Take 2.5 mg by mouth 2 (two) times daily.   Yes [provider]  Calcium Carb-Cholecalciferol (CALCIUM 600+D3) 600-800 MG-UNIT TABS Take 1 tablet by mouth daily.    Yes [provider]  Cholecalciferol (VITAMIN D3) 2000 UNITS TABS Take 2,000 mg by mouth daily.   Yes [provider]  ferrous sulfate 325 (65 FE) MG tablet Take 325 mg by mouth daily with breakfast.   Yes [provider]  fish oil-omega-3 fatty acids 1000 MG capsule Take 1 g by mouth 2 (two) times daily.    Yes [provider]  furosemide (LASIX) 20 MG tablet Take 20 mg by mouth daily as needed for fluid or edema. Reported on 05/07/2015 10/08/14  Yes [provider]  gabapentin (NEURONTIN) 300 MG capsule Take 1 capsule (300 mg total) by mouth 2 (two) times daily. 04/10/18  Yes Nicholas Lose, MD  glimepiride (AMARYL) 2 MG tablet Take 2 mg by mouth daily with breakfast.   Yes [provider]  IBRANCE 125 MG capsule TAKE 1 CAPSULE (125 MG TOTAL) BY MOUTH DAILY WITH BREAKFAST. TAKE FOR 21 DAYS ON, 7 DAYS OFF. TAKE WHOLE WITH FOOD. Patient taking differently: Take 125 mg by mouth daily with breakfast. Take for 21 days on, 7 days off. Take whole with food. 03/28/18  Yes Nicholas Lose, MD  ibuprofen (ADVIL,MOTRIN) 200 MG tablet Take 400 mg by mouth every 6 (six) hours as needed for headache  (pain). Reported on 11/12/2015   Yes [provider]  letrozole (FEMARA) 2.5 MG tablet Take 1 tablet (2.5 mg total) by mouth daily. 05/10/18  Yes Nicholas Lose, MD  metFORMIN (GLUCOPHAGE) 500 MG tablet Take 500 mg by mouth 2 (two) times daily with a meal.     Yes [provider]  metoprolol succinate (TOPROL  XL) 25 MG 24 hr tablet Take 1 tablet (25 mg total) by mouth daily. 04/27/18  Yes Elouise Munroe, MD  simvastatin (ZOCOR) 40 MG tablet Take 40 mg by mouth at bedtime.     Yes [provider]  rivaroxaban (XARELTO) 20 MG TABS tablet Take 1 tablet (20 mg total) by mouth daily with supper. Patient not taking: Reported on 06/06/2018 06/01/18   Elouise Munroe, MD    Family History Family History  Problem Relation Age of Onset  . Stroke Mother   . Cancer Father        'voice box'  . Colon cancer Sister        dx >50  . Heart attack Brother   . Skin cancer Brother 44  . Cancer Sister        primary site unk, dx >50  . Stroke Brother   . Stroke Sister   . Colon cancer Sister 54  . Dementia Sister   . Breast cancer Sister        dx >5-, met to brain    Social History Social History   Tobacco Use  . Smoking status: Never Smoker  . Smokeless tobacco: Never Used  Substance Use Topics  . Alcohol use: No  . Drug use: No     Allergies   Prednisone; Prednisolone; and Sulfa antibiotics   Review of Systems Review of Systems  Neurological: Positive for weakness.  All other systems reviewed and are negative.    Physical Exam Updated Vital Signs BP (!) 120/109   Pulse 91   Temp 98.2 F (36.8 C) (Oral)   Resp (!) 23   Ht '5\' 3"'  (1.6 m)   Wt 93.9 kg   SpO2 97%   BMI 36.67 kg/m   Physical Exam Vitals signs and nursing note reviewed.  Constitutional:      Appearance: Normal appearance. She is obese.  HENT:     Head: Normocephalic and atraumatic.     Right Ear: External ear normal.     Left Ear: External ear normal.     Nose: Nose  normal.     Mouth/Throat:     Mouth: Mucous membranes are moist.     Pharynx: Oropharynx is clear.  Eyes:     Extraocular Movements: Extraocular movements intact.     Conjunctiva/sclera: Conjunctivae normal.     Pupils: Pupils are equal, round, and reactive to light.  Neck:     Musculoskeletal: Normal range of motion and neck supple.  Cardiovascular:     Rate and Rhythm: Tachycardia present. Rhythm irregular.     Pulses: Normal pulses.     Heart sounds: Normal heart sounds.  Pulmonary:     Effort: Pulmonary effort is normal.     Breath sounds: Normal breath sounds.  Abdominal:     General: Abdomen is flat. Bowel sounds are normal.     Palpations: Abdomen is soft.  Musculoskeletal: Normal range of motion.  Skin:    General: Skin is warm and dry.     Capillary Refill: Capillary refill takes less than 2 seconds.  Neurological:     General: No focal deficit present.     Mental Status: She is alert and oriented to person, place, and time.  Psychiatric:        Mood and Affect: Mood normal.        Behavior: Behavior normal.      ED Treatments / Results  Labs (all labs ordered are listed, but only  abnormal results are displayed) Labs Reviewed  BASIC METABOLIC PANEL - Abnormal; Notable for the following components:      Result Value   Glucose, Bld 231 (*)    Creatinine, Ser 1.02 (*)    Calcium 8.4 (*)    GFR calc non Af Amer 54 (*)    All other components within normal limits  CBC - Abnormal; Notable for the following components:   RBC 3.21 (*)    Hemoglobin 11.4 (*)    HCT 34.7 (*)    MCV 108.1 (*)    MCH 35.5 (*)    nRBC 0.4 (*)    All other components within normal limits  MAGNESIUM - Abnormal; Notable for the following components:   Magnesium 0.9 (*)    All other components within normal limits  PROTIME-INR - Abnormal; Notable for the following components:   Prothrombin Time 23.1 (*)    All other components within normal limits  TSH    EKG EKG  Interpretation  Date/Time:  Wednesday June 06 2018 15:19:30 EST Ventricular Rate:  187 PR Interval:    QRS Duration: 74 QT Interval:  248 QTC Calculation: 437 R Axis:   86 Text Interpretation:  Atrial fibrillation with rapid ventricular response Cannot rule out Anterior infarct , age undetermined Abnormal ECG (hx of afib) Confirmed by Isla Pence [phone]) on 06/06/2018 3:43:18 PM   Radiology Dg Chest 2 View  Result Date: 06/06/2018 CLINICAL DATA:  Shortness of breath 42.4 weeks. Worsening shortness of breath after recent prednisone therapy. EXAM: CHEST - 2 VIEW COMPARISON:  Radiographs 01/09/2018. Cardiac CT 04/04/2018. PET-CT 02/05/2018. FINDINGS: Stable cardiomegaly. There is chronic left pleural thickening and left basilar opacity (hypermetabolic on prior PET-CT and consistent with metastatic disease). The lungs are otherwise clear. There is no edema, pleural effusion or pneumothorax. Postsurgical changes are present in both breasts and axilla. No acute osseous findings are evident. Multiple telemetry leads overlie the chest. IMPRESSION: No acute findings or obvious progression in known thoracic metastatic disease. Electronically Signed   By: Richardean Sale M.D.   On: 06/06/2018 16:35    Procedures .Sedation Date/Time: 06/06/2018 6:03 PM Performed by: Isla Pence, MD Authorized by: Isla Pence, MD   Consent:    Consent obtained:  Verbal   Consent given by:  Patient   Risks discussed:  Allergic reaction, dysrhythmia, inadequate sedation, nausea, prolonged hypoxia resulting in organ damage, prolonged sedation necessitating reversal, respiratory compromise necessitating ventilatory assistance and intubation and vomiting   Alternatives discussed:  Analgesia without sedation, anxiolysis and regional anesthesia Universal protocol:    Procedure explained and questions answered to patient or proxy's satisfaction: yes     Relevant documents present and verified: yes     Test  results available and properly labeled: yes     Imaging studies available: yes     Required blood products, implants, devices, and special equipment available: yes     Site/side marked: yes     Immediately prior to procedure a time out was called: yes     Patient identity confirmation method:  Verbally with patient Indications:    Procedure performed:  Cardioversion   Procedure necessitating sedation performed by:  Physician performing sedation Pre-sedation assessment:    Time since last food or drink:  5   ASA classification: class 2 - patient with mild systemic disease     Neck mobility: normal     Mouth opening:  3 or more finger widths   Thyromental distance:  4 finger widths  Mallampati score:  I - soft palate, uvula, fauces, pillars visible   Pre-sedation assessments completed and reviewed: airway patency, cardiovascular function, hydration status, mental status, nausea/vomiting, pain level, respiratory function and temperature   Immediate pre-procedure details:    Reassessment: Patient reassessed immediately prior to procedure     Reviewed: vital signs, relevant labs/tests and NPO status     Verified: bag valve mask available, emergency equipment available, intubation equipment available, IV patency confirmed, oxygen available and suction available   Procedure details (see MAR for exact dosages):    Preoxygenation:  Nasal cannula   Sedation:  Propofol   Intra-procedure monitoring:  Blood pressure monitoring, cardiac monitor, continuous pulse oximetry, frequent LOC assessments, frequent vital sign checks and continuous capnometry   Intra-procedure events: hypoxia     Intra-procedure management:  Airway repositioning   Total Provider sedation time (minutes):  30 Post-procedure details:    Attendance: Constant attendance by certified staff until patient recovered     Recovery: Patient returned to pre-procedure baseline     Post-sedation assessments completed and reviewed: airway  patency, cardiovascular function, hydration status, mental status, nausea/vomiting, pain level, respiratory function and temperature     Patient is stable for discharge or admission: yes     Patient tolerance:  Tolerated well, no immediate complications Comments:     Pt's oxygen saturation dropped briefly.  It came back up with stimulation of patient. .Cardioversion Date/Time: 06/06/2018 6:08 PM Performed by: Isla Pence, MD Authorized by: Isla Pence, MD   Consent:    Consent obtained:  Written   Consent given by:  Patient   Risks discussed:  Pain, cutaneous burn, death and induced arrhythmia   Alternatives discussed:  No treatment Pre-procedure details:    Cardioversion basis:  Emergent   Rhythm:  Atrial fibrillation   Electrode placement:  Anterior-posterior Patient sedated: Yes. Refer to sedation procedure documentation for details of sedation.  Attempt one:    Cardioversion mode:  Synchronous   Waveform:  Biphasic   Shock (Joules):  150   Shock outcome:  Conversion to normal sinus rhythm Post-procedure details:    Patient status:  Alert   Patient tolerance of procedure:  Tolerated well, no immediate complications   (including critical care time)  Medications Ordered in ED Medications  diltiazem (CARDIZEM) 1 mg/mL load via infusion 20 mg (20 mg Intravenous Bolus from Bag 06/06/18 1639)    And  diltiazem (CARDIZEM) 100 mg in dextrose 5% 12m (1 mg/mL) infusion (0 mg/hr Intravenous Paused 06/06/18 1738)  magnesium sulfate IVPB 2 g 50 mL (has no administration in time range)  metoprolol tartrate (LOPRESSOR) 5 MG/5ML injection (has no administration in time range)  sodium chloride flush (NS) 0.9 % injection 3 mL (3 mLs Intravenous Given 06/06/18 1650)  propofol (DIPRIVAN) 10 mg/mL bolus/IV push 47 mg (47 mg Intravenous Given 06/06/18 1738)  metoprolol tartrate (LOPRESSOR) injection 5 mg (5 mg Intravenous Given 06/06/18 1745)     Initial Impression / Assessment and Plan  / ED Course  I have reviewed the triage vital signs and the nursing notes.  Pertinent labs & imaging results that were available during my care of the patient were reviewed by me and considered in my medical decision making (see chart for details).  Pt has been compliant with her Eliquis and meets criteria for cardioversion.  Pt d/w Dr. JMartinique(cards) who agrees.  Pt's low magnesium was replaced with 2g mgso4.  She was d/c home with Mgox.    HR is in  the 80s with NSR and bp is good.  Pt is stable for d/c.  Return if worse.  F/u with cards.  CRITICAL CARE Performed by: Isla Pence   Total critical care time: 30 minutes  Critical care time was exclusive of separately billable procedures and treating other patients.  Critical care was necessary to treat or prevent imminent or life-threatening deterioration.  Critical care was time spent personally by me on the following activities: development of treatment plan with patient and/or surrogate as well as nursing, discussions with consultants, evaluation of patient's response to treatment, examination of patient, obtaining history from patient or surrogate, ordering and performing treatments and interventions, ordering and review of laboratory studies, ordering and review of radiographic studies, pulse oximetry and re-evaluation of patient's condition.    Final Clinical Impressions(s) / ED Diagnoses   Final diagnoses:  Atrial fibrillation with RVR [facility])  Hypomagnesemia    ED Discharge Orders    None       Isla Pence, MD 06/06/18 1940

## 2018-06-11 ENCOUNTER — Encounter (HOSPITAL_COMMUNITY): Payer: Self-pay | Admitting: Nurse Practitioner

## 2018-06-11 ENCOUNTER — Inpatient Hospital Stay (HOSPITAL_COMMUNITY)
Admission: AD | Admit: 2018-06-11 | Discharge: 2018-06-13 | DRG: 309 | Disposition: A | Discharge status: Home / Self Care | Payer: Medicare Other | Source: Ambulatory Visit | Attending: Cardiology | Admitting: Cardiology

## 2018-06-11 ENCOUNTER — Ambulatory Visit (HOSPITAL_BASED_OUTPATIENT_CLINIC_OR_DEPARTMENT_OTHER)
Admission: RE | Admit: 2018-06-11 | Discharge: 2018-06-11 | Disposition: A | Discharge status: Home / Self Care | Payer: Medicare Other | Source: Ambulatory Visit | Attending: Nurse Practitioner | Admitting: Nurse Practitioner

## 2018-06-11 ENCOUNTER — Other Ambulatory Visit: Payer: Self-pay

## 2018-06-11 VITALS — BP 102/64 | HR 172 | Ht 63.0 in | Wt 209.0 lb

## 2018-06-11 DIAGNOSIS — Z803 Family history of malignant neoplasm of breast: Secondary | ICD-10-CM | POA: Diagnosis not present

## 2018-06-11 DIAGNOSIS — Z823 Family history of stroke: Secondary | ICD-10-CM

## 2018-06-11 DIAGNOSIS — I4892 Unspecified atrial flutter: Secondary | ICD-10-CM | POA: Diagnosis present

## 2018-06-11 DIAGNOSIS — Z888 Allergy status to other drugs, medicaments and biological substances status: Secondary | ICD-10-CM | POA: Diagnosis not present

## 2018-06-11 DIAGNOSIS — E785 Hyperlipidemia, unspecified: Secondary | ICD-10-CM

## 2018-06-11 DIAGNOSIS — Z853 Personal history of malignant neoplasm of breast: Secondary | ICD-10-CM | POA: Diagnosis not present

## 2018-06-11 DIAGNOSIS — Z800 Family history of malignant neoplasm of digestive organs: Secondary | ICD-10-CM | POA: Insufficient documentation

## 2018-06-11 DIAGNOSIS — Z808 Family history of malignant neoplasm of other organs or systems: Secondary | ICD-10-CM

## 2018-06-11 DIAGNOSIS — C7800 Secondary malignant neoplasm of unspecified lung: Secondary | ICD-10-CM | POA: Diagnosis present

## 2018-06-11 DIAGNOSIS — Z8543 Personal history of malignant neoplasm of ovary: Secondary | ICD-10-CM

## 2018-06-11 DIAGNOSIS — Z7984 Long term (current) use of oral hypoglycemic drugs: Secondary | ICD-10-CM

## 2018-06-11 DIAGNOSIS — Z85820 Personal history of malignant melanoma of skin: Secondary | ICD-10-CM | POA: Diagnosis not present

## 2018-06-11 DIAGNOSIS — Z7901 Long term (current) use of anticoagulants: Secondary | ICD-10-CM

## 2018-06-11 DIAGNOSIS — I4819 Other persistent atrial fibrillation: Secondary | ICD-10-CM

## 2018-06-11 DIAGNOSIS — E119 Type 2 diabetes mellitus without complications: Secondary | ICD-10-CM | POA: Insufficient documentation

## 2018-06-11 DIAGNOSIS — Z8542 Personal history of malignant neoplasm of other parts of uterus: Secondary | ICD-10-CM | POA: Diagnosis not present

## 2018-06-11 DIAGNOSIS — M1990 Unspecified osteoarthritis, unspecified site: Secondary | ICD-10-CM | POA: Diagnosis present

## 2018-06-11 DIAGNOSIS — I4891 Unspecified atrial fibrillation: Secondary | ICD-10-CM

## 2018-06-11 DIAGNOSIS — Z9010 Acquired absence of unspecified breast and nipple: Secondary | ICD-10-CM

## 2018-06-11 DIAGNOSIS — Z8249 Family history of ischemic heart disease and other diseases of the circulatory system: Secondary | ICD-10-CM

## 2018-06-11 DIAGNOSIS — Z882 Allergy status to sulfonamides status: Secondary | ICD-10-CM

## 2018-06-11 DIAGNOSIS — Z79899 Other long term (current) drug therapy: Secondary | ICD-10-CM

## 2018-06-11 DIAGNOSIS — C787 Secondary malignant neoplasm of liver and intrahepatic bile duct: Secondary | ICD-10-CM

## 2018-06-11 DIAGNOSIS — C7960 Secondary malignant neoplasm of unspecified ovary: Secondary | ICD-10-CM | POA: Insufficient documentation

## 2018-06-11 DIAGNOSIS — Z90710 Acquired absence of both cervix and uterus: Secondary | ICD-10-CM

## 2018-06-11 DIAGNOSIS — C50919 Malignant neoplasm of unspecified site of unspecified female breast: Secondary | ICD-10-CM | POA: Diagnosis present

## 2018-06-11 DIAGNOSIS — C7951 Secondary malignant neoplasm of bone: Secondary | ICD-10-CM | POA: Diagnosis present

## 2018-06-11 DIAGNOSIS — Z923 Personal history of irradiation: Secondary | ICD-10-CM | POA: Diagnosis not present

## 2018-06-11 LAB — CBC WITH DIFFERENTIAL/PLATELET
Abs Immature Granulocytes: 0 10*3/uL (ref 0.00–0.07)
Basophils Absolute: 0 10*3/uL (ref 0.0–0.1)
Basophils Relative: 1 %
Eosinophils Absolute: 0 10*3/uL (ref 0.0–0.5)
Eosinophils Relative: 1 %
HCT: 32.1 % — ABNORMAL LOW (ref 36.0–46.0)
Hemoglobin: 10.2 g/dL — ABNORMAL LOW (ref 12.0–15.0)
Lymphocytes Relative: 20 %
Lymphs Abs: 0.7 10*3/uL (ref 0.7–4.0)
MCH: 34.2 pg — ABNORMAL HIGH (ref 26.0–34.0)
MCHC: 31.8 g/dL (ref 30.0–36.0)
MCV: 107.7 fL — ABNORMAL HIGH (ref 80.0–100.0)
Monocytes Absolute: 0.1 10*3/uL (ref 0.1–1.0)
Monocytes Relative: 4 %
Neutro Abs: 2.6 10*3/uL (ref 1.7–7.7)
Neutrophils Relative %: 74 %
Platelets: 245 10*3/uL (ref 150–400)
RBC: 2.98 MIL/uL — ABNORMAL LOW (ref 3.87–5.11)
RDW: 15.4 % (ref 11.5–15.5)
WBC: 3.5 10*3/uL — ABNORMAL LOW (ref 4.0–10.5)
nRBC: 0 % (ref 0.0–0.2)
nRBC: 1 /100 WBC — ABNORMAL HIGH

## 2018-06-11 LAB — HEPATIC FUNCTION PANEL
ALT: 16 U/L (ref 0–44)
AST: 23 U/L (ref 15–41)
Albumin: 2.4 g/dL — ABNORMAL LOW (ref 3.5–5.0)
Alkaline Phosphatase: 52 U/L (ref 38–126)
Bilirubin, Direct: 0.2 mg/dL (ref 0.0–0.2)
Indirect Bilirubin: 0.7 mg/dL (ref 0.3–0.9)
Total Bilirubin: 0.9 mg/dL (ref 0.3–1.2)
Total Protein: 5.9 g/dL — ABNORMAL LOW (ref 6.5–8.1)

## 2018-06-11 LAB — MAGNESIUM: Magnesium: 1.3 mg/dL — ABNORMAL LOW (ref 1.7–2.4)

## 2018-06-11 LAB — BASIC METABOLIC PANEL
Anion gap: 11 (ref 5–15)
BUN: 12 mg/dL (ref 8–23)
CO2: 27 mmol/L (ref 22–32)
Calcium: 8.1 mg/dL — ABNORMAL LOW (ref 8.9–10.3)
Chloride: 102 mmol/L (ref 98–111)
Creatinine, Ser: 0.95 mg/dL (ref 0.44–1.00)
GFR calc Af Amer: >60 mL/min (ref >60)
GFR calc non Af Amer: 59 mL/min — ABNORMAL LOW (ref >60)
Glucose, Bld: 254 mg/dL — ABNORMAL HIGH (ref 70–99)
Potassium: 4.7 mmol/L (ref 3.5–5.1)
Sodium: 140 mmol/L (ref 135–145)

## 2018-06-11 LAB — TSH: TSH: 4.743 u[IU]/mL — ABNORMAL HIGH (ref 0.350–4.500)

## 2018-06-11 LAB — T4, FREE: Free T4: 1.03 ng/dL (ref 0.82–1.77)

## 2018-06-11 MED ORDER — AMIODARONE HCL IN DEXTROSE 360-4.14 MG/200ML-% IV SOLN
60.0000 mg/h | INTRAVENOUS | Status: AC
  Administered 2018-06-12: 60 mg/h via INTRAVENOUS
  Filled 2018-06-11: qty 200

## 2018-06-11 MED ORDER — CALCIUM CARBONATE-VITAMIN D 500-200 MG-UNIT PO TABS
1.0000 | ORAL_TABLET | Freq: Every day | ORAL | Status: DC
  Administered 2018-06-12 – 2018-06-13 (×2): 1 via ORAL
  Filled 2018-06-11 (×3): qty 1

## 2018-06-11 MED ORDER — AMIODARONE LOAD VIA INFUSION
150.0000 mg | Freq: Once | INTRAVENOUS | Status: AC
  Administered 2018-06-12: 150 mg via INTRAVENOUS
  Filled 2018-06-11: qty 83.34

## 2018-06-11 MED ORDER — OMEGA-3-ACID ETHYL ESTERS 1 G PO CAPS
1.0000 g | ORAL_CAPSULE | Freq: Two times a day (BID) | ORAL | Status: DC
  Administered 2018-06-12 – 2018-06-13 (×3): 1 g via ORAL
  Filled 2018-06-11 (×4): qty 1

## 2018-06-11 MED ORDER — VITAMIN D 25 MCG (1000 UNIT) PO TABS
2000.0000 [IU] | ORAL_TABLET | Freq: Every day | ORAL | Status: DC
  Administered 2018-06-12 – 2018-06-13 (×2): 2000 [IU] via ORAL
  Filled 2018-06-11 (×3): qty 2

## 2018-06-11 MED ORDER — ONDANSETRON HCL 4 MG/2ML IJ SOLN: 4.0000 mg | Freq: Four times a day (QID) | INTRAMUSCULAR | Status: DC | PRN

## 2018-06-11 MED ORDER — MAGNESIUM OXIDE 400 (241.3 MG) MG PO TABS
200.0000 mg | ORAL_TABLET | Freq: Every day | ORAL | Status: DC
  Administered 2018-06-12 – 2018-06-13 (×2): 200 mg via ORAL
  Filled 2018-06-11 (×3): qty 1

## 2018-06-11 MED ORDER — GABAPENTIN 300 MG PO CAPS
300.0000 mg | ORAL_CAPSULE | Freq: Two times a day (BID) | ORAL | Status: DC
  Administered 2018-06-12 – 2018-06-13 (×3): 300 mg via ORAL
  Filled 2018-06-11 (×4): qty 1

## 2018-06-11 MED ORDER — LETROZOLE 2.5 MG PO TABS
2.5000 mg | ORAL_TABLET | Freq: Every day | ORAL | Status: DC
  Administered 2018-06-12 – 2018-06-13 (×2): 2.5 mg via ORAL
  Filled 2018-06-11 (×3): qty 1

## 2018-06-11 MED ORDER — APIXABAN 5 MG PO TABS
5.0000 mg | ORAL_TABLET | Freq: Two times a day (BID) | ORAL | Status: DC
  Administered 2018-06-11 – 2018-06-12 (×3): 5 mg via ORAL
  Filled 2018-06-11 (×5): qty 1

## 2018-06-11 MED ORDER — PALBOCICLIB 125 MG PO CAPS
125.0000 mg | ORAL_CAPSULE | Freq: Every day | ORAL | Status: DC
  Administered 2018-06-12: 125 mg via ORAL
  Filled 2018-06-11: qty 1

## 2018-06-11 MED ORDER — ACETAMINOPHEN 325 MG PO TABS: 650.0000 mg | ORAL_TABLET | ORAL | Status: DC | PRN

## 2018-06-11 MED ORDER — AMIODARONE HCL IN DEXTROSE 360-4.14 MG/200ML-% IV SOLN
30.0000 mg/h | INTRAVENOUS | Status: DC
  Administered 2018-06-12 – 2018-06-13 (×3): 30 mg/h via INTRAVENOUS
  Filled 2018-06-11 (×3): qty 200

## 2018-06-11 MED ORDER — FERROUS SULFATE 325 (65 FE) MG PO TABS
325.0000 mg | ORAL_TABLET | Freq: Every day | ORAL | Status: DC
  Filled 2018-06-11: qty 1

## 2018-06-11 MED ORDER — SIMVASTATIN 20 MG PO TABS
40.0000 mg | ORAL_TABLET | Freq: Every day | ORAL | Status: DC
  Administered 2018-06-11 – 2018-06-12 (×2): 40 mg via ORAL
  Filled 2018-06-11 (×2): qty 2

## 2018-06-11 NOTE — Progress Notes (Signed)
Primary Care Physician: [name], MD Referring Physician: [facility] f/u   Brittany Porter is a 73 y.o. female with a h/o Breast CA with recent mets to the [facility] found 03/31/16, on Faslodex and Ibrance, DM, that was in the ER, 01/09/18, for new onset of atrial flutter with RVR. V rate was around 170 bpm. The pt did not feel the episode but it was picked up on routine check of V/S. She  was not started on anticoagulation for concerns of her therapy for h/o breast cancer. CHA2DS2VASc score was 3. She was found to be in typical atrial flutter in the ER. When I initially  saw her 01/16/18,  she was in rhythm. . No tobacco, alcohol, snoring. Is sedentary and obese. She was  started on eliquis.  She saw Dr. Rayann Heman in f/u but was not interested in a flutter ablation. She returned to the ER last Wednesday, 06/06/18 and had a cardioversion for afib with RVR. She is now in the afib clinic, 06/11/18  and is in afib with RVR around 170 bpm. BP  soft at 614 asystolic. She is tolerating well but discussed with  Dr. Caryl Comes, that saw her in the clinic as well today , and discussed that  hospitalization would be probably be best way to further treat arrhythmia. She is in agreement.  Today, she denies symptoms of palpitations, chest pain, shortness of breath, orthopnea, PND, lower extremity edema, dizziness, presyncope, syncope, or neurologic sequela. The patient is tolerating medications without difficulties and is otherwise without complaint today.   Past Medical History:  Diagnosis Date  . Arthritis   . Breast cancer ([place])   . Diabetes mellitus   . Endometrial carcinoma ([place]) 10/2008  . Family history of breast cancer   . Family history of colon cancer   . Family history of skin cancer   . Fluid overload   . History of radiation therapy 04/13/2009, 04/16/2009, 04/27/2009, 05/07/2009, 05/18/2009   3000 cGy to proximal vagina  . Hyperlipidemia   . Iron deficiency   . Melanoma ([place]) 2010   rt arm and back  .  Ovarian cancer ([place])   . Pleural effusion 04/2010   Past Surgical History:  Procedure Laterality Date  . ABDOMINAL HYSTERECTOMY  10/2008  . INSERTION OF MESH  05/28/2012   Procedure: INSERTION OF MESH;  Surgeon: Adin Hector, MD;  Location: [facility];  Service: General;  Laterality: N/A;  . KNEE ARTHROSCOPY  04/2010   Right knee  . LAPAROSCOPIC LYSIS OF ADHESIONS  05/28/2012   Procedure: LAPAROSCOPIC LYSIS OF ADHESIONS;  Surgeon: Adin Hector, MD;  Location: [facility];  Service: General;  Laterality: N/A;  . MASTECTOMY  1997   Bilateral with lymph nodes  . Melanoma removal  02/2009, 07/2009  . VENTRAL HERNIA REPAIR  05/28/2012   Procedure: LAPAROSCOPIC VENTRAL HERNIA;  Surgeon: Adin Hector, MD;  Location: [facility];  Service: General;  Laterality: N/A;    Current Outpatient Medications  Medication Sig Dispense Refill  . apixaban (ELIQUIS) 5 MG TABS tablet Take 5 mg by mouth 2 (two) times daily.    . Calcium Carb-Cholecalciferol (CALCIUM 600+D3) 600-800 MG-UNIT TABS Take 1 tablet by mouth daily.     . Cholecalciferol (VITAMIN D3) 2000 UNITS TABS Take 2,000 mg by mouth daily.    . ferrous sulfate 325 (65 FE) MG tablet Take 325 mg by mouth daily with breakfast.    . fish oil-omega-3 fatty acids 1000 MG capsule Take 1  g by mouth 2 (two) times daily.     . furosemide (LASIX) 20 MG tablet Take 20 mg by mouth daily as needed for fluid or edema. Reported on 05/07/2015    . gabapentin (NEURONTIN) 300 MG capsule Take 1 capsule (300 mg total) by mouth 2 (two) times daily.    Marland Kitchen glimepiride (AMARYL) 2 MG tablet Take 2 mg by mouth daily with breakfast.    . IBRANCE 125 MG capsule TAKE 1 CAPSULE (125 MG TOTAL) BY MOUTH DAILY WITH BREAKFAST. TAKE FOR 21 DAYS ON, 7 DAYS OFF. TAKE WHOLE WITH FOOD. (Patient taking differently: Take 125 mg by mouth daily with breakfast. Take for 21 days on, 7 days off. Take whole with food.) 21 capsule 2  . ibuprofen (ADVIL,MOTRIN) 200 MG tablet Take 400 mg by mouth every 6  (six) hours as needed for headache (pain). Reported on 11/12/2015    . letrozole (FEMARA) 2.5 MG tablet Take 1 tablet (2.5 mg total) by mouth daily. 30 tablet 0  . Magnesium Oxide (MAG-OXIDE) 200 MG TABS Take 1 tablet (200 mg total) by mouth daily. 30 tablet 0  . metFORMIN (GLUCOPHAGE) 500 MG tablet Take 500 mg by mouth 2 (two) times daily with a meal.      . metoprolol succinate (TOPROL XL) 25 MG 24 hr tablet Take 1 tablet (25 mg total) by mouth daily. 30 tablet 1  . simvastatin (ZOCOR) 40 MG tablet Take 40 mg by mouth at bedtime.      . rivaroxaban (XARELTO) 20 MG TABS tablet Take 1 tablet (20 mg total) by mouth daily with supper. (Patient not taking: Reported on 06/06/2018) 30 tablet 11   No current facility-administered medications for this encounter.     Allergies  Allergen Reactions  . Prednisone Anaphylaxis  . Prednisolone     SOB, increased BP  . Sulfa Antibiotics Swelling    "Eyes swelled shut" - reaction to eye drops    Social History   Socioeconomic History  . Marital status: Married    Spouse name: Not on file  . Number of children: Not on file  . Years of education: Not on file  . Highest education level: Not on file  Occupational History  . Not on file  Social Needs  . Financial resource strain: Not on file  . Food insecurity:    Worry: Not on file    Inability: Not on file  . Transportation needs:    Medical: Not on file    Non-medical: Not on file  Tobacco Use  . Smoking status: Never Smoker  . Smokeless tobacco: Never Used  Substance and Sexual Activity  . Alcohol use: No  . Drug use: No  . Sexual activity: Yes  Lifestyle  . Physical activity:    Days per week: Not on file    Minutes per session: Not on file  . Stress: Not on file  Relationships  . Social connections:    Talks on phone: Not on file    Gets together: Not on file    Attends religious service: Not on file    Active member of club or organization: Not on file    Attends meetings of  clubs or organizations: Not on file    Relationship status: Not on file  . Intimate partner violence:    Fear of current or ex partner: Not on file    Emotionally abused: Not on file    Physically abused: Not on file    Forced  sexual activity: Not on file  Other Topics Concern  . Not on file  Social History Narrative  . Not on file    Family History  Problem Relation Age of Onset  . Stroke Mother   . Cancer Father        'voice box'  . Colon cancer Sister        dx >50  . Heart attack Brother   . Skin cancer Brother 19  . Cancer Sister        primary site unk, dx >50  . Stroke Brother   . Stroke Sister   . Colon cancer Sister 10  . Dementia Sister   . Breast cancer Sister        dx >5-, met to brain    ROS- All systems are reviewed and negative except as per the HPI above  Physical Exam: Vitals:   06/11/18 0904  BP: 102/64  Pulse: (!) 172  Weight: 94.8 kg  Height: '5\' 3"'  (1.6 m)   Wt Readings from Last 3 Encounters:  06/11/18 94.8 kg  06/06/18 93.9 kg  06/01/18 95.5 kg    Labs: Lab Results  Component Value Date   NA 139 06/06/2018   K 4.7 06/06/2018   CL 102 06/06/2018   CO2 23 06/06/2018   GLUCOSE 231 (H) 06/06/2018   BUN 10 06/06/2018   CREATININE 1.02 (H) 06/06/2018   CALCIUM 8.4 (L) 06/06/2018   MG 0.9 (LL) 06/06/2018   Lab Results  Component Value Date   INR 2.08 06/06/2018   Lab Results  Component Value Date   CHOL 153 12/23/2008   HDL 60 12/23/2008   LDLCALC 75 12/23/2008   TRIG 92 12/23/2008     GEN- The patient is well appearing, alert and oriented x 3 today.   Head- normocephalic, atraumatic Eyes-  Sclera clear, conjunctiva pink Ears- hearing intact Oropharynx- clear Neck- supple, no JVP Lymph- no cervical lymphadenopathy Lungs- Clear to ausculation bilaterally, normal work of breathing Heart- Regular rate and rhythm, no murmurs, rubs or gallops, PMI not laterally displaced GI- soft, NT, ND, + BS Extremities- no clubbing,  cyanosis, or edema MS- no significant deformity or atrophy Skin- no rash or lesion Psych- euthymic mood, full affect Neuro- strength and sensation are intact  EKG-afib with RVR at 172 bpm, qrs int 70 ms, qtc 426 ms Epic records reviewed    Assessment and Plan: 1. H/o of typical a flutter and afib with RVR  Successful cardioversion for afib with RVR 1/22 but ERAF Reviewed  with Dr. Caryl Comes and he feels that admission with amiodarone may be the best approach She has not missed any of her Eliquis, CHA2DS2VASc score of 3,(  as she is  to start xarelto for cost reasons after finishing her current eliquis Continue metoprolol without change as BP is soft  Dr. Caryl Comes gave her the option to go to [facility],  or as she seems to be tolerating go home and wait until a bed is available later today  She chose  to go home and wait for admissions to call with bed assignment when ready, with instruction, if she feels worse come back to the [facility]. Brittany Porter, [facility] [address] [place], [place] [address] [phone]

## 2018-06-11 NOTE — Progress Notes (Signed)
Patient arrived to 3E09 as a direct admit from home/heart clinic. VSS. No complaints of pain or discomfort. AFib with RVR on tel. HR 135-170 (oncall cardiology notified).No skin breakdown. Patient oriented to room and call system.

## 2018-06-11 NOTE — H&P (Signed)
Cardiology Admission History and Physical:   Patient ID: Brittany Porter MRN: [id number]; DOB: [birth year]   Admission date: 06/11/2018  Primary Care Provider: [name], MD Primary Cardiologist: No primary care provider on file.  Primary Electrophysiologist:  Dr. Caryl Comes  Chief Complaint:  "im short winded"  Patient Profile:   Brittany Porter is a 73 y.o. female with metastatic breast ca (on faslodex and ibrance), DM2, systolic heart failure (EF 40-45%) and known atrial fibrillation / atrial flutter on Eliquis s/p DCCV 06/06/18 who is admitted from atrial fibrillation clinic where she was found to be in rapid atrial fibrillation to the 170s.   History of Present Illness:   Brittany Porter was first diagnosed with 2:1 atrial flutter in 01/2018 - she denied associated symptoms at the time. She converted to SR with diltiazem and was subsequently discharged. She did well from an arrhythmia standpoint until 06/06/18, when she presented to the ER in AF with RVR to the 180s. After discussion with cardiology, decision made to proceed with DCCV, which was successful in restoring SR. She was discharged on her home dose of metoprolol and followed up today in atrial fibrillation clinic, where she was again found to be in AF w/ RVR with SBPs around 100.   After discussion with the patient, decision made to directly admit for IV amiodarone to treat her rapid atrial arrhythmias.   At the time of my interview, patient comfortable appearing, in no distress with HR in 170s. ROS negative for palpitations, chest pain, shortness of breath, orthopnea, PND, lower extremity edema, dizziness, presyncope, or syncope. She simply notes she feels bit more "short winded" than usual, which she thinks began on Sunday.    Past Medical History:  Diagnosis Date  . Arthritis   . Breast cancer ([place])   . Diabetes mellitus   . Endometrial carcinoma ([place]) 10/2008  . Family history of breast cancer   . Family history of colon cancer     . Family history of skin cancer   . Fluid overload   . History of radiation therapy 04/13/2009, 04/16/2009, 04/27/2009, 05/07/2009, 05/18/2009   3000 cGy to proximal vagina  . Hyperlipidemia   . Iron deficiency   . Melanoma ([place]) 2010   rt arm and back  . Ovarian cancer ([place])   . Pleural effusion 04/2010    Past Surgical History:  Procedure Laterality Date  . ABDOMINAL HYSTERECTOMY  10/2008  . INSERTION OF MESH  05/28/2012   Procedure: INSERTION OF MESH;  Surgeon: Adin Hector, MD;  Location: [facility];  Service: General;  Laterality: N/A;  . KNEE ARTHROSCOPY  04/2010   Right knee  . LAPAROSCOPIC LYSIS OF ADHESIONS  05/28/2012   Procedure: LAPAROSCOPIC LYSIS OF ADHESIONS;  Surgeon: Adin Hector, MD;  Location: [facility];  Service: General;  Laterality: N/A;  . MASTECTOMY  1997   Bilateral with lymph nodes  . Melanoma removal  02/2009, 07/2009  . VENTRAL HERNIA REPAIR  05/28/2012   Procedure: LAPAROSCOPIC VENTRAL HERNIA;  Surgeon: Adin Hector, MD;  Location: [facility];  Service: General;  Laterality: N/A;     Medications Prior to Admission: Prior to Admission medications   Medication Sig Start Date End Date Taking? Authorizing Provider  apixaban (ELIQUIS) 5 MG TABS tablet Take 5 mg by mouth 2 (two) times daily.   Yes [provider]  Calcium Carb-Cholecalciferol (CALCIUM 600+D3) 600-800 MG-UNIT TABS Take 1 tablet by mouth daily.    Yes [provider]  Cholecalciferol (  VITAMIN D3) 2000 UNITS TABS Take 2,000 mg by mouth daily.   Yes [provider]  ferrous sulfate 325 (65 FE) MG tablet Take 325 mg by mouth daily as needed (energy).    Yes [provider]  fish oil-omega-3 fatty acids 1000 MG capsule Take 1 g by mouth 2 (two) times daily.    Yes [provider]  furosemide (LASIX) 20 MG tablet Take 20 mg by mouth daily as needed for fluid or edema. Reported on 05/07/2015 10/08/14  Yes [provider]  gabapentin (NEURONTIN) 300 MG  capsule Take 1 capsule (300 mg total) by mouth 2 (two) times daily. 04/10/18  Yes Nicholas Lose, MD  glimepiride (AMARYL) 2 MG tablet Take 2 mg by mouth daily with breakfast.   Yes [provider]  IBRANCE 125 MG capsule TAKE 1 CAPSULE (125 MG TOTAL) BY MOUTH DAILY WITH BREAKFAST. TAKE FOR 21 DAYS ON, 7 DAYS OFF. TAKE WHOLE WITH FOOD. Patient taking differently: Take 125 mg by mouth daily with breakfast. Take for 21 days on, 7 days off. Take whole with food. 03/28/18  Yes Nicholas Lose, MD  ibuprofen (ADVIL,MOTRIN) 200 MG tablet Take 400 mg by mouth every 6 (six) hours as needed for headache (pain). Reported on 11/12/2015   Yes [provider]  letrozole (FEMARA) 2.5 MG tablet Take 1 tablet (2.5 mg total) by mouth daily. 05/10/18  Yes Nicholas Lose, MD  Magnesium Oxide (MAG-OXIDE) 200 MG TABS Take 1 tablet (200 mg total) by mouth daily. Patient taking differently: Take 200 mg by mouth at bedtime.  06/06/18  Yes Isla Pence, MD  metFORMIN (GLUCOPHAGE) 500 MG tablet Take 500 mg by mouth 2 (two) times daily with a meal.     Yes [provider]  metoprolol succinate (TOPROL XL) 25 MG 24 hr tablet Take 1 tablet (25 mg total) by mouth daily. 04/27/18  Yes Elouise Munroe, MD  naproxen sodium (ALEVE) 220 MG tablet Take 220 mg by mouth daily as needed (pain).   Yes [provider]  simvastatin (ZOCOR) 40 MG tablet Take 40 mg by mouth at bedtime.     Yes [provider]     Allergies:    Allergies  Allergen Reactions  . Prednisolone Shortness Of Breath and Other (See Comments)     increased BP  . Prednisone Anaphylaxis  . Sulfa Antibiotics Swelling    "Eyes swelled shut" - reaction to eye drops    Social History:   Social History   Socioeconomic History  . Marital status: Married    Spouse name: Not on file  . Number of children: Not on file  . Years of education: Not on file  . Highest education level: Not on file  Occupational History  .  Not on file  Social Needs  . Financial resource strain: Not on file  . Food insecurity:    Worry: Not on file    Inability: Not on file  . Transportation needs:    Medical: Not on file    Non-medical: Not on file  Tobacco Use  . Smoking status: Never Smoker  . Smokeless tobacco: Never Used  Substance and Sexual Activity  . Alcohol use: No  . Drug use: No  . Sexual activity: Yes  Lifestyle  . Physical activity:    Days per week: Not on file    Minutes per session: Not on file  . Stress: Not on file  Relationships  . Social connections:  Talks on phone: Not on file    Gets together: Not on file    Attends religious service: Not on file    Active member of club or organization: Not on file    Attends meetings of clubs or organizations: Not on file    Relationship status: Not on file  . Intimate partner violence:    Fear of current or ex partner: Not on file    Emotionally abused: Not on file    Physically abused: Not on file    Forced sexual activity: Not on file  Other Topics Concern  . Not on file  Social History Narrative  . Not on file    Family History:  The patient's family history includes Breast cancer in her sister; Cancer in her father and sister; Colon cancer in her sister; Colon cancer (age of onset: 64) in her sister; Dementia in her sister; Heart attack in her brother; Skin cancer (age of onset: 68) in her brother; Stroke in her brother, mother, and sister.    Review of Systems: [y] = yes, [ ]  = no     General: Weight gain [ ] ; Weight loss [ ] ; Anorexia [ ] ; Fatigue [ ] ; Fever [ ] ; Chills [ ] ; Weakness [ ]    Cardiac: Chest pain/pressure [ ] ; Resting SOB [ ] ; Exertional SOB [ ] ; Orthopnea [ ] ; Pedal Edema [ ] ; Palpitations [ ] ; Syncope [ ] ; Presyncope [ ] ; Paroxysmal nocturnal dyspnea[ ]    Pulmonary: Cough [ ] ; Wheezing[ ] ; Hemoptysis[ ] ; Sputum [ ] ; Snoring [ ]    GI: Vomiting[ ] ; Dysphagia[ ] ; Melena[ ] ; Hematochezia [ ] ; Heartburn[ ] ; Abdominal pain [  ]; Constipation [ ] ; Diarrhea [ ] ; BRBPR [ ]    GU: Hematuria[ ] ; Dysuria [ ] ; Nocturia[ ]    Vascular: Pain in legs with walking [ ] ; Pain in feet with lying flat [ ] ; Non-healing sores [ ] ; Stroke [ ] ; TIA [ ] ; Slurred speech [ ] ;   Neuro: Headaches[ ] ; Vertigo[ ] ; Seizures[ ] ; Paresthesias[ ] ;Blurred vision [ ] ; Diplopia [ ] ; Vision changes [ ]    Ortho/Skin: Arthritis [ ] ; Joint pain [ ] ; Muscle pain [ ] ; Joint swelling [ ] ; Back Pain [ ] ; Rash [ ]    Psych: Depression[ ] ; Anxiety[ ]    Heme: Bleeding problems [ ] ; Clotting disorders [ ] ; Anemia [ ]    Endocrine: Diabetes [ ] ; Thyroid dysfunction[ ]   Physical Exam/Data:   Vitals:   06/11/18 2042  BP: 104/90  Pulse: (!) 51  Resp: 18  Temp: 98.3 F (36.8 C)  TempSrc: Oral  SpO2: 99%   No intake or output data in the 24 hours ending 06/11/18 2248 There were no vitals filed for this visit. There is no height or weight on file to calculate BMI.  General:  Well nourished, well developed, in no acute distress. HEENT: normal Lymph: no adenopathy Neck: no JVD Endocrine:  No thryomegaly Vascular: No carotid bruits; FA pulses 2+ bilaterally without bruits. 2+ radial pulses.  Cardiac:  Irregularly, irregular. Tachycardic. No obvious murmurs.  Lungs:  clear to auscultation bilaterally, no wheezing, rhonchi or rales  Abd: soft, nontender, no hepatomegaly  Ext: no LE edema. WWP.  Musculoskeletal:  No deformities, BUE and BLE strength normal and equal Skin: warm and dry  Neuro:  CNs 2-12 intact, no focal abnormalities noted Psych:  Normal affect   EKG:  The ECG that was done  Relevant CV Studies: Nuclear Stress 02/2018:  The left ventricular ejection fraction  is mildly decreased (45-54%).  Nuclear stress EF is calculated at 45%. but visually appears at least 50-55%  There was no ST segment deviation noted during stress.  There is a large defect of severe severity present in the basal inferoseptal, basal inferior, basal  inferolateral, mid inferoseptal, mid inferior, mid inferolateral, apical septal, apical inferior and apex location. The defect is non-reversible and likely represents diaphragmatic attenuation artifact. No ischemia noted.  2D echo showed mild inferolateral hypokinesis but on gated nuclear images there does not appear to be any focal wall motion abnormality.  Consider coronary CTA for further evaulation.  TTE 01/2018: Study Conclusions Left ventricle: The cavity size was normal. Systolic function was   mildly reduced. The estimated ejection fraction was in the range   of 45% to 50%. Diffuse hypokinesis. There is hypokinesis of the   inferolateral myocardium. Features are consistent with a   pseudonormal left ventricular filling pattern, with concomitant   abnormal relaxation and increased filling pressure (grade 2   diastolic dysfunction). RV S Vel: 9.57cm/s. GLS: -13.6%  Laboratory Data:  Chemistry Recent Labs  Lab 06/06/18 1527  NA 139  K 4.7  CL 102  CO2 23  GLUCOSE 231*  BUN 10  CREATININE 1.02*  CALCIUM 8.4*  GFRNONAA 54*  GFRAA >60  ANIONGAP 14    No results for input(s): PROT, ALBUMIN, AST, ALT, ALKPHOS, BILITOT in the last 168 hours. Hematology Recent Labs  Lab 06/06/18 1527 06/11/18 2203  WBC 4.9 3.5*  RBC 3.21* 2.98*  HGB 11.4* 10.2*  HCT 34.7* 32.1*  MCV 108.1* 107.7*  MCH 35.5* 34.2*  MCHC 32.9 31.8  RDW 15.3 15.4  PLT 242 245   Cardiac EnzymesNo results for input(s): TROPONINI in the last 168 hours. No results for input(s): TROPIPOC in the last 168 hours.  BNPNo results for input(s): BNP, PROBNP in the last 168 hours.  DDimer No results for input(s): DDIMER in the last 168 hours.  Radiology/Studies:  No results found.  Assessment and Plan:   Brittany Porter is a 73 year old woman with metastatic breast ca being actively treated, DM2, and atrial fibrillation and flutter on eliquis who presented to clinic today found to be in rapid afib. She is admitted  for initiation of IV amiodarone and further treatment of her atrial arrhythmias.   #AF w/ RVR -- Patient compliant with all doses of eliquis; has been on [facility] for > 4 weeks -- Obtain baseline EKG -- Start amiodarone bolus + gtt -- Check baseline TSH, LFTs for amiodarone. Will also need baseline PFTs in v. Outpatient.  -- Hold BB while initiating amiodarone.  -- BID BMP will replete K > 4, Mg > 2 -- NPO after MN in case in need of repeat DCCV.   #DM2 -- Monitor FSG -- ISS PRN  #Metastatic Breast Ca -- Continue home immunologic agents.   Severity of Illness: The appropriate patient status for this patient is OBSERVATION. Observation status is judged to be reasonable and necessary in order to provide the required intensity of service to ensure the patient's safety. The patient's presenting symptoms, physical exam findings, and initial radiographic and laboratory data in the context of their medical condition is felt to place them at decreased risk for further clinical deterioration. Furthermore, it is anticipated that the patient will be medically stable for discharge from the hospital within 2 midnights of admission. The following factors support the patient status of observation.   " The patient's presenting symptoms include afib w/  RVR. " The physical exam findings include n/a " The initial radiographic and laboratory data are *n/a  For questions or updates, please contact [facility] Please consult [email or web address] for contact info under    Signed, Milus Banister, MD  06/11/2018 10:48 PM

## 2018-06-11 NOTE — Progress Notes (Signed)
  Amiodarone Drug - Drug Interaction Consult Note  Recommendations: Monitor electrolytes and s/s myopathy Amiodarone is metabolized by the cytochrome P450 system and therefore has the potential to cause many drug interactions. Amiodarone has an average plasma half-life of 50 days (range 20 to 100 days).   There is potential for drug interactions to occur several weeks or months after stopping treatment and the onset of drug interactions may be slow after initiating amiodarone.   [x]  Statins: Increased risk of myopathy. Simvastatin- restrict dose to 20mg  daily. Other statins: counsel patients to report any muscle pain or weakness immediately.  []  Anticoagulants: Amiodarone can increase anticoagulant effect. Consider warfarin dose reduction. Patients should be monitored closely and the dose of anticoagulant altered accordingly, remembering that amiodarone levels take several weeks to stabilize.  []  Antiepileptics: Amiodarone can increase plasma concentration of phenytoin, the dose should be reduced. Note that small changes in phenytoin dose can result in large changes in levels. Monitor patient and counsel on signs of toxicity.  []  Beta blockers: increased risk of bradycardia, AV block and myocardial depression. Sotalol - avoid concomitant use.  []   Calcium channel blockers (diltiazem and verapamil): increased risk of bradycardia, AV block and myocardial depression.  []   Cyclosporine: Amiodarone increases levels of cyclosporine. Reduced dose of cyclosporine is recommended.  []  Digoxin dose should be halved when amiodarone is started.  []  Diuretics: increased risk of cardiotoxicity if hypokalemia occurs.  []  Oral hypoglycemic agents (glyburide, glipizide, glimepiride): increased risk of hypoglycemia. Patient's glucose levels should be monitored closely when initiating amiodarone therapy.   []  Drugs that prolong the QT interval:  Torsades de pointes risk may be increased with concurrent use -  avoid if possible.  Monitor QTc, also keep magnesium/potassium WNL if concurrent therapy can't be avoided. Marland Kitchen Antibiotics: e.g. fluoroquinolones, erythromycin. . Antiarrhythmics: e.g. quinidine, procainamide, disopyramide, sotalol. . Antipsychotics: e.g. phenothiazines, haloperidol.  . Lithium, tricyclic antidepressants, and methadone. Thank You,  Excell Seltzer Poteet  06/11/2018 10:35 PM

## 2018-06-12 ENCOUNTER — Encounter (HOSPITAL_COMMUNITY): Payer: Self-pay | Admitting: Anesthesiology

## 2018-06-12 ENCOUNTER — Telehealth: Payer: Self-pay

## 2018-06-12 DIAGNOSIS — I4892 Unspecified atrial flutter: Secondary | ICD-10-CM

## 2018-06-12 DIAGNOSIS — I4891 Unspecified atrial fibrillation: Principal | ICD-10-CM

## 2018-06-12 LAB — BASIC METABOLIC PANEL
Anion gap: 11 (ref 5–15)
BUN: 10 mg/dL (ref 8–23)
CO2: 27 mmol/L (ref 22–32)
Calcium: 7.9 mg/dL — ABNORMAL LOW (ref 8.9–10.3)
Chloride: 102 mmol/L (ref 98–111)
Creatinine, Ser: 0.74 mg/dL (ref 0.44–1.00)
GFR calc Af Amer: >60 mL/min (ref >60)
GFR calc non Af Amer: >60 mL/min (ref >60)
Glucose, Bld: 159 mg/dL — ABNORMAL HIGH (ref 70–99)
Potassium: 4.4 mmol/L (ref 3.5–5.1)
Sodium: 140 mmol/L (ref 135–145)

## 2018-06-12 LAB — GLUCOSE, CAPILLARY
Glucose-Capillary: 127 mg/dL — ABNORMAL HIGH (ref 70–99)
Glucose-Capillary: 189 mg/dL — ABNORMAL HIGH (ref 70–99)
Glucose-Capillary: 162 mg/dL — ABNORMAL HIGH (ref 70–99)
Glucose-Capillary: 188 mg/dL — ABNORMAL HIGH (ref 70–99)
Glucose-Capillary: 140 mg/dL — ABNORMAL HIGH (ref 70–99)

## 2018-06-12 LAB — MAGNESIUM: Magnesium: 2.1 mg/dL (ref 1.7–2.4)

## 2018-06-12 MED ORDER — SODIUM CHLORIDE 0.9% FLUSH: 3.0000 mL | INTRAVENOUS | Status: DC | PRN

## 2018-06-12 MED ORDER — METOPROLOL SUCCINATE ER 25 MG PO TB24
25.0000 mg | ORAL_TABLET | Freq: Every day | ORAL | Status: DC
  Administered 2018-06-12 – 2018-06-13 (×2): 25 mg via ORAL
  Filled 2018-06-12 (×2): qty 1

## 2018-06-12 MED ORDER — SODIUM CHLORIDE 0.9 % IV SOLN: 250.0000 mL | INTRAVENOUS | Status: DC

## 2018-06-12 MED ORDER — SODIUM CHLORIDE 0.9% FLUSH
3.0000 mL | Freq: Two times a day (BID) | INTRAVENOUS | Status: DC
  Administered 2018-06-12 – 2018-06-13 (×2): 3 mL via INTRAVENOUS

## 2018-06-12 MED ORDER — MAGNESIUM SULFATE 2 GM/50ML IV SOLN
2.0000 g | Freq: Once | INTRAVENOUS | Status: AC
  Administered 2018-06-12: 2 g via INTRAVENOUS
  Filled 2018-06-12: qty 50

## 2018-06-12 NOTE — Progress Notes (Signed)
Jens Som PA paged and made aware for pts.  HR sustaining to 130'-140's. Pt. Asymptomatic and will have cardioversion tomorrow morning.

## 2018-06-12 NOTE — Progress Notes (Signed)
Inpatient Diabetes Program Recommendations  AACE/ADA: New Consensus Statement on Inpatient Glycemic Control   Target Ranges:  Prepandial:   less than 140 mg/dL      Peak postprandial:   less than 180 mg/dL (1-2 hours)      Critically ill patients:  140 - 180 mg/dL   Results for NERIDA, BOIVIN (MRN [id number]) as of 06/12/2018 12:02  Ref. Range 06/12/2018 02:58 06/12/2018 08:32 06/12/2018 11:35  Glucose-Capillary Latest Ref Range: 70 - 99 mg/dL 127 (H) 189 (H) 162 (H)   Review of Glycemic Control  Diabetes history: DM2 Outpatient Diabetes medications: Amaryl 2 mg QAM, Metformin 500 mg BID Current orders for Inpatient glycemic control: None  Inpatient Diabetes Program Recommendations:  Correction (SSI): While inpatient, please consider ordering CBGs with Novolog 0-9 units TID with meals and Novolog 0-5 units QHS.  Thanks, Barnie Alderman, RN, MSN, CDE Diabetes Coordinator Inpatient Diabetes Program [phone] (Team Pager from 8am to 5pm)

## 2018-06-12 NOTE — Telephone Encounter (Signed)
lmom for the AFIB clinic. Pt was scheduled Dr.Acharya on 06/25/18. appt cancelled. Per Dr.Acharya the pt does not need a f/u with general cardiology. Pt was last seen by her on 06/01/18 with a 6 mo f/u recommended. Dr.Acharaya feels that it would be more appropriate for the pt to f/u with EP after hospital d/c. Pt was seen in the AFIB clinic on 1/27, she was noted to be in AFIB with RVR. She is currently admitted with a plan to load Amiodarone and DCCV. Called and left a message at the AFIB clinic to alert them of Dr.Acharya's recommendation. Message left on their voicemail.

## 2018-06-12 NOTE — Consult Note (Addendum)
Cardiology Consultation:   Patient ID: Brittany Porter MRN: [id number]; DOB: October 22, 1945  Admit date: 06/11/2018 Date of Consult: 06/12/2018  Primary Care Provider: [name], MD Primary Cardiologist: Dr. Margaretann Loveless Primary Electrophysiologist:  Dr. Rayann Heman Oncology: Dr. Georgette Shell   Patient Profile:   Brittany Porter is a 73 y.o. female with a hx of DM, CM (?etiology, ischemic), arthritis , and a long hx with cancer.  b/l breast cancer s/p b/l mastectomies w/nodes years ago, developed lung and bone mets treated.  Eventually diagnosed with melanoma, had to lesions removed, developed uterine, endometrial and cervical caner requiring large surgical intervention including appendectomy for tumor removal.  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  who is being seen today for the evaluation of rapid AFib at the request of Dr. Marletta Lor.  History of Present Illness:   Brittany Porter was seen by Dr. Rayann Heman Aug 2019 at an ER visit with palpitations, fund in AFlutter rates 140's-170's, (better seen after adenosine did not convert though had spontaneous conversion with dilt).  Felt her AFlutter to be typical, not immediately started on [facility] with extensive cancer issues wanting clarification.  At her follow up with Dr. Rayann Heman in Sept, at that visit she was on Eliquis, ablation was discussed but the patient did not want to pursue procedure at that time.  Planned for f/u with Dr. Margaretann Loveless for CM and WMA on her echo.   She last saw Dr. Margaretann Loveless earlier this month, despite 2 attempts at CTa to evaluate for CAD, unable to see coronaries with poor opacification, planned to treat as presumed CAD (noted coronary calcification on a PET) with no anginal symptoms, not planned for cath.   The patient had a ER visit with incidentally noted rapid AFib 06/06/2018 DCCV in the ER  Follow up at the AFib clinic yesterday noted her in rapid AFlutter (largely asymptomatic) with BP  low 100's.  Dr. Caryl Comes saw the patient, offered ER vs direct admit for amiodarone, the patient came in last evening once bed became available, and was started on amiodarone gtt (her home toprol held)  LABS K+ 4.4 BUN/Creat 10/0.74 Mag 1.3 >> 2.1 WBC 3.5 H/H 10/32 plts 245  06/06/2018 PT 23.1 INR 2.08  She has been slightly SOB for a few days, maybe a little fatigued, no specific complaints of any kind of palpitations, no CP.    Past Medical History:  Diagnosis Date  . Arthritis   . Breast cancer ([place])   . Diabetes mellitus   . Endometrial carcinoma ([place]) 10/2008  . Family history of breast cancer   . Family history of colon cancer   . Family history of skin cancer   . Fluid overload   . History of radiation therapy 04/13/2009, 04/16/2009, 04/27/2009, 05/07/2009, 05/18/2009   3000 cGy to proximal vagina  . Hyperlipidemia   . Iron deficiency   . Melanoma ([place]) 2010   rt arm and back  . Ovarian cancer ([place])   . Pleural effusion 04/2010    Past Surgical History:  Procedure Laterality Date  . ABDOMINAL HYSTERECTOMY  10/2008  . INSERTION OF MESH  05/28/2012   Procedure: INSERTION OF MESH;  Surgeon: Adin Hector, MD;  Location: [facility];  Service: General;  Laterality: N/A;  . KNEE ARTHROSCOPY  04/2010   Right knee  . LAPAROSCOPIC LYSIS OF ADHESIONS  05/28/2012   Procedure: LAPAROSCOPIC LYSIS OF ADHESIONS;  Surgeon: Adin Hector, MD;  Location: [facility] OR;  Service: General;  Laterality: N/A;  . MASTECTOMY  1997   Bilateral with lymph nodes  . Melanoma removal  02/2009, 07/2009  . VENTRAL HERNIA REPAIR  05/28/2012   Procedure: LAPAROSCOPIC VENTRAL HERNIA;  Surgeon: Adin Hector, MD;  Location: [facility];  Service: General;  Laterality: N/A;     Home Medications:  Prior to Admission medications   Medication Sig Start Date End Date Taking? Authorizing Provider  apixaban (ELIQUIS) 5 MG TABS tablet Take 5 mg by mouth 2 (two) times daily.   Yes [provider]  Calcium  Carb-Cholecalciferol (CALCIUM 600+D3) 600-800 MG-UNIT TABS Take 1 tablet by mouth daily.    Yes [provider]  Cholecalciferol (VITAMIN D3) 2000 UNITS TABS Take 2,000 mg by mouth daily.   Yes [provider]  ferrous sulfate 325 (65 FE) MG tablet Take 325 mg by mouth daily as needed (energy).    Yes [provider]  fish oil-omega-3 fatty acids 1000 MG capsule Take 1 g by mouth 2 (two) times daily.    Yes [provider]  furosemide (LASIX) 20 MG tablet Take 20 mg by mouth daily as needed for fluid or edema. Reported on 05/07/2015 10/08/14  Yes [provider]  gabapentin (NEURONTIN) 300 MG capsule Take 1 capsule (300 mg total) by mouth 2 (two) times daily. 04/10/18  Yes Nicholas Lose, MD  glimepiride (AMARYL) 2 MG tablet Take 2 mg by mouth daily with breakfast.   Yes [provider]  IBRANCE 125 MG capsule TAKE 1 CAPSULE (125 MG TOTAL) BY MOUTH DAILY WITH BREAKFAST. TAKE FOR 21 DAYS ON, 7 DAYS OFF. TAKE WHOLE WITH FOOD. Patient taking differently: Take 125 mg by mouth daily with breakfast. Take for 21 days on, 7 days off. Take whole with food. 03/28/18  Yes Nicholas Lose, MD  ibuprofen (ADVIL,MOTRIN) 200 MG tablet Take 400 mg by mouth every 6 (six) hours as needed for headache (pain). Reported on 11/12/2015   Yes [provider]  letrozole (FEMARA) 2.5 MG tablet Take 1 tablet (2.5 mg total) by mouth daily. 05/10/18  Yes Nicholas Lose, MD  Magnesium Oxide (MAG-OXIDE) 200 MG TABS Take 1 tablet (200 mg total) by mouth daily. Patient taking differently: Take 200 mg by mouth at bedtime.  06/06/18  Yes Isla Pence, MD  metFORMIN (GLUCOPHAGE) 500 MG tablet Take 500 mg by mouth 2 (two) times daily with a meal.     Yes [provider]  metoprolol succinate (TOPROL XL) 25 MG 24 hr tablet Take 1 tablet (25 mg total) by mouth daily. 04/27/18  Yes Elouise Munroe, MD  naproxen sodium (ALEVE) 220 MG tablet Take 220 mg by mouth daily as  needed (pain).   Yes [provider]  simvastatin (ZOCOR) 40 MG tablet Take 40 mg by mouth at bedtime.     Yes [provider]    Inpatient Medications: Scheduled Meds: . apixaban  5 mg Oral BID  . calcium-vitamin D  1 tablet Oral Daily  . cholecalciferol  2,000 Units Oral Daily  . ferrous sulfate  325 mg Oral Q breakfast  . gabapentin  300 mg Oral BID  . letrozole  2.5 mg Oral Daily  . magnesium oxide  200 mg Oral Daily  . omega-3 acid ethyl esters  1 g Oral BID  . palbociclib  125 mg Oral Q breakfast  . simvastatin  40 mg Oral QHS   Continuous Infusions: . amiodarone 30 mg/hr (06/12/18 0603)  PRN Meds: acetaminophen, ondansetron (ZOFRAN) IV  Allergies:    Allergies  Allergen Reactions  . Prednisolone Shortness Of Breath and Other (See Comments)     increased BP  . Prednisone Anaphylaxis  . Sulfa Antibiotics Swelling    "Eyes swelled shut" - reaction to eye drops    Social History:   Social History   Socioeconomic History  . Marital status: Married    Spouse name: Not on file  . Number of children: Not on file  . Years of education: Not on file  . Highest education level: Not on file  Occupational History  . Not on file  Social Needs  . Financial resource strain: Not on file  . Food insecurity:    Worry: Not on file    Inability: Not on file  . Transportation needs:    Medical: Not on file    Non-medical: Not on file  Tobacco Use  . Smoking status: Never Smoker  . Smokeless tobacco: Never Used  Substance and Sexual Activity  . Alcohol use: No  . Drug use: No  . Sexual activity: Yes  Lifestyle  . Physical activity:    Days per week: Not on file    Minutes per session: Not on file  . Stress: Not on file  Relationships  . Social connections:    Talks on phone: Not on file    Gets together: Not on file    Attends religious service: Not on file    Active member of club or organization: Not on file    Attends meetings of clubs or  organizations: Not on file    Relationship status: Not on file  . Intimate partner violence:    Fear of current or ex partner: Not on file    Emotionally abused: Not on file    Physically abused: Not on file    Forced sexual activity: Not on file  Other Topics Concern  . Not on file  Social History Narrative  . Not on file    Family History:   Family History  Problem Relation Age of Onset  . Stroke Mother   . Cancer Father        'voice box'  . Colon cancer Sister        dx >50  . Heart attack Brother   . Skin cancer Brother 59  . Cancer Sister        primary site unk, dx >50  . Stroke Brother   . Stroke Sister   . Colon cancer Sister 61  . Dementia Sister   . Breast cancer Sister        dx >5-, met to brain     ROS:  Please see the history of present illness.  All other ROS reviewed and negative.     Physical Exam/Data:   Vitals:   06/12/18 0128 06/12/18 0300 06/12/18 0349 06/12/18 0838  BP: 116/90 (!) 119/51 104/80 113/82  Pulse: (!) 114 (!) 118 96 (!) 133  Resp: _0 Temp:   98.3 F (36.8 C) 98.4 F (36.9 C)  TempSrc:   Oral Oral  SpO2: 94% 98% 98% 97%  Weight:   92.2 kg   Height:   _1  (1.6 m)     Intake/Output Summary (Last 24 hours) at 06/12/2018 1122 Last data filed at 06/12/2018 0915 Gross per 24 hour  Intake 525.33 ml  Output 600 ml  Net -74.67 ml   Last 3 Weights 06/12/2018 06/11/2018  06/06/2018  Weight (lbs) 203 lb 3.2 oz 209 lb 207 lb  Weight (kg) 92.171 kg 94.802 kg 93.895 kg     Body mass index is 36 kg/m.  General:  Well nourished, well developed, in no acute distress HEENT: normal Lymph: no adenopathy Neck: no JVD Endocrine:  No thryomegaly Vascular: No carotid bruits Cardiac: irreg-irreg; no murmurs, gallops or rubs Lungs:  CTA b/l, no wheezing, rhonchi or rales  Abd: soft, nontender  Ext: no edema Musculoskeletal:  No deformities Skin: warm and dry  Neuro:  No gross focal abnormalities noted Psych:  Normal affect    EKG:  The EKG was personally reviewed and demonstrates:    #1 suspect AFlutter 172bpm #2 looks like coarse AFib/flutter 128bpm  06/06/2018: post DCCV: SR, PACs 86bpm, normal intervals  Telemetry:  Telemetry was personally reviewed and demonstrates:   AFib 120's-130's  Relevant CV Studies:  04/25/18: Coronary CTa IMPRESSION: 1. The patient's coronary artery calcium score is 272, which places the patient in the 79 percentile. This is greater than expected for age and gender matched peers. 2. Normal coronary origin with right dominance. 3. Nondiagnostic exam for the coronary arteries due to incomplete contrast opacification  01/26/18: TTE Study Conclusions - Left ventricle: The cavity size was normal. Systolic function was   mildly reduced. The estimated ejection fraction was in the range   of 45% to 50%. Diffuse hypokinesis. There is hypokinesis of the   inferolateral myocardium. Features are consistent with a   pseudonormal left ventricular filling pattern, with concomitant   abnormal relaxation and increased filling pressure (grade 2   diastolic dysfunction). RV S Vel: 9.57cm/s. GLS: -13.6%  Laboratory Data:  Chemistry Recent Labs  Lab 06/06/18 1527 06/11/18 2203 06/12/18 0412  NA 139 140 140  K 4.7 4.7 4.4  CL 102 102 102  CO2 _0 GLUCOSE 231* 254* 159*  BUN _1 CREATININE 1.02* 0.95 0.74  CALCIUM 8.4* 8.1* 7.9*  GFRNONAA 54* 59* >60  GFRAA >60 >60 >60  ANIONGAP _2 Recent Labs  Lab 06/11/18 2203  PROT 5.9*  ALBUMIN 2.4*  AST 23  ALT 16  ALKPHOS 52  BILITOT 0.9   Hematology Recent Labs  Lab 06/06/18 1527 06/11/18 2203  WBC 4.9 3.5*  RBC 3.21* 2.98*  HGB 11.4* 10.2*  HCT 34.7* 32.1*  MCV 108.1* 107.7*  MCH 35.5* 34.2*  MCHC 32.9 31.8  RDW 15.3 15.4  PLT 242 245   Cardiac EnzymesNo results for input(s): TROPONINI in the last 168 hours. No results for input(s): TROPIPOC in the last 168 hours.  BNPNo results for input(s):  BNP, PROBNP in the last 168 hours.  DDimer No results for input(s): DDIMER in the last 168 hours.  Radiology/Studies:  No results found.  Assessment and Plan:   1. Known h/o AFlutter, looks more AFib here today     CHA2DS2Vasc is at least 3, on Eliquis, appropriately dosed     No DCCV slots today (schedule for tomorrow)     Resume home Toprol  Notes report changing her eliquis to xarelto 2/2 diarrhea, she has not yet made that change and confirms she continues on Eliquis without missed dose in several weeks (more then 3 weeks that she can remember)    For questions or updates, please contact [place] Please consult [email or web address] for contact info under     Signed, Baldwin Jamaica, PA-C  06/12/2018 11:22 AM   I have  seen, examined the patient, and reviewed the above assessment and plan.  Changes to above are made where necessary.  On exam, iRRR.  Continue IV amiodarone as advised by Dr Caryl Comes.  Would anticipate cardioversion tomorrow.  Hopefully home tomorrow afternoon.  She reports compliance with anticoagulation, without interruption.  Co Sign: Thompson Grayer, MD 06/12/2018 3:23 PM

## 2018-06-12 NOTE — Anesthesia Preprocedure Evaluation (Deleted)
Anesthesia Evaluation    Reviewed: Allergy & Precautions, Patient's Chart, lab work & pertinent test results  History of Anesthesia Complications Negative for: history of anesthetic complications  Airway        Dental   Pulmonary neg pulmonary ROS,           Cardiovascular +CHF  + dysrhythmias Atrial Fibrillation      Neuro/Psych negative neurological ROS  negative psych ROS   GI/Hepatic negative GI ROS, Neg liver ROS,   Endo/Other  diabetes, Type 2  Renal/GU negative Renal ROS  negative genitourinary   Musculoskeletal negative musculoskeletal ROS (+)   Abdominal   Peds  Hematology negative hematology ROS (+)   Anesthesia Other Findings 73 yo F for cardioversion - A fib w/ RVR, CHF (EF 40-45%), DM2, metastatic breast cancer - MPS 03/06/18: no ischemia  Reproductive/Obstetrics                             Anesthesia Physical Anesthesia Plan  ASA: III  Anesthesia Plan: General   Post-op Pain Management:    Induction: Intravenous  PONV Risk Score and Plan: 3 and TIVA and Treatment may vary due to age or medical condition  Airway Management Planned: Mask  Additional Equipment: None  Intra-op Plan:   Post-operative Plan:   Informed Consent:   Plan Discussed with:   Anesthesia Plan Comments:        Anesthesia Quick Evaluation

## 2018-06-13 ENCOUNTER — Encounter (HOSPITAL_COMMUNITY)
Admission: AD | Disposition: A | Discharge status: Home / Self Care | Payer: Self-pay | Source: Ambulatory Visit | Attending: Cardiology

## 2018-06-13 LAB — GLUCOSE, CAPILLARY
Glucose-Capillary: 169 mg/dL — ABNORMAL HIGH (ref 70–99)
Glucose-Capillary: 151 mg/dL — ABNORMAL HIGH (ref 70–99)

## 2018-06-13 SURGERY — CANCELLED PROCEDURE

## 2018-06-13 MED ORDER — FERROUS SULFATE 325 (65 FE) MG PO TABS
325.0000 mg | ORAL_TABLET | Freq: Every day | ORAL | Status: DC
  Filled 2018-06-13: qty 1

## 2018-06-13 MED ORDER — AMIODARONE HCL 200 MG PO TABS: 200.0000 mg | ORAL_TABLET | Freq: Every day | ORAL | 3 refills | Status: DC

## 2018-06-13 MED ORDER — PALBOCICLIB 125 MG PO CAPS
125.0000 mg | ORAL_CAPSULE | Freq: Every day | ORAL | Status: DC
  Administered 2018-06-13: 125 mg via ORAL

## 2018-06-13 MED ORDER — AMIODARONE HCL 200 MG PO TABS
200.0000 mg | ORAL_TABLET | Freq: Two times a day (BID) | ORAL | Status: DC
  Administered 2018-06-13: 200 mg via ORAL
  Filled 2018-06-13: qty 1

## 2018-06-13 NOTE — Progress Notes (Signed)
Pt converted to NSR with PACs in the 70s-80s from A fib with RVR in the 100s-120s. Pt continues with Amiodarone drip at 30mg /hr. EKG confirmed. CCMD called to add QTC measurements. MD paged. Will continue to monitor.

## 2018-06-13 NOTE — Discharge Summary (Addendum)
DISCHARGE SUMMARY    Patient ID: Brittany Porter,  MRN: [id number], DOB/AGE: 75/13/1947 75 y.o.  Admit date: 06/11/2018 Discharge date: 06/13/2018  Primary Care Physician: [name], MD  Primary Cardiologist: Dr. Margaretann Loveless Electrophysiologist: Dr. Rayann Heman  Primary Discharge Diagnosis:  1. AFib (likely persistent) w/RVR    CHA2DS2Vasc is 3, on Eliquis, appropriately dosed  Secondary Discharge Diagnosis:  1. CM     No evidence or symptoms of fluid OL 2. DM   Allergies  Allergen Reactions  . Prednisolone Shortness Of Breath and Other (See Comments)     increased BP  . Prednisone Anaphylaxis  . Sulfa Antibiotics Swelling    "Eyes swelled shut" - reaction to eye drops     Procedures This Admission:  none  Brief HPI: Brittany Porter is a 73 y.o. female who was seen at the AFib clinic day of admission found her in rapid AFlutter (largely asymptomatic) with BP low 100's.  Dr. Caryl Comes saw the patient, offered ER vs direct admit for amiodarone, the patient with minimal if any symptoms opted to go home and wait for available bed/direct admission.   Hospital Course:  The patient's PMHx includes DM, CM (?etiology, ischemic), arthritis , and a long hx with cancer.  She was admitted, reporting compliance with her Eliquis, started on amiodarone gtt.  Her VSS remained stable through her stay, initially HR 120's-130's with which she remained largely asymptomatic with.   The patient converted to SR on drug and did not require DCCV.  She feels well this morning, no CP or SOB.  She would like to go home.  The patient was examined by Dr. Rayann Heman and considered stable for discharge to home. WIll have her take amiodarone 200mg  BID for 2 weeks then daily.  Early f/u with the AFib clinic is in place   Physical Exam: Vitals:   06/12/18 1944 06/12/18 2235 06/13/18 0100 06/13/18 0327  BP: (!) 146/97  (!) 132/45 133/66  Pulse: (!) 109 74 77 81  Resp: 18   18  Temp: 98.5 F (36.9 C)   97.8 F  (36.6 C)  TempSrc: Oral   Oral  SpO2: 94%  92% 97%  Weight:    92.6 kg  Height:        GEN- The patient is well appearing, alert and oriented x 3 today.   HEENT: normocephalic, atraumatic; sclera clear, conjunctiva pink; hearing intact; oropharynx clear; neck supple, no JVP Lungs- CTA b/l, normal work of breathing.  No wheezes, rales, rhonchi Heart- RRR, no murmurs, rubs or gallops, PMI not laterally displaced GI- soft, non-tender, non-distended Extremities- no clubbing, cyanosis, or edema MS- no significant deformity or atrophy Skin- warm and dry, no rash or lesion Psych- euthymic mood, full affect Neuro- no gross deficits   Labs:   Lab Results  Component Value Date   WBC 3.5 (L) 06/11/2018   HGB 10.2 (L) 06/11/2018   HCT 32.1 (L) 06/11/2018   MCV 107.7 (H) 06/11/2018   PLT 245 06/11/2018    Recent Labs  Lab 06/11/18 2203 06/12/18 0412  NA 140 140  K 4.7 4.4  CL 102 102  CO2 27 27  BUN 12 10  CREATININE 0.95 0.74  CALCIUM 8.1* 7.9*  PROT 5.9*  --   BILITOT 0.9  --   ALKPHOS 52  --   ALT 16  --   AST 23  --   GLUCOSE 254* 159*    Discharge Medications:  Allergies as of 06/13/2018  Reactions   Prednisolone Shortness Of Breath, Other (See Comments)    increased BP   Prednisone Anaphylaxis   Sulfa Antibiotics Swelling   "Eyes swelled shut" - reaction to eye drops      Medication List    TAKE these medications   amiodarone 200 MG tablet Commonly known as:  PACERONE Take 1 tablet (200 mg total) by mouth daily. START by taking 200mg  (1 tablet) twice daily, THEN reduce to 200mg  (1 tablet) once daily   CALCIUM 600+D3 600-800 MG-UNIT Tabs Generic drug:  Calcium Carb-Cholecalciferol Take 1 tablet by mouth daily.   ELIQUIS 5 MG Tabs tablet Generic drug:  apixaban Take 5 mg by mouth 2 (two) times daily.   ferrous sulfate 325 (65 FE) MG tablet Take 325 mg by mouth daily as needed (energy).   fish oil-omega-3 fatty acids 1000 MG capsule Take 1 g by  mouth 2 (two) times daily.   furosemide 20 MG tablet Commonly known as:  LASIX Take 20 mg by mouth daily as needed for fluid or edema. Reported on 05/07/2015   gabapentin 300 MG capsule Commonly known as:  NEURONTIN Take 1 capsule (300 mg total) by mouth 2 (two) times daily.   glimepiride 2 MG tablet Commonly known as:  AMARYL Take 2 mg by mouth daily with breakfast.   IBRANCE 125 MG capsule Generic drug:  palbociclib TAKE 1 CAPSULE (125 MG TOTAL) BY MOUTH DAILY WITH BREAKFAST. TAKE FOR 21 DAYS ON, 7 DAYS OFF. TAKE WHOLE WITH FOOD. What changed:  See the new instructions.   ibuprofen 200 MG tablet Commonly known as:  ADVIL,MOTRIN Take 400 mg by mouth every 6 (six) hours as needed for headache (pain). Reported on 11/12/2015   letrozole 2.5 MG tablet Commonly known as:  FEMARA Take 1 tablet (2.5 mg total) by mouth daily.   Magnesium Oxide 200 MG Tabs Commonly known as:  MAG-OXIDE Take 1 tablet (200 mg total) by mouth daily. What changed:  when to take this   metFORMIN 500 MG tablet Commonly known as:  GLUCOPHAGE Take 500 mg by mouth 2 (two) times daily with a meal.   metoprolol succinate 25 MG 24 hr tablet Commonly known as:  TOPROL XL Take 1 tablet (25 mg total) by mouth daily.   naproxen sodium 220 MG tablet Commonly known as:  ALEVE Take 220 mg by mouth daily as needed (pain).   simvastatin 40 MG tablet Commonly known as:  ZOCOR Take 40 mg by mouth at bedtime.   Vitamin D3 50 MCG (2000 UT) Tabs Take 2,000 mg by mouth daily.       Disposition: Home Discharge Instructions    Diet - low sodium heart healthy   Complete by:  As directed    Increase activity slowly   Complete by:  As directed      Follow-up Information    [facility].   Specialty:  Cardiology Contact information: [address] [id number] [place] [id number]          Duration of Discharge Encounter: Greater  than 30 minutes including physician time.  Signed, Tommye Standard, PA-C 06/13/2018 11:59 AM   I have seen, examined the patient, and reviewed the above assessment and plan.  Changes to above are made where necessary.  On exam, RRR.  She has converted to sinus with IV amiodarone.  Will transition to oral amiodarone with close follow-up in the AF clinic  Co Sign: Thompson Grayer, MD  06/13/2018    

## 2018-06-13 NOTE — Discharge Instructions (Signed)

## 2018-06-13 NOTE — Plan of Care (Signed)
  Problem: Elimination: Goal: Will not experience complications related to urinary retention Outcome: Progressing   Problem: Pain Managment: Goal: General experience of comfort will improve Outcome: Progressing   Problem: Activity: Goal: Ability to tolerate increased activity will improve Outcome: Progressing

## 2018-06-13 NOTE — Progress Notes (Signed)
PIV consult: Arrived to room, pt reported her procedure was canceled and she may go home today. Confirmed with Beverlee Nims, RN: no need for second IV site.

## 2018-06-13 NOTE — Progress Notes (Signed)
Patient was in pre-op and was in sinus rhythm, case was canceled per [place], PA request. Patient never went into intra-op.

## 2018-06-14 ENCOUNTER — Other Ambulatory Visit: Payer: Medicare Other

## 2018-06-14 ENCOUNTER — Inpatient Hospital Stay: Payer: Medicare Other | Attending: Hematology and Oncology

## 2018-06-14 VITALS — BP 143/62 | HR 77 | Temp 98.0°F | Resp 18

## 2018-06-14 DIAGNOSIS — C7951 Secondary malignant neoplasm of bone: Secondary | ICD-10-CM | POA: Insufficient documentation

## 2018-06-14 DIAGNOSIS — Z5111 Encounter for antineoplastic chemotherapy: Secondary | ICD-10-CM | POA: Insufficient documentation

## 2018-06-14 DIAGNOSIS — C7800 Secondary malignant neoplasm of unspecified lung: Secondary | ICD-10-CM | POA: Diagnosis not present

## 2018-06-14 DIAGNOSIS — C787 Secondary malignant neoplasm of liver and intrahepatic bile duct: Secondary | ICD-10-CM | POA: Insufficient documentation

## 2018-06-14 DIAGNOSIS — C50919 Malignant neoplasm of unspecified site of unspecified female breast: Secondary | ICD-10-CM | POA: Insufficient documentation

## 2018-06-14 MED ORDER — FULVESTRANT 250 MG/5ML IM SOLN
500.0000 mg | Freq: Once | INTRAMUSCULAR | Status: AC
  Administered 2018-06-14: 500 mg via INTRAMUSCULAR

## 2018-06-14 MED ORDER — FULVESTRANT 250 MG/5ML IM SOLN
INTRAMUSCULAR | Status: AC
  Filled 2018-06-14: qty 10

## 2018-06-20 ENCOUNTER — Other Ambulatory Visit: Payer: Self-pay

## 2018-06-20 ENCOUNTER — Encounter (HOSPITAL_COMMUNITY): Payer: Self-pay | Admitting: Nurse Practitioner

## 2018-06-20 ENCOUNTER — Ambulatory Visit (HOSPITAL_COMMUNITY)
Admission: RE | Admit: 2018-06-20 | Discharge: 2018-06-20 | Disposition: A | Discharge status: Home / Self Care | Payer: Medicare Other | Source: Ambulatory Visit | Attending: Nurse Practitioner | Admitting: Nurse Practitioner

## 2018-06-20 VITALS — BP 122/56 | HR 77 | Ht 63.0 in | Wt 209.0 lb

## 2018-06-20 DIAGNOSIS — Z803 Family history of malignant neoplasm of breast: Secondary | ICD-10-CM | POA: Diagnosis not present

## 2018-06-20 DIAGNOSIS — Z7901 Long term (current) use of anticoagulants: Secondary | ICD-10-CM | POA: Insufficient documentation

## 2018-06-20 DIAGNOSIS — Z7984 Long term (current) use of oral hypoglycemic drugs: Secondary | ICD-10-CM | POA: Insufficient documentation

## 2018-06-20 DIAGNOSIS — Z8542 Personal history of malignant neoplasm of other parts of uterus: Secondary | ICD-10-CM | POA: Diagnosis not present

## 2018-06-20 DIAGNOSIS — Z882 Allergy status to sulfonamides status: Secondary | ICD-10-CM | POA: Insufficient documentation

## 2018-06-20 DIAGNOSIS — Z8543 Personal history of malignant neoplasm of ovary: Secondary | ICD-10-CM | POA: Insufficient documentation

## 2018-06-20 DIAGNOSIS — I4891 Unspecified atrial fibrillation: Secondary | ICD-10-CM | POA: Insufficient documentation

## 2018-06-20 DIAGNOSIS — Z808 Family history of malignant neoplasm of other organs or systems: Secondary | ICD-10-CM | POA: Insufficient documentation

## 2018-06-20 DIAGNOSIS — Z8249 Family history of ischemic heart disease and other diseases of the circulatory system: Secondary | ICD-10-CM | POA: Insufficient documentation

## 2018-06-20 DIAGNOSIS — Z79811 Long term (current) use of aromatase inhibitors: Secondary | ICD-10-CM | POA: Insufficient documentation

## 2018-06-20 DIAGNOSIS — Z9013 Acquired absence of bilateral breasts and nipples: Secondary | ICD-10-CM | POA: Insufficient documentation

## 2018-06-20 DIAGNOSIS — Z888 Allergy status to other drugs, medicaments and biological substances status: Secondary | ICD-10-CM | POA: Insufficient documentation

## 2018-06-20 DIAGNOSIS — C50919 Malignant neoplasm of unspecified site of unspecified female breast: Secondary | ICD-10-CM | POA: Diagnosis not present

## 2018-06-20 DIAGNOSIS — I4819 Other persistent atrial fibrillation: Secondary | ICD-10-CM | POA: Diagnosis not present

## 2018-06-20 DIAGNOSIS — Z800 Family history of malignant neoplasm of digestive organs: Secondary | ICD-10-CM | POA: Insufficient documentation

## 2018-06-20 DIAGNOSIS — I4892 Unspecified atrial flutter: Secondary | ICD-10-CM | POA: Insufficient documentation

## 2018-06-20 DIAGNOSIS — E785 Hyperlipidemia, unspecified: Secondary | ICD-10-CM | POA: Diagnosis not present

## 2018-06-20 DIAGNOSIS — Z79899 Other long term (current) drug therapy: Secondary | ICD-10-CM | POA: Diagnosis not present

## 2018-06-20 DIAGNOSIS — Z923 Personal history of irradiation: Secondary | ICD-10-CM | POA: Diagnosis not present

## 2018-06-20 DIAGNOSIS — E119 Type 2 diabetes mellitus without complications: Secondary | ICD-10-CM | POA: Diagnosis not present

## 2018-06-20 NOTE — Progress Notes (Addendum)
Primary Care Physician: [name], MD Referring Physician: [facility] ER f/u   Brittany Porter is a 73 y.o. female with a h/o Breast CA with  mets to the [place] found 03/31/16, on Faslodex and Ibrance, DM, that was in the ER, 01/09/18, for new onset of atrial flutter with RVR. V rate was around 170 bpm. The pt did not feel the episode but it was picked up on routine check of V/S. She  was not started on anticoagulation for concerns of her therapy for h/o breast cancer. CHA2DS2VASc score was 3. She was found to be in typical atrial flutter in the ER. When I initially  saw her 01/16/18,  she was in rhythm. . No tobacco, alcohol, snoring. Is sedentary and obese. She was  started on eliquis.  She saw Dr. Rayann Heman in f/u but was not interested in a flutter ablation. She returned to the ER last Wednesday, 06/06/18 and had a cardioversion for afib with RVR. She is now in the afib clinic, 06/11/18  and is in afib with RVR around 170 bpm. BP  soft at 212 asystolic. She is tolerating well but discussed with  Dr. Caryl Comes, that saw her in the clinic as well today , and discussed that  hospitalization would be probably be best way to further treat arrhythmia. She is in agreement.  Today, she denies symptoms of palpitations, chest pain, shortness of breath, orthopnea, PND, lower extremity edema, dizziness, presyncope, syncope, or neurologic sequela. The patient is tolerating medications without difficulties and is otherwise without complaint today.   Past Medical History:  Diagnosis Date  . Arthritis   . Breast cancer ([place])   . Diabetes mellitus   . Endometrial carcinoma ([place]) 10/2008  . Family history of breast cancer   . Family history of colon cancer   . Family history of skin cancer   . Fluid overload   . History of radiation therapy 04/13/2009, 04/16/2009, 04/27/2009, 05/07/2009, 05/18/2009   3000 cGy to proximal vagina  . Hyperlipidemia   . Iron deficiency   . Melanoma ([place]) 2010   rt arm and back  .  Ovarian cancer ([place])   . Pleural effusion 04/2010   Past Surgical History:  Procedure Laterality Date  . ABDOMINAL HYSTERECTOMY  10/2008  . INSERTION OF MESH  05/28/2012   Procedure: INSERTION OF MESH;  Surgeon: Adin Hector, MD;  Location: [facility];  Service: General;  Laterality: N/A;  . KNEE ARTHROSCOPY  04/2010   Right knee  . LAPAROSCOPIC LYSIS OF ADHESIONS  05/28/2012   Procedure: LAPAROSCOPIC LYSIS OF ADHESIONS;  Surgeon: Adin Hector, MD;  Location: [facility];  Service: General;  Laterality: N/A;  . MASTECTOMY  1997   Bilateral with lymph nodes  . Melanoma removal  02/2009, 07/2009  . VENTRAL HERNIA REPAIR  05/28/2012   Procedure: LAPAROSCOPIC VENTRAL HERNIA;  Surgeon: Adin Hector, MD;  Location: [facility];  Service: General;  Laterality: N/A;    Current Outpatient Medications  Medication Sig Dispense Refill  . amiodarone (PACERONE) 200 MG tablet Take 1 tablet (200 mg total) by mouth daily. START by taking 222m (1 tablet) twice daily, THEN reduce to 2074m(1 tablet) once daily 42 tablet 3  . apixaban (ELIQUIS) 5 MG TABS tablet Take 5 mg by mouth 2 (two) times daily.    . Calcium Carb-Cholecalciferol (CALCIUM 600+D3) 600-800 MG-UNIT TABS Take 1 tablet by mouth daily.     . Cholecalciferol (VITAMIN D3) 2000 UNITS TABS Take 2,000 mg  by mouth daily.    . ferrous sulfate 325 (65 FE) MG tablet Take 325 mg by mouth daily as needed (energy).     . fish oil-omega-3 fatty acids 1000 MG capsule Take 1 g by mouth 2 (two) times daily.     . furosemide (LASIX) 20 MG tablet Take 20 mg by mouth daily as needed for fluid or edema. Reported on 05/07/2015    . gabapentin (NEURONTIN) 300 MG capsule Take 1 capsule (300 mg total) by mouth 2 (two) times daily.    Marland Kitchen glimepiride (AMARYL) 2 MG tablet Take 2 mg by mouth daily with breakfast.    . IBRANCE 125 MG capsule TAKE 1 CAPSULE (125 MG TOTAL) BY MOUTH DAILY WITH BREAKFAST. TAKE FOR 21 DAYS ON, 7 DAYS OFF. TAKE WHOLE WITH FOOD. (Patient taking  differently: Take 125 mg by mouth daily with breakfast. Take for 21 days on, 7 days off. Take whole with food.) 21 capsule 2  . ibuprofen (ADVIL,MOTRIN) 200 MG tablet Take 400 mg by mouth every 6 (six) hours as needed for headache (pain). Reported on 11/12/2015    . letrozole (FEMARA) 2.5 MG tablet Take 1 tablet (2.5 mg total) by mouth daily. 30 tablet 0  . Magnesium Oxide (MAG-OXIDE) 200 MG TABS Take 1 tablet (200 mg total) by mouth daily. (Patient taking differently: Take 200 mg by mouth at bedtime. ) 30 tablet 0  . metFORMIN (GLUCOPHAGE) 500 MG tablet Take 500 mg by mouth 2 (two) times daily with a meal.      . metoprolol succinate (TOPROL XL) 25 MG 24 hr tablet Take 1 tablet (25 mg total) by mouth daily. 30 tablet 1  . naproxen sodium (ALEVE) 220 MG tablet Take 220 mg by mouth daily as needed (pain).    . simvastatin (ZOCOR) 40 MG tablet Take 40 mg by mouth at bedtime.       No current facility-administered medications for this encounter.     Allergies  Allergen Reactions  . Prednisolone Shortness Of Breath and Other (See Comments)     increased BP  . Prednisone Anaphylaxis  . Sulfa Antibiotics Swelling    "Eyes swelled shut" - reaction to eye drops    Social History   Socioeconomic History  . Marital status: Married    Spouse name: Not on file  . Number of children: Not on file  . Years of education: Not on file  . Highest education level: Not on file  Occupational History  . Not on file  Social Needs  . Financial resource strain: Not on file  . Food insecurity:    Worry: Not on file    Inability: Not on file  . Transportation needs:    Medical: Not on file    Non-medical: Not on file  Tobacco Use  . Smoking status: Never Smoker  . Smokeless tobacco: Never Used  Substance and Sexual Activity  . Alcohol use: No  . Drug use: No  . Sexual activity: Yes  Lifestyle  . Physical activity:    Days per week: Not on file    Minutes per session: Not on file  . Stress: Not  on file  Relationships  . Social connections:    Talks on phone: Not on file    Gets together: Not on file    Attends religious service: Not on file    Active member of club or organization: Not on file    Attends meetings of clubs or organizations: Not on file  Relationship status: Not on file  . Intimate partner violence:    Fear of current or ex partner: Not on file    Emotionally abused: Not on file    Physically abused: Not on file    Forced sexual activity: Not on file  Other Topics Concern  . Not on file  Social History Narrative  . Not on file    Family History  Problem Relation Age of Onset  . Stroke Mother   . Cancer Father        'voice box'  . Colon cancer Sister        dx >50  . Heart attack Brother   . Skin cancer Brother 76  . Cancer Sister        primary site unk, dx >50  . Stroke Brother   . Stroke Sister   . Colon cancer Sister 80  . Dementia Sister   . Breast cancer Sister        dx >5-, met to brain    ROS- All systems are reviewed and negative except as per the HPI above  Physical Exam: Vitals:   06/20/18 1351  BP: (!) 122/56  Pulse: 77  Weight: 94.8 kg  Height: _0  (1.6 m)   Wt Readings from Last 3 Encounters:  06/20/18 94.8 kg  06/13/18 92.6 kg  06/11/18 94.8 kg    Labs: Lab Results  Component Value Date   NA 140 06/12/2018   K 4.4 06/12/2018   CL 102 06/12/2018   CO2 27 06/12/2018   GLUCOSE 159 (H) 06/12/2018   BUN 10 06/12/2018   CREATININE 0.74 06/12/2018   CALCIUM 7.9 (L) 06/12/2018   MG 2.1 06/12/2018   Lab Results  Component Value Date   INR 2.08 06/06/2018   Lab Results  Component Value Date   CHOL 153 12/23/2008   HDL 60 12/23/2008   [place] 75 12/23/2008   TRIG 92 12/23/2008     GEN- The patient is well appearing, alert and oriented x 3 today.   Head- normocephalic, atraumatic Eyes-  Sclera clear, conjunctiva pink Ears- hearing intact Oropharynx- clear Neck- supple, no JVP Lymph- no cervical  lymphadenopathy Lungs- Clear to ausculation bilaterally, normal work of breathing Heart- Regular rate and rhythm, no murmurs, rubs or gallops, PMI not laterally displaced GI- soft, NT, ND, + BS Extremities- no clubbing, cyanosis, or edema MS- no significant deformity or atrophy Skin- no rash or lesion Psych- euthymic mood, full affect Neuro- strength and sensation are intact  EKG-afib with RVR at 172 bpm, qrs int 70 ms, qtc 426 ms Epic records reviewed    Assessment and Plan: 1. H/o of typical a flutter and afib with RVR  Successful cardioversion for afib with RVR 06/06/18 but ERAF Pt was admitted 06/11/18 for return of afib with RVR and she was loaded on amiodarone drip and spontaneously converted   She has not missed any of her Eliquis, CHA2DS2VASc score of 3,(  as she is  to start xarelto for cost reasons after finishing her current eliquis) Continue metoprolol succinate 25 mg qd She will continue with amiodarone 200 mg bid but has been instructed to decrease dose to 200 mg daily 2 weeks after discharge  F/u in one month  Butch Penny C. Ysidro Ramsay, [facility] [address] [place], [place] [address] [phone]

## 2018-06-21 ENCOUNTER — Other Ambulatory Visit: Payer: Self-pay | Admitting: Hematology and Oncology

## 2018-06-21 DIAGNOSIS — C50919 Malignant neoplasm of unspecified site of unspecified female breast: Secondary | ICD-10-CM

## 2018-06-25 ENCOUNTER — Other Ambulatory Visit: Payer: Self-pay | Admitting: Internal Medicine

## 2018-06-25 ENCOUNTER — Ambulatory Visit: Payer: Medicare Other | Admitting: Internal Medicine

## 2018-06-28 MED FILL — IBRANCE 125 MG CAPSULE: 125 | 28 days supply | Qty: 21 | Fill #0

## 2018-06-29 ENCOUNTER — Other Ambulatory Visit: Payer: Self-pay | Admitting: *Deleted

## 2018-06-29 MED ORDER — METOPROLOL SUCCINATE ER 25 MG PO TB24: 25.0000 mg | ORAL_TABLET | Freq: Every day | ORAL | 6 refills | Status: DC

## 2018-06-29 NOTE — Telephone Encounter (Signed)
° ° ° °*  STAT* If patient is at the pharmacy, call can be transferred to refill team.   1. Which medications need to be refilled? (please list name of each medication and dose if known) metoprolol succinate (TOPROL XL) 25 MG 24 hr tablet  2. Which pharmacy/location (including street and city if local pharmacy) is medication to be sent to? [place], [place] - [address]  3. Do they need a 30 day or 90 day supply? [place]

## 2018-07-03 ENCOUNTER — Telehealth: Payer: Self-pay

## 2018-07-03 ENCOUNTER — Telehealth: Payer: Self-pay | Admitting: Hematology and Oncology

## 2018-07-03 NOTE — Telephone Encounter (Signed)
Scheduled appt per 2/18 sch message - pt is aware of appt date and time

## 2018-07-05 ENCOUNTER — Telehealth: Payer: Self-pay | Admitting: Adult Health

## 2018-07-05 ENCOUNTER — Other Ambulatory Visit: Payer: Self-pay | Admitting: Adult Health

## 2018-07-05 DIAGNOSIS — C50919 Malignant neoplasm of unspecified site of unspecified female breast: Secondary | ICD-10-CM

## 2018-07-05 NOTE — Progress Notes (Signed)
Spoke with patient about her upcoming PET scan and that insurance would no longer approve PET scans for her.  Reviewed that Dr. Lindi Adie recommends bone scan and CT chest abdomen and pelvis for her.  I have placed those orders and spoke with Darlena in managed care to get these authorized.  Wilber Bihari, NP

## 2018-07-05 NOTE — Telephone Encounter (Signed)
Received call from Colon Branch, MD, about peer to peer for Callen Zuba PET scan.  After discussion of patient PET scan imaging, which has been the primary modality for following her widely metastatic breast cancer, she let me know that her insurance will only approve 3 in a patient's lifetime.  Tranice has already undergone three PET scans.  She will not be approved for PET scan.  I reviewed with Dr. Lindi Adie.  Will discontinue PET scan and order bone scan and CT chest abdomen and pelvis.    Wilber Bihari, NP

## 2018-07-06 ENCOUNTER — Telehealth: Payer: Self-pay

## 2018-07-06 ENCOUNTER — Telehealth: Payer: Self-pay | Admitting: Hematology and Oncology

## 2018-07-06 NOTE — Telephone Encounter (Signed)
Patient requesting appointments to be rescheduled from 07/16/2018 due to scans being after this date.  Pt aware that scheduling message has been sent.  No further needs at this time.

## 2018-07-06 NOTE — Telephone Encounter (Signed)
R/s appt per 2/21 sch message - pt is aware of appt d/t

## 2018-07-12 ENCOUNTER — Inpatient Hospital Stay: Payer: Medicare Other

## 2018-07-12 ENCOUNTER — Ambulatory Visit: Payer: Medicare Other | Admitting: Hematology and Oncology

## 2018-07-13 ENCOUNTER — Encounter (HOSPITAL_COMMUNITY): Payer: Medicare Other

## 2018-07-16 ENCOUNTER — Telehealth: Payer: Self-pay

## 2018-07-16 ENCOUNTER — Ambulatory Visit: Payer: Medicare Other | Admitting: Hematology and Oncology

## 2018-07-16 ENCOUNTER — Ambulatory Visit: Payer: Medicare Other

## 2018-07-16 NOTE — Telephone Encounter (Signed)
Spoke with patient to schedule lab appointment before CT tomorrow.

## 2018-07-16 NOTE — Telephone Encounter (Signed)
Oral Oncology Patient Advocate Encounter  I was successful at securing a grant with [facility] for $15,000. This will keep the out of pocket expense for Ibrance at $0. The grant information is as follows and has been shared with [place].  Approval dates: 06/13/18-06/13/19 ID: [id number] Group: [id number] BIN: [id number] PCN: [id number]  I called the patient and gave her the good news, she verbalized understanding and great appreciation.   [place] Patient [place] Phone [phone] Fax [phone] 07/16/2018   11:02 AM

## 2018-07-17 ENCOUNTER — Ambulatory Visit (HOSPITAL_COMMUNITY)
Admission: RE | Admit: 2018-07-17 | Discharge: 2018-07-17 | Disposition: A | Discharge status: Home / Self Care | Payer: Medicare Other | Source: Ambulatory Visit | Attending: Adult Health | Admitting: Adult Health

## 2018-07-17 ENCOUNTER — Encounter (HOSPITAL_COMMUNITY)
Admission: RE | Admit: 2018-07-17 | Discharge: 2018-07-17 | Disposition: A | Discharge status: Home / Self Care | Payer: Medicare Other | Source: Ambulatory Visit | Attending: Adult Health | Admitting: Adult Health

## 2018-07-17 ENCOUNTER — Encounter (HOSPITAL_COMMUNITY): Payer: Self-pay

## 2018-07-17 ENCOUNTER — Inpatient Hospital Stay: Payer: Medicare Other | Attending: Hematology and Oncology

## 2018-07-17 DIAGNOSIS — Z79899 Other long term (current) drug therapy: Secondary | ICD-10-CM | POA: Diagnosis not present

## 2018-07-17 DIAGNOSIS — C782 Secondary malignant neoplasm of pleura: Secondary | ICD-10-CM | POA: Insufficient documentation

## 2018-07-17 DIAGNOSIS — R530 Neoplastic (malignant) related fatigue: Secondary | ICD-10-CM | POA: Insufficient documentation

## 2018-07-17 DIAGNOSIS — D709 Neutropenia, unspecified: Secondary | ICD-10-CM | POA: Diagnosis not present

## 2018-07-17 DIAGNOSIS — C50919 Malignant neoplasm of unspecified site of unspecified female breast: Secondary | ICD-10-CM

## 2018-07-17 DIAGNOSIS — C7951 Secondary malignant neoplasm of bone: Secondary | ICD-10-CM | POA: Insufficient documentation

## 2018-07-17 DIAGNOSIS — C50911 Malignant neoplasm of unspecified site of right female breast: Secondary | ICD-10-CM

## 2018-07-17 DIAGNOSIS — C787 Secondary malignant neoplasm of liver and intrahepatic bile duct: Secondary | ICD-10-CM | POA: Diagnosis not present

## 2018-07-17 LAB — CBC WITH DIFFERENTIAL (CANCER CENTER ONLY)
Abs Immature Granulocytes: 0.02 10*3/uL (ref 0.00–0.07)
Basophils Absolute: 0 10*3/uL (ref 0.0–0.1)
Basophils Relative: 1 %
Eosinophils Absolute: 0 10*3/uL (ref 0.0–0.5)
Eosinophils Relative: 1 %
HCT: 32.7 % — ABNORMAL LOW (ref 36.0–46.0)
Hemoglobin: 10.7 g/dL — ABNORMAL LOW (ref 12.0–15.0)
Immature Granulocytes: 1 %
Lymphocytes Relative: 32 %
Lymphs Abs: 0.9 10*3/uL (ref 0.7–4.0)
MCH: 36.5 pg — ABNORMAL HIGH (ref 26.0–34.0)
MCHC: 32.7 g/dL (ref 30.0–36.0)
MCV: 111.6 fL — ABNORMAL HIGH (ref 80.0–100.0)
Monocytes Absolute: 0.1 10*3/uL (ref 0.1–1.0)
Monocytes Relative: 4 %
Neutro Abs: 1.7 10*3/uL (ref 1.7–7.7)
Neutrophils Relative %: 61 %
Platelet Count: 206 10*3/uL (ref 150–400)
RBC: 2.93 MIL/uL — ABNORMAL LOW (ref 3.87–5.11)
RDW: 17.9 % — ABNORMAL HIGH (ref 11.5–15.5)
WBC Count: 2.7 10*3/uL — ABNORMAL LOW (ref 4.0–10.5)
nRBC: 0 % (ref 0.0–0.2)

## 2018-07-17 LAB — CMP (CANCER CENTER ONLY)
ALT: 9 U/L (ref 0–44)
AST: 20 U/L (ref 15–41)
Albumin: 3.4 g/dL — ABNORMAL LOW (ref 3.5–5.0)
Alkaline Phosphatase: 47 U/L (ref 38–126)
Anion gap: 10 (ref 5–15)
BUN: 16 mg/dL (ref 8–23)
CO2: 28 mmol/L (ref 22–32)
Calcium: 9 mg/dL (ref 8.9–10.3)
Chloride: 104 mmol/L (ref 98–111)
Creatinine: 0.9 mg/dL (ref 0.44–1.00)
GFR, Est AFR Am: >60 mL/min (ref >60)
GFR, Estimated: >60 mL/min (ref >60)
Glucose, Bld: 138 mg/dL — ABNORMAL HIGH (ref 70–99)
Potassium: 4.9 mmol/L (ref 3.5–5.1)
Sodium: 142 mmol/L (ref 135–145)
Total Bilirubin: 1 mg/dL (ref 0.3–1.2)
Total Protein: 6.6 g/dL (ref 6.5–8.1)

## 2018-07-17 MED ORDER — FLUDEOXYGLUCOSE F - 18 (FDG) INJECTION
21.2000 | Freq: Once | INTRAVENOUS | Status: AC | PRN
  Administered 2018-07-17: 21.2 via INTRAVENOUS

## 2018-07-17 MED ORDER — SODIUM CHLORIDE (PF) 0.9 % IJ SOLN
INTRAMUSCULAR | Status: AC
  Filled 2018-07-17: qty 50

## 2018-07-17 MED ORDER — IOHEXOL 300 MG/ML  SOLN
100.0000 mL | Freq: Once | INTRAMUSCULAR | Status: AC | PRN
  Administered 2018-07-17: 100 mL via INTRAVENOUS

## 2018-07-19 ENCOUNTER — Ambulatory Visit (HOSPITAL_COMMUNITY)
Admission: RE | Admit: 2018-07-19 | Discharge: 2018-07-19 | Disposition: A | Discharge status: Home / Self Care | Payer: Medicare Other | Source: Ambulatory Visit | Attending: Nurse Practitioner | Admitting: Nurse Practitioner

## 2018-07-19 ENCOUNTER — Other Ambulatory Visit: Payer: Self-pay

## 2018-07-19 VITALS — BP 128/74 | HR 83 | Ht 63.5 in | Wt 211.6 lb

## 2018-07-19 DIAGNOSIS — C50919 Malignant neoplasm of unspecified site of unspecified female breast: Secondary | ICD-10-CM | POA: Insufficient documentation

## 2018-07-19 DIAGNOSIS — C569 Malignant neoplasm of unspecified ovary: Secondary | ICD-10-CM | POA: Diagnosis not present

## 2018-07-19 DIAGNOSIS — Z7984 Long term (current) use of oral hypoglycemic drugs: Secondary | ICD-10-CM | POA: Insufficient documentation

## 2018-07-19 DIAGNOSIS — Z808 Family history of malignant neoplasm of other organs or systems: Secondary | ICD-10-CM | POA: Diagnosis not present

## 2018-07-19 DIAGNOSIS — Z9013 Acquired absence of bilateral breasts and nipples: Secondary | ICD-10-CM | POA: Diagnosis not present

## 2018-07-19 DIAGNOSIS — Z882 Allergy status to sulfonamides status: Secondary | ICD-10-CM | POA: Insufficient documentation

## 2018-07-19 DIAGNOSIS — I4892 Unspecified atrial flutter: Secondary | ICD-10-CM | POA: Insufficient documentation

## 2018-07-19 DIAGNOSIS — Z7901 Long term (current) use of anticoagulants: Secondary | ICD-10-CM | POA: Insufficient documentation

## 2018-07-19 DIAGNOSIS — E611 Iron deficiency: Secondary | ICD-10-CM | POA: Diagnosis not present

## 2018-07-19 DIAGNOSIS — Z79899 Other long term (current) drug therapy: Secondary | ICD-10-CM | POA: Insufficient documentation

## 2018-07-19 DIAGNOSIS — E119 Type 2 diabetes mellitus without complications: Secondary | ICD-10-CM | POA: Insufficient documentation

## 2018-07-19 DIAGNOSIS — C541 Malignant neoplasm of endometrium: Secondary | ICD-10-CM | POA: Diagnosis not present

## 2018-07-19 DIAGNOSIS — Z800 Family history of malignant neoplasm of digestive organs: Secondary | ICD-10-CM | POA: Diagnosis not present

## 2018-07-19 DIAGNOSIS — Z803 Family history of malignant neoplasm of breast: Secondary | ICD-10-CM | POA: Diagnosis not present

## 2018-07-19 DIAGNOSIS — I4819 Other persistent atrial fibrillation: Secondary | ICD-10-CM

## 2018-07-19 DIAGNOSIS — Z79811 Long term (current) use of aromatase inhibitors: Secondary | ICD-10-CM | POA: Insufficient documentation

## 2018-07-19 DIAGNOSIS — I4891 Unspecified atrial fibrillation: Secondary | ICD-10-CM | POA: Insufficient documentation

## 2018-07-19 DIAGNOSIS — C787 Secondary malignant neoplasm of liver and intrahepatic bile duct: Secondary | ICD-10-CM | POA: Insufficient documentation

## 2018-07-19 DIAGNOSIS — E785 Hyperlipidemia, unspecified: Secondary | ICD-10-CM | POA: Insufficient documentation

## 2018-07-19 DIAGNOSIS — Z8249 Family history of ischemic heart disease and other diseases of the circulatory system: Secondary | ICD-10-CM | POA: Insufficient documentation

## 2018-07-19 NOTE — Progress Notes (Signed)
Patient Care Team: [name], MD as PCP - General (Family Medicine)  DIAGNOSIS:    ICD-10-CM   1. 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 BRCA reportedly normal.    08/25/2015 Relapse/Recurrence    new solitary liver lesion, biopsy showing metastatic ER PR + breast    09/25/2015 - 03/11/2016 Chemotherapy    Xeloda. Stopped due to progression of liver and bone metastases; pleural metastases    03/31/2016 -  Anti-estrogen oral therapy    Foslodex started 03/31/16, currently monthly  Ibrance 160m 3 weeks on, 1 week off started 03/31/16 Xgeva started on 05/05/16, currently every 3 months  Letrozole daily added 08/01/17     03/20/2018 Genetic Testing    Genetic testing performed through [facility]'s Multi-Cancer reported out on 03/19/2018 showed no pathogenic mutations. The Multi-Cancer Panel offered by [facility] includes sequencing and/or deletion duplication testing of the following 91 genes: AIP, ALK, APC, ATM, AXIN2, BAP1, BARD1, BLM, BMPR1A, BRCA1, BRCA2, BRIP1, BUB1B, CASR, CDC73, CDH1, CDK4, CDKN1B, CDKN1C, CDKN2A, CEBPA, CEP57, CHEK2, CTNNA1, DICER1, DIS3L2, EGFR, ENG, EPCAM, FH, FLCN, GALNT12, GATA2, GPC3, GREM1, HOXB13, HRAS, KIT, MAX, MEN1, MET, MITF, MLH1, MLH3, MSH2, MSH3, MSH6, MUTYH, NBN, NF1, NF2, NTHL1, PALB2,  PDGFRA, PHOX2B, PMS2, POLD1, POLE, POT1, PRKAR1A, PTCH1, PTEN, RAD50, RAD51C, RAD51D, RB1, RECQL4, RET, RNF43, RPS20, RUNX1, SDHA, SDHAF2, SDHB, SDHC, SDHD, SMAD4, SMARCA4, SMARCB1, SMARCE1, STK11, SUFU, TERC, TERT, TMEM127, TP53, TSC1, TSC2, VHL, WRN, WT1  A variant of uncertain significance (VUS) in a gene called MET was also noted. c.[id number] (p.Thr557Ala)    04/08/2018 Miscellaneous    [facility] molecular testing: Androgen receptor positive, PDL 1 neg, MSI stable, NTRK1/2/3Neg, T MBB intermediate 9 mutations/Mb, ESR 1-, PI 3 CA negative, BRCA negative     Carcinoma of breast metastatic to bone ([place]   03/27/2016 Initial Diagnosis    [place] breast [place] to bone ([place]    03/31/2016 -  Chemotherapy    Foslodex started 03/31/16, currently monthly  Ibrance 1257m3 weeks on, 1 week off started 03/31/16 Daily letrozole added 08/01/17      Breast cancer metastasized to liver, unspecified laterality ([facility]   03/27/2016 Initial Diagnosis    Breast cancer metastasized to liver, unspecified laterality ([facility]    CHIEF COMPLIANT: Follow-up of metastatic breast cancer on Ibrance with Faslodex  INTERVAL HISTORY: RuALEXYSS BALZARINIs a 89.o. with above-mentioned history of metastatic breast cancer with metastases to the bone, liver, and lung who is currently on Ibrance with Faslodex. A CT CAP from 07/17/18 showed slight progression of disease with enlargement of masses in the left hemithorax, liver and bones. A bone scan on 07/17/18 showed multiple sites of osseous metastatic disease. She presents to the clinic alone today and reports stable energy levels and no new pain, although she has a  history of mild lower back pain.   REVIEW OF SYSTEMS:   Constitutional: Denies fevers, chills or abnormal weight loss Eyes: Denies blurriness of vision Ears, nose, mouth, throat, and face: Denies mucositis or sore throat Respiratory: Denies cough, dyspnea or wheezes Cardiovascular: Denies  palpitation, chest discomfort Gastrointestinal: Denies nausea, heartburn or change in bowel habits Skin: Denies abnormal skin rashes MSK: (+) low back pain Lymphatics: Denies new lymphadenopathy or easy bruising Neurological: Denies numbness, tingling or new weaknesses Behavioral/Psych: Mood is stable, no new changes  Extremities: No lower extremity edema Breast: denies any pain or lumps or nodules in either breasts All other systems were reviewed with the patient and are negative.  I have reviewed the past medical history, past surgical history, social history and family history with the patient and they are unchanged from previous note.  ALLERGIES:  is allergic to prednisolone; prednisone; and sulfa antibiotics.  MEDICATIONS:  Current Outpatient Medications  Medication Sig Dispense Refill  . amiodarone (PACERONE) 200 MG tablet Take 1 tablet (200 mg total) by mouth daily. START by taking 257m (1 tablet) twice daily, THEN reduce to 2038m(1 tablet) once daily 42 tablet 3  . Calcium Carb-Cholecalciferol (CALCIUM 600+D3) 600-800 MG-UNIT TABS Take 1 tablet by mouth daily.     . Cholecalciferol (VITAMIN D3) 2000 UNITS TABS Take 2,000 mg by mouth daily.    . Marland Kitchenverolimus (AFINITOR) 10 MG tablet Take 1 tablet (10 mg total) by mouth daily. 30 tablet 3  . exemestane (AROMASIN) 25 MG tablet Take 1 tablet (25 mg total) by mouth daily after breakfast. 90 tablet 3  . ferrous sulfate 325 (65 FE) MG tablet Take 325 mg by mouth daily as needed (energy).     . fish oil-omega-3 fatty acids 1000 MG capsule Take 1 g by mouth 2 (two) times daily.     . furosemide (LASIX) 20 MG tablet Take 20 mg by mouth daily as needed for fluid or edema. Reported on 05/07/2015    . gabapentin (NEURONTIN) 300 MG capsule Take 1 capsule (300 mg total) by mouth 2 (two) times daily.    . Marland Kitchenlimepiride (AMARYL) 2 MG tablet Take 2 mg by mouth daily with breakfast.    . ibuprofen (ADVIL,MOTRIN) 200 MG tablet Take 400 mg by mouth every  6 (six) hours as needed for headache (pain). Reported on 11/12/2015    . Magnesium Oxide (MAG-OXIDE) 200 MG TABS Take 1 tablet (200 mg total) by mouth daily. (Patient taking differently: Take 200 mg by mouth at bedtime. ) 30 tablet 0  . metFORMIN (GLUCOPHAGE) 500 MG tablet Take 500 mg by mouth 2 (two) times daily with a meal.      . metoprolol succinate (TOPROL-XL) 25 MG 24 hr tablet Take 1 tablet (25 mg total) by mouth daily. 30 tablet 6  . naproxen sodium (ALEVE) 220 MG tablet Take 220 mg by mouth daily as needed (pain).    . rivaroxaban (XARELTO) 20 MG TABS tablet Take 20 mg by mouth daily with supper.    . simvastatin (ZOCOR) 40 MG tablet Take 40 mg by mouth at bedtime.       No current facility-administered medications for this visit.     PHYSICAL EXAMINATION: ECOG PERFORMANCE STATUS: 1 - Symptomatic but completely ambulatory  Vitals:   07/20/18 1041  BP: 131/60  Pulse: 77  Resp: 17  Temp: 98.4 F (36.9 C)  SpO2: 94%   Filed Weights   07/20/18 1041  Weight: 210 lb 14.4 oz (95.7 kg)  GENERAL: alert, no distress and comfortable SKIN: skin color, texture, turgor are normal, no rashes or significant lesions EYES: normal, Conjunctiva are pink and non-injected, sclera clear OROPHARYNX: no exudate, no erythema and lips, buccal mucosa, and tongue normal  NECK: supple, thyroid normal size, non-tender, without nodularity LYMPH: no palpable lymphadenopathy in the cervical, axillary or inguinal LUNGS: clear to auscultation and percussion with normal breathing effort HEART: regular rate & rhythm and no murmurs and no lower extremity edema ABDOMEN: abdomen soft, non-tender and normal bowel sounds MUSCULOSKELETAL: no cyanosis of digits and no clubbing  NEURO: alert & oriented x 3 with fluent speech, no focal motor/sensory deficits EXTREMITIES: No lower extremity edema  LABORATORY DATA:  I have reviewed the data as listed CMP Latest Ref Rng & Units 07/17/2018 06/12/2018 06/11/2018    Glucose 70 - 99 mg/dL 138(H) 159(H) 254(H)  BUN 8 - 23 mg/dL _0 Creatinine 0.44 - 1.00 mg/dL 0.90 0.74 0.95  Sodium 135 - 145 mmol/L 142 140 140  Potassium 3.5 - 5.1 mmol/L 4.9 4.4 4.7  Chloride 98 - 111 mmol/L 104 102 102  CO2 22 - 32 mmol/L _1 Calcium 8.9 - 10.3 mg/dL 9.0 7.9(L) 8.1(L)  Total Protein 6.5 - 8.1 g/dL 6.6 - 5.9(L)  Total Bilirubin 0.3 - 1.2 mg/dL 1.0 - 0.9  Alkaline Phos 38 - 126 U/L 47 - 52  AST 15 - 41 U/L 20 - 23  ALT 0 - 44 U/L 9 - 16    Lab Results  Component Value Date   WBC 2.7 (L) 07/17/2018   HGB 10.7 (L) 07/17/2018   HCT 32.7 (L) 07/17/2018   MCV 111.6 (H) 07/17/2018   PLT 206 07/17/2018   NEUTROABS 1.7 07/17/2018    ASSESSMENT & PLAN:  Breast cancer metastasized to liver [facility]) Metastatic breast cancer: recently progressive in liver and bone, also pleura. OnFaslodex + Ibrance beginning 03/31/2016  Toxicities with therapy: 1. Neutropenia: Mild does not require dose adjustment. 2. injection site discomfort very mild 3. Fatigue related to [employer] molecular testing: Androgen receptor positive, PDL 1 neg, MSI stable, NTRK1/2/3Neg, T MBB intermediate 9 mutations/Mb, ESR 1-, PI 3 CA negative, BRCA negative  CT CAP 07/18/2018: Progressive disease with enlargement of several masslike areas in the base of left hemithorax, increased number and size of hypovascular hepatic lesions (4.6 cm, 1.7 cm, 2.4 cm, 2.7 cm) and bone metastases (sclerotic lesions throughout axial and appendicular skeleton similar in number but increased size).  Bone scan 07/18/2018: Uptake at multiple levels proximal right humerus, sternum, right iliac bone, lateral left fourth or fifth rib, inferior right scapula She will be scheduled for monthly Faslodex and every 35-monthXgeva injections.  Radiology review: Based upon the progression of her disease, I discussed different treatment options including 1.  Exemestane with everolimus 2.  Chemotherapy with oral Xeloda  versus IV chemotherapy.  After the discussion we decided to initiate treatment with exemestane and everolimus. I discussed the risks and benefits of this treatment including the risk of mouth sores fatigue and liver function abnormalities.  We will discontinue Faslodex injections. She will continue with Xgeva injections for bone mets.  We will reassess her on 08/09/2018 to see how she is tolerating the treatment.     Orders Placed This Encounter  Procedures  . CBC with Differential (Cancer Center Only)    Standing Status:   Future    Standing Expiration Date:   07/20/2019  . CMP ([facility])  Standing Status:   Future    Standing Expiration Date:   07/20/2019  . Lipid panel    Standing Status:   Future    Standing Expiration Date:   07/20/2019   The patient has a good understanding of the overall plan. she agrees with it. she will call with any problems that may develop before the next visit here.  Nicholas Lose, MD 07/20/2018  Julious Oka Dorshimer am acting as scribe for Dr. Nicholas Lose.  I have reviewed the above documentation for accuracy and completeness, and I agree with the above.

## 2018-07-19 NOTE — Progress Notes (Signed)
Primary Care Physician: [name], MD Referring Physician: [facility] ER f/u   Brittany Porter is a 73 y.o. female with a h/o Breast CA with  mets to the [place] found 03/31/16, on Faslodex and Ibrance, DM, that was in the ER, 01/09/18, for new onset of atrial flutter with RVR. V rate was around 170 bpm. The pt did not feel the episode but it was picked up on routine check of V/S. She  was not started on anticoagulation for concerns of her therapy for h/o breast cancer. CHA2DS2VASc score was 3. She was found to be in typical atrial flutter in the ER. When I initially  saw her 01/16/18,  she was in rhythm. . No tobacco, alcohol, snoring. Is sedentary and obese. She was  started on eliquis.  She saw Dr. Rayann Heman in f/u but was not interested in a flutter ablation. She returned to the ER last Wednesday, 06/06/18 and had a cardioversion for afib with RVR. She is now in the afib clinic, 06/11/18  and is in afib with RVR around 170 bpm. BP  soft at 595 asystolic. She is tolerating well but discussed with  Dr. Caryl Comes, that saw her in the clinic as well today , and discussed that  hospitalization would be probably be best way to further treat arrhythmia. She is in agreement.  F/u in afib clinic. 3/5. She continues on amiodarone 200 mg daily. She is in SR and has not noted any afib. She has no complaints.  Today, she denies symptoms of palpitations, chest pain, shortness of breath, orthopnea, PND, lower extremity edema, dizziness, presyncope, syncope, or neurologic sequela. The patient is tolerating medications without difficulties and is otherwise without complaint today.   Past Medical History:  Diagnosis Date  . Arthritis   . Breast cancer ([place])   . Diabetes mellitus   . Endometrial carcinoma ([place]) 10/2008  . Family history of breast cancer   . Family history of colon cancer   . Family history of skin cancer   . Fluid overload   . History of radiation therapy 04/13/2009, 04/16/2009, 04/27/2009, 05/07/2009,  05/18/2009   3000 cGy to proximal vagina  . Hyperlipidemia   . Iron deficiency   . Melanoma ([place]) 2010   rt arm and back  . Ovarian cancer ([place])   . Pleural effusion 04/2010   Past Surgical History:  Procedure Laterality Date  . ABDOMINAL HYSTERECTOMY  10/2008  . INSERTION OF MESH  05/28/2012   Procedure: INSERTION OF MESH;  Surgeon: Adin Hector, MD;  Location: [place];  Service: General;  Laterality: N/A;  . KNEE ARTHROSCOPY  04/2010   Right knee  . LAPAROSCOPIC LYSIS OF ADHESIONS  05/28/2012   Procedure: LAPAROSCOPIC LYSIS OF ADHESIONS;  Surgeon: Adin Hector, MD;  Location: [place];  Service: General;  Laterality: N/A;  . MASTECTOMY  1997   Bilateral with lymph nodes  . Melanoma removal  02/2009, 07/2009  . VENTRAL HERNIA REPAIR  05/28/2012   Procedure: LAPAROSCOPIC VENTRAL HERNIA;  Surgeon: Adin Hector, MD;  Location: [place];  Service: General;  Laterality: N/A;    Current Outpatient Medications  Medication Sig Dispense Refill  . amiodarone (PACERONE) 200 MG tablet Take 1 tablet (200 mg total) by mouth daily. START by taking 221m (1 tablet) twice daily, THEN reduce to 2080m(1 tablet) once daily 42 tablet 3  . Cholecalciferol (VITAMIN D3) 2000 UNITS TABS Take 2,000 mg by mouth daily.    . ferrous sulfate 325 (  65 FE) MG tablet Take 325 mg by mouth daily as needed (energy).     . fish oil-omega-3 fatty acids 1000 MG capsule Take 1 g by mouth 2 (two) times daily.     . furosemide (LASIX) 20 MG tablet Take 20 mg by mouth daily as needed for fluid or edema. Reported on 05/07/2015    . gabapentin (NEURONTIN) 300 MG capsule Take 1 capsule (300 mg total) by mouth 2 (two) times daily.    Marland Kitchen glimepiride (AMARYL) 2 MG tablet Take 2 mg by mouth daily with breakfast.    . IBRANCE 125 MG capsule TAKE 1 CAPSULE (125 MG TOTAL) BY MOUTH DAILY WITH BREAKFAST. TAKE FOR 21 DAYS ON, 7 DAYS OFF. TAKE WHOLE WITH FOOD. 21 capsule 2  . ibuprofen (ADVIL,MOTRIN) 200 MG tablet Take 400 mg by mouth every  6 (six) hours as needed for headache (pain). Reported on 11/12/2015    . letrozole (FEMARA) 2.5 MG tablet Take 1 tablet (2.5 mg total) by mouth daily. 30 tablet 0  . Magnesium Oxide (MAG-OXIDE) 200 MG TABS Take 1 tablet (200 mg total) by mouth daily. (Patient taking differently: Take 200 mg by mouth at bedtime. ) 30 tablet 0  . metFORMIN (GLUCOPHAGE) 500 MG tablet Take 500 mg by mouth 2 (two) times daily with a meal.      . metoprolol succinate (TOPROL-XL) 25 MG 24 hr tablet Take 1 tablet (25 mg total) by mouth daily. 30 tablet 6  . naproxen sodium (ALEVE) 220 MG tablet Take 220 mg by mouth daily as needed (pain).    . rivaroxaban (XARELTO) 20 MG TABS tablet Take 20 mg by mouth daily with supper.    . simvastatin (ZOCOR) 40 MG tablet Take 40 mg by mouth at bedtime.      . Calcium Carb-Cholecalciferol (CALCIUM 600+D3) 600-800 MG-UNIT TABS Take 1 tablet by mouth daily.      No current facility-administered medications for this encounter.     Allergies  Allergen Reactions  . Prednisolone Shortness Of Breath and Other (See Comments)     increased BP  . Prednisone Anaphylaxis  . Sulfa Antibiotics Swelling    "Eyes swelled shut" - reaction to eye drops    Social History   Socioeconomic History  . Marital status: Married    Spouse name: Not on file  . Number of children: Not on file  . Years of education: Not on file  . Highest education level: Not on file  Occupational History  . Not on file  Social Needs  . Financial resource strain: Not on file  . Food insecurity:    Worry: Not on file    Inability: Not on file  . Transportation needs:    Medical: Not on file    Non-medical: Not on file  Tobacco Use  . Smoking status: Never Smoker  . Smokeless tobacco: Never Used  Substance and Sexual Activity  . Alcohol use: No  . Drug use: No  . Sexual activity: Yes  Lifestyle  . Physical activity:    Days per week: Not on file    Minutes per session: Not on file  . Stress: Not on file   Relationships  . Social connections:    Talks on phone: Not on file    Gets together: Not on file    Attends religious service: Not on file    Active member of club or organization: Not on file    Attends meetings of clubs or organizations: Not  on file    Relationship status: Not on file  . Intimate partner violence:    Fear of current or ex partner: Not on file    Emotionally abused: Not on file    Physically abused: Not on file    Forced sexual activity: Not on file  Other Topics Concern  . Not on file  Social History Narrative  . Not on file    Family History  Problem Relation Age of Onset  . Stroke Mother   . Cancer Father        'voice box'  . Colon cancer Sister        dx >50  . Heart attack Brother   . Skin cancer Brother 22  . Cancer Sister        primary site unk, dx >50  . Stroke Brother   . Stroke Sister   . Colon cancer Sister 28  . Dementia Sister   . Breast cancer Sister        dx >5-, met to brain    ROS- All systems are reviewed and negative except as per the HPI above  Physical Exam: Vitals:   07/19/18 1037  BP: 128/74  Pulse: 83  Weight: 96 kg  Height: 5' 3.5" (1.613 m)   Wt Readings from Last 3 Encounters:  07/19/18 96 kg  06/20/18 94.8 kg  06/13/18 92.6 kg    Labs: Lab Results  Component Value Date   NA 142 07/17/2018   K 4.9 07/17/2018   CL 104 07/17/2018   CO2 28 07/17/2018   GLUCOSE 138 (H) 07/17/2018   BUN 16 07/17/2018   CREATININE 0.90 07/17/2018   CALCIUM 9.0 07/17/2018   MG 2.1 06/12/2018   Lab Results  Component Value Date   INR 2.08 06/06/2018   Lab Results  Component Value Date   CHOL 153 12/23/2008   HDL 60 12/23/2008   [place] 75 12/23/2008   TRIG 92 12/23/2008     GEN- The patient is well appearing, alert and oriented x 3 today.   Head- normocephalic, atraumatic Eyes-  Sclera clear, conjunctiva pink Ears- hearing intact Oropharynx- clear Neck- supple, no JVP Lymph- no cervical  lymphadenopathy Lungs- Clear to ausculation bilaterally, normal work of breathing Heart- Regular rate and rhythm, no murmurs, rubs or gallops, PMI not laterally displaced GI- soft, NT, ND, + BS Extremities- no clubbing, cyanosis, or edema MS- no significant deformity or atrophy Skin- no rash or lesion Psych- euthymic mood, full affect Neuro- strength and sensation are intact  EKG- NSR at 82 bpm, PR int 152 ms, qrs int 78 ms, qtc 453 ms Epic records reviewed    Assessment and Plan: 1. H/o of typical a flutter and afib with RVR  Successful cardioversion for afib with RVR 06/06/18 but ERAF Pt was admitted 06/11/18 for return of afib with RVR and she was loaded on amiodarone drip and spontaneously converted   Continue  Eliquis, CHA2DS2VASc score of 3   Continue metoprolol succinate 25 mg qd Continue with amiodarone 200 mg qd  Cmet recently checked 3/3 with normal liver function She is due physical labs with her PCP in May will need liver panel and tsh then I will see back in late June if labs were not checked at time of physical, I  will recheck then   Butch Penny C. Kristie Bracewell, [facility] [address] [place], [place] [address] [phone]

## 2018-07-20 ENCOUNTER — Telehealth: Payer: Self-pay | Admitting: Pharmacist

## 2018-07-20 ENCOUNTER — Inpatient Hospital Stay: Payer: Medicare Other | Admitting: Hematology and Oncology

## 2018-07-20 ENCOUNTER — Inpatient Hospital Stay: Payer: Medicare Other

## 2018-07-20 DIAGNOSIS — C50911 Malignant neoplasm of unspecified site of right female breast: Secondary | ICD-10-CM

## 2018-07-20 DIAGNOSIS — R530 Neoplastic (malignant) related fatigue: Secondary | ICD-10-CM

## 2018-07-20 DIAGNOSIS — C782 Secondary malignant neoplasm of pleura: Secondary | ICD-10-CM

## 2018-07-20 DIAGNOSIS — D709 Neutropenia, unspecified: Secondary | ICD-10-CM

## 2018-07-20 DIAGNOSIS — C50919 Malignant neoplasm of unspecified site of unspecified female breast: Secondary | ICD-10-CM | POA: Diagnosis not present

## 2018-07-20 DIAGNOSIS — C7951 Secondary malignant neoplasm of bone: Secondary | ICD-10-CM

## 2018-07-20 DIAGNOSIS — C787 Secondary malignant neoplasm of liver and intrahepatic bile duct: Principal | ICD-10-CM

## 2018-07-20 MED ORDER — EXEMESTANE 25 MG PO TABS: 25.0000 mg | ORAL_TABLET | Freq: Every day | ORAL | 3 refills | Status: DC

## 2018-07-20 MED ORDER — EVEROLIMUS 10 MG PO TABS: 10.0000 mg | ORAL_TABLET | Freq: Every day | ORAL | 3 refills | Status: DC

## 2018-07-20 MED ORDER — DEXAMETHASONE 0.5 MG/5ML PO SOLN: ORAL | 3 refills | Status: DC

## 2018-07-20 NOTE — Telephone Encounter (Signed)
Oral Oncology Pharmacist Encounter  Received new prescription for Afinitor (everolimus) for the treatment of metastatic, HR+, HER2- breast cancer in conjunction with exemestane, planned duration until disease progression or unacceptable toxicity.  Labs from 07/17/18 assessed, OK for treatment. BPs reviewed, most readings WNL, will continue to be monitored. Last lipid panel in Epic from 2010, will be monitorted periodically during treatment  Current medication list in Epic reviewed, DDI with Afinitor and amiodarone identified:  Category C interaction: Amiodarone is a known PGP inhibitor possibly leading increased Afinitor AUC and Cmax when medications are used concurrently although manufacturer provides no recommendations for dose reductions.  This will be discussed with MD.  Prescription has been e-scribed to the [facility] by MD for benefits analysis and approval.  Oral Oncology Clinic will continue to follow for insurance authorization, copayment issues, initial counseling and start date.  [name], PharmD, BCPS, BCOP  07/20/2018 11:13 AM Oral Oncology Clinic [phone]

## 2018-07-20 NOTE — Telephone Encounter (Signed)
Oral Chemotherapy Pharmacist Encounter   I spoke with patient in exam room for overview of: Afinitor (everolimus) for the treatment of metastatic, hormone-receptor positive breast cancer in conjunction with exemestane, planned duration until disease progression or unacceptable toxicity.   Counseled patient on administration, dosing, side effects, monitoring, drug-food interactions, safe handling, storage, and disposal.  Patient will take Afinitor 10mg  tablets, 1 tablet by mouth once daily, with water, without regard to food.  Patient understands to take Afinitor consistently with regards to food and at approximately the same time each day.  Patient knows to aviod grapefruit or grapefruit juice while on therapy with Afinitor.  Patient will take exemestane 25mg  tablets, 1 tablet by mouth once daily after a meal. Patient plans to take her Afinitor and exemestane after breakfast daily.  Afinitor and exemestane start date: TBD, pending medication acquisition, week of 07/23/18  Adverse effects include but are not limited to: mouth sores, GI upset, nausea, diarrhea, constipation, rash, increased blood sugars, decreased blood counts, increased blood pressure, and edema.   Dexamethasone mouthwash for the prevention of stomatitis has been e-scribed to local pharmacy.  We discussed appropriate use of mouthwash and duration of stomatitis prevention.  Reviewed with patient importance of keeping a medication schedule and plan for any missed doses.  Mrs. Brittany Porter voiced understanding and appreciation.   All questions answered. Medication reconciliation performed and medication/allergy list updated.  Insurance authorization has been submitted. Once approved, a test claim will be run at the pharmacy for copayment information.  Patient previously approved for copayment foundation grants with [employer] and [employer]. We will work with these foundations so the funds can be used to cover out of  pocket costs for Afinitor and exemestane.  Oral oncology patient advocate will work with patient the 1st part of the week of 07/23/18 to arrange for medication acquisition of the Afinitor, exemestane, and dexamethasone mouthwash from the [facility].  Patient knows to call the office with questions or concerns. Oral Oncology Clinic will continue to follow.  [name], PharmD, BCPS, BCOP  07/20/2018 11:24 AM Oral Oncology Clinic [phone]

## 2018-07-20 NOTE — Assessment & Plan Note (Addendum)
Metastatic breast cancer: recently progressive in liver and bone, also pleura. OnFaslodex + Ibrance beginning 03/31/2016  Toxicities with therapy: 1. Neutropenia: Mild does not require dose adjustment. 2. injection site discomfort very mild 3. Fatigue related to [employer] molecular testing: Androgen receptor positive, PDL 1 neg, MSI stable, NTRK1/2/3Neg, T MBB intermediate 9 mutations/Mb, ESR 1-, PI 3 CA negative, BRCA negative  CT CAP 07/18/2018: Progressive disease with enlargement of several masslike areas in the base of left hemithorax, increased number and size of hypovascular hepatic lesions (4.6 cm, 1.7 cm, 2.4 cm, 2.7 cm) and bone metastases (sclerotic lesions throughout axial and appendicular skeleton similar in number but increased size).  Bone scan 07/18/2018: Uptake at multiple levels proximal right humerus, sternum, right iliac bone, lateral left fourth or fifth rib, inferior right scapula She will be scheduled for monthly Faslodex and every 65-monthXgeva injections.  Radiology review: Based upon the progression of her disease, I discussed different treatment options including 1.  Exemestane with everolimus 2.  Chemotherapy with oral Xeloda versus IV chemotherapy.  After the discussion we decided to initiate treatment with exemestane and everolimus. We will discontinue Faslodex injections. She will continue with Xgeva injections for bone mets.  We will reassess her on 08/09/2018 to see how she is tolerating the treatment.

## 2018-07-23 ENCOUNTER — Telehealth: Payer: Self-pay

## 2018-07-23 NOTE — Telephone Encounter (Signed)
Oral Oncology Patient Advocate Encounter  Received notification from [employer] that prior authorization for Afinitor is required.  PA submitted on CoverMyMeds Key [id number] Status is pending  Oral Oncology Clinic will continue to follow.  [place] Patient [place] Phone [phone] Fax [phone] 07/23/2018    8:56 AM

## 2018-07-23 NOTE — Telephone Encounter (Signed)
Oral Oncology Patient Advocate Encounter  Prior Authorization for Afinitor has been approved.    PA# [id number] Effective dates: 07/20/18 through 05/16/19  Oral Oncology Clinic will continue to follow.   [place] Patient [place] Phone [phone] Fax [phone] 07/23/2018    8:57 AM

## 2018-07-24 MED FILL — AFINITOR 10 MG TABLET: 10 | 28 days supply | Qty: 28 | Fill #0

## 2018-07-24 MED FILL — DEXAMETHASONE 0.5 MG/5 ML L: 0.5 | 12 days supply | Qty: 500 | Fill #0

## 2018-07-24 MED FILL — EXEMESTANE 25 MG TABS: 25 | 30 days supply | Qty: 30 | Fill #0

## 2018-07-25 NOTE — Telephone Encounter (Signed)
Oral Oncology Patient Advocate Encounter  Confirmed with [place] that Afinitor, dex and exemestane was picked up on 07/24/18 all with $0 copays using [employer] grants.   [place] Patient [place] Phone [phone] Fax [phone] 07/25/2018   8:52 AM

## 2018-08-08 NOTE — Progress Notes (Signed)
Patient Care Team: Brittany Ada, MD as PCP - General (Family Medicine)  DIAGNOSIS:    ICD-10-CM   1. 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 BRCA reportedly normal.    08/25/2015 Relapse/Recurrence    new solitary liver lesion, biopsy showing metastatic ER PR + breast    09/25/2015 - 03/11/2016 Chemotherapy    Xeloda. Stopped due to progression of liver and bone metastases; pleural metastases    03/31/2016 -  Anti-estrogen oral therapy    Foslodex started 03/31/16, currently monthly  Ibrance 138m 3 weeks on, 1 week off started 03/31/16 Xgeva started on 05/05/16, currently every 3 months  Letrozole daily added 08/01/17     03/20/2018 Genetic Testing    Genetic testing performed through [facility]'s Multi-Cancer reported out on 03/19/2018 showed no pathogenic mutations. The Multi-Cancer Panel offered by [facility] includes sequencing and/or deletion duplication testing of the following 91 genes: AIP, ALK, APC, ATM, AXIN2, BAP1, BARD1, BLM, BMPR1A, BRCA1, BRCA2, BRIP1, BUB1B, CASR, CDC73, CDH1, CDK4, CDKN1B, CDKN1C, CDKN2A, CEBPA, CEP57, CHEK2, CTNNA1, DICER1, DIS3L2, EGFR, ENG, EPCAM, FH, FLCN, GALNT12, GATA2, GPC3, GREM1, HOXB13, HRAS, KIT, MAX, MEN1, MET, MITF, MLH1, MLH3, MSH2, MSH3, MSH6, MUTYH, NBN, NF1, NF2, NTHL1, PALB2, PDGFRA, PHOX2B, PMS2, POLD1, POLE, POT1, PRKAR1A, PTCH1, PTEN, RAD50, RAD51C,  RAD51D, RB1, RECQL4, RET, RNF43, RPS20, RUNX1, SDHA, SDHAF2, SDHB, SDHC, SDHD, SMAD4, SMARCA4, SMARCB1, SMARCE1, STK11, SUFU, TERC, TERT, TMEM127, TP53, TSC1, TSC2, VHL, WRN, WT1  A variant of uncertain significance (VUS) in a gene called MET was also noted. [id number] (p.Thr557Ala)    04/08/2018 Miscellaneous    [facility] molecular testing: Androgen receptor positive, PDL 1 neg, MSI stable, NTRK1/2/3Neg, T MBB intermediate 9 mutations/Mb, ESR 1-, PI 3 CA negative, BRCA negative     [place]   03/27/2016 Initial Diagnosis    Carcinoma of breast metastatic to bone ([place]    03/31/2016 -  Chemotherapy    Foslodex started 03/31/16, currently monthly  Ibrance 1251m3 weeks on, 1 week off started 03/31/16 Daily letrozole added 08/01/17      Breast cancer metastasized to liver, unspecified laterality [place]   03/27/2016 Initial Diagnosis    Breast cancer metastasized to liver, unspecified laterality ([place]    CHIEF COMPLIANT:  Metastatic breast cancer on exemestane with everolimus  INTERVAL HISTORY: [name] a [age].o. with above-mentioned history of metastatic breast cancer with metastases to the bone, liver, and lung who is currently on Ibrance with Faslodex. She presents to the clinic alone today. She reports several episodes of diarrhea, about once daily, and increased urination since starting exemestane with everolimus. Her blood sugar has been running high, for which her PCP has increased her dose of glimepiride to twice daily. She denies any mouth sores. Her labs from today show: WBC 4.0, Hg 11.1, platelets 148, ANC 2.4, and blood glucose 250.  REVIEW OF SYSTEMS:   Constitutional: Denies fevers, chills or abnormal weight loss Eyes: Denies blurriness of vision Ears, nose, mouth, throat, and face: Denies mucositis or sore throat Respiratory: Denies cough, dyspnea or wheezes Cardiovascular: Denies palpitation, chest discomfort  Gastrointestinal: Denies nausea, heartburn (+) diarrhea (+) inc. urination Skin: Denies abnormal skin rashes Lymphatics: Denies new lymphadenopathy or easy bruising Neurological: Denies numbness, tingling or new weaknesses Behavioral/Psych: Mood is stable, no new changes  Extremities: No lower extremity edema Breast: denies any pain or lumps or nodules in either breasts All other systems were reviewed with the patient and are negative.  I have reviewed the past medical history, past surgical history, social history and family history with the patient and they are unchanged from previous note.  ALLERGIES:  is allergic to prednisolone; prednisone; and sulfa antibiotics.  MEDICATIONS:  Current Outpatient Medications  Medication Sig Dispense Refill  . amiodarone (PACERONE) 200 MG tablet Take 1 tablet (200 mg total) by mouth daily. START by taking 200mg (1 tablet) twice daily, THEN reduce to 200mg (1 tablet) once daily 42 tablet 3  . Calcium Carb-Cholecalciferol (CALCIUM 600+D3) 600-800 MG-UNIT TABS Take 1 tablet by mouth daily.     . Cholecalciferol (VITAMIN D3) 2000 UNITS TABS Take 2,000 mg by mouth daily.    . dexamethasone (DECADRON) 0.5 MG/5ML solution Swish 10mL in mouth for 2min and spit out. Use 4 times daily for 8 weeks, start with Afinitor. Avoid eating/drinking for 1hr after rinse. 500 mL 3  . everolimus (AFINITOR) 10 MG tablet Take 1 tablet (10 mg total) by mouth daily. 30 tablet 3  . exemestane (AROMASIN) 25 MG tablet Take 1 tablet (25 mg total) by mouth daily after breakfast. 90 tablet 3  . ferrous sulfate 325 (65 FE) MG tablet Take 325 mg by mouth daily as needed (energy).     . fish oil-omega-3 fatty acids 1000 MG capsule Take 1 g by mouth 2 (two) times daily.     . furosemide (LASIX) 20 MG tablet Take 20 mg by mouth daily as needed for fluid or edema. Reported on 05/07/2015    . gabapentin (NEURONTIN) 300 MG capsule Take 1 capsule (300 mg total) by mouth 2 (two) times daily.     . glimepiride (AMARYL) 2 MG tablet Take 1 tablet (2 mg total) by mouth 2 (two) times daily.    . ibuprofen (ADVIL,MOTRIN) 200 MG tablet Take 400 mg by mouth every 6 (six) hours as needed for headache (pain). Reported on 11/12/2015    . Magnesium Oxide (MAG-OXIDE) 200 MG TABS Take 1 tablet (200 mg total) by mouth daily. (Patient taking differently: Take 200 mg by mouth at bedtime. ) 30 tablet 0  . metFORMIN (GLUCOPHAGE) 500 MG tablet Take 500 mg by mouth 2 (two) times daily with a meal.      . metoprolol succinate (TOPROL-XL) 25 MG 24 hr tablet Take 1 tablet (25 mg total) by mouth daily. 30 tablet 6  . naproxen sodium (ALEVE) 220 MG tablet Take 220 mg by mouth daily as needed (pain).    . rivaroxaban (XARELTO) 20 MG TABS tablet Take 20 mg by mouth daily with supper.    . simvastatin (ZOCOR) 40 MG tablet Take 40 mg by mouth at bedtime.       No current facility-administered medications for this visit.     PHYSICAL EXAMINATION: ECOG PERFORMANCE STATUS: 1 - Symptomatic but completely ambulatory  Vitals:   08/09/18 1132  BP: (!) 156/71  Pulse: 64    Resp: 17  Temp: 99.5 F (37.5 C)  SpO2: 100%   Filed Weights   08/09/18 1132  Weight: 206 lb 1.6 oz (93.5 kg)    GENERAL: alert, no distress and comfortable SKIN: skin color, texture, turgor are normal, no rashes or significant lesions EYES: normal, Conjunctiva are pink and non-injected, sclera clear OROPHARYNX: no exudate, no erythema and lips, buccal mucosa, and tongue normal  NECK: supple, thyroid normal size, non-tender, without nodularity LYMPH: no palpable lymphadenopathy in the cervical, axillary or inguinal LUNGS: clear to auscultation and percussion with normal breathing effort HEART: regular rate & rhythm and no murmurs and no lower extremity edema ABDOMEN: abdomen soft, non-tender and normal bowel sounds MUSCULOSKELETAL: no cyanosis of digits and no clubbing  NEURO: alert & oriented x 3 with fluent speech, no focal motor/sensory  deficits EXTREMITIES: No lower extremity edema  LABORATORY DATA:  I have reviewed the data as listed CMP Latest Ref Rng & Units 08/09/2018 07/17/2018 06/12/2018  Glucose 70 - 99 mg/dL 250(H) 138(H) 159(H)  BUN 8 - 23 mg/dL _0 Creatinine 0.44 - 1.00 mg/dL 0.83 0.90 0.74  Sodium 135 - 145 mmol/L 140 142 140  Potassium 3.5 - 5.1 mmol/L 3.8 4.9 4.4  Chloride 98 - 111 mmol/L 102 104 102  CO2 22 - 32 mmol/L _1 Calcium 8.9 - 10.3 mg/dL 7.7(L) 9.0 7.9(L)  Total Protein 6.5 - 8.1 g/dL 6.7 6.6 -  Total Bilirubin 0.3 - 1.2 mg/dL 0.7 1.0 -  Alkaline Phos 38 - 126 U/L 57 47 -  AST 15 - 41 U/L 27 20 -  ALT 0 - 44 U/L 16 9 -    Lab Results  Component Value Date   WBC 4.0 08/09/2018   HGB 11.1 (L) 08/09/2018   HCT 34.0 (L) 08/09/2018   MCV 106.6 (H) 08/09/2018   PLT 148 (L) 08/09/2018   NEUTROABS 2.4 08/09/2018    ASSESSMENT & PLAN:  Breast cancer metastasized to liver [facility]) Metastatic breast cancer: recently progressive in liver and bone, also pleura. OnFaslodex + Ibrance beginning 03/31/2016  Toxicities with therapy: 1. Neutropenia: Mild does not require dose adjustment. 2. injection site discomfort very mild 3. Fatigue related to [employer] molecular testing: Androgen receptor positive, PDL 1 neg, MSI stable, NTRK1/2/3Neg, T MBB intermediate 9 mutations/Mb, ESR 1-, PI 3 CA negative, BRCA negative  CT CAP 07/18/2018: Progressive disease with enlargement of several masslike areas in the base of left hemithorax, increased number and size of hypovascular hepatic lesions (4.6 cm, 1.7 cm, 2.4 cm, 2.7 cm) and bone metastases (sclerotic lesions throughout axial and appendicular skeleton similar in number but increased size).  Bone scan 07/18/2018: Uptake at multiple levels proximal right humerus, sternum, right iliac bone, lateral left fourth or fifth rib, inferior right scapula She will be scheduled for monthly Faslodex and every 66-monthXgeva injections.   Current  treatment: Exemestane with everolimus started 07/20/2018 Everolimus toxicities: Intermittent loose stools and frequent urination Elevated blood sugars: Her primary care physician is aware of her sugars and adjusting her medications.  I instructed her to be in close contact with her.  Return to clinic in 3 months with injection labs and follow-up. Our plan is to obtain scans in 6 months.    No orders of the defined types were placed in this encounter.  The patient has a good understanding of the overall plan. she agrees with it. she will call with any problems that may develop before the  next visit here.  , , MD 08/09/2018  I, Molly Dorshimer am acting as scribe for Dr.  .  I have reviewed the above documentation for accuracy and completeness, and I agree with the above.    

## 2018-08-09 ENCOUNTER — Inpatient Hospital Stay: Payer: Medicare Other

## 2018-08-09 ENCOUNTER — Other Ambulatory Visit: Payer: Self-pay | Admitting: *Deleted

## 2018-08-09 ENCOUNTER — Ambulatory Visit: Payer: Medicare Other

## 2018-08-09 ENCOUNTER — Other Ambulatory Visit: Payer: Self-pay

## 2018-08-09 ENCOUNTER — Inpatient Hospital Stay (HOSPITAL_BASED_OUTPATIENT_CLINIC_OR_DEPARTMENT_OTHER): Payer: Medicare Other | Admitting: Hematology and Oncology

## 2018-08-09 ENCOUNTER — Telehealth: Payer: Self-pay | Admitting: *Deleted

## 2018-08-09 DIAGNOSIS — R197 Diarrhea, unspecified: Secondary | ICD-10-CM

## 2018-08-09 DIAGNOSIS — C787 Secondary malignant neoplasm of liver and intrahepatic bile duct: Secondary | ICD-10-CM | POA: Diagnosis not present

## 2018-08-09 DIAGNOSIS — C50919 Malignant neoplasm of unspecified site of unspecified female breast: Secondary | ICD-10-CM | POA: Diagnosis not present

## 2018-08-09 DIAGNOSIS — C782 Secondary malignant neoplasm of pleura: Secondary | ICD-10-CM | POA: Diagnosis not present

## 2018-08-09 DIAGNOSIS — C7951 Secondary malignant neoplasm of bone: Secondary | ICD-10-CM

## 2018-08-09 DIAGNOSIS — C50911 Malignant neoplasm of unspecified site of right female breast: Secondary | ICD-10-CM

## 2018-08-09 DIAGNOSIS — R350 Frequency of micturition: Secondary | ICD-10-CM

## 2018-08-09 LAB — LIPID PANEL
Cholesterol: 167 mg/dL (ref 0–200)
HDL: 61 mg/dL (ref >40)
LDL Cholesterol: 74 mg/dL (ref 0–99)
Total CHOL/HDL Ratio: 2.7 RATIO
Triglycerides: 161 mg/dL — ABNORMAL HIGH (ref <150)
VLDL: 32 mg/dL (ref 0–40)

## 2018-08-09 LAB — CBC WITH DIFFERENTIAL (CANCER CENTER ONLY)
Abs Immature Granulocytes: 0.01 10*3/uL (ref 0.00–0.07)
Basophils Absolute: 0.1 10*3/uL (ref 0.0–0.1)
Basophils Relative: 1 %
Eosinophils Absolute: 0.1 10*3/uL (ref 0.0–0.5)
Eosinophils Relative: 1 %
HCT: 34 % — ABNORMAL LOW (ref 36.0–46.0)
Hemoglobin: 11.1 g/dL — ABNORMAL LOW (ref 12.0–15.0)
Immature Granulocytes: 0 %
Lymphocytes Relative: 26 %
Lymphs Abs: 1 10*3/uL (ref 0.7–4.0)
MCH: 34.8 pg — ABNORMAL HIGH (ref 26.0–34.0)
MCHC: 32.6 g/dL (ref 30.0–36.0)
MCV: 106.6 fL — ABNORMAL HIGH (ref 80.0–100.0)
Monocytes Absolute: 0.5 10*3/uL (ref 0.1–1.0)
Monocytes Relative: 12 %
Neutro Abs: 2.4 10*3/uL (ref 1.7–7.7)
Neutrophils Relative %: 60 %
Platelet Count: 148 10*3/uL — ABNORMAL LOW (ref 150–400)
RBC: 3.19 MIL/uL — ABNORMAL LOW (ref 3.87–5.11)
RDW: 15.9 % — ABNORMAL HIGH (ref 11.5–15.5)
WBC Count: 4 10*3/uL (ref 4.0–10.5)
nRBC: 0 % (ref 0.0–0.2)

## 2018-08-09 LAB — CMP (CANCER CENTER ONLY)
ALT: 16 U/L (ref 0–44)
AST: 27 U/L (ref 15–41)
Albumin: 3.2 g/dL — ABNORMAL LOW (ref 3.5–5.0)
Alkaline Phosphatase: 57 U/L (ref 38–126)
Anion gap: 10 (ref 5–15)
BUN: 11 mg/dL (ref 8–23)
CO2: 28 mmol/L (ref 22–32)
Calcium: 7.7 mg/dL — ABNORMAL LOW (ref 8.9–10.3)
Chloride: 102 mmol/L (ref 98–111)
Creatinine: 0.83 mg/dL (ref 0.44–1.00)
GFR, Est AFR Am: >60 mL/min (ref >60)
GFR, Estimated: >60 mL/min (ref >60)
Glucose, Bld: 250 mg/dL — ABNORMAL HIGH (ref 70–99)
Potassium: 3.8 mmol/L (ref 3.5–5.1)
Sodium: 140 mmol/L (ref 135–145)
Total Bilirubin: 0.7 mg/dL (ref 0.3–1.2)
Total Protein: 6.7 g/dL (ref 6.5–8.1)

## 2018-08-09 MED ORDER — GLIMEPIRIDE 2 MG PO TABS: 2.0000 mg | ORAL_TABLET | Freq: Two times a day (BID) | ORAL | Status: DC

## 2018-08-09 NOTE — Progress Notes (Signed)
Pt calcium 7.7 today no injection at this time copy of labs were given to pt

## 2018-08-09 NOTE — Telephone Encounter (Signed)
Pt calcium 7.7.  Per Dr. Lindi Adie hold xgeva injection today which was related to [facility] LPN in injection room.  I tried to call pt and left her a voice mail stating that Dr. Lindi Adie wants her to take Calcium Carbonate 600 mg twice a day and that she could pick that medication up over the counter at any pharmacy or grocery store.  I left the pt my number to call me back if she has any questions or concerns.

## 2018-08-09 NOTE — Assessment & Plan Note (Signed)
Metastatic breast cancer: recently progressive in liver and bone, also pleura. OnFaslodex + Ibrance beginning 03/31/2016  Toxicities with therapy: 1. Neutropenia: Mild does not require dose adjustment. 2. injection site discomfort very mild 3. Fatigue related to [employer] molecular testing: Androgen receptor positive, PDL 1 neg, MSI stable, NTRK1/2/3Neg, T MBB intermediate 9 mutations/Mb, ESR 1-, PI 3 CA negative, BRCA negative  CT CAP 07/18/2018: Progressive disease with enlargement of several masslike areas in the base of left hemithorax, increased number and size of hypovascular hepatic lesions (4.6 cm, 1.7 cm, 2.4 cm, 2.7 cm) and bone metastases (sclerotic lesions throughout axial and appendicular skeleton similar in number but increased size).  Bone scan 07/18/2018: Uptake at multiple levels proximal right humerus, sternum, right iliac bone, lateral left fourth or fifth rib, inferior right scapula She will be scheduled for monthly Faslodex and every 66-monthXgeva injections.   Current treatment: Exemestane with everolimus started 07/20/2018 Everolimus toxicities: Intermittent loose stools and frequent urination Elevated blood sugars: Her primary care physician is aware of her sugars and adjusting her medications.  I instructed her to be in close contact with her.  Return to clinic in 3 months with injection labs and follow-up. Our plan is to obtain scans in 6 months.

## 2018-08-11 MED FILL — DEXAMETHASONE 0.5 MG/5 ML L: 0.5 | 12 days supply | Qty: 500 | Fill #1

## 2018-08-11 MED FILL — AFINITOR 10 MG TABLET: 10 | 28 days supply | Qty: 28 | Fill #1

## 2018-08-11 MED FILL — EXEMESTANE 25 MG TABS: 25 | 30 days supply | Qty: 30 | Fill #1

## 2018-08-15 ENCOUNTER — Telehealth: Payer: Self-pay

## 2018-08-15 NOTE — Telephone Encounter (Signed)
Pharmacy notified nurse that patient did not receive Faslodex on 08/09/2018.  Per patient she did not think she was to be receiving Faslodex any longer.    Nurse reviewed with MD.  Per Dr. Lindi Adie patient to still receive Faslodex.  Nurse informed patient, she voiced agreement and understanding. Scheduling message will be sent for injection appointment.

## 2018-08-17 ENCOUNTER — Telehealth: Payer: Self-pay | Admitting: Hematology and Oncology

## 2018-08-17 NOTE — Telephone Encounter (Signed)
Left message per 4/1 sch message for patient to call back to set up monthly injections.

## 2018-08-20 ENCOUNTER — Telehealth: Payer: Self-pay | Admitting: Hematology and Oncology

## 2018-08-20 NOTE — Telephone Encounter (Signed)
Scheduled appts for monthly inj. Patient aware of appt dates and time

## 2018-08-22 ENCOUNTER — Inpatient Hospital Stay: Payer: Medicare Other | Attending: Hematology and Oncology

## 2018-08-22 ENCOUNTER — Other Ambulatory Visit: Payer: Self-pay

## 2018-08-22 DIAGNOSIS — Z8543 Personal history of malignant neoplasm of ovary: Secondary | ICD-10-CM | POA: Insufficient documentation

## 2018-08-22 DIAGNOSIS — I4891 Unspecified atrial fibrillation: Secondary | ICD-10-CM | POA: Diagnosis not present

## 2018-08-22 DIAGNOSIS — M6281 Muscle weakness (generalized): Secondary | ICD-10-CM | POA: Insufficient documentation

## 2018-08-22 DIAGNOSIS — N3000 Acute cystitis without hematuria: Secondary | ICD-10-CM | POA: Diagnosis not present

## 2018-08-22 DIAGNOSIS — Z7901 Long term (current) use of anticoagulants: Secondary | ICD-10-CM | POA: Insufficient documentation

## 2018-08-22 DIAGNOSIS — R197 Diarrhea, unspecified: Secondary | ICD-10-CM | POA: Diagnosis not present

## 2018-08-22 DIAGNOSIS — C50919 Malignant neoplasm of unspecified site of unspecified female breast: Secondary | ICD-10-CM

## 2018-08-22 DIAGNOSIS — Z8542 Personal history of malignant neoplasm of other parts of uterus: Secondary | ICD-10-CM | POA: Insufficient documentation

## 2018-08-22 DIAGNOSIS — Z853 Personal history of malignant neoplasm of breast: Secondary | ICD-10-CM | POA: Diagnosis not present

## 2018-08-22 DIAGNOSIS — E1165 Type 2 diabetes mellitus with hyperglycemia: Secondary | ICD-10-CM | POA: Diagnosis not present

## 2018-08-22 DIAGNOSIS — R05 Cough: Secondary | ICD-10-CM | POA: Diagnosis not present

## 2018-08-22 DIAGNOSIS — Z85828 Personal history of other malignant neoplasm of skin: Secondary | ICD-10-CM | POA: Insufficient documentation

## 2018-08-22 DIAGNOSIS — Z79899 Other long term (current) drug therapy: Secondary | ICD-10-CM | POA: Diagnosis not present

## 2018-08-22 DIAGNOSIS — R0602 Shortness of breath: Secondary | ICD-10-CM | POA: Insufficient documentation

## 2018-08-22 DIAGNOSIS — Z7984 Long term (current) use of oral hypoglycemic drugs: Secondary | ICD-10-CM | POA: Insufficient documentation

## 2018-08-22 DIAGNOSIS — C7951 Secondary malignant neoplasm of bone: Secondary | ICD-10-CM

## 2018-08-22 MED ORDER — FULVESTRANT 250 MG/5ML IM SOLN
500.0000 mg | Freq: Once | INTRAMUSCULAR | Status: AC
  Administered 2018-08-22: 500 mg via INTRAMUSCULAR

## 2018-08-22 MED ORDER — FULVESTRANT 250 MG/5ML IM SOLN
INTRAMUSCULAR | Status: AC
  Filled 2018-08-22: qty 10

## 2018-08-22 NOTE — Patient Instructions (Signed)

## 2018-08-24 ENCOUNTER — Emergency Department (HOSPITAL_COMMUNITY)
Admission: EM | Admit: 2018-08-24 | Discharge: 2018-08-24 | Disposition: A | Discharge status: Home / Self Care | Payer: Medicare Other | Attending: Emergency Medicine | Admitting: Emergency Medicine

## 2018-08-24 ENCOUNTER — Other Ambulatory Visit: Payer: Self-pay

## 2018-08-24 ENCOUNTER — Inpatient Hospital Stay (HOSPITAL_BASED_OUTPATIENT_CLINIC_OR_DEPARTMENT_OTHER): Payer: Medicare Other | Admitting: Medical

## 2018-08-24 ENCOUNTER — Encounter (HOSPITAL_COMMUNITY): Payer: Self-pay | Admitting: Emergency Medicine

## 2018-08-24 ENCOUNTER — Telehealth: Payer: Self-pay | Admitting: Emergency Medicine

## 2018-08-24 ENCOUNTER — Ambulatory Visit (HOSPITAL_COMMUNITY)
Admission: EM | Admit: 2018-08-24 | Discharge: 2018-08-24 | Disposition: A | Discharge status: Home / Self Care | Payer: Medicare Other | Source: Home / Self Care

## 2018-08-24 ENCOUNTER — Emergency Department (HOSPITAL_COMMUNITY): Payer: Medicare Other

## 2018-08-24 ENCOUNTER — Telehealth: Payer: Self-pay | Admitting: Pharmacist

## 2018-08-24 DIAGNOSIS — R197 Diarrhea, unspecified: Secondary | ICD-10-CM | POA: Diagnosis not present

## 2018-08-24 DIAGNOSIS — R0602 Shortness of breath: Secondary | ICD-10-CM

## 2018-08-24 DIAGNOSIS — C50911 Malignant neoplasm of unspecified site of right female breast: Secondary | ICD-10-CM

## 2018-08-24 DIAGNOSIS — T887XXA Unspecified adverse effect of drug or medicament, initial encounter: Secondary | ICD-10-CM

## 2018-08-24 DIAGNOSIS — N3000 Acute cystitis without hematuria: Secondary | ICD-10-CM

## 2018-08-24 DIAGNOSIS — C782 Secondary malignant neoplasm of pleura: Secondary | ICD-10-CM

## 2018-08-24 DIAGNOSIS — R059 Cough, unspecified: Secondary | ICD-10-CM

## 2018-08-24 DIAGNOSIS — C50919 Malignant neoplasm of unspecified site of unspecified female breast: Secondary | ICD-10-CM

## 2018-08-24 DIAGNOSIS — E1165 Type 2 diabetes mellitus with hyperglycemia: Secondary | ICD-10-CM

## 2018-08-24 DIAGNOSIS — T451X5A Adverse effect of antineoplastic and immunosuppressive drugs, initial encounter: Secondary | ICD-10-CM

## 2018-08-24 DIAGNOSIS — C55 Malignant neoplasm of uterus, part unspecified: Secondary | ICD-10-CM

## 2018-08-24 DIAGNOSIS — R739 Hyperglycemia, unspecified: Secondary | ICD-10-CM

## 2018-08-24 DIAGNOSIS — C787 Secondary malignant neoplasm of liver and intrahepatic bile duct: Secondary | ICD-10-CM

## 2018-08-24 DIAGNOSIS — J910 Malignant pleural effusion: Secondary | ICD-10-CM

## 2018-08-24 DIAGNOSIS — R05 Cough: Secondary | ICD-10-CM

## 2018-08-24 DIAGNOSIS — J3489 Other specified disorders of nose and nasal sinuses: Secondary | ICD-10-CM

## 2018-08-24 DIAGNOSIS — C569 Malignant neoplasm of unspecified ovary: Secondary | ICD-10-CM

## 2018-08-24 DIAGNOSIS — C4359 Malignant melanoma of other part of trunk: Secondary | ICD-10-CM

## 2018-08-24 LAB — TROPONIN I
Troponin I: 0.03 ng/mL (ref <0.03)
Troponin I: 0.04 ng/mL (ref <0.03)

## 2018-08-24 LAB — URINALYSIS, ROUTINE W REFLEX MICROSCOPIC
Bilirubin Urine: NEGATIVE
Glucose, UA: >=500 mg/dL — AB
Ketones, ur: 5 mg/dL — AB
Nitrite: NEGATIVE
Protein, ur: NEGATIVE mg/dL
Specific Gravity, Urine: 1.02 (ref 1.005–1.030)
WBC, UA: >50 WBC/hpf — ABNORMAL HIGH (ref 0–5)
pH: 6 (ref 5.0–8.0)

## 2018-08-24 LAB — CBG MONITORING, ED
Glucose-Capillary: 549 mg/dL (ref 70–99)
Glucose-Capillary: 394 mg/dL — ABNORMAL HIGH (ref 70–99)

## 2018-08-24 LAB — BASIC METABOLIC PANEL
Anion gap: 15 (ref 5–15)
BUN: 13 mg/dL (ref 8–23)
CO2: 22 mmol/L (ref 22–32)
Calcium: 8.3 mg/dL — ABNORMAL LOW (ref 8.9–10.3)
Chloride: 98 mmol/L (ref 98–111)
Creatinine, Ser: 1.2 mg/dL — ABNORMAL HIGH (ref 0.44–1.00)
GFR calc Af Amer: 51 mL/min — ABNORMAL LOW (ref >60)
GFR calc non Af Amer: 44 mL/min — ABNORMAL LOW (ref >60)
Glucose, Bld: 551 mg/dL (ref 70–99)
Potassium: 4.1 mmol/L (ref 3.5–5.1)
Sodium: 135 mmol/L (ref 135–145)

## 2018-08-24 LAB — CBC WITH DIFFERENTIAL/PLATELET
Abs Immature Granulocytes: 0.04 10*3/uL (ref 0.00–0.07)
Basophils Absolute: 0 10*3/uL (ref 0.0–0.1)
Basophils Relative: 1 %
Eosinophils Absolute: 0.1 10*3/uL (ref 0.0–0.5)
Eosinophils Relative: 1 %
HCT: 35.7 % — ABNORMAL LOW (ref 36.0–46.0)
Hemoglobin: 11.6 g/dL — ABNORMAL LOW (ref 12.0–15.0)
Immature Granulocytes: 1 %
Lymphocytes Relative: 16 %
Lymphs Abs: 0.9 10*3/uL (ref 0.7–4.0)
MCH: 32.1 pg (ref 26.0–34.0)
MCHC: 32.5 g/dL (ref 30.0–36.0)
MCV: 98.9 fL (ref 80.0–100.0)
Monocytes Absolute: 0.8 10*3/uL (ref 0.1–1.0)
Monocytes Relative: 13 %
Neutro Abs: 4.1 10*3/uL (ref 1.7–7.7)
Neutrophils Relative %: 68 %
Platelets: 147 10*3/uL — ABNORMAL LOW (ref 150–400)
RBC: 3.61 MIL/uL — ABNORMAL LOW (ref 3.87–5.11)
RDW: 18.6 % — ABNORMAL HIGH (ref 11.5–15.5)
WBC: 6 10*3/uL (ref 4.0–10.5)
nRBC: 0.3 % — ABNORMAL HIGH (ref 0.0–0.2)

## 2018-08-24 LAB — GLUCOSE, CAPILLARY: Glucose-Capillary: 516 mg/dL (ref 70–99)

## 2018-08-24 LAB — BRAIN NATRIURETIC PEPTIDE: B Natriuretic Peptide: 76.7 pg/mL (ref 0.0–100.0)

## 2018-08-24 MED ORDER — CEPHALEXIN 500 MG PO CAPS: 500.0000 mg | ORAL_CAPSULE | Freq: Four times a day (QID) | ORAL | 0 refills | Status: AC

## 2018-08-24 MED ORDER — METFORMIN HCL 500 MG PO TABS: 1000.0000 mg | ORAL_TABLET | Freq: Two times a day (BID) | ORAL | 0 refills | Status: DC

## 2018-08-24 MED ORDER — LACTATED RINGERS IV BOLUS
1000.0000 mL | Freq: Once | INTRAVENOUS | Status: AC
  Administered 2018-08-24: 14:00:00 1000 mL via INTRAVENOUS

## 2018-08-24 MED ORDER — CEPHALEXIN 250 MG PO CAPS
500.0000 mg | ORAL_CAPSULE | Freq: Once | ORAL | Status: AC
  Administered 2018-08-24: 500 mg via ORAL
  Filled 2018-08-24: qty 2

## 2018-08-24 NOTE — Discharge Instructions (Signed)
You have been seen in the Emergency Department (ED) today for pain when urinating.  Your workup today suggests that you have a urinary tract infection (UTI).  Please take your antibiotic as prescribed and over-the-counter pain medication (Tylenol) as needed, but no more than recommended on the label instructions.  Drink PLENTY of fluids.  Call your regular doctor to schedule the next available appointment to follow up on todays ED visit, or return immediately to the ED if your pain worsens, you have decreased urine production, develop fever, persistent vomiting, chest pain or other symptoms that concern you.

## 2018-08-24 NOTE — ED Provider Notes (Addendum)
[place]   [id number] 08/24/18 Arrival Time: 1022  Cc: Medication side effects  SUBJECTIVE:  Brittany Porter is a 73 y.o. female hx significant for metastatic breast cancer, DM, and HLD, who presents with diarrhea, cough, SOB, and elevated glucose for the past month.  Attributes symptoms to chemo medication she is taking. States cough is productive with clear sputum.  Denies sick exposure to COVID, flu or strep.  Denies recent travel.   Checked blood sugar this morning and was 409.  Had a breakfast biscuit this morning.  Is on glimepiride and metformin for DM.  Complains of rhinorrhea, and dry throat.  Denies fever, chills, fatigue, sinus pain, sore throat, wheezing, chest pain, nausea, vomiting, abdominal pain, changes in bowel or bladder habits.    Patient was sent over by oncology office for further evaluation of respiratory symptoms.  Receives afinitor infusions.  Patient states respiratory symptoms are consistent with symptoms she has had since beginning her chemo medication.    ROS: As per HPI.  Past Medical History:  Diagnosis Date  . Arthritis   . Breast cancer ([place])   . Diabetes mellitus   . Endometrial carcinoma ([place]) 10/2008  . Family history of breast cancer   . Family history of colon cancer   . Family history of skin cancer   . Fluid overload   . History of radiation therapy 04/13/2009, 04/16/2009, 04/27/2009, 05/07/2009, 05/18/2009   3000 cGy to proximal vagina  . Hyperlipidemia   . Iron deficiency   . Melanoma ([place]) 2010   rt arm and back  . Ovarian cancer ([place])   . Pleural effusion 04/2010   Past Surgical History:  Procedure Laterality Date  . ABDOMINAL HYSTERECTOMY  10/2008  . INSERTION OF MESH  05/28/2012   Procedure: INSERTION OF MESH;  Surgeon: Adin Hector, MD;  Location: [facility];  Service: General;  Laterality: N/A;  . KNEE ARTHROSCOPY  04/2010   Right knee  . LAPAROSCOPIC LYSIS OF ADHESIONS  05/28/2012   Procedure: LAPAROSCOPIC LYSIS OF  ADHESIONS;  Surgeon: Adin Hector, MD;  Location: [facility];  Service: General;  Laterality: N/A;  . MASTECTOMY  1997   Bilateral with lymph nodes  . Melanoma removal  02/2009, 07/2009  . VENTRAL HERNIA REPAIR  05/28/2012   Procedure: LAPAROSCOPIC VENTRAL HERNIA;  Surgeon: Adin Hector, MD;  Location: [facility];  Service: General;  Laterality: N/A;   Allergies  Allergen Reactions  . Prednisolone Shortness Of Breath and Other (See Comments)     increased BP  . Prednisone Anaphylaxis  . Sulfa Antibiotics Swelling    "Eyes swelled shut" - reaction to eye drops   No current facility-administered medications on file prior to encounter.    Current Outpatient Medications on File Prior to Encounter  Medication Sig Dispense Refill  . amiodarone (PACERONE) 200 MG tablet Take 1 tablet (200 mg total) by mouth daily. START by taking 294m (1 tablet) twice daily, THEN reduce to 2067m(1 tablet) once daily 42 tablet 3  . Calcium Carb-Cholecalciferol (CALCIUM 600+D3) 600-800 MG-UNIT TABS Take 1 tablet by mouth daily.     . Cholecalciferol (VITAMIN D3) 2000 UNITS TABS Take 2,000 mg by mouth daily.    . Marland Kitchenexamethasone (DECADRON) 0.5 MG/5ML solution Swish 1059mn mouth for 2mi27mnd spit out. Use 4 times daily for 8 weeks, start with Afinitor. Avoid eating/drinking for 1hr after rinse. 500 mL 3  . everolimus (AFINITOR) 10 MG tablet Take 1 tablet (10 mg total)  by mouth daily. 30 tablet 3  . exemestane (AROMASIN) 25 MG tablet Take 1 tablet (25 mg total) by mouth daily after breakfast. 90 tablet 3  . ferrous sulfate 325 (65 FE) MG tablet Take 325 mg by mouth daily as needed (energy).     . fish oil-omega-3 fatty acids 1000 MG capsule Take 1 g by mouth 2 (two) times daily.     . furosemide (LASIX) 20 MG tablet Take 20 mg by mouth daily as needed for fluid or edema. Reported on 05/07/2015    . gabapentin (NEURONTIN) 300 MG capsule Take 1 capsule (300 mg total) by mouth 2 (two) times daily.    Marland Kitchen glimepiride (AMARYL)  2 MG tablet Take 1 tablet (2 mg total) by mouth 2 (two) times daily.    Marland Kitchen ibuprofen (ADVIL,MOTRIN) 200 MG tablet Take 400 mg by mouth every 6 (six) hours as needed for headache (pain). Reported on 11/12/2015    . Magnesium Oxide (MAG-OXIDE) 200 MG TABS Take 1 tablet (200 mg total) by mouth daily. (Patient taking differently: Take 200 mg by mouth at bedtime. ) 30 tablet 0  . metFORMIN (GLUCOPHAGE) 500 MG tablet Take 500 mg by mouth 2 (two) times daily with a meal.      . metoprolol succinate (TOPROL-XL) 25 MG 24 hr tablet Take 1 tablet (25 mg total) by mouth daily. 30 tablet 6  . naproxen sodium (ALEVE) 220 MG tablet Take 220 mg by mouth daily as needed (pain).    Marland Kitchen OVER THE COUNTER MEDICATION Take 600 mg by mouth 2 (two) times daily before a meal. Calcium Carbonate 600 mg twice a day    . rivaroxaban (XARELTO) 20 MG TABS tablet Take 20 mg by mouth daily with supper.    . simvastatin (ZOCOR) 40 MG tablet Take 40 mg by mouth at bedtime.        Social History   Socioeconomic History  . Marital status: Married    Spouse name: Not on file  . Number of children: Not on file  . Years of education: Not on file  . Highest education level: Not on file  Occupational History  . Not on file  Social Needs  . Financial resource strain: Not on file  . Food insecurity:    Worry: Not on file    Inability: Not on file  . Transportation needs:    Medical: Not on file    Non-medical: Not on file  Tobacco Use  . Smoking status: Never Smoker  . Smokeless tobacco: Never Used  Substance and Sexual Activity  . Alcohol use: No  . Drug use: No  . Sexual activity: Yes  Lifestyle  . Physical activity:    Days per week: Not on file    Minutes per session: Not on file  . Stress: Not on file  Relationships  . Social connections:    Talks on phone: Not on file    Gets together: Not on file    Attends religious service: Not on file    Active member of club or organization: Not on file    Attends meetings  of clubs or organizations: Not on file    Relationship status: Not on file  . Intimate partner violence:    Fear of current or ex partner: Not on file    Emotionally abused: Not on file    Physically abused: Not on file    Forced sexual activity: Not on file  Other Topics Concern  . Not  on file  Social History Narrative  . Not on file   Family History  Problem Relation Age of Onset  . Stroke Mother   . Cancer Father        'voice box'  . Colon cancer Sister        dx >50  . Heart attack Brother   . Skin cancer Brother 46  . Cancer Sister        primary site unk, dx >50  . Stroke Brother   . Stroke Sister   . Colon cancer Sister 5  . Dementia Sister   . Breast cancer Sister        dx >5-, met to brain     OBJECTIVE:  Vitals:   08/24/18 1111 08/24/18 1114  BP:  (!) 133/48  Pulse: 80   Resp: 16   Temp: 98.5 F (36.9 C)   SpO2: 94%      General appearance: Alert, well-appearing; speaking in full sentences without difficulty HEENT:NCAT; Ears: EACs clear, TMs pearly gray; Eyes: PERRL.  EOM grossly intact. Nose: nares patent without rhinorrhea, nares mildly erythematous; Throat: tonsils nonerythematous or enlarged, uvula midline, teeth absent Neck: supple without LAD Lungs: clear to auscultation bilaterally without adventitious breath sounds; normal respiratory effort; no cough present; take deep breaths intermittently at conclusion of sentences, but otherwise no respiratory distress Heart: regular rate and rhythm.  Radial pulses 2+ symmetrical bilaterally; no peripheral edema Abdomen: protuberant; NTTP; normal active bowel sounds; limited with patient sitting in exam chair Skin: warm and dry Psychological: alert and cooperative; normal mood and affect  Results for orders placed or performed during the hospital encounter of 08/24/18 (from the past 24 hour(s))  Glucose, capillary     Status: Abnormal   Collection Time: 08/24/18 12:19 PM  Result Value Ref Range    Glucose-Capillary 516 (HH) 70 - 99 mg/dL   Comment 1 Call MD NNP PA CNM      ASSESSMENT & PLAN:  1. Metastatic breast cancer ([place])   2. Medication side effect   3. Diarrhea, unspecified type   4. Shortness of breath   5. Type 2 diabetes mellitus with hyperglycemia, without long-term current use of insulin (HCC)     No orders of the defined types were placed in this encounter.   Orders Placed This Encounter  Procedures  . Glucose, capillary    Standing Status:   Standing    Number of Occurrences:   1  . POC CBG monitoring    Standing Status:   Standing    Number of Occurrences:   1     Recommending further evaluation and management of hyperglycemia in the ED.  Blood glucose of 516 in office.  Hx significant for metastatic breast cancer, receiving afinitor infusions.  Informed patient, and instructed to go to the ED with husband by private vehicle.            Lestine Box, PA-C 08/24/18 [place], [place], [place] 08/24/18 1348

## 2018-08-24 NOTE — ED Notes (Signed)
Ambulating with min (A) to BR.  No dizziness or dyspnea noted.  Voids without diff,.

## 2018-08-24 NOTE — ED Provider Notes (Signed)
Blood pressure 129/89, pulse 78, temperature 98.3 F (36.8 C), temperature source Oral, resp. rate 17, SpO2 100 %.  Assuming care from Dr. Regenia Skeeter.  In short, Brittany Porter is a 73 y.o. female with a chief complaint of Hyperglycemia .  Refer to the original H&P for additional details.  The current plan of care is to f/u on repeat troponin. Patient CBG is down trending. No concern clinically for COVID. Plan to increase her home Metformin and advise close f/u with PCP regarding outpatient CBG mgmt. May be elevated temporarily in the setting of UTI. No DKA.   05:40 PM  Patient's troponin is essentially unchanged.  She is not having any chest pain, shortness of breath, other anginal equivalents.  She does have evidence of a UTI and her blood sugar is downtrending with IV fluids.  Patient received Rocephin.  I reviewed her EKG from earlier.  Discussed her symptoms and patient states she is feeling very well and would like to be discharged.  Wrote prescription for Keflex and increased her metformin dose.  She will call her PCP on Monday discussed these changes.  Discussed ED return precautions in detail.    EKG Interpretation  Date/Time:  Friday August 24 2018 13:30:56 EDT Ventricular Rate:  85 PR Interval:    QRS Duration: 94 QT Interval:  361 QTC Calculation: 430 R Axis:   61 Text Interpretation:  Sinus rhythm Ventricular premature complex Left ventricular hypertrophy Nonspecific T abnormalities, inferior leads pvcs and poor data tracing limit interpretation Confirmed by Sherwood Gambler [phone]) on 08/24/2018 2:01:03 PM          Long, Wonda Olds, MD 08/24/18 1743

## 2018-08-24 NOTE — ED Triage Notes (Signed)
Pt states that she was sent her from UC for high blood sugar due to some new chemo meds that she has recently started

## 2018-08-24 NOTE — ED Notes (Signed)
Patient says she needs to drink more water in order to pee. Pt given water per EDP.

## 2018-08-24 NOTE — ED Provider Notes (Signed)
[facility] EMERGENCY DEPARTMENT Provider Note   CSN: [id number] Arrival date & time: 08/24/18  1244    History   Chief Complaint Chief Complaint  Patient presents with  . Hyperglycemia    HPI Brittany Porter is a 73 y.o. female.     HPI  73 year old female presents with hyperglycemia, generalized weakness and overall not feeling well.  The hyperglycemia has been for about a month since she was placed on Afinitor.  She is also had a little bit of diarrhea, about 2 episodes per day without blood.  The weakness is generalized.  There is no chest pain, fever.  She has had a little bit of a cough with some mild sputum for about 2 weeks, does not occur every day.  Some on and off shortness of breath, often when walking.  However the shortness of breath is not bad.  She was sent here from urgent care.  She has been taking her metformin and glimepiride and has used some leftover insulin she had from when she had hyperglycemia from prednisone but the glucose is still out of control.  Often between 306 100.  Past Medical History:  Diagnosis Date  . Arthritis   . Breast cancer ([place])   . Diabetes mellitus   . Endometrial carcinoma ([place]) 10/2008  . Family history of breast cancer   . Family history of colon cancer   . Family history of skin cancer   . Fluid overload   . History of radiation therapy 04/13/2009, 04/16/2009, 04/27/2009, 05/07/2009, 05/18/2009   3000 cGy to proximal vagina  . Hyperlipidemia   . Iron deficiency   . Melanoma ([place]) 2010   rt arm and back  . Ovarian cancer ([place])   . Pleural effusion 04/2010    Patient Active Problem List   Diagnosis Date Noted  . Atrial fibrillation and flutter ([place]) 06/11/2018  . Genetic testing 03/21/2018  . Family history of breast cancer   . Family history of colon cancer   . Family history of skin cancer   . 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 Breast cancer metastasized to multiple sites,  unspecified laterality ([place]) 03/27/2016  . History of ovarian cancer 03/27/2016  . History of endometrial cancer 03/27/2016  . History of melanoma 03/27/2016  . Poor venous access 03/27/2016  . Encounter for antineoplastic chemotherapy 10/02/2015  . Breast cancer metastasized to multiple sites ([place]) 10/02/2015  . Breast cancer metastasized to liver ([place]) 09/27/2015  . Liver lesion, right lobe 09/06/2015  . Capsulitis 07/23/2015  . DDD (degenerative disc disease), lumbar 06/27/2015  . Ovarian CA, unspecified laterality ([place]) 05/08/2015  . Osteoarthritis of ankle and foot 04/01/2015  . Morbid obesity with BMI of 40.0-44.9, adult ([place]) 03/29/2015  . Bilateral malignant neoplasm of breast in female [facility]) 03/29/2015  . Breast cancer metastasized to pleura ([place]) 12/11/2014  . Obesity (BMI 30-39.9) 12/11/2014  . 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 High risk medication use 12/11/2014  . Bilateral breast cancer ([place]) 09/25/2013  . DM2 (diabetes mellitus, type 2) ([place]) 01/21/2012  . Hyperlipidemia 01/21/2012  . Melanoma of upper back excluding scapular region ([place]) 09/22/2011  . Ovarian cancer ([place]) 08/09/2011  . Uterine carcinoma ([place]) 08/09/2011    Past Surgical History:  Procedure Laterality Date  . ABDOMINAL HYSTERECTOMY  10/2008  . INSERTION OF MESH  05/28/2012   Procedure: INSERTION OF MESH;  Surgeon: Adin Hector, MD;  Location: [facility];  Service: General;  Laterality:  N/A;  . KNEE ARTHROSCOPY  04/2010   Right knee  . LAPAROSCOPIC LYSIS OF ADHESIONS  05/28/2012   Procedure: LAPAROSCOPIC LYSIS OF ADHESIONS;  Surgeon: Adin Hector, MD;  Location: [facility];  Service: General;  Laterality: N/A;  . MASTECTOMY  1997   Bilateral with lymph nodes  . Melanoma removal  02/2009, 07/2009  . VENTRAL HERNIA REPAIR  05/28/2012   Procedure: LAPAROSCOPIC VENTRAL HERNIA;  Surgeon: Adin Hector, MD;  Location: [facility];  Service: General;  Laterality: N/A;     OB History   No obstetric  history on file.      Home Medications    Prior to Admission medications   Medication Sig Start Date End Date Taking? Authorizing Provider  amiodarone (PACERONE) 200 MG tablet Take 1 tablet (200 mg total) by mouth daily. START by taking 225m (1 tablet) twice daily, THEN reduce to 2067m(1 tablet) once daily 06/13/18  Yes UrBaldwin JamaicaPA-C  Calcium Carb-Cholecalciferol (CALCIUM 600+D3) 600-800 MG-UNIT TABS Take 1 tablet by mouth 2 (two) times daily.    Yes [provider]  Cholecalciferol (VITAMIN D3) 2000 UNITS TABS Take 2,000 mg by mouth daily.   Yes [provider]  dexamethasone (DECADRON) 0.5 MG/5ML solution Swish 1022mn mouth for 2mi64mnd spit out. Use 4 times daily for 8 weeks, start with Afinitor. Avoid eating/drinking for 1hr after rinse. 07/20/18  Yes GudeNicholas Lose  everolimus (AFINITOR) 10 MG tablet Take 1 tablet (10 mg total) by mouth daily. 07/20/18  Yes GudeNicholas Lose  exemestane (AROMASIN) 25 MG tablet Take 1 tablet (25 mg total) by mouth daily after breakfast. 07/20/18  Yes GudeNicholas Lose  ferrous sulfate 325 (65 FE) MG tablet Take 325 mg by mouth daily as needed (energy).    Yes [provider]  fish oil-omega-3 fatty acids 1000 MG capsule Take 1 g by mouth 2 (two) times daily.    Yes [provider]  furosemide (LASIX) 20 MG tablet Take 20 mg by mouth daily as needed for fluid or edema. Reported on 05/07/2015 10/08/14  Yes [provider]  gabapentin (NEURONTIN) 100 MG capsule Take 100 mg by mouth 2 (two) times daily. 07/27/18  Yes [provider]  glimepiride (AMARYL) 2 MG tablet Take 1 tablet (2 mg total) by mouth 2 (two) times daily. Patient taking differently: Take 4 mg by mouth 2 (two) times daily.  08/09/18  Yes GudeNicholas Lose  ibuprofen (ADVIL,MOTRIN) 200 MG tablet Take 400 mg by mouth every 6 (six) hours as needed for headache (pain). Reported on 11/12/2015   Yes [provider]  Magnesium Oxide  (MAG-OXIDE) 200 MG TABS Take 1 tablet (200 mg total) by mouth daily. Patient taking differently: Take 200 mg by mouth at bedtime.  06/06/18  Yes HaviIsla Pence  metFORMIN (GLUCOPHAGE) 500 MG tablet Take 1,000 mg by mouth 2 (two) times daily with a meal.    Yes [provider]  metoprolol succinate (TOPROL-XL) 25 MG 24 hr tablet Take 1 tablet (25 mg total) by mouth daily. 06/29/18  Yes AchaElouise Munroe  naproxen sodium (ALEVE) 220 MG tablet Take 220 mg by mouth daily as needed (pain).   Yes [provider]  rivaroxaban (XARELTO) 20 MG TABS tablet Take 20 mg by mouth daily with supper.   Yes [provider]  simvastatin (ZOCOR) 40 MG tablet Take 40 mg by mouth at bedtime.     Yes [provider]  sodium chloride (OCEAN) 0.65 % SOLN nasal spray Place 1 spray into both nostrils as needed for congestion.   Yes [provider]  gabapentin (NEURONTIN) 300 MG capsule Take 1 capsule (300 mg total) by mouth 2 (two) times daily. Patient not taking: Reported on 08/24/2018 04/10/18   Nicholas Lose, MD    Family History Family History  Problem Relation Age of Onset  . Stroke Mother   . Cancer Father        'voice box'  . Colon cancer Sister        dx >50  . Heart attack Brother   . Skin cancer Brother 27  . Cancer Sister        primary site unk, dx >50  . Stroke Brother   . Stroke Sister   . Colon cancer Sister 25  . Dementia Sister   . Breast cancer Sister        dx >5-, met to brain    Social History Social History   Tobacco Use  . Smoking status: Never Smoker  . Smokeless tobacco: Never Used  Substance Use Topics  . Alcohol use: No  . Drug use: No     Allergies   Prednisolone; Prednisone; and Sulfa antibiotics   Review of Systems Review of Systems  Constitutional: Negative for fever.  Respiratory: Positive for cough and shortness of breath.   Cardiovascular: Negative for chest pain and leg swelling.  Gastrointestinal:  Positive for diarrhea. Negative for vomiting.  Endocrine: Positive for polydipsia.  Neurological: Positive for weakness.  All other systems reviewed and are negative.    Physical Exam Updated Vital Signs BP 123/67 (BP Location: Right Arm)   Pulse 85   Temp 98.3 F (36.8 C) (Oral)   Resp (!) 26   SpO2 97%   Physical Exam Vitals signs and nursing note reviewed.  Constitutional:      General: She is not in acute distress.    Appearance: She is well-developed. She is obese. She is not ill-appearing or diaphoretic.  HENT:     Head: Normocephalic and atraumatic.     Right Ear: External ear normal.     Left Ear: External ear normal.     Nose: Nose normal.  Eyes:     General:        Right eye: No discharge.        Left eye: No discharge.  Cardiovascular:     Rate and Rhythm: Normal rate and regular rhythm.     Heart sounds: Normal heart sounds.  Pulmonary:     Effort: Pulmonary effort is normal. No tachypnea or accessory muscle usage.     Breath sounds: Normal breath sounds. No decreased breath sounds, wheezing or rhonchi.  Abdominal:     General: There is no distension.  Musculoskeletal:     Right lower leg: No edema.     Left lower leg: No edema.  Skin:    General: Skin is warm and dry.  Neurological:     Mental Status: She is alert.  Psychiatric:        Mood and Affect: Mood is not anxious.      ED Treatments / Results  Labs (all labs ordered are listed, but only abnormal results are displayed) Labs Reviewed  BASIC METABOLIC PANEL - Abnormal; Notable for the following components:      Result Value   Glucose, Bld 551 (*)    Creatinine, Ser 1.20 (*)    Calcium 8.3 (*)  GFR calc non Af Amer 44 (*)    GFR calc Af Amer 51 (*)    All other components within normal limits  CBC WITH DIFFERENTIAL/PLATELET - Abnormal; Notable for the following components:   RBC 3.61 (*)    Hemoglobin 11.6 (*)    HCT 35.7 (*)    RDW 18.6 (*)    Platelets 147 (*)    nRBC 0.3 (*)     All other components within normal limits  TROPONIN I - Abnormal; Notable for the following components:   Troponin I 0.03 (*)    All other components within normal limits  CBG MONITORING, ED - Abnormal; Notable for the following components:   Glucose-Capillary 549 (*)    All other components within normal limits  BRAIN NATRIURETIC PEPTIDE  URINALYSIS, ROUTINE W REFLEX MICROSCOPIC  TROPONIN I  CBG MONITORING, ED    EKG EKG Interpretation  Date/Time:  Friday August 24 2018 13:30:56 EDT Ventricular Rate:  85 PR Interval:    QRS Duration: 94 QT Interval:  361 QTC Calculation: 430 R Axis:   61 Text Interpretation:  Sinus rhythm Ventricular premature complex Left ventricular hypertrophy Nonspecific T abnormalities, inferior leads pvcs and poor data tracing limit interpretation Confirmed by Sherwood Gambler [phone]) on 08/24/2018 2:01:03 PM   Radiology Dg Chest Portable 1 View  Result Date: 08/24/2018 CLINICAL DATA:  Cough.  Metastatic breast cancer EXAM: PORTABLE CHEST 1 VIEW COMPARISON:  06/06/2018 FINDINGS: Cardiac enlargement without heart failure. Right lung is clear. Persistent left lower lobe airspace density unchanged. Small left effusion also unchanged. Based on prior PET scan and CT, this is felt to be metastatic disease in the left lung base. IMPRESSION: Persistent left lower lobe airspace density and small left effusion. Based on prior reports, this is consistent with metastatic disease. Right lung remains clear.  No change from the prior study. Electronically Signed   By: Franchot Gallo M.D.   On: 08/24/2018 13:33    Procedures Procedures (including critical care time)  Medications Ordered in ED Medications  lactated ringers bolus 1,000 mL (1,000 mLs Intravenous New Bag/Given 08/24/18 1340)     Initial Impression / Assessment and Plan / ED Course  I have reviewed the triage vital signs and the nursing notes.  Pertinent labs & imaging results that were available during  my care of the patient were reviewed by me and considered in my medical decision making (see chart for details).        Patient was given an IV fluid bolus.  She is hyperglycemic but with an anion gap of 15 I do not think this is DKA.  She has mild bump in creatinine but no renal failure.  She has some chronic lung changes but no new or concerning findings.  Troponin is technically abnormal but not really ACS.  Will get a second troponin.  Care to Dr. Laverta Baltimore with UA, troponin pending.  Final Clinical Impressions(s) / ED Diagnoses   Final diagnoses:  Hyperglycemia    ED Discharge Orders    None       Sherwood Gambler, MD 08/24/18 1556

## 2018-08-24 NOTE — ED Notes (Signed)
CBG 394

## 2018-08-24 NOTE — Progress Notes (Signed)
[place] [id number] 1945/10/22 73 y.o.  Brittany Porter is managed by Dr. 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 Nicholas Lose and is treated with exemestane  and Afinitor.  She is scheduled to see him in follow-up on 11/09/2018.  Increasing shortness of breath, cough, rhinorrhea, and diarrhea: Dr. Jamse Arn of the [facility] [place]. [address] [place], [place] was contacted. Brittany Porter's symptoms were discussed with Dr. Lanny Cramp. He was agreeable to see this patient.  The patient was given contact information and was instructed to present to urgent care immediately for evaluation and management. Brittany Porter expressed understanding and agreement with this plan.  This case was discussed with Dr. Geralyn Flash nurse.   Please see After Visit Summary for patient specific instructions.  Future Appointments  Date Time Provider [place]  09/26/2018 10:30 AM [facility] [place] [facility] None  11/05/2018 10:00 AM Sherran Needs, NP [facility] None  11/09/2018 11:30 AM [facility] LAB 4 [facility] None  11/09/2018 11:45 AM Nicholas Lose, MD [facility] None  11/09/2018 12:15 PM [facility] [place] [facility] None    No orders of the defined types were placed in this encounter.      Subjective:   Patient ID:  Brittany Porter  is a 73 y.o. (DOB 11/06/1945) female.  Chief Complaint: No chief complaint on file.   HPI Brittany Porter  is a 73 y.o. female who has a history of a metastatic breast cancer with metastatic disease to the liver and pleural surfaces.  She has had a history of a malignant pleural effusion.  There is also question of risk for interstitial lung disease secondary to Afinitor.  She has a history of baseline shortness of breath and diarrhea.  Her diarrhea is suspected secondary to Afinitor.  She contacted her office today stating that her shortness of breath had greatly increased over the past several days.  She reports that she finds herself gasping for breath.  She also has a cough and rhinorrhea.  She is afebrile.  She believes that she may have a sinus infection.  Are you experiencing any of the following:  Shortness of breath:        Yes Cough :         Yes Sneezing:         No Rhinorrhea:         Yes Sore throat:         No Headache:         No Diarrhea:         Yes Fever:          No  Have you been tested for COVID-19?     No Have you tested positive for COVID-19?     No Have you had contact with anyone testing positive for COVID-19?  No         Allergies:  Allergies  Allergen Reactions  . Prednisolone Shortness Of Breath and Other (See Comments)     increased BP  . Prednisone Anaphylaxis  . Sulfa Antibiotics Swelling    "Eyes swelled shut" - reaction to eye drops    Please see review of systems for further details on the patient's review from today.   Review of Systems:  Review of Systems  Constitutional: Negative for chills, diaphoresis and fever.  HENT: Positive for rhinorrhea. Negative for congestion, sneezing and sore throat.   Respiratory: Positive for cough and shortness of breath.   Cardiovascular: Negative for chest pain.  Gastrointestinal: Positive for diarrhea.  Neurological: Negative for headaches.   Lab Review:     Component Value Date/Time   NA 140  08/09/2018 1114   NA 141 04/19/2018 1103   NA 141 05/02/2017 1047   K 3.8 08/09/2018 1114   K 4.6 05/02/2017 1047   CL 102 08/09/2018 1114   CL 102 07/12/2012 0956   CO2 28 08/09/2018 1114   CO2 30 (H) 05/02/2017 1047   GLUCOSE 250 (H) 08/09/2018 1114   GLUCOSE 141 (H) 05/02/2017 1047   GLUCOSE 133 (H) 07/12/2012 0956   BUN 11 08/09/2018 1114   BUN 18 04/19/2018 1103   BUN 21.1 05/02/2017 1047   CREATININE 0.83 08/09/2018 1114   CREATININE 0.8 05/02/2017 1047   CALCIUM 7.7 (L) 08/09/2018 1114   CALCIUM 8.8 05/02/2017 1047   PROT 6.7 08/09/2018 1114   PROT 6.6 05/02/2017 1047   ALBUMIN 3.2 (L) 08/09/2018 1114   ALBUMIN 3.6 05/02/2017 1047   AST 27 08/09/2018 1114   AST 23 05/02/2017 1047   ALT 16 08/09/2018 1114   ALT 13 05/02/2017 1047   ALKPHOS 57 08/09/2018 1114   ALKPHOS 46 05/02/2017 1047   BILITOT 0.7 08/09/2018 1114   BILITOT 0.97 05/02/2017 1047   GFRNONAA >60 08/09/2018 1114   GFRAA >60 08/09/2018 1114       Component Value Date/Time   WBC 4.0 08/09/2018 1114   WBC 3.5 (L) 06/11/2018 2203   RBC 3.19 (L) 08/09/2018 1114   HGB 11.1 (L) 08/09/2018 1114   HGB 11.8 05/02/2017 1047   HCT 34.0 (L) 08/09/2018 1114   HCT 35.2 05/02/2017 1047   PLT 148 (L) 08/09/2018 1114   PLT 148 05/02/2017 1047   MCV 106.6 (H) 08/09/2018 1114   MCV 103.9 (H) 05/02/2017 1047   MCH 34.8 (H) 08/09/2018 1114   MCHC 32.6 08/09/2018 1114   RDW 15.9 (H) 08/09/2018 1114   RDW 15.1 (H) 05/02/2017 1047   LYMPHSABS 1.0 08/09/2018 1114   LYMPHSABS 0.8 (L) 05/02/2017 1047   MONOABS 0.5 08/09/2018 1114   MONOABS 0.3 05/02/2017 1047   EOSABS 0.1 08/09/2018 1114   EOSABS 0.0 05/02/2017 1047   EOSABS 0.1 11/17/2009 1136   BASOSABS 0.1 08/09/2018 1114   BASOSABS 0.0 05/02/2017 1047   -------------------------------  Imaging from last 24 hours (if applicable):  Radiology interpretation: No results found.

## 2018-08-24 NOTE — Telephone Encounter (Signed)
Spoke with RN Val.  Pt called MD Magrinat reporting increased SOB, gasping breaths, and coughing w/out sputum.  Pt on oral chemo, possible side effect is interstitial lung disease.  Afebrile per pt from home.  PA Lucianne Lei made aware, called pt to discuss plan to have her visit [facility] for evaluation based on current Covid-19 precautions.  Pt VU of plan and to call back as needed.  RN Val aware.

## 2018-08-24 NOTE — ED Triage Notes (Signed)
Pt states she was put on a new medicine and she doesn't like the side effects, was told to come here to "get a chest xray". Pt states shes had diarrhea and her blood sugar has been high.

## 2018-08-24 NOTE — Telephone Encounter (Addendum)
Oral Oncology Pharmacist Encounter  Received call from patient with multiple complaints she perceives is due to continued Afinitor administration.  Patient endorses continued escalation of her blood glucose.  She states blood sugar was over 600 last night and is still over 400 this morning.  She states her glimepiride has been increased to different times since she was last seen in the office with Dr. Lindi Adie and her sugars are only worsening.  Patient endorses daily diarrhea since Afinitor initiation.  It is been particularly worse this week.  Patient endorses shortness of breath that she states has been intermittent since Afinitor initiation.  Audible gasping is noted during our conversation.  Patient endorses progressive fatigue since Afinitor initiation.  She states it has worsened this week.  Patient denies fever.  She states her highest temperature has been 99.1.  It was recorded as 99.5 on 08/09/2018 when patient was here for her fulvestrant injection, however, she states she had just had a cup of coffee prior to temperature check.  Patient informed Dr. Lindi Adie and collaborative practice RN would be alerted to her issues and she may receive a call back from them later today with further instructions.  [name], PharmD, BCPS, BCOP  08/24/2018 8:48 AM Oral Oncology Clinic [phone]

## 2018-08-24 NOTE — Discharge Instructions (Signed)
Recommending further evaluation and management in the ED.

## 2018-08-28 ENCOUNTER — Telehealth: Payer: Self-pay | Admitting: Pharmacist

## 2018-08-28 ENCOUNTER — Telehealth: Payer: Self-pay | Admitting: *Deleted

## 2018-08-28 DIAGNOSIS — C782 Secondary malignant neoplasm of pleura: Principal | ICD-10-CM

## 2018-08-28 DIAGNOSIS — C50919 Malignant neoplasm of unspecified site of unspecified female breast: Secondary | ICD-10-CM

## 2018-08-28 NOTE — Telephone Encounter (Signed)
Received message from Mosetta Pigeon, oral pharmacist stating that pt was in the ED and had high blood sugars and is going to see her PCP to get her started on insulin.  Per Dr. Lindi Adie, have pt stop taking Afinitor for a week and to follow up at the cancer center next week for labs and MD apt.  Pt verbalized understanding and scheduling message sent.

## 2018-08-28 NOTE — Telephone Encounter (Signed)
Oral Oncology Pharmacist Encounter  Followed up with patient after ED visit on Friday to check status. She continues on Afinitor.  Patient states blood sugars are still 300-550. Her PCP is starting her on insulin tomorrow. She will discontinue glimepiride when she initiates insulin.  She states frequent urination is improved with initiation of cephalexin.  She states HRs are staying in the 90s-100s, BP is normal.  She states she still feels weak and generally not well. She just wants to feel good again.  Patient denies any fevers.  Next OV with Dr. Lindi Adie is 11/09/2018. Follow up note will be forwarded to MD.  [name], PharmD, BCPS, BCOP  08/28/2018 8:50 AM Oral Oncology Clinic [phone]

## 2018-08-28 NOTE — Telephone Encounter (Signed)
Called and spoke with the patient regarding her appts for next week. Appt date/times given for 4/22

## 2018-09-04 ENCOUNTER — Other Ambulatory Visit: Payer: Self-pay | Admitting: *Deleted

## 2018-09-04 DIAGNOSIS — C50919 Malignant neoplasm of unspecified site of unspecified female breast: Secondary | ICD-10-CM

## 2018-09-04 DIAGNOSIS — C782 Secondary malignant neoplasm of pleura: Principal | ICD-10-CM

## 2018-09-04 NOTE — Progress Notes (Signed)
Patient Care Team: [name], MD as PCP - General (Family Medicine)  DIAGNOSIS:    ICD-10-CM   1. 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 BRCA reportedly normal.    08/25/2015 Relapse/Recurrence    new solitary liver lesion, biopsy showing metastatic ER PR + breast    09/25/2015 - 03/11/2016 Chemotherapy    Xeloda. Stopped due to progression of liver and bone metastases; pleural metastases    03/31/2016 -  Anti-estrogen oral therapy    Foslodex started 03/31/16, currently monthly  Ibrance 164m 3 weeks on, 1 week off started 03/31/16 Xgeva started on 05/05/16, currently every 3 months  Letrozole daily added 08/01/17     03/20/2018 Genetic Testing    Genetic testing performed through [facility]'s Multi-Cancer reported out on 03/19/2018 showed no pathogenic mutations. The Multi-Cancer Panel offered by [facility] includes sequencing and/or deletion duplication testing of the following 91 genes: AIP, ALK, APC, ATM, AXIN2, BAP1, BARD1, BLM, BMPR1A, BRCA1, BRCA2, BRIP1, BUB1B, CASR, CDC73, CDH1, CDK4, CDKN1B, CDKN1C, CDKN2A, CEBPA, CEP57, CHEK2, CTNNA1, DICER1, DIS3L2, EGFR, ENG, EPCAM, FH, FLCN, GALNT12, GATA2, GPC3, GREM1, HOXB13, HRAS, KIT, MAX, MEN1, MET, MITF, MLH1, MLH3, MSH2, MSH3, MSH6, MUTYH, NBN, NF1, NF2, NTHL1, PALB2, PDGFRA, PHOX2B, PMS2, POLD1, POLE, POT1, PRKAR1A, PTCH1, PTEN, RAD50, RAD51C,  RAD51D, RB1, RECQL4, RET, RNF43, RPS20, RUNX1, SDHA, SDHAF2, SDHB, SDHC, SDHD, SMAD4, SMARCA4, SMARCB1, SMARCE1, STK11, SUFU, TERC, TERT, TMEM127, TP53, TSC1, TSC2, VHL, WRN, WT1  A variant of uncertain significance (VUS) in a gene called MET was also noted. [id number] (p.Thr557Ala)    04/08/2018 Miscellaneous    [facility] molecular testing: Androgen receptor positive, PDL 1 neg, MSI stable, NTRK1/2/3Neg, T MBB intermediate 9 mutations/Mb, ESR 1-, PI 3 CA negative, BRCA negative     [place] of [place] metastatic to bone ([facility]   03/27/2016 Initial Diagnosis    [place]    03/31/2016 -  Chemotherapy    Foslodex started 03/31/16, currently monthly  Ibrance 1269m3 weeks on, 1 week off started 03/31/16 Daily letrozole added 08/01/17      Breast cancer metastasized to liver, unspecified laterality ([facility]   03/27/2016 Initial Diagnosis    Breast cancer metastasized to liver, unspecified laterality ([facility]    CHIEF COMPLIANT: Metastatic breast cancer on exemestane with everolimus  INTERVAL HISTORY: RuTRANNIE BARDALESs a [age].o. with above-mentioned history of metastatic breast cancerwith metastases to thebone,liver,and lung who is currently on exemestane with everolimus. On 08/24/18 she presented to the symptom management clinic and then the ER for hyperglycemia, cough, SOB, and diarrhea. She presents to the clinic alone today for treatment.  She tells me that she does not feel well, she has shortness of breath to minimal exertion, energy levels are fairly low.  Does not have any mucositis but has intermittent diarrhea.  Her blood sugars are a major concern.  She  was started on insulin.  Still there are episodes of very high blood sugars.  This is in spite of stopping everolimus.  REVIEW OF SYSTEMS:   Constitutional: Denies fevers, chills or abnormal weight loss Eyes: Denies blurriness of vision Ears, nose, mouth, throat, and face: Denies mucositis or  sore throat Respiratory: Shortness of breath Cardiovascular: Atrial fibrillation Gastrointestinal: Intermittent diarrhea Skin: Denies abnormal skin rashes Lymphatics: Denies new lymphadenopathy or easy bruising Neurological: Denies numbness, tingling or new weaknesses Behavioral/Psych: Mood is stable, no new changes  Extremities: No lower extremity edema Breast: denies any pain or lumps or nodules in either breasts All other systems were reviewed with the patient and are negative.  I have reviewed the past medical history, past surgical history, social history and family history with the patient and they are unchanged from previous note.  ALLERGIES:  is allergic to prednisolone; prednisone; and sulfa antibiotics.  MEDICATIONS:  Current Outpatient Medications  Medication Sig Dispense Refill   amiodarone (PACERONE) 200 MG tablet Take 1 tablet (200 mg total) by mouth daily. START by taking 274m (1 tablet) twice daily, THEN reduce to 2084m(1 tablet) once daily 42 tablet 3   Calcium Carb-Cholecalciferol (CALCIUM 600+D3) 600-800 MG-UNIT TABS Take 1 tablet by mouth 2 (two) times daily.      Cholecalciferol (VITAMIN D3) 2000 UNITS TABS Take 2,000 mg by mouth daily.     dexamethasone (DECADRON) 0.5 MG/5ML solution Swish 1094mn mouth for 2mi83mnd spit out. Use 4 times daily for 8 weeks, start with Afinitor. Avoid eating/drinking for 1hr after rinse. 500 mL 3   everolimus (AFINITOR) 10 MG tablet Take 1 tablet (10 mg total) by mouth daily. 30 tablet 3   exemestane (AROMASIN) 25 MG tablet Take 1 tablet (25 mg total) by mouth daily after breakfast. 90 tablet 3   ferrous sulfate 325 (65 FE) MG tablet Take 325 mg by mouth daily as needed (energy).      fish oil-omega-3 fatty acids 1000 MG capsule Take 1 g by mouth 2 (two) times daily.      furosemide (LASIX) 20 MG tablet Take 20 mg by mouth daily as needed for fluid or edema. Reported on 05/07/2015     gabapentin (NEURONTIN) 100 MG capsule  Take 100 mg by mouth 2 (two) times daily.     gabapentin (NEURONTIN) 300 MG capsule Take 1 capsule (300 mg total) by mouth 2 (two) times daily. (Patient not taking: Reported on 08/24/2018)     ibuprofen (ADVIL,MOTRIN) 200 MG tablet Take 400 mg by mouth every 6 (six) hours as needed for headache (pain). Reported on 11/12/2015     Magnesium Oxide (MAG-OXIDE) 200 MG TABS Take 1 tablet (200 mg total) by mouth daily. (Patient taking differently: Take 200 mg by mouth at bedtime. ) 30 tablet 0   metFORMIN (GLUCOPHAGE) 500 MG tablet Take 2 tablets (1,000 mg total) by mouth 2 (two) times daily with a meal for 30 days. 120 tablet 0   metoprolol succinate (TOPROL-XL) 25 MG 24 hr tablet Take 1 tablet (25 mg total) by mouth daily. 30 tablet 6   naproxen sodium (ALEVE) 220 MG tablet Take 220 mg by mouth daily as needed (pain).     rivaroxaban (XARELTO) 20 MG TABS tablet Take 20 mg by mouth daily with supper.     simvastatin (ZOCOR) 40 MG tablet Take 40 mg by mouth at bedtime.       sodium chloride (OCEAN) 0.65 % SOLN nasal spray Place 1 spray into  both nostrils as needed for congestion.     No current facility-administered medications for this visit.     PHYSICAL EXAMINATION: ECOG PERFORMANCE STATUS: 1 - Symptomatic but completely ambulatory  Vitals:   09/05/18 1023  BP: (!) 126/92  Pulse: 88  Resp: 17  Temp: 98.8 F (37.1 C)  SpO2: 94%   Filed Weights   09/05/18 1023  Weight: 199 lb (90.3 kg)    GENERAL: alert, no distress and comfortable SKIN: skin color, texture, turgor are normal, no rashes or significant lesions EYES: normal, Conjunctiva are pink and non-injected, sclera clear OROPHARYNX: no exudate, no erythema and lips, buccal mucosa, and tongue normal  NECK: supple, thyroid normal size, non-tender, without nodularity LYMPH: no palpable lymphadenopathy in the cervical, axillary or inguinal LUNGS: Decreased breath sounds at the bases HEART: Atrial fibrillation ABDOMEN: abdomen  soft, non-tender and normal bowel sounds MUSCULOSKELETAL: no cyanosis of digits and no clubbing  NEURO: alert & oriented x 3 with fluent speech, no focal motor/sensory deficits EXTREMITIES: No lower extremity edema  LABORATORY DATA:  I have reviewed the data as listed CMP Latest Ref Rng & Units 09/05/2018 08/24/2018 08/09/2018  Glucose 70 - 99 mg/dL 308(H) 551(HH) 250(H)  BUN 8 - 23 mg/dL _0 Creatinine 0.60 - 1.20 mg/dL 1.04 1.20(H) 0.83  Sodium 135 - 145 mmol/L 138 135 140  Potassium 3.5 - 5.1 mmol/L 4.9 4.1 3.8  Chloride 98 - 111 mmol/L 102 98 102  CO2 22 - 32 mmol/L _1 Calcium 8.9 - 10.3 mg/dL 8.9 8.3(L) 7.7(L)  Total Protein 6.5 - 8.1 g/dL 6.4(L) - 6.7  Total Bilirubin 0.2 - 1.6 mg/dL 0.6 - 0.7  Alkaline Phos 38 - 126 U/L 75 - 57  AST 11 - 38 U/L 25 - 27  ALT 10 - 47 U/L 13 - 16    Lab Results  Component Value Date   WBC 7.1 09/05/2018   HGB 9.7 (L) 09/05/2018   HCT 31.1 (L) 09/05/2018   MCV 96.9 09/05/2018   PLT 212 09/05/2018   NEUTROABS 4.9 09/05/2018    ASSESSMENT & PLAN:  Breast cancer metastasized to liver [facility]) Metastatic breast cancer: recently progressive in liver and bone, also pleura. OnFaslodex + Ibrance beginning 03/31/2016  Toxicities with therapy: 1. Neutropenia: Mild does not require dose adjustment. 2. injection site discomfort very mild 3. Fatigue related to [employer] molecular testing: Androgen receptor positive, PDL 1 neg, MSI stable, NTRK1/2/3Neg, T MBB intermediate 9 mutations/Mb, ESR 1-, PI 3 CA negative, BRCA negative  CT CAP 07/18/2018: Progressive disease with enlargement of several masslike areas in the base of left hemithorax, increased number and size of hypovascular hepatic lesions(4.6 cm,1.7 cm, 2.4 cm, 2.7 cm) and bone metastases (sclerotic lesions throughout axial and appendicular skeleton similar in number but increased size).  Bone scan 07/18/2018: Uptake at multiple levels proximal right humerus, sternum, right  iliac bone, lateral left fourth or fifth rib, inferior right scapula She will be scheduled for monthly Faslodex and every 60-monthXgeva injections.    Current treatment: Exemestane with everolimus started 07/20/2018 discontinued 08/30/2018 due to severe hyperglycemia Everolimus toxicities: Intermittent loose stools and frequent urination Elevated blood sugars: Went to ED for hyperglycemia. Held Everolimus   I discussed with her that based upon intolerance to everolimus, I recommended switching to chemotherapy. I recommended Halaven and I discussed the risks and benefits of chemo. She understands that the benefit of Halaven would be to get a response to the  tumors.  Because she has had so many prior treatments the likelihood of response may be 25 to 30%.  Halaven does come with risks including mild nausea, neuropathy, cytopenias and LFT changes.   She will think about chemotherapy and will inform us of her decision.  If she wants to go to chemotherapy then we will need a port placement.  RTC in May for follow-up to finalize a treatment plan.  No orders of the defined types were placed in this encounter.  The patient has a good understanding of the overall plan. she agrees with it. she will call with any problems that may develop before the next visit here.  Nicholas Lose, MD 09/05/2018  Julious Oka Dorshimer am acting as scribe for Dr. Nicholas Lose.  I have reviewed the above documentation for accuracy and completeness, and I agree with the above.

## 2018-09-05 ENCOUNTER — Other Ambulatory Visit: Payer: Self-pay

## 2018-09-05 ENCOUNTER — Inpatient Hospital Stay (HOSPITAL_BASED_OUTPATIENT_CLINIC_OR_DEPARTMENT_OTHER): Payer: Medicare Other | Admitting: Hematology and Oncology

## 2018-09-05 ENCOUNTER — Inpatient Hospital Stay: Payer: Medicare Other

## 2018-09-05 DIAGNOSIS — C7951 Secondary malignant neoplasm of bone: Secondary | ICD-10-CM

## 2018-09-05 DIAGNOSIS — Z5111 Encounter for antineoplastic chemotherapy: Secondary | ICD-10-CM | POA: Diagnosis not present

## 2018-09-05 DIAGNOSIS — C50911 Malignant neoplasm of unspecified site of right female breast: Secondary | ICD-10-CM

## 2018-09-05 DIAGNOSIS — R0609 Other forms of dyspnea: Secondary | ICD-10-CM | POA: Diagnosis not present

## 2018-09-05 DIAGNOSIS — C787 Secondary malignant neoplasm of liver and intrahepatic bile duct: Secondary | ICD-10-CM | POA: Insufficient documentation

## 2018-09-05 DIAGNOSIS — R197 Diarrhea, unspecified: Secondary | ICD-10-CM | POA: Diagnosis not present

## 2018-09-05 DIAGNOSIS — C50919 Malignant neoplasm of unspecified site of unspecified female breast: Secondary | ICD-10-CM

## 2018-09-05 DIAGNOSIS — R739 Hyperglycemia, unspecified: Secondary | ICD-10-CM | POA: Insufficient documentation

## 2018-09-05 DIAGNOSIS — C7800 Secondary malignant neoplasm of unspecified lung: Secondary | ICD-10-CM | POA: Diagnosis not present

## 2018-09-05 DIAGNOSIS — C782 Secondary malignant neoplasm of pleura: Secondary | ICD-10-CM

## 2018-09-05 LAB — HEMOGLOBIN A1C
Hgb A1c MFr Bld: 9.9 % — ABNORMAL HIGH (ref 4.8–5.6)
Mean Plasma Glucose: 237.43 mg/dL

## 2018-09-05 LAB — CBC WITH DIFFERENTIAL (CANCER CENTER ONLY)
Abs Immature Granulocytes: 0.06 10*3/uL (ref 0.00–0.07)
Basophils Absolute: 0 10*3/uL (ref 0.0–0.1)
Basophils Relative: 0 %
Eosinophils Absolute: 0.2 10*3/uL (ref 0.0–0.5)
Eosinophils Relative: 3 %
HCT: 31.1 % — ABNORMAL LOW (ref 36.0–46.0)
Hemoglobin: 9.7 g/dL — ABNORMAL LOW (ref 12.0–15.0)
Immature Granulocytes: 1 %
Lymphocytes Relative: 13 %
Lymphs Abs: 1 10*3/uL (ref 0.7–4.0)
MCH: 30.2 pg (ref 26.0–34.0)
MCHC: 31.2 g/dL (ref 30.0–36.0)
MCV: 96.9 fL (ref 80.0–100.0)
Monocytes Absolute: 1 10*3/uL (ref 0.1–1.0)
Monocytes Relative: 14 %
Neutro Abs: 4.9 10*3/uL (ref 1.7–7.7)
Neutrophils Relative %: 69 %
Platelet Count: 212 10*3/uL (ref 150–400)
RBC: 3.21 MIL/uL — ABNORMAL LOW (ref 3.87–5.11)
RDW: 20.9 % — ABNORMAL HIGH (ref 11.5–15.5)
WBC Count: 7.1 10*3/uL (ref 4.0–10.5)
nRBC: 0.4 % — ABNORMAL HIGH (ref 0.0–0.2)

## 2018-09-05 LAB — CMP (CANCER CENTER ONLY)
ALT: 13 U/L (ref 10–47)
AST: 25 U/L (ref 11–38)
Albumin: 2.6 g/dL — ABNORMAL LOW (ref 3.5–5.0)
Alkaline Phosphatase: 75 U/L (ref 38–126)
Anion gap: 11 (ref 5–15)
BUN: 11 mg/dL (ref 8–23)
CO2: 25 mmol/L (ref 22–32)
Calcium: 8.9 mg/dL (ref 8.9–10.3)
Chloride: 102 mmol/L (ref 98–111)
Creatinine: 1.04 mg/dL (ref 0.60–1.20)
GFR, Est AFR Am: >60 mL/min (ref >60)
GFR, Estimated: 53 mL/min — ABNORMAL LOW (ref >60)
Glucose, Bld: 308 mg/dL — ABNORMAL HIGH (ref 70–99)
Potassium: 4.9 mmol/L (ref 3.5–5.1)
Sodium: 138 mmol/L (ref 135–145)
Total Bilirubin: 0.6 mg/dL (ref 0.2–1.6)
Total Protein: 6.4 g/dL — ABNORMAL LOW (ref 6.5–8.1)

## 2018-09-05 NOTE — Assessment & Plan Note (Signed)
Metastatic breast cancer: recently progressive in liver and bone, also pleura. OnFaslodex + Ibrance beginning 03/31/2016  Toxicities with therapy: 1. Neutropenia: Mild does not require dose adjustment. 2. injection site discomfort very mild 3. Fatigue related to [employer] molecular testing: Androgen receptor positive, PDL 1 neg, MSI stable, NTRK1/2/3Neg, T MBB intermediate 9 mutations/Mb, ESR 1-, PI 3 CA negative, BRCA negative  CT CAP 07/18/2018: Progressive disease with enlargement of several masslike areas in the base of left hemithorax, increased number and size of hypovascular hepatic lesions(4.6 cm,1.7 cm, 2.4 cm, 2.7 cm) and bone metastases (sclerotic lesions throughout axial and appendicular skeleton similar in number but increased size).  Bone scan 07/18/2018: Uptake at multiple levels proximal right humerus, sternum, right iliac bone, lateral left fourth or fifth rib, inferior right scapula She will be scheduled for monthly Faslodex and every 58-monthXgeva injections.    Current treatment: Exemestane with everolimus started 07/20/2018 Everolimus toxicities: Intermittent loose stools and frequent urination Elevated blood sugars: Went to ED for hyperglycemia. Held Everolimus briefly. I discussed with patient about resuming it at a lower dosage.  RTC in 1 month with labs

## 2018-09-06 ENCOUNTER — Telehealth: Payer: Self-pay | Admitting: Hematology and Oncology

## 2018-09-06 NOTE — Telephone Encounter (Signed)
Tried to reach regarding schedule change

## 2018-09-08 MED FILL — EXEMESTANE 25 MG TABS: 25 | 30 days supply | Qty: 30 | Fill #2

## 2018-09-13 ENCOUNTER — Ambulatory Visit: Payer: Medicare Other

## 2018-09-24 ENCOUNTER — Other Ambulatory Visit: Payer: Self-pay | Admitting: Hematology and Oncology

## 2018-09-24 DIAGNOSIS — C7951 Secondary malignant neoplasm of bone: Secondary | ICD-10-CM

## 2018-09-24 DIAGNOSIS — C50919 Malignant neoplasm of unspecified site of unspecified female breast: Secondary | ICD-10-CM

## 2018-09-24 DIAGNOSIS — Z7189 Other specified counseling: Secondary | ICD-10-CM | POA: Insufficient documentation

## 2018-09-24 MED ORDER — LIDOCAINE-PRILOCAINE 2.5-2.5 % EX CREA: TOPICAL_CREAM | CUTANEOUS | 3 refills | Status: DC

## 2018-09-24 MED ORDER — PROCHLORPERAZINE MALEATE 10 MG PO TABS: 10.0000 mg | ORAL_TABLET | Freq: Four times a day (QID) | ORAL | 1 refills | Status: DC | PRN

## 2018-09-24 MED ORDER — ONDANSETRON HCL 8 MG PO TABS: 8.0000 mg | ORAL_TABLET | Freq: Two times a day (BID) | ORAL | 1 refills | Status: DC | PRN

## 2018-09-24 NOTE — Assessment & Plan Note (Addendum)
Metastatic breast cancer: recently progressive in liver and bone, also pleura.  Faslodex + Ibrance beginning 03/31/2016-March 2020 stopped due to progression Exemestane with everolimus discontinued 08/29/2018 due to hyperglycemia  [facility] molecular testing: Androgen receptor positive, PDL 1 neg, MSI stable, NTRK1/2/3Neg, T MBB intermediate 9 mutations/Mb, ESR 1-, PI 3 CA negative, BRCA negative  CT CAP 07/18/2018: Progressive disease with enlargement of several masslike areas in the base of left hemithorax, increased number and size of hypovascular hepatic lesions(4.6 cm,1.7 cm, 2.4 cm, 2.7 cm) and bone metastases (sclerotic lesions throughout axial and appendicular skeleton similar in number but increased size).  Bone scan 07/18/2018: Uptake at multiple levels proximal right humerus, sternum, right iliac bone, lateral left fourth or fifth rib, inferior right scapula She will be scheduled for every 4-monthXgeva injections. Exemestane with everolimus started 07/20/2018 discontinued 08/30/2018 due to severe hyperglycemia  Current treatment: Halaven palliative chemotherapy today cycle 1 day 1 Labs reviewed Chemo consent obtained Return to clinic in 1 week for toxicity check

## 2018-09-24 NOTE — Progress Notes (Signed)
DISCONTINUE ON PATHWAY REGIMEN - Breast     A cycle is every 28 days.:     Palbociclib        Dose Mod: None     Fulvestrant        Dose Mod: None     Fulvestrant        Dose Mod: None  **Always confirm dose/schedule in your pharmacy ordering system**  REASON: Disease Progression PRIOR TREATMENT: [id number]: Palbociclib 125 mg D1-21 + Fulvestrant 500 mg q28 Days Until Progression or Unacceptable Toxicity TREATMENT RESPONSE: Unable to Evaluate  START ON PATHWAY REGIMEN - Breast     A cycle is every 21 days:     Eribulin mesylate   **Always confirm dose/schedule in your pharmacy ordering system**  Patient Characteristics: Distant Metastases or Locoregional Recurrent Disease - Unresected or Locally Advanced Unresectable Disease Progressing after Neoadjuvant and Local Therapies, HER2 Negative/Unknown/Equivocal, ER Positive, Chemotherapy, Third Line and Beyond, Eribulin  Candidate Therapeutic Status: Distant Metastases BRCA Mutation Status: Absent ER Status: Positive (+) HER2 Status: Negative (-) PR Status: Positive (+) Line of Therapy: Third Line and Beyond Intent of Therapy: Non-Curative / Palliative Intent, Discussed with Patient

## 2018-09-25 NOTE — Progress Notes (Signed)
Patient Care Team: Brittany Ada, MD as PCP - General (Family Medicine)  DIAGNOSIS:    ICD-10-CM   1. Carcinoma of breast metastatic to bone, unspecified laterality (Brittany Porter) C50.919    C79.51   2. 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 BRCA reportedly normal.    08/25/2015 Relapse/Recurrence    new solitary liver lesion, biopsy showing metastatic ER PR + breast    09/25/2015 - 03/11/2016 Chemotherapy    Xeloda. Stopped due to progression of liver and bone metastases; pleural metastases    03/31/2016 - 07/19/2018 Anti-estrogen oral therapy    Foslodex started 03/31/16, currently monthly  Ibrance 14m 3 weeks on, 1 week off started 03/31/16 Xgeva started on 05/05/16 Letrozole daily added 08/01/17     03/20/2018 Genetic Testing    Genetic testing performed through [facility]'s Multi-Cancer reported out on 03/19/2018 showed no pathogenic mutations. The Multi-Cancer Panel offered by [facility] includes sequencing and/or deletion duplication testing of the following 91 genes: AIP, ALK, APC, ATM, AXIN2, BAP1, BARD1, BLM, BMPR1A, BRCA1, BRCA2, BRIP1, BUB1B, CASR, CDC73, CDH1, CDK4, CDKN1B, CDKN1C, CDKN2A, CEBPA, CEP57, CHEK2, CTNNA1, DICER1, DIS3L2, EGFR, ENG, EPCAM, FH, FLCN, GALNT12, GATA2, GPC3, GREM1, HOXB13, HRAS, KIT, MAX, MEN1, MET, MITF, MLH1, MLH3, MSH2, MSH3, MSH6, MUTYH, NBN, NF1, NF2, NTHL1, PALB2, PDGFRA,  PHOX2B, PMS2, POLD1, POLE, POT1, PRKAR1A, PTCH1, PTEN, RAD50, RAD51C, RAD51D, RB1, RECQL4, RET, RNF43, RPS20, RUNX1, SDHA, SDHAF2, SDHB, SDHC, SDHD, SMAD4, SMARCA4, SMARCB1, SMARCE1, STK11, SUFU, TERC, TERT, TMEM127, TP53, TSC1, TSC2, VHL, WRN, WT1  A variant of uncertain significance (VUS) in a gene called MET was also noted. c.1669A>G (p.Thr557Ala)    04/08/2018 Miscellaneous    [facility] molecular testing: Androgen receptor positive, PDL 1 neg, MSI stable, NTRK1/2/3Neg, T MBB intermediate 9 mutations/Mb, ESR 1-, PI 3 CA negative, BRCA negative    07/20/2018 - 08/30/2018 Anti-estrogen oral therapy    Exemestane with everolimus.  Discontinued due to severe hyperglycemia    09/24/2018 -  Chemotherapy    Halaven day 1 day 8 every 3 weeks      Carcinoma of breast metastatic to bone (Brittany Porter   03/27/2016 Initial Diagnosis    Carcinoma of breast metastatic to bone (Brittany Porter    03/31/2016 -  Chemotherapy    Foslodex started 03/31/16, currently monthly  Ibrance 1264m3 weeks on, 1 week off started 03/31/16 Daily letrozole added 08/01/17     09/26/2018 -  Chemotherapy    The patient had eriBULin mesylate (HALAVEN) 2.8 mg in sodium chloride 0.9 % 100 mL chemo infusion, 1.4 mg/m2 = 2.8 mg, Intravenous,  Once, 0 of 4 cycles  for chemotherapy treatment.      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 with above-mentioned history of metastatic breast cancerwith metastases to thebone,liver,and lung who could not tolerate exemestane with everolimus because of markedly elevated blood sugars.She presents to the clinicalonetoday for treatment discussion. Since she stopped Afinitor, her blood sugars have improved.  Dr. Tamala Julian her primary care physician has started her on insulin and  that has also helped her significantly.  I conferenced her daughter into the clinic and we discussed different options today.  REVIEW OF SYSTEMS:   Constitutional: Denies fevers, chills or abnormal weight loss Eyes: Denies blurriness of vision Ears, nose, mouth, throat, and face: Denies mucositis or sore throat Respiratory: Denies cough, dyspnea or wheezes Cardiovascular: Denies palpitation, chest discomfort Gastrointestinal: Denies nausea, heartburn or change in bowel habits Skin: Denies abnormal skin rashes Lymphatics: Denies new lymphadenopathy or easy bruising Neurological: Denies numbness, tingling or new weaknesses Behavioral/Psych: Mood is stable, no new changes  Extremities: No lower extremity edema Breast: denies any pain or lumps or nodules in either breasts All other systems were reviewed with the patient and are negative.  I have reviewed the past medical history, past surgical history, social history and family history with the patient and they are unchanged from previous note.  ALLERGIES:  is allergic to prednisolone; prednisone; and sulfa antibiotics.  MEDICATIONS:  Current Outpatient Medications  Medication Sig Dispense Refill   amiodarone (PACERONE) 200 MG tablet Take 1 tablet (200 mg total) by mouth daily. START by taking 264m (1 tablet) twice daily, THEN reduce to 2024m(1 tablet) once daily 42 tablet 3   Calcium Carb-Cholecalciferol (CALCIUM 600+D3) 600-800 MG-UNIT TABS Take 1 tablet by mouth 2 (two) times daily.      Cholecalciferol (VITAMIN D3) 2000 UNITS TABS Take 2,000 mg by mouth daily.     dexamethasone (DECADRON) 0.5 MG/5ML solution Swish 1057mn mouth for 2mi16mnd spit out. Use 4 times daily for 8 weeks, start with Afinitor. Avoid eating/drinking for 1hr after rinse. 500 mL 3   exemestane (AROMASIN) 25 MG tablet Take 1 tablet (25 mg total) by mouth daily after breakfast. 90 tablet 3   ferrous sulfate 325 (65 FE) MG tablet Take 325 mg by mouth daily as  needed (energy).      fish oil-omega-3 fatty acids 1000 MG capsule Take 1 g by mouth 2 (two) times daily.      furosemide (LASIX) 20 MG tablet Take 20 mg by mouth daily as needed for fluid or edema. Reported on 05/07/2015     gabapentin (NEURONTIN) 100 MG capsule Take 100 mg by mouth 2 (two) times daily.     ibuprofen (ADVIL,MOTRIN) 200 MG tablet Take 400 mg by mouth every 6 (six) hours as needed for headache (pain). Reported on 11/12/2015     lidocaine-prilocaine (EMLA) cream Apply to affected area once 30 g 3   Magnesium Oxide (MAG-OXIDE) 200 MG TABS Take 1 tablet (200 mg total) by mouth daily. (Patient taking differently: Take 200 mg by mouth at bedtime. ) 30 tablet 0   metFORMIN (GLUCOPHAGE) 500 MG tablet Take 2 tablets (1,000 mg total) by mouth 2 (two) times daily with a meal for 30 days. 120 tablet 0   metoprolol succinate (TOPROL-XL) 25 MG 24 hr tablet Take 1 tablet (25 mg total) by mouth daily. 30 tablet 6   naproxen sodium (ALEVE) 220 MG tablet Take 220 mg by mouth daily as needed (pain).     ondansetron (ZOFRAN) 8 MG tablet Take 1 tablet (8 mg total) by mouth 2 (two) times daily as  needed (Nausea or vomiting). 30 tablet 1   prochlorperazine (COMPAZINE) 10 MG tablet Take 1 tablet (10 mg total) by mouth every 6 (six) hours as needed (Nausea or vomiting). 30 tablet 1   rivaroxaban (XARELTO) 20 MG TABS tablet Take 20 mg by mouth daily with supper.     simvastatin (ZOCOR) 40 MG tablet Take 40 mg by mouth at bedtime.       sodium chloride (OCEAN) 0.65 % SOLN nasal spray Place 1 spray into both nostrils as needed for congestion.     No current facility-administered medications for this visit.    Facility-Administered Medications Ordered in Other Visits  Medication Dose Route Frequency Provider Last Rate Last Dose   fulvestrant (FASLODEX) injection 500 mg  500 mg Intramuscular Once Nicholas Lose, MD        PHYSICAL EXAMINATION: ECOG PERFORMANCE STATUS: 1 - Symptomatic but  completely ambulatory  Vitals:   09/26/18 0901  BP: (!) 156/77  Pulse: 85  Resp: 17  Temp: 98 F (36.7 C)  SpO2: 92%   Filed Weights   09/26/18 0901  Weight: 202 lb 12.8 oz (92 kg)    GENERAL: alert, no distress and comfortable SKIN: skin color, texture, turgor are normal, no rashes or significant lesions EYES: normal, Conjunctiva are pink and non-injected, sclera clear OROPHARYNX: no exudate, no erythema and lips, buccal mucosa, and tongue normal  NECK: supple, thyroid normal size, non-tender, without nodularity LYMPH: no palpable lymphadenopathy in the cervical, axillary or inguinal LUNGS: clear to auscultation and percussion with normal breathing effort HEART: regular rate & rhythm and no murmurs and no lower extremity edema ABDOMEN: abdomen soft, non-tender and normal bowel sounds MUSCULOSKELETAL: no cyanosis of digits and no clubbing  NEURO: alert & oriented x 3 with fluent speech, no focal motor/sensory deficits EXTREMITIES: No lower extremity edema  LABORATORY DATA:  I have reviewed the data as listed CMP Latest Ref Rng & Units 09/05/2018 08/24/2018 08/09/2018  Glucose 70 - 99 mg/dL 308(H) 551(HH) 250(H)  BUN 8 - 23 mg/dL '11 13 11  ' Creatinine 0.60 - 1.20 mg/dL 1.04 1.20(H) 0.83  Sodium 135 - 145 mmol/L 138 135 140  Potassium 3.5 - 5.1 mmol/L 4.9 4.1 3.8  Chloride 98 - 111 mmol/L 102 98 102  CO2 22 - 32 mmol/L '25 22 28  ' Calcium 8.9 - 10.3 mg/dL 8.9 8.3(L) 7.7(L)  Total Protein 6.5 - 8.1 g/dL 6.4(L) - 6.7  Total Bilirubin 0.2 - 1.6 mg/dL 0.6 - 0.7  Alkaline Phos 38 - 126 U/L 75 - 57  AST 11 - 38 U/L 25 - 27  ALT 10 - 47 U/L 13 - 16    Lab Results  Component Value Date   WBC 6.1 09/26/2018   HGB 10.3 (L) 09/26/2018   HCT 34.7 (L) 09/26/2018   MCV 100.3 (H) 09/26/2018   PLT 239 09/26/2018   NEUTROABS 4.0 09/26/2018    ASSESSMENT & PLAN:  Breast cancer metastasized to liver Brittany Porter) Metastatic breast cancer: recently progressive in liver and bone, also pleura.    Faslodex + Ibrance beginning 03/31/2016-March 2020 stopped due to progression Exemestane with everolimus discontinued 08/29/2018 due to hyperglycemia  [facility] molecular testing: Androgen receptor positive, PDL 1 neg, MSI stable, NTRK1/2/3Neg, T MBB intermediate 9 mutations/Mb, ESR 1-, PI 3 CA negative, BRCA negative  CT CAP 07/18/2018: Progressive disease with enlargement of several masslike areas in the base of left hemithorax, increased number and size of hypovascular hepatic lesions(4.6 cm,1.7 cm, 2.4 cm, 2.7 cm) and  bone metastases (sclerotic lesions throughout axial and appendicular skeleton similar in number but increased size).  Bone scan 07/18/2018: Uptake at multiple levels proximal right humerus, sternum, right iliac bone, lateral left fourth or fifth rib, inferior right scapula She will be scheduled for every 69-monthXgeva injections. Exemestane with everolimus started 07/20/2018 discontinued 08/30/2018 due to severe hyperglycemia  Current treatment: Resuming exemestane with everolimus along with Faslodex and Xgeva Labs reviewed I will have a conference with her in 2 weeks using Doximity media call to see if her blood sugars are under reasonable control. I also sent a message to Dr. STamala Julianto help uKoreawith her blood sugar issues. Today she receives Faslodex and XNiger  No orders of the defined types were placed in this encounter.  The patient has a good understanding of the overall plan. she agrees with it. she will call with any problems that may develop before the next visit here.  GNicholas Lose MD 09/26/2018  IJulious OkaDorshimer am acting as scribe for Dr. VNicholas Lose  I have reviewed the above documentation for accuracy and completeness, and I agree with the above.

## 2018-09-26 ENCOUNTER — Inpatient Hospital Stay: Payer: Medicare Other

## 2018-09-26 ENCOUNTER — Inpatient Hospital Stay: Payer: Medicare Other | Attending: Hematology and Oncology | Admitting: Hematology and Oncology

## 2018-09-26 ENCOUNTER — Other Ambulatory Visit: Payer: Self-pay

## 2018-09-26 ENCOUNTER — Ambulatory Visit: Payer: Medicare Other

## 2018-09-26 VITALS — BP 156/77 | HR 85 | Temp 98.0°F | Resp 17 | Ht 63.0 in | Wt 202.8 lb

## 2018-09-26 DIAGNOSIS — C50919 Malignant neoplasm of unspecified site of unspecified female breast: Secondary | ICD-10-CM

## 2018-09-26 DIAGNOSIS — C782 Secondary malignant neoplasm of pleura: Secondary | ICD-10-CM | POA: Diagnosis not present

## 2018-09-26 DIAGNOSIS — Z794 Long term (current) use of insulin: Secondary | ICD-10-CM

## 2018-09-26 DIAGNOSIS — C7800 Secondary malignant neoplasm of unspecified lung: Secondary | ICD-10-CM | POA: Diagnosis not present

## 2018-09-26 DIAGNOSIS — C787 Secondary malignant neoplasm of liver and intrahepatic bile duct: Secondary | ICD-10-CM | POA: Insufficient documentation

## 2018-09-26 DIAGNOSIS — Z5111 Encounter for antineoplastic chemotherapy: Secondary | ICD-10-CM | POA: Diagnosis not present

## 2018-09-26 DIAGNOSIS — C7951 Secondary malignant neoplasm of bone: Secondary | ICD-10-CM | POA: Insufficient documentation

## 2018-09-26 DIAGNOSIS — C50911 Malignant neoplasm of unspecified site of right female breast: Secondary | ICD-10-CM

## 2018-09-26 DIAGNOSIS — R739 Hyperglycemia, unspecified: Secondary | ICD-10-CM

## 2018-09-26 DIAGNOSIS — Z7189 Other specified counseling: Secondary | ICD-10-CM

## 2018-09-26 LAB — CMP (CANCER CENTER ONLY)
ALT: 10 U/L (ref 0–44)
AST: 21 U/L (ref 15–41)
Albumin: 2.8 g/dL — ABNORMAL LOW (ref 3.5–5.0)
Alkaline Phosphatase: 62 U/L (ref 38–126)
Anion gap: 10 (ref 5–15)
BUN: 12 mg/dL (ref 8–23)
CO2: 28 mmol/L (ref 22–32)
Calcium: 8.4 mg/dL — ABNORMAL LOW (ref 8.9–10.3)
Chloride: 104 mmol/L (ref 98–111)
Creatinine: 0.83 mg/dL (ref 0.44–1.00)
GFR, Est AFR Am: >60 mL/min (ref >60)
GFR, Estimated: >60 mL/min (ref >60)
Glucose, Bld: 198 mg/dL — ABNORMAL HIGH (ref 70–99)
Potassium: 3.9 mmol/L (ref 3.5–5.1)
Sodium: 142 mmol/L (ref 135–145)
Total Bilirubin: 0.7 mg/dL (ref 0.3–1.2)
Total Protein: 6.3 g/dL — ABNORMAL LOW (ref 6.5–8.1)

## 2018-09-26 LAB — CBC WITH DIFFERENTIAL (CANCER CENTER ONLY)
Abs Immature Granulocytes: 0.02 10*3/uL (ref 0.00–0.07)
Basophils Absolute: 0 10*3/uL (ref 0.0–0.1)
Basophils Relative: 1 %
Eosinophils Absolute: 0.3 10*3/uL (ref 0.0–0.5)
Eosinophils Relative: 4 %
HCT: 34.7 % — ABNORMAL LOW (ref 36.0–46.0)
Hemoglobin: 10.3 g/dL — ABNORMAL LOW (ref 12.0–15.0)
Immature Granulocytes: 0 %
Lymphocytes Relative: 19 %
Lymphs Abs: 1.2 10*3/uL (ref 0.7–4.0)
MCH: 29.8 pg (ref 26.0–34.0)
MCHC: 29.7 g/dL — ABNORMAL LOW (ref 30.0–36.0)
MCV: 100.3 fL — ABNORMAL HIGH (ref 80.0–100.0)
Monocytes Absolute: 0.5 10*3/uL (ref 0.1–1.0)
Monocytes Relative: 9 %
Neutro Abs: 4 10*3/uL (ref 1.7–7.7)
Neutrophils Relative %: 67 %
Platelet Count: 239 10*3/uL (ref 150–400)
RBC: 3.46 MIL/uL — ABNORMAL LOW (ref 3.87–5.11)
RDW: 24.5 % — ABNORMAL HIGH (ref 11.5–15.5)
WBC Count: 6.1 10*3/uL (ref 4.0–10.5)
nRBC: 0 % (ref 0.0–0.2)

## 2018-09-26 MED ORDER — DENOSUMAB 120 MG/1.7ML ~~LOC~~ SOLN
120.0000 mg | Freq: Once | SUBCUTANEOUS | Status: AC
  Administered 2018-09-26: 120 mg via SUBCUTANEOUS

## 2018-09-26 MED ORDER — DENOSUMAB 120 MG/1.7ML ~~LOC~~ SOLN
SUBCUTANEOUS | Status: AC
  Filled 2018-09-26: qty 1.7

## 2018-09-26 MED ORDER — FULVESTRANT 250 MG/5ML IM SOLN
INTRAMUSCULAR | Status: AC
  Filled 2018-09-26: qty 5

## 2018-09-26 MED ORDER — FULVESTRANT 250 MG/5ML IM SOLN
500.0000 mg | Freq: Once | INTRAMUSCULAR | Status: AC
  Administered 2018-09-26: 500 mg via INTRAMUSCULAR

## 2018-09-26 NOTE — Progress Notes (Signed)
Spoke w/ Dr. Lindi Adie, patient is to just receive xgeva and fulvestrant today and wants to continue her exemetstane/everolimus at this time.   Demetrius Charity, PharmD, [facility] Phone: [phone] 09/26/2018

## 2018-09-26 NOTE — Patient Instructions (Signed)

## 2018-10-10 ENCOUNTER — Telehealth: Payer: Self-pay | Admitting: Hematology and Oncology

## 2018-10-10 NOTE — Progress Notes (Signed)
HEMATOLOGY-ONCOLOGY DOXIMITY VISIT PROGRESS NOTE  I connected with Brittany Porter on 10/11/2018 at  3:00 PM EDT by Doximity video conference and verified that I am speaking with the correct person using two identifiers.  I discussed the limitations, risks, security and privacy concerns of performing an evaluation and management service by Doximity and the availability of in person appointments.  I also discussed with the patient that there may be a patient responsible charge related to this service. The patient expressed understanding and agreed to proceed.  Patient's Location: Home Physician Location: Clinic  CHIEF COMPLIANT: Follow-up of metastatic breast cancer on exemestane with everolimus  INTERVAL HISTORY: Brittany Porter is a 73 y.o. female with above-mentioned history of metastatic breast cancerwith metastases to thebone,liver,and lung who is currently on treatment with exemestane with everolimus. She presents over Doximity today to check in on her management of blood sugars.     Breast cancer metastasized to liver [facility])   1997 Initial Diagnosis    bilateral breast cancers 1997 treated with bilateral mastectomies and axillary node dissections, adjuvant chemotherapy and radiation.    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 BRCA reportedly normal.    08/25/2015 Relapse/Recurrence    new solitary liver lesion, biopsy showing metastatic ER PR + breast    09/25/2015 - 03/11/2016 Chemotherapy    Xeloda. Stopped due to progression of liver and bone metastases; pleural metastases    03/31/2016 - 07/19/2018 Anti-estrogen oral therapy    Foslodex started 03/31/16, currently monthly  Ibrance 176m 3 weeks on, 1 week  off started 03/31/16 Xgeva started on 05/05/16 Letrozole daily added 08/01/17     03/20/2018 Genetic Testing    Genetic testing performed through [facility]'s Multi-Cancer reported out on 03/19/2018 showed no pathogenic mutations. The Multi-Cancer Panel offered by [facility] includes sequencing and/or deletion duplication testing of the following 91 genes: AIP, ALK, APC, ATM, AXIN2, BAP1, BARD1, BLM, BMPR1A, BRCA1, BRCA2, BRIP1, BUB1B, CASR, CDC73, CDH1, CDK4, CDKN1B, CDKN1C, CDKN2A, CEBPA, CEP57, CHEK2, CTNNA1, DICER1, DIS3L2, EGFR, ENG, EPCAM, FH, FLCN, GALNT12, GATA2, GPC3, GREM1, HOXB13, HRAS, KIT, MAX, MEN1, MET, MITF, MLH1, MLH3, MSH2, MSH3, MSH6, MUTYH, NBN, NF1, NF2, NTHL1, PALB2, PDGFRA, PHOX2B, PMS2, POLD1, POLE, POT1, PRKAR1A, PTCH1, PTEN, RAD50, RAD51C, RAD51D, RB1, RECQL4, RET, RNF43, RPS20, RUNX1, SDHA, SDHAF2, SDHB, SDHC, SDHD, SMAD4, SMARCA4, SMARCB1, SMARCE1, STK11, SUFU, TERC, TERT, TMEM127, TP53, TSC1, TSC2, VHL, WRN, WT1  A variant of uncertain significance (VUS) in a gene called MET was also noted. c.1669A>G (p.Thr557Ala)    04/08/2018 Miscellaneous    [facility] molecular testing: Androgen receptor positive, PDL 1 neg, MSI stable, NTRK1/2/3Neg, T MBB intermediate 9 mutations/Mb, ESR 1-, PI 3 CA negative, BRCA negative    07/20/2018 - 08/30/2018 Anti-estrogen oral therapy    Exemestane with everolimus.  Discontinued due to severe hyperglycemia    09/24/2018 -  Chemotherapy    Halaven day 1 day 8 every 3 weeks      [place]   03/27/2016 Initial Diagnosis    C[place] of breast metastatic to bone ([facility]    03/31/2016 -  Chemotherapy    Foslodex started 03/31/16, currently monthly  Ibrance 1270m3 weeks on, 1 week off started 03/31/16  Daily letrozole added 08/01/17     09/26/2018 -  Chemotherapy    The patient had eriBULin mesylate (HALAVEN) 2.8 mg in sodium chloride 0.9 % 100 mL chemo infusion, 1.4 mg/m2 = 2.8 mg, Intravenous,  Once, 0 of 4 cycles   for chemotherapy treatment.      Breast cancer metastasized to liver, unspecified laterality ([place]) (Resolved)   03/27/2016 Initial Diagnosis    Breast cancer metastasized to liver, unspecified laterality ([place])     REVIEW OF SYSTEMS:   Constitutional: Denies fevers, chills or abnormal weight loss Eyes: Denies blurriness of vision Ears, nose, mouth, throat, and face: Denies mucositis or sore throat Respiratory: Denies cough, dyspnea or wheezes Cardiovascular: Denies palpitation, chest discomfort Gastrointestinal:  Denies nausea, heartburn or change in bowel habits Skin: Denies abnormal skin rashes Lymphatics: Denies new lymphadenopathy or easy bruising Neurological:Denies numbness, tingling or new weaknesses Behavioral/Psych: Mood is stable, no new changes  Extremities: No lower extremity edema Breast: denies any pain or lumps or nodules in either breasts All other systems were reviewed with the patient and are negative.  Observations/Objective:  There were no vitals filed for this visit. There is no height or weight on file to calculate BMI.  I have reviewed the data as listed CMP Latest Ref Rng & Units 09/26/2018 09/05/2018 08/24/2018  Glucose 70 - 99 mg/dL 198(H) 308(H) 551(HH)  BUN 8 - 23 mg/dL '12 11 13  ' Creatinine 0.44 - 1.00 mg/dL 0.83 1.04 1.20(H)  Sodium 135 - 145 mmol/L 142 138 135  Potassium 3.5 - 5.1 mmol/L 3.9 4.9 4.1  Chloride 98 - 111 mmol/L 104 102 98  CO2 22 - 32 mmol/L '28 25 22  ' Calcium 8.9 - 10.3 mg/dL 8.4(L) 8.9 8.3(L)  Total Protein 6.5 - 8.1 g/dL 6.3(L) 6.4(L) -  Total Bilirubin 0.3 - 1.2 mg/dL 0.7 0.6 -  Alkaline Phos 38 - 126 U/L 62 75 -  AST 15 - 41 U/L 21 25 -  ALT 0 - 44 U/L 10 13 -    Lab Results  Component Value Date   WBC 6.1 09/26/2018   HGB 10.3 (L) 09/26/2018   HCT 34.7 (L) 09/26/2018   MCV 100.3 (H) 09/26/2018   PLT 239 09/26/2018   NEUTROABS 4.0 09/26/2018      Assessment Plan:  Carcinoma of breast metastatic to bone [facility])  Metastatic breast cancer: recently progressive in liver and bone, also pleura.  Faslodex + Ibrance beginning 03/31/2016-March 2020 stopped due to progression Exemestane with everolimus discontinued 08/29/2018 due to hyperglycemia  [facility] molecular testing: Androgen receptor positive, PDL 1 neg, MSI stable, NTRK1/2/3Neg, T MBB intermediate 9 mutations/Mb, ESR 1-, PI 3 CA negative, BRCA negative  CT CAP 07/18/2018: Progressive disease with enlargement of several masslike areas in the base of left hemithorax, increased number and size of hypovascular hepatic lesions(4.6 cm,1.7 cm, 2.4 cm, 2.7 cm) and bone metastases (sclerotic lesions throughout axial and appendicular skeleton similar in number but increased size).  Bone scan 07/18/2018: Uptake at multiple levels proximal right humerus, sternum, right iliac bone, lateral left fourth or fifth rib, inferior right scapula She will be scheduled for every 30-monthXgeva injections. Exemestane with everolimus started 3/6/2020discontinued 08/30/2018 due to severe hyperglycemia  Current treatment:  09/26/2018: Resumed exemestane with everolimus along with Faslodex and Xgeva Patient's blood sugars are all over the place ranging from 150-350. She has an appointment with her primary care physician on Tuesday. She has insulin sliding scale that she is using currently. She wishes to stay  on everolimus as long as she can control her sugars.  Urinary tract infection: I sent a prescription for ciprofloxacin.  Return to clinic in 1 month for injection labs and follow-up.  I discussed the assessment and treatment plan with the patient. The patient was provided an opportunity to ask questions and all were answered. The patient agreed with the plan and demonstrated an understanding of the instructions. The patient was advised to call back or seek an in-person evaluation if the symptoms worsen or if the condition fails to improve as anticipated.   I provided 15  minutes of face-to-face Doximity time during this encounter.    Rulon Eisenmenger, MD 10/11/2018   I, Molly Dorshimer, am acting as scribe for Nicholas Lose, MD.  I have reviewed the above documentation for accuracy and completeness, and I agree with the above.

## 2018-10-10 NOTE — Telephone Encounter (Signed)
Left msg to verify info for webex appt for pre reg

## 2018-10-11 ENCOUNTER — Inpatient Hospital Stay (HOSPITAL_BASED_OUTPATIENT_CLINIC_OR_DEPARTMENT_OTHER): Payer: Medicare Other | Admitting: Hematology and Oncology

## 2018-10-11 DIAGNOSIS — Z170 Estrogen receptor positive status [ER+]: Secondary | ICD-10-CM

## 2018-10-11 DIAGNOSIS — C7951 Secondary malignant neoplasm of bone: Secondary | ICD-10-CM

## 2018-10-11 DIAGNOSIS — C50919 Malignant neoplasm of unspecified site of unspecified female breast: Secondary | ICD-10-CM | POA: Diagnosis not present

## 2018-10-11 DIAGNOSIS — C787 Secondary malignant neoplasm of liver and intrahepatic bile duct: Secondary | ICD-10-CM | POA: Diagnosis not present

## 2018-10-11 DIAGNOSIS — Z79811 Long term (current) use of aromatase inhibitors: Secondary | ICD-10-CM

## 2018-10-11 DIAGNOSIS — Z9221 Personal history of antineoplastic chemotherapy: Secondary | ICD-10-CM

## 2018-10-11 MED ORDER — CIPROFLOXACIN HCL 500 MG PO TABS: 500.0000 mg | ORAL_TABLET | Freq: Two times a day (BID) | ORAL | 0 refills | Status: DC

## 2018-10-11 NOTE — Assessment & Plan Note (Signed)
Metastatic breast cancer: recently progressive in liver and bone, also pleura.  Faslodex + Ibrance beginning 03/31/2016-March 2020 stopped due to progression Exemestane with everolimus discontinued 08/29/2018 due to hyperglycemia  [facility] molecular testing: Androgen receptor positive, PDL 1 neg, MSI stable, NTRK1/2/3Neg, T MBB intermediate 9 mutations/Mb, ESR 1-, PI 3 CA negative, BRCA negative  CT CAP 07/18/2018: Progressive disease with enlargement of several masslike areas in the base of left hemithorax, increased number and size of hypovascular hepatic lesions(4.6 cm,1.7 cm, 2.4 cm, 2.7 cm) and bone metastases (sclerotic lesions throughout axial and appendicular skeleton similar in number but increased size).  Bone scan 07/18/2018: Uptake at multiple levels proximal right humerus, sternum, right iliac bone, lateral left fourth or fifth rib, inferior right scapula She will be scheduled for every 58-monthXgeva injections. Exemestane with everolimus started 3/6/2020discontinued 08/30/2018 due to severe hyperglycemia  Current treatment:  09/26/2018: Resumed exemestane with everolimus along with Faslodex and XDelton See

## 2018-10-12 MED FILL — AFINITOR 10 MG TABLET: 10 | 28 days supply | Qty: 28 | Fill #2

## 2018-10-13 ENCOUNTER — Ambulatory Visit: Payer: Medicare Other

## 2018-10-15 ENCOUNTER — Other Ambulatory Visit: Payer: Self-pay

## 2018-10-15 MED ORDER — METOPROLOL SUCCINATE ER 25 MG PO TB24: 25.0000 mg | ORAL_TABLET | Freq: Every day | ORAL | 3 refills | Status: AC

## 2018-10-15 MED ORDER — RIVAROXABAN 20 MG PO TABS: 20.0000 mg | ORAL_TABLET | Freq: Every day | ORAL | 2 refills | Status: DC

## 2018-10-15 MED FILL — EXEMESTANE 25 MG TABS: 25 | 30 days supply | Qty: 30 | Fill #3

## 2018-10-15 NOTE — Telephone Encounter (Signed)
Pt is a 73 yr old female who saw Afib clinic on 07/19/18. Last noted weight was 92Kg on 09/26/18. SCr on 09/26/18 was 0.83, CrCl is 67mL/min. Will refill Xarelto 20mg  QD.

## 2018-10-19 ENCOUNTER — Other Ambulatory Visit: Payer: Self-pay

## 2018-10-19 ENCOUNTER — Ambulatory Visit: Payer: Medicare Other | Admitting: Podiatry

## 2018-10-19 ENCOUNTER — Encounter: Payer: Self-pay | Admitting: Podiatry

## 2018-10-19 VITALS — Temp 97.5°F

## 2018-10-19 DIAGNOSIS — L03031 Cellulitis of right toe: Secondary | ICD-10-CM

## 2018-10-24 NOTE — Progress Notes (Signed)
Subjective:   Patient ID: Brittany Porter, female   DOB: 73 y.o.   MRN: [id number]   HPI Patient presents stating my right big toe has been irritated and I wanted to get it checked.  Patient states it is been this way for couple weeks and she is not noted drainage and it seems to be improved but she was still concerned.  Patient does not smoke likes to be active   Review of Systems  All other systems reviewed and are negative.       Objective:  Physical Exam Vitals signs and nursing note reviewed.  Constitutional:      Appearance: She is well-developed.  Pulmonary:     Effort: Pulmonary effort is normal.  Musculoskeletal: Normal range of motion.  Skin:    General: Skin is warm.  Neurological:     Mental Status: She is alert.     Neurovascular status found to be mildly diminished but intact with patient found to have slight redness in the right hallux lateral border that is localized with no drainage noted and minimal discomfort currently.  She does have diabetes under reasonably good control but does need to do a better job     Assessment:  At risk patient with low-grade paronychia infection     Plan:  Since it seems to be improving and after H&P I did just debride some tissue and advised on soaks and if it were to worsen become painful or start to create more redness patient will be seen back immediately.  Gave diabetic education today to patient

## 2018-11-05 ENCOUNTER — Other Ambulatory Visit: Payer: Self-pay

## 2018-11-05 ENCOUNTER — Encounter (HOSPITAL_COMMUNITY): Payer: Self-pay | Admitting: Nurse Practitioner

## 2018-11-05 ENCOUNTER — Ambulatory Visit (HOSPITAL_COMMUNITY)
Admission: RE | Admit: 2018-11-05 | Discharge: 2018-11-05 | Disposition: A | Discharge status: Home / Self Care | Payer: Medicare Other | Source: Ambulatory Visit | Attending: Nurse Practitioner | Admitting: Nurse Practitioner

## 2018-11-05 VITALS — BP 118/58 | HR 88 | Ht 63.0 in | Wt 184.8 lb

## 2018-11-05 DIAGNOSIS — I4819 Other persistent atrial fibrillation: Secondary | ICD-10-CM

## 2018-11-05 DIAGNOSIS — M1990 Unspecified osteoarthritis, unspecified site: Secondary | ICD-10-CM | POA: Insufficient documentation

## 2018-11-05 DIAGNOSIS — I4891 Unspecified atrial fibrillation: Secondary | ICD-10-CM | POA: Diagnosis present

## 2018-11-05 DIAGNOSIS — Z7901 Long term (current) use of anticoagulants: Secondary | ICD-10-CM | POA: Insufficient documentation

## 2018-11-05 DIAGNOSIS — Z794 Long term (current) use of insulin: Secondary | ICD-10-CM | POA: Insufficient documentation

## 2018-11-05 DIAGNOSIS — I483 Typical atrial flutter: Secondary | ICD-10-CM | POA: Insufficient documentation

## 2018-11-05 DIAGNOSIS — E119 Type 2 diabetes mellitus without complications: Secondary | ICD-10-CM | POA: Diagnosis not present

## 2018-11-05 DIAGNOSIS — Z79899 Other long term (current) drug therapy: Secondary | ICD-10-CM | POA: Diagnosis not present

## 2018-11-05 LAB — TSH: TSH: 2.122 u[IU]/mL (ref 0.350–4.500)

## 2018-11-05 NOTE — Assessment & Plan Note (Signed)
Metastatic breast cancer: recently progressive in liver and bone, also pleura.  Faslodex + Ibrance beginning 03/31/2016-March 2020 stopped due to progression Exemestane with everolimus discontinued 08/29/2018 due to hyperglycemia  [facility] molecular testing: Androgen receptor positive, PDL 1 neg, MSI stable, NTRK1/2/3Neg, T MBB intermediate 9 mutations/Mb, ESR 1-, PI 3 CA negative, BRCA negative  CT CAP 07/18/2018: Progressive disease with enlargement of several masslike areas in the base of left hemithorax, increased number and size of hypovascular hepatic lesions(4.6 cm,1.7 cm, 2.4 cm, 2.7 cm) and bone metastases (sclerotic lesions throughout axial and appendicular skeleton similar in number but increased size).  Bone scan 07/18/2018: Uptake at multiple levels proximal right humerus, sternum, right iliac bone, lateral left fourth or fifth rib, inferior right scapula She will be scheduled for every 48-monthXgeva injections. Exemestane with everolimus started 3/6/2020discontinued 08/30/2018 due to severe hyperglycemia  Current treatment: 09/26/2018: Resumed exemestane with everolimus along with Faslodex and Xgeva Patient's blood sugars are all over the place ranging from 150-350.  She wishes to stay on everolimus as long as she can control her sugars.

## 2018-11-05 NOTE — Progress Notes (Signed)
Primary Care Physician: [name], MD Referring Physician: [facility] ER f/u   Brittany Porter is a 73 y.o. female with a h/o Breast CA with  mets to the [place] found 03/31/16, on Faslodex and Ibrance, DM, that was in the ER, 01/09/18, for new onset of atrial flutter with RVR. V rate was around 170 bpm. The pt did not feel the episode but it was picked up on routine check of V/S. She  was not started on anticoagulation for concerns of her therapy for h/o breast cancer. CHA2DS2VASc score was 3. She was found to be in typical atrial flutter in the ER. When I initially  saw her 01/16/18,  she was in rhythm.  No tobacco, alcohol, snoring. Is sedentary and obese. She was  started on eliquis.  She saw Dr. Rayann Heman in f/u but was not interested in a flutter ablation. She returned to the ER, 06/06/18 and had a cardioversion for afib with RVic and found to have   afib with RVR around 170 bpm. BP  soft at 673 asystolic. She was tolerating well but discussed with  Dr. Caryl Comes, that saw her in the clinic as well today , and discussed that  hospitalization would be probably be best way to further treat arrhythmia. She is in agreement.  F/u in afib clinic. 3/5. She continued on amiodarone 200 mg daily. She is in SR and has not noted any afib. She has no complaints.  F/u  6/22, she remains in SR. Is taking a chemo drug for her mets that pt states is aggravating her BS. Otherwise, no complaints, has not noted any afib.  Today, she denies symptoms of palpitations, chest pain, shortness of breath, orthopnea, PND, lower extremity edema, dizziness, presyncope, syncope, or neurologic sequela. The patient is tolerating medications without difficulties and is otherwise without complaint today.   Past Medical History:  Diagnosis Date  . Arthritis   . Breast cancer ([place])   . Diabetes mellitus   . Endometrial carcinoma ([place]) 10/2008  . Family history of breast cancer   . Family history of colon cancer   . Family history of  skin cancer   . Fluid overload   . History of radiation therapy 04/13/2009, 04/16/2009, 04/27/2009, 05/07/2009, 05/18/2009   3000 cGy to proximal vagina  . Hyperlipidemia   . Iron deficiency   . Melanoma ([place]) 2010   rt arm and back  . Ovarian cancer ([place])   . Pleural effusion 04/2010   Past Surgical History:  Procedure Laterality Date  . ABDOMINAL HYSTERECTOMY  10/2008  . INSERTION OF MESH  05/28/2012   Procedure: INSERTION OF MESH;  Surgeon: Adin Hector, MD;  Location: [place];  Service: General;  Laterality: N/A;  . KNEE ARTHROSCOPY  04/2010   Right knee  . LAPAROSCOPIC LYSIS OF ADHESIONS  05/28/2012   Procedure: LAPAROSCOPIC LYSIS OF ADHESIONS;  Surgeon: Adin Hector, MD;  Location: [place];  Service: General;  Laterality: N/A;  . MASTECTOMY  1997   Bilateral with lymph nodes  . Melanoma removal  02/2009, 07/2009  . VENTRAL HERNIA REPAIR  05/28/2012   Procedure: LAPAROSCOPIC VENTRAL HERNIA;  Surgeon: Adin Hector, MD;  Location: [place];  Service: General;  Laterality: N/A;    Current Outpatient Medications  Medication Sig Dispense Refill  . AFINITOR 10 MG tablet     . amiodarone (PACERONE) 200 MG tablet Take 1 tablet (200 mg total) by mouth daily. START by taking '200mg'$  (1 tablet) twice daily,  THEN reduce to '200mg'$  (1 tablet) once daily 42 tablet 3  . Calcium Carb-Cholecalciferol (CALCIUM 600+D3) 600-800 MG-UNIT TABS Take 1 tablet by mouth 2 (two) times daily.     . Cholecalciferol (VITAMIN D3) 2000 UNITS TABS Take 2,000 mg by mouth daily.    Marland Kitchen exemestane (AROMASIN) 25 MG tablet Take 1 tablet (25 mg total) by mouth daily after breakfast. 90 tablet 3  . fish oil-omega-3 fatty acids 1000 MG capsule Take 1 g by mouth 2 (two) times daily.     . furosemide (LASIX) 20 MG tablet Take 20 mg by mouth daily as needed for fluid or edema. Reported on 05/07/2015    . gabapentin (NEURONTIN) 100 MG capsule Take 100 mg by mouth 2 (two) times daily.    . insulin glargine (LANTUS) 100 UNIT/ML  injection Inject 24 Units into the skin at bedtime.    . insulin lispro protamine-lispro (HUMALOG 50/50 MIX) (50-50) 100 UNIT/ML SUSP injection Inject 4-10 Units into the skin 2 (two) times daily before a meal.    . Magnesium Oxide (MAG-OXIDE) 200 MG TABS Take 1 tablet (200 mg total) by mouth daily. (Patient taking differently: Take 200 mg by mouth at bedtime. ) 30 tablet 0  . metFORMIN (GLUCOPHAGE) 500 MG tablet Take 2 tablets (1,000 mg total) by mouth 2 (two) times daily with a meal for 30 days. 120 tablet 0  . metoprolol succinate (TOPROL-XL) 25 MG 24 hr tablet Take 1 tablet (25 mg total) by mouth daily. 90 tablet 3  . ondansetron (ZOFRAN) 8 MG tablet Take 1 tablet (8 mg total) by mouth 2 (two) times daily as needed (Nausea or vomiting). 30 tablet 1  . prochlorperazine (COMPAZINE) 10 MG tablet Take 1 tablet (10 mg total) by mouth every 6 (six) hours as needed (Nausea or vomiting). 30 tablet 1  . rivaroxaban (XARELTO) 20 MG TABS tablet Take 1 tablet (20 mg total) by mouth daily with supper. 90 tablet 2  . simvastatin (ZOCOR) 40 MG tablet Take 40 mg by mouth at bedtime.      . sodium chloride (OCEAN) 0.65 % SOLN nasal spray Place 1 spray into both nostrils as needed for congestion.    Marland Kitchen dexamethasone (DECADRON) 0.5 MG/5ML solution Swish 46m in mouth for 278m and spit out. Use 4 times daily for 8 weeks, start with Afinitor. Avoid eating/drinking for 1hr after rinse. 500 mL 3  . ferrous sulfate 325 (65 FE) MG tablet Take 325 mg by mouth daily as needed (energy).     . Marland Kitchenbuprofen (ADVIL,MOTRIN) 200 MG tablet Take 400 mg by mouth every 6 (six) hours as needed for headache (pain). Reported on 11/12/2015    . lidocaine-prilocaine (EMLA) cream Apply to affected area once 30 g 3  . naproxen sodium (ALEVE) 220 MG tablet Take 220 mg by mouth daily as needed (pain).     No current facility-administered medications for this encounter.     Allergies  Allergen Reactions  . Prednisolone Shortness Of Breath and  Other (See Comments)     increased BP  . Prednisone Anaphylaxis  . Sulfa Antibiotics Swelling    "Eyes swelled shut" - reaction to eye drops    Social History   Socioeconomic History  . Marital status: Married    Spouse name: Not on file  . Number of children: Not on file  . Years of education: Not on file  . Highest education level: Not on file  Occupational History  . Not on file  Social  Needs  . Financial resource strain: Not on file  . Food insecurity    Worry: Not on file    Inability: Not on file  . Transportation needs    Medical: Not on file    Non-medical: Not on file  Tobacco Use  . Smoking status: Never Smoker  . Smokeless tobacco: Never Used  Substance and Sexual Activity  . Alcohol use: No  . Drug use: No  . Sexual activity: Yes  Lifestyle  . Physical activity    Days per week: Not on file    Minutes per session: Not on file  . Stress: Not on file  Relationships  . Social Herbalist on phone: Not on file    Gets together: Not on file    Attends religious service: Not on file    Active member of club or organization: Not on file    Attends meetings of clubs or organizations: Not on file    Relationship status: Not on file  . Intimate partner violence    Fear of current or ex partner: Not on file    Emotionally abused: Not on file    Physically abused: Not on file    Forced sexual activity: Not on file  Other Topics Concern  . Not on file  Social History Narrative  . Not on file    Family History  Problem Relation Age of Onset  . Stroke Mother   . Cancer Father        'voice box'  . Colon cancer Sister        dx >50  . Heart attack Brother   . Skin cancer Brother 15  . Cancer Sister        primary site unk, dx >50  . Stroke Brother   . Stroke Sister   . Colon cancer Sister 3  . Dementia Sister   . Breast cancer Sister        dx >5-, met to brain    ROS- All systems are reviewed and negative except as per the HPI above   Physical Exam: Vitals:   11/05/18 1012  BP: (!) 118/58  Pulse: 88  Weight: 83.8 kg  Height: '5\' 3"'$  (1.6 m)   Wt Readings from Last 3 Encounters:  11/05/18 83.8 kg  09/26/18 92 kg  09/05/18 90.3 kg    Labs: Lab Results  Component Value Date   NA 142 09/26/2018   K 3.9 09/26/2018   CL 104 09/26/2018   CO2 28 09/26/2018   GLUCOSE 198 (H) 09/26/2018   BUN 12 09/26/2018   CREATININE 0.83 09/26/2018   CALCIUM 8.4 (L) 09/26/2018   MG 2.1 06/12/2018   Lab Results  Component Value Date   INR 2.08 06/06/2018   Lab Results  Component Value Date   CHOL 167 08/09/2018   HDL 61 08/09/2018   LDLCALC 74 08/09/2018   TRIG 161 (H) 08/09/2018     GEN- The patient is well appearing, alert and oriented x 3 today.   Head- normocephalic, atraumatic Eyes-  Sclera clear, conjunctiva pink Ears- hearing intact Oropharynx- clear Neck- supple, no JVP Lymph- no cervical lymphadenopathy Lungs- Clear to ausculation bilaterally, normal work of breathing Heart- Regular rate and rhythm, no murmurs, rubs or gallops, PMI not laterally displaced GI- soft, NT, ND, + BS Extremities- no clubbing, cyanosis, or edema MS- no significant deformity or atrophy Skin- no rash or lesion Psych- euthymic mood, full affect Neuro- strength and sensation are  intact  EKG- NSR at 88 bpm, PR int 158 ms, qrs int 82 ms, qtc 442 ms Epic records reviewed    Assessment and Plan: 1. H/o of typical a flutter and afib with RVR  Successful cardioversion for afib with RVR 06/06/18 but ERAF Pt was admitted 06/11/18 for return of afib with RVR and she was loaded on amiodarone drip and spontaneously converted   Continue xarelto,CHA2DS2VASc score of 3   Continue metoprolol succinate 25 mg qd Continue with amiodarone 200 mg qd  Cmet recently checked in May with CA center with normal liver function TSH drawn today   Recheck in 6 months   Butch Penny C. Othniel Maret, [facility] [address]  [place], [place] [address] [phone]

## 2018-11-08 NOTE — Progress Notes (Signed)
Patient Care Team: Brittany Ada, MD as PCP - General (Family Medicine)  DIAGNOSIS:    ICD-10-CM   1. Carcinoma of right breast metastatic to liver [facility])  C50.911    C78.7     SUMMARY OF ONCOLOGIC HISTORY: Oncology History  Breast cancer metastasized to liver [facility])  1997 Initial Diagnosis   bilateral breast cancers 1997 treated with bilateral mastectomies and axillary node dissections, adjuvant chemotherapy and radiation.   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 BRCA reportedly normal.   08/25/2015 Relapse/Recurrence   new solitary liver lesion, biopsy showing metastatic ER PR + breast   09/25/2015 - 03/11/2016 Chemotherapy   Xeloda. Stopped due to progression of liver and bone metastases; pleural metastases   03/31/2016 - 07/19/2018 Anti-estrogen oral therapy   Foslodex started 03/31/16, currently monthly  Ibrance 124m 3 weeks on, 1 week off started 03/31/16 Xgeva started on 05/05/16 Letrozole daily added 08/01/17    03/20/2018 Genetic Testing   Genetic testing performed through [facility]'s Multi-Cancer reported out on 03/19/2018 showed no pathogenic mutations. The Multi-Cancer Panel offered by [facility] includes sequencing and/or deletion duplication testing of the following 91 genes: AIP, ALK, APC, ATM, AXIN2, BAP1, BARD1, BLM, BMPR1A, BRCA1, BRCA2, BRIP1, BUB1B, CASR, CDC73, CDH1, CDK4, CDKN1B, CDKN1C, CDKN2A, CEBPA, CEP57, CHEK2, CTNNA1, DICER1, DIS3L2, EGFR, ENG, EPCAM, FH, FLCN, GALNT12, GATA2, GPC3, GREM1, HOXB13, HRAS, KIT, MAX, MEN1, MET, MITF, MLH1, MLH3, MSH2, MSH3, MSH6, MUTYH, NBN, NF1, NF2, NTHL1, PALB2, PDGFRA, PHOX2B, PMS2, POLD1, POLE, POT1, PRKAR1A, PTCH1, PTEN, RAD50, RAD51C, RAD51D, RB1, RECQL4,  RET, RNF43, RPS20, RUNX1, SDHA, SDHAF2, SDHB, SDHC, SDHD, SMAD4, SMARCA4, SMARCB1, SMARCE1, STK11, SUFU, TERC, TERT, TMEM127, TP53, TSC1, TSC2, VHL, WRN, WT1  A variant of uncertain significance (VUS) in a gene called MET was also noted. c.[id number]>G (p.Thr557Ala)   04/08/2018 Miscellaneous   [facility] molecular testing: Androgen receptor positive, PDL 1 neg, MSI stable, NTRK1/2/3Neg, T MBB intermediate 9 mutations/Mb, ESR 1-, PI 3 CA negative, BRCA negative   07/20/2018 - 08/30/2018 Anti-estrogen oral therapy   Exemestane with everolimus.  Discontinued due to severe hyperglycemia   09/24/2018 -  Chemotherapy   Halaven day 1 day 8 every 3 weeks    Carcinoma of breast metastatic to bone ([facility]  03/27/2016 Initial Diagnosis   Carcinoma of breast metastatic to bone ([facility]   03/31/2016 -  Chemotherapy   Foslodex started 03/31/16, currently monthly  Ibrance 1258m3 weeks on, 1 week off started 03/31/16 Daily letrozole added 08/01/17    09/26/2018 -  Chemotherapy   The patient had eriBULin mesylate (HALAVEN) 2.8 mg in sodium chloride 0.9 % 100 mL chemo infusion, 1.4 mg/m2 = 2.8 mg, Intravenous,  Once, 0 of 4 cycles  for chemotherapy treatment.    Breast cancer metastasized to liver, unspecified laterality ([facility]  03/27/2016 Initial Diagnosis   Breast cancer metastasized to liver, unspecified laterality ([facility]    CHIEF COMPLIANT: Follow-up of metastatic breast cancer on exemestane with everolimus  INTERVAL HISTORY: Brittany HUCKABAs a [age].o. with above-mentioned history of metastatic breast cancerwith metastases to thebone,liver,and lungs whois currently on treatment withexemestane with everolimus with Xgeva and Faslodex. She presents to the clinic today for toxicity check and evaluation of her control of her blood  glucose level.   REVIEW OF SYSTEMS:   Constitutional: Denies fevers, chills or abnormal weight loss Eyes: Denies blurriness of vision Ears, nose, mouth, throat, and  face: Denies mucositis or sore throat Respiratory: Denies cough, dyspnea or wheezes Cardiovascular: Denies palpitation, chest discomfort Gastrointestinal: Denies nausea, heartburn or change in bowel habits Skin: Denies abnormal skin rashes Lymphatics: Denies new lymphadenopathy or easy bruising Neurological: Denies numbness, tingling or new weaknesses Behavioral/Psych: Mood is stable, no new changes  Extremities: No lower extremity edema Breast: denies any pain or lumps or nodules in either breasts All other systems were reviewed with the patient and are negative.  I have reviewed the past medical history, past surgical history, social history and family history with the patient and they are unchanged from previous note.  ALLERGIES:  is allergic to prednisolone; prednisone; and sulfa antibiotics.  MEDICATIONS:  Current Outpatient Medications  Medication Sig Dispense Refill   AFINITOR 10 MG tablet      amiodarone (PACERONE) 200 MG tablet Take 1 tablet (200 mg total) by mouth daily. START by taking 248m (1 tablet) twice daily, THEN reduce to 2051m(1 tablet) once daily 42 tablet 3   Calcium Carb-Cholecalciferol (CALCIUM 600+D3) 600-800 MG-UNIT TABS Take 1 tablet by mouth 2 (two) times daily.      Cholecalciferol (VITAMIN D3) 2000 UNITS TABS Take 2,000 mg by mouth daily.     dexamethasone (DECADRON) 0.5 MG/5ML solution Swish 1029mn mouth for 2mi7mnd spit out. Use 4 times daily for 8 weeks, start with Afinitor. Avoid eating/drinking for 1hr after rinse. 500 mL 3   exemestane (AROMASIN) 25 MG tablet Take 1 tablet (25 mg total) by mouth daily after breakfast. 90 tablet 3   ferrous sulfate 325 (65 FE) MG tablet Take 325 mg by mouth daily as needed (energy).      fish oil-omega-3 fatty acids 1000 MG capsule Take 1 g by mouth 2 (two) times daily.      furosemide (LASIX) 20 MG tablet Take 20 mg by mouth daily as needed for fluid or edema. Reported on 05/07/2015     gabapentin  (NEURONTIN) 100 MG capsule Take 100 mg by mouth 2 (two) times daily.     ibuprofen (ADVIL,MOTRIN) 200 MG tablet Take 400 mg by mouth every 6 (six) hours as needed for headache (pain). Reported on 11/12/2015     insulin glargine (LANTUS) 100 UNIT/ML injection Inject 24 Units into the skin at bedtime.     insulin lispro protamine-lispro (HUMALOG 50/50 MIX) (50-50) 100 UNIT/ML SUSP injection Inject 4-10 Units into the skin 2 (two) times daily before a meal.     lidocaine-prilocaine (EMLA) cream Apply to affected area once 30 g 3   Magnesium Oxide (MAG-OXIDE) 200 MG TABS Take 1 tablet (200 mg total) by mouth daily. (Patient taking differently: Take 200 mg by mouth at bedtime. ) 30 tablet 0   metFORMIN (GLUCOPHAGE) 500 MG tablet Take 2 tablets (1,000 mg total) by mouth 2 (two) times daily with a meal for 30 days. 120 tablet 0   metoprolol succinate (TOPROL-XL) 25 MG 24 hr tablet Take 1 tablet (25 mg total) by mouth daily. 90 tablet 3   naproxen sodium (ALEVE) 220 MG tablet Take 220 mg by mouth daily as needed (pain).     ondansetron (ZOFRAN) 8 MG tablet Take 1 tablet (8 mg total) by mouth 2 (two) times daily as needed (Nausea or vomiting). 30 tablet 1   prochlorperazine (COMPAZINE) 10 MG tablet Take 1 tablet (  10 mg total) by mouth every 6 (six) hours as needed (Nausea or vomiting). 30 tablet 1   rivaroxaban (XARELTO) 20 MG TABS tablet Take 1 tablet (20 mg total) by mouth daily with supper. 90 tablet 2   simvastatin (ZOCOR) 40 MG tablet Take 40 mg by mouth at bedtime.       sodium chloride (OCEAN) 0.65 % SOLN nasal spray Place 1 spray into both nostrils as needed for congestion.     No current facility-administered medications for this visit.     PHYSICAL EXAMINATION: ECOG PERFORMANCE STATUS: 1 - Symptomatic but completely ambulatory  Vitals:   11/09/18 1144  BP: (!) 157/67  Pulse: 94  Resp: 20  Temp: 99.4 F (37.4 C)  SpO2: 95%   Filed Weights   11/09/18 1144  Weight: 182 lb 8  oz (82.8 kg)    GENERAL: alert, no distress and comfortable SKIN: skin color, texture, turgor are normal, no rashes or significant lesions EYES: normal, Conjunctiva are pink and non-injected, sclera clear OROPHARYNX: no exudate, no erythema and lips, buccal mucosa, and tongue normal  NECK: supple, thyroid normal size, non-tender, without nodularity LYMPH: no palpable lymphadenopathy in the cervical, axillary or inguinal LUNGS: clear to auscultation and percussion with normal breathing effort HEART: regular rate & rhythm and no murmurs and no lower extremity edema ABDOMEN: abdomen soft, non-tender and normal bowel sounds MUSCULOSKELETAL: no cyanosis of digits and no clubbing  NEURO: alert & oriented x 3 with fluent speech, no focal motor/sensory deficits EXTREMITIES: No lower extremity edema  LABORATORY DATA:  I have reviewed the data as listed CMP Latest Ref Rng & Units 09/26/2018 09/05/2018 08/24/2018  Glucose 70 - 99 mg/dL 198(H) 308(H) 551(HH)  BUN 8 - 23 mg/dL '12 11 13  ' Creatinine 0.44 - 1.00 mg/dL 0.83 1.04 1.20(H)  Sodium 135 - 145 mmol/L 142 138 135  Potassium 3.5 - 5.1 mmol/L 3.9 4.9 4.1  Chloride 98 - 111 mmol/L 104 102 98  CO2 22 - 32 mmol/L '28 25 22  ' Calcium 8.9 - 10.3 mg/dL 8.4(L) 8.9 8.3(L)  Total Protein 6.5 - 8.1 g/dL 6.3(L) 6.4(L) -  Total Bilirubin 0.3 - 1.2 mg/dL 0.7 0.6 -  Alkaline Phos 38 - 126 U/L 62 75 -  AST 15 - 41 U/L 21 25 -  ALT 0 - 44 U/L 10 13 -    Lab Results  Component Value Date   WBC 7.5 11/09/2018   HGB 10.0 (L) 11/09/2018   HCT 32.3 (L) 11/09/2018   MCV 84.1 11/09/2018   PLT 230 11/09/2018   NEUTROABS 5.4 11/09/2018    ASSESSMENT & PLAN:  Breast cancer metastasized to liver [facility]) Metastatic breast cancer: recently progressive in liver and bone, also pleura.  Faslodex + Ibrance beginning 03/31/2016-March 2020 stopped due to progression Exemestane with everolimus discontinued 08/29/2018 due to hyperglycemia  [facility] molecular testing:  Androgen receptor positive, PDL 1 neg, MSI stable, NTRK1/2/3Neg, T MBB intermediate 9 mutations/Mb, ESR 1-, PI 3 CA negative, BRCA negative  CT CAP 07/18/2018: Progressive disease with enlargement of several masslike areas in the base of left hemithorax, increased number and size of hypovascular hepatic lesions(4.6 cm,1.7 cm, 2.4 cm, 2.7 cm) and bone metastases (sclerotic lesions throughout axial and appendicular skeleton similar in number but increased size).  Bone scan 07/18/2018: Uptake at multiple levels proximal right humerus, sternum, right iliac bone, lateral left fourth or fifth rib, inferior right scapula She will be scheduled for every 54-monthXgeva injections. Exemestane with everolimus started  3/6/2020discontinued 08/30/2018 due to severe hyperglycemia  Current treatment: 09/26/2018: Resumed exemestane with everolimus along with Faslodex and Xgeva Patient's blood sugars are all over the place ranging from 150-350.  Adverse effects: 1.  Uncontrolled blood sugars: In spite of insulin 2.  Severe fatigue and shortness of breath 3.  Feeling poorly  Based on these findings I discontinued everolimus. We will plan on obtaining scans in 1 month and follow-up after that to discuss treatment plan.  Potential treatment options include pembrolizumab immunotherapy given the tumor mutational burden of 9, androgen receptor antagonists all chemotherapy with Halaven.  Return to clinic in 1 month for follow-up with injections.  No orders of the defined types were placed in this encounter.  The patient has a good understanding of the overall plan. she agrees with it. she will call with any problems that may develop before the next visit here.  Nicholas Lose, MD 11/09/2018  Julious Oka Dorshimer am acting as scribe for Dr. Nicholas Lose.  I have reviewed the above documentation for accuracy and completeness, and I agree with the above.

## 2018-11-09 ENCOUNTER — Other Ambulatory Visit: Payer: Self-pay

## 2018-11-09 ENCOUNTER — Inpatient Hospital Stay: Payer: Medicare Other

## 2018-11-09 ENCOUNTER — Inpatient Hospital Stay (HOSPITAL_BASED_OUTPATIENT_CLINIC_OR_DEPARTMENT_OTHER): Payer: Medicare Other | Admitting: Hematology and Oncology

## 2018-11-09 ENCOUNTER — Other Ambulatory Visit: Payer: Self-pay | Admitting: Hematology and Oncology

## 2018-11-09 ENCOUNTER — Inpatient Hospital Stay: Payer: Medicare Other | Attending: Hematology and Oncology

## 2018-11-09 DIAGNOSIS — C7951 Secondary malignant neoplasm of bone: Secondary | ICD-10-CM | POA: Diagnosis not present

## 2018-11-09 DIAGNOSIS — C782 Secondary malignant neoplasm of pleura: Secondary | ICD-10-CM | POA: Insufficient documentation

## 2018-11-09 DIAGNOSIS — R0602 Shortness of breath: Secondary | ICD-10-CM

## 2018-11-09 DIAGNOSIS — C50919 Malignant neoplasm of unspecified site of unspecified female breast: Secondary | ICD-10-CM | POA: Diagnosis not present

## 2018-11-09 DIAGNOSIS — C50911 Malignant neoplasm of unspecified site of right female breast: Secondary | ICD-10-CM

## 2018-11-09 DIAGNOSIS — Z5111 Encounter for antineoplastic chemotherapy: Secondary | ICD-10-CM | POA: Diagnosis present

## 2018-11-09 DIAGNOSIS — R739 Hyperglycemia, unspecified: Secondary | ICD-10-CM

## 2018-11-09 DIAGNOSIS — Z794 Long term (current) use of insulin: Secondary | ICD-10-CM

## 2018-11-09 DIAGNOSIS — C787 Secondary malignant neoplasm of liver and intrahepatic bile duct: Secondary | ICD-10-CM

## 2018-11-09 DIAGNOSIS — R5383 Other fatigue: Secondary | ICD-10-CM

## 2018-11-09 LAB — CBC WITH DIFFERENTIAL (CANCER CENTER ONLY)
Abs Immature Granulocytes: 0.04 10*3/uL (ref 0.00–0.07)
Basophils Absolute: 0 10*3/uL (ref 0.0–0.1)
Basophils Relative: 0 %
Eosinophils Absolute: 0.1 10*3/uL (ref 0.0–0.5)
Eosinophils Relative: 2 %
HCT: 32.3 % — ABNORMAL LOW (ref 36.0–46.0)
Hemoglobin: 10 g/dL — ABNORMAL LOW (ref 12.0–15.0)
Immature Granulocytes: 1 %
Lymphocytes Relative: 16 %
Lymphs Abs: 1.2 10*3/uL (ref 0.7–4.0)
MCH: 26 pg (ref 26.0–34.0)
MCHC: 31 g/dL (ref 30.0–36.0)
MCV: 84.1 fL (ref 80.0–100.0)
Monocytes Absolute: 0.7 10*3/uL (ref 0.1–1.0)
Monocytes Relative: 9 %
Neutro Abs: 5.4 10*3/uL (ref 1.7–7.7)
Neutrophils Relative %: 72 %
Platelet Count: 230 10*3/uL (ref 150–400)
RBC: 3.84 MIL/uL — ABNORMAL LOW (ref 3.87–5.11)
RDW: 19.9 % — ABNORMAL HIGH (ref 11.5–15.5)
WBC Count: 7.5 10*3/uL (ref 4.0–10.5)
nRBC: 0 % (ref 0.0–0.2)

## 2018-11-09 LAB — CMP (CANCER CENTER ONLY)
ALT: 15 U/L (ref 0–44)
AST: 32 U/L (ref 15–41)
Albumin: 2.7 g/dL — ABNORMAL LOW (ref 3.5–5.0)
Alkaline Phosphatase: 76 U/L (ref 38–126)
Anion gap: 12 (ref 5–15)
BUN: 20 mg/dL (ref 8–23)
CO2: 24 mmol/L (ref 22–32)
Calcium: 7.7 mg/dL — ABNORMAL LOW (ref 8.9–10.3)
Chloride: 103 mmol/L (ref 98–111)
Creatinine: 1.96 mg/dL — ABNORMAL HIGH (ref 0.44–1.00)
GFR, Est AFR Am: 28 mL/min — ABNORMAL LOW (ref >60)
GFR, Estimated: 24 mL/min — ABNORMAL LOW (ref >60)
Glucose, Bld: 270 mg/dL — ABNORMAL HIGH (ref 70–99)
Potassium: 4.9 mmol/L (ref 3.5–5.1)
Sodium: 139 mmol/L (ref 135–145)
Total Bilirubin: 0.4 mg/dL (ref 0.3–1.2)
Total Protein: 7 g/dL (ref 6.5–8.1)

## 2018-11-09 MED ORDER — FULVESTRANT 250 MG/5ML IM SOLN
500.0000 mg | Freq: Once | INTRAMUSCULAR | Status: AC
  Administered 2018-11-09: 500 mg via INTRAMUSCULAR

## 2018-11-09 MED ORDER — FULVESTRANT 250 MG/5ML IM SOLN
INTRAMUSCULAR | Status: AC
  Filled 2018-11-09: qty 5

## 2018-11-09 NOTE — Patient Instructions (Signed)

## 2018-11-12 ENCOUNTER — Telehealth: Payer: Self-pay | Admitting: Hematology and Oncology

## 2018-11-12 NOTE — Telephone Encounter (Signed)
I talk with patient regarding schedule  

## 2018-11-14 MED FILL — EXEMESTANE 25 MG TABS: 25 | 30 days supply | Qty: 30 | Fill #4

## 2018-11-15 ENCOUNTER — Other Ambulatory Visit: Payer: Self-pay | Admitting: Physician Assistant

## 2018-11-20 ENCOUNTER — Other Ambulatory Visit: Payer: Self-pay | Admitting: Physician Assistant

## 2018-11-27 ENCOUNTER — Other Ambulatory Visit: Payer: Self-pay

## 2018-11-27 ENCOUNTER — Ambulatory Visit (INDEPENDENT_AMBULATORY_CARE_PROVIDER_SITE_OTHER): Payer: Medicare Other

## 2018-11-27 ENCOUNTER — Encounter: Payer: Self-pay | Admitting: Pulmonary Disease

## 2018-11-27 ENCOUNTER — Ambulatory Visit: Payer: Medicare Other | Admitting: Pulmonary Disease

## 2018-11-27 VITALS — BP 108/58 | HR 93 | Temp 98.5°F | Ht 63.5 in | Wt 181.0 lb

## 2018-11-27 DIAGNOSIS — R0602 Shortness of breath: Secondary | ICD-10-CM | POA: Diagnosis not present

## 2018-11-27 MED ORDER — COMBIVENT RESPIMAT 20-100 MCG/ACT IN AERS: 1.0000 | INHALATION_SPRAY | Freq: Four times a day (QID) | RESPIRATORY_TRACT | 6 refills | Status: AC

## 2018-11-27 MED ORDER — LEVOFLOXACIN 750 MG PO TABS: 750.0000 mg | ORAL_TABLET | Freq: Every day | ORAL | 0 refills | Status: DC

## 2018-11-27 NOTE — Progress Notes (Signed)
Subjective:   PATIENT ID: Brittany Porter GENDER: female DOB: 1945/10/15, MRN: [id number]   HPI  Chief Complaint  Patient presents with  . Consult    shortness of breath    Reason for Visit: New consult for shortness of breath  [name] is a 73 year old female never smoker with metastatic breast cancer to liver, bone and left pleura, history of endometrial carcinoma, hx melanoma, diabetes, atrial fibrillation on anticoagulation and chronic systolic heart failure who presents as a new pulmonary consult.  Of note, on initial RN assessment SpO2 81% on RA. She was placed with 2L with improvement 92%. She has never been on oxygen before.   Reviewed records faxed from Dr. Tamala Julian including 10/16/18 office note: She has been having gradually worsening shortness of breath since January.  In January she was evaluated for coughing and difficulty breathing. Improved on steroids however this worsens her blood sugars. She has been evaluated by Cardiology and optimized from their standpoint with PRN diuretics for her swelling.  She confirms that her dyspnea worsens with exertion. She feels short of breath with short distances such as walking down the hallway. She attributes these changes to Afinitor as she also suspects this chemotherapy affects her glucose levels. Denies palpitations, wheezing. Dyspnea improves with rest. Has not tried any bronchodilators. Has had occasional chills, no fevers.  She lives with her husband. They usually run errands together however since the pandemic, her children help with groceries. She also has limited her activity. She ambulates with walker or cane.   Social History: Never smoker  Environmental exposures:  Denies second smoke exposure. 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  Past Medical History:  Diagnosis Date  . Arthritis   . Breast  cancer ([place])   . Diabetes mellitus   . Endometrial carcinoma ([place]) 10/2008  . Family history of breast cancer   . Family history of colon cancer   . Family history of skin cancer   . Fluid overload   . History of radiation therapy 04/13/2009, 04/16/2009, 04/27/2009, 05/07/2009, 05/18/2009   3000 cGy to proximal vagina  . Hyperlipidemia   . Iron deficiency   . Melanoma ([place]) 2010   rt arm and back  . Ovarian cancer ([place])   . Pleural effusion 04/2010     Family History  Problem Relation Age of Onset  . Stroke Mother   . Cancer Father        'voice box'  . Colon cancer Sister        dx >50  . Heart attack Brother   . Skin cancer Brother 97  . Cancer Sister        primary site unk, dx >50  . Diabetes Brother   . Stroke Brother   . Stroke Sister   . ALS Sister   . Colon cancer Sister 90  . Dementia Sister   . Breast cancer Sister        dx >5-, met to brain     Social History   Occupational History  . Not on file  Tobacco Use  . Smoking status: Never Smoker  . Smokeless tobacco: Never Used  Substance and Sexual Activity  . Alcohol use: No  . Drug use: No  . Sexual activity: Yes    Allergies  Allergen Reactions  . Prednisolone Shortness Of Breath and Other (See Comments)     increased BP  .  Prednisone Anaphylaxis  . Sulfa Antibiotics Swelling    "Eyes swelled shut" - reaction to eye drops     Outpatient Medications Prior to Visit  Medication Sig Dispense Refill  . amiodarone (PACERONE) 200 MG tablet START BY TAKING 1 TABLET BY MOUTH TWICE DAILY, THEN REDUCE TO 1 TABLET BY MOUTH ONCE DAILY 90 tablet 3  . Calcium Carb-Cholecalciferol (CALCIUM 600+D3) 600-800 MG-UNIT TABS Take 1 tablet by mouth 2 (two) times daily.     . Cholecalciferol (VITAMIN D3) 2000 UNITS TABS Take 2,000 mg by mouth daily.    Marland Kitchen exemestane (AROMASIN) 25 MG tablet Take 1 tablet (25 mg total) by mouth daily after breakfast. 90 tablet 3  . ferrous sulfate 325 (65 FE) MG tablet Take 325 mg by  mouth daily as needed (energy).     . fish oil-omega-3 fatty acids 1000 MG capsule Take 1 g by mouth 2 (two) times daily.     . furosemide (LASIX) 20 MG tablet Take 20 mg by mouth daily as needed for fluid or edema. Reported on 05/07/2015    . gabapentin (NEURONTIN) 100 MG capsule Take 100 mg by mouth 2 (two) times daily.    Marland Kitchen ibuprofen (ADVIL,MOTRIN) 200 MG tablet Take 400 mg by mouth every 6 (six) hours as needed for headache (pain). Reported on 11/12/2015    . insulin glargine (LANTUS) 100 UNIT/ML injection Inject 24 Units into the skin at bedtime.    . insulin lispro protamine-lispro (HUMALOG 50/50 MIX) (50-50) 100 UNIT/ML SUSP injection Inject 4-10 Units into the skin 2 (two) times daily before a meal.    . lidocaine-prilocaine (EMLA) cream Apply to affected area once 30 g 3  . Magnesium Oxide (MAG-OXIDE) 200 MG TABS Take 1 tablet (200 mg total) by mouth daily. (Patient taking differently: Take 200 mg by mouth at bedtime. ) 30 tablet 0  . metFORMIN (GLUCOPHAGE) 500 MG tablet Take 2 tablets (1,000 mg total) by mouth 2 (two) times daily with a meal for 30 days. 120 tablet 0  . metoprolol succinate (TOPROL-XL) 25 MG 24 hr tablet Take 1 tablet (25 mg total) by mouth daily. 90 tablet 3  . naproxen sodium (ALEVE) 220 MG tablet Take 220 mg by mouth daily as needed (pain).    . ondansetron (ZOFRAN) 8 MG tablet Take 1 tablet (8 mg total) by mouth 2 (two) times daily as needed (Nausea or vomiting). 30 tablet 1  . prochlorperazine (COMPAZINE) 10 MG tablet Take 1 tablet (10 mg total) by mouth every 6 (six) hours as needed (Nausea or vomiting). 30 tablet 1  . rivaroxaban (XARELTO) 20 MG TABS tablet Take 1 tablet (20 mg total) by mouth daily with supper. 90 tablet 2  . simvastatin (ZOCOR) 40 MG tablet Take 40 mg by mouth at bedtime.      . sodium chloride (OCEAN) 0.65 % SOLN nasal spray Place 1 spray into both nostrils as needed for congestion.     No facility-administered medications prior to visit.      Review of Systems  Constitutional: Negative for chills, diaphoresis, fever, malaise/fatigue and weight loss.  HENT: Negative for congestion, ear pain and sore throat.   Respiratory: Positive for cough, sputum production and shortness of breath. Negative for hemoptysis and wheezing.   Cardiovascular: Negative for chest pain, palpitations and leg swelling.  Gastrointestinal: Negative for abdominal pain, heartburn and nausea.  Genitourinary: Negative for frequency.  Musculoskeletal: Negative for joint pain and myalgias.  Skin: Negative for itching and rash.  Neurological:  Negative for dizziness, weakness and headaches.  Endo/Heme/Allergies: Does not bruise/bleed easily.  Psychiatric/Behavioral: Negative for depression. The patient is not nervous/anxious.      Objective:   Vitals:   11/27/18 0927  BP: (!) 108/58  Pulse: 93  Temp: 98.5 F (36.9 C)  TempSrc: Oral  SpO2: 91%  Weight: 181 lb (82.1 kg)  Height: 5' 3.5" (1.613 m)   SpO2: 91 % O2 Device: Nasal cannula O2 Flow Rate (L/min): 2 L/min  Physical Exam: General: Chronically ill-appearing, pale, no acute distress HENT: [place], AT Eyes: EOMI, no scleral icterus Respiratory: Clear to auscultation bilaterally. Decreased left basilar breath sounds. No crackles, wheezing or rales Cardiovascular: RRR, -M/R/G, no JVD GI: BS+, soft, nontender Extremities:-Edema,-tenderness Neuro: AAO x4, CNII-XII grossly intact Skin: Intact, no rashes or bruising Psych: Normal mood, normal affect  Data Reviewed:  Imaging: CT CAP 07/17/18 - Progressive liver cancer with increased number of hepatic and osseous lesions. Left basilar opacities/atelectasis with surrounding thickened pleural with calcification  CXR 11/27/18 - Compared to prior imaging, interval developed of mild ground glass opacity on right side, left pleural thickening/atelectasis unchanged, cardiomegaly similar  PFT: None on file  Labs: CBC and CMP 11/09/18 reviewed. Significant  for Hg 10 and Cr 1.96  TTE: 01/30/18-EF 45-50%, diffuse hypokinesis including inferolateral myocardium, diastolic dysfunction. Normal valves and RV size.  Oxygen Titration 11/27/18 SATURATION QUALIFICATIONS: (This note is used to comply with regulatory documentation for home oxygen)  Patient Saturations on Room Air at Rest = 93%  Patient Saturations on Room Air while Ambulating = 81%  Patient Saturations on 4 Liters of oxygen while Ambulating = 94%  Imaging, labs and tests noted above have been reviewed independently by me.    Assessment & Plan:   Discussion: 73 year old female never smoker with metastatic breast cancer to liver, bone and left pleura, history of endometrial carcinoma, hx melanoma, diabetes, atrial fibrillation on anticoagulation and chronic systolic heart failure who presents with shortness of breath x 6 months.  Differential for her acute hypoxemic respiratory failure includes infection, obstructive/restrictive lung process, heart failure exacerbation (doubt as patient euvolemic). STAT CXR ordered and reviewed in-clinic concerning for infectious process. Will also follow-up further chest imaging ordered by Oncology scheduled on 7/20. I will review imaging once it results to rule out parenchymal lung issues as she is at risk with her chemotherapy exposure. PE is considered however I will defer CTA as patient is already on anticoagulation and obtaining contrast imaging would not change management. She also has CKD so risks outweigh benefits for that reason as well. Will also evaluate with PFTs. We also discussed that deconditioning could contribute to her symptoms and that regular exercise may help alleviate her symptoms.  Acute hypoxemic respiratory failure Shortness of breath  Medications ordered:      Oxygen therapy. Recommend oxygen level goal >90%. We will send home with our office oxygen tank and arrange for DME company to pick up and continue supply once she is  established. Education provided by [employer].      Combivent q4h as needed      Levofloxacin for five day course  Tests ordered or pending:      CT Chest scheduled on 7/20      Pulmonary function test before next visit      Ambulatory O2 in-office: Recommend 4L with exertion and sleep  Encourage regular activity as tolerated  Health Maintenance Pneumovax 01/25/19 Influenza 02/08/18  Greater than 50% of this patient 80-minute office visit was spent  face-to-face in counseling with the patient/family. We discussed medical diagnosis and treatment plan as noted.  Orders Placed This Encounter  Procedures  . DG Chest 2 View    Standing Status:   Future    Number of Occurrences:   1    Standing Expiration Date:   01/28/2020    Order Specific Question:   Reason for Exam (SYMPTOM  OR DIAGNOSIS REQUIRED)    Answer:   shortness of breath    Order Specific Question:   Preferred imaging location?    Answer:   Internal    Order Specific Question:   Radiology Contrast Protocol - do NOT remove file path    Answer:   [email or web address]  . Ambulatory Referral for DME    Referral Priority:   Routine    Referral Type:   Durable Medical Equipment Purchase    Number of Visits Requested:   1  . Pulmonary function test    Standing Status:   Future    Standing Expiration Date:   11/27/2019    Order Specific Question:   Where should this test be performed?    Answer:   [facility]    Order Specific Question:   Full PFT: includes the following: basic spirometry, spirometry pre & post bronchodilator, diffusion capacity (DLCO), lung volumes    Answer:   FULL PFT Without spirometry post bronchodilator   Meds ordered this encounter  Medications  . Ipratropium-Albuterol (COMBIVENT RESPIMAT) 20-100 MCG/ACT AERS respimat    Sig: Inhale 1 puff into the lungs every 6 (six) hours.    Dispense:  4 g    Refill:  6  . levofloxacin (LEVAQUIN) 750 MG tablet    Sig: Take 1  tablet (750 mg total) by mouth daily.    Dispense:  7 tablet    Refill:  0    Return in about 1 month (around 12/28/2018).  Proctor, MD [facility] 11/27/2018 10:59 AM  Office Number [phone]

## 2018-11-27 NOTE — Patient Instructions (Addendum)
Acute hypoxemic respiratory failure  Continue oxygen for oxygen level goal >90%. We will arrange for DME company to supply home oxygen tanks.  Take antibiotics as prescribed for pneumonia  Keep your appointment for CT Chest. I will review images once they result.  Pulmonary function test ordered   Deconditioning Deconditioning refers to the changes in your body that occur during a period of inactivity. The changes happen in your heart, lungs, and muscles. They decrease your ability to be active, and they make you feel tired and weak. There are three stages of deconditioning:  Mild deconditioning. At this stage, you will notice a change in your ability to do your usual exercise activities, such as running, biking, or swimming.  Moderate deconditioning. At this stage, you will notice a change in your ability to do normal everyday activities, such as walking, grocery shopping, and doing chores.  Severe deconditioning. At this stage, you will notice a change in your ability to do minimal activity or normal self-care. Deconditioning can occur after only a few days of inactivity. The longer the period of inactivity, the more severe the deconditioning will be, and the longer it will take to return to your previous level of functioning. What are the causes? Deconditioning is often caused by inactivity due to:  Illnesses, such as cancer, stroke, heart attack, fibromyalgia, and chronic fatigue syndrome.  Injuries, especially back injuries, broken bones, and ligament and tendon injuries.  A long stay in the hospital.  Pregnancy, especially if long periods of bed rest are needed. What increases the risk? This condition is more likely to develop in:  People who are hospitalized.  People on bed rest.  People who are obese.  People with poor nutrition.  Elderly adults.  People with injuries or illnesses that interfere with movement and activity. What are the signs or symptoms? Symptoms  of deconditioning include:  Weakness.  Tiredness.  Shortness of breath with minor exertion.  A faster-than-normal heartbeat. You may not notice this without taking your pulse.  Pain or discomfort with activity.  Decreased strength.  Decreased sense of balance.  Decreased endurance.  Difficulty doing your usual forms of exercise.  Difficulty doing activities of daily living, such as grocery shopping or chores.  Difficulty walking around the house and doing basic self-care, such as getting to the bathroom, preparing meals, or doing laundry. How is this diagnosed? Deconditioning is diagnosed based on your medical history and a physical exam. During the physical exam, your health care provider will check for signs of deconditioning, such as:  Decreased size of muscles.  Decreased strength.  Trouble with balance.  Shortness of breath or abnormally increased heart rate after minor exertion. How is this treated? Treatment for deconditioning usually involves following a structured exercise program in which activity is increased gradually. Your health care provider will determine which exercises are right for you. The exercise program will likely include aerobic exercise and strength training:  Aerobic exercise helps improve the functioning of the heart and lungs as well as the muscles.  Strength training helps improve muscle size and strength. Both of these types of exercise will improve your endurance. You may be referred to a physical therapist who can create a safe strengthening program for you to follow. Follow these instructions at home:  Follow the exercise program that is recommended by your health care provider or physical therapist.  Do not increase your exercise any faster than directed.  Eat a healthy diet.  Do not use any products  that contain nicotine or tobacco, such as cigarettes and e-cigarettes. If you need help quitting, ask your health care provider.  Take  over-the-counter and prescription medicines only as told by your health care provider.  Keep all follow-up visits as told by your health care provider. This is important. Contact a health care provider if:  You are not able to carry out the prescribed exercise program.  You are becoming more and more fatigued and weak.  You become light-headed when rising to a sitting or standing position.  Your level of endurance decreases after it has improved. Get help right away if:  You have chest pain.  You are very short of breath.  You have any episodes of passing out. This information is not intended to replace advice given to you by your health care provider. Make sure you discuss any questions you have with your health care provider. Document Released: 09/16/2013 Document Revised: 04/14/2017 Document Reviewed: 08/01/2015 Elsevier Patient Education  2020 [employer].

## 2018-11-28 ENCOUNTER — Other Ambulatory Visit: Payer: Self-pay | Admitting: Internal Medicine

## 2018-11-28 ENCOUNTER — Telehealth: Payer: Self-pay

## 2018-11-28 MED ORDER — AMIODARONE HCL 200 MG PO TABS: ORAL_TABLET | ORAL | 0 refills | Status: DC

## 2018-11-28 NOTE — Telephone Encounter (Signed)
°*  STAT* If patient is at the pharmacy, call can be transferred to refill team.   1. Which medications need to be refilled? (please list name of each medication and dose if known) Amiodarone 200 mg  2. Which pharmacy/location (including street and city if local pharmacy) is medication to be sent to? [facility]   3. Do they need a 30 day or 90 day supply? 90 not sure

## 2018-11-28 NOTE — Telephone Encounter (Signed)
Amiodarone 200 mg twice daily refilled.

## 2018-11-29 ENCOUNTER — Other Ambulatory Visit: Payer: Self-pay | Admitting: *Deleted

## 2018-11-29 DIAGNOSIS — C50919 Malignant neoplasm of unspecified site of unspecified female breast: Secondary | ICD-10-CM

## 2018-12-03 ENCOUNTER — Ambulatory Visit (HOSPITAL_COMMUNITY)
Admission: RE | Admit: 2018-12-03 | Discharge: 2018-12-03 | Disposition: A | Discharge status: Home / Self Care | Payer: Medicare Other | Source: Ambulatory Visit | Attending: Hematology and Oncology | Admitting: Hematology and Oncology

## 2018-12-03 ENCOUNTER — Encounter (HOSPITAL_COMMUNITY)
Admission: RE | Admit: 2018-12-03 | Discharge: 2018-12-03 | Disposition: A | Discharge status: Home / Self Care | Payer: Medicare Other | Source: Ambulatory Visit | Attending: Hematology and Oncology | Admitting: Hematology and Oncology

## 2018-12-03 ENCOUNTER — Other Ambulatory Visit: Payer: Self-pay

## 2018-12-03 DIAGNOSIS — C787 Secondary malignant neoplasm of liver and intrahepatic bile duct: Secondary | ICD-10-CM | POA: Insufficient documentation

## 2018-12-03 DIAGNOSIS — C50911 Malignant neoplasm of unspecified site of right female breast: Secondary | ICD-10-CM | POA: Insufficient documentation

## 2018-12-03 DIAGNOSIS — C7951 Secondary malignant neoplasm of bone: Secondary | ICD-10-CM | POA: Diagnosis present

## 2018-12-03 DIAGNOSIS — C50919 Malignant neoplasm of unspecified site of unspecified female breast: Secondary | ICD-10-CM | POA: Diagnosis not present

## 2018-12-03 MED ORDER — TECHNETIUM TC 99M MEDRONATE IV KIT
20.3000 | PACK | Freq: Once | INTRAVENOUS | Status: AC | PRN
  Administered 2018-12-03: 20.3 via INTRAVENOUS

## 2018-12-03 NOTE — Telephone Encounter (Signed)
S/wpt she is aware that this will be virtual appt

## 2018-12-03 NOTE — Assessment & Plan Note (Addendum)
Metastatic breast cancer: recently progressive in liver and bone, also pleura.  Faslodex + Ibrance beginning 03/31/2016-March 2020 stopped due to progression Exemestane with everolimus discontinued 08/29/2018 due to hyperglycemia  [facility] molecular testing: Androgen receptor positive, PDL 1 neg, MSI stable, NTRK1/2/3Neg, T MBB intermediate 9 mutations/Mb, ESR 1-, PI 3 CA negative, BRCA negative  Current treatment:09/26/2018: Resumedexemestane with everolimus along with Faslodex and Xgeva Patient's blood sugars are all over the place ranging from 150-350.  Due to severe fatigue and feeling poorly we discontinued everolimus 11/09/2018  CT CAP and bone scan 12/03/2018: Stable liver metastases, progressing bone metastases and groundglass opacity in the right lung which is concerning for lymphangitic spread of the cancer.  I discussed with her the results of the scans and decided that she needs to start chemotherapy.  We discussed different options and decided to start Xeloda. I discussed risks and benefits of capecitabine which include diarrhea and hand-foot syndrome along with the cytopenias and LFT changes. I sent the prescription for 1500 mg p.o. twice daily 2 weeks on 1 week off. Patient understands that the goal of treatment is palliation.  She will come back in 3 weeks for toxicity evaluation.  

## 2018-12-04 ENCOUNTER — Telehealth: Payer: Self-pay | Admitting: Internal Medicine

## 2018-12-04 NOTE — Telephone Encounter (Signed)

## 2018-12-05 ENCOUNTER — Telehealth (INDEPENDENT_AMBULATORY_CARE_PROVIDER_SITE_OTHER): Payer: Medicare Other | Admitting: Internal Medicine

## 2018-12-05 VITALS — BP 105/53 | HR 77 | Ht 63.5 in | Wt 180.0 lb

## 2018-12-05 DIAGNOSIS — I5042 Chronic combined systolic (congestive) and diastolic (congestive) heart failure: Secondary | ICD-10-CM | POA: Diagnosis not present

## 2018-12-05 DIAGNOSIS — I2510 Atherosclerotic heart disease of native coronary artery without angina pectoris: Secondary | ICD-10-CM | POA: Diagnosis not present

## 2018-12-05 DIAGNOSIS — I4819 Other persistent atrial fibrillation: Secondary | ICD-10-CM

## 2018-12-05 DIAGNOSIS — I483 Typical atrial flutter: Secondary | ICD-10-CM | POA: Diagnosis not present

## 2018-12-05 NOTE — Progress Notes (Signed)
Virtual Visit via Telephone Note   This visit type was conducted due to national recommendations for restrictions regarding the COVID-19 Pandemic (e.g. social distancing) in an effort to limit this patient's exposure and mitigate transmission in our community.  Due to her co-morbid illnesses, this patient is at least at moderate risk for complications without adequate follow up.  This format is felt to be most appropriate for this patient at this time.  The patient did not have access to video technology/had technical difficulties with video requiring transitioning to audio format only (telephone).  All issues noted in this document were discussed and addressed.  No physical exam could be performed with this format.  Please refer to the patient's chart for her  consent to telehealth for [facility].   Date:  12/05/2018   ID:  Brittany Porter, DOB 12/20/1945, MRN [id number]  Patient Location: Home Provider Location: Home  PCP:  Brittany Ada, MD  Cardiologist:  No primary care provider on file.  Electrophysiologist:  None   Evaluation Performed:  Follow-Up Visit  Chief Complaint:  CAD, afib  History of Present Illness:    Brittany Porter is a 73 y.o. female who is well-known to my clinic as well as the atrial fibrillation clinic.  Shortly after our last visit she had an episode of rapid atrial fibrillation with a history of atrial flutter.  She was in the hospital for several days and was planned for cardioversion but chemically converted with amiodarone.  She continues on oral amiodarone without incident.  She is also continued on Xarelto for anticoagulation which she is tolerating well.  She was recently short of breath and prescribed levofloxacin which she has recently completed.  She feels slightly better with regard to shortness of breath but has required some oxygen therapy intermittently.  From a cardiovascular standpoint she feels that she is doing well.  The patient does not have  symptoms concerning for COVID-19 infection (fever, chills, cough, or new shortness of breath).    Past Medical History:  Diagnosis Date  . Arthritis   . Breast cancer ([place])   . Diabetes mellitus   . Endometrial carcinoma ([place]) 10/2008  . Family history of breast cancer   . Family history of colon cancer   . Family history of skin cancer   . Fluid overload   . History of radiation therapy 04/13/2009, 04/16/2009, 04/27/2009, 05/07/2009, 05/18/2009   3000 cGy to proximal vagina  . Hyperlipidemia   . Iron deficiency   . Melanoma ([place]) 2010   rt arm and back  . Ovarian cancer ([place])   . Pleural effusion 04/2010   Past Surgical History:  Procedure Laterality Date  . ABDOMINAL HYSTERECTOMY  10/2008  . APPENDECTOMY    . INSERTION OF MESH  05/28/2012   Procedure: INSERTION OF MESH;  Surgeon: Adin Hector, MD;  Location: [place];  Service: General;  Laterality: N/A;  . KNEE ARTHROSCOPY  04/2010   Right knee  . LAPAROSCOPIC LYSIS OF ADHESIONS  05/28/2012   Procedure: LAPAROSCOPIC LYSIS OF ADHESIONS;  Surgeon: Adin Hector, MD;  Location: [place];  Service: General;  Laterality: N/A;  . MASTECTOMY  1997   Bilateral with lymph nodes  . Melanoma removal  02/2009, 07/2009  . VENTRAL HERNIA REPAIR  05/28/2012   Procedure: LAPAROSCOPIC VENTRAL HERNIA;  Surgeon: Adin Hector, MD;  Location: [place];  Service: General;  Laterality: N/A;     Current Meds  Medication Sig  . amiodarone (  PACERONE) 200 MG tablet START BY TAKING 1 TABLET BY MOUTH TWICE DAILY, THEN REDUCE TO 1 TABLET BY MOUTH ONCE DAILY  . Calcium Carb-Cholecalciferol (CALCIUM 600+D3) 600-800 MG-UNIT TABS Take 1 tablet by mouth 2 (two) times daily.   . Cholecalciferol (VITAMIN D3) 2000 UNITS TABS Take 2,000 mg by mouth daily.  Marland Kitchen exemestane (AROMASIN) 25 MG tablet Take 1 tablet (25 mg total) by mouth daily after breakfast.  . ferrous sulfate 325 (65 FE) MG tablet Take 325 mg by mouth daily as needed (energy).   . fish oil-omega-3  fatty acids 1000 MG capsule Take 1 g by mouth 2 (two) times daily.   . furosemide (LASIX) 20 MG tablet Take 20 mg by mouth daily as needed for fluid or edema. Reported on 05/07/2015  . gabapentin (NEURONTIN) 100 MG capsule Take 100 mg by mouth 2 (two) times daily.  Marland Kitchen ibuprofen (ADVIL,MOTRIN) 200 MG tablet Take 400 mg by mouth every 6 (six) hours as needed for headache (pain). Reported on 11/12/2015  . insulin glargine (LANTUS) 100 UNIT/ML injection Inject 24 Units into the skin at bedtime.  . insulin lispro protamine-lispro (HUMALOG 50/50 MIX) (50-50) 100 UNIT/ML SUSP injection Inject 4-10 Units into the skin 2 (two) times daily before a meal.  . Ipratropium-Albuterol (COMBIVENT RESPIMAT) 20-100 MCG/ACT AERS respimat Inhale 1 puff into the lungs every 6 (six) hours.  . lidocaine-prilocaine (EMLA) cream Apply to affected area once  . Magnesium Oxide (MAG-OXIDE) 200 MG TABS Take 1 tablet (200 mg total) by mouth daily. (Patient taking differently: Take 200 mg by mouth at bedtime. )  . metoprolol succinate (TOPROL-XL) 25 MG 24 hr tablet Take 1 tablet (25 mg total) by mouth daily.  . naproxen sodium (ALEVE) 220 MG tablet Take 220 mg by mouth daily as needed (pain).  . ondansetron (ZOFRAN) 8 MG tablet Take 1 tablet (8 mg total) by mouth 2 (two) times daily as needed (Nausea or vomiting).  . OXYGEN Inhale into the lungs.  . prochlorperazine (COMPAZINE) 10 MG tablet Take 1 tablet (10 mg total) by mouth every 6 (six) hours as needed (Nausea or vomiting).  . rivaroxaban (XARELTO) 20 MG TABS tablet Take 1 tablet (20 mg total) by mouth daily with supper.  . simvastatin (ZOCOR) 40 MG tablet Take 40 mg by mouth at bedtime.    . sodium chloride (OCEAN) 0.65 % SOLN nasal spray Place 1 spray into both nostrils as needed for congestion.  . [DISCONTINUED] levofloxacin (LEVAQUIN) 750 MG tablet Take 1 tablet (750 mg total) by mouth daily.     Allergies:   Prednisolone, Prednisone, and Sulfa antibiotics   Social  History   Tobacco Use  . Smoking status: Never Smoker  . Smokeless tobacco: Never Used  Substance Use Topics  . Alcohol use: No  . Drug use: No     Family Hx: The patient's family history includes ALS in her sister; Breast cancer in her sister; Cancer in her father and sister; Colon cancer in her sister; Colon cancer (age of onset: 65) in her sister; Dementia in her sister; Diabetes in her brother; Heart attack in her brother; Skin cancer (age of onset: 79) in her brother; Stroke in her brother, mother, and sister.  ROS:   Please see the history of present illness.     All other systems reviewed and are negative.   Prior CV studies:   The following studies were reviewed today:    Labs/Other Tests and Data Reviewed:    EKG:  An ECG dated 11/05/2018 was personally reviewed today and demonstrated:  nsr  Recent Labs: 06/12/2018: Magnesium 2.1 08/24/2018: B Natriuretic Peptide 76.7 11/05/2018: TSH 2.122 11/09/2018: ALT 15; BUN 20; Creatinine 1.96; Hemoglobin 10.0; Platelet Count 230; Potassium 4.9; Sodium 139   Recent Lipid Panel Lab Results  Component Value Date/Time   CHOL 167 08/09/2018 11:14 AM   TRIG 161 (H) 08/09/2018 11:14 AM   HDL 61 08/09/2018 11:14 AM   CHOLHDL 2.7 08/09/2018 11:14 AM   LDLCALC 74 08/09/2018 11:14 AM    Wt Readings from Last 3 Encounters:  12/05/18 180 lb (81.6 kg)  11/27/18 181 lb (82.1 kg)  11/09/18 182 lb 8 oz (82.8 kg)     Objective:    Vital Signs:  BP (!) 105/53   Pulse 77   Ht 5' 3.5" (1.613 m)   Wt 180 lb (81.6 kg)   BMI 31.39 kg/m    VITAL SIGNS:  reviewed GEN:  no acute distress  ASSESSMENT & PLAN:    1. Coronary artery disease involving native coronary artery of native heart without angina pectoris   2. Typical atrial flutter ([place])   3. Chronic combined systolic and diastolic heart failure (HCC)   4. Other persistent atrial fibrillation    Atrial fibrillation/atrial flutter- she continues on amiodarone without incident.   She is in normal sinus rhythm.  She takes her pulse daily.  She follows with Roderic Palau and Dr. Rayann Heman in atrial fibrillation clinic, will continue to have her follow in A. fib clinic for continuity.  Reduced ejection fraction/coronary artery calcifications- she continues on medical therapy for coronary artery disease including metoprolol succinate, with the added benefit that this may assist with her reduced ejection fraction.  She is on simvastatin 40 mg daily.  She has no significant symptoms at this time, continue to aggressively medically manage as tolerated.  Patient has no other concerns at this time and is continuing to recover from a respiratory illness.  We will see the patient back in 6 months time, and I have encouraged her to contact me with any questions or concerns.  At that time we can certainly consider repeating an echocardiogram to evaluate LV function.   COVID-19 Education: The signs and symptoms of COVID-19 were discussed with the patient and how to seek care for testing (follow up with PCP or arrange E-visit).  The importance of social distancing was discussed today.  Time:   Today, I have spent 15 minutes with the patient with telehealth technology discussing the above problems.     Medication Adjustments/Labs and Tests Ordered: Current medicines are reviewed at length with the patient today.  Concerns regarding medicines are outlined above.   Tests Ordered: No orders of the defined types were placed in this encounter.   Medication Changes: No orders of the defined types were placed in this encounter.   Follow Up:  Virtual Visit or In Person in 6 month(s)  Signed, Elouise Munroe, MD  12/05/2018 12:26 PM    [place]

## 2018-12-05 NOTE — Patient Instructions (Signed)
Medication Instructions:  Your Physician recommend you continue on your current medication as directed.    If you need a refill on your cardiac medications before your next appointment, please call your pharmacy.   Lab work: None  Testing/Procedures: None  Follow-Up: At [facility], you and your health needs are our priority.  As part of our continuing mission to provide you with exceptional heart care, we have created designated Provider Care Teams.  These Care Teams include your primary Cardiologist (physician) and Advanced Practice Providers (APPs -  Physician Assistants and Nurse Practitioners) who all work together to provide you with the care you need, when you need it. You will need a follow up appointment in 6 months.  Please call our office 2 months in advance to schedule this appointment.  You may see Dr. Margaretann Loveless or one of the following Advanced Practice Providers on your designated Care Team:   Rosaria Ferries, PA-C . Jory Sims, DNP, ANP

## 2018-12-06 ENCOUNTER — Telehealth: Payer: Self-pay | Admitting: Pulmonary Disease

## 2018-12-06 NOTE — Telephone Encounter (Signed)
Pulmonary Telephone Encounter  Spoke with: Brittany Porter  Updated patient on my review of her CT scan which demonstrated interval development of diffuse ground glass opacities R>L with peribronchovascular consdoliation suggestive of infectious process. Radiology report also notes interval increase in size of left-sided pulmonary nodule next to aorta from 5.62mm>7mm. Remaining nodules noted and loculated left pleural effusion, remained similar in size  She reports her respiratory symptoms have improved after completing antibiotics. She also feels the combivent has been helping with her dyspnea. I will message staff to arrange for follow-up appointment in 6 weeks with PFTs before her appointment. Patient is ok with any date except for Thursdays (bible group study).  Rodman Pickle, M.D. [facility] Pulmonary/Critical Care Medicine

## 2018-12-06 NOTE — Progress Notes (Signed)
Patient Care Team: Brittany Ada, MD as PCP - General (Family Medicine)  DIAGNOSIS:    ICD-10-CM   1. 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 Breast cancer metastasized to multiple sites, unspecified laterality (Brittany Porter)  C50.919     SUMMARY OF ONCOLOGIC HISTORY: Oncology History  Breast cancer metastasized to liver Brittany Porter)  1997 Initial Diagnosis   bilateral breast cancers 1997 treated with bilateral mastectomies and axillary node dissections, adjuvant chemotherapy and radiation.   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 Brittany Porter reportedly normal.   08/25/2015 Relapse/Recurrence   new solitary liver lesion, biopsy showing metastatic ER PR + breast   09/25/2015 - 03/11/2016 Chemotherapy   Xeloda. Stopped due to progression of liver and bone metastases; pleural metastases   03/31/2016 - 07/19/2018 Anti-estrogen oral therapy   Foslodex started 03/31/16, currently monthly  Ibrance 151m 3 weeks on, 1 week off started 03/31/16 Xgeva started on 05/05/16 Letrozole daily added 08/01/17    03/20/2018 Genetic Testing   Genetic testing performed through [facility]'s Multi-Cancer reported out on 03/19/2018 showed no pathogenic mutations. The Multi-Cancer Panel offered by [facility] includes sequencing and/or deletion duplication testing of the following 91 genes: AIP, ALK, APC, ATM, AXIN2, BAP1, BARD1, BLM, BMPR1A, BRCA1, BRCA2, BRIP1, BUB1B, CASR, CDC73, CDH1, CDK4, CDKN1B, CDKN1C, CDKN2A, CEBPA, CEP57, CHEK2, CTNNA1, DICER1, DIS3L2, EGFR, ENG, EPCAM, FH, FLCN, GALNT12, GATA2, GPC3, GREM1, HOXB13, HRAS, KIT, MAX, MEN1, Brittany Porter, MITF, MLH1, MLH3, MSH2, MSH3, MSH6, MUTYH, NBN, NF1, NF2, NTHL1, PALB2, PDGFRA,  PHOX2B, PMS2, POLD1, POLE, POT1, PRKAR1A, PTCH1, PTEN, RAD50, RAD51C, RAD51D, RB1, RECQL4, RET, RNF43, RPS20, RUNX1, SDHA, SDHAF2, SDHB, SDHC, SDHD, SMAD4, SMARCA4, SMARCB1, SMARCE1, STK11, SUFU, TERC, TERT, TMEM127, TP53, TSC1, TSC2, VHL, WRN, WT1  A variant of uncertain significance (VUS) in a gene called Brittany Porter was also noted. 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  Discontinued due to severe hyperglycemia   09/24/2018 -  Chemotherapy   [place] day 1 day 8 every 3 weeks    Carcinoma of breast metastatic to bone ([place]  03/27/2016 Initial Diagnosis   [place]arcin[place] of breast metastatic to bone ([place]   03/31/2016 -  Chemotherapy   Foslodex started 03/31/16, currently monthly  Ibrance 1223m3 weeks on, 1 week off started 03/31/16 Daily letrozole added 08/01/17    09/26/2018 -  Chemotherapy   The patient had eriBULin mesylate (HALAVEN) 2.8 mg in sodium chloride 0.9 % 100 mL chemo infusion, 1.4 mg/m2 = 2.8 mg, Intravenous,  Once, 0 of 4 cycles  for chemotherapy treatment.    Breast cancer metastasized to liver, unspecified laterality ([place]  03/27/2016 Initial Diagnosis   Breast cancer metastasized to liver, unspecified laterality ([facility]    CHIEF COMPLIANT: Follow-up of metastatic breast cancer on exemestane to review recent scans   INTERVAL HISTORY: Brittany Porter a [age].o. with above-mentioned history of metastatic breast cancerwith metastases to thebone,liver,and lungs whois currently on treatment withexemestane with Xgeva and Faslodex. CT CAP  on 12/03/2018 revealed severe, diffuse interstitial and airspace process in the right lung consistent with infection or inflammatory process. There was increase in size of left  lower lobe pulmonary nodules, stable hepatic lesions, and stable osseous metastases. Bone scan on 12/03/18 showed progression of osseous metastases in the lumbar spine and left pelvis. She presents to the clinic today to review her recent scans and discuss further treatment.   REVIEW OF SYSTEMS:   Constitutional: Denies fevers, chills or abnormal weight loss Eyes: Denies blurriness of vision Ears, nose, mouth, throat, and face: Denies mucositis or sore throat Respiratory: Denies cough, dyspnea or wheezes Cardiovascular: Denies palpitation, chest discomfort Gastrointestinal: Denies nausea, heartburn or change in bowel habits Skin: Denies abnormal skin rashes Lymphatics: Denies new lymphadenopathy or easy bruising Neurological: Denies numbness, tingling or new weaknesses Behavioral/Psych: Mood is stable, no new changes  Extremities: No lower extremity edema Breast: denies any pain or lumps or nodules in either breasts All other systems were reviewed with the patient and are negative.  I have reviewed the past medical history, past surgical history, social history and family history with the patient and they are unchanged from previous note.  ALLERGIES:  is allergic to prednisolone; prednisone; and sulfa antibiotics.  MEDICATIONS:  Current Outpatient Medications  Medication Sig Dispense Refill   amiodarone (PACERONE) 200 MG tablet START BY TAKING 1 TABLET BY MOUTH TWICE DAILY, THEN REDUCE TO 1 TABLET BY MOUTH ONCE DAILY 180 tablet 0   Calcium Carb-Cholecalciferol (CALCIUM 600+D3) 600-800 MG-UNIT TABS Take 1 tablet by mouth 2 (two) times daily.      capecitabine (XELODA) 500 MG tablet Take 3 tablets (1,500 mg total) by mouth 2 (two) times daily after a meal. 84 tablet 3   Cholecalciferol (VITAMIN D3) 2000 UNITS TABS Take 2,000 mg by mouth daily.     ferrous sulfate 325 (65 FE) MG tablet Take 325 mg by mouth daily as needed (energy).      fish oil-omega-3 fatty acids 1000 MG capsule Take  1 g by mouth 2 (two) times daily.      furosemide (LASIX) 20 MG tablet Take 20 mg by mouth daily as needed for fluid or edema. Reported on 05/07/2015     gabapentin (NEURONTIN) 100 MG capsule Take 100 mg by mouth 2 (two) times daily.     ibuprofen (ADVIL,MOTRIN) 200 MG tablet Take 400 mg by mouth every 6 (six) hours as needed for headache (pain). Reported on 11/12/2015     Ipratropium-Albuterol (COMBIVENT RESPIMAT) 20-100 MCG/ACT AERS respimat Inhale 1 puff into the lungs every 6 (six) hours. 4 g 6   lidocaine-prilocaine (EMLA) cream Apply to affected area once 30 g 3   Magnesium Oxide (MAG-OXIDE) 200 MG TABS Take 1 tablet (200 mg total) by mouth daily. (Patient taking differently: Take 200 mg by mouth at bedtime. ) 30 tablet 0   metFORMIN (GLUCOPHAGE) 500 MG tablet Take 2 tablets (1,000 mg total) by mouth 2 (two) times daily with a meal for 30 days. 120 tablet 0   metoprolol succinate (TOPROL-XL) 25 MG 24 hr tablet Take 1 tablet (25 mg total) by mouth daily. 90 tablet 3   naproxen sodium (ALEVE) 220 MG tablet Take 220 mg by mouth daily as needed (pain).     ondansetron (ZOFRAN) 8 MG tablet Take 1 tablet (8 mg total) by mouth 2 (two) times daily as needed (Nausea or vomiting). 30 tablet 1   OXYGEN Inhale into the lungs.     prochlorperazine (COMPAZINE) 10 MG  tablet Take 1 tablet (10 mg total) by mouth every 6 (six) hours as needed (Nausea or vomiting). 30 tablet 1   rivaroxaban (XARELTO) 20 MG TABS tablet Take 1 tablet (20 mg total) by mouth daily with supper. 90 tablet 2   simvastatin (ZOCOR) 40 MG tablet Take 40 mg by mouth at bedtime.       sodium chloride (OCEAN) 0.65 % SOLN nasal spray Place 1 spray into both nostrils as needed for congestion.     No current facility-administered medications for this visit.     PHYSICAL EXAMINATION: ECOG PERFORMANCE STATUS: 2 - Symptomatic, <50% confined to bed  Vitals:   12/07/18 0857  BP: (!) 120/53  Pulse: 91  Resp: 18  Temp: 98.3 F  (36.8 C)  SpO2: 92%   Filed Weights   12/07/18 0857  Weight: 180 lb 1.6 oz (81.7 kg)    GENERAL: alert, no distress and comfortable SKIN: skin color, texture, turgor are normal, no rashes or significant lesions EYES: normal, Conjunctiva are pink and non-injected, sclera clear OROPHARYNX: no exudate, no erythema and lips, buccal mucosa, and tongue normal  NECK: supple, thyroid normal size, non-tender, without nodularity LYMPH: no palpable lymphadenopathy in the cervical, axillary or inguinal LUNGS: clear to auscultation and percussion with normal breathing effort HEART: regular rate & rhythm and no murmurs and no lower extremity edema ABDOMEN: abdomen soft, non-tender and normal bowel sounds MUSCULOSKELETAL: no cyanosis of digits and no clubbing  NEURO: alert & oriented x 3 with fluent speech, no focal motor/sensory deficits EXTREMITIES: No lower extremity edema  LABORATORY DATA:  I have reviewed the data as listed CMP Latest Ref Rng & Units 11/09/2018 09/26/2018 09/05/2018  Glucose 70 - 99 mg/dL 270(H) 198(H) 308(H)  BUN 8 - 23 mg/dL '20 12 11  ' Creatinine 0.44 - 1.00 mg/dL 1.96(H) 0.83 1.04  Sodium 135 - 145 mmol/L 139 142 138  Potassium 3.5 - 5.1 mmol/L 4.9 3.9 4.9  Chloride 98 - 111 mmol/L 103 104 102  CO2 22 - 32 mmol/L '24 28 25  ' Calcium 8.9 - 10.3 mg/dL 7.7(L) 8.4(L) 8.9  Total Protein 6.5 - 8.1 g/dL 7.0 6.3(L) 6.4(L)  Total Bilirubin 0.3 - 1.2 mg/dL 0.4 0.7 0.6  Alkaline Phos 38 - 126 U/L 76 62 75  AST 15 - 41 U/L 32 21 25  ALT 0 - 44 U/L '15 10 13    ' Lab Results  Component Value Date   WBC 7.5 11/09/2018   HGB 10.0 (L) 11/09/2018   HCT 32.3 (L) 11/09/2018   MCV 84.1 11/09/2018   PLT 230 11/09/2018   NEUTROABS 5.4 11/09/2018    ASSESSMENT & PLAN:  Breast cancer metastasized to liver [facility]) Metastatic breast cancer: recently progressive in liver and bone, also pleura.  Faslodex + Ibrance beginning 03/31/2016-March 2020 stopped due to progression Exemestane with  everolimus discontinued 08/29/2018 due to hyperglycemia  [facility] molecular testing: Androgen receptor positive, PDL 1 neg, MSI stable, NTRK1/2/3Neg, T MBB intermediate 9 mutations/Mb, ESR 1-, PI 3 CA negative, Brittany Porter negative  Current treatment:09/26/2018: Resumedexemestane with everolimus along with Faslodex and Xgeva Patient's blood sugars are all over the place ranging from 150-350.  Due to severe fatigue and feeling poorly we discontinued everolimus 11/09/2018  CT CAP and bone scan 12/03/2018: Stable liver metastases, progressing bone metastases and groundglass opacity in the right lung which is concerning for lymphangitic spread of the cancer.  I discussed with her the results of the scans and decided that she needs to start  chemotherapy.  We discussed different options and decided to start Xeloda.  I did not recommend immunotherapy because of the lung inflammation noted on the CT scans. Patient wanted to be aggressive and wanted to consider systemic chemotherapy if that was felt to be beneficial to her.  She is not interested in hospice/palliative care options.  I discussed risks and benefits of capecitabine which include diarrhea and hand-foot syndrome along with the cytopenias and LFT changes. I sent the prescription for 1500 mg p.o. twice daily 2 weeks on 1 week off. Patient understands that the goal of treatment is palliation.  She will come back in 3 weeks for toxicity evaluation.     No orders of the defined types were placed in this encounter.  The patient has a good understanding of the overall plan. she agrees with it. she will call with any problems that may develop before the next visit here.  Nicholas Lose, MD 12/07/2018  Julious Oka Dorshimer am acting as scribe for Dr. Nicholas Lose.  I have reviewed the above documentation for accuracy and completeness, and I agree with the above.

## 2018-12-07 ENCOUNTER — Telehealth: Payer: Self-pay | Admitting: Pharmacist

## 2018-12-07 ENCOUNTER — Inpatient Hospital Stay: Payer: Medicare Other | Attending: Hematology and Oncology | Admitting: Hematology and Oncology

## 2018-12-07 ENCOUNTER — Inpatient Hospital Stay: Payer: Medicare Other

## 2018-12-07 ENCOUNTER — Other Ambulatory Visit: Payer: Self-pay

## 2018-12-07 VITALS — BP 122/62 | HR 89 | Temp 98.2°F | Resp 18

## 2018-12-07 VITALS — BP 120/53 | HR 91 | Temp 98.3°F | Resp 18 | Ht 63.5 in | Wt 180.1 lb

## 2018-12-07 DIAGNOSIS — C50919 Malignant neoplasm of unspecified site of unspecified female breast: Secondary | ICD-10-CM | POA: Diagnosis present

## 2018-12-07 DIAGNOSIS — Z5111 Encounter for antineoplastic chemotherapy: Secondary | ICD-10-CM | POA: Diagnosis present

## 2018-12-07 DIAGNOSIS — C782 Secondary malignant neoplasm of pleura: Secondary | ICD-10-CM | POA: Diagnosis not present

## 2018-12-07 DIAGNOSIS — C7951 Secondary malignant neoplasm of bone: Secondary | ICD-10-CM

## 2018-12-07 DIAGNOSIS — C787 Secondary malignant neoplasm of liver and intrahepatic bile duct: Secondary | ICD-10-CM | POA: Diagnosis present

## 2018-12-07 DIAGNOSIS — C50911 Malignant neoplasm of unspecified site of right female breast: Secondary | ICD-10-CM

## 2018-12-07 MED ORDER — CAPECITABINE 500 MG PO TABS: 1500.0000 mg | ORAL_TABLET | Freq: Two times a day (BID) | ORAL | 3 refills | Status: DC

## 2018-12-07 MED ORDER — FULVESTRANT 250 MG/5ML IM SOLN
INTRAMUSCULAR | Status: AC
  Filled 2018-12-07: qty 10

## 2018-12-07 MED ORDER — FULVESTRANT 250 MG/5ML IM SOLN
500.0000 mg | Freq: Once | INTRAMUSCULAR | Status: AC
  Administered 2018-12-07: 500 mg via INTRAMUSCULAR

## 2018-12-07 NOTE — Patient Instructions (Signed)

## 2018-12-07 NOTE — Telephone Encounter (Signed)
Oral Oncology Pharmacist Encounter  Received new prescription for Xeloda (capecitabine) for the treatment of previously treated, metastatic, hormone receptor positive breast cancer, planned duration until disease progression or unacceptable toxicity.  Xeloda is planned to be administered at ~785 mg/m2 (1500 mg) by mouth 2 times daily for 14 days on, 7 days off, repeated every 21 days  Labs from 11/09/18 assessed, OK for treatment initiation. Her SCr that day was doubled from baseline to 1.96, with est CrCl ~ 30 mL/min Manufacturer recommends dose reduction to 75% of usual dose with CrCl 30-50 mL/min Patient will be instructed to maintain good hydration, labs will be repeated at next office visit  Current medication list in Epic reviewed, no significant DDIs with Xeloda identified.  Prescription has been e-scribed to the [facility] for benefits analysis and approval.  Oral Oncology Clinic will continue to follow for insurance authorization, copayment issues, initial counseling and start date.  [name], PharmD, BCPS, BCOP  12/07/2018 1:59 PM Oral Oncology Clinic [phone]

## 2018-12-10 ENCOUNTER — Telehealth: Payer: Self-pay | Admitting: Hematology and Oncology

## 2018-12-10 MED FILL — CAPECITABINE 500 MG TABS: 500 | 21 days supply | Qty: 84 | Fill #0

## 2018-12-10 NOTE — Telephone Encounter (Signed)
I talk with patient regarding schedule  

## 2018-12-10 NOTE — Telephone Encounter (Signed)
Oral Chemotherapy Pharmacist Encounter   I spoke with patient for overview of: Xeloda (capecitabine) for the treatment of previously treated, metastatic, hormone receptor positive breast cancer, planned duration until disease progression or unacceptable toxicity.  Counseled patient on administration, dosing, side effects, monitoring, drug-food interactions, safe handling, storage, and disposal.  Patient will take Xeloda 500mg  tablets, 3 tablets (1500mg ) by mouth in AM and 3 tabs (1500mg ) by mouth in PM, within 30 minutes of finishing meals, on days 1-14 of each 21 day cycle.   Xeloda start date: 12/17/2018  Adverse effects include but are not limited to: fatigue, decreased blood counts, GI upset, diarrhea, mouth sores, and hand-foot syndrome.  Patient has anti-emetic on hand and knows to take it if nausea develops.   Patient will obtain anti diarrheal and alert the office of 4 or more loose stools above baseline.  Reviewed with patient importance of keeping a medication schedule and plan for any missed doses.  Medication reconciliation performed and medication/allergy list updated. Sent MD message about continuation of fulvestrant, last dose 12/07/18.  Insurance authorization for Xeloda was not required at this time. It will be filled on patient's Medicare Part B medical benefits. Test claim at the pharmacy revealed copayment $60.61 for 1st fill of Xeloda. Patient already has foundation copayment grants secured for her and they will cover the out of pocket costs for Xeloda, out of pocket expense for Xeloda is $0  This will ship from the [facility] on 12/10/2018 to deliver to patient's home on 7/28.  Patient informed the pharmacy will reach out 5-7 days prior to needing next fill of Xeloda to coordinate continued medication acquisition to prevent break in therapy.  All questions answered.  Ms. Solecki voiced understanding and appreciation.   Patient knows to call the  office with questions or concerns.  [name], PharmD, BCPS, BCOP  12/10/2018   11:04 AM Oral Oncology Clinic [phone]

## 2018-12-11 ENCOUNTER — Other Ambulatory Visit: Payer: Self-pay | Admitting: Pharmacist

## 2018-12-11 NOTE — Progress Notes (Signed)
Oral Oncology Pharmacist Encounter  Patient to start on Xeloda on 12/17/18. Fulvestrant to d/c per discussion with MD. Fulvestrant order and scheduling communication orders have been discontinued.  [name], PharmD, BCPS, BCOP  12/11/2018 9:47 AM Oral Oncology Clinic [phone]

## 2018-12-11 NOTE — Telephone Encounter (Signed)
Oral Oncology Patient Advocate Encounter  Confirmed with [facility] that Xeloda was shipped on 7/27 with a $0 copay using the [facility] the patient already had on file.   [place] Patient [place] Phone [phone] Fax [phone] 12/11/2018   3:09 PM

## 2018-12-27 NOTE — Assessment & Plan Note (Deleted)
Metastatic breast cancer: recently progressive in liver and bone, also pleura.  Faslodex + Ibrance beginning 03/31/2016-March 2020 stopped due to progression Exemestane with everolimus discontinued 08/29/2018 due to hyperglycemia  [facility] molecular testing: Androgen receptor positive, PDL 1 neg, MSI stable, NTRK1/2/3Neg, T MBB intermediate 9 mutations/Mb, ESR 1-, PI 3 CA negative, BRCA negative  Current treatment:09/26/2018: Resumedexemestane with everolimus along with Faslodex and Xgeva Patient's blood sugars are all over the place ranging from 150-350.  Due to severe fatigue and feeling poorly we discontinued everolimus 11/09/2018  CT CAP and bone scan 12/03/2018: Stable liver metastases, progressing bone metastases and groundglass opacity in the right lung which is concerning for lymphangitic spread of the cancer.  Treatment plan: Xeloda 1500 mg p.o. twice daily 2 weeks on 1 week off Xeloda toxicities:  Return to clinic in 3 weeks with labs and follow-up

## 2018-12-28 ENCOUNTER — Encounter (HOSPITAL_COMMUNITY): Payer: Self-pay

## 2018-12-28 ENCOUNTER — Inpatient Hospital Stay (HOSPITAL_COMMUNITY)
Admission: EM | Admit: 2018-12-28 | Discharge: 2019-01-03 | DRG: 377 | Disposition: A | Discharge status: Home Health | Payer: Medicare Other | Attending: Internal Medicine | Admitting: Internal Medicine

## 2018-12-28 ENCOUNTER — Emergency Department (HOSPITAL_COMMUNITY): Payer: Medicare Other

## 2018-12-28 ENCOUNTER — Other Ambulatory Visit: Payer: Self-pay

## 2018-12-28 DIAGNOSIS — Z823 Family history of stroke: Secondary | ICD-10-CM

## 2018-12-28 DIAGNOSIS — Z90710 Acquired absence of both cervix and uterus: Secondary | ICD-10-CM

## 2018-12-28 DIAGNOSIS — Z20828 Contact with and (suspected) exposure to other viral communicable diseases: Secondary | ICD-10-CM | POA: Diagnosis present

## 2018-12-28 DIAGNOSIS — E1110 Type 2 diabetes mellitus with ketoacidosis without coma: Secondary | ICD-10-CM | POA: Diagnosis present

## 2018-12-28 DIAGNOSIS — I4892 Unspecified atrial flutter: Secondary | ICD-10-CM | POA: Diagnosis present

## 2018-12-28 DIAGNOSIS — I959 Hypotension, unspecified: Secondary | ICD-10-CM | POA: Diagnosis present

## 2018-12-28 DIAGNOSIS — R112 Nausea with vomiting, unspecified: Secondary | ICD-10-CM | POA: Diagnosis present

## 2018-12-28 DIAGNOSIS — C7800 Secondary malignant neoplasm of unspecified lung: Secondary | ICD-10-CM | POA: Diagnosis present

## 2018-12-28 DIAGNOSIS — Z85820 Personal history of malignant melanoma of skin: Secondary | ICD-10-CM

## 2018-12-28 DIAGNOSIS — C787 Secondary malignant neoplasm of liver and intrahepatic bile duct: Secondary | ICD-10-CM | POA: Diagnosis present

## 2018-12-28 DIAGNOSIS — E872 Acidosis, unspecified: Secondary | ICD-10-CM | POA: Diagnosis present

## 2018-12-28 DIAGNOSIS — K319 Disease of stomach and duodenum, unspecified: Secondary | ICD-10-CM | POA: Diagnosis present

## 2018-12-28 DIAGNOSIS — Z808 Family history of malignant neoplasm of other organs or systems: Secondary | ICD-10-CM

## 2018-12-28 DIAGNOSIS — C50912 Malignant neoplasm of unspecified site of left female breast: Secondary | ICD-10-CM | POA: Diagnosis not present

## 2018-12-28 DIAGNOSIS — I5043 Acute on chronic combined systolic (congestive) and diastolic (congestive) heart failure: Secondary | ICD-10-CM | POA: Diagnosis not present

## 2018-12-28 DIAGNOSIS — D61818 Other pancytopenia: Secondary | ICD-10-CM | POA: Diagnosis present

## 2018-12-28 DIAGNOSIS — C541 Malignant neoplasm of endometrium: Secondary | ICD-10-CM | POA: Diagnosis present

## 2018-12-28 DIAGNOSIS — C55 Malignant neoplasm of uterus, part unspecified: Secondary | ICD-10-CM | POA: Diagnosis not present

## 2018-12-28 DIAGNOSIS — E669 Obesity, unspecified: Secondary | ICD-10-CM | POA: Diagnosis present

## 2018-12-28 DIAGNOSIS — I5033 Acute on chronic diastolic (congestive) heart failure: Secondary | ICD-10-CM | POA: Diagnosis present

## 2018-12-28 DIAGNOSIS — R0603 Acute respiratory distress: Secondary | ICD-10-CM

## 2018-12-28 DIAGNOSIS — Z882 Allergy status to sulfonamides status: Secondary | ICD-10-CM

## 2018-12-28 DIAGNOSIS — Z8542 Personal history of malignant neoplasm of other parts of uterus: Secondary | ICD-10-CM

## 2018-12-28 DIAGNOSIS — D62 Acute posthemorrhagic anemia: Secondary | ICD-10-CM | POA: Diagnosis not present

## 2018-12-28 DIAGNOSIS — R000 Tachycardia, unspecified: Secondary | ICD-10-CM | POA: Diagnosis present

## 2018-12-28 DIAGNOSIS — Z8543 Personal history of malignant neoplasm of ovary: Secondary | ICD-10-CM

## 2018-12-28 DIAGNOSIS — C569 Malignant neoplasm of unspecified ovary: Secondary | ICD-10-CM

## 2018-12-28 DIAGNOSIS — Y95 Nosocomial condition: Secondary | ICD-10-CM | POA: Diagnosis present

## 2018-12-28 DIAGNOSIS — Q2733 Arteriovenous malformation of digestive system vessel: Secondary | ICD-10-CM

## 2018-12-28 DIAGNOSIS — Z7984 Long term (current) use of oral hypoglycemic drugs: Secondary | ICD-10-CM

## 2018-12-28 DIAGNOSIS — Z7901 Long term (current) use of anticoagulants: Secondary | ICD-10-CM

## 2018-12-28 DIAGNOSIS — K222 Esophageal obstruction: Secondary | ICD-10-CM | POA: Diagnosis present

## 2018-12-28 DIAGNOSIS — E119 Type 2 diabetes mellitus without complications: Secondary | ICD-10-CM | POA: Diagnosis not present

## 2018-12-28 DIAGNOSIS — C4359 Malignant melanoma of other part of trunk: Secondary | ICD-10-CM

## 2018-12-28 DIAGNOSIS — J189 Pneumonia, unspecified organism: Secondary | ICD-10-CM | POA: Diagnosis present

## 2018-12-28 DIAGNOSIS — T39395A Adverse effect of other nonsteroidal anti-inflammatory drugs [NSAID], initial encounter: Secondary | ICD-10-CM | POA: Diagnosis present

## 2018-12-28 DIAGNOSIS — I4891 Unspecified atrial fibrillation: Secondary | ICD-10-CM | POA: Diagnosis not present

## 2018-12-28 DIAGNOSIS — Z803 Family history of malignant neoplasm of breast: Secondary | ICD-10-CM

## 2018-12-28 DIAGNOSIS — C50911 Malignant neoplasm of unspecified site of right female breast: Secondary | ICD-10-CM | POA: Diagnosis present

## 2018-12-28 DIAGNOSIS — E785 Hyperlipidemia, unspecified: Secondary | ICD-10-CM | POA: Diagnosis present

## 2018-12-28 DIAGNOSIS — R9389 Abnormal findings on diagnostic imaging of other specified body structures: Secondary | ICD-10-CM | POA: Diagnosis not present

## 2018-12-28 DIAGNOSIS — Z888 Allergy status to other drugs, medicaments and biological substances status: Secondary | ICD-10-CM

## 2018-12-28 DIAGNOSIS — Z85038 Personal history of other malignant neoplasm of large intestine: Secondary | ICD-10-CM

## 2018-12-28 DIAGNOSIS — D649 Anemia, unspecified: Secondary | ICD-10-CM | POA: Diagnosis present

## 2018-12-28 DIAGNOSIS — G8929 Other chronic pain: Secondary | ICD-10-CM | POA: Diagnosis present

## 2018-12-28 DIAGNOSIS — K449 Diaphragmatic hernia without obstruction or gangrene: Secondary | ICD-10-CM | POA: Diagnosis present

## 2018-12-28 DIAGNOSIS — I110 Hypertensive heart disease with heart failure: Secondary | ICD-10-CM | POA: Diagnosis present

## 2018-12-28 DIAGNOSIS — Z6830 Body mass index (BMI) 30.0-30.9, adult: Secondary | ICD-10-CM

## 2018-12-28 DIAGNOSIS — K921 Melena: Principal | ICD-10-CM | POA: Diagnosis present

## 2018-12-28 DIAGNOSIS — J9621 Acute and chronic respiratory failure with hypoxia: Secondary | ICD-10-CM | POA: Diagnosis present

## 2018-12-28 DIAGNOSIS — Z853 Personal history of malignant neoplasm of breast: Secondary | ICD-10-CM

## 2018-12-28 DIAGNOSIS — R262 Difficulty in walking, not elsewhere classified: Secondary | ICD-10-CM | POA: Diagnosis present

## 2018-12-28 DIAGNOSIS — Z800 Family history of malignant neoplasm of digestive organs: Secondary | ICD-10-CM

## 2018-12-28 DIAGNOSIS — Z923 Personal history of irradiation: Secondary | ICD-10-CM

## 2018-12-28 DIAGNOSIS — R0602 Shortness of breath: Secondary | ICD-10-CM | POA: Diagnosis not present

## 2018-12-28 DIAGNOSIS — Z9013 Acquired absence of bilateral breasts and nipples: Secondary | ICD-10-CM

## 2018-12-28 DIAGNOSIS — C7951 Secondary malignant neoplasm of bone: Secondary | ICD-10-CM | POA: Diagnosis present

## 2018-12-28 DIAGNOSIS — Z9981 Dependence on supplemental oxygen: Secondary | ICD-10-CM

## 2018-12-28 DIAGNOSIS — Z79899 Other long term (current) drug therapy: Secondary | ICD-10-CM

## 2018-12-28 DIAGNOSIS — I480 Paroxysmal atrial fibrillation: Secondary | ICD-10-CM | POA: Diagnosis not present

## 2018-12-28 DIAGNOSIS — C50919 Malignant neoplasm of unspecified site of unspecified female breast: Secondary | ICD-10-CM

## 2018-12-28 DIAGNOSIS — Z791 Long term (current) use of non-steroidal anti-inflammatories (NSAID): Secondary | ICD-10-CM

## 2018-12-28 DIAGNOSIS — Z794 Long term (current) use of insulin: Secondary | ICD-10-CM | POA: Diagnosis not present

## 2018-12-28 LAB — CBC WITH DIFFERENTIAL/PLATELET
Band Neutrophils: 0 %
Basophils Absolute: 0 10*3/uL (ref 0.0–0.1)
Basophils Relative: 0 %
Blasts: 0 %
Eosinophils Absolute: 0.1 10*3/uL (ref 0.0–0.5)
Eosinophils Relative: 1 %
HCT: 9.1 % — ABNORMAL LOW (ref 36.0–46.0)
Hemoglobin: 2.6 g/dL — CL (ref 12.0–15.0)
Lymphocytes Relative: 15 %
Lymphs Abs: 1.1 10*3/uL (ref 0.7–4.0)
MCH: 29.9 pg (ref 26.0–34.0)
MCHC: 28.6 g/dL — ABNORMAL LOW (ref 30.0–36.0)
MCV: 104.6 fL — ABNORMAL HIGH (ref 80.0–100.0)
Metamyelocytes Relative: 0 %
Monocytes Absolute: 0.1 10*3/uL (ref 0.1–1.0)
Monocytes Relative: 1 %
Myelocytes: 0 %
Neutro Abs: 6.3 10*3/uL (ref 1.7–7.7)
Neutrophils Relative %: 83 %
Other: 0 %
Platelets: 185 10*3/uL (ref 150–400)
Promyelocytes Relative: 0 %
RBC: <1 MIL/uL — ABNORMAL LOW (ref 3.87–5.11)
RDW: 30.5 % — ABNORMAL HIGH (ref 11.5–15.5)
WBC: 7.6 10*3/uL (ref 4.0–10.5)
nRBC: 9 /100 WBC — ABNORMAL HIGH

## 2018-12-28 LAB — URINALYSIS, ROUTINE W REFLEX MICROSCOPIC
Bilirubin Urine: NEGATIVE
Glucose, UA: 50 mg/dL — AB
Hgb urine dipstick: NEGATIVE
Ketones, ur: 5 mg/dL — AB
Leukocytes,Ua: NEGATIVE
Nitrite: NEGATIVE
Protein, ur: NEGATIVE mg/dL
Specific Gravity, Urine: 1.015 (ref 1.005–1.030)
pH: 5 (ref 5.0–8.0)

## 2018-12-28 LAB — COMPREHENSIVE METABOLIC PANEL
ALT: 11 U/L (ref 0–44)
AST: 35 U/L (ref 15–41)
Albumin: 2.2 g/dL — ABNORMAL LOW (ref 3.5–5.0)
Alkaline Phosphatase: 52 U/L (ref 38–126)
Anion gap: 16 — ABNORMAL HIGH (ref 5–15)
BUN: 31 mg/dL — ABNORMAL HIGH (ref 8–23)
CO2: 15 mmol/L — ABNORMAL LOW (ref 22–32)
Calcium: 6.5 mg/dL — ABNORMAL LOW (ref 8.9–10.3)
Chloride: 110 mmol/L (ref 98–111)
Creatinine, Ser: 1.07 mg/dL — ABNORMAL HIGH (ref 0.44–1.00)
GFR calc Af Amer: 59 mL/min — ABNORMAL LOW (ref >60)
GFR calc non Af Amer: 51 mL/min — ABNORMAL LOW (ref >60)
Glucose, Bld: 259 mg/dL — ABNORMAL HIGH (ref 70–99)
Potassium: 4.2 mmol/L (ref 3.5–5.1)
Sodium: 141 mmol/L (ref 135–145)
Total Bilirubin: 1 mg/dL (ref 0.3–1.2)
Total Protein: 4 g/dL — ABNORMAL LOW (ref 6.5–8.1)

## 2018-12-28 LAB — PREPARE RBC (CROSSMATCH)

## 2018-12-28 LAB — PROTIME-INR
INR: 3.1 — ABNORMAL HIGH (ref 0.8–1.2)
Prothrombin Time: 31.3 seconds — ABNORMAL HIGH (ref 11.4–15.2)

## 2018-12-28 LAB — APTT: aPTT: 31 seconds (ref 24–36)

## 2018-12-28 LAB — SARS CORONAVIRUS 2 BY RT PCR (HOSPITAL ORDER, PERFORMED IN ~~LOC~~ HOSPITAL LAB): SARS Coronavirus 2: NEGATIVE

## 2018-12-28 LAB — LACTIC ACID, PLASMA: Lactic Acid, Venous: 7 mmol/L (ref 0.5–1.9)

## 2018-12-28 MED ORDER — VANCOMYCIN HCL 10 G IV SOLR
1500.0000 mg | Freq: Once | INTRAVENOUS | Status: AC
  Administered 2018-12-28: 1500 mg via INTRAVENOUS
  Filled 2018-12-28: qty 1500

## 2018-12-28 MED ORDER — SODIUM CHLORIDE 0.9 % IV BOLUS (SEPSIS)
500.0000 mL | Freq: Once | INTRAVENOUS | Status: AC
  Administered 2018-12-28: 500 mL via INTRAVENOUS

## 2018-12-28 MED ORDER — SODIUM CHLORIDE 0.9 % IV BOLUS (SEPSIS)
1000.0000 mL | Freq: Once | INTRAVENOUS | Status: AC
  Administered 2018-12-28: 1000 mL via INTRAVENOUS

## 2018-12-28 MED ORDER — SODIUM CHLORIDE 0.9 % IV SOLN
2.0000 g | Freq: Two times a day (BID) | INTRAVENOUS | Status: DC
  Administered 2018-12-29: 2 g via INTRAVENOUS
  Filled 2018-12-28: qty 2

## 2018-12-28 MED ORDER — VANCOMYCIN HCL IN DEXTROSE 1-5 GM/200ML-% IV SOLN: 1000.0000 mg | Freq: Once | INTRAVENOUS | Status: DC

## 2018-12-28 MED ORDER — ALBUTEROL SULFATE HFA 108 (90 BASE) MCG/ACT IN AERS
2.0000 | INHALATION_SPRAY | Freq: Once | RESPIRATORY_TRACT | Status: AC
  Administered 2018-12-28: 2 via RESPIRATORY_TRACT
  Filled 2018-12-28: qty 6.7

## 2018-12-28 MED ORDER — SODIUM CHLORIDE 0.9 % IV SOLN
2.0000 g | Freq: Once | INTRAVENOUS | Status: AC
  Administered 2018-12-28: 2 g via INTRAVENOUS
  Filled 2018-12-28: qty 2

## 2018-12-28 MED ORDER — VANCOMYCIN HCL IN DEXTROSE 750-5 MG/150ML-% IV SOLN: 750.0000 mg | INTRAVENOUS | Status: DC

## 2018-12-28 MED ORDER — METRONIDAZOLE IN NACL 5-0.79 MG/ML-% IV SOLN
500.0000 mg | Freq: Once | INTRAVENOUS | Status: AC
  Administered 2018-12-28: 500 mg via INTRAVENOUS
  Filled 2018-12-28: qty 100

## 2018-12-28 MED ORDER — SODIUM CHLORIDE 0.9 % IV SOLN
1000.0000 mL | INTRAVENOUS | Status: DC
  Administered 2018-12-28: 1000 mL via INTRAVENOUS

## 2018-12-28 MED ORDER — SODIUM CHLORIDE 0.9 % IV SOLN: 10.0000 mL/h | Freq: Once | INTRAVENOUS | Status: DC

## 2018-12-28 NOTE — ED Triage Notes (Signed)
Pt arrives via [facility] from home with AMS. Per EMS pt has been having N/V AND intermittent SOB that got better for a few days but then returned today with a general decline of mental status. Pt walks at baseline but has been been unable to do so. EMS VS 102/78, HR 104, CBG 329, TEMP 97.1 F, 97% on 4L (wears 4L at baseline).

## 2018-12-28 NOTE — Progress Notes (Signed)
Pharmacy Antibiotic Note  Brittany Porter is a 73 y.o. female admitted on 12/28/2018 with sepsis.  Pharmacy has been consulted for vancomycin/cefepime dosing.  LA elevated 7.0. Afebrile, Tmax 99.2. Scr 1.07.   Plan: Vancomycin 1500 mg IV loading dose Vancomycin 750 mg IV Q 24 hrs. Goal AUC 400-550. Expected AUC: 497.4 SCr used: 1.07 Cefepime 2 gm IV q12h Monitor cxs, renal fxn, clinical improvement, and abx de-escalation as needed  Height: 5\' 4"  (162.6 cm) Weight: 180 lb (81.6 kg) IBW/kg (Calculated) : 54.7  Temp (24hrs), Avg:99.2 F (37.3 C), Min:99.2 F (37.3 C), Max:99.2 F (37.3 C)  Recent Labs  Lab 12/28/18 1826  CREATININE 1.07*  LATICACIDVEN 7.0*    Estimated Creatinine Clearance: 47 mL/min (A) (by C-G formula based on SCr of 1.07 mg/dL (H)).    Allergies  Allergen Reactions  . Prednisolone Shortness Of Breath and Other (See Comments)     increased BP  . Prednisone Anaphylaxis  . Sulfa Antibiotics Swelling    "Eyes swelled shut" - reaction to eye drops    Antimicrobials this admission: 8/14 Vanc >> 8/14 Cefepime >> 8/14 Flagyl >>  Microbiology results: 8/14 BCx sent 8/14 UCx sent  Thank you for allowing pharmacy to be a part of this patient's care.  Mirian Capuchin, 12/28/2018 7:47 PM

## 2018-12-28 NOTE — H&P (Signed)
History and Physical   Brittany Porter KGY:[id number] DOB: 20-Feb-1946 DOA: 12/28/2018  Referring MD/NP/PA: Dr. Stark Jock  PCP: [name], MD   Outpatient Specialists: Dr. Lindi Adie  Patient coming from: Home  Chief Complaint: Generalized weakness and altered mental status  HPI: Brittany Porter is a 73 y.o. female with medical history significant of bilateral breast cancer with metastasis to the bone and the liver, ovarian and endometrial cancer, history of colon cancer, diabetes, hypertension, atrial fibrillation who was brought in by EMS secondary to altered mental status.  She has had some nausea vomiting and intermittent shortness of breath for the last few days.  She has declined and daughter noted confusion.  Patient has been weak in general and not eating much.  She was having shortness of breath with little activity and also palpitations.  Patient seen in the ER and was found to have hemoglobin of 2.7.  She denied any melena or bright red blood per rectum.  Denied any hematemesis.  She is not on any active chemo except for oral Xeloda.  Patient is being admitted with symptomatic anemia.  Denied any chest pain.  No fever but appears to have lactic acidosis..  ED Course: Temperature is 99.2 blood pressure 84/47 with pulse 97 respirate 30 oxygen sats 100% on 4 L.  White count 7.6 hemoglobin 2.6 and platelets 185.  Sodium 141 potassium 4.2 chloride 110s.  CO2 15 BUN 31 creatinine 1.07.  Glucose 259 albumin 2.2.  Lactic acid 7.0.  Chest x-ray showed small to moderate sized left pleural effusion which is being increased.  There is increased left basilar atelectasis or pneumonia with minimal right basilar atelectasis.  Patient is being admitted with symptomatic anemia possible pneumonia and pleural effusion.  Review of Systems: As per HPI otherwise 10 point review of systems negative.    Past Medical History:  Diagnosis Date  . Arthritis   . Breast cancer ([name])   . Diabetes mellitus   . Endometrial  carcinoma ([place]) 10/2008  . Family history of breast cancer   . Family history of colon cancer   . Family history of skin cancer   . Fluid overload   . History of radiation therapy 04/13/2009, 04/16/2009, 04/27/2009, 05/07/2009, 05/18/2009   3000 cGy to proximal vagina  . Hyperlipidemia   . Iron deficiency   . Melanoma ([place]) 2010   rt arm and back  . Ovarian cancer ([place])   . Pleural effusion 04/2010    Past Surgical History:  Procedure Laterality Date  . ABDOMINAL HYSTERECTOMY  10/2008  . APPENDECTOMY    . INSERTION OF MESH  05/28/2012   Procedure: INSERTION OF MESH;  Surgeon: Adin Hector, MD;  Location: [facility];  Service: General;  Laterality: N/A;  . KNEE ARTHROSCOPY  04/2010   Right knee  . LAPAROSCOPIC LYSIS OF ADHESIONS  05/28/2012   Procedure: LAPAROSCOPIC LYSIS OF ADHESIONS;  Surgeon: Adin Hector, MD;  Location: [facility];  Service: General;  Laterality: N/A;  . MASTECTOMY  1997   Bilateral with lymph nodes  . Melanoma removal  02/2009, 07/2009  . VENTRAL HERNIA REPAIR  05/28/2012   Procedure: LAPAROSCOPIC VENTRAL HERNIA;  Surgeon: Adin Hector, MD;  Location: [facility];  Service: General;  Laterality: N/A;     reports that she has never smoked. She has never used smokeless tobacco. She reports that she does not drink alcohol or use drugs.  Allergies  Allergen Reactions  . Prednisolone Shortness Of Breath and Other (See  Comments)     increased BP  . Prednisone Anaphylaxis  . Sulfa Antibiotics Swelling    "Eyes swelled shut" - reaction to eye drops    Family History  Problem Relation Age of Onset  . Stroke Mother   . Cancer Father        'voice box'  . Colon cancer Sister        dx >50  . Heart attack Brother   . Skin cancer Brother 42  . Cancer Sister        primary site unk, dx >50  . Diabetes Brother   . Stroke Brother   . Stroke Sister   . ALS Sister   . Colon cancer Sister 38  . Dementia Sister   . Breast cancer Sister        dx >5-, met to brain      Prior to Admission medications   Medication Sig Start Date End Date Taking? Authorizing Provider  amiodarone (PACERONE) 200 MG tablet START BY TAKING 1 TABLET BY MOUTH TWICE DAILY, THEN REDUCE TO 1 TABLET BY MOUTH ONCE DAILY 11/28/18   Elouise Munroe, MD  Calcium Carb-Cholecalciferol (CALCIUM 600+D3) 600-800 MG-UNIT TABS Take 1 tablet by mouth 2 (two) times daily.     [provider]  capecitabine (XELODA) 500 MG tablet Take 3 tablets (1,500 mg total) by mouth 2 (two) times daily after a meal. Take for 14 days on, 7 days off, repeat every 21 days 12/07/18   Nicholas Lose, MD  Cholecalciferol (VITAMIN D3) 2000 UNITS TABS Take 2,000 mg by mouth daily.    [provider]  ferrous sulfate 325 (65 FE) MG tablet Take 325 mg by mouth daily as needed (energy).     [provider]  fish oil-omega-3 fatty acids 1000 MG capsule Take 1 g by mouth 2 (two) times daily.     [provider]  furosemide (LASIX) 20 MG tablet Take 20 mg by mouth daily as needed for fluid or edema. Reported on 05/07/2015 10/08/14   [provider]  gabapentin (NEURONTIN) 100 MG capsule Take 100 mg by mouth 2 (two) times daily. 07/27/18   [provider]  ibuprofen (ADVIL,MOTRIN) 200 MG tablet Take 400 mg by mouth every 6 (six) hours as needed for headache (pain). Reported on 11/12/2015    [provider]  Ipratropium-Albuterol (COMBIVENT RESPIMAT) 20-100 MCG/ACT AERS respimat Inhale 1 puff into the lungs every 6 (six) hours. 11/27/18   Margaretha Seeds, MD  lidocaine-prilocaine (EMLA) cream Apply to affected area once 09/24/18   Nicholas Lose, MD  Magnesium Oxide (MAG-OXIDE) 200 MG TABS Take 1 tablet (200 mg total) by mouth daily. Patient taking differently: Take 200 mg by mouth at bedtime.  06/06/18   Isla Pence, MD  metFORMIN (GLUCOPHAGE) 500 MG tablet Take 2 tablets (1,000 mg total) by mouth 2 (two) times daily with a meal for 30 days. 08/24/18 11/27/18  Long,  Wonda Olds, MD  metoprolol succinate (TOPROL-XL) 25 MG 24 hr tablet Take 1 tablet (25 mg total) by mouth daily. 10/15/18   Elouise Munroe, MD  naproxen sodium (ALEVE) 220 MG tablet Take 220 mg by mouth daily as needed (pain).    [provider]  ondansetron (ZOFRAN) 8 MG tablet Take 1 tablet (8 mg total) by mouth 2 (two) times daily as needed (Nausea or vomiting). 09/24/18   Nicholas Lose, MD  OXYGEN Inhale into the lungs.    [provider]  prochlorperazine (COMPAZINE)  10 MG tablet Take 1 tablet (10 mg total) by mouth every 6 (six) hours as needed (Nausea or vomiting). 09/24/18   Nicholas Lose, MD  rivaroxaban (XARELTO) 20 MG TABS tablet Take 1 tablet (20 mg total) by mouth daily with supper. 10/15/18   Elouise Munroe, MD  simvastatin (ZOCOR) 40 MG tablet Take 40 mg by mouth at bedtime.      [provider]  sodium chloride (OCEAN) 0.65 % SOLN nasal spray Place 1 spray into both nostrils as needed for congestion.    [provider]    Physical Exam: Vitals:   12/28/18 2236 12/28/18 2245 12/28/18 2300 12/28/18 2302  BP: 123/63 (!) 141/87 112/66 112/66  Pulse: 97 96  93  Resp: 19 (!) 25 (!) 32 (!) 28  Temp:    97.7 F (36.5 C)  TempSrc:      SpO2: 99% 100%    Weight:      Height:          Constitutional: Chronically ill looking, very pale Vitals:   12/28/18 2236 12/28/18 2245 12/28/18 2300 12/28/18 2302  BP: 123/63 (!) 141/87 112/66 112/66  Pulse: 97 96  93  Resp: 19 (!) 25 (!) 32 (!) 28  Temp:    97.7 F (36.5 C)  TempSrc:      SpO2: 99% 100%    Weight:      Height:       Eyes: PERRL, lids and conjunctivae pale ENMT: Mucous membranes are dry. Posterior pharynx clear of any exudate or lesions.Normal dentition.  Neck: normal, supple, no masses, no thyromegaly Respiratory: Bilateral basal crackles more on the left, increased respiratory effort. No accessory muscle use.  Cardiovascular: Sinus tachycardia, no murmurs / rubs / gallops.  1+  extremity edema 2+ pedal pulses. No carotid bruits.  Abdomen: no tenderness, no masses palpated. No hepatosplenomegaly. Bowel sounds positive.  Musculoskeletal: no clubbing / cyanosis. No joint deformity upper and lower extremities. Good ROM, no contractures. Normal muscle tone.  Skin: no rashes, lesions, ulcers. No induration, very pale skin Neurologic: CN 2-12 grossly intact. Sensation intact, DTR normal. Strength 5/5 in all 4.  Psychiatric: Mild confusion but communicating, normal mood.     Labs on Admission: I have personally reviewed following labs and imaging studies  CBC: Recent Labs  Lab 12/28/18 1935  WBC 7.6  NEUTROABS 6.3  HGB 2.6*  HCT 9.1*  MCV 104.6*  PLT 539   Basic Metabolic Panel: Recent Labs  Lab 12/28/18 1826  NA 141  K 4.2  CL 110  CO2 15*  GLUCOSE 259*  BUN 31*  CREATININE 1.07*  CALCIUM 6.5*   GFR: Estimated Creatinine Clearance: 47 mL/min (A) (by C-G formula based on SCr of 1.07 mg/dL (H)). Liver Function Tests: Recent Labs  Lab 12/28/18 1826  AST 35  ALT 11  ALKPHOS 52  BILITOT 1.0  PROT 4.0*  ALBUMIN 2.2*   No results for input(s): LIPASE, AMYLASE in the last 168 hours. No results for input(s): AMMONIA in the last 168 hours. Coagulation Profile: Recent Labs  Lab 12/28/18 2140  INR 3.1*   Cardiac Enzymes: No results for input(s): CKTOTAL, CKMB, CKMBINDEX, TROPONINI in the last 168 hours. BNP (last 3 results) No results for input(s): PROBNP in the last 8760 hours. HbA1C: No results for input(s): HGBA1C in the last 72 hours. CBG: No results for input(s): GLUCAP in the last 168 hours. Lipid Profile: No results for input(s): CHOL, HDL, LDLCALC, TRIG, CHOLHDL, LDLDIRECT in the  last 72 hours. Thyroid Function Tests: No results for input(s): TSH, T4TOTAL, FREET4, T3FREE, THYROIDAB in the last 72 hours. Anemia Panel: No results for input(s): VITAMINB12, FOLATE, FERRITIN, TIBC, IRON, RETICCTPCT in the last 72 hours. Urine  analysis:    Component Value Date/Time   COLORURINE YELLOW 12/28/2018 2148   APPEARANCEUR CLEAR 12/28/2018 2148   LABSPEC 1.015 12/28/2018 2148   PHURINE 5.0 12/28/2018 2148   GLUCOSEU 50 (A) 12/28/2018 2148   HGBUR NEGATIVE 12/28/2018 2148   [place] NEGATIVE 12/28/2018 2148   KETONESUR 5 (A) 12/28/2018 2148   PROTEINUR NEGATIVE 12/28/2018 2148   UROBILINOGEN 0.2 05/15/2012 0221   NITRITE NEGATIVE 12/28/2018 2148   LEUKOCYTESUR NEGATIVE 12/28/2018 2148   Sepsis Labs: _0 (procalcitonin:4,lacticidven:4) )No results found for this or any previous visit (from the past 240 hour(s)).   Radiological Exams on Admission: Dg Chest Portable 1 View  Result Date: 12/28/2018 CLINICAL DATA:  Nausea, vomiting and intermittent shortness of breath. EXAM: PORTABLE CHEST 1 VIEW COMPARISON:  11/27/2018. FINDINGS: The cardiac silhouette remains mildly enlarged. Small to moderate-sized left pleural effusion, increased. Increased left basilar airspace opacity. Minimal right basilar atelectasis with improvement. Stable diffuse peribronchial thickening and accentuation of the interstitial markings. Bilateral postmastectomy changes with bilateral axillary surgical clips. IMPRESSION: 1. Small to moderate-sized left pleural effusion, increased. 2. Increased left basilar atelectasis or pneumonia. 3. Minimal right basilar atelectasis with improvement. 4. Stable mild cardiomegaly and chronic bronchitic changes. Electronically Signed   By: Claudie Revering M.D.   On: 12/28/2018 21:53    Assessment/Plan Principal Problem:   Symptomatic anemia Active Problems:   Ovarian cancer (HCC)   Uterine carcinoma (HCC)   DM2 (diabetes mellitus, type 2) (HCC)   Bilateral breast cancer (HCC)   Atrial fibrillation and flutter (HCC)   Lactic acidosis   Healthcare-associated pneumonia     #1 symptomatic anemia: Patient will be admitted to progressive care.  Will transfuse at least 4 units of packed red blood cells at  the outset.  Work-up patient for source of bleed.  Recent hemoglobin was 10 on November 09, 2018.  No evidence of acute bleed.  MCV is 104.  Appears to be microcytic in nature probably due to bleed or bone marrow failure.  We will still check iron studies R42 and folic acid.  Oncology to follow and help with diagnosis.  We will hold Xarelto as well as NSAIDs.  #2 bilateral breast cancer: Status post bilateral mastectomies was treatment.  Currently on Xeloda.  Continue  #3 diabetes: Sliding scale insulin.  #4 lactic acidosis: Suspected pneumonia.  Empiric antibiotics.  #5 possible healthcare associated pneumonia: Empirically treat with antibiotics.  #6 atrial fibrillation: Rate is controlled with some mild tachycardia.  I will hold Xarelto.   DVT prophylaxis: SCD Code Status: Full code Family Communication: Daughter at bedside Disposition Plan: To be determined Consults called: Oncology consult in the morning Admission status: Inpatient  Severity of Illness: The appropriate patient status for this patient is INPATIENT. Inpatient status is judged to be reasonable and necessary in order to provide the required intensity of service to ensure the patient's safety. The patient's presenting symptoms, physical exam findings, and initial radiographic and laboratory data in the context of their chronic comorbidities is felt to place them at high risk for further clinical deterioration. Furthermore, it is not anticipated that the patient will be medically stable for discharge from the hospital within 2 midnights of admission. The following factors support the patient status of inpatient.   " The  patient's presenting symptoms include altered mental status and weakness with shortness of breath. " The worrisome physical exam findings include generalized [place] with bilateral crackles. " The initial radiographic and laboratory data are worrisome because of hemoglobin of 2.6. " The chronic co-morbidities  include metastatic breast cancer.   * I certify that at the point of admission it is my clinical judgment that the patient will require inpatient hospital care spanning beyond 2 midnights from the point of admission due to high intensity of service, high risk for further deterioration and high frequency of surveillance required.Barbette Merino MD [facility] Pager [phone]  If 7PM-7AM, please contact night-coverage [email or web address] Password [facility]  12/28/2018, 11:08 PM

## 2018-12-28 NOTE — ED Provider Notes (Signed)
[facility] EMERGENCY DEPARTMENT Provider Note   CSN: [id number] Arrival date & time: 12/28/18  1810    History   Chief Complaint Chief Complaint  Patient presents with  . Altered Mental Status    HPI Brittany Porter is a 73 y.o. female with past medical history significant for metastatic breast cancer to multiple sites including liver and spine presents via EMS with chief complaint of altered mental status.  Daughter is at the bedside and reports patient has had nausea, vomiting and shortness of breath for the last 1 week after starting a new chemo regiment oral Xeloda.  She thought this was a side effect and has been closely monitoring pt through out the week.  However today she reports when talking to her mother she was confused.  She is unable to walk and get out of the chair.  1 week ago she was ambulatory with minimal assistance.  Patient is having increasing shortness of breath at rest and generalized weakness.  Daughter also reports patient was treated for UTI x1 month ago.  She helps change oncologist is Dr. Lindi Adie. She denies fever, chills, cough, abdominal pain, urinary frequency, hematuria, dysuria, diarrhea, melena.  Discussed CODE STATUS with patient's daughter.  She reports she is unable to make that decision without her father.  They have not discussed this previously.  History provided by patient with additional history obtained from chart review.    Past Medical History:  Diagnosis Date  . Arthritis   . Breast cancer ([place])   . Diabetes mellitus   . Endometrial carcinoma ([place]) 10/2008  . Family history of breast cancer   . Family history of colon cancer   . Family history of skin cancer   . Fluid overload   . History of radiation therapy 04/13/2009, 04/16/2009, 04/27/2009, 05/07/2009, 05/18/2009   3000 cGy to proximal vagina  . Hyperlipidemia   . Iron deficiency   . Melanoma ([place]) 2010   rt arm and back  . Ovarian cancer ([place])   . Pleural effusion  04/2010    Patient Active Problem List   Diagnosis Date Noted  . Symptomatic anemia 12/28/2018  . Lactic acidosis 12/28/2018  . Goals of care, counseling/discussion 09/24/2018  . Atrial fibrillation and flutter ([place]) 06/11/2018  . Genetic testing 03/21/2018  . Family history of breast cancer   . Family history of colon cancer   . Family history of skin cancer   . 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 Breast cancer metastasized to multiple sites, unspecified laterality ([place]) 03/27/2016  . History of ovarian cancer 03/27/2016  . History of endometrial cancer 03/27/2016  . History of melanoma 03/27/2016  . Poor venous access 03/27/2016  . Encounter for antineoplastic chemotherapy 10/02/2015  . Breast cancer metastasized to multiple sites ([employer]) 10/02/2015  . Breast cancer metastasized to liver ([place]) 09/27/2015  . Liver lesion, right lobe 09/06/2015  . Capsulitis 07/23/2015  . DDD (degenerative disc disease), lumbar 06/27/2015  . Ovarian CA, unspecified laterality ([place]) 05/08/2015  . Osteoarthritis of ankle and foot 04/01/2015  . Morbid obesity with BMI of 40.0-44.9, adult ([place]) 03/29/2015  . Bilateral malignant neoplasm of breast in female [facility]) 03/29/2015  . Breast cancer metastasized to pleura ([place]) 12/11/2014  . Obesity (BMI 30-39.9) 12/11/2014  . 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 High risk medication use 12/11/2014  . Bilateral breast cancer ([place]) 09/25/2013  . DM2 (diabetes mellitus, type 2) ([place]) 01/21/2012  . Hyperlipidemia 01/21/2012  .  Melanoma of upper back excluding scapular region ([place]) 09/22/2011  . Ovarian cancer ([place]) 08/09/2011  . Uterine carcinoma ([place]) 08/09/2011    Past Surgical History:  Procedure Laterality Date  . ABDOMINAL HYSTERECTOMY  10/2008  . APPENDECTOMY    . INSERTION OF MESH  05/28/2012   Procedure: INSERTION OF MESH;  Surgeon: Adin Hector, MD;  Location: [facility];  Service: General;  Laterality: N/A;  . KNEE  ARTHROSCOPY  04/2010   Right knee  . LAPAROSCOPIC LYSIS OF ADHESIONS  05/28/2012   Procedure: LAPAROSCOPIC LYSIS OF ADHESIONS;  Surgeon: Adin Hector, MD;  Location: [facility];  Service: General;  Laterality: N/A;  . MASTECTOMY  1997   Bilateral with lymph nodes  . Melanoma removal  02/2009, 07/2009  . VENTRAL HERNIA REPAIR  05/28/2012   Procedure: LAPAROSCOPIC VENTRAL HERNIA;  Surgeon: Adin Hector, MD;  Location: [facility];  Service: General;  Laterality: N/A;     OB History   No obstetric history on file.      Home Medications    Prior to Admission medications   Medication Sig Start Date End Date Taking? Authorizing Provider  amiodarone (PACERONE) 200 MG tablet START BY TAKING 1 TABLET BY MOUTH TWICE DAILY, THEN REDUCE TO 1 TABLET BY MOUTH ONCE DAILY 11/28/18   Elouise Munroe, MD  Calcium Carb-Cholecalciferol (CALCIUM 600+D3) 600-800 MG-UNIT TABS Take 1 tablet by mouth 2 (two) times daily.     [provider]  capecitabine (XELODA) 500 MG tablet Take 3 tablets (1,500 mg total) by mouth 2 (two) times daily after a meal. Take for 14 days on, 7 days off, repeat every 21 days 12/07/18   Nicholas Lose, MD  Cholecalciferol (VITAMIN D3) 2000 UNITS TABS Take 2,000 mg by mouth daily.    [provider]  ferrous sulfate 325 (65 FE) MG tablet Take 325 mg by mouth daily as needed (energy).     [provider]  fish oil-omega-3 fatty acids 1000 MG capsule Take 1 g by mouth 2 (two) times daily.     [provider]  furosemide (LASIX) 20 MG tablet Take 20 mg by mouth daily as needed for fluid or edema. Reported on 05/07/2015 10/08/14   [provider]  gabapentin (NEURONTIN) 100 MG capsule Take 100 mg by mouth 2 (two) times daily. 07/27/18   [provider]  ibuprofen (ADVIL,MOTRIN) 200 MG tablet Take 400 mg by mouth every 6 (six) hours as needed for headache (pain). Reported on 11/12/2015    [provider]  Ipratropium-Albuterol  (COMBIVENT RESPIMAT) 20-100 MCG/ACT AERS respimat Inhale 1 puff into the lungs every 6 (six) hours. 11/27/18   Margaretha Seeds, MD  lidocaine-prilocaine (EMLA) cream Apply to affected area once 09/24/18   Nicholas Lose, MD  Magnesium Oxide (MAG-OXIDE) 200 MG TABS Take 1 tablet (200 mg total) by mouth daily. Patient taking differently: Take 200 mg by mouth at bedtime.  06/06/18   Isla Pence, MD  metFORMIN (GLUCOPHAGE) 500 MG tablet Take 2 tablets (1,000 mg total) by mouth 2 (two) times daily with a meal for 30 days. 08/24/18 11/27/18  Long, Wonda Old[employer], MD  metoprolol succinate (TOPROL-XL) 25 MG 24 hr tablet Take 1 tablet (25 mg total) by mouth daily. 10/15/18   Elouise Munroe, MD  naproxen sodium (ALEVE) 220 MG tablet Take 220 mg by mouth daily as needed (pain).    [provider]  ondansetron (ZOFRAN) 8 MG tablet Take 1 tablet (8 mg total)  by mouth 2 (two) times daily as needed (Nausea or vomiting). 09/24/18   Nicholas Lose, MD  OXYGEN Inhale into the lungs.    [provider]  prochlorperazine (COMPAZINE) 10 MG tablet Take 1 tablet (10 mg total) by mouth every 6 (six) hours as needed (Nausea or vomiting). 09/24/18   Nicholas Lose, MD  rivaroxaban (XARELTO) 20 MG TABS tablet Take 1 tablet (20 mg total) by mouth daily with supper. 10/15/18   Elouise Munroe, MD  simvastatin (ZOCOR) 40 MG tablet Take 40 mg by mouth at bedtime.      [provider]  sodium chloride (OCEAN) 0.65 % SOLN nasal spray Place 1 spray into both nostrils as needed for congestion.    [provider]    Family History Family History  Problem Relation Age of Onset  . Stroke Mother   . Cancer Father        'voice box'  . Colon cancer Sister        dx >50  . Heart attack Brother   . Skin cancer Brother 45  . Cancer Sister        primary site unk, dx >50  . Diabetes Brother   . Stroke Brother   . Stroke Sister   . ALS Sister   . Colon cancer Sister 65  . Dementia Sister   . Breast  cancer Sister        dx >5-, met to brain    Social History Social History   Tobacco Use  . Smoking status: Never Smoker  . Smokeless tobacco: Never Used  Substance Use Topics  . Alcohol use: No  . Drug use: No     Allergies   Prednisolone, Prednisone, and Sulfa antibiotics   Review of Systems Review of Systems  Constitutional: Positive for activity change. Negative for chills and fever.  HENT: Negative for congestion, ear discharge, ear pain, sinus pressure, sinus pain and sore throat.   Eyes: Negative for pain and redness.  Respiratory: Positive for shortness of breath. Negative for cough.   Cardiovascular: Negative for chest pain.  Gastrointestinal: Positive for nausea. Negative for abdominal pain, constipation, diarrhea and vomiting.  Genitourinary: Negative for dysuria and hematuria.  Musculoskeletal: Positive for back pain. Negative for neck pain.  Skin: Negative for wound.  Allergic/Immunologic: Positive for immunocompromised state.  Neurological: Positive for weakness. Negative for numbness and headaches.     Physical Exam Updated Vital Signs BP 97/69   Pulse (!) 57   Temp 99.2 F (37.3 C) (Rectal)   Resp 15   Ht _0  (1.626 m)   Wt 81.6 kg   SpO2 91%   BMI 30.90 kg/m   Physical Exam Vitals signs and nursing note reviewed.  Constitutional:      General: She is in acute distress.     Appearance: She is obese. She is ill-appearing.  HENT:     Head: Normocephalic and atraumatic.     Right Ear: Tympanic membrane and external ear normal.     Left Ear: Tympanic membrane and external ear normal.     Nose: Nose normal.     Mouth/Throat:     Mouth: Mucous membranes are dry.     Pharynx: Oropharynx is clear.  Eyes:     General: No scleral icterus.       Right eye: No discharge.        Left eye: No discharge.     Extraocular Movements: Extraocular movements intact.  Conjunctiva/sclera: Conjunctivae normal.     Pupils: Pupils are equal, round, and  reactive to light.  Neck:     Musculoskeletal: Normal range of motion.     Vascular: No JVD.  Cardiovascular:     Rate and Rhythm: Regular rhythm.     Pulses: Normal pulses.          Radial pulses are 2+ on the right side and 2+ on the left side.     Heart sounds: Normal heart sounds.     Comments: tachycardia to 104 during initial interview and physical Pulmonary:     Comments:  Symmetric chest rise.  Rales heard in left lower lung base.  She is tachypneic, in mild respiratory distress.  She is speaking in short sentences.  No accessory muscle use. Abdominal:     Tenderness: There is no right CVA tenderness or left CVA tenderness.     Comments: Abdomen is soft, non-distended, and non-tender in all quadrants. No rigidity, no guarding. No peritoneal signs.  Genitourinary:    Comments: Chaperone Gregor Hams present for exam. Digital Rectal Exam reveals sphincter with good tone. No external hemorrhoids. No masses or fissures. Stool color is brown with no overt blood. No gross melena.  Musculoskeletal: Normal range of motion.     Right lower leg: Edema present.     Left lower leg: Edema present.  Skin:    General: Skin is warm and dry.     Capillary Refill: Capillary refill takes less than 2 seconds.     Coloration: Skin is pale.  Neurological:     Mental Status: She is oriented to person, place, and time.     GCS: GCS eye subscore is 4. GCS verbal subscore is 5. GCS motor subscore is 6.     Comments: Fluent speech, no facial droop.  Psychiatric:        Behavior: Behavior normal.      ED Treatments / Results  Labs (all labs ordered are listed, but only abnormal results are displayed) Labs Reviewed  COMPREHENSIVE METABOLIC PANEL - Abnormal; Notable for the following components:      Result Value   CO2 15 (*)    Glucose, Bld 259 (*)    BUN 31 (*)    Creatinine, Ser 1.07 (*)    Calcium 6.5 (*)    Total Protein 4.0 (*)    Albumin 2.2 (*)    GFR calc non Af Amer 51 (*)    GFR  calc Af Amer 59 (*)    Anion gap 16 (*)    All other components within normal limits  LACTIC ACID, PLASMA - Abnormal; Notable for the following components:   Lactic Acid, Venous 7.0 (*)    All other components within normal limits  URINALYSIS, ROUTINE W REFLEX MICROSCOPIC - Abnormal; Notable for the following components:   Glucose, UA 50 (*)    Ketones, ur 5 (*)    All other components within normal limits  CBC WITH DIFFERENTIAL/PLATELET - Abnormal; Notable for the following components:   RBC <1.00 (*)    Hemoglobin 2.6 (*)    HCT 9.1 (*)    MCV 104.6 (*)    MCHC 28.6 (*)    RDW 30.5 (*)    nRBC 9 (*)    All other components within normal limits  PROTIME-INR - Abnormal; Notable for the following components:   Prothrombin Time 31.3 (*)    INR 3.1 (*)    All other components within normal limits  CULTURE, BLOOD (ROUTINE X 2)  CULTURE, BLOOD (ROUTINE X 2)  URINE CULTURE  SARS CORONAVIRUS 2 (HOSPITAL ORDER, PERFORMED IN [facility])  APTT  LACTIC ACID, PLASMA  CBC WITH DIFFERENTIAL/PLATELET  POC OCCULT BLOOD, ED  TYPE AND SCREEN  PREPARE RBC (CROSSMATCH)    EKG None  Radiology Dg Chest Portable 1 View  Result Date: 12/28/2018 CLINICAL DATA:  Nausea, vomiting and intermittent shortness of breath. EXAM: PORTABLE CHEST 1 VIEW COMPARISON:  11/27/2018. FINDINGS: The cardiac silhouette remains mildly enlarged. Small to moderate-sized left pleural effusion, increased. Increased left basilar airspace opacity. Minimal right basilar atelectasis with improvement. Stable diffuse peribronchial thickening and accentuation of the interstitial markings. Bilateral postmastectomy changes with bilateral axillary surgical clips. IMPRESSION: 1. Small to moderate-sized left pleural effusion, increased. 2. Increased left basilar atelectasis or pneumonia. 3. Minimal right basilar atelectasis with improvement. 4. Stable mild cardiomegaly and chronic bronchitic changes. Electronically Signed    By: Claudie Revering M.D.   On: 12/28/2018 21:53    Procedures .Critical Care Performed by: Cherre Robins, PA-C Authorized by: Cherre Robins, PA-C   Critical care provider statement:    Critical care time (minutes):  45   Critical care time was exclusive of:  Separately billable procedures and treating other patients and teaching time   Critical care was time spent personally by me on the following activities:  Development of treatment plan with patient or surrogate, discussions with consultants, evaluation of patient's response to treatment, examination of patient, obtaining history from patient or surrogate, re-evaluation of patient's condition, pulse oximetry, ordering and review of radiographic studies, ordering and review of laboratory studies, review of old charts and ordering and performing treatments and interventions   (including critical care time)  Medications Ordered in ED Medications  0.9 %  sodium chloride infusion (1,000 mLs Intravenous New Bag/Given 12/28/18 2212)  vancomycin (VANCOCIN) 1,500 mg in sodium chloride 0.9 % 500 mL IVPB (1,500 mg Intravenous New Bag/Given 12/28/18 2216)  ceFEPIme (MAXIPIME) 2 g in sodium chloride 0.9 % 100 mL IVPB (has no administration in time range)  vancomycin (VANCOCIN) IVPB 750 mg/150 ml premix (has no administration in time range)  0.9 %  sodium chloride infusion (has no administration in time range)  ceFEPIme (MAXIPIME) 2 g in sodium chloride 0.9 % 100 mL IVPB (0 g Intravenous Stopped 12/28/18 2007)  metroNIDAZOLE (FLAGYL) IVPB 500 mg (0 mg Intravenous Stopped 12/28/18 2118)  sodium chloride 0.9 % bolus 1,000 mL (0 mLs Intravenous Stopped 12/28/18 1940)    And  sodium chloride 0.9 % bolus 1,000 mL (0 mLs Intravenous Stopped 12/28/18 1940)    And  sodium chloride 0.9 % bolus 500 mL (0 mLs Intravenous Stopped 12/28/18 2252)  albuterol (VENTOLIN HFA) 108 (90 Base) MCG/ACT inhaler 2 puff (2 puffs Inhalation Given 12/28/18 2255)      Initial Impression / Assessment and Plan / ED Course  I have reviewed the triage vital signs and the nursing notes.  Pertinent labs & imaging results that were available during my care of the patient were reviewed by me and considered in my medical decision making (see chart for details).  On arrival pt is hypotensive 80/40, tachycardic to 104, and tachypneic. She is altered. She feels warm, has rectal temperature of 99.2.  Per EMS reports she was altered during transport. Code sepsis initiated and work-up started. Weight based IVF started.  Antibiotics started for unknown source. She is ill appearing. She is extremely pale and dry. On exam  rales heard in left lower lung base, abdomen is non tender.  No gross melena or blood on DRE.  Chest x-ray shows left-sided pleural effusion, appears to have increased compared to prior.  Also shows possible left-sided pneumonia versus basilar atelectasis.  Labs are significant for lactic acidosis of 7.  UA without signs of infection.  Fecal occult is negative however the result has not crossed over into the chart.  This was confirmed by RN and lab.  CBC with hemoglobin of 2.6.  Type and screen pending, plan to transfuse and admit patient for symptomatic anemia.  This case was discussed with Dr. Stark Jock who has seen the patient and agrees with plan to admit. Spoke with Dr. Jonelle Sidle with hospitalist service who agrees to assume care of patient and bring into the hospital for further evaluation and management.    This note was prepared using Dragon voice recognition software and may include unintentional dictation errors due to the inherent limitations of voice recognition software.    Final Clinical Impressions(s) / ED Diagnoses   Final diagnoses:  Symptomatic anemia    ED Discharge Orders    None       Cherre Robins, PA-C 12/29/18 0057    Veryl Speak, MD 12/31/18 [phone]

## 2018-12-29 ENCOUNTER — Inpatient Hospital Stay (HOSPITAL_COMMUNITY): Payer: Medicare Other

## 2018-12-29 DIAGNOSIS — E119 Type 2 diabetes mellitus without complications: Secondary | ICD-10-CM

## 2018-12-29 DIAGNOSIS — I5043 Acute on chronic combined systolic (congestive) and diastolic (congestive) heart failure: Secondary | ICD-10-CM

## 2018-12-29 DIAGNOSIS — I480 Paroxysmal atrial fibrillation: Secondary | ICD-10-CM

## 2018-12-29 DIAGNOSIS — R0602 Shortness of breath: Secondary | ICD-10-CM

## 2018-12-29 DIAGNOSIS — E872 Acidosis: Secondary | ICD-10-CM

## 2018-12-29 DIAGNOSIS — R9389 Abnormal findings on diagnostic imaging of other specified body structures: Secondary | ICD-10-CM

## 2018-12-29 DIAGNOSIS — Z794 Long term (current) use of insulin: Secondary | ICD-10-CM

## 2018-12-29 DIAGNOSIS — E1110 Type 2 diabetes mellitus with ketoacidosis without coma: Secondary | ICD-10-CM

## 2018-12-29 LAB — CBC WITH DIFFERENTIAL/PLATELET
Abs Immature Granulocytes: 0 10*3/uL (ref 0.00–0.07)
Abs Immature Granulocytes: 0 10*3/uL (ref 0.00–0.07)
Abs Immature Granulocytes: 0.25 10*3/uL — ABNORMAL HIGH (ref 0.00–0.07)
Band Neutrophils: 1 %
Basophils Absolute: 0 10*3/uL (ref 0.0–0.1)
Basophils Absolute: 0 10*3/uL (ref 0.0–0.1)
Basophils Absolute: 0 10*3/uL (ref 0.0–0.1)
Basophils Relative: 0 %
Basophils Relative: 0 %
Basophils Relative: 0 %
Eosinophils Absolute: 0 10*3/uL (ref 0.0–0.5)
Eosinophils Absolute: 0 10*3/uL (ref 0.0–0.5)
Eosinophils Absolute: 0 10*3/uL (ref 0.0–0.5)
Eosinophils Relative: 0 %
Eosinophils Relative: 0 %
Eosinophils Relative: 1 %
HCT: 19.2 % — ABNORMAL LOW (ref 36.0–46.0)
HCT: 28.1 % — ABNORMAL LOW (ref 36.0–46.0)
HCT: 26.8 % — ABNORMAL LOW (ref 36.0–46.0)
Hemoglobin: 6.5 g/dL — CL (ref 12.0–15.0)
Hemoglobin: 9.5 g/dL — ABNORMAL LOW (ref 12.0–15.0)
Hemoglobin: 9.1 g/dL — ABNORMAL LOW (ref 12.0–15.0)
Immature Granulocytes: 3 %
Lymphocytes Relative: 14 %
Lymphocytes Relative: 8 %
Lymphocytes Relative: 11 %
Lymphs Abs: 1.1 10*3/uL (ref 0.7–4.0)
Lymphs Abs: 0.6 10*3/uL — ABNORMAL LOW (ref 0.7–4.0)
Lymphs Abs: 0.8 10*3/uL (ref 0.7–4.0)
MCH: 30.7 pg (ref 26.0–34.0)
MCH: 30.1 pg (ref 26.0–34.0)
MCH: 30 pg (ref 26.0–34.0)
MCHC: 33.9 g/dL (ref 30.0–36.0)
MCHC: 33.8 g/dL (ref 30.0–36.0)
MCHC: 34 g/dL (ref 30.0–36.0)
MCV: 90.6 fL (ref 80.0–100.0)
MCV: 88.9 fL (ref 80.0–100.0)
MCV: 88.4 fL (ref 80.0–100.0)
Monocytes Absolute: 0.2 10*3/uL (ref 0.1–1.0)
Monocytes Absolute: 0.1 10*3/uL (ref 0.1–1.0)
Monocytes Absolute: 0.5 10*3/uL (ref 0.1–1.0)
Monocytes Relative: 2 %
Monocytes Relative: 2 %
Monocytes Relative: 6 %
Neutro Abs: 6.4 10*3/uL (ref 1.7–7.7)
Neutro Abs: 6.4 10*3/uL (ref 1.7–7.7)
Neutro Abs: 5.9 10*3/uL (ref 1.7–7.7)
Neutrophils Relative %: 83 %
Neutrophils Relative %: 90 %
Neutrophils Relative %: 79 %
Platelets: 159 10*3/uL (ref 150–400)
Platelets: 138 10*3/uL — ABNORMAL LOW (ref 150–400)
Platelets: 128 10*3/uL — ABNORMAL LOW (ref 150–400)
RBC: 2.12 MIL/uL — ABNORMAL LOW (ref 3.87–5.11)
RBC: 3.16 MIL/uL — ABNORMAL LOW (ref 3.87–5.11)
RBC: 3.03 MIL/uL — ABNORMAL LOW (ref 3.87–5.11)
RDW: 17.4 % — ABNORMAL HIGH (ref 11.5–15.5)
RDW: 16 % — ABNORMAL HIGH (ref 11.5–15.5)
RDW: 16.3 % — ABNORMAL HIGH (ref 11.5–15.5)
WBC: 7.6 10*3/uL (ref 4.0–10.5)
WBC: 7.1 10*3/uL (ref 4.0–10.5)
WBC: 7.5 10*3/uL (ref 4.0–10.5)
nRBC: 6 /100 WBC — ABNORMAL HIGH
nRBC: 6.6 % — ABNORMAL HIGH (ref 0.0–0.2)
nRBC: 11 /100 WBC — ABNORMAL HIGH
nRBC: 6.8 % — ABNORMAL HIGH (ref 0.0–0.2)
nRBC: 5.2 % — ABNORMAL HIGH (ref 0.0–0.2)

## 2018-12-29 LAB — COMPREHENSIVE METABOLIC PANEL WITH GFR
ALT: 14 U/L (ref 0–44)
AST: 39 U/L (ref 15–41)
Albumin: 2.5 g/dL — ABNORMAL LOW (ref 3.5–5.0)
Alkaline Phosphatase: 59 U/L (ref 38–126)
Anion gap: 12 (ref 5–15)
BUN: 29 mg/dL — ABNORMAL HIGH (ref 8–23)
CO2: 23 mmol/L (ref 22–32)
Calcium: 6.6 mg/dL — ABNORMAL LOW (ref 8.9–10.3)
Chloride: 109 mmol/L (ref 98–111)
Creatinine, Ser: 1.08 mg/dL — ABNORMAL HIGH (ref 0.44–1.00)
GFR calc Af Amer: 58 mL/min — ABNORMAL LOW
GFR calc non Af Amer: 50 mL/min — ABNORMAL LOW
Glucose, Bld: 204 mg/dL — ABNORMAL HIGH (ref 70–99)
Potassium: 3 mmol/L — ABNORMAL LOW (ref 3.5–5.1)
Sodium: 144 mmol/L (ref 135–145)
Total Bilirubin: 0.9 mg/dL (ref 0.3–1.2)
Total Protein: 4.5 g/dL — ABNORMAL LOW (ref 6.5–8.1)

## 2018-12-29 LAB — GLUCOSE, CAPILLARY
Glucose-Capillary: 227 mg/dL — ABNORMAL HIGH (ref 70–99)
Glucose-Capillary: 172 mg/dL — ABNORMAL HIGH (ref 70–99)

## 2018-12-29 LAB — HEMOGLOBIN AND HEMATOCRIT, BLOOD
HCT: 26.3 % — ABNORMAL LOW (ref 36.0–46.0)
HCT: 22.6 % — ABNORMAL LOW (ref 36.0–46.0)
Hemoglobin: 9.1 g/dL — ABNORMAL LOW (ref 12.0–15.0)
Hemoglobin: 7.8 g/dL — ABNORMAL LOW (ref 12.0–15.0)

## 2018-12-29 LAB — IRON AND TIBC
Iron: 68 ug/dL (ref 28–170)
Saturation Ratios: 23 % (ref 10.4–31.8)
TIBC: 291 ug/dL (ref 250–450)
UIBC: 223 ug/dL

## 2018-12-29 LAB — CBG MONITORING, ED
Glucose-Capillary: 284 mg/dL — ABNORMAL HIGH (ref 70–99)
Glucose-Capillary: 229 mg/dL — ABNORMAL HIGH (ref 70–99)

## 2018-12-29 LAB — VITAMIN B12: Vitamin B-12: 158 pg/mL — ABNORMAL LOW (ref 180–914)

## 2018-12-29 LAB — POC OCCULT BLOOD, ED: Fecal Occult Bld: POSITIVE — AB

## 2018-12-29 LAB — LACTIC ACID, PLASMA: Lactic Acid, Venous: 2 mmol/L (ref 0.5–1.9)

## 2018-12-29 LAB — PREPARE RBC (CROSSMATCH)

## 2018-12-29 MED ORDER — INSULIN ASPART 100 UNIT/ML ~~LOC~~ SOLN
0.0000 [IU] | Freq: Every day | SUBCUTANEOUS | Status: DC
  Administered 2018-12-30: 2 [IU] via SUBCUTANEOUS
  Administered 2019-01-01: 3 [IU] via SUBCUTANEOUS
  Administered 2019-01-02: 2 [IU] via SUBCUTANEOUS

## 2018-12-29 MED ORDER — PANTOPRAZOLE SODIUM 40 MG IV SOLR
40.0000 mg | Freq: Two times a day (BID) | INTRAVENOUS | Status: DC
  Administered 2018-12-29 – 2019-01-03 (×11): 40 mg via INTRAVENOUS
  Filled 2018-12-29 (×11): qty 40

## 2018-12-29 MED ORDER — ATORVASTATIN CALCIUM 10 MG PO TABS
20.0000 mg | ORAL_TABLET | Freq: Every day | ORAL | Status: DC
  Administered 2018-12-29 – 2019-01-02 (×5): 20 mg via ORAL
  Filled 2018-12-29 (×5): qty 2

## 2018-12-29 MED ORDER — SODIUM CHLORIDE 0.9 % IV SOLN
2.0000 g | INTRAVENOUS | Status: DC
  Administered 2018-12-29 – 2019-01-03 (×6): 2 g via INTRAVENOUS
  Filled 2018-12-29 (×5): qty 2
  Filled 2018-12-29: qty 20

## 2018-12-29 MED ORDER — AMIODARONE HCL 200 MG PO TABS: 200.0000 mg | ORAL_TABLET | Freq: Two times a day (BID) | ORAL | Status: DC

## 2018-12-29 MED ORDER — INSULIN ASPART 100 UNIT/ML ~~LOC~~ SOLN
0.0000 [IU] | Freq: Three times a day (TID) | SUBCUTANEOUS | Status: DC
  Administered 2018-12-29 (×2): 3 [IU] via SUBCUTANEOUS
  Administered 2018-12-29: 5 [IU] via SUBCUTANEOUS
  Administered 2018-12-30 – 2018-12-31 (×4): 1 [IU] via SUBCUTANEOUS
  Administered 2018-12-31: 2 [IU] via SUBCUTANEOUS
  Administered 2019-01-01: 3 [IU] via SUBCUTANEOUS
  Administered 2019-01-01: 7 [IU] via SUBCUTANEOUS
  Administered 2019-01-02: 2 [IU] via SUBCUTANEOUS
  Administered 2019-01-02: 7 [IU] via SUBCUTANEOUS
  Administered 2019-01-02 – 2019-01-03 (×2): 5 [IU] via SUBCUTANEOUS

## 2018-12-29 MED ORDER — FUROSEMIDE 10 MG/ML IJ SOLN
INTRAMUSCULAR | Status: AC
  Administered 2018-12-29: 40 mg via INTRAVENOUS
  Filled 2018-12-29: qty 2

## 2018-12-29 MED ORDER — POTASSIUM CHLORIDE CRYS ER 20 MEQ PO TBCR
40.0000 meq | EXTENDED_RELEASE_TABLET | ORAL | Status: AC
  Administered 2018-12-29 – 2018-12-30 (×3): 40 meq via ORAL
  Filled 2018-12-29 (×3): qty 2

## 2018-12-29 MED ORDER — SIMVASTATIN 20 MG PO TABS: 40.0000 mg | ORAL_TABLET | Freq: Every day | ORAL | Status: DC

## 2018-12-29 MED ORDER — METOPROLOL SUCCINATE ER 25 MG PO TB24
25.0000 mg | ORAL_TABLET | Freq: Every day | ORAL | Status: DC
  Administered 2018-12-29 – 2019-01-03 (×4): 25 mg via ORAL
  Filled 2018-12-29 (×6): qty 1

## 2018-12-29 MED ORDER — FUROSEMIDE 10 MG/ML IJ SOLN
40.0000 mg | Freq: Once | INTRAMUSCULAR | Status: AC
  Administered 2018-12-29 (×2): 40 mg via INTRAVENOUS

## 2018-12-29 MED ORDER — VITAMIN D 25 MCG (1000 UNIT) PO TABS
2000.0000 [IU] | ORAL_TABLET | Freq: Every day | ORAL | Status: DC
  Administered 2018-12-29 – 2019-01-03 (×6): 2000 [IU] via ORAL
  Filled 2018-12-29 (×6): qty 2

## 2018-12-29 MED ORDER — SODIUM CHLORIDE 0.9 % IV SOLN
250.0000 mL | INTRAVENOUS | Status: DC | PRN
  Administered 2018-12-31: 250 mL via INTRAVENOUS

## 2018-12-29 MED ORDER — SODIUM CHLORIDE 0.9 % IV SOLN
INTRAVENOUS | Status: DC
  Administered 2018-12-29: 18:00:00 via INTRAVENOUS

## 2018-12-29 MED ORDER — OMEGA-3-ACID ETHYL ESTERS 1 G PO CAPS
1.0000 g | ORAL_CAPSULE | Freq: Two times a day (BID) | ORAL | Status: DC
  Administered 2018-12-29 – 2019-01-03 (×11): 1 g via ORAL
  Filled 2018-12-29 (×12): qty 1

## 2018-12-29 MED ORDER — CAPECITABINE 500 MG PO TABS: 1500.0000 mg | ORAL_TABLET | Freq: Two times a day (BID) | ORAL | Status: DC

## 2018-12-29 MED ORDER — GABAPENTIN 100 MG PO CAPS
100.0000 mg | ORAL_CAPSULE | Freq: Two times a day (BID) | ORAL | Status: DC
  Administered 2018-12-29 – 2019-01-03 (×11): 100 mg via ORAL
  Filled 2018-12-29 (×11): qty 1

## 2018-12-29 MED ORDER — FERROUS SULFATE 325 (65 FE) MG PO TABS: 325.0000 mg | ORAL_TABLET | Freq: Every day | ORAL | Status: DC | PRN

## 2018-12-29 MED ORDER — SALINE SPRAY 0.65 % NA SOLN
1.0000 | NASAL | Status: DC | PRN
  Filled 2018-12-29: qty 44

## 2018-12-29 MED ORDER — FUROSEMIDE 10 MG/ML IJ SOLN
40.0000 mg | Freq: Once | INTRAMUSCULAR | Status: AC
  Administered 2018-12-29: 40 mg via INTRAVENOUS
  Filled 2018-12-29: qty 4

## 2018-12-29 MED ORDER — SODIUM CHLORIDE 0.9% FLUSH
3.0000 mL | Freq: Two times a day (BID) | INTRAVENOUS | Status: DC
  Administered 2018-12-29 – 2019-01-01 (×5): 3 mL via INTRAVENOUS
  Administered 2019-01-01: 10 mL via INTRAVENOUS
  Administered 2019-01-02 – 2019-01-03 (×2): 3 mL via INTRAVENOUS

## 2018-12-29 MED ORDER — PROCHLORPERAZINE MALEATE 10 MG PO TABS
10.0000 mg | ORAL_TABLET | Freq: Four times a day (QID) | ORAL | Status: DC | PRN
  Filled 2018-12-29: qty 1

## 2018-12-29 MED ORDER — CALCIUM CARBONATE-VITAMIN D 500-200 MG-UNIT PO TABS
1.0000 | ORAL_TABLET | Freq: Two times a day (BID) | ORAL | Status: DC
  Administered 2018-12-29 – 2019-01-03 (×10): 1 via ORAL
  Filled 2018-12-29 (×11): qty 1

## 2018-12-29 MED ORDER — MAGNESIUM OXIDE 400 (241.3 MG) MG PO TABS
200.0000 mg | ORAL_TABLET | Freq: Every day | ORAL | Status: DC
  Administered 2018-12-29 – 2019-01-02 (×5): 200 mg via ORAL
  Filled 2018-12-29 (×5): qty 1

## 2018-12-29 MED ORDER — IPRATROPIUM-ALBUTEROL 0.5-2.5 (3) MG/3ML IN SOLN
3.0000 mL | Freq: Four times a day (QID) | RESPIRATORY_TRACT | Status: DC
  Administered 2018-12-29 – 2018-12-30 (×6): 3 mL via RESPIRATORY_TRACT
  Filled 2018-12-29 (×6): qty 3

## 2018-12-29 MED ORDER — CALCIUM GLUCONATE-NACL 1-0.675 GM/50ML-% IV SOLN
1.0000 g | Freq: Once | INTRAVENOUS | Status: AC
  Administered 2018-12-29: 1000 mg via INTRAVENOUS
  Filled 2018-12-29: qty 50

## 2018-12-29 MED ORDER — ONDANSETRON HCL 4 MG PO TABS: 8.0000 mg | ORAL_TABLET | Freq: Two times a day (BID) | ORAL | Status: DC | PRN

## 2018-12-29 MED ORDER — AMIODARONE HCL 200 MG PO TABS
200.0000 mg | ORAL_TABLET | Freq: Every day | ORAL | Status: DC
  Administered 2018-12-29 – 2019-01-03 (×6): 200 mg via ORAL
  Filled 2018-12-29 (×6): qty 1

## 2018-12-29 MED ORDER — SODIUM CHLORIDE 0.9% IV SOLUTION
Freq: Once | INTRAVENOUS | Status: AC
  Administered 2018-12-29: 07:00:00 via INTRAVENOUS

## 2018-12-29 MED ORDER — SODIUM CHLORIDE 0.9% FLUSH: 3.0000 mL | INTRAVENOUS | Status: DC | PRN

## 2018-12-29 NOTE — Progress Notes (Signed)
PROGRESS NOTE  Brittany Porter [facility]:[id number] DOB: 07-11-1945   PCP: [name], MD  Patient is from: Home.  Recently started using walker  DOA: 12/28/2018 LOS: 1  Brief Narrative / Interim history: 73 year old female with history of bilateral breast cancer/bilateral mastectomy with bone, liver and lung mets currently on Xeloda, ovarian and endometrial cancer, IDDM-2, HTN, A. fib on Xarelto, chronic respiratory failure on 2 to 4 L, and chronic pain on occasional NSAIDs presenting with generalized weakness, DOE and palpitation, and admitted for severe symptomatic anemia with hemoglobin of 2.7.  Denies melena or hematochezia.  Mild temp to 99.2.  BP 84/47.  Pulse ox 97.  RR 30.  100% on 4 L.  Hgb 2.6.  WBC 7.6.  Platelet 185.  INR 3.1.  Creatinine 1.07.  BUN 31.  Glucose 259.  Bicarb 15.  AG 16.  LA 7.0.  CXR with stable small to moderate sized left pleural effusion, bibasilar atelectasis versus pneumonia.  Admitted for symptomatic anemia.  4 units of blood ordered.  Also started on broad-spectrum antibiotics for possible pneumonia.  Subjective: Patient had an episode of worsening dyspnea after IV fluid and blood transfusion.  She was given Lasix with improvement in her breathing.  She has upper extremity edema this morning.  Reports improvement in his symptoms and weakness.  Denies melena or hematochezia.  Denies chest pain, cough, fever, GI or GU symptoms.  Daughter at bedside.  Both patient and daughter confirms full code pending discussion with patient's husband.  Objective: Vitals:   12/29/18 0941 12/29/18 0942 12/29/18 0945 12/29/18 1001  BP: 105/72  (!) 118/97 (!) 123/41  Pulse: 90 87 90 95  Resp: (!) 35 (!) 26 (!) 30 (!) 29  Temp:      TempSrc:      SpO2: 100% 93% 99% 98%  Weight:      Height:        Intake/Output Summary (Last 24 hours) at 12/29/2018 1120 Last data filed at 12/29/2018 0958 Gross per 24 hour  Intake 1525.42 ml  Output -  Net 1525.42 ml   Filed Weights    12/28/18 1846  Weight: 81.6 kg    Examination:  GENERAL: No acute distress.  Appears well.  HEENT: MMM.  Vision and hearing grossly intact.  Sclera anicteric. NECK: Supple.  No apparent JVD.  RESP:  No IWOB.  Poor inspiratory effort but fair aeration.  Bibasilar crackles. CVS:  RRR. Heart sounds normal.  ABD/GI/GU: Bowel sounds present. Soft. Non tender.  MSK/EXT:  Moves extremities.  1+ upper and lower extremity edema. SKIN: no apparent skin lesion or wound NEURO: Awake, alert and oriented appropriately.  No gross deficit.  PSYCH: Calm. Normal affect.    I have personally reviewed the following labs and images:  Radiology Studies: Dg Chest Port 1 View  Result Date: 12/29/2018 CLINICAL DATA:  Acute respiratory distress. Recent transfusion. EXAM: PORTABLE CHEST 1 VIEW COMPARISON:  12/28/2018 FINDINGS: Cardiomegaly remains stable. Small to moderate left pleural effusion and left retrocardiac atelectasis or infiltrate show no significant change. Probable tiny right pleural effusion. Surgical clips again seen in both axillary regions. IMPRESSION: 1. No significant change in small to moderate left pleural effusion and left retrocardiac atelectasis versus infiltrate. 2. Stable cardiomegaly. Probable tiny right pleural effusion. Electronically Signed   By: Marlaine Hind M.D.   On: 12/29/2018 05:27   Dg Chest Portable 1 View  Result Date: 12/28/2018 CLINICAL DATA:  Nausea, vomiting and intermittent shortness of breath. EXAM: PORTABLE CHEST  1 VIEW COMPARISON:  11/27/2018. FINDINGS: The cardiac silhouette remains mildly enlarged. Small to moderate-sized left pleural effusion, increased. Increased left basilar airspace opacity. Minimal right basilar atelectasis with improvement. Stable diffuse peribronchial thickening and accentuation of the interstitial markings. Bilateral postmastectomy changes with bilateral axillary surgical clips. IMPRESSION: 1. Small to moderate-sized left pleural effusion,  increased. 2. Increased left basilar atelectasis or pneumonia. 3. Minimal right basilar atelectasis with improvement. 4. Stable mild cardiomegaly and chronic bronchitic changes. Electronically Signed   By: Claudie Revering M.D.   On: 12/28/2018 21:53    Microbiology: Recent Results (from the past 240 hour(s))  SARS Coronavirus 2 [facility] order, Performed in [facility] hospital lab) Nasopharyngeal Nasopharyngeal Swab     Status: None   Collection Time: 12/28/18  9:48 PM   Specimen: Nasopharyngeal Swab  Result Value Ref Range Status   SARS Coronavirus 2 NEGATIVE NEGATIVE Final    Comment: (NOTE) If result is NEGATIVE SARS-CoV-2 target nucleic acids are NOT DETECTED. The SARS-CoV-2 RNA is generally detectable in upper and lower  respiratory specimens during the acute phase of infection. The lowest  concentration of SARS-CoV-2 viral copies this assay can detect is 250  copies / mL. A negative result does not preclude SARS-CoV-2 infection  and should not be used as the sole basis for treatment or other  patient management decisions.  A negative result may occur with  improper specimen collection / handling, submission of specimen other  than nasopharyngeal swab, presence of viral mutation(s) within the  areas targeted by this assay, and inadequate number of viral copies  (<250 copies / mL). A negative result must be combined with clinical  observations, patient history, and epidemiological information. If result is POSITIVE SARS-CoV-2 target nucleic acids are DETECTED. The SARS-CoV-2 RNA is generally detectable in upper and lower  respiratory specimens dur ing the acute phase of infection.  Positive  results are indicative of active infection with SARS-CoV-2.  Clinical  correlation with patient history and other diagnostic information is  necessary to determine patient infection status.  Positive results do  not rule out bacterial infection or co-infection with other viruses. If result is  PRESUMPTIVE POSTIVE SARS-CoV-2 nucleic acids MAY BE PRESENT.   A presumptive positive result was obtained on the submitted specimen  and confirmed on repeat testing.  While 2019 novel coronavirus  (SARS-CoV-2) nucleic acids may be present in the submitted sample  additional confirmatory testing may be necessary for epidemiological  and / or clinical management purposes  to differentiate between  SARS-CoV-2 and other Sarbecovirus currently known to infect humans.  If clinically indicated additional testing with an alternate test  methodology [id number]) is advised. The SARS-CoV-2 RNA is generally  detectable in upper and lower respiratory sp ecimens during the acute  phase of infection. The expected result is Negative. Fact Sheet for Patients:  StrictlyIdeas.no Fact Sheet for Healthcare Providers: BankingDealers.co.za This test is not yet approved or cleared by the [place] FDA and has been authorized for detection and/or diagnosis of SARS-CoV-2 by FDA under an Emergency Use Authorization (EUA).  This EUA will remain in effect (meaning this test can be used) for the duration of the COVID-19 declaration under Section 564(b)(1) of the Act, 21 U.S.C. section 360bbb-3(b)(1), unless the authorization is terminated or revoked sooner. Performed at [facility], [place] [address], [place], [place] [address]     Sepsis Labs: Invalid input(s): PROCALCITONIN, LACTICIDVEN  Urine analysis:    Component Value Date/Time   COLORURINE YELLOW  12/28/2018 2148   APPEARANCEUR CLEAR 12/28/2018 2148   LABSPEC 1.015 12/28/2018 2148   PHURINE 5.0 12/28/2018 2148   GLUCOSEU 50 (A) 12/28/2018 2148   HGBUR NEGATIVE 12/28/2018 2148   [place] NEGATIVE 12/28/2018 2148   KETONESUR 5 (A) 12/28/2018 2148   PROTEINUR NEGATIVE 12/28/2018 2148   UROBILINOGEN 0.2 05/15/2012 0221   NITRITE NEGATIVE 12/28/2018 2148   LEUKOCYTESUR NEGATIVE 12/28/2018 2148     Anemia Panel: No results for input(s): VITAMINB12, FOLATE, FERRITIN, TIBC, IRON, RETICCTPCT in the last 72 hours.  Thyroid Function Tests: No results for input(s): TSH, T4TOTAL, FREET4, T3FREE, THYROIDAB in the last 72 hours.  Lipid Profile: No results for input(s): CHOL, HDL, LDLCALC, TRIG, CHOLHDL, LDLDIRECT in the last 72 hours.  CBG: Recent Labs  Lab 12/29/18 0814  GLUCAP 284*    HbA1C: No results for input(s): HGBA1C in the last 72 hours.  BNP (last 3 results): No results for input(s): PROBNP in the last 8760 hours.  Cardiac Enzymes: No results for input(s): CKTOTAL, CKMB, CKMBINDEX, TROPONINI in the last 168 hours.  Coagulation Profile: Recent Labs  Lab 12/28/18 2140  INR 3.1*    Liver Function Tests: Recent Labs  Lab 12/28/18 1826  AST 35  ALT 11  ALKPHOS 52  BILITOT 1.0  PROT 4.0*  ALBUMIN 2.2*   No results for input(s): LIPASE, AMYLASE in the last 168 hours. No results for input(s): AMMONIA in the last 168 hours.  Basic Metabolic Panel: Recent Labs  Lab 12/28/18 1826  NA 141  K 4.2  CL 110  CO2 15*  GLUCOSE 259*  BUN 31*  CREATININE 1.07*  CALCIUM 6.5*   GFR: Estimated Creatinine Clearance: 47 mL/min (A) (by C-G formula based on SCr of 1.07 mg/dL (H)).  CBC: Recent Labs  Lab 12/28/18 1935 12/29/18 0725  WBC 7.6 7.6  NEUTROABS 6.3 6.4  HGB 2.6* 6.5*  HCT 9.1* 19.2*  MCV 104.6* 90.6  PLT 185 159    Procedures:  None  Microbiology summarized: SARS-CoV-2 negative. Blood cultures pending  Assessment & Plan: Severe symptomatic anemia: Patient denies melena or hematochezia.  She is on Xarelto and occasional NSAID concerning for GI bleed.  She is also on Xeloda which could contribute.  No signs of hemolysis. -Hgb 10 (and 6/20)>> 2.6 (admit)> 2 units> 6.5 -Anemia panel pending. -Check Hemoccult -[facility], Dr. Meyer Cory- EGD 8/16 -Oncology, Dr. Providence Lanius, exclude GIB and outpatient follow-up if Hgb stable -Monitor  H&H. -Hold Xarelto. -Counseled on NSAIDs. -PPI  Acute combined CHF exacerbation: Echo in 01/2018 with EF of 45 to 50%, diffuse hypokinesis and G2 DD.  Exacerbation likely due to severe anemia, IV fluid resuscitation and blood transfusion. -IV Lasix 40 mg twice daily -Daily weight, intake output and renal functions -Salt and fluid restriction -Update echocardiogram.  Abnormal chest x-ray: Portable CXR revealed stable small to moderate left pleural effusion with left retrocardiac atelectasis versus infiltrate.  I suspect this to be due to cancer, anemia and CHF versus infectious process. -We will narrow antibiotics to ceftriaxone. -Manage anemia and CHF as above.  History of bilateral breast cancer/bilateral mastectomy with metastasis to liver, bone and lung.  Followed by Dr. Lindi Adie -Discussed with Dr. Alen Blew who recommended holding Xeloda in the setting of severe anemia.  Mild DKA in patient with IDDM-2/hyperglycemia: She has already been resuscitated with IV fluid.  Acidosis could also be due to severe lactic acidosis in the setting of severe anemia. -Continue sliding scale for now.  Will convert to insulin  drip if AG remains open. -Check A1c -CBG monitoring  Lactic acidosis: Likely due to #1 versus infectious process.  Resolved.  Coagulopathy: PT/INR elevated.  No signs of hemolysis.  LFT and T bili within normal range. -Continue holding Xarelto -Recheck  Paroxysmal atrial fibrillation: Currently in normal sinus rhythm on telemetry.  Last EKG on 11/05/2018 with sinus rhythm.  She is on amiodarone 200 mg twice daily at home -We will continue amiodarone 200 mg daily here -Hold Xarelto until we exclude GI bleed.   DVT prophylaxis: SCD Code Status: Full code-confirmed with patient and patient's daughter Family Communication: Patient's daughter at bedside Disposition Plan: Remains inpatient Consultants: Gastroenterology and oncology   Antimicrobials: Anti-infectives (From  admission, onward)   Start     Dose/Rate Route Frequency Ordered Stop   12/29/18 2000  vancomycin (VANCOCIN) IVPB 750 mg/150 ml premix  Status:  Discontinued     750 mg 150 mL/hr over 60 Minutes Intravenous Every 24 hours 12/28/18 1952 12/29/18 1101   12/29/18 1115  cefTRIAXone (ROCEPHIN) 2 g in sodium chloride 0.9 % 100 mL IVPB     2 g 200 mL/hr over 30 Minutes Intravenous Every 24 hours 12/29/18 1102     12/29/18 0800  ceFEPIme (MAXIPIME) 2 g in sodium chloride 0.9 % 100 mL IVPB  Status:  Discontinued     2 g 200 mL/hr over 30 Minutes Intravenous Every 12 hours 12/28/18 1952 12/29/18 1101   12/28/18 1915  vancomycin (VANCOCIN) 1,500 mg in sodium chloride 0.9 % 500 mL IVPB     1,500 mg 250 mL/hr over 120 Minutes Intravenous  Once 12/28/18 1904 12/29/18 0116   12/28/18 1900  ceFEPIme (MAXIPIME) 2 g in sodium chloride 0.9 % 100 mL IVPB     2 g 200 mL/hr over 30 Minutes Intravenous  Once 12/28/18 1847 12/28/18 2007   12/28/18 1900  metroNIDAZOLE (FLAGYL) IVPB 500 mg     500 mg 100 mL/hr over 60 Minutes Intravenous  Once 12/28/18 1847 12/28/18 2118   12/28/18 1900  vancomycin (VANCOCIN) IVPB 1000 mg/200 mL premix  Status:  Discontinued     1,000 mg 200 mL/hr over 60 Minutes Intravenous  Once 12/28/18 1847 12/28/18 1904      Sch Meds:  Scheduled Meds: . amiodarone  200 mg Oral Daily  . calcium-vitamin D  1 tablet Oral BID WC  . cholecalciferol  2,000 Units Oral Daily  . gabapentin  100 mg Oral BID  . insulin aspart  0-5 Units Subcutaneous QHS  . insulin aspart  0-9 Units Subcutaneous TID WC  . ipratropium-albuterol  3 mL Inhalation Q6H  . magnesium oxide  200 mg Oral QHS  . metoprolol succinate  25 mg Oral Daily  . omega-3 acid ethyl esters  1 g Oral BID  . pantoprazole (PROTONIX) IV  40 mg Intravenous Q12H  . simvastatin  40 mg Oral QHS  . sodium chloride flush  3 mL Intravenous Q12H   Continuous Infusions: . sodium chloride    . sodium chloride    . cefTRIAXone (ROCEPHIN)   IV 2 g (12/29/18 1118)   PRN Meds:.sodium chloride, ferrous sulfate, ondansetron, prochlorperazine, sodium chloride, sodium chloride flush  35 minutes with more than 50% spent in reviewing records, counseling patient and coordinating care.   T. [place]  If 7PM-7AM, please contact night-coverage [email or web address] Password [facility] 12/29/2018, 11:20 AM

## 2018-12-29 NOTE — Progress Notes (Signed)
Notified RN and MD regarding 2nd set of lactate not drawn. Pt had been receiving most of the night due to very low hemoglobin.(2.6). MD did not thonk that this pt is septic.

## 2018-12-29 NOTE — H&P (View-Only) (Signed)
Referring Provider: Dr. Bettina Gavia Primary Care Physician:  [name], MD Primary Gastroenterologist:  Dr. Penelope Coop  Reason for Consultation: Profound anemia with melenic stool  HPI: Brittany Porter is a 73 y.o. female being admitted through the emergency room today because of severe dyspnea, associated with a roughly 1 month history of dark stools, and associated with a hemoglobin of 2.6 as compared to 10.0 when checked 6 weeks earlier.  Her stool is dark, almost black, maroon-tinged, and confirmed to be Hemoccult positive.  She has been transfused to a hemoglobin of 9.5 and is feeling substantially better.  Her BUN of 31 reflects a moderate rise from baseline of 20.  She has been on Xarelto for the past several months because of atrial fibrillation, and also has been using naproxen on a regular basis because of pain she has a history of metastatic breast cancer, as well as remote history of ovarian and endometrial cancer.    She has a sister with colon cancer; the patient underwent screening colonoscopy on 2 occasions by Dr. Acquanetta Sit, in 2011 and more recently in September 2016, with both exams negative except for diverticulosis.  She does not have any significant active upper or lower GI tract symptoms, apart from the frequent dark, loose stools.   Past Medical History:  Diagnosis Date  . Arthritis   . Breast cancer ([place])   . Diabetes mellitus   . Endometrial carcinoma ([place]) 10/2008  . Family history of breast cancer   . Family history of colon cancer   . Family history of skin cancer   . Fluid overload   . History of radiation therapy 04/13/2009, 04/16/2009, 04/27/2009, 05/07/2009, 05/18/2009   3000 cGy to proximal vagina  . Hyperlipidemia   . Iron deficiency   . Melanoma ([place]) 2010   rt arm and back  . Ovarian cancer ([place])   . Pleural effusion 04/2010    Past Surgical History:  Procedure Laterality Date  . ABDOMINAL HYSTERECTOMY  10/2008  . APPENDECTOMY    . INSERTION OF MESH   05/28/2012   Procedure: INSERTION OF MESH;  Surgeon: Adin Hector, MD;  Location: [facility];  Service: General;  Laterality: N/A;  . KNEE ARTHROSCOPY  04/2010   Right knee  . LAPAROSCOPIC LYSIS OF ADHESIONS  05/28/2012   Procedure: LAPAROSCOPIC LYSIS OF ADHESIONS;  Surgeon: Adin Hector, MD;  Location: [facility];  Service: General;  Laterality: N/A;  . MASTECTOMY  1997   Bilateral with lymph nodes  . Melanoma removal  02/2009, 07/2009  . VENTRAL HERNIA REPAIR  05/28/2012   Procedure: LAPAROSCOPIC VENTRAL HERNIA;  Surgeon: Adin Hector, MD;  Location: [facility];  Service: General;  Laterality: N/A;    Prior to Admission medications   Medication Sig Start Date End Date Taking? Authorizing Provider  amiodarone (PACERONE) 200 MG tablet START BY TAKING 1 TABLET BY MOUTH TWICE DAILY, THEN REDUCE TO 1 TABLET BY MOUTH ONCE DAILY Patient taking differently: Take 200 mg by mouth daily.  11/28/18  Yes Elouise Munroe, MD  Calcium Carb-Cholecalciferol (CALCIUM 600+D3) 600-800 MG-UNIT TABS Take 1 tablet by mouth 2 (two) times daily.    Yes [provider]  capecitabine (XELODA) 500 MG tablet Take 3 tablets (1,500 mg total) by mouth 2 (two) times daily after a meal. Take for 14 days on, 7 days off, repeat every 21 days 12/07/18  Yes Nicholas Lose, MD  Cholecalciferol (VITAMIN D3) 2000 UNITS TABS Take 2,000 mg by mouth daily.  Yes [provider]  ferrous sulfate 325 (65 FE) MG tablet Take 325 mg by mouth daily as needed (energy).    Yes [provider]  fish oil-omega-3 fatty acids 1000 MG capsule Take 1 g by mouth 2 (two) times daily.    Yes [provider]  furosemide (LASIX) 20 MG tablet Take 20 mg by mouth daily as needed for fluid or edema. Reported on 05/07/2015 10/08/14  Yes [provider]  gabapentin (NEURONTIN) 100 MG capsule Take 100 mg by mouth 2 (two) times daily. 07/27/18  Yes [provider]  ibuprofen (ADVIL,MOTRIN) 200 MG tablet Take 400 mg  by mouth every 6 (six) hours as needed for headache (pain). Reported on 11/12/2015   Yes [provider]  Ipratropium-Albuterol (COMBIVENT RESPIMAT) 20-100 MCG/ACT AERS respimat Inhale 1 puff into the lungs every 6 (six) hours. Patient taking differently: Inhale 1 puff into the lungs every 6 (six) hours as needed for wheezing or shortness of breath.  11/27/18  Yes Margaretha Seeds, MD  lidocaine-prilocaine (EMLA) cream Apply to affected area once Patient taking differently: Apply 1 application topically daily as needed (pain). Apply to affected area once 09/24/18  Yes Nicholas Lose, MD  Magnesium Oxide (MAG-OXIDE) 200 MG TABS Take 1 tablet (200 mg total) by mouth daily. Patient taking differently: Take 200 mg by mouth at bedtime.  06/06/18  Yes Isla Pence, MD  metFORMIN (GLUCOPHAGE) 500 MG tablet Take 2 tablets (1,000 mg total) by mouth 2 (two) times daily with a meal for 30 days. 08/24/18 12/28/18 Yes Long, Wonda Olds, MD  metoprolol succinate (TOPROL-XL) 25 MG 24 hr tablet Take 1 tablet (25 mg total) by mouth daily. 10/15/18  Yes Elouise Munroe, MD  naproxen sodium (ALEVE) 220 MG tablet Take 220 mg by mouth daily as needed (pain).   Yes [provider]  ondansetron (ZOFRAN) 8 MG tablet Take 1 tablet (8 mg total) by mouth 2 (two) times daily as needed (Nausea or vomiting). 09/24/18  Yes Nicholas Lose, MD  OXYGEN Inhale 4 L into the lungs continuous.    Yes [provider]  prochlorperazine (COMPAZINE) 10 MG tablet Take 1 tablet (10 mg total) by mouth every 6 (six) hours as needed (Nausea or vomiting). 09/24/18  Yes Nicholas Lose, MD  rivaroxaban (XARELTO) 20 MG TABS tablet Take 1 tablet (20 mg total) by mouth daily with supper. 10/15/18  Yes Elouise Munroe, MD  simvastatin (ZOCOR) 40 MG tablet Take 40 mg by mouth at bedtime.     Yes [provider]  sodium chloride (OCEAN) 0.65 % SOLN nasal spray Place 1 spray into both nostrils as needed for congestion.   Yes  [provider]    Current Facility-Administered Medications  Medication Dose Route Frequency Provider Last Rate Last Dose  . 0.9 %  sodium chloride infusion  10 mL/hr Intravenous Once Albrizze, Kaitlyn E, PA-C      . 0.9 %  sodium chloride infusion  250 mL Intravenous PRN Gala Romney L, MD      . amiodarone (PACERONE) tablet 200 mg  200 mg Oral Daily Wendee Beavers T, MD   200 mg at 12/29/18 0956  . atorvastatin (LIPITOR) tablet 20 mg  20 mg Oral q1800 Gonfa, Taye T, MD      . calcium-vitamin D (OSCAL WITH D) 500-200 MG-UNIT per tablet 1 tablet  1 tablet Oral BID WC Elwyn Reach, MD   1 tablet at 12/29/18 1328  . cefTRIAXone (ROCEPHIN)  2 g in sodium chloride 0.9 % 100 mL IVPB  2 g Intravenous Q24H Mercy Riding, MD   Stopped at 12/29/18 1148  . cholecalciferol (VITAMIN D3) tablet 2,000 Units  2,000 Units Oral Daily Elwyn Reach, MD   2,000 Units at 12/29/18 0956  . ferrous sulfate tablet 325 mg  325 mg Oral Daily PRN Gala Romney L, MD      . gabapentin (NEURONTIN) capsule 100 mg  100 mg Oral BID Gala Romney L, MD   100 mg at 12/29/18 0956  . insulin aspart (novoLOG) injection 0-5 Units  0-5 Units Subcutaneous QHS Garba, Mohammad L, MD      . insulin aspart (novoLOG) injection 0-9 Units  0-9 Units Subcutaneous TID WC Elwyn Reach, MD   3 Units at 12/29/18 1228  . ipratropium-albuterol (DUONEB) 0.5-2.5 (3) MG/3ML nebulizer solution 3 mL  3 mL Inhalation Q6H Garba, Mohammad L, MD   3 mL at 12/29/18 1329  . magnesium oxide (MAG-OX) tablet 200 mg  200 mg Oral QHS Garba, Mohammad L, MD      . metoprolol succinate (TOPROL-XL) 24 hr tablet 25 mg  25 mg Oral Daily Elwyn Reach, MD   25 mg at 12/29/18 0956  . omega-3 acid ethyl esters (LOVAZA) capsule 1 g  1 g Oral BID Gala Romney L, MD   1 g at 12/29/18 1329  . ondansetron (ZOFRAN) tablet 8 mg  8 mg Oral BID PRN Gala Romney L, MD      . pantoprazole (PROTONIX) injection 40 mg  40 mg Intravenous Q12H Wendee Beavers T, MD   40 mg at 12/29/18 1223  . prochlorperazine (COMPAZINE) tablet 10 mg  10 mg Oral Q6H PRN Gala Romney L, MD      . sodium chloride (OCEAN) 0.65 % nasal spray 1 spray  1 spray Each Nare PRN Jonelle Sidle, Mohammad L, MD      . sodium chloride flush (NS) 0.9 % injection 3 mL  3 mL Intravenous Q12H Garba, Mohammad L, MD      . sodium chloride flush (NS) 0.9 % injection 3 mL  3 mL Intravenous PRN Elwyn Reach, MD       Current Outpatient Medications  Medication Sig Dispense Refill  . amiodarone (PACERONE) 200 MG tablet START BY TAKING 1 TABLET BY MOUTH TWICE DAILY, THEN REDUCE TO 1 TABLET BY MOUTH ONCE DAILY (Patient taking differently: Take 200 mg by mouth daily. ) 180 tablet 0  . Calcium Carb-Cholecalciferol (CALCIUM 600+D3) 600-800 MG-UNIT TABS Take 1 tablet by mouth 2 (two) times daily.     . capecitabine (XELODA) 500 MG tablet Take 3 tablets (1,500 mg total) by mouth 2 (two) times daily after a meal. Take for 14 days on, 7 days off, repeat every 21 days 84 tablet 3  . Cholecalciferol (VITAMIN D3) 2000 UNITS TABS Take 2,000 mg by mouth daily.    . ferrous sulfate 325 (65 FE) MG tablet Take 325 mg by mouth daily as needed (energy).     . fish oil-omega-3 fatty acids 1000 MG capsule Take 1 g by mouth 2 (two) times daily.     . furosemide (LASIX) 20 MG tablet Take 20 mg by mouth daily as needed for fluid or edema. Reported on 05/07/2015    . gabapentin (NEURONTIN) 100 MG capsule Take 100 mg by mouth 2 (two) times daily.    Marland Kitchen ibuprofen (ADVIL,MOTRIN) 200 MG tablet Take 400 mg by mouth every 6 (six) hours  as needed for headache (pain). Reported on 11/12/2015    . Ipratropium-Albuterol (COMBIVENT RESPIMAT) 20-100 MCG/ACT AERS respimat Inhale 1 puff into the lungs every 6 (six) hours. (Patient taking differently: Inhale 1 puff into the lungs every 6 (six) hours as needed for wheezing or shortness of breath. ) 4 g 6  . lidocaine-prilocaine (EMLA) cream Apply to affected area once (Patient taking  differently: Apply 1 application topically daily as needed (pain). Apply to affected area once) 30 g 3  . Magnesium Oxide (MAG-OXIDE) 200 MG TABS Take 1 tablet (200 mg total) by mouth daily. (Patient taking differently: Take 200 mg by mouth at bedtime. ) 30 tablet 0  . metFORMIN (GLUCOPHAGE) 500 MG tablet Take 2 tablets (1,000 mg total) by mouth 2 (two) times daily with a meal for 30 days. 120 tablet 0  . metoprolol succinate (TOPROL-XL) 25 MG 24 hr tablet Take 1 tablet (25 mg total) by mouth daily. 90 tablet 3  . naproxen sodium (ALEVE) 220 MG tablet Take 220 mg by mouth daily as needed (pain).    . ondansetron (ZOFRAN) 8 MG tablet Take 1 tablet (8 mg total) by mouth 2 (two) times daily as needed (Nausea or vomiting). 30 tablet 1  . OXYGEN Inhale 4 L into the lungs continuous.     . prochlorperazine (COMPAZINE) 10 MG tablet Take 1 tablet (10 mg total) by mouth every 6 (six) hours as needed (Nausea or vomiting). 30 tablet 1  . rivaroxaban (XARELTO) 20 MG TABS tablet Take 1 tablet (20 mg total) by mouth daily with supper. 90 tablet 2  . simvastatin (ZOCOR) 40 MG tablet Take 40 mg by mouth at bedtime.      . sodium chloride (OCEAN) 0.65 % SOLN nasal spray Place 1 spray into both nostrils as needed for congestion.      Allergies as of 12/28/2018 - Review Complete 12/28/2018  Allergen Reaction Noted  . Prednisolone Shortness Of Breath and Other (See Comments) 06/06/2018  . Prednisone Anaphylaxis 06/01/2018  . Sulfa antibiotics Swelling 05/18/2011    Family History  Problem Relation Age of Onset  . Stroke Mother   . Cancer Father        'voice box'  . Colon cancer Sister        dx >50  . Heart attack Brother   . Skin cancer Brother 31  . Cancer Sister        primary site unk, dx >50  . Diabetes Brother   . Stroke Brother   . Stroke Sister   . ALS Sister   . Colon cancer Sister 61  . Dementia Sister   . Breast cancer Sister        dx >5-, met to brain    Social History    Socioeconomic History  . Marital status: Married    Spouse name: Not on file  . Number of children: Not on file  . Years of education: Not on file  . Highest education level: Not on file  Occupational History  . Not on file  Social Needs  . Financial resource strain: Not on file  . Food insecurity    Worry: Not on file    Inability: Not on file  . Transportation needs    Medical: Not on file    Non-medical: Not on file  Tobacco Use  . Smoking status: Never Smoker  . Smokeless tobacco: Never Used  Substance and Sexual Activity  . Alcohol use: No  . Drug use:  No  . Sexual activity: Yes  Lifestyle  . Physical activity    Days per week: Not on file    Minutes per session: Not on file  . Stress: Not on file  Relationships  . Social Herbalist on phone: Not on file    Gets together: Not on file    Attends religious service: Not on file    Active member of club or organization: Not on file    Attends meetings of clubs or organizations: Not on file    Relationship status: Not on file  . Intimate partner violence    Fear of current or ex partner: Not on file    Emotionally abused: Not on file    Physically abused: Not on file    Forced sexual activity: Not on file  Other Topics Concern  . Not on file  Social History Narrative  . Not on file    Review of Systems: Severe dyspnea associated with anemia as noted in HPI, improved after transfusion.  Amazingly, no chest pain. No leg swelling, arthritis, or skin rashes.  Physical Exam: Vital signs in last 24 hours: Temp:  [97.5 F (36.4 C)-99.2 F (37.3 C)] 98.1 F (36.7 C) (08/15 1229) Pulse Rate:  [25-112] 96 (08/15 1445) Resp:  [15-35] 28 (08/15 1445) BP: (84-141)/(41-112) 127/45 (08/15 1445) SpO2:  [91 %-100 %] 98 % (08/15 1445) FiO2 (%):  [4 %] 4 % (08/14 1914) Weight:  [81.6 kg] 81.6 kg (08/14 1846)   General:   Alert,  Well-developed, well-nourished, pleasant and cooperative, appearing somewhat weak  but in NAD. Head:  Normocephalic and atraumatic. Eyes:  Sclera clear, no icterus.    Lungs:  Clear throughout to auscultation.   No wheezes, crackles, or rhonchi. No evident respiratory distress. Heart:   Regular rate and rhythm (looks like normal sinus rhythm at present, on the bedside monitor); no murmurs, clicks, rubs,  or gallops. Abdomen:  Soft, nontender, nontympanitic, and nondistended. No masses, hepatosplenomegaly or ventral hernias noted.  Rectal: Generous amount of mushy, black, bloody smelling, maroon-tinged stool sent to the lab for Hemoccult verification. Msk:   Symmetrical without gross deformities. Pulses:  Normal radial pulse is noted. Extremities:   Without clubbing, cyanosis, or edema. Neurologic:  Alert and coherent;  grossly normal neurologically. Skin: Multiple ecchymoses on her arms Psych:   Alert and cooperative. Normal mood and affect.  Intake/Output from previous day: 08/14 0701 - 08/15 0700 In: 1106.7 [I.V.:791.7; Blood:315] Out: -  Intake/Output this shift: Total I/O In: 1929.8 [I.V.:815; Blood:914.8; IV Piggyback:200] Out: -   Lab Results: Recent Labs    12/28/18 1935 12/29/18 0725 12/29/18 1352  WBC 7.6 7.6 7.1  HGB 2.6* 6.5* 9.5*  HCT 9.1* 19.2* 28.1*  PLT 185 159 138*   BMET Recent Labs    12/28/18 1826 12/29/18 1353  NA 141 144  K 4.2 3.0*  CL 110 109  CO2 15* 23  GLUCOSE 259* 204*  BUN 31* 29*  CREATININE 1.07* 1.08*  CALCIUM 6.5* 6.6*   LFT Recent Labs    12/29/18 1353  PROT 4.5*  ALBUMIN 2.5*  AST 39  ALT 14  ALKPHOS 59  BILITOT 0.9   PT/INR Recent Labs    12/28/18 2140  LABPROT 31.3*  INR 3.1*    Studies/Results: Dg Chest Port 1 View  Result Date: 12/29/2018 CLINICAL DATA:  Acute respiratory distress. Recent transfusion. EXAM: PORTABLE CHEST 1 VIEW COMPARISON:  12/28/2018 FINDINGS: Cardiomegaly remains stable. Small to  moderate left pleural effusion and left retrocardiac atelectasis or infiltrate show no  significant change. Probable tiny right pleural effusion. Surgical clips again seen in both axillary regions. IMPRESSION: 1. No significant change in small to moderate left pleural effusion and left retrocardiac atelectasis versus infiltrate. 2. Stable cardiomegaly. Probable tiny right pleural effusion. Electronically Signed   By: Marlaine Hind M.D.   On: 12/29/2018 05:27   Dg Chest Portable 1 View  Result Date: 12/28/2018 CLINICAL DATA:  Nausea, vomiting and intermittent shortness of breath. EXAM: PORTABLE CHEST 1 VIEW COMPARISON:  11/27/2018. FINDINGS: The cardiac silhouette remains mildly enlarged. Small to moderate-sized left pleural effusion, increased. Increased left basilar airspace opacity. Minimal right basilar atelectasis with improvement. Stable diffuse peribronchial thickening and accentuation of the interstitial markings. Bilateral postmastectomy changes with bilateral axillary surgical clips. IMPRESSION: 1. Small to moderate-sized left pleural effusion, increased. 2. Increased left basilar atelectasis or pneumonia. 3. Minimal right basilar atelectasis with improvement. 4. Stable mild cardiomegaly and chronic bronchitic changes. Electronically Signed   By: Claudie Revering M.D.   On: 12/28/2018 21:53    Impression: 1.  Subacute GI bleeding, characterized by 7 g drop in hemoglobin over the past 6 weeks, and mild elevation of BUN compared to baseline. 2.  Profound posthemorrhagic anemia, corrected following transfusion 3.  Chronic anticoagulation, with exposure to nonsteroidal anti-inflammatory drugs, without PPI prophylaxis 4.  History of colonic diverticulosis by previous colonoscopy 5.  History of metastatic breast cancer, remote history of endometrial and ovarian cancer, family history of colon cancer in her sister  Plan: I recommended endoscopic evaluation tomorrow (has not been n.p.o. today).  I have reviewed the nature, purpose, risks, and benefits of endoscopic evaluation as compared to  the alternative of observation with empiric antipeptic therapy.  She is agreeable to proceed endoscopic evaluation.  The patient is not showing signs of active, acute, destabilizing bleeding at this time, but clearly has had substantial blood loss in recent weeks.  I assume that this is related to nonsteroidal gastropathy, with bleeding accentuated by her chronic anticoagulation.  However, lesions elsewhere in the GI tract (for example, colonic ulcerations from nonsteroidal anti-inflammatory drugs) are also possible.  Furthermore, patients on anticoagulation will sometimes not have any identifiable, discrete "point source" of the blood loss.  Therefore, if her upper endoscopy is unrevealing, we will have to decide if it is worth putting this patient through an updated colonoscopy.   LOS: 1 day   [place]  12/29/2018, 2:49 PM   Pager [phone] If no answer or after 5 PM call [phone]

## 2018-12-29 NOTE — Consult Note (Signed)
Referring Provider: Dr. Bettina Gavia Primary Care Physician:  [name], MD Primary Gastroenterologist:  Dr. Penelope Coop  Reason for Consultation: Profound anemia with melenic stool  HPI: Brittany Porter is a 73 y.o. female being admitted through the emergency room today because of severe dyspnea, associated with a roughly 1 month history of dark stools, and associated with a hemoglobin of 2.6 as compared to 10.0 when checked 6 weeks earlier.  Her stool is dark, almost black, maroon-tinged, and confirmed to be Hemoccult positive.  She has been transfused to a hemoglobin of 9.5 and is feeling substantially better.  Her BUN of 31 reflects a moderate rise from baseline of 20.  She has been on Xarelto for the past several months because of atrial fibrillation, and also has been using naproxen on a regular basis because of pain she has a history of metastatic breast cancer, as well as remote history of ovarian and endometrial cancer.    She has a sister with colon cancer; the patient underwent screening colonoscopy on 2 occasions by Dr. Acquanetta Sit, in 2011 and more recently in September 2016, with both exams negative except for diverticulosis.  She does not have any significant active upper or lower GI tract symptoms, apart from the frequent dark, loose stools.   Past Medical History:  Diagnosis Date  . Arthritis   . Breast cancer ([place])   . Diabetes mellitus   . Endometrial carcinoma ([place]) 10/2008  . Family history of breast cancer   . Family history of colon cancer   . Family history of skin cancer   . Fluid overload   . History of radiation therapy 04/13/2009, 04/16/2009, 04/27/2009, 05/07/2009, 05/18/2009   3000 cGy to proximal vagina  . Hyperlipidemia   . Iron deficiency   . Melanoma ([place]) 2010   rt arm and back  . Ovarian cancer ([place])   . Pleural effusion 04/2010    Past Surgical History:  Procedure Laterality Date  . ABDOMINAL HYSTERECTOMY  10/2008  . APPENDECTOMY    . INSERTION OF MESH   05/28/2012   Procedure: INSERTION OF MESH;  Surgeon: Adin Hector, MD;  Location: [facility];  Service: General;  Laterality: N/A;  . KNEE ARTHROSCOPY  04/2010   Right knee  . LAPAROSCOPIC LYSIS OF ADHESIONS  05/28/2012   Procedure: LAPAROSCOPIC LYSIS OF ADHESIONS;  Surgeon: Adin Hector, MD;  Location: [facility];  Service: General;  Laterality: N/A;  . MASTECTOMY  1997   Bilateral with lymph nodes  . Melanoma removal  02/2009, 07/2009  . VENTRAL HERNIA REPAIR  05/28/2012   Procedure: LAPAROSCOPIC VENTRAL HERNIA;  Surgeon: Adin Hector, MD;  Location: [facility];  Service: General;  Laterality: N/A;    Prior to Admission medications   Medication Sig Start Date End Date Taking? Authorizing Provider  amiodarone (PACERONE) 200 MG tablet START BY TAKING 1 TABLET BY MOUTH TWICE DAILY, THEN REDUCE TO 1 TABLET BY MOUTH ONCE DAILY Patient taking differently: Take 200 mg by mouth daily.  11/28/18  Yes Elouise Munroe, MD  Calcium Carb-Cholecalciferol (CALCIUM 600+D3) 600-800 MG-UNIT TABS Take 1 tablet by mouth 2 (two) times daily.    Yes [provider]  capecitabine (XELODA) 500 MG tablet Take 3 tablets (1,500 mg total) by mouth 2 (two) times daily after a meal. Take for 14 days on, 7 days off, repeat every 21 days 12/07/18  Yes Nicholas Lose, MD  Cholecalciferol (VITAMIN D3) 2000 UNITS TABS Take 2,000 mg by mouth daily.  Yes [provider]  ferrous sulfate 325 (65 FE) MG tablet Take 325 mg by mouth daily as needed (energy).    Yes [provider]  fish oil-omega-3 fatty acids 1000 MG capsule Take 1 g by mouth 2 (two) times daily.    Yes [provider]  furosemide (LASIX) 20 MG tablet Take 20 mg by mouth daily as needed for fluid or edema. Reported on 05/07/2015 10/08/14  Yes [provider]  gabapentin (NEURONTIN) 100 MG capsule Take 100 mg by mouth 2 (two) times daily. 07/27/18  Yes [provider]  ibuprofen (ADVIL,MOTRIN) 200 MG tablet Take 400 mg  by mouth every 6 (six) hours as needed for headache (pain). Reported on 11/12/2015   Yes [provider]  Ipratropium-Albuterol (COMBIVENT RESPIMAT) 20-100 MCG/ACT AERS respimat Inhale 1 puff into the lungs every 6 (six) hours. Patient taking differently: Inhale 1 puff into the lungs every 6 (six) hours as needed for wheezing or shortness of breath.  11/27/18  Yes Margaretha Seeds, MD  lidocaine-prilocaine (EMLA) cream Apply to affected area once Patient taking differently: Apply 1 application topically daily as needed (pain). Apply to affected area once 09/24/18  Yes Nicholas Lose, MD  Magnesium Oxide (MAG-OXIDE) 200 MG TABS Take 1 tablet (200 mg total) by mouth daily. Patient taking differently: Take 200 mg by mouth at bedtime.  06/06/18  Yes Isla Pence, MD  metFORMIN (GLUCOPHAGE) 500 MG tablet Take 2 tablets (1,000 mg total) by mouth 2 (two) times daily with a meal for 30 days. 08/24/18 12/28/18 Yes Long, Wonda Olds, MD  metoprolol succinate (TOPROL-XL) 25 MG 24 hr tablet Take 1 tablet (25 mg total) by mouth daily. 10/15/18  Yes Elouise Munroe, MD  naproxen sodium (ALEVE) 220 MG tablet Take 220 mg by mouth daily as needed (pain).   Yes [provider]  ondansetron (ZOFRAN) 8 MG tablet Take 1 tablet (8 mg total) by mouth 2 (two) times daily as needed (Nausea or vomiting). 09/24/18  Yes Nicholas Lose, MD  OXYGEN Inhale 4 L into the lungs continuous.    Yes [provider]  prochlorperazine (COMPAZINE) 10 MG tablet Take 1 tablet (10 mg total) by mouth every 6 (six) hours as needed (Nausea or vomiting). 09/24/18  Yes Nicholas Lose, MD  rivaroxaban (XARELTO) 20 MG TABS tablet Take 1 tablet (20 mg total) by mouth daily with supper. 10/15/18  Yes Elouise Munroe, MD  simvastatin (ZOCOR) 40 MG tablet Take 40 mg by mouth at bedtime.     Yes [provider]  sodium chloride (OCEAN) 0.65 % SOLN nasal spray Place 1 spray into both nostrils as needed for congestion.   Yes  [provider]    Current Facility-Administered Medications  Medication Dose Route Frequency Provider Last Rate Last Dose  . 0.9 %  sodium chloride infusion  10 mL/hr Intravenous Once Albrizze, Kaitlyn E, PA-C      . 0.9 %  sodium chloride infusion  250 mL Intravenous PRN Gala Romney L, MD      . amiodarone (PACERONE) tablet 200 mg  200 mg Oral Daily Wendee Beavers T, MD   200 mg at 12/29/18 0956  . atorvastatin (LIPITOR) tablet 20 mg  20 mg Oral q1800 Gonfa, Taye T, MD      . calcium-vitamin D (OSCAL WITH D) 500-200 MG-UNIT per tablet 1 tablet  1 tablet Oral BID WC Elwyn Reach, MD   1 tablet at 12/29/18 1328  . cefTRIAXone (ROCEPHIN)  2 g in sodium chloride 0.9 % 100 mL IVPB  2 g Intravenous Q24H Mercy Riding, MD   Stopped at 12/29/18 1148  . cholecalciferol (VITAMIN D3) tablet 2,000 Units  2,000 Units Oral Daily Elwyn Reach, MD   2,000 Units at 12/29/18 0956  . ferrous sulfate tablet 325 mg  325 mg Oral Daily PRN Gala Romney L, MD      . gabapentin (NEURONTIN) capsule 100 mg  100 mg Oral BID Gala Romney L, MD   100 mg at 12/29/18 0956  . insulin aspart (novoLOG) injection 0-5 Units  0-5 Units Subcutaneous QHS Garba, Mohammad L, MD      . insulin aspart (novoLOG) injection 0-9 Units  0-9 Units Subcutaneous TID WC Elwyn Reach, MD   3 Units at 12/29/18 1228  . ipratropium-albuterol (DUONEB) 0.5-2.5 (3) MG/3ML nebulizer solution 3 mL  3 mL Inhalation Q6H Garba, Mohammad L, MD   3 mL at 12/29/18 1329  . magnesium oxide (MAG-OX) tablet 200 mg  200 mg Oral QHS Garba, Mohammad L, MD      . metoprolol succinate (TOPROL-XL) 24 hr tablet 25 mg  25 mg Oral Daily Elwyn Reach, MD   25 mg at 12/29/18 0956  . omega-3 acid ethyl esters (LOVAZA) capsule 1 g  1 g Oral BID Gala Romney L, MD   1 g at 12/29/18 1329  . ondansetron (ZOFRAN) tablet 8 mg  8 mg Oral BID PRN Gala Romney L, MD      . pantoprazole (PROTONIX) injection 40 mg  40 mg Intravenous Q12H Wendee Beavers T, MD   40 mg at 12/29/18 1223  . prochlorperazine (COMPAZINE) tablet 10 mg  10 mg Oral Q6H PRN Gala Romney L, MD      . sodium chloride (OCEAN) 0.65 % nasal spray 1 spray  1 spray Each Nare PRN Jonelle Sidle, Mohammad L, MD      . sodium chloride flush (NS) 0.9 % injection 3 mL  3 mL Intravenous Q12H Garba, Mohammad L, MD      . sodium chloride flush (NS) 0.9 % injection 3 mL  3 mL Intravenous PRN Elwyn Reach, MD       Current Outpatient Medications  Medication Sig Dispense Refill  . amiodarone (PACERONE) 200 MG tablet START BY TAKING 1 TABLET BY MOUTH TWICE DAILY, THEN REDUCE TO 1 TABLET BY MOUTH ONCE DAILY (Patient taking differently: Take 200 mg by mouth daily. ) 180 tablet 0  . Calcium Carb-Cholecalciferol (CALCIUM 600+D3) 600-800 MG-UNIT TABS Take 1 tablet by mouth 2 (two) times daily.     . capecitabine (XELODA) 500 MG tablet Take 3 tablets (1,500 mg total) by mouth 2 (two) times daily after a meal. Take for 14 days on, 7 days off, repeat every 21 days 84 tablet 3  . Cholecalciferol (VITAMIN D3) 2000 UNITS TABS Take 2,000 mg by mouth daily.    . ferrous sulfate 325 (65 FE) MG tablet Take 325 mg by mouth daily as needed (energy).     . fish oil-omega-3 fatty acids 1000 MG capsule Take 1 g by mouth 2 (two) times daily.     . furosemide (LASIX) 20 MG tablet Take 20 mg by mouth daily as needed for fluid or edema. Reported on 05/07/2015    . gabapentin (NEURONTIN) 100 MG capsule Take 100 mg by mouth 2 (two) times daily.    Marland Kitchen ibuprofen (ADVIL,MOTRIN) 200 MG tablet Take 400 mg by mouth every 6 (six) hours  as needed for headache (pain). Reported on 11/12/2015    . Ipratropium-Albuterol (COMBIVENT RESPIMAT) 20-100 MCG/ACT AERS respimat Inhale 1 puff into the lungs every 6 (six) hours. (Patient taking differently: Inhale 1 puff into the lungs every 6 (six) hours as needed for wheezing or shortness of breath. ) 4 g 6  . lidocaine-prilocaine (EMLA) cream Apply to affected area once (Patient taking  differently: Apply 1 application topically daily as needed (pain). Apply to affected area once) 30 g 3  . Magnesium Oxide (MAG-OXIDE) 200 MG TABS Take 1 tablet (200 mg total) by mouth daily. (Patient taking differently: Take 200 mg by mouth at bedtime. ) 30 tablet 0  . metFORMIN (GLUCOPHAGE) 500 MG tablet Take 2 tablets (1,000 mg total) by mouth 2 (two) times daily with a meal for 30 days. 120 tablet 0  . metoprolol succinate (TOPROL-XL) 25 MG 24 hr tablet Take 1 tablet (25 mg total) by mouth daily. 90 tablet 3  . naproxen sodium (ALEVE) 220 MG tablet Take 220 mg by mouth daily as needed (pain).    . ondansetron (ZOFRAN) 8 MG tablet Take 1 tablet (8 mg total) by mouth 2 (two) times daily as needed (Nausea or vomiting). 30 tablet 1  . OXYGEN Inhale 4 L into the lungs continuous.     . prochlorperazine (COMPAZINE) 10 MG tablet Take 1 tablet (10 mg total) by mouth every 6 (six) hours as needed (Nausea or vomiting). 30 tablet 1  . rivaroxaban (XARELTO) 20 MG TABS tablet Take 1 tablet (20 mg total) by mouth daily with supper. 90 tablet 2  . simvastatin (ZOCOR) 40 MG tablet Take 40 mg by mouth at bedtime.      . sodium chloride (OCEAN) 0.65 % SOLN nasal spray Place 1 spray into both nostrils as needed for congestion.      Allergies as of 12/28/2018 - Review Complete 12/28/2018  Allergen Reaction Noted  . Prednisolone Shortness Of Breath and Other (See Comments) 06/06/2018  . Prednisone Anaphylaxis 06/01/2018  . Sulfa antibiotics Swelling 05/18/2011    Family History  Problem Relation Age of Onset  . Stroke Mother   . Cancer Father        'voice box'  . Colon cancer Sister        dx >50  . Heart attack Brother   . Skin cancer Brother 31  . Cancer Sister        primary site unk, dx >50  . Diabetes Brother   . Stroke Brother   . Stroke Sister   . ALS Sister   . Colon cancer Sister 61  . Dementia Sister   . Breast cancer Sister        dx >5-, met to brain    Social History    Socioeconomic History  . Marital status: Married    Spouse name: Not on file  . Number of children: Not on file  . Years of education: Not on file  . Highest education level: Not on file  Occupational History  . Not on file  Social Needs  . Financial resource strain: Not on file  . Food insecurity    Worry: Not on file    Inability: Not on file  . Transportation needs    Medical: Not on file    Non-medical: Not on file  Tobacco Use  . Smoking status: Never Smoker  . Smokeless tobacco: Never Used  Substance and Sexual Activity  . Alcohol use: No  . Drug use:  No  . Sexual activity: Yes  Lifestyle  . Physical activity    Days per week: Not on file    Minutes per session: Not on file  . Stress: Not on file  Relationships  . Social Herbalist on phone: Not on file    Gets together: Not on file    Attends religious service: Not on file    Active member of club or organization: Not on file    Attends meetings of clubs or organizations: Not on file    Relationship status: Not on file  . Intimate partner violence    Fear of current or ex partner: Not on file    Emotionally abused: Not on file    Physically abused: Not on file    Forced sexual activity: Not on file  Other Topics Concern  . Not on file  Social History Narrative  . Not on file    Review of Systems: Severe dyspnea associated with anemia as noted in HPI, improved after transfusion.  Amazingly, no chest pain. No leg swelling, arthritis, or skin rashes.  Physical Exam: Vital signs in last 24 hours: Temp:  [97.5 F (36.4 C)-99.2 F (37.3 C)] 98.1 F (36.7 C) (08/15 1229) Pulse Rate:  [25-112] 96 (08/15 1445) Resp:  [15-35] 28 (08/15 1445) BP: (84-141)/(41-112) 127/45 (08/15 1445) SpO2:  [91 %-100 %] 98 % (08/15 1445) FiO2 (%):  [4 %] 4 % (08/14 1914) Weight:  [81.6 kg] 81.6 kg (08/14 1846)   General:   Alert,  Well-developed, well-nourished, pleasant and cooperative, appearing somewhat weak  but in NAD. Head:  Normocephalic and atraumatic. Eyes:  Sclera clear, no icterus.    Lungs:  Clear throughout to auscultation.   No wheezes, crackles, or rhonchi. No evident respiratory distress. Heart:   Regular rate and rhythm (looks like normal sinus rhythm at present, on the bedside monitor); no murmurs, clicks, rubs,  or gallops. Abdomen:  Soft, nontender, nontympanitic, and nondistended. No masses, hepatosplenomegaly or ventral hernias noted.  Rectal: Generous amount of mushy, black, bloody smelling, maroon-tinged stool sent to the lab for Hemoccult verification. Msk:   Symmetrical without gross deformities. Pulses:  Normal radial pulse is noted. Extremities:   Without clubbing, cyanosis, or edema. Neurologic:  Alert and coherent;  grossly normal neurologically. Skin: Multiple ecchymoses on her arms Psych:   Alert and cooperative. Normal mood and affect.  Intake/Output from previous day: 08/14 0701 - 08/15 0700 In: 1106.7 [I.V.:791.7; Blood:315] Out: -  Intake/Output this shift: Total I/O In: 1929.8 [I.V.:815; Blood:914.8; IV Piggyback:200] Out: -   Lab Results: Recent Labs    12/28/18 1935 12/29/18 0725 12/29/18 1352  WBC 7.6 7.6 7.1  HGB 2.6* 6.5* 9.5*  HCT 9.1* 19.2* 28.1*  PLT 185 159 138*   BMET Recent Labs    12/28/18 1826 12/29/18 1353  NA 141 144  K 4.2 3.0*  CL 110 109  CO2 15* 23  GLUCOSE 259* 204*  BUN 31* 29*  CREATININE 1.07* 1.08*  CALCIUM 6.5* 6.6*   LFT Recent Labs    12/29/18 1353  PROT 4.5*  ALBUMIN 2.5*  AST 39  ALT 14  ALKPHOS 59  BILITOT 0.9   PT/INR Recent Labs    12/28/18 2140  LABPROT 31.3*  INR 3.1*    Studies/Results: Dg Chest Port 1 View  Result Date: 12/29/2018 CLINICAL DATA:  Acute respiratory distress. Recent transfusion. EXAM: PORTABLE CHEST 1 VIEW COMPARISON:  12/28/2018 FINDINGS: Cardiomegaly remains stable. Small to  moderate left pleural effusion and left retrocardiac atelectasis or infiltrate show no  significant change. Probable tiny right pleural effusion. Surgical clips again seen in both axillary regions. IMPRESSION: 1. No significant change in small to moderate left pleural effusion and left retrocardiac atelectasis versus infiltrate. 2. Stable cardiomegaly. Probable tiny right pleural effusion. Electronically Signed   By: Marlaine Hind M.D.   On: 12/29/2018 05:27   Dg Chest Portable 1 View  Result Date: 12/28/2018 CLINICAL DATA:  Nausea, vomiting and intermittent shortness of breath. EXAM: PORTABLE CHEST 1 VIEW COMPARISON:  11/27/2018. FINDINGS: The cardiac silhouette remains mildly enlarged. Small to moderate-sized left pleural effusion, increased. Increased left basilar airspace opacity. Minimal right basilar atelectasis with improvement. Stable diffuse peribronchial thickening and accentuation of the interstitial markings. Bilateral postmastectomy changes with bilateral axillary surgical clips. IMPRESSION: 1. Small to moderate-sized left pleural effusion, increased. 2. Increased left basilar atelectasis or pneumonia. 3. Minimal right basilar atelectasis with improvement. 4. Stable mild cardiomegaly and chronic bronchitic changes. Electronically Signed   By: Claudie Revering M.D.   On: 12/28/2018 21:53    Impression: 1.  Subacute GI bleeding, characterized by 7 g drop in hemoglobin over the past 6 weeks, and mild elevation of BUN compared to baseline. 2.  Profound posthemorrhagic anemia, corrected following transfusion 3.  Chronic anticoagulation, with exposure to nonsteroidal anti-inflammatory drugs, without PPI prophylaxis 4.  History of colonic diverticulosis by previous colonoscopy 5.  History of metastatic breast cancer, remote history of endometrial and ovarian cancer, family history of colon cancer in her sister  Plan: I recommended endoscopic evaluation tomorrow (has not been n.p.o. today).  I have reviewed the nature, purpose, risks, and benefits of endoscopic evaluation as compared to  the alternative of observation with empiric antipeptic therapy.  She is agreeable to proceed endoscopic evaluation.  The patient is not showing signs of active, acute, destabilizing bleeding at this time, but clearly has had substantial blood loss in recent weeks.  I assume that this is related to nonsteroidal gastropathy, with bleeding accentuated by her chronic anticoagulation.  However, lesions elsewhere in the GI tract (for example, colonic ulcerations from nonsteroidal anti-inflammatory drugs) are also possible.  Furthermore, patients on anticoagulation will sometimes not have any identifiable, discrete "point source" of the blood loss.  Therefore, if her upper endoscopy is unrevealing, we will have to decide if it is worth putting this patient through an updated colonoscopy.   LOS: 1 day   [place]  12/29/2018, 2:49 PM   Pager [phone] If no answer or after 5 PM call [phone]

## 2018-12-29 NOTE — Significant Event (Addendum)
Alerted that patient has had acute worsening in SOB and cough over the past couple hours after being admitted with symptomatic anemia and HCAP.   Her Hgb was 2.6 and lactate was 7. She has received 2 units of RBCs, 2.5 liters NS bolus, and NS infusion.   She has hx of CHF, EF 45-50% and likely in acute CHF superimposed on her PNA.   Plan: Lasix 40 mg IV now, SLIV, repeat CXR.

## 2018-12-29 NOTE — ED Notes (Signed)
ED TO INPATIENT HANDOFF REPORT  ED Nurse Name and Phone #: [phone]  S Name/Age/Gender [name] 73 y.o. female Room/Bed: 031C/031C  Code Status   Code Status: Full Code  Home/SNF/Other Home Patient oriented to: self, place, time and situation Is this baseline? Yes   Triage Complete: Triage complete  Chief Complaint Unresponsive; AMS; Hypotensive  Triage Note Pt arrives via [facility] from home with AMS. Per EMS pt has been having N/V AND intermittent SOB that got better for a few days but then returned today with a general decline of mental status. Pt walks at baseline but has been been unable to do so. EMS VS 102/78, HR 104, CBG 329, TEMP 97.1 F, 97% on 4L (wears 4L at baseline).    Allergies Allergies  Allergen Reactions  . Prednisolone Shortness Of Breath and Other (See Comments)     increased BP  . Prednisone Anaphylaxis  . Sulfa Antibiotics Swelling    "Eyes swelled shut" - reaction to eye drops    Level of Care/Admitting Diagnosis ED Disposition    ED Disposition Condition [facility] Area: [place] [100100]  Level of Care: Progressive [102]  Covid Evaluation: Asymptomatic Screening Protocol (No Symptoms)  Diagnosis: Symptomatic anemia [0160109]  Admitting Physician: Elwyn Reach [[id number]]  Attending Physician: Elwyn Reach [[id number]]  Estimated length of stay: past midnight tomorrow  Certification:: I certify this patient will need inpatient services for at least 2 midnights  PT Class (Do Not Modify): Inpatient [101]  PT Acc Code (Do Not Modify): Private [1]       B Medical/Surgery History Past Medical History:  Diagnosis Date  . Arthritis   . Breast cancer ([place])   . Diabetes mellitus   . Endometrial carcinoma ([place]) 10/2008  . Family history of breast cancer   . Family history of colon cancer   . Family history of skin cancer   . Fluid overload   . History of radiation therapy 04/13/2009, 04/16/2009, 04/27/2009,  05/07/2009, 05/18/2009   3000 cGy to proximal vagina  . Hyperlipidemia   . Iron deficiency   . Melanoma ([place]) 2010   rt arm and back  . Ovarian cancer ([place])   . Pleural effusion 04/2010   Past Surgical History:  Procedure Laterality Date  . ABDOMINAL HYSTERECTOMY  10/2008  . APPENDECTOMY    . INSERTION OF MESH  05/28/2012   Procedure: INSERTION OF MESH;  Surgeon: Adin Hector, MD;  Location: [facility];  Service: General;  Laterality: N/A;  . KNEE ARTHROSCOPY  04/2010   Right knee  . LAPAROSCOPIC LYSIS OF ADHESIONS  05/28/2012   Procedure: LAPAROSCOPIC LYSIS OF ADHESIONS;  Surgeon: Adin Hector, MD;  Location: [facility];  Service: General;  Laterality: N/A;  . MASTECTOMY  1997   Bilateral with lymph nodes  . Melanoma removal  02/2009, 07/2009  . VENTRAL HERNIA REPAIR  05/28/2012   Procedure: LAPAROSCOPIC VENTRAL HERNIA;  Surgeon: Adin Hector, MD;  Location: [facility];  Service: General;  Laterality: N/A;     A IV Location/Drains/Wounds Patient Lines/Drains/Airways Status   Active Line/Drains/Airways    Name:   Placement date:   Placement time:   Site:   Days:   Peripheral IV 12/28/18 Left Antecubital   12/28/18    1858    Antecubital   1   Peripheral IV 12/28/18 Right;Anterior Forearm   12/28/18    2103    Forearm   1  Intake/Output Last 24 hours  Intake/Output Summary (Last 24 hours) at 12/29/2018 1453 Last data filed at 12/29/2018 1229 Gross per 24 hour  Intake 3036.42 ml  Output -  Net 3036.42 ml    Labs/Imaging Results for orders placed or performed during the hospital encounter of 12/28/18 (from the past 48 hour(s))  Comprehensive metabolic panel     Status: Abnormal   Collection Time: 12/28/18  6:26 PM  Result Value Ref Range   Sodium 141 135 - 145 mmol/L   Potassium 4.2 3.5 - 5.1 mmol/L   Chloride 110 98 - 111 mmol/L   CO2 15 (L) 22 - 32 mmol/L   Glucose, Bld 259 (H) 70 - 99 mg/dL   BUN 31 (H) 8 - 23 mg/dL   Creatinine, Ser 1.07 (H) 0.44 - 1.00 mg/dL    Calcium 6.5 (L) 8.9 - 10.3 mg/dL   Total Protein 4.0 (L) 6.5 - 8.1 g/dL   Albumin 2.2 (L) 3.5 - 5.0 g/dL   AST 35 15 - 41 U/L   ALT 11 0 - 44 U/L   Alkaline Phosphatase 52 38 - 126 U/L   Total Bilirubin 1.0 0.3 - 1.2 mg/dL   GFR calc non Af Amer 51 (L) >60 mL/min   GFR calc Af Amer 59 (L) >60 mL/min   Anion gap 16 (H) 5 - 15    Comment: Performed at [facility], [address] [place], [place] [address]  Lactic acid, plasma     Status: Abnormal   Collection Time: 12/28/18  6:26 PM  Result Value Ref Range   Lactic Acid, Venous 7.0 (HH) 0.5 - 1.9 mmol/L    Comment: CRITICAL RESULT CALLED TO, READ BACK BY AND VERIFIED WITH: Denzil Magnuson 12/28/2018 D [place] Performed at [facility] [address], [place], [place] [address]   CBC with Differential/Platelet     Status: Abnormal   Collection Time: 12/28/18  7:35 PM  Result Value Ref Range   WBC 7.6 4.0 - 10.5 K/uL   RBC <1.00 (L) 3.87 - 5.11 MIL/uL   Hemoglobin 2.6 (LL) 12.0 - 15.0 g/dL    Comment: REPEATED TO VERIFY RESULTS VERIFIED VIA RECOLLECT SPECIMEN CHECKED FOR CLOTS CRITICAL RESULT CALLED TO, READ BACK BY AND VERIFIED WITH: S TOWNS,RN 2010 12/28/2018 WBOND    HCT 9.1 (L) 36.0 - 46.0 %   MCV 104.6 (H) 80.0 - 100.0 fL   MCH 29.9 26.0 - 34.0 pg   MCHC 28.6 (L) 30.0 - 36.0 g/dL   RDW 30.5 (H) 11.5 - 15.5 %   Platelets 185 150 - 400 K/uL   nRBC 9 (H) 0 /100 WBC   Neutrophils Relative % 83 %   Lymphocytes Relative 15 %   Monocytes Relative 1 %   Eosinophils Relative 1 %   Basophils Relative 0 %   Band Neutrophils 0 %   Metamyelocytes Relative 0 %   Myelocytes 0 %   Promyelocytes Relative 0 %   Blasts 0 %   Other 0 %   Neutro Abs 6.3 1.7 - 7.7 K/uL   Lymphs Abs 1.1 0.7 - 4.0 K/uL   Monocytes Absolute 0.1 0.1 - 1.0 K/uL   Eosinophils Absolute 0.1 0.0 - 0.5 K/uL   Basophils Absolute 0.0 0.0 - 0.1 K/uL   RBC Morphology POLYCHROMASIA PRESENT     Comment: Performed at [facility], [address] [place], [place] [address]  Prepare RBC     Status:  None   Collection Time: 12/28/18  8:07 PM  Result Value Ref Range   Order Confirmation      ORDER PROCESSED BY BLOOD BANK Performed at [facility], [place] [address] [place], [place] [address]   Type and screen [place]     Status: None (Preliminary result)   Collection Time: 12/28/18  9:30 PM  Result Value Ref Range   ABO/RH(D) O POS    Antibody Screen NEG    Sample Expiration 12/31/2018,2359    Unit Number [id number]    Blood Component Type RED CELLS,LR    Unit division 00    Status of Unit ISSUED,FINAL    Transfusion Status OK TO TRANSFUSE    Crossmatch Result COMPATIBLE    Unit tag comment VERBAL ORDERS PER DR DELO    Donor AG Type NEGATIVE FOR [place]    Unit Number [id number]    Blood Component Type RBC LR PHER1    Unit division 00    Status of Unit ISSUED    Donor AG Type NEGATIVE FOR [place] ANTIGEN    Transfusion Status OK TO TRANSFUSE    Crossmatch Result COMPATIBLE    Unit Number [id number]    Blood Component Type RED CELLS,LR    Unit division 00    Status of Unit ISSUED    Donor AG Type NEGATIVE FOR [place] ANTIGEN    Transfusion Status OK TO TRANSFUSE    Crossmatch Result COMPATIBLE    Unit Number [id number]    Blood Component Type RBC LR PHER2    Unit division 00    Status of Unit ISSUED    Donor AG Type NEGATIVE FOR [place] ANTIGEN    Transfusion Status OK TO TRANSFUSE    Crossmatch Result COMPATIBLE   APTT     Status: None   Collection Time: 12/28/18  9:40 PM  Result Value Ref Range   aPTT 31 24 - 36 seconds    Comment: Performed at [facility], [place] [address] [place], [place] [address]  Protime-INR     Status: Abnormal   Collection Time: 12/28/18  9:40 PM  Result Value Ref Range   Prothrombin Time 31.3 (H) 11.4 - 15.2 seconds   INR 3.1 (H) 0.8 - 1.2    Comment: (NOTE) INR goal varies based on device and disease states. Performed at [facility], [place] [address], [place], [place] [address]   Urinalysis, Routine w reflex microscopic     Status: Abnormal   Collection Time: 12/28/18  9:48 PM  Result Value Ref Range   Color, Urine YELLOW YELLOW   APPearance CLEAR CLEAR   Specific Gravity, Urine 1.015 1.005 - 1.030   pH 5.0 5.0 - 8.0   Glucose, UA 50 (A) NEGATIVE mg/dL   Hgb urine dipstick NEGATIVE NEGATIVE   Bilirubin Urine NEGATIVE NEGATIVE   Ketones, ur 5 (A) NEGATIVE mg/dL   Protein, ur NEGATIVE NEGATIVE mg/dL   Nitrite NEGATIVE NEGATIVE   Leukocytes,Ua NEGATIVE NEGATIVE    Comment: Performed at [facility] [address], [place],  [address]  SARS Coronavirus 2 [facility] order, Performed in [facility] hospital lab) Nasopharyngeal Nasopharyngeal Swab     Status: None   Collection Time: 12/28/18  9:48 PM   Specimen: Nasopharyngeal Swab  Result Value Ref Range   SARS Coronavirus 2 NEGATIVE NEGATIVE    Comment: (NOTE) If result is NEGATIVE SARS-CoV-2 target nucleic acids are NOT DETECTED. The SARS-CoV-2 RNA  is generally detectable in upper and lower  respiratory specimens during the acute phase of infection. The lowest  concentration of SARS-CoV-2 viral copies this assay can detect is 250  copies / mL. A negative result does not preclude SARS-CoV-2 infection  and should not be used as the sole basis for treatment or other  patient management decisions.  A negative result may occur with  improper specimen collection / handling, submission of specimen other  than nasopharyngeal swab, presence of viral mutation(s) within the  areas targeted by this assay, and inadequate number of viral copies  (<250 copies / mL). A negative result must be combined with clinical  observations, patient history, and epidemiological information. If result is POSITIVE SARS-CoV-2 target nucleic acids are DETECTED. The SARS-CoV-2 RNA is generally detectable in upper and lower  respiratory specimens dur ing the acute phase of  infection.  Positive  results are indicative of active infection with SARS-CoV-2.  Clinical  correlation with patient history and other diagnostic information is  necessary to determine patient infection status.  Positive results do  not rule out bacterial infection or co-infection with other viruses. If result is PRESUMPTIVE POSTIVE SARS-CoV-2 nucleic acids MAY BE PRESENT.   A presumptive positive result was obtained on the submitted specimen  and confirmed on repeat testing.  While 2019 novel coronavirus  (SARS-CoV-2) nucleic acids may be present in the submitted sample  additional confirmatory testing may be necessary for epidemiological  and / or clinical management purposes  to differentiate between  SARS-CoV-2 and other Sarbecovirus currently known to infect humans.  If clinically indicated additional testing with an alternate test  methodology [phone]) is advised. The SARS-CoV-2 RNA is generally  detectable in upper and lower respiratory sp ecimens during the acute  phase of infection. The expected result is Negative. Fact Sheet for Patients:  StrictlyIdeas.no Fact Sheet for Healthcare Providers: BankingDealers.co.za This test is not yet approved or cleared by the [place] FDA and has been authorized for detection and/or diagnosis of SARS-CoV-2 by FDA under an Emergency Use Authorization (EUA).  This EUA will remain in effect (meaning this test can be used) for the duration of the COVID-19 declaration under Section 564(b)(1) of the Act, 21 U.S.C. section 360bbb-3(b)(1), unless the authorization is terminated or revoked sooner. Performed at [facility], [place] [address], [place], [place] [address]   Prepare RBC     Status: None   Collection Time: 12/29/18  7:13 AM  Result Value Ref Range   Order Confirmation      ORDER PROCESSED BY BLOOD BANK 2 units already available. Performed at [facility], [place] [address], [place], [place] [address]   Lactic acid, plasma     Status: Abnormal   Collection Time: 12/29/18  7:25 AM  Result Value Ref Range   Lactic Acid, Venous 2.0 (HH) 0.5 - 1.9 mmol/L    Comment: CRITICAL RESULT CALLED TO, READ BACK BY AND VERIFIED WITH: B.Teddie Curd,RN @ 0805 12/29/2018 [place] Performed at [facility], [place] [address] [place], [place] [address]   CBC with Differential/Platelet     Status: Abnormal   Collection Time: 12/29/18  7:25 AM  Result Value Ref Range   WBC 7.6 4.0 - 10.5 K/uL   RBC 2.12 (L) 3.87 - 5.11 MIL/uL   Hemoglobin 6.5 (LL) 12.0 - 15.0 g/dL    Comment: REPEATED TO VERIFY THIS CRITICAL RESULT HAS VERIFIED AND BEEN CALLED TO BRE Argentina Kosch, RN BY KAY WOOLLEN ON  08 15 2020 AT 0750, AND HAS BEEN READ BACK.     HCT 19.2 (L) 36.0 - 46.0 %   MCV 90.6 80.0 - 100.0 fL    Comment: REPEATED TO VERIFY   MCH 30.7 26.0 - 34.0 pg   MCHC 33.9 30.0 - 36.0 g/dL   RDW 17.4 (H) 11.5 - 15.5 %   Platelets 159 150 - 400 K/uL   nRBC 6.6 (H) 0.0 - 0.2 %   Neutrophils Relative % 83 %   Neutro Abs 6.4 1.7 - 7.7 K/uL   Band Neutrophils 1 %   Lymphocytes Relative 14 %   Lymphs Abs 1.1 0.7 - 4.0 K/uL   Monocytes Relative 2 %   Monocytes Absolute 0.2 0.1 - 1.0 K/uL   Eosinophils Relative 0 %   Eosinophils Absolute 0.0 0.0 - 0.5 K/uL   Basophils Relative 0 %   Basophils Absolute 0.0 0.0 - 0.1 K/uL   nRBC 6 (H) 0 /100 WBC   Abs Immature Granulocytes 0.00 0.00 - 0.07 K/uL   Polychromasia PRESENT     Comment: Performed at [facility], [address] [place], [place] [address]  POC CBG, ED     Status: Abnormal   Collection Time: 12/29/18  8:14 AM  Result Value Ref Range   Glucose-Capillary 284 (H) 70 - 99 mg/dL  CBG monitoring, ED     Status: Abnormal   Collection Time: 12/29/18 12:22 PM  Result Value Ref Range   Glucose-Capillary 229 (H) 70 - 99 mg/dL  CBC WITH DIFFERENTIAL     Status: Abnormal   Collection Time: 12/29/18  1:52 PM  Result Value Ref Range   WBC 7.1 4.0  - 10.5 K/uL   RBC 3.16 (L) 3.87 - 5.11 MIL/uL   Hemoglobin 9.5 (L) 12.0 - 15.0 g/dL    Comment: REPEATED TO VERIFY POST TRANSFUSION SPECIMEN    HCT 28.1 (L) 36.0 - 46.0 %   MCV 88.9 80.0 - 100.0 fL   MCH 30.1 26.0 - 34.0 pg   MCHC 33.8 30.0 - 36.0 g/dL   RDW 16.0 (H) 11.5 - 15.5 %   Platelets 138 (L) 150 - 400 K/uL   nRBC 6.8 (H) 0.0 - 0.2 %   Neutrophils Relative % 90 %   Neutro Abs 6.4 1.7 - 7.7 K/uL   Lymphocytes Relative 8 %   Lymphs Abs 0.6 (L) 0.7 - 4.0 K/uL   Monocytes Relative 2 %   Monocytes Absolute 0.1 0.1 - 1.0 K/uL   Eosinophils Relative 0 %   Eosinophils Absolute 0.0 0.0 - 0.5 K/uL   Basophils Relative 0 %   Basophils Absolute 0.0 0.0 - 0.1 K/uL   nRBC 11 (H) 0 /100 WBC   Abs Immature Granulocytes 0.00 0.00 - 0.07 K/uL   Polychromasia PRESENT     Comment: Performed at [facility], [address] [place], [place] [address]  Comprehensive metabolic panel     Status: Abnormal   Collection Time: 12/29/18  1:53 PM  Result Value Ref Range   Sodium 144 135 - 145 mmol/L   Potassium 3.0 (L) 3.5 - 5.1 mmol/L    Comment: DELTA CHECK NOTED   Chloride 109 98 - 111 mmol/L   CO2 23 22 - 32 mmol/L   Glucose, Bld 204 (H) 70 - 99 mg/dL   BUN 29 (H) 8 - 23 mg/dL   Creatinine, Ser 1.08 (H) 0.44 - 1.00 mg/dL   Calcium 6.6 (L) 8.9 -  10.3 mg/dL   Total Protein 4.5 (L) 6.5 - 8.1 g/dL   Albumin 2.5 (L) 3.5 - 5.0 g/dL   AST 39 15 - 41 U/L   ALT 14 0 - 44 U/L   Alkaline Phosphatase 59 38 - 126 U/L   Total Bilirubin 0.9 0.3 - 1.2 mg/dL   GFR calc non Af Amer 50 (L) >60 mL/min   GFR calc Af Amer 58 (L) >60 mL/min   Anion gap 12 5 - 15    Comment: Performed at [facility] [address], [place], [place] [address]  Iron and TIBC     Status: None   Collection Time: 12/29/18  1:53 PM  Result Value Ref Range   Iron 68 28 - 170 ug/dL   TIBC 291 250 - 450 ug/dL   Saturation Ratios 23 10.4 - 31.8 %   UIBC 223 ug/dL    Comment: Performed at [facility],  [place] [address], [place], [place] [address]  Vitamin B12     Status: Abnormal   Collection Time: 12/29/18  1:53 PM  Result Value Ref Range   Vitamin B-12 158 (L) 180 - 914 pg/mL    Comment: (NOTE) This assay is not validated for testing neonatal or myeloproliferative syndrome specimens for Vitamin B12 levels. Performed at [facility], [place] [address], [place], [place] [address]   POC occult blood, ED Provider will collect     Status: Abnormal   Collection Time: 12/29/18  2:36 PM  Result Value Ref Range   Fecal Occult Bld POSITIVE (A) NEGATIVE   Dg Chest Port 1 View  Result Date: 12/29/2018 CLINICAL DATA:  Acute respiratory distress. Recent transfusion. EXAM: PORTABLE CHEST 1 VIEW COMPARISON:  12/28/2018 FINDINGS: Cardiomegaly remains stable. Small to moderate left pleural effusion and left retrocardiac atelectasis or infiltrate show no significant change. Probable tiny right pleural effusion. Surgical clips again seen in both axillary regions. IMPRESSION: 1. No significant change in small to moderate left pleural effusion and left retrocardiac atelectasis versus infiltrate. 2. Stable cardiomegaly. Probable tiny right pleural effusion. Electronically Signed   By: Marlaine Hind M.D.   On: 12/29/2018 05:27   Dg Chest Portable 1 View  Result Date: 12/28/2018 CLINICAL DATA:  Nausea, vomiting and intermittent shortness of breath. EXAM: PORTABLE CHEST 1 VIEW COMPARISON:  11/27/2018. FINDINGS: The cardiac silhouette remains mildly enlarged. Small to moderate-sized left pleural effusion, increased. Increased left basilar airspace opacity. Minimal right basilar atelectasis with improvement. Stable diffuse peribronchial thickening and accentuation of the interstitial markings. Bilateral postmastectomy changes with bilateral axillary surgical clips. IMPRESSION: 1. Small to moderate-sized left pleural effusion, increased. 2. Increased left basilar atelectasis or pneumonia. 3. Minimal right basilar  atelectasis with improvement. 4. Stable mild cardiomegaly and chronic bronchitic changes. Electronically Signed   By: Claudie Revering M.D.   On: 12/28/2018 21:53    Pending Labs Unresulted Labs (From admission, onward)    Start     Ordered   12/30/18 0500  Protime-INR  Daily,   R     12/29/18 1026   12/30/18 1884  Basic metabolic panel  Tomorrow morning,   R     12/29/18 1412   12/29/18 1109  Hemoglobin and hematocrit, blood  Now then every 6 hours,   R (with STAT occurrences)     12/29/18 1108   12/29/18 1026  Occult blood card to lab, stool  ONCE - STAT,   STAT     12/29/18 1026  12/29/18 0713  Folate RBC  Once,   STAT     12/29/18 0712   12/29/18 0712  CBC WITH DIFFERENTIAL  Now then every 6 hours,   R (with STAT occurrences)     12/29/18 0712   12/28/18 1842  Urine culture  ONCE - STAT,   STAT     12/28/18 1847   12/28/18 1826  CBC with Differential  ONCE - STAT,   STAT     12/28/18 1825   12/28/18 1826  Culture, blood (Routine x 2)  BLOOD CULTURE X 2,   STAT     12/28/18 1825          Vitals/Pain Today's Vitals   12/29/18 1330 12/29/18 1345 12/29/18 1400 12/29/18 1445  BP: (!) 139/112 (!) 125/95 (!) 108/52 (!) 127/45  Pulse: 90 (!) 112 99 96  Resp: (!) 30 16 (!) 23 (!) 28  Temp:      TempSrc:      SpO2: 98% 94% 94% 98%  Weight:      Height:      PainSc:        Isolation Precautions No active isolations  Medications Medications  0.9 %  sodium chloride infusion (10 mL/hr Intravenous Not Given 12/29/18 0432)  omega-3 acid ethyl esters (LOVAZA) capsule 1 g (1 g Oral Given 12/29/18 1329)  cholecalciferol (VITAMIN D3) tablet 2,000 Units (2,000 Units Oral Given 12/29/18 0956)  calcium-vitamin D (OSCAL WITH D) 500-200 MG-UNIT per tablet 1 tablet (1 tablet Oral Given 12/29/18 1328)  ferrous sulfate tablet 325 mg (has no administration in time range)  magnesium oxide (MAG-OX) tablet 200 mg (has no administration in time range)  gabapentin (NEURONTIN) capsule 100 mg (100 mg  Oral Given 12/29/18 0956)  sodium chloride (OCEAN) 0.65 % nasal spray 1 spray (has no administration in time range)  prochlorperazine (COMPAZINE) tablet 10 mg (has no administration in time range)  ondansetron (ZOFRAN) tablet 8 mg (has no administration in time range)  metoprolol succinate (TOPROL-XL) 24 hr tablet 25 mg (25 mg Oral Given 12/29/18 0956)  ipratropium-albuterol (DUONEB) 0.5-2.5 (3) MG/3ML nebulizer solution 3 mL (3 mLs Inhalation Given 12/29/18 1329)  insulin aspart (novoLOG) injection 0-9 Units (3 Units Subcutaneous Given 12/29/18 1228)  insulin aspart (novoLOG) injection 0-5 Units (has no administration in time range)  sodium chloride flush (NS) 0.9 % injection 3 mL (3 mLs Intravenous Not Given 12/29/18 1107)  sodium chloride flush (NS) 0.9 % injection 3 mL (has no administration in time range)  0.9 %  sodium chloride infusion (has no administration in time range)  amiodarone (PACERONE) tablet 200 mg (200 mg Oral Given 12/29/18 0956)  cefTRIAXone (ROCEPHIN) 2 g in sodium chloride 0.9 % 100 mL IVPB ( Intravenous Stopped 12/29/18 1148)  pantoprazole (PROTONIX) injection 40 mg (40 mg Intravenous Given 12/29/18 1223)  atorvastatin (LIPITOR) tablet 20 mg (has no administration in time range)  ceFEPIme (MAXIPIME) 2 g in sodium chloride 0.9 % 100 mL IVPB (0 g Intravenous Stopped 12/28/18 2007)  metroNIDAZOLE (FLAGYL) IVPB 500 mg (0 mg Intravenous Stopped 12/28/18 2118)  sodium chloride 0.9 % bolus 1,000 mL (0 mLs Intravenous Stopped 12/28/18 1940)    And  sodium chloride 0.9 % bolus 1,000 mL (0 mLs Intravenous Stopped 12/28/18 1940)    And  sodium chloride 0.9 % bolus 500 mL (0 mLs Intravenous Stopped 12/28/18 2252)  vancomycin (VANCOCIN) 1,500 mg in sodium chloride 0.9 % 500 mL IVPB (0 mg Intravenous Stopped 12/29/18 0116)  albuterol (VENTOLIN  HFA) 108 (90 Base) MCG/ACT inhaler 2 puff (2 puffs Inhalation Given 12/28/18 2255)  0.9 %  sodium chloride infusion (Manually program via Guardrails IV  Fluids) ( Intravenous Stopped 12/29/18 1229)  furosemide (LASIX) injection 40 mg (40 mg Intravenous Given 12/29/18 0451)  furosemide (LASIX) injection 40 mg (40 mg Intravenous Given 12/29/18 1115)    Mobility walks with device High fall risk   Focused Assessments    R Recommendations: See Admitting Provider Note  Report given to:   Additional Notes: Given 4units RBCs, Pt sch to get EGD

## 2018-12-29 NOTE — Progress Notes (Signed)
*  PRELIMINARY RESULTS* Echocardiogram 2D Echocardiogram has been performed.  Leavy Cella 12/29/2018, 5:57 PM

## 2018-12-29 NOTE — ED Notes (Signed)
Pt receiving blood transfusion.

## 2018-12-30 ENCOUNTER — Encounter (HOSPITAL_COMMUNITY): Payer: Self-pay | Admitting: *Deleted

## 2018-12-30 ENCOUNTER — Inpatient Hospital Stay (HOSPITAL_COMMUNITY): Payer: Medicare Other | Admitting: Certified Registered Nurse Anesthetist

## 2018-12-30 ENCOUNTER — Encounter (HOSPITAL_COMMUNITY)
Admission: EM | Disposition: A | Discharge status: Home Health | Payer: Self-pay | Source: Home / Self Care | Attending: Internal Medicine

## 2018-12-30 DIAGNOSIS — D62 Acute posthemorrhagic anemia: Secondary | ICD-10-CM

## 2018-12-30 HISTORY — PX: ESOPHAGOGASTRODUODENOSCOPY (EGD) WITH PROPOFOL: SHX5813

## 2018-12-30 LAB — CBC WITH DIFFERENTIAL/PLATELET
Abs Immature Granulocytes: 0.12 10*3/uL — ABNORMAL HIGH (ref 0.00–0.07)
Abs Immature Granulocytes: 0.11 10*3/uL — ABNORMAL HIGH (ref 0.00–0.07)
Abs Immature Granulocytes: 0.09 10*3/uL — ABNORMAL HIGH (ref 0.00–0.07)
Basophils Absolute: 0 10*3/uL (ref 0.0–0.1)
Basophils Absolute: 0 10*3/uL (ref 0.0–0.1)
Basophils Absolute: 0 10*3/uL (ref 0.0–0.1)
Basophils Relative: 0 %
Basophils Relative: 0 %
Basophils Relative: 0 %
Eosinophils Absolute: 0.1 10*3/uL (ref 0.0–0.5)
Eosinophils Absolute: 0.2 10*3/uL (ref 0.0–0.5)
Eosinophils Absolute: 0.1 10*3/uL (ref 0.0–0.5)
Eosinophils Relative: 2 %
Eosinophils Relative: 3 %
Eosinophils Relative: 2 %
HCT: 22.7 % — ABNORMAL LOW (ref 36.0–46.0)
HCT: 24 % — ABNORMAL LOW (ref 36.0–46.0)
HCT: 24 % — ABNORMAL LOW (ref 36.0–46.0)
Hemoglobin: 7.8 g/dL — ABNORMAL LOW (ref 12.0–15.0)
Hemoglobin: 8.1 g/dL — ABNORMAL LOW (ref 12.0–15.0)
Hemoglobin: 8 g/dL — ABNORMAL LOW (ref 12.0–15.0)
Immature Granulocytes: 2 %
Immature Granulocytes: 2 %
Immature Granulocytes: 2 %
Lymphocytes Relative: 16 %
Lymphocytes Relative: 13 %
Lymphocytes Relative: 13 %
Lymphs Abs: 1 10*3/uL (ref 0.7–4.0)
Lymphs Abs: 0.8 10*3/uL (ref 0.7–4.0)
Lymphs Abs: 0.8 10*3/uL (ref 0.7–4.0)
MCH: 30.1 pg (ref 26.0–34.0)
MCH: 30.3 pg (ref 26.0–34.0)
MCH: 30 pg (ref 26.0–34.0)
MCHC: 34.4 g/dL (ref 30.0–36.0)
MCHC: 33.8 g/dL (ref 30.0–36.0)
MCHC: 33.3 g/dL (ref 30.0–36.0)
MCV: 87.6 fL (ref 80.0–100.0)
MCV: 89.9 fL (ref 80.0–100.0)
MCV: 89.9 fL (ref 80.0–100.0)
Monocytes Absolute: 0.4 10*3/uL (ref 0.1–1.0)
Monocytes Absolute: 0.4 10*3/uL (ref 0.1–1.0)
Monocytes Absolute: 0.4 10*3/uL (ref 0.1–1.0)
Monocytes Relative: 7 %
Monocytes Relative: 6 %
Monocytes Relative: 6 %
Neutro Abs: 4.6 10*3/uL (ref 1.7–7.7)
Neutro Abs: 4.5 10*3/uL (ref 1.7–7.7)
Neutro Abs: 4.7 10*3/uL (ref 1.7–7.7)
Neutrophils Relative %: 73 %
Neutrophils Relative %: 76 %
Neutrophils Relative %: 77 %
Platelets: 125 10*3/uL — ABNORMAL LOW (ref 150–400)
Platelets: 131 10*3/uL — ABNORMAL LOW (ref 150–400)
Platelets: 118 10*3/uL — ABNORMAL LOW (ref 150–400)
RBC: 2.59 MIL/uL — ABNORMAL LOW (ref 3.87–5.11)
RBC: 2.67 MIL/uL — ABNORMAL LOW (ref 3.87–5.11)
RBC: 2.67 MIL/uL — ABNORMAL LOW (ref 3.87–5.11)
RDW: 16.7 % — ABNORMAL HIGH (ref 11.5–15.5)
RDW: 17.2 % — ABNORMAL HIGH (ref 11.5–15.5)
RDW: 17.5 % — ABNORMAL HIGH (ref 11.5–15.5)
WBC: 6.2 10*3/uL (ref 4.0–10.5)
WBC: 5.8 10*3/uL (ref 4.0–10.5)
WBC: 6.1 10*3/uL (ref 4.0–10.5)
nRBC: 2.6 % — ABNORMAL HIGH (ref 0.0–0.2)
nRBC: 2.4 % — ABNORMAL HIGH (ref 0.0–0.2)
nRBC: 1.8 % — ABNORMAL HIGH (ref 0.0–0.2)

## 2018-12-30 LAB — GLUCOSE, CAPILLARY
Glucose-Capillary: 141 mg/dL — ABNORMAL HIGH (ref 70–99)
Glucose-Capillary: 138 mg/dL — ABNORMAL HIGH (ref 70–99)
Glucose-Capillary: 170 mg/dL — ABNORMAL HIGH (ref 70–99)
Glucose-Capillary: 209 mg/dL — ABNORMAL HIGH (ref 70–99)

## 2018-12-30 LAB — BPAM RBC
Blood Product Expiration Date: 202009022359
Blood Product Expiration Date: 202009032359
Blood Product Expiration Date: 202009132359
Blood Product Expiration Date: 202009132359
ISSUE DATE / TIME: 202008142256
ISSUE DATE / TIME: 202008150220
ISSUE DATE / TIME: 202008150813
ISSUE DATE / TIME: 202008151001
Unit Type and Rh: 5100
Unit Type and Rh: 5100
Unit Type and Rh: 5100
Unit Type and Rh: 5100

## 2018-12-30 LAB — BASIC METABOLIC PANEL
Anion gap: 8 (ref 5–15)
BUN: 26 mg/dL — ABNORMAL HIGH (ref 8–23)
CO2: 24 mmol/L (ref 22–32)
Calcium: 6.4 mg/dL — CL (ref 8.9–10.3)
Chloride: 111 mmol/L (ref 98–111)
Creatinine, Ser: 1.05 mg/dL — ABNORMAL HIGH (ref 0.44–1.00)
GFR calc Af Amer: >60 mL/min (ref >60)
GFR calc non Af Amer: 52 mL/min — ABNORMAL LOW (ref >60)
Glucose, Bld: 150 mg/dL — ABNORMAL HIGH (ref 70–99)
Potassium: 4.3 mmol/L (ref 3.5–5.1)
Sodium: 143 mmol/L (ref 135–145)

## 2018-12-30 LAB — TYPE AND SCREEN
ABO/RH(D): O POS
Antibody Screen: NEGATIVE
Donor AG Type: NEGATIVE
Donor AG Type: NEGATIVE
Donor AG Type: NEGATIVE
Donor AG Type: NEGATIVE
Unit division: 0
Unit division: 0
Unit division: 0
Unit division: 0

## 2018-12-30 LAB — URINE CULTURE: Culture: NO GROWTH

## 2018-12-30 LAB — ECHOCARDIOGRAM COMPLETE
Height: 63.5 in
Weight: 3046.4 oz

## 2018-12-30 LAB — PROTIME-INR
INR: 1.5 — ABNORMAL HIGH (ref 0.8–1.2)
Prothrombin Time: 17.7 seconds — ABNORMAL HIGH (ref 11.4–15.2)

## 2018-12-30 LAB — CK: Total CK: 140 U/L (ref 38–234)

## 2018-12-30 LAB — ALBUMIN: Albumin: 2.1 g/dL — ABNORMAL LOW (ref 3.5–5.0)

## 2018-12-30 SURGERY — ESOPHAGOGASTRODUODENOSCOPY (EGD) WITH PROPOFOL
Anesthesia: Monitor Anesthesia Care

## 2018-12-30 MED ORDER — LACTATED RINGERS IV SOLN
INTRAVENOUS | Status: DC
  Administered 2018-12-30: 09:00:00 via INTRAVENOUS

## 2018-12-30 MED ORDER — CALCIUM GLUCONATE-NACL 2-0.675 GM/100ML-% IV SOLN
2.0000 g | Freq: Once | INTRAVENOUS | Status: AC
  Administered 2018-12-30: 2000 mg via INTRAVENOUS
  Filled 2018-12-30: qty 100

## 2018-12-30 MED ORDER — PROPOFOL 10 MG/ML IV BOLUS
INTRAVENOUS | Status: DC | PRN
  Administered 2018-12-30 (×2): 20 mg via INTRAVENOUS

## 2018-12-30 MED ORDER — IPRATROPIUM-ALBUTEROL 0.5-2.5 (3) MG/3ML IN SOLN: 3.0000 mL | RESPIRATORY_TRACT | Status: DC | PRN

## 2018-12-30 MED ORDER — POLYETHYLENE GLYCOL 3350 17 G PO PACK
17.0000 g | PACK | Freq: Two times a day (BID) | ORAL | Status: DC
  Administered 2018-12-30 – 2018-12-31 (×3): 17 g via ORAL
  Filled 2018-12-30 (×6): qty 1

## 2018-12-30 MED ORDER — PHENYLEPHRINE HCL (PRESSORS) 10 MG/ML IV SOLN
INTRAVENOUS | Status: DC | PRN
  Administered 2018-12-30 (×2): 80 ug via INTRAVENOUS

## 2018-12-30 MED ORDER — FUROSEMIDE 10 MG/ML IJ SOLN
40.0000 mg | Freq: Every day | INTRAMUSCULAR | Status: DC
  Administered 2018-12-30 – 2019-01-03 (×5): 40 mg via INTRAVENOUS
  Filled 2018-12-30 (×5): qty 4

## 2018-12-30 MED ORDER — IPRATROPIUM-ALBUTEROL 0.5-2.5 (3) MG/3ML IN SOLN
3.0000 mL | Freq: Three times a day (TID) | RESPIRATORY_TRACT | Status: DC
  Administered 2018-12-31 – 2019-01-03 (×10): 3 mL via RESPIRATORY_TRACT
  Filled 2018-12-30 (×10): qty 3

## 2018-12-30 MED ORDER — LACTATED RINGERS IV SOLN
INTRAVENOUS | Status: DC | PRN
  Administered 2018-12-30: 11:00:00 via INTRAVENOUS

## 2018-12-30 MED ORDER — PROPOFOL 500 MG/50ML IV EMUL
INTRAVENOUS | Status: DC | PRN
  Administered 2018-12-30: 50 ug/kg/min via INTRAVENOUS

## 2018-12-30 SURGICAL SUPPLY — 15 items

## 2018-12-30 NOTE — Transfer of Care (Signed)
Immediate Anesthesia Transfer of Care Note  Patient: Brittany Porter  Procedure(s) Performed: ESOPHAGOGASTRODUODENOSCOPY (EGD) WITH PROPOFOL (N/A )  Patient Location: Endoscopy Unit  Anesthesia Type:MAC  Level of Consciousness: awake, alert , oriented and patient cooperative  Airway & Oxygen Therapy: Patient Spontanous Breathing and Patient connected to face mask oxygen  Post-op Assessment: Report given to RN and Post -op Vital signs reviewed and stable  Post vital signs: Reviewed and stable  Last Vitals:  Vitals Value Taken Time  BP    Temp    Pulse    Resp    SpO2      Last Pain:  Vitals:   12/30/18 0900  TempSrc: Temporal  PainSc: 0-No pain         Complications: No apparent anesthesia complications

## 2018-12-30 NOTE — Anesthesia Postprocedure Evaluation (Signed)
Anesthesia Post Note  Patient: Brittany Porter  Procedure(s) Performed: ESOPHAGOGASTRODUODENOSCOPY (EGD) WITH PROPOFOL (N/A )     Patient location during evaluation: PACU Anesthesia Type: MAC Level of consciousness: awake and alert Pain management: pain level controlled Vital Signs Assessment: post-procedure vital signs reviewed and stable Respiratory status: spontaneous breathing, nonlabored ventilation, respiratory function stable and patient connected to nasal cannula oxygen Cardiovascular status: stable and blood pressure returned to baseline Postop Assessment: no apparent nausea or vomiting Anesthetic complications: no    Last Vitals:  Vitals:   12/30/18 1125 12/30/18 1439  BP: (!) 124/41   Pulse: 84 93  Resp: (!) 25 20  Temp:    SpO2: 100% 93%    Last Pain:  Vitals:   12/30/18 1055  TempSrc: Temporal  PainSc: 0-No pain                 Alichia [place] DAVID

## 2018-12-30 NOTE — Progress Notes (Signed)
Patient's endoscopy was well-tolerated, and was unrevealing for any prospective source of bleeding.  (See dictated report)  Given the severity of this patient's blood loss, and the relatively low likelihood of finding a remediable or correctable source even if we evaluate the the rest of the GI tract, I think the next step should be to consult Cardiology (has seen Dr. Rayann Heman) to get their opinion as to the appropriateness of continuing anticoagulation therapy in this patient.  If they feel that it is appropriate and important to continue anticoagulation, I would then favor colonoscopy as our next step, to be followed by a small bowel capsule endoscopy.  While sorting these things out, I will place the patient on a clear liquid diet and administer MiraLAX to begin cleaning out the colon, in the event that the decision is made to proceed with colonoscopy next week.  Cleotis Nipper, M.D. Pager [phone] If no answer or after 5 PM call [phone]

## 2018-12-30 NOTE — Anesthesia Preprocedure Evaluation (Signed)
Anesthesia Evaluation  Patient identified by MRN, date of birth, ID band Patient awake    Reviewed: Allergy & Precautions, NPO status , Patient's Chart, lab work & pertinent test results  Airway Mallampati: I  TM Distance: >3 FB Neck ROM: Full    Dental   Pulmonary    Pulmonary exam normal        Cardiovascular Normal cardiovascular exam     Neuro/Psych    GI/Hepatic   Endo/Other  diabetes, Type 2, Oral Hypoglycemic Agents  Renal/GU      Musculoskeletal   Abdominal   Peds  Hematology   Anesthesia Other Findings   Reproductive/Obstetrics                             Anesthesia Physical Anesthesia Plan  ASA: III  Anesthesia Plan: MAC   Post-op Pain Management:    Induction: Intravenous  PONV Risk Score and Plan: 2 and Treatment may vary due to age or medical condition  Airway Management Planned: Simple Face Mask  Additional Equipment:   Intra-op Plan:   Post-operative Plan:   Informed Consent:   Plan Discussed with:   Anesthesia Plan Comments: (No IV access will need a central line. Discussed with Dr Cristina Gong)        Anesthesia Quick Evaluation

## 2018-12-30 NOTE — Anesthesia Procedure Notes (Signed)
Central Venous Catheter Insertion Performed by: Lillia Abed, MD Start/End8/16/2020 10:15 AM, 12/30/2018 10:25 AM Patient location: Pre-op. Preanesthetic checklist: patient identified, IV checked, risks and benefits discussed, surgical consent, monitors and equipment checked, pre-op evaluation, timeout performed and anesthesia consent Position: Trendelenburg Lidocaine 1% used for infiltration and patient sedated Hand hygiene performed  and maximum sterile barriers used  Catheter size: 8 Fr Total catheter length 16. Central line was placed.Double lumen Procedure performed using ultrasound guided technique. Ultrasound Notes:anatomy identified, needle tip was noted to be adjacent to the nerve/plexus identified, no ultrasound evidence of intravascular and/or intraneural injection and image(s) printed for medical record Attempts: 1 Following insertion, dressing applied, line sutured and Biopatch. Post procedure assessment: blood return through all ports, free fluid flow and no air  Patient tolerated the procedure well with no immediate complications.

## 2018-12-30 NOTE — Op Note (Signed)
[facility] Patient Name: Brittany Porter Procedure Date : 12/30/2018 MRN: [id number] Attending MD: Ronald Lobo , MD Date of Birth: January 23, 1946 CSN: [id number] Age: 73 Admit Type: Inpatient Procedure:                Upper GI endoscopy Indications:              Acute post hemorrhagic anemia, Melena--patient on                            Xarelto and occasional Aleve with no PPI coverage                            had 6 gm drop in hgb (to 2.6) over the past 6                            weeks, with dark, maroon-tinged stool on rectal                            exam. Providers:                Ronald Lobo, MD, Glori Bickers, RN, Laverda Sorenson, Technician, Tawni Carnes, CRNA Referring MD:              Medicines:                Monitored Anesthesia Care Complications:            No immediate complications. Estimated Blood Loss:     Estimated blood loss: none. Procedure:                Pre-Anesthesia Assessment:                           - Prior to the procedure, a History and Physical                            was performed, and patient medications and                            allergies were reviewed. The patient's tolerance of                            previous anesthesia was also reviewed. The risks                            and benefits of the procedure and the sedation                            options and risks were discussed with the patient.                            All questions were answered, and informed consent  was obtained. Prior Anticoagulants: The patient has                            taken Xarelto (rivaroxaban), last dose was 3 days                            prior to procedure. ASA Grade Assessment: III - A                            patient with severe systemic disease. After                            reviewing the risks and benefits, the patient was                            deemed in  satisfactory condition to undergo the                            procedure.                           After obtaining informed consent, the endoscope was                            passed under direct vision. Throughout the                            procedure, the patient's blood pressure, pulse, and                            oxygen saturations were monitored continuously. The                            GIF-H190 ([id number] Olympus gastroscope was                            introduced through the mouth, and advanced to the                            second part of duodenum. The upper GI endoscopy was                            accomplished without difficulty. The patient                            tolerated the procedure well. Scope In: Scope Out: Findings:      The larynx was normal.      The examined esophagus was normal.      A 1 cm hiatal hernia with a minimal Schatzki's ring was present.      The stomach contained no blood or coffee ground material.      The entire examined stomach was normal.      The cardia and gastric fundus were normal on retroflexion.      The examined duodenum was normal.  The stomach and duodenum were re-examined with no additional findings. Impression:               - No active bleeding or blood in stomach at time of                            procedure. No prospective source for patient's                            blood loss identified.                           - Normal larynx.                           - Normal esophagus.                           - 1 cm hiatal hernia.                           - Normal stomach.                           - Normal examined duodenum.                           - No specimens collected. Recommendation:           - Consider performing a colonoscopy this admission. Procedure Code(s):        --- Professional ---                           [id number], Esophagogastroduodenoscopy, flexible,                            transoral;  diagnostic, including collection of                            specimen(s) by brushing or washing, when performed                            (separate procedure) Diagnosis Code(s):        --- Professional ---                           D62, Acute posthemorrhagic anemia                           K92.1, Melena (includes Hematochezia) CPT copyright 2019 American Medical Association. All rights reserved. The codes documented in this report are preliminary and upon coder review may  be revised to meet current compliance requirements. Ronald Lobo, MD 12/30/2018 10:56:06 AM This report has been signed electronically. Number of Addenda: 0

## 2018-12-30 NOTE — Progress Notes (Signed)
Hemoglobin 8.1, versus posttransfusion hemoglobin from yesterday afternoon of 9.1. This is compatible with posttransfusion equilibration rather than ongoing blood loss.    BUN this morning is slightly lower at 26, versus 31 on admission but again this could simply reflect hydration.  Patient feels okay, without shortness of breath or chest pain.  She indicates that she does not take Aleve on a daily basis, only "when she really needs it."  She uses it for headaches.  She thinks Tylenol works about as well for that purpose, so I educated the patient on the ulcerogenic effect of Aleve as compared to Tylenol, which could be particularly problematic in a patient, such as herself, on chronic anticoagulation.  Proceed to endoscopy later this morning.  There are currently problems with IV access.  Cleotis Nipper, M.D. Pager [phone] If no answer or after 5 PM call [phone]

## 2018-12-30 NOTE — Progress Notes (Signed)
PROGRESS NOTE  Brittany Porter RWE:[id number] DOB: 1946/02/17   PCP: [name], MD  Patient is from: Home.  Recently started using walker  DOA: 12/28/2018 LOS: 2  Brief Narrative / Interim history: 73 year old female with history of bilateral breast cancer/bilateral mastectomy with bone, liver and lung mets currently on Xeloda, ovarian and endometrial cancer, IDDM-2, HTN, A. fib on Xarelto, chronic respiratory failure on 2 to 4 L, and chronic pain on occasional NSAIDs presenting with generalized weakness, DOE and palpitation, and admitted for severe symptomatic anemia with hemoglobin of 2.7.  Denies melena or hematochezia.  Mild temp to 99.2.  BP 84/47.  Pulse ox 97.  RR 30.  100% on 4 L.  Hgb 2.6.  WBC 7.6.  Platelet 185.  INR 3.1.  Creatinine 1.07.  BUN 31.  Glucose 259.  Bicarb 15.  AG 16.  LA 7.0.  CXR with stable small to moderate sized left pleural effusion, bibasilar atelectasis versus pneumonia.  Admitted for symptomatic anemia.  4 units of blood ordered.  Also started on broad-spectrum antibiotics for possible pneumonia.  Subjective: No major events overnight of this morning.  She had normal EGD without significant finding this morning.  H&H remained stable.  Has no complaints this morning.  She denies chest pain, dyspnea, palpitation, weakness, lightheadedness, GI or GU symptoms.  Upper extremity edema improved.  Feels hungry.  Objective: Vitals:   12/30/18 1055 12/30/18 1113 12/30/18 1125 12/30/18 1439  BP: (!) 102/54 (!) 126/42 (!) 124/41   Pulse: 90 91 84 93  Resp: 16 (!) 27 (!) 25 20  Temp: 97.6 F (36.4 C)     TempSrc: Temporal     SpO2: 100% 100% 100% 93%  Weight:      Height:        Intake/Output Summary (Last 24 hours) at 12/30/2018 1535 Last data filed at 12/30/2018 1049 Gross per 24 hour  Intake 264.56 ml  Output -  Net 264.56 ml   Filed Weights   12/28/18 1846 12/29/18 1625 12/30/18 0456  Weight: 81.6 kg 86.4 kg 85.3 kg    Examination:  GENERAL: No  acute distress.  Appears well.  HEENT: MMM.  Vision and hearing grossly intact.  Sclera anicteric. NECK: Supple.  No apparent JVD.  RESP:  No IWOB. Good air movement bilaterally. CVS:  RRR. Heart sounds normal.  ABD/GI/GU: Bowel sounds present. Soft. Non tender.  MSK/EXT:  Moves extremities.  Trace edema bilaterally in upper and lower extremities. SKIN: no apparent skin lesion or wound NEURO: Awake, alert and oriented appropriately.  No gross deficit.  PSYCH: Calm. Normal affect.    I have personally reviewed the following labs and images:  Radiology Studies: No results found.  Microbiology: Recent Results (from the past 240 hour(s))  Culture, blood (Routine x 2)     Status: None (Preliminary result)   Collection Time: 12/28/18  7:18 AM   Specimen: BLOOD RIGHT HAND  Result Value Ref Range Status   Specimen Description BLOOD RIGHT HAND  Final   Special Requests   Final    BOTTLES DRAWN AEROBIC AND ANAEROBIC Blood Culture adequate volume   Culture   Final    NO GROWTH 1 DAY Performed at [facility], [address], [place], [place] [address]    Report Status PENDING  Incomplete  Culture, blood (Routine x 2)     Status: None (Preliminary result)   Collection Time: 12/28/18  7:25 AM   Specimen: BLOOD  Result Value Ref Range  Status   Specimen Description BLOOD RIGHT ANTECUBITAL  Final   Special Requests   Final    BOTTLES DRAWN AEROBIC AND ANAEROBIC Blood Culture adequate volume   Culture   Final    NO GROWTH 1 DAY Performed at [facility], [address] [place], [place] [address]    Report Status PENDING  Incomplete  Urine culture     Status: None   Collection Time: 12/28/18  9:48 PM   Specimen: In/Out Cath Urine  Result Value Ref Range Status   Specimen Description IN/OUT CATH URINE  Final   Special Requests NONE  Final   Culture   Final    NO GROWTH Performed at [facility], [address], [place], [place] [address]    Report Status  12/30/2018 FINAL  Final  SARS Coronavirus 2 [facility] order, Performed in [facility] hospital lab) Nasopharyngeal Nasopharyngeal Swab     Status: None   Collection Time: 12/28/18  9:48 PM   Specimen: Nasopharyngeal Swab  Result Value Ref Range Status   SARS Coronavirus 2 NEGATIVE NEGATIVE Final    Comment: (NOTE) If result is NEGATIVE SARS-CoV-2 target nucleic acids are NOT DETECTED. The SARS-CoV-2 RNA is generally detectable in upper and lower  respiratory specimens during the acute phase of infection. The lowest  concentration of SARS-CoV-2 viral copies this assay can detect is 250  copies / mL. A negative result does not preclude SARS-CoV-2 infection  and should not be used as the sole basis for treatment or other  patient management decisions.  A negative result may occur with  improper specimen collection / handling, submission of specimen other  than nasopharyngeal swab, presence of viral mutation(s) within the  areas targeted by this assay, and inadequate number of viral copies  (<250 copies / mL). A negative result must be combined with clinical  observations, patient history, and epidemiological information. If result is POSITIVE SARS-CoV-2 target nucleic acids are DETECTED. The SARS-CoV-2 RNA is generally detectable in upper and lower  respiratory specimens dur ing the acute phase of infection.  Positive  results are indicative of active infection with SARS-CoV-2.  Clinical  correlation with patient history and other diagnostic information is  necessary to determine patient infection status.  Positive results do  not rule out bacterial infection or co-infection with other viruses. If result is PRESUMPTIVE POSTIVE SARS-CoV-2 nucleic acids MAY BE PRESENT.   A presumptive positive result was obtained on the submitted specimen  and confirmed on repeat testing.  While 2019 novel coronavirus  (SARS-CoV-2) nucleic acids may be present in the submitted sample  additional  confirmatory testing may be necessary for epidemiological  and / or clinical management purposes  to differentiate between  SARS-CoV-2 and other Sarbecovirus currently known to infect humans.  If clinically indicated additional testing with an alternate test  methodology [phone]) is advised. The SARS-CoV-2 RNA is generally  detectable in upper and lower respiratory sp ecimens during the acute  phase of infection. The expected result is Negative. Fact Sheet for Patients:  StrictlyIdeas.no Fact Sheet for Healthcare Providers: BankingDealers.co.za This test is not yet approved or cleared by the [place] FDA and has been authorized for detection and/or diagnosis of SARS-CoV-2 by FDA under an Emergency Use Authorization (EUA).  This EUA will remain in effect (meaning this test can be used) for the duration of the COVID-19 declaration under Section 564(b)(1) of the Act, 21 U.S.C. section 360bbb-3(b)(1), unless the authorization is terminated or revoked  sooner. Performed at [facility], [address], [place], [place] [address]     Sepsis Labs: Invalid input(s): PROCALCITONIN, LACTICIDVEN  Urine analysis:    Component Value Date/Time   COLORURINE YELLOW 12/28/2018 2148   APPEARANCEUR CLEAR 12/28/2018 2148   LABSPEC 1.015 12/28/2018 2148   PHURINE 5.0 12/28/2018 2148   GLUCOSEU 50 (A) 12/28/2018 2148   HGBUR NEGATIVE 12/28/2018 2148   [place] NEGATIVE 12/28/2018 2148   KETONESUR 5 (A) 12/28/2018 2148   PROTEINUR NEGATIVE 12/28/2018 2148   UROBILINOGEN 0.2 05/15/2012 0221   NITRITE NEGATIVE 12/28/2018 2148   LEUKOCYTESUR NEGATIVE 12/28/2018 2148    Anemia Panel: Recent Labs    12/29/18 1353  VITAMINB12 158*  TIBC 291  IRON 68    Thyroid Function Tests: No results for input(s): TSH, T4TOTAL, FREET4, T3FREE, THYROIDAB in the last 72 hours.  Lipid Profile: No results for input(s): CHOL, HDL, LDLCALC, TRIG,  CHOLHDL, LDLDIRECT in the last 72 hours.  CBG: Recent Labs  Lab 12/29/18 1222 12/29/18 1639 12/29/18 2123 12/30/18 0746 12/30/18 1149  GLUCAP 229* 227* 172* 141* 138*    HbA1C: No results for input(s): HGBA1C in the last 72 hours.  BNP (last 3 results): No results for input(s): PROBNP in the last 8760 hours.  Cardiac Enzymes: Recent Labs  Lab 12/30/18 0658  CKTOTAL 140    Coagulation Profile: Recent Labs  Lab 12/28/18 2140 12/30/18 0658  INR 3.1* 1.5*    Liver Function Tests: Recent Labs  Lab 12/28/18 1826 12/29/18 1353  AST 35 39  ALT 11 14  ALKPHOS 52 59  BILITOT 1.0 0.9  PROT 4.0* 4.5*  ALBUMIN 2.2* 2.5*   No results for input(s): LIPASE, AMYLASE in the last 168 hours. No results for input(s): AMMONIA in the last 168 hours.  Basic Metabolic Panel: Recent Labs  Lab 12/28/18 1826 12/29/18 1353 12/30/18 0658  NA 141 144 143  K 4.2 3.0* 4.3  CL 110 109 111  CO2 15* 23 24  GLUCOSE 259* 204* 150*  BUN 31* 29* 26*  CREATININE 1.07* 1.08* 1.05*  CALCIUM 6.5* 6.6* 6.4*   GFR: Estimated Creatinine Clearance: 48.5 mL/min (A) (by C-G formula based on SCr of 1.05 mg/dL (H)).  CBC: Recent Labs  Lab 12/29/18 1352  12/29/18 1843 12/29/18 2301 12/30/18 0129 12/30/18 0658 12/30/18 1345  WBC 7.1  --  7.5  --  6.2 5.8 6.1  NEUTROABS 6.4  --  5.9  --  4.6 4.5 4.7  HGB 9.5*   < > 9.1* 7.8* 7.8* 8.1* 8.0*  HCT 28.1*   < > 26.8* 22.6* 22.7* 24.0* 24.0*  MCV 88.9  --  88.4  --  87.6 89.9 89.9  PLT 138*  --  128*  --  125* 131* 118*   < > = values in this interval not displayed.    Procedures:  8/16-EGD negative  Microbiology summarized: SARS-CoV-2 negative. Blood cultures pending  Assessment & Plan: Severe symptomatic anemia: denies melena or hematochezia.  She is on Xarelto and occasional NSAID.  FOBT positive.  She is also on Xeloda which could contribute.  No signs of hemolysis. -Hgb 10 (and 6/20)>> 2.6 (admit)> 3 units> 9.1 (exaggerated)>  8.1 -Oncology, Dr. Providence Lanius, exclude GIB and outpatient follow-up if Hgb stable -EGD on 8/16-Negative -Monitor H&H.  Hold Xarelto.  Continue PPI.  Clear liquid diet. -Counseled on NSAIDs. -GI wants cardiology insights about the need for Xarelto before proceeding with colonoscopy.  Discussed with cardiology, Dr. Lovena Le  who recommended no further anticoagulation.  Acute diastolic CHF exacerbation: Echo with EF of 55 to 60%, moderate DD, moderate LAE and apical hypokinesis (EF of 45 to 50%, diffuse hypokinesis and G2 DD in 01/2018).  Exacerbation likely due to severe anemia, IV fluid resuscitation, blood transfusion and NSAID use.  Intake and output not captured but clinically improved. -Continue IV Lasix 40 mg daily -Daily weight, intake output and renal functions -Salt and fluid restriction  Abnormal chest x-ray: Portable CXR revealed stable small to moderate left pleural effusion with left retrocardiac atelectasis versus infiltrate.  I suspect this to be due to cancer, anemia and CHF versus infectious process.  Cultures negative. -We will narrow antibiotics to ceftriaxone to complete 3 days course. -Manage anemia and CHF as above.  History of bilateral breast cancer/bilateral mastectomy with metastasis to liver, bone and lung.  Followed by Dr. Lindi Adie -Discussed with Dr. Alen Blew who recommended holding Xeloda in the setting of severe anemia.  Mild DKA in patient with IDDM-2/hyperglycemia: Acidosis resolved which could be also due to LA.  -Continue current regimen and CBG monitoring -Check A1c  Lactic acidosis: Likely due to #1 versus infectious process.  Resolved.  Coagulopathy: PT/INR elevated.  No signs of hemolysis.  LFT and T bili within normal range.  Improved. -Continue holding Xarelto -Continue monitoring  Paroxysmal atrial fibrillation: Currently rate and rhythm controlled.  She is on amiodarone 200 mg twice daily and Xarelto at home. Chad-2-Vasc score 6 (annual stroke or  major VTE risk of 11%). HAS-BLED-3 (5.9% and 4.1% risk of major bleeding with Xarelto and Eliquis respectively).  -We will continue amiodarone 200 mg daily here -Discussed with cardiology, Dr. Lovena Le who recommended no further anticoagulation.   DVT prophylaxis: SCD Code Status: Full code-confirmed with patient and patient's daughter Family Communication: Patient's daughter at bedside Disposition Plan: Remains inpatient Consultants: Gastroenterology and oncology   Antimicrobials: Anti-infectives (From admission, onward)   Start     Dose/Rate Route Frequency Ordered Stop   12/29/18 2000  vancomycin (VANCOCIN) IVPB 750 mg/150 ml premix  Status:  Discontinued     750 mg 150 mL/hr over 60 Minutes Intravenous Every 24 hours 12/28/18 1952 12/29/18 1101   12/29/18 1115  cefTRIAXone (ROCEPHIN) 2 g in sodium chloride 0.9 % 100 mL IVPB     2 g 200 mL/hr over 30 Minutes Intravenous Every 24 hours 12/29/18 1102     12/29/18 0800  ceFEPIme (MAXIPIME) 2 g in sodium chloride 0.9 % 100 mL IVPB  Status:  Discontinued     2 g 200 mL/hr over 30 Minutes Intravenous Every 12 hours 12/28/18 1952 12/29/18 1101   12/28/18 1915  vancomycin (VANCOCIN) 1,500 mg in sodium chloride 0.9 % 500 mL IVPB     1,500 mg 250 mL/hr over 120 Minutes Intravenous  Once 12/28/18 1904 12/29/18 0116   12/28/18 1900  ceFEPIme (MAXIPIME) 2 g in sodium chloride 0.9 % 100 mL IVPB     2 g 200 mL/hr over 30 Minutes Intravenous  Once 12/28/18 1847 12/28/18 2007   12/28/18 1900  metroNIDAZOLE (FLAGYL) IVPB 500 mg     500 mg 100 mL/hr over 60 Minutes Intravenous  Once 12/28/18 1847 12/28/18 2118   12/28/18 1900  vancomycin (VANCOCIN) IVPB 1000 mg/200 mL premix  Status:  Discontinued     1,000 mg 200 mL/hr over 60 Minutes Intravenous  Once 12/28/18 1847 12/28/18 1904      Sch Meds:  Scheduled Meds: . amiodarone  200 mg Oral Daily  .  atorvastatin  20 mg Oral q1800  . calcium-vitamin D  1 tablet Oral BID WC  . cholecalciferol   2,000 Units Oral Daily  . furosemide  40 mg Intravenous Daily  . gabapentin  100 mg Oral BID  . insulin aspart  0-5 Units Subcutaneous QHS  . insulin aspart  0-9 Units Subcutaneous TID WC  . ipratropium-albuterol  3 mL Inhalation Q6H  . magnesium oxide  200 mg Oral QHS  . metoprolol succinate  25 mg Oral Daily  . omega-3 acid ethyl esters  1 g Oral BID  . pantoprazole (PROTONIX) IV  40 mg Intravenous Q12H  . polyethylene glycol  17 g Oral BID  . sodium chloride flush  3 mL Intravenous Q12H   Continuous Infusions: . sodium chloride    . sodium chloride    . cefTRIAXone (ROCEPHIN)  IV 2 g (12/30/18 1222)   PRN Meds:.sodium chloride, ferrous sulfate, ondansetron, prochlorperazine, sodium chloride, sodium chloride flush  35 minutes with more than 50% spent in reviewing records, counseling patient and coordinating care.  Filimon Miranda T. Seagrove  If 7PM-7AM, please contact night-coverage [email or web address] Password [facility] 12/30/2018, 3:35 PM

## 2018-12-30 NOTE — Consult Note (Signed)
[facility] Nurse wound consult note Reason for Consult: full thickness skin loss at sternum. Patient had radiation therapy several years ago for breast cancer, this area is in radiation field and was irritated by her undergarment (bra).  Wound type: full thickness, trauma vs secondary to irradiation therapy Pressure Injury POA: N/A Measurement:1.8cm x 1.2cm x 0.2cm  Wound AFH:SVEX and yellow Drainage (amount, consistency, odor) serous to light yellow Periwound: intact  Dressing procedure/placement/frequency: I will provide Nursing with guidance for the care of this lesion using xeroform gauze and topped with a silicone foam dressing. I have asked Nursing to provide a few of these dressings for her use at home following discharge.  POC discussed with patient and her son who is visiting.  [facility] nursing team will not follow, but will remain available to this patient, the nursing and medical teams.  Please re-consult if needed. Thanks, Maudie Flakes, MSN, RN, [place], Arther Abbott  Pager# [phone]

## 2018-12-30 NOTE — Interval H&P Note (Signed)
History and Physical Interval Note:  12/30/2018 9:49 AM  Brittany Porter  has presented today for surgery, with the diagnosis of melena.  The various methods of treatment have been discussed with the patient. After consideration of risks, benefits and other options for treatment, the patient has consented to  Procedure(s): ESOPHAGOGASTRODUODENOSCOPY (EGD) WITH PROPOFOL (N/A) as a surgical intervention.  The patient's history has been reviewed, patient examined, no change in status, stable for surgery.  I have reviewed the patient's chart and labs.  Questions were answered to the patient's satisfaction.     Youlanda Mighty [name]

## 2018-12-31 ENCOUNTER — Telehealth: Payer: Self-pay

## 2018-12-31 LAB — FOLATE RBC
Folate, Hemolysate: 269 ng/mL
Folate, RBC: 937 ng/mL (ref >498)
Hematocrit: 28.7 % — ABNORMAL LOW (ref 34.0–46.6)

## 2018-12-31 LAB — COMPREHENSIVE METABOLIC PANEL
ALT: 12 U/L (ref 0–44)
AST: 28 U/L (ref 15–41)
Albumin: 1.9 g/dL — ABNORMAL LOW (ref 3.5–5.0)
Alkaline Phosphatase: 53 U/L (ref 38–126)
Anion gap: 8 (ref 5–15)
BUN: 19 mg/dL (ref 8–23)
CO2: 27 mmol/L (ref 22–32)
Calcium: 6.6 mg/dL — ABNORMAL LOW (ref 8.9–10.3)
Chloride: 106 mmol/L (ref 98–111)
Creatinine, Ser: 0.91 mg/dL (ref 0.44–1.00)
GFR calc Af Amer: >60 mL/min (ref >60)
GFR calc non Af Amer: >60 mL/min (ref >60)
Glucose, Bld: 129 mg/dL — ABNORMAL HIGH (ref 70–99)
Potassium: 3.4 mmol/L — ABNORMAL LOW (ref 3.5–5.1)
Sodium: 141 mmol/L (ref 135–145)
Total Bilirubin: 0.8 mg/dL (ref 0.3–1.2)
Total Protein: 3.7 g/dL — ABNORMAL LOW (ref 6.5–8.1)

## 2018-12-31 LAB — HEMOGLOBIN A1C
Hgb A1c MFr Bld: 6 % — ABNORMAL HIGH (ref 4.8–5.6)
Mean Plasma Glucose: 125.5 mg/dL

## 2018-12-31 LAB — PROTIME-INR
INR: 1.4 — ABNORMAL HIGH (ref 0.8–1.2)
Prothrombin Time: 16.5 seconds — ABNORMAL HIGH (ref 11.4–15.2)

## 2018-12-31 LAB — GLUCOSE, CAPILLARY
Glucose-Capillary: 123 mg/dL — ABNORMAL HIGH (ref 70–99)
Glucose-Capillary: 154 mg/dL — ABNORMAL HIGH (ref 70–99)
Glucose-Capillary: 105 mg/dL — ABNORMAL HIGH (ref 70–99)
Glucose-Capillary: 174 mg/dL — ABNORMAL HIGH (ref 70–99)

## 2018-12-31 LAB — CBC
HCT: 22.2 % — ABNORMAL LOW (ref 36.0–46.0)
Hemoglobin: 7.2 g/dL — ABNORMAL LOW (ref 12.0–15.0)
MCH: 30.3 pg (ref 26.0–34.0)
MCHC: 32.4 g/dL (ref 30.0–36.0)
MCV: 93.3 fL (ref 80.0–100.0)
Platelets: 92 10*3/uL — ABNORMAL LOW (ref 150–400)
RBC: 2.38 MIL/uL — ABNORMAL LOW (ref 3.87–5.11)
RDW: 19.2 % — ABNORMAL HIGH (ref 11.5–15.5)
WBC: 5 10*3/uL (ref 4.0–10.5)
nRBC: 1.4 % — ABNORMAL HIGH (ref 0.0–0.2)

## 2018-12-31 LAB — OCCULT BLOOD, POC DEVICE: Fecal Occult Bld: POSITIVE — AB

## 2018-12-31 MED ORDER — POTASSIUM CHLORIDE 20 MEQ PO PACK
40.0000 meq | PACK | Freq: Once | ORAL | Status: AC
  Administered 2018-12-31: 40 meq via ORAL
  Filled 2018-12-31: qty 2

## 2018-12-31 NOTE — Telephone Encounter (Signed)
RN returned call to patient's husband.  Pt currently admitted, requesting appointments for 8/18 be cancelled.  Pt will contact office to reschedule once discharged.

## 2018-12-31 NOTE — Care Management Important Message (Signed)
Important Message  Patient Details  Name: Brittany Porter MRN: [id number] Date of Birth: 17-Nov-1945   Medicare Important Message Given:  Yes     [name] 12/31/2018, 1:30 PM

## 2018-12-31 NOTE — Evaluation (Signed)
Occupational Therapy Evaluation Patient Details Name: Brittany Porter MRN: [id number] DOB: 09-01-1945 Today's Date: 12/31/2018    History of Present Illness 73 year old female with history of bilateral breast cancer/bilateral mastectomy with bone, liver and lung mets currently on Xeloda, ovarian and endometrial cancer, IDDM-2, HTN, A. fib on Xarelto, chronic respiratory failure on 2 to 4 L, and chronic pain on occasional NSAIDs presenting with generalized weakness, DOE and palpitation, and admitted 12/28/18 for severe symptomatic anemia with hemoglobin of 2.7.    Clinical Impression   Pt with decline in function and safety with ADLs and ADL mobility with impaired strength, balance and endurance. Pt reports that she lives at home with her husband and grandson and was independent with ADLs/selfcare, home mgt and was driving. Pt reports that she has a RW and cane, but does not use them. Pt currently requires max A with LB ADLs and mod - mind A with functional mobility/transfers using RW. Pt's spouse in to visit in transport chair and stats that he has fallen 3 times and injured his L knee. Pt would benefit from acute OT services to address impairments to maximize level of function and safety BP: Supine 81/47 Sitting 120/50 Standing unable to get reading x 3 attempts and pt unable to tolerate standing any longer    Follow Up Recommendations  SNF    Equipment Recommendations  3 in 1 bedside commode;Tub/shower seat    Recommendations for Other Services       Precautions / Restrictions Precautions Precautions: Fall Precaution Comments: moniter BP Restrictions Weight Bearing Restrictions: No      Mobility Bed Mobility Overal bed mobility: Needs Assistance Bed Mobility: Supine to Sit     Supine to sit: Supervision;HOB elevated     General bed mobility comments: used rails, no physical assist to sit EOB, cues to scoot to EOB  Transfers Overall transfer level: Needs  assistance Equipment used: Rolling walker (2 wheeled) Transfers: Sit to/from Stand Sit to Stand: Mod assist;Min assist         General transfer comment: mod A initially to stand from EOB with RW, min A from [facility] and to recliner. Verbal and physical cues for correct hand placement    Balance Overall balance assessment: Needs assistance Sitting-balance support: Feet supported Sitting balance-Leahy Scale: Fair     Standing balance support: Bilateral upper extremity supported;Single extremity supported;During functional activity Standing balance-Leahy Scale: Poor                             ADL either performed or assessed with clinical judgement   ADL Overall ADL's : Needs assistance/impaired Eating/Feeding: Independent;Sitting   Grooming: Wash/dry hands;Wash/dry face;Min guard;Sitting   Upper Body Bathing: Min guard;Sitting   Lower Body Bathing: Maximal assistance   Upper Body Dressing : Min guard;Sitting   Lower Body Dressing: Maximal assistance   Toilet Transfer: Moderate assistance;Minimal assistance;+2 for physical assistance;Cueing for sequencing;Cueing for safety;Ambulation;RW;BSC   Toileting- Clothing Manipulation and Hygiene: Maximal assistance;Sit to/from stand       Functional mobility during ADLs: Moderate assistance;Minimal assistance;Rolling walker;Cueing for safety;Cueing for sequencing       Vision Baseline Vision/History: Wears glasses Wears Glasses: At all times Patient Visual Report: No change from baseline       Perception     Praxis      Pertinent Vitals/Pain Pain Assessment: No/denies pain Pain Intervention(s): Monitored during session;Repositioned  BP: Supine 81/47 Sitting 120/50 Standing unable to get reading x  3 attempts and pt unable to tolerate standing any longer     Hand Dominance Right   Extremity/Trunk Assessment Upper Extremity Assessment Upper Extremity Assessment: Generalized weakness;LUE  deficits/detail LUE Deficits / Details: L hand edematous LUE Sensation: decreased light touch   Lower Extremity Assessment Lower Extremity Assessment: Defer to PT evaluation RLE Deficits / Details: ROM WFL, strength grossly assessed at 3+/5 LLE Deficits / Details: ROM WFL, strength grossly assessed at 3+/5       Communication Communication Communication: No difficulties   Cognition Arousal/Alertness: Awake/alert Behavior During Therapy: WFL for tasks assessed/performed Overall Cognitive Status: Within Functional Limits for tasks assessed                                     General Comments  BP supine 89/47 with sitting 120/50 unable to get standing BP, with sitting in recliner 87/75, Pt on 4L O2 via [place] SaO2 >90%O2 throughout session , HR max with transfer 118bpm    Exercises     Shoulder Instructions      Home Living Family/patient expects to be discharged to:: Private residence Living Arrangements: Spouse/significant other;Other relatives Available Help at Discharge: Family;Available PRN/intermittently Type of Home: House Home Access: Level entry     Home Layout: One level     Bathroom Shower/Tub: Tub/shower unit;Walk-in shower   Bathroom Toilet: Handicapped height     Home Equipment: Environmental consultant - 2 wheels;Cane - single point          Prior Functioning/Environment Level of Independence: Independent with assistive device(s);Needs assistance  Gait / Transfers Assistance Needed: pt reports that she doesn't use her cane or RW, but reaches out for wall sometimes when ambulating at home ADL's / Homemaking Assistance Needed: Pt reports that she was independent with ADLs/selfcare, home mgt and was driving            OT Problem List: Decreased strength;Impaired balance (sitting and/or standing);Obesity;Decreased knowledge of use of DME or AE;Decreased coordination;Decreased activity tolerance      OT Treatment/Interventions: Self-care/ADL training;DME  and/or AE instruction;Therapeutic activities;Therapeutic exercise;Patient/family education    OT Goals(Current goals can be found in the care plan section) Acute Rehab OT Goals Patient Stated Goal: get better OT Goal Formulation: With patient/family Time For Goal Achievement: 01/07/19 Potential to Achieve Goals: Good ADL Goals Pt Will Perform Grooming: with set-up;with supervision;sitting Pt Will Perform Upper Body Bathing: with set-up;with supervision;sitting Pt Will Perform Lower Body Bathing: with mod assist;sitting/lateral leans;sit to/from stand Pt Will Perform Upper Body Dressing: with supervision;with set-up;sitting Pt Will Transfer to Toilet: with min assist;with min guard assist;ambulating;bedside commode  OT Frequency: Min 2X/week   Barriers to D/C: Decreased caregiver support  pt lives at home with her husband and he reports that he has fallen 3 times recenly and hurt is L knee. Spouse in to visit pt in transport chair       Co-evaluation PT/OT/SLP Co-Evaluation/Treatment: Yes Reason for Co-Treatment: For patient/therapist safety;To address functional/ADL transfers   OT goals addressed during session: ADL's and self-care;Proper use of Adaptive equipment and DME      AM-PAC OT "6 Clicks" Daily Activity     Outcome Measure Help from another person eating meals?: None Help from another person taking care of personal grooming?: A Little Help from another person toileting, which includes using toliet, bedpan, or urinal?: A Lot Help from another person bathing (including washing, rinsing, drying)?: A Lot Help from another  person to put on and taking off regular upper body clothing?: A Little Help from another person to put on and taking off regular lower body clothing?: A Lot 6 Click Score: 16   End of Session Equipment Utilized During Treatment: Gait belt;Rolling walker;Other (comment);Oxygen(BSC)  Activity Tolerance: Patient limited by fatigue Patient left: in  chair;with call bell/phone within reach;with family/visitor present  OT Visit Diagnosis: Unsteadiness on feet (R26.81);Other abnormalities of gait and mobility (R26.89);Muscle weakness (generalized) (M62.81)                Time: 5784-6962 OT Time Calculation (min): 35 min Charges:  OT General Charges $OT Visit: 1 Visit OT Evaluation $OT Eval Moderate Complexity: 1 Mod    Britt Bottom 12/31/2018, 1:57 PM

## 2018-12-31 NOTE — Progress Notes (Signed)
[facility] Gastroenterology Progress Note  Brittany Porter 73 y.o. [birth year]   Subjective: Rectal bleeding yesterday. Feels ok. Sitting in bedside chair. Husband in room.  Objective: Vital signs: Vitals:   12/31/18 0737 12/31/18 0828  BP:  116/88  Pulse:  97  Resp:    Temp:    SpO2: 97%   T 98.2  Physical Exam: Gen: elderly, frail, no acute distress  HEENT: anicteric sclera CV: RRR Chest: CTA B Abd: soft, nontender, nondistended, +BS Ext: no edema  Lab Results: Recent Labs    12/30/18 0658 12/31/18 0429  NA 143 141  K 4.3 3.4*  CL 111 106  CO2 24 27  GLUCOSE 150* 129*  BUN 26* 19  CREATININE 1.05* 0.91  CALCIUM 6.4* 6.6*   Recent Labs    12/29/18 1353 12/30/18 0658 12/31/18 0429  AST 39  --  28  ALT 14  --  12  ALKPHOS 59  --  53  BILITOT 0.9  --  0.8  PROT 4.5*  --  3.7*  ALBUMIN 2.5* 2.1* 1.9*   Recent Labs    12/30/18 0658 12/30/18 1345 12/31/18 0429  WBC 5.8 6.1 5.0  NEUTROABS 4.5 4.7  --   HGB 8.1* 8.0* 7.2*  HCT 24.0* 24.0* 22.2*  MCV 89.9 89.9 93.3  PLT 131* 118* 92*      Assessment/Plan: GI bleed - EGD unrevealing. Hgb 7.2. Rectal bleeding yesterday. Since antiocoagulation will not be restarted will manage conservatively but if rebleeding occurs and persists off of anticoagulation then will need to reconsider doing an updated colonoscopy. Advance diet. Will sign off. Call if questions. F/U Dr. Penelope Porter prn.   Brittany Porter 12/31/2018, 11:10 AM  Questions please call [phone] ID: Brittany Porter, female   DOB: April 04, 1946, 73 y.o.   MRN: [id number]

## 2018-12-31 NOTE — Evaluation (Signed)
Physical Therapy Evaluation Patient Details Name: Brittany Porter MRN: [id number] DOB: August 03, 1945 Today's Date: 12/31/2018   History of Present Illness  73 year old female with history of bilateral breast cancer/bilateral mastectomy with bone, liver and lung mets currently on Xeloda, ovarian and endometrial cancer, IDDM-2, HTN, A. fib on Xarelto, chronic respiratory failure on 2 to 4 L, and chronic pain on occasional NSAIDs presenting with generalized weakness, DOE and palpitation, and admitted 12/28/18 for severe symptomatic anemia with hemoglobin of 2.7.   Clinical Impression  PTA pt living with husband in single story home with level entry. Pt reports having cane and RW but independent in ambulation and iADLs, as well as driving. Pt is currently supervision for bed mobility and mod A-minA for transfers, minA for ambulation of 3 feet with RW. Pt is limited by decreased strength and endurance. Due to the large deviation from baseline PT recommending SNF level rehab at discharge. PT will continue to follow acutely.     Follow Up Recommendations SNF    Equipment Recommendations  None recommended by PT    Recommendations for Other Services       Precautions / Restrictions Precautions Precautions: Fall Precaution Comments: moniter BP Restrictions Weight Bearing Restrictions: No      Mobility  Bed Mobility Overal bed mobility: Needs Assistance Bed Mobility: Supine to Sit     Supine to sit: Supervision;HOB elevated     General bed mobility comments: used rails, no physical assist to sit EOB, cues to scoot to EOB  Transfers Overall transfer level: Needs assistance Equipment used: Rolling walker (2 wheeled) Transfers: Sit to/from Stand Sit to Stand: Mod assist;Min assist         General transfer comment: mod A initially to stand from EOB with RW, min A from [facility] and to recliner. Verbal and physical cues for correct hand placement  Ambulation/Gait Ambulation/Gait assistance: Min  assist;+2 safety/equipment Gait Distance (Feet): 3 Feet Assistive device: Rolling walker (2 wheeled) Gait Pattern/deviations: Step-to pattern;Decreased step length - right;Decreased step length - left;Shuffle Gait velocity: slowed Gait velocity interpretation: <1.31 ft/sec, indicative of household ambulator General Gait Details: minAx2 for steadying with slow, shuffling steps from [facility] to recliner         Balance Overall balance assessment: Needs assistance Sitting-balance support: Feet supported Sitting balance-Leahy Scale: Fair     Standing balance support: Bilateral upper extremity supported;Single extremity supported;During functional activity Standing balance-Leahy Scale: Poor                               Pertinent Vitals/Pain Pain Assessment: No/denies pain    Home Living Family/patient expects to be discharged to:: Private residence Living Arrangements: Spouse/significant other;Other relatives Available Help at Discharge: Family;Available PRN/intermittently Type of Home: House Home Access: Level entry     Home Layout: One level Home Equipment: Walker - 2 wheels;Cane - single point      Prior Function Level of Independence: Independent with assistive device(s);Needs assistance   Gait / Transfers Assistance Needed: pt reports that she doesn't use her cane or RW, but reaches out for wall sometimes when ambulating at home  ADL's / Homemaking Assistance Needed: Pt reports that she was independent with ADLs/selfcare, home mgt and was driving        Hand Dominance   Dominant Hand: Right    Extremity/Trunk Assessment   Upper Extremity Assessment Upper Extremity Assessment: Defer to OT evaluation LUE Deficits / Details: L hand edematous LUE  Sensation: decreased light touch    Lower Extremity Assessment Lower Extremity Assessment: Generalized weakness;RLE deficits/detail;LLE deficits/detail RLE Deficits / Details: ROM WFL, strength grossly  assessed at 3+/5 LLE Deficits / Details: ROM WFL, strength grossly assessed at 3+/5       Communication   Communication: No difficulties  Cognition Arousal/Alertness: Awake/alert Behavior During Therapy: WFL for tasks assessed/performed Overall Cognitive Status: Within Functional Limits for tasks assessed                                        General Comments General comments (skin integrity, edema, etc.): BP supine 89/47 with sitting 120/50 unable to get standing BP, with sitting in recliner 87/75, Pt on 4L O2 via [place] SaO2 >90%O2 throughout session , HR max with transfer 118bpm        Assessment/Plan    PT Assessment Patient needs continued PT services  PT Problem List Decreased strength;Decreased range of motion       PT Treatment Interventions DME instruction;Gait training;Functional mobility training;Therapeutic activities;Therapeutic exercise;Balance training;Neuromuscular re-education;Cognitive remediation;Patient/family education    PT Goals (Current goals can be found in the Care Plan section)  Acute Rehab PT Goals Patient Stated Goal: get better PT Goal Formulation: With patient Time For Goal Achievement: 01/14/19 Potential to Achieve Goals: Fair    Frequency Min 2X/week        Co-evaluation   Reason for Co-Treatment: For patient/therapist safety;To address functional/ADL transfers   OT goals addressed during session: ADL's and self-care;Proper use of Adaptive equipment and DME       AM-PAC PT "6 Clicks" Mobility  Outcome Measure Help needed turning from your back to your side while in a flat bed without using bedrails?: A Little Help needed moving from lying on your back to sitting on the side of a flat bed without using bedrails?: A Little Help needed moving to and from a bed to a chair (including a wheelchair)?: A Lot Help needed standing up from a chair using your arms (e.g., wheelchair or bedside chair)?: A Lot Help needed to walk in  hospital room?: A Little Help needed climbing 3-5 steps with a railing? : A Lot 6 Click Score: 15    End of Session Equipment Utilized During Treatment: Gait belt Activity Tolerance: Patient limited by fatigue;Patient tolerated treatment well Patient left: in chair;with call bell/phone within reach;with family/visitor present Nurse Communication: Mobility status PT Visit Diagnosis: Unsteadiness on feet (R26.81);Other abnormalities of gait and mobility (R26.89);Repeated falls (R29.6);Muscle weakness (generalized) (M62.81);History of falling (Z91.81);Difficulty in walking, not elsewhere classified (R26.2)    Time: 4742-5956 PT Time Calculation (min) (ACUTE ONLY): 35 min   Charges:   PT Evaluation $PT Eval Moderate Complexity: 1 Mod          Maudie Shingledecker B. Migdalia Dk PT, DPT Acute Rehabilitation Services Pager [phone] Office [phone]   [place] 12/31/2018, 1:38 PM

## 2018-12-31 NOTE — Progress Notes (Signed)
PROGRESS NOTE  Brittany Porter YPP:[id number] DOB: [birth year]   PCP: [name], MD  Patient is from: Home.  Recently started using walker  DOA: 12/28/2018 LOS: 3  Brief Narrative / Interim history: 73 year old female with history of bilateral breast cancer/bilateral mastectomy with bone, liver and lung mets currently on Xeloda, ovarian and endometrial cancer, IDDM-2, HTN, A. fib on Xarelto, chronic respiratory failure on 2 to 4 L, and chronic pain on occasional NSAIDs presenting with generalized weakness, DOE and palpitation, and admitted for severe symptomatic anemia with hemoglobin of 2.7.  Denies melena or hematochezia. In ED: Patient noted to have mild temp to 99.2.  BP 84/47.  Pulse ox 97.  RR 30.  100% on 4 L.  Hgb 2.6.  WBC 7.6.  Platelet 185.  INR 3.1.  Creatinine 1.07.  BUN 31.  Glucose 259.  Bicarb 15.  AG 16.  LA 7.0.  CXR with stable small to moderate sized left pleural effusion, bibasilar atelectasis versus pneumonia.  Admitted for symptomatic anemia.  4 units of blood ordered.  Also started on broad-spectrum antibiotics for possible pneumonia.  Subjective: No acute issues or events overnight, patient feels quite well, somewhat weak but generally improving from previous.  Patient denies any nausea, vomiting, diarrhea, constipation, headache, fevers, chills, melena, hematochezia. . Assessment & Plan:  Principal Problem:   Symptomatic anemia Active Problems:   Ovarian cancer (HCC)   Uterine carcinoma (HCC)   DM2 (diabetes mellitus, type 2) (HCC)   Bilateral breast cancer (HCC)   Atrial fibrillation and flutter (HCC)   Lactic acidosis   Healthcare-associated pneumonia   Severe symptomatic anemia, POA, likely in the setting of GI bleed, resolving:  Admitted on Xarelto and occasional NSAID.  FOBT positive.   She is also on Xeloda which could contribute.   No signs of hemolysis. -Hgb 10 (and 6/20)>> 2.6 (admit)> 3 units> 9.1> 8.1>7.2 -Oncology, Dr. Providence Lanius,  exclude GIB and outpatient follow-up if Hgb stable -EGD on 8/16-Negative -Monitor H&H. Xarelto ok to DC per cardiolog.  Continue PPI.  Clear liquid diet. -Counseled on NSAIDs. -GI signing off - can re-consult if H/H continues downtrending for possible colonoscopy to further evaluate; consider pill endoscopy as well.  Acute diastolic CHF exacerbation: Echo with EF of 55 to 60%,  Exacerbation likely due to severe anemia, IV fluid resuscitation, blood transfusion and NSAID use.  Clinically appears hypervolemic - I/O continue to show net positive fluid balance -Continue IV Lasix 40 mg daily -Daily weight, intake output and renal functions -Salt and fluid restriction  Abnormal chest x-ray:  Portable CXR revealed stable small to moderate left pleural effusion with left retrocardiac atelectasis versus infiltrate.   I suspect this to be due to cancer, anemia and CHF versus infectious process.  Cultures negative. -We will narrow antibiotics to ceftriaxone to complete 3 days course on 12/31/18  History of bilateral breast cancer/bilateral mastectomy with metastasis to liver, bone and lung.  -Followed by Dr. Lindi Adie -Discussed with Dr. Alen Blew who recommended holding Xeloda in the setting of severe anemia.  Mild DKA in patient with IDDM-2/hyperglycemia:  -Acidosis resolved which could be also due to LA.  -Continue current regimen and CBG monitoring -Check A1c: 6.0  Lactic acidosis, resolved, POA:  Likely due to #1 versus infectious process.  Resolved.  Paroxysmal atrial fibrillation:  Currently rate and rhythm controlled.   Continue amiodarone 200 mg daily (home dose previously BID)  Chad-2-Vasc score 6 (annual stroke or major VTE risk of 11%). HAS-BLED-3 (5.9% and  4.1% risk of major bleeding with Xarelto and Eliquis respectively).  -Discussed with cardiology, Dr. Lovena Le who recommended no further anticoagulation given above  DVT prophylaxis: SCD Code Status: Full code-confirmed with patient  and patient's daughter Disposition Plan: Remains inpatient Consultants: Gastroenterology and oncology  Objective: Vitals:   12/30/18 1943 12/30/18 2035 12/31/18 0500 12/31/18 0737  BP:  (!) 154/66 (!) 107/46   Pulse:  (!) 108 83   Resp:  (!) 35 (!) 21   Temp:  98.1 F (36.7 C) 98.2 F (36.8 C)   TempSrc:  Oral Axillary   SpO2: 96% 97% 100% 97%  Weight:   86 kg   Height:        Intake/Output Summary (Last 24 hours) at 12/31/2018 0758 Last data filed at 12/31/2018 0500 Gross per 24 hour  Intake 843 ml  Output 1850 ml  Net -1007 ml   Filed Weights   12/29/18 1625 12/30/18 0456 12/31/18 0500  Weight: 86.4 kg 85.3 kg 86 kg    Examination: GENERAL: No acute distress.  Appears well.  HEENT: MMM.  Vision and hearing grossly intact.  Sclera anicteric. NECK: Supple.  No apparent JVD.  RESP:  No IWOB. Good air movement bilaterally. CVS:  RRR. Heart sounds normal.  ABD/GI/GU: Bowel sounds present. Soft. Non tender.  MSK/EXT:  Moves extremities.  Trace edema bilaterally in upper and lower extremities. SKIN: no apparent skin lesion or wound NEURO: Awake, alert and oriented appropriately.  No gross deficit.  PSYCH: Calm. Normal affect.   I have personally reviewed the following labs and images:  Radiology Studies: No results found.  Microbiology: Recent Results (from the past 240 hour(s))  Culture, blood (Routine x 2)     Status: None (Preliminary result)   Collection Time: 12/28/18  7:18 AM   Specimen: BLOOD RIGHT HAND  Result Value Ref Range Status   Specimen Description BLOOD RIGHT HAND  Final   Special Requests   Final    BOTTLES DRAWN AEROBIC AND ANAEROBIC Blood Culture adequate volume   Culture   Final    NO GROWTH 2 DAYS Performed at [facility], [address] [place], [place] [address]    Report Status PENDING  Incomplete  Culture, blood (Routine x 2)     Status: None (Preliminary result)   Collection Time: 12/28/18  7:25 AM   Specimen: BLOOD  Result  Value Ref Range Status   Specimen Description BLOOD RIGHT ANTECUBITAL  Final   Special Requests   Final    BOTTLES DRAWN AEROBIC AND ANAEROBIC Blood Culture adequate volume   Culture   Final    NO GROWTH 2 DAYS Performed at [facility], [place] [address] [place], [place] [address]    Report Status PENDING  Incomplete  Urine culture     Status: None   Collection Time: 12/28/18  9:48 PM   Specimen: In/Out Cath Urine  Result Value Ref Range Status   Specimen Description IN/OUT CATH URINE  Final   Special Requests NONE  Final   Culture   Final    NO GROWTH Performed at [facility], [place] [address] [place],  [address]    Report Status 12/30/2018 FINAL  Final  SARS Coronavirus 2 [facility] order, Performed in [facility] hospital lab) Nasopharyngeal Nasopharyngeal Swab     Status: None   Collection Time: 12/28/18  9:48 PM   Specimen: Nasopharyngeal Swab  Result Value Ref Range Status   SARS Coronavirus 2 NEGATIVE  NEGATIVE Final    Comment: (NOTE) If result is NEGATIVE SARS-CoV-2 target nucleic acids are NOT DETECTED. The SARS-CoV-2 RNA is generally detectable in upper and lower  respiratory specimens during the acute phase of infection. The lowest  concentration of SARS-CoV-2 viral copies this assay can detect is 250  copies / mL. A negative result does not preclude SARS-CoV-2 infection  and should not be used as the sole basis for treatment or other  patient management decisions.  A negative result may occur with  improper specimen collection / handling, submission of specimen other  than nasopharyngeal swab, presence of viral mutation(s) within the  areas targeted by this assay, and inadequate number of viral copies  (<250 copies / mL). A negative result must be combined with clinical  observations, patient history, and epidemiological information. If result is POSITIVE SARS-CoV-2 target nucleic acids are DETECTED. The SARS-CoV-2 RNA is generally detectable in  upper and lower  respiratory specimens dur ing the acute phase of infection.  Positive  results are indicative of active infection with SARS-CoV-2.  Clinical  correlation with patient history and other diagnostic information is  necessary to determine patient infection status.  Positive results do  not rule out bacterial infection or co-infection with other viruses. If result is PRESUMPTIVE POSTIVE SARS-CoV-2 nucleic acids MAY BE PRESENT.   A presumptive positive result was obtained on the submitted specimen  and confirmed on repeat testing.  While 2019 novel coronavirus  (SARS-CoV-2) nucleic acids may be present in the submitted sample  additional confirmatory testing may be necessary for epidemiological  and / or clinical management purposes  to differentiate between  SARS-CoV-2 and other Sarbecovirus currently known to infect humans.  If clinically indicated additional testing with an alternate test  methodology [phone]) is advised. The SARS-CoV-2 RNA is generally  detectable in upper and lower respiratory sp ecimens during the acute  phase of infection. The expected result is Negative. Fact Sheet for Patients:  StrictlyIdeas.no Fact Sheet for Healthcare Providers: BankingDealers.co.za This test is not yet approved or cleared by the [place] FDA and has been authorized for detection and/or diagnosis of SARS-CoV-2 by FDA under an Emergency Use Authorization (EUA).  This EUA will remain in effect (meaning this test can be used) for the duration of the COVID-19 declaration under Section 564(b)(1) of the Act, 21 U.S.C. section 360bbb-3(b)(1), unless the authorization is terminated or revoked sooner. Performed at [name], [place] [address], [place], [place] [address]     Urine analysis:    Component Value Date/Time   COLORURINE YELLOW 12/28/2018 2148   APPEARANCEUR CLEAR 12/28/2018 2148   LABSPEC 1.015 12/28/2018 2148    PHURINE 5.0 12/28/2018 2148   GLUCOSEU 50 (A) 12/28/2018 2148   HGBUR NEGATIVE 12/28/2018 2148   BILIRUBINUR NEGATIVE 12/28/2018 2148   KETONESUR 5 (A) 12/28/2018 2148   PROTEINUR NEGATIVE 12/28/2018 2148   UROBILINOGEN 0.2 05/15/2012 0221   NITRITE NEGATIVE 12/28/2018 2148   LEUKOCYTESUR NEGATIVE 12/28/2018 2148    Anemia Panel: Recent Labs    12/29/18 1353  VITAMINB12 158*  TIBC 291  IRON 68    CBG: Recent Labs  Lab 12/29/18 2123 12/30/18 0746 12/30/18 1149 12/30/18 1700 12/30/18 2141  GLUCAP 172* 141* 138* 170* 209*    HbA1C: Recent Labs    12/31/18 0429  HGBA1C 6.0*    BNP (last 3 results): No results for input(s): PROBNP in the last 8760 hours.  Cardiac Enzymes: Recent Labs  Lab 12/30/18 [id number]  CKTOTAL 140    Coagulation Profile: Recent Labs  Lab 12/28/18 2140 12/30/18 0658 12/31/18 0429  INR 3.1* 1.5* 1.4*    Liver Function Tests: Recent Labs  Lab 12/28/18 1826 12/29/18 1353 12/30/18 0658 12/31/18 0429  AST 35 39  --  28  ALT 11 14  --  12  ALKPHOS 52 59  --  53  BILITOT 1.0 0.9  --  0.8  PROT 4.0* 4.5*  --  3.7*  ALBUMIN 2.2* 2.5* 2.1* 1.9*   No results for input(s): LIPASE, AMYLASE in the last 168 hours. No results for input(s): AMMONIA in the last 168 hours.  Basic Metabolic Panel: Recent Labs  Lab 12/28/18 1826 12/29/18 1353 12/30/18 0658 12/31/18 0429  NA 141 144 143 141  K 4.2 3.0* 4.3 3.4*  CL 110 109 111 106  CO2 15* 23 24 27   GLUCOSE 259* 204* 150* 129*  BUN 31* 29* 26* 19  CREATININE 1.07* 1.08* 1.05* 0.91  CALCIUM 6.5* 6.6* 6.4* 6.6*   GFR: Estimated Creatinine Clearance: 56.2 mL/min (by C-G formula based on SCr of 0.91 mg/dL).  CBC: Recent Labs  Lab 12/29/18 1352  12/29/18 1843 12/29/18 2301 12/30/18 0129 12/30/18 0658 12/30/18 1345 12/31/18 0429  WBC 7.1  --  7.5  --  6.2 5.8 6.1 5.0  NEUTROABS 6.4  --  5.9  --  4.6 4.5 4.7  --   HGB 9.5*   < > 9.1* 7.8* 7.8* 8.1* 8.0* 7.2*  HCT 28.1*   < >  26.8* 22.6* 22.7* 24.0* 24.0* 22.2*  MCV 88.9  --  88.4  --  87.6 89.9 89.9 93.3  PLT 138*  --  128*  --  125* 131* 118* 92*   < > = values in this interval not displayed.    Procedures:  8/16-EGD negative  Microbiology summarized: SARS-CoV-2 negative. Blood cultures pending    Antimicrobials: Anti-infectives (From admission, onward)   Start     Dose/Rate Route Frequency Ordered Stop   12/29/18 2000  vancomycin (VANCOCIN) IVPB 750 mg/150 ml premix  Status:  Discontinued     750 mg 150 mL/hr over 60 Minutes Intravenous Every 24 hours 12/28/18 1952 12/29/18 1101   12/29/18 1115  cefTRIAXone (ROCEPHIN) 2 g in sodium chloride 0.9 % 100 mL IVPB     2 g 200 mL/hr over 30 Minutes Intravenous Every 24 hours 12/29/18 1102     12/29/18 0800  ceFEPIme (MAXIPIME) 2 g in sodium chloride 0.9 % 100 mL IVPB  Status:  Discontinued     2 g 200 mL/hr over 30 Minutes Intravenous Every 12 hours 12/28/18 1952 12/29/18 1101   12/28/18 1915  vancomycin (VANCOCIN) 1,500 mg in sodium chloride 0.9 % 500 mL IVPB     1,500 mg 250 mL/hr over 120 Minutes Intravenous  Once 12/28/18 1904 12/29/18 0116   12/28/18 1900  ceFEPIme (MAXIPIME) 2 g in sodium chloride 0.9 % 100 mL IVPB     2 g 200 mL/hr over 30 Minutes Intravenous  Once 12/28/18 1847 12/28/18 2007   12/28/18 1900  metroNIDAZOLE (FLAGYL) IVPB 500 mg     500 mg 100 mL/hr over 60 Minutes Intravenous  Once 12/28/18 1847 12/28/18 2118   12/28/18 1900  vancomycin (VANCOCIN) IVPB 1000 mg/200 mL premix  Status:  Discontinued     1,000 mg 200 mL/hr over 60 Minutes Intravenous  Once 12/28/18 1847 12/28/18 1904      Sch Meds:  Scheduled Meds: . amiodarone  200 mg  Oral Daily  . atorvastatin  20 mg Oral q1800  . calcium-vitamin D  1 tablet Oral BID WC  . cholecalciferol  2,000 Units Oral Daily  . furosemide  40 mg Intravenous Daily  . gabapentin  100 mg Oral BID  . insulin aspart  0-5 Units Subcutaneous QHS  . insulin aspart  0-9 Units Subcutaneous TID  WC  . ipratropium-albuterol  3 mL Inhalation TID  . magnesium oxide  200 mg Oral QHS  . metoprolol succinate  25 mg Oral Daily  . omega-3 acid ethyl esters  1 g Oral BID  . pantoprazole (PROTONIX) IV  40 mg Intravenous Q12H  . polyethylene glycol  17 g Oral BID  . sodium chloride flush  3 mL Intravenous Q12H   Continuous Infusions: . sodium chloride    . sodium chloride    . cefTRIAXone (ROCEPHIN)  IV 2 g (12/30/18 1222)   PRN Meds:.sodium chloride, ferrous sulfate, ipratropium-albuterol, ondansetron, prochlorperazine, sodium chloride, sodium chloride flush  35 minutes with more than 50% spent in reviewing records, counseling patient and coordinating care.  Holli Humbles III DO  [facility]  If 7PM-7AM, please contact night-coverage [email or web address] Password [facility] 12/31/2018, 7:58 AM

## 2019-01-01 ENCOUNTER — Inpatient Hospital Stay: Payer: Medicare Other

## 2019-01-01 ENCOUNTER — Encounter (HOSPITAL_COMMUNITY): Payer: Self-pay | Admitting: Gastroenterology

## 2019-01-01 ENCOUNTER — Inpatient Hospital Stay: Payer: Medicare Other | Admitting: Hematology and Oncology

## 2019-01-01 LAB — HEMOGLOBIN AND HEMATOCRIT, BLOOD
HCT: 28.5 % — ABNORMAL LOW (ref 36.0–46.0)
Hemoglobin: 9.2 g/dL — ABNORMAL LOW (ref 12.0–15.0)

## 2019-01-01 LAB — CBC
HCT: 22.2 % — ABNORMAL LOW (ref 36.0–46.0)
Hemoglobin: 7 g/dL — ABNORMAL LOW (ref 12.0–15.0)
MCH: 29.9 pg (ref 26.0–34.0)
MCHC: 31.5 g/dL (ref 30.0–36.0)
MCV: 94.9 fL (ref 80.0–100.0)
Platelets: 76 10*3/uL — ABNORMAL LOW (ref 150–400)
RBC: 2.34 MIL/uL — ABNORMAL LOW (ref 3.87–5.11)
RDW: 20.7 % — ABNORMAL HIGH (ref 11.5–15.5)
WBC: 4.1 10*3/uL (ref 4.0–10.5)
nRBC: 0.7 % — ABNORMAL HIGH (ref 0.0–0.2)

## 2019-01-01 LAB — PROTIME-INR
INR: 1.2 (ref 0.8–1.2)
Prothrombin Time: 15.5 seconds — ABNORMAL HIGH (ref 11.4–15.2)

## 2019-01-01 LAB — GLUCOSE, CAPILLARY
Glucose-Capillary: 148 mg/dL — ABNORMAL HIGH (ref 70–99)
Glucose-Capillary: 310 mg/dL — ABNORMAL HIGH (ref 70–99)
Glucose-Capillary: 250 mg/dL — ABNORMAL HIGH (ref 70–99)
Glucose-Capillary: 256 mg/dL — ABNORMAL HIGH (ref 70–99)

## 2019-01-01 LAB — PREPARE RBC (CROSSMATCH)

## 2019-01-01 LAB — PATHOLOGIST SMEAR REVIEW

## 2019-01-01 MED ORDER — SODIUM CHLORIDE 0.9% IV SOLUTION: Freq: Once | INTRAVENOUS | Status: DC

## 2019-01-01 NOTE — Progress Notes (Signed)
PROGRESS NOTE  Brittany Porter YKD:[id number] DOB: 1946-03-31   PCP: [name], MD  Patient is from: Home.  Recently started using walker  DOA: 12/28/2018 LOS: 4  Brief Narrative / Interim history: 73 year old female with history of bilateral breast cancer/bilateral mastectomy with bone, liver and lung mets currently on Xeloda, ovarian and endometrial cancer, IDDM-2, HTN, A. fib on Xarelto, chronic respiratory failure on 2 to 4 L, and chronic pain on occasional NSAIDs presenting with generalized weakness, DOE and palpitation, and admitted for severe symptomatic anemia with hemoglobin of 2.7.  Denies melena or hematochezia. In ED: Patient noted to have mild temp to 99.2.  BP 84/47.  Pulse ox 97.  RR 30.  100% on 4 L.  Hgb 2.6.  WBC 7.6.  Platelet 185.  INR 3.1.  Creatinine 1.07.  BUN 31.  Glucose 259.  Bicarb 15.  AG 16.  LA 7.0.  CXR with stable small to moderate sized left pleural effusion, bibasilar atelectasis versus pneumonia.  Admitted for symptomatic anemia.  4 units of blood ordered.  Also started on broad-spectrum antibiotics for possible pneumonia.  Subjective: Patient complaining of red/maroon and dark stools overnight, patient feels quite well, somewhat weak but generally improving from previous.  Patient denies any nausea, vomiting, diarrhea, constipation, headache, fevers, chills.  Assessment & Plan:  Principal Problem:   Symptomatic anemia Active Problems:   Ovarian cancer (HCC)   Uterine carcinoma (HCC)   DM2 (diabetes mellitus, type 2) (HCC)   Bilateral breast cancer (HCC)   Atrial fibrillation and flutter (HCC)   Lactic acidosis   Healthcare-associated pneumonia   Severe symptomatic anemia, POA, likely in the setting of GI bleed, resolving:  Admitted on Xarelto and occasional NSAID - now discontinued.  FOBT positive - continues to remark on red/maroon stool She is also on Xeloda which could contribute.   No signs of hemolysis. -Hgb 10 (and 6/20)>> 2.6 (admit)> 3  units> 9.1> 8.1>7.2>7.0+(1u PRBC on 18th) -Oncology, Dr. Providence Lanius -EGD on 8/16-Negative -Monitor H&H. Xarelto ok to DC per cardiolog.  Continue PPI.  Clear liquid diet. -Counseled on NSAIDs. -GI asked to re-evaluate - appreciate insight/recs. If H/H continue to fall patient may require repeat evaluation/colonscopy inpatient.  Acute diastolic CHF exacerbation: Echo with EF of 55 to 60%, improving Exacerbation likely due to severe anemia, IV fluid resuscitation, blood transfusion and NSAID use.  Clinically appears more euvolemic today - I/O continue to show net positive fluid balance since admission -Continue IV Lasix 40 mg daily -Daily weight, intake output and renal functions -Salt and fluid restriction  Abnormal chest x-ray:  Portable CXR revealed stable small to moderate left pleural effusion with left retrocardiac atelectasis versus infiltrate.   I suspect this to be due to cancer, anemia and CHF versus infectious process.  Cultures negative. -We will narrow antibiotics to ceftriaxone to complete 5 days course on 01/02/19  Acute on chronic hypoxic respiratory failure, POA -Unclear baseline - per outpatient pulm note "Recommend 4L with exertion and sleep" but was otherwise not on oxygen at rest - Patient now oxygen dependent even at rest - considering 2/2 to HF exacerbation as above, symptomatic anemia with poor carrying capacity, or worsening metastatic breast cancer.  History of bilateral breast cancer/bilateral mastectomy with metastasis to liver, bone and lung.  -Followed by Dr. Lindi Adie -Discussed with Dr. Alen Blew who recommended holding Xeloda in the setting of severe anemia.  Mild DKA in patient with IDDM-2/hyperglycemia:  -Acidosis resolved which could be also due to LA.  -  Continue current regimen and CBG monitoring -Check A1c: 6.0  Lactic acidosis, resolved, POA:  Likely due to #1 versus infectious process.  Resolved.  Paroxysmal atrial fibrillation:  Currently  rate and rhythm controlled.   Continue amiodarone 200 mg daily (home dose previously BID)  Chad-2-Vasc score 6 (annual stroke or major VTE risk of 11%). HAS-BLED-3 (5.9% and 4.1% risk of major bleeding with Xarelto and Eliquis respectively).  -Discussed with cardiology, Dr. Lovena Le who recommended no further anticoagulation given above  DVT prophylaxis: SCD Code Status: Full code-confirmed with patient and patient's daughter Disposition Plan: Remains inpatient; pending H/H may require further evaluation/procedure with GI or possible repeat transfusion. Once H/H remains stable consider DC pending symptoms as above. Consultants: Gastroenterology and oncology  Objective: Vitals:   12/31/18 1513 12/31/18 1950 12/31/18 2025 01/01/19 0605  BP: 106/71  (!) 95/48 (!) 100/47  Pulse: 80  85 82  Resp:   19 18  Temp: 99 F (37.2 C)  98.2 F (36.8 C) 98 F (36.7 C)  TempSrc: Oral  Oral Oral  SpO2:  96% 100%   Weight:    85.1 kg  Height:        Intake/Output Summary (Last 24 hours) at 01/01/2019 0746 Last data filed at 12/31/2018 2100 Gross per 24 hour  Intake 360 ml  Output -  Net 360 ml   Filed Weights   12/30/18 0456 12/31/18 0500 01/01/19 0605  Weight: 85.3 kg 86 kg 85.1 kg    Examination: GENERAL: No acute distress.  Appears well.  HEENT: MMM.  Vision and hearing grossly intact.  Sclera anicteric. NECK: Supple.  No apparent JVD.  RESP:  No IWOB. Good air movement bilaterally. CVS:  RRR. Heart sounds normal.  ABD/GI/GU: Bowel sounds present. Soft. Non tender.  MSK/EXT:  Moves extremities.  Trace edema bilaterally in upper and lower extremities. SKIN: no apparent skin lesion or wound NEURO: Awake, alert and oriented appropriately.  No gross deficit.  PSYCH: Calm. Normal affect.   I have personally reviewed the following labs and images:  Radiology Studies: No results found.  Microbiology: Recent Results (from the past 240 hour(s))  Culture, blood (Routine x 2)     Status:  None (Preliminary result)   Collection Time: 12/28/18  7:18 AM   Specimen: BLOOD RIGHT HAND  Result Value Ref Range Status   Specimen Description BLOOD RIGHT HAND  Final   Special Requests   Final    BOTTLES DRAWN AEROBIC AND ANAEROBIC Blood Culture adequate volume   Culture   Final    NO GROWTH 3 DAYS Performed at [facility], [address] [place], [place] [address]    Report Status PENDING  Incomplete  Culture, blood (Routine x 2)     Status: None (Preliminary result)   Collection Time: 12/28/18  7:25 AM   Specimen: BLOOD  Result Value Ref Range Status   Specimen Description BLOOD RIGHT ANTECUBITAL  Final   Special Requests   Final    BOTTLES DRAWN AEROBIC AND ANAEROBIC Blood Culture adequate volume   Culture   Final    NO GROWTH 3 DAYS Performed at [facility], [place] [address] [place], [place] [address]    Report Status PENDING  Incomplete  Urine culture     Status: None   Collection Time: 12/28/18  9:48 PM   Specimen: In/Out Cath Urine  Result Value Ref Range Status   Specimen Description IN/OUT CATH URINE  Final   Special Requests NONE  Final   Culture   Final    NO GROWTH Performed at [facility], [place] [address] [place], [place] [address]    Report Status 12/30/2018 FINAL  Final  SARS Coronavirus 2 [facility] order, Performed in [facility] hospital lab) Nasopharyngeal Nasopharyngeal Swab     Status: None   Collection Time: 12/28/18  9:48 PM   Specimen: Nasopharyngeal Swab  Result Value Ref Range Status   SARS Coronavirus 2 NEGATIVE NEGATIVE Final    Comment: (NOTE) If result is NEGATIVE SARS-CoV-2 target nucleic acids are NOT DETECTED. The SARS-CoV-2 RNA is generally detectable in upper and lower  respiratory specimens during the acute phase of infection. The lowest  concentration of SARS-CoV-2 viral copies this assay can detect is 250  copies / mL. A negative result does not preclude SARS-CoV-2 infection  and should not be used as the  sole basis for treatment or other  patient management decisions.  A negative result may occur with  improper specimen collection / handling, submission of specimen other  than nasopharyngeal swab, presence of viral mutation(s) within the  areas targeted by this assay, and inadequate number of viral copies  (<250 copies / mL). A negative result must be combined with clinical  observations, patient history, and epidemiological information. If result is POSITIVE SARS-CoV-2 target nucleic acids are DETECTED. The SARS-CoV-2 RNA is generally detectable in upper and lower  respiratory specimens dur ing the acute phase of infection.  Positive  results are indicative of active infection with SARS-CoV-2.  Clinical  correlation with patient history and other diagnostic information is  necessary to determine patient infection status.  Positive results do  not rule out bacterial infection or co-infection with other viruses. If result is PRESUMPTIVE POSTIVE SARS-CoV-2 nucleic acids MAY BE PRESENT.   A presumptive positive result was obtained on the submitted specimen  and confirmed on repeat testing.  While 2019 novel coronavirus  (SARS-CoV-2) nucleic acids may be present in the submitted sample  additional confirmatory testing may be necessary for epidemiological  and / or clinical management purposes  to differentiate between  SARS-CoV-2 and other Sarbecovirus currently known to infect humans.  If clinically indicated additional testing with an alternate test  methodology [phone]) is advised. The SARS-CoV-2 RNA is generally  detectable in upper and lower respiratory sp ecimens during the acute  phase of infection. The expected result is Negative. Fact Sheet for Patients:  StrictlyIdeas.no Fact Sheet for Healthcare Providers: BankingDealers.co.za This test is not yet approved or cleared by the [place] FDA and has been authorized for detection  and/or diagnosis of SARS-CoV-2 by FDA under an Emergency Use Authorization (EUA).  This EUA will remain in effect (meaning this test can be used) for the duration of the COVID-19 declaration under Section 564(b)(1) of the Act, 21 U.S.C. section 360bbb-3(b)(1), unless the authorization is terminated or revoked sooner. Performed at [facility], [place] [address] [place], [place] [address]     Urine analysis:    Component Value Date/Time   COLORURINE YELLOW 12/28/2018 2148   APPEARANCEUR CLEAR 12/28/2018 2148   LABSPEC 1.015 12/28/2018 2148   PHURINE 5.0 12/28/2018 2148   GLUCOSEU 50 (A) 12/28/2018 2148   HGBUR NEGATIVE 12/28/2018 2148   BILIRUBINUR NEGATIVE 12/28/2018 2148   KETONESUR 5 (A) 12/28/2018 2148   PROTEINUR NEGATIVE 12/28/2018 2148   UROBILINOGEN 0.2 05/15/2012 0221   NITRITE NEGATIVE 12/28/2018 2148   LEUKOCYTESUR NEGATIVE 12/28/2018 2148    Anemia Panel: Recent Labs  12/29/18 1353  VITAMINB12 158*  TIBC 291  IRON 68    CBG: Recent Labs  Lab 12/30/18 2141 12/31/18 0807 12/31/18 1139 12/31/18 1643 12/31/18 2130  GLUCAP 209* 123* 154* 105* 174*    HbA1C: Recent Labs    12/31/18 0429  HGBA1C 6.0*    BNP (last 3 results): No results for input(s): PROBNP in the last 8760 hours.  Cardiac Enzymes: Recent Labs  Lab 12/30/18 0658  CKTOTAL 140    Coagulation Profile: Recent Labs  Lab 12/28/18 2140 12/30/18 0658 12/31/18 0429 01/01/19 0500  INR 3.1* 1.5* 1.4* 1.2    Liver Function Tests: Recent Labs  Lab 12/28/18 1826 12/29/18 1353 12/30/18 0658 12/31/18 0429  AST 35 39  --  28  ALT 11 14  --  12  ALKPHOS 52 59  --  53  BILITOT 1.0 0.9  --  0.8  PROT 4.0* 4.5*  --  3.7*  ALBUMIN 2.2* 2.5* 2.1* 1.9*   No results for input(s): LIPASE, AMYLASE in the last 168 hours. No results for input(s): AMMONIA in the last 168 hours.  Basic Metabolic Panel: Recent Labs  Lab 12/28/18 1826 12/29/18 1353 12/30/18 0658 12/31/18 0429   NA 141 144 143 141  K 4.2 3.0* 4.3 3.4*  CL 110 109 111 106  CO2 15* 23 24 27   GLUCOSE 259* 204* 150* 129*  BUN 31* 29* 26* 19  CREATININE 1.07* 1.08* 1.05* 0.91  CALCIUM 6.5* 6.6* 6.4* 6.6*   GFR: Estimated Creatinine Clearance: 55.8 mL/min (by C-G formula based on SCr of 0.91 mg/dL).  CBC: Recent Labs  Lab 12/29/18 1352  12/29/18 1843  12/30/18 0129 12/30/18 0658 12/30/18 1345 12/31/18 0429 01/01/19 0500  WBC 7.1  --  7.5  --  6.2 5.8 6.1 5.0 4.1  NEUTROABS 6.4  --  5.9  --  4.6 4.5 4.7  --   --   HGB 9.5*   < > 9.1*   < > 7.8* 8.1* 8.0* 7.2* 7.0*  HCT 28.1*   < > 26.8*   < > 22.7* 24.0* 24.0* 22.2* 22.2*  MCV 88.9  --  88.4  --  87.6 89.9 89.9 93.3 94.9  PLT 138*  --  128*  --  125* 131* 118* 92* 76*   < > = values in this interval not displayed.    Procedures:  8/16-EGD negative  Microbiology summarized: SARS-CoV-2 negative. Blood cultures pending    Antimicrobials: Anti-infectives (From admission, onward)   Start     Dose/Rate Route Frequency Ordered Stop   12/29/18 2000  vancomycin (VANCOCIN) IVPB 750 mg/150 ml premix  Status:  Discontinued     750 mg 150 mL/hr over 60 Minutes Intravenous Every 24 hours 12/28/18 1952 12/29/18 1101   12/29/18 1115  cefTRIAXone (ROCEPHIN) 2 g in sodium chloride 0.9 % 100 mL IVPB     2 g 200 mL/hr over 30 Minutes Intravenous Every 24 hours 12/29/18 1102     12/29/18 0800  ceFEPIme (MAXIPIME) 2 g in sodium chloride 0.9 % 100 mL IVPB  Status:  Discontinued     2 g 200 mL/hr over 30 Minutes Intravenous Every 12 hours 12/28/18 1952 12/29/18 1101   12/28/18 1915  vancomycin (VANCOCIN) 1,500 mg in sodium chloride 0.9 % 500 mL IVPB     1,500 mg 250 mL/hr over 120 Minutes Intravenous  Once 12/28/18 1904 12/29/18 0116   12/28/18 1900  ceFEPIme (MAXIPIME) 2 g in sodium chloride 0.9 % 100 mL IVPB  2 g 200 mL/hr over 30 Minutes Intravenous  Once 12/28/18 1847 12/28/18 2007   12/28/18 1900  metroNIDAZOLE (FLAGYL) IVPB 500 mg      500 mg 100 mL/hr over 60 Minutes Intravenous  Once 12/28/18 1847 12/28/18 2118   12/28/18 1900  vancomycin (VANCOCIN) IVPB 1000 mg/200 mL premix  Status:  Discontinued     1,000 mg 200 mL/hr over 60 Minutes Intravenous  Once 12/28/18 1847 12/28/18 1904      Sch Meds:  Scheduled Meds: . amiodarone  200 mg Oral Daily  . atorvastatin  20 mg Oral q1800  . calcium-vitamin D  1 tablet Oral BID WC  . cholecalciferol  2,000 Units Oral Daily  . furosemide  40 mg Intravenous Daily  . gabapentin  100 mg Oral BID  . insulin aspart  0-5 Units Subcutaneous QHS  . insulin aspart  0-9 Units Subcutaneous TID WC  . ipratropium-albuterol  3 mL Inhalation TID  . magnesium oxide  200 mg Oral QHS  . metoprolol succinate  25 mg Oral Daily  . omega-3 acid ethyl esters  1 g Oral BID  . pantoprazole (PROTONIX) IV  40 mg Intravenous Q12H  . polyethylene glycol  17 g Oral BID  . sodium chloride flush  3 mL Intravenous Q12H   Continuous Infusions: . sodium chloride    . sodium chloride 250 mL (12/31/18 1339)  . cefTRIAXone (ROCEPHIN)  IV 2 g (12/31/18 1341)   PRN Meds:.sodium chloride, ferrous sulfate, ipratropium-albuterol, ondansetron, prochlorperazine, sodium chloride, sodium chloride flush  35 minutes with more than 50% spent in reviewing records, counseling patient and coordinating care.  Holli Humbles III DO  [facility]  If 7PM-7AM, please contact night-coverage [email or web address] Password [facility] 01/01/2019, 7:46 AM

## 2019-01-01 NOTE — Progress Notes (Signed)
Pt had small amount incontinent stool, dark with red coloring stain around BM on pad. Cont to monitor. Carroll Kinds RN

## 2019-01-01 NOTE — Progress Notes (Signed)
[facility] Gastroenterology Progress Note  Brittany Porter 73 y.o. 19-Jun-1945   Subjective: Small amount of dark stool with red staining this morning and denies other episodes. Sitting in bedside chair eating soft diet.  Objective: Vital signs: Vitals:   01/01/19 0605 01/01/19 0757  BP: (!) 100/47   Pulse: 82   Resp: 18   Temp: 98 F (36.7 C)   SpO2:  97%    Physical Exam: Gen: alert, no acute distress, elderly, frail HEENT: anicteric sclera CV: RRR Chest: CTA B Abd: soft, nontender, nondistended, +BS   Lab Results: Recent Labs    12/30/18 0658 12/31/18 0429  NA 143 141  K 4.3 3.4*  CL 111 106  CO2 24 27  GLUCOSE 150* 129*  BUN 26* 19  CREATININE 1.05* 0.91  CALCIUM 6.4* 6.6*   Recent Labs    12/29/18 1353 12/30/18 0658 12/31/18 0429  AST 39  --  28  ALT 14  --  12  ALKPHOS 59  --  53  BILITOT 0.9  --  0.8  PROT 4.5*  --  3.7*  ALBUMIN 2.5* 2.1* 1.9*   Recent Labs    12/30/18 0658 12/30/18 1345 12/31/18 0429 01/01/19 0500  WBC 5.8 6.1 5.0 4.1  NEUTROABS 4.5 4.7  --   --   HGB 8.1* 8.0* 7.2* 7.0*  HCT 24.0* 24.0* 22.2* 22.2*  MCV 89.9 89.9 93.3 94.9  PLT 131* 118* 92* 76*      Assessment/Plan: GI bleed - No significant decrease in Hgb over the last 2 days down to 7 from 8 on 12/30/18. I am not convinced that she is having ongoing GI bleeding and suspect the dark stool is due to residual blood. Suspect AVMs as source but if Hgb continues to fall then will need to do an updated inpt colonoscopy. Will f/u on Hgb tomorrow and if continues to fall then will change to clear liquid diet and do colon prep tomorrow. Need all stools documented in chart. Will f/u.   [name] 01/01/2019, 1:06 PM  Questions please call [phone] ID: [name], female   DOB: 07/24/1945, 73 y.o.   MRN: [id number]

## 2019-01-02 DIAGNOSIS — C55 Malignant neoplasm of uterus, part unspecified: Secondary | ICD-10-CM

## 2019-01-02 DIAGNOSIS — C50911 Malignant neoplasm of unspecified site of right female breast: Secondary | ICD-10-CM

## 2019-01-02 DIAGNOSIS — C50912 Malignant neoplasm of unspecified site of left female breast: Secondary | ICD-10-CM

## 2019-01-02 DIAGNOSIS — I4892 Unspecified atrial flutter: Secondary | ICD-10-CM

## 2019-01-02 DIAGNOSIS — I4891 Unspecified atrial fibrillation: Secondary | ICD-10-CM

## 2019-01-02 DIAGNOSIS — C569 Malignant neoplasm of unspecified ovary: Secondary | ICD-10-CM

## 2019-01-02 LAB — CBC
HCT: 28 % — ABNORMAL LOW (ref 36.0–46.0)
Hemoglobin: 8.9 g/dL — ABNORMAL LOW (ref 12.0–15.0)
MCH: 29.6 pg (ref 26.0–34.0)
MCHC: 31.8 g/dL (ref 30.0–36.0)
MCV: 93 fL (ref 80.0–100.0)
Platelets: 81 10*3/uL — ABNORMAL LOW (ref 150–400)
RBC: 3.01 MIL/uL — ABNORMAL LOW (ref 3.87–5.11)
RDW: 21 % — ABNORMAL HIGH (ref 11.5–15.5)
WBC: 3.6 10*3/uL — ABNORMAL LOW (ref 4.0–10.5)
nRBC: 0 % (ref 0.0–0.2)

## 2019-01-02 LAB — BASIC METABOLIC PANEL
Anion gap: 8 (ref 5–15)
BUN: 13 mg/dL (ref 8–23)
CO2: 31 mmol/L (ref 22–32)
Calcium: 6.6 mg/dL — ABNORMAL LOW (ref 8.9–10.3)
Chloride: 100 mmol/L (ref 98–111)
Creatinine, Ser: 0.94 mg/dL (ref 0.44–1.00)
GFR calc Af Amer: >60 mL/min (ref >60)
GFR calc non Af Amer: 59 mL/min — ABNORMAL LOW (ref >60)
Glucose, Bld: 137 mg/dL — ABNORMAL HIGH (ref 70–99)
Potassium: 3.5 mmol/L (ref 3.5–5.1)
Sodium: 139 mmol/L (ref 135–145)

## 2019-01-02 LAB — GLUCOSE, CAPILLARY
Glucose-Capillary: 189 mg/dL — ABNORMAL HIGH (ref 70–99)
Glucose-Capillary: 281 mg/dL — ABNORMAL HIGH (ref 70–99)
Glucose-Capillary: 330 mg/dL — ABNORMAL HIGH (ref 70–99)
Glucose-Capillary: 238 mg/dL — ABNORMAL HIGH (ref 70–99)

## 2019-01-02 LAB — PROTIME-INR
INR: 1.2 (ref 0.8–1.2)
Prothrombin Time: 14.9 seconds (ref 11.4–15.2)

## 2019-01-02 NOTE — Progress Notes (Signed)
[facility] Gastroenterology Progress Note  Brittany Porter 73 y.o. Aug 04, 1945   Subjective: Nonbloody stool overnight. Sitting in bedside chair. Feels fine. Tolerating diet.  Objective: Vital signs: Vitals:   01/02/19 0646 01/02/19 0842  BP: (!) 104/53   Pulse: 85 87  Resp: 18 19  Temp: 97.9 F (36.6 C)   SpO2: 100% 100%    Physical Exam: Gen: lethargic, elderly, frail, no acute distress  HEENT: anicteric sclera CV: RRR Chest: CTA B Abd: epigastric tenderness with guarding, soft, nondistended, +BS   Lab Results: Recent Labs    12/31/18 0429 01/02/19 0634  NA 141 139  K 3.4* 3.5  CL 106 100  CO2 27 31  GLUCOSE 129* 137*  BUN 19 13  CREATININE 0.91 0.94  CALCIUM 6.6* 6.6*   Recent Labs    12/31/18 0429  AST 28  ALT 12  ALKPHOS 53  BILITOT 0.8  PROT 3.7*  ALBUMIN 1.9*   Recent Labs    12/30/18 1345  01/01/19 0500 01/01/19 2231 01/02/19 0634  WBC 6.1   < > 4.1  --  3.6*  NEUTROABS 4.7  --   --   --   --   HGB 8.0*   < > 7.0* 9.2* 8.9*  HCT 24.0*   < > 22.2* 28.5* 28.0*  MCV 89.9   < > 94.9  --  93.0  PLT 118*   < > 76*  --  81*   < > = values in this interval not displayed.      Assessment/Plan: GI bleed - S/P 1 U PRBCs and Hgb increase from 7 to 9.2 and now 8.9. No signs of ongoing bleeding. Manage conservatively. Hold off on colonoscopy. Will sign off. Call back if questions. F/U with GI prn.   [name] 01/02/2019, 11:43 AM  Questions please call [phone] ID: Brittany Porter, female   DOB: June 25, 1945, 73 y.o.   MRN: [id number]

## 2019-01-02 NOTE — Progress Notes (Addendum)
PROGRESS NOTE  Brittany Porter GXQ:[id number] DOB: 21-May-1945   PCP: [name], MD  Patient is from: Home  DOA: 12/28/2018 LOS: 5  Brief Narrative / Interim history: 73 year old female with history of bilateral breast cancer/bilateral mastectomy with bone, liver and lung mets currently on Xeloda, ovarian and endometrial cancer, IDDM-2, HTN, A. fib on Xarelto, chronic respiratory failure on 2 to 4 L, and chronic pain on occasional NSAIDs presenting with generalized weakness, DOE and palpitation, and admitted for severe symptomatic anemia with hemoglobin of 2.7.  Denies melena or hematochezia. In ED: Patient noted to have mild temp to 99.2.  BP 84/47.  Pulse ox 97.  RR 30.  100% on 4 L.  Hgb 2.6.  WBC 7.6.  Platelet 185.  INR 3.1.  Creatinine 1.07.  BUN 31.  Glucose 259.  Bicarb 15.  AG 16.  LA 7.0.  CXR with stable small to moderate sized left pleural effusion, bibasilar atelectasis versus pneumonia.  Admitted for symptomatic anemia.  4 units of blood ordered.  Also started on broad-spectrum antibiotics for possible pneumonia.  Subjective: Patient reports feeling much better.  Denies any further GI bleeding.  Denies any chest pain, shortness of breath, abdominal pain, nausea/vomiting, fever/chills.  Assessment & Plan:  Principal Problem:   Symptomatic anemia Active Problems:   Ovarian cancer (HCC)   Uterine carcinoma (HCC)   DM2 (diabetes mellitus, type 2) (HCC)   Bilateral breast cancer (HCC)   Atrial fibrillation and flutter (HCC)   Lactic acidosis   Healthcare-associated pneumonia   Severe symptomatic anemia, POA, likely in the setting of GI bleed  Resolving Hemoglobin stable posttransfusion Hgb >> 2.6 (admit), now stable status post 4 units of PRBC, last 1U of PRBC on 8/18 EGD on 8/16-Negative Discontinue home Xarelto, okay by cardiology Advised to no longer take NSAIDs Continue PPI GI on board, no further work-up needed, signed off  Acute diastolic CHF  exacerbation Stable Echo with EF of 55 to 60% Exacerbation likely due to severe anemia, IV fluid resuscitation, blood transfusion  Continue IV Lasix 40 mg daily Daily weight, intake output and renal functions  Acute on chronic hypoxic respiratory failure/?CAP Afebrile, no leukocytosis Currently requiring supplemental oxygen with exertion as well as at rest Chest x-ray revealed stable small to moderate left pleural effusion with left retrocardiac atelectasis versus infiltrate Plan for 5 days of ceftriaxone, to complete on 01/02/2019 Plan for home oxygen eval Continue supplemental oxygen, monitor closely  Pancytopenia Likely 2/2 underlying malignancy Monitor closely, daily CBC  History of bilateral breast cancer/bilateral mastectomy with metastasis to liver, bone and lung.  -Followed by Dr. Lindi Adie -Discussed with Dr. Alen Blew who recommended holding Xeloda in the setting of severe anemia.  Type 2 diabetes mellitus with hyperglycemia Improving HbA1c 6 Continue current regimen and CBG monitoring Hypoglycemic protocol  Paroxysmal atrial fibrillation:  Currently rate controlled Continue amiodarone 200 mg daily (home dose previously BID)  Chad-2-Vasc score 6 (annual stroke or major VTE risk of 11%). HAS-BLED-3 (5.9% and 4.1% risk of major bleeding with Xarelto and Eliquis respectively).  -Discussed with cardiology, Dr. Lovena Le who recommended no further anticoagulation given significant anemia     DVT prophylaxis: SCD Code Status: Full code-confirmed with patient and patient's daughter Disposition Plan: Likely home Consultants: Gastroenterology and oncology  Objective: Vitals:   01/02/19 0646 01/02/19 0842 01/02/19 1347 01/02/19 1359  BP: (!) 104/53   (!) 101/46  Pulse: 85 87 82 86  Resp: 18 19 (!) 24 20  Temp: 97.9 F (36.6  C)   97.6 F (36.4 C)  TempSrc: Oral   Oral  SpO2: 100% 100% 98% 100%  Weight: 85.8 kg     Height:        Intake/Output Summary (Last 24 hours) at  01/02/2019 1546 Last data filed at 01/02/2019 0912 Gross per 24 hour  Intake 604 ml  Output 1700 ml  Net -1096 ml   Filed Weights   12/31/18 0500 01/01/19 0605 01/02/19 0646  Weight: 86 kg 85.1 kg 85.8 kg    Examination:  General: NAD   Cardiovascular: S1, S2 present  Respiratory: CTAB  Abdomen: Soft, nontender, nondistended, bowel sounds present  Musculoskeletal: Trace bilateral pedal edema noted  Skin: Normal  Psychiatry: Normal mood    I have personally reviewed the following labs and images:  Radiology Studies: No results found.  Microbiology: Recent Results (from the past 240 hour(s))  Culture, blood (Routine x 2)     Status: None (Preliminary result)   Collection Time: 12/28/18  7:18 AM   Specimen: BLOOD RIGHT HAND  Result Value Ref Range Status   Specimen Description BLOOD RIGHT HAND  Final   Special Requests   Final    BOTTLES DRAWN AEROBIC AND ANAEROBIC Blood Culture adequate volume   Culture   Final    NO GROWTH 4 DAYS Performed at [facility], [address] [place], [place] [address]    Report Status PENDING  Incomplete  Culture, blood (Routine x 2)     Status: None (Preliminary result)   Collection Time: 12/28/18  7:25 AM   Specimen: BLOOD  Result Value Ref Range Status   Specimen Description BLOOD RIGHT ANTECUBITAL  Final   Special Requests   Final    BOTTLES DRAWN AEROBIC AND ANAEROBIC Blood Culture adequate volume   Culture   Final    NO GROWTH 4 DAYS Performed at [facility], [place] [address], [place], [place] [address]    Report Status PENDING  Incomplete  Urine culture     Status: None   Collection Time: 12/28/18  9:48 PM   Specimen: In/Out Cath Urine  Result Value Ref Range Status   Specimen Description IN/OUT CATH URINE  Final   Special Requests NONE  Final   Culture   Final    NO GROWTH Performed at [facility], [place] [address] [place], [place] [address]    Report Status 12/30/2018 FINAL  Final  SARS  Coronavirus 2 [facility] order, Performed in [facility] hospital lab) Nasopharyngeal Nasopharyngeal Swab     Status: None   Collection Time: 12/28/18  9:48 PM   Specimen: Nasopharyngeal Swab  Result Value Ref Range Status   SARS Coronavirus 2 NEGATIVE NEGATIVE Final    Comment: (NOTE) If result is NEGATIVE SARS-CoV-2 target nucleic acids are NOT DETECTED. The SARS-CoV-2 RNA is generally detectable in upper and lower  respiratory specimens during the acute phase of infection. The lowest  concentration of SARS-CoV-2 viral copies this assay can detect is 250  copies / mL. A negative result does not preclude SARS-CoV-2 infection  and should not be used as the sole basis for treatment or other  patient management decisions.  A negative result may occur with  improper specimen collection / handling, submission of specimen other  than nasopharyngeal swab, presence of viral mutation(s) within the  areas targeted by this assay, and inadequate number of viral copies  (<250 copies / mL). A negative result must be combined with clinical  observations, patient history, and epidemiological information. If result is POSITIVE SARS-CoV-2 target nucleic acids are DETECTED. The SARS-CoV-2 RNA is generally detectable in upper and lower  respiratory specimens dur ing the acute phase of infection.  Positive  results are indicative of active infection with SARS-CoV-2.  Clinical  correlation with patient history and other diagnostic information is  necessary to determine patient infection status.  Positive results do  not rule out bacterial infection or co-infection with other viruses. If result is PRESUMPTIVE POSTIVE SARS-CoV-2 nucleic acids MAY BE PRESENT.   A presumptive positive result was obtained on the submitted specimen  and confirmed on repeat testing.  While 2019 novel coronavirus  (SARS-CoV-2) nucleic acids may be present in the submitted sample  additional confirmatory testing may be necessary  for epidemiological  and / or clinical management purposes  to differentiate between  SARS-CoV-2 and other Sarbecovirus currently known to infect humans.  If clinically indicated additional testing with an alternate test  methodology [phone]) is advised. The SARS-CoV-2 RNA is generally  detectable in upper and lower respiratory sp ecimens during the acute  phase of infection. The expected result is Negative. Fact Sheet for Patients:  StrictlyIdeas.no Fact Sheet for Healthcare Providers: BankingDealers.co.za This test is not yet approved or cleared by the [place] FDA and has been authorized for detection and/or diagnosis of SARS-CoV-2 by FDA under an Emergency Use Authorization (EUA).  This EUA will remain in effect (meaning this test can be used) for the duration of the COVID-19 declaration under Section 564(b)(1) of the Act, 21 U.S.C. section 360bbb-3(b)(1), unless the authorization is terminated or revoked sooner. Performed at [facility], [place] [address], [place], [place] [address]     Urine analysis:    Component Value Date/Time   COLORURINE YELLOW 12/28/2018 2148   APPEARANCEUR CLEAR 12/28/2018 2148   LABSPEC 1.015 12/28/2018 2148   PHURINE 5.0 12/28/2018 2148   GLUCOSEU 50 (A) 12/28/2018 2148   HGBUR NEGATIVE 12/28/2018 2148   Ada NEGATIVE 12/28/2018 2148   KETONESUR 5 (A) 12/28/2018 2148   PROTEINUR NEGATIVE 12/28/2018 2148   UROBILINOGEN 0.2 05/15/2012 0221   NITRITE NEGATIVE 12/28/2018 2148   LEUKOCYTESUR NEGATIVE 12/28/2018 2148    Anemia Panel: No results for input(s): VITAMINB12, FOLATE, FERRITIN, TIBC, IRON, RETICCTPCT in the last 72 hours.  CBG: Recent Labs  Lab 01/01/19 1317 01/01/19 1612 01/01/19 2222 01/02/19 0911 01/02/19 1247  GLUCAP 310* 250* 256* 189* 281*    HbA1C: Recent Labs    12/31/18 0429  HGBA1C 6.0*    BNP (last 3 results): No results for input(s): PROBNP in  the last 8760 hours.  Cardiac Enzymes: Recent Labs  Lab 12/30/18 0658  CKTOTAL 140    Coagulation Profile: Recent Labs  Lab 12/28/18 2140 12/30/18 0658 12/31/18 0429 01/01/19 0500 01/02/19 0634  INR 3.1* 1.5* 1.4* 1.2 1.2    Liver Function Tests: Recent Labs  Lab 12/28/18 1826 12/29/18 1353 12/30/18 0658 12/31/18 0429  AST 35 39  --  28  ALT 11 14  --  12  ALKPHOS 52 59  --  53  BILITOT 1.0 0.9  --  0.8  PROT 4.0* 4.5*  --  3.7*  ALBUMIN 2.2* 2.5* 2.1* 1.9*   No results for input(s): LIPASE, AMYLASE in the last 168 hours. No results for input(s): AMMONIA in the last 168 hours.  Basic Metabolic Panel: Recent Labs  Lab 12/28/18 1826 12/29/18 1353 12/30/18 0658 12/31/18 0429 01/02/19 0634  NA 141 144  143 141 139  K 4.2 3.0* 4.3 3.4* 3.5  CL 110 109 111 106 100  CO2 15* 23 24 27 31   GLUCOSE 259* 204* 150* 129* 137*  BUN 31* 29* 26* 19 13  CREATININE 1.07* 1.08* 1.05* 0.91 0.94  CALCIUM 6.5* 6.6* 6.4* 6.6* 6.6*   GFR: Estimated Creatinine Clearance: 54.3 mL/min (by C-G formula based on SCr of 0.94 mg/dL).  CBC: Recent Labs  Lab 12/29/18 1352  12/29/18 1843  12/30/18 0129 12/30/18 0658 12/30/18 1345 12/31/18 0429 01/01/19 0500 01/01/19 2231 01/02/19 0634  WBC 7.1  --  7.5  --  6.2 5.8 6.1 5.0 4.1  --  3.6*  NEUTROABS 6.4  --  5.9  --  4.6 4.5 4.7  --   --   --   --   HGB 9.5*   < > 9.1*   < > 7.8* 8.1* 8.0* 7.2* 7.0* 9.2* 8.9*  HCT 28.1*   < > 26.8*   < > 22.7* 24.0* 24.0* 22.2* 22.2* 28.5* 28.0*  MCV 88.9  --  88.4  --  87.6 89.9 89.9 93.3 94.9  --  93.0  PLT 138*  --  128*  --  125* 131* 118* 92* 76*  --  81*   < > = values in this interval not displayed.    Procedures:  8/16-EGD negative  Microbiology summarized: SARS-CoV-2 negative. Blood cultures NGTD    Antimicrobials: Anti-infectives (From admission, onward)   Start     Dose/Rate Route Frequency Ordered Stop   12/29/18 2000  vancomycin (VANCOCIN) IVPB 750 mg/150 ml premix   Status:  Discontinued     750 mg 150 mL/hr over 60 Minutes Intravenous Every 24 hours 12/28/18 1952 12/29/18 1101   12/29/18 1115  cefTRIAXone (ROCEPHIN) 2 g in sodium chloride 0.9 % 100 mL IVPB     2 g 200 mL/hr over 30 Minutes Intravenous Every 24 hours 12/29/18 1102     12/29/18 0800  ceFEPIme (MAXIPIME) 2 g in sodium chloride 0.9 % 100 mL IVPB  Status:  Discontinued     2 g 200 mL/hr over 30 Minutes Intravenous Every 12 hours 12/28/18 1952 12/29/18 1101   12/28/18 1915  vancomycin (VANCOCIN) 1,500 mg in sodium chloride 0.9 % 500 mL IVPB     1,500 mg 250 mL/hr over 120 Minutes Intravenous  Once 12/28/18 1904 12/29/18 0116   12/28/18 1900  ceFEPIme (MAXIPIME) 2 g in sodium chloride 0.9 % 100 mL IVPB     2 g 200 mL/hr over 30 Minutes Intravenous  Once 12/28/18 1847 12/28/18 2007   12/28/18 1900  metroNIDAZOLE (FLAGYL) IVPB 500 mg     500 mg 100 mL/hr over 60 Minutes Intravenous  Once 12/28/18 1847 12/28/18 2118   12/28/18 1900  vancomycin (VANCOCIN) IVPB 1000 mg/200 mL premix  Status:  Discontinued     1,000 mg 200 mL/hr over 60 Minutes Intravenous  Once 12/28/18 1847 12/28/18 1904      Sch Meds:  Scheduled Meds: . sodium chloride   Intravenous Once  . amiodarone  200 mg Oral Daily  . atorvastatin  20 mg Oral q1800  . calcium-vitamin D  1 tablet Oral BID WC  . cholecalciferol  2,000 Units Oral Daily  . furosemide  40 mg Intravenous Daily  . gabapentin  100 mg Oral BID  . insulin aspart  0-5 Units Subcutaneous QHS  . insulin aspart  0-9 Units Subcutaneous TID WC  . ipratropium-albuterol  3 mL Inhalation TID  .  magnesium oxide  200 mg Oral QHS  . metoprolol succinate  25 mg Oral Daily  . omega-3 acid ethyl esters  1 g Oral BID  . pantoprazole (PROTONIX) IV  40 mg Intravenous Q12H  . polyethylene glycol  17 g Oral BID  . sodium chloride flush  3 mL Intravenous Q12H   Continuous Infusions: . sodium chloride    . sodium chloride 250 mL (12/31/18 1339)  . cefTRIAXone  (ROCEPHIN)  IV 2 g (01/02/19 1001)   PRN Meds:.sodium chloride, ferrous sulfate, ipratropium-albuterol, ondansetron, prochlorperazine, sodium chloride, sodium chloride flush    Alma Friendly, MD  [facility]  If 7PM-7AM, please contact night-coverage [email or web address] 01/02/2019, 3:46 PM

## 2019-01-03 DIAGNOSIS — J189 Pneumonia, unspecified organism: Secondary | ICD-10-CM

## 2019-01-03 LAB — CULTURE, BLOOD (ROUTINE X 2)
Culture: NO GROWTH
Culture: NO GROWTH
Special Requests: ADEQUATE
Special Requests: ADEQUATE

## 2019-01-03 LAB — BASIC METABOLIC PANEL
Anion gap: 8 (ref 5–15)
BUN: 14 mg/dL (ref 8–23)
CO2: 31 mmol/L (ref 22–32)
Calcium: 7.1 mg/dL — ABNORMAL LOW (ref 8.9–10.3)
Chloride: 98 mmol/L (ref 98–111)
Creatinine, Ser: 1.08 mg/dL — ABNORMAL HIGH (ref 0.44–1.00)
GFR calc Af Amer: 58 mL/min — ABNORMAL LOW (ref >60)
GFR calc non Af Amer: 50 mL/min — ABNORMAL LOW (ref >60)
Glucose, Bld: 169 mg/dL — ABNORMAL HIGH (ref 70–99)
Potassium: 3.6 mmol/L (ref 3.5–5.1)
Sodium: 137 mmol/L (ref 135–145)

## 2019-01-03 LAB — CBC
HCT: 27.6 % — ABNORMAL LOW (ref 36.0–46.0)
Hemoglobin: 8.9 g/dL — ABNORMAL LOW (ref 12.0–15.0)
MCH: 30.6 pg (ref 26.0–34.0)
MCHC: 32.2 g/dL (ref 30.0–36.0)
MCV: 94.8 fL (ref 80.0–100.0)
Platelets: 108 10*3/uL — ABNORMAL LOW (ref 150–400)
RBC: 2.91 MIL/uL — ABNORMAL LOW (ref 3.87–5.11)
RDW: 21.2 % — ABNORMAL HIGH (ref 11.5–15.5)
WBC: 4 10*3/uL (ref 4.0–10.5)
nRBC: 0 % (ref 0.0–0.2)

## 2019-01-03 LAB — GLUCOSE, CAPILLARY
Glucose-Capillary: 163 mg/dL — ABNORMAL HIGH (ref 70–99)
Glucose-Capillary: 273 mg/dL — ABNORMAL HIGH (ref 70–99)

## 2019-01-03 LAB — PROTIME-INR
INR: 1.2 (ref 0.8–1.2)
Prothrombin Time: 15.1 seconds (ref 11.4–15.2)

## 2019-01-03 MED ORDER — ATORVASTATIN CALCIUM 20 MG PO TABS: 20.0000 mg | ORAL_TABLET | Freq: Every day | ORAL | 0 refills | Status: DC

## 2019-01-03 MED ORDER — FUROSEMIDE 20 MG PO TABS: 20.0000 mg | ORAL_TABLET | Freq: Every day | ORAL | 0 refills | Status: AC

## 2019-01-03 MED ORDER — METFORMIN HCL 500 MG PO TABS: 1000.0000 mg | ORAL_TABLET | Freq: Two times a day (BID) | ORAL | 0 refills | Status: AC

## 2019-01-03 MED ORDER — AMIODARONE HCL 200 MG PO TABS: 200.0000 mg | ORAL_TABLET | Freq: Every day | ORAL | Status: DC

## 2019-01-03 MED ORDER — PANTOPRAZOLE SODIUM 40 MG PO TBEC: 40.0000 mg | DELAYED_RELEASE_TABLET | Freq: Two times a day (BID) | ORAL | 0 refills | Status: DC

## 2019-01-03 NOTE — Progress Notes (Signed)
MEWS Guidelines - (patients age 75 and over)  Red - At High Risk for Deterioration Yellow - At risk for Deterioration  1. Go to room and assess patient 2. Validate data. Is this patient's baseline? If data confirmed: 3. Is this an acute change? 4. Administer prn meds/treatments as ordered. 5. Note Sepsis score 6. Review goals of care 7. Sports coach, RRT nurse and Provider. 8. Ask Provider to come to bedside.  9. Document patient condition/interventions/response. 10. Increase frequency of vital signs and focused assessments to at least q15 minutes x 4, then q30 minutes x2. - If stable, then q1h x3, then q4h x3 and then q8h or dept. routine. - If unstable, contact Provider & RRT nurse. Prepare for possible transfer. 11. Add entry in progress notes using the smart phrase ".MEWS". 1. Go to room and assess patient 2. Validate data. Is this patient's baseline? If data confirmed: 3. Is this an acute change? 4. Administer prn meds/treatments as ordered? 5. Note Sepsis score 6. Review goals of care 7. Sports coach and Provider 8. Call RRT nurse as needed. 9. Document patient condition/interventions/response. 10. Increase frequency of vital signs and focused assessments to at least q2h x2. - If stable, then q4h x2 and then q8h or dept. routine. - If unstable, contact Provider & RRT nurse. Prepare for possible transfer. 11. Add entry in progress notes using the smart phrase ".MEWS".  Green - Likely stable Lavender - Comfort Care Only  1. Continue routine/ordered monitoring.  2. Review goals of care. 1. Continue routine/ordered monitoring. 2. Review goals of care.    Pt up ambulating in room to [facility] and Chair with walker and mod assist. Oxygen 1.5L overnight removed and now on RA. Pt states she does not need O2 and only uses it prn at home. MEWS score does not indicate an acute change. No further intervention necessary.

## 2019-01-03 NOTE — Progress Notes (Signed)
Occupational Therapy Treatment Patient Details Name: Brittany Porter MRN: [id number] DOB: 10-02-1945 Today's Date: 01/03/2019    History of present illness 73 year old female with history of bilateral breast cancer/bilateral mastectomy with bone, liver and lung mets currently on Xeloda, ovarian and endometrial cancer, IDDM-2, HTN, A. fib on Xarelto, chronic respiratory failure on 2 to 4 L, and chronic pain on occasional NSAIDs presenting with generalized weakness, DOE and palpitation, and admitted 12/28/18 for severe symptomatic anemia with hemoglobin of 2.7.    OT comments  Pt seen with PT today for ADL progression and functional mobility progression. Pt MOD-MIN +2 for functional transfers needing most assist for initial stand d/t decreased standing balance. Pt Total assist for pericare after BM and supervision for LB dressing for safety when reaching out of BOS.  Pt declining further skilled therapy services despite education on level of assist pt currently needs based on CLOF. Issued pt gait belt to take home and educated pt on benefits of using it at home during functional mobility. Pt verbalized understanding. Will continue to follow for acute OT needs.   Follow Up Recommendations  SNF    Equipment Recommendations  3 in 1 bedside commode;Tub/shower seat    Recommendations for Other Services      Precautions / Restrictions Precautions Precautions: Fall Precaution Comments: moniter BP Restrictions Weight Bearing Restrictions: No       Mobility Bed Mobility               General bed mobility comments: pt in recliner upon arrival  Transfers Overall transfer level: Needs assistance Equipment used: Rolling walker (2 wheeled) Transfers: Sit to/from Stand Sit to Stand: Mod assist;Min assist         General transfer comment: mod A +2 initially to stand from recliner with RW- Verbal and tactile cues for body mehanics during transfers    Balance Overall balance assessment:  Needs assistance Sitting-balance support: Feet supported;Bilateral upper extremity supported Sitting balance-Leahy Scale: Fair     Standing balance support: Bilateral upper extremity supported;Single extremity supported;During functional activity Standing balance-Leahy Scale: Poor                             ADL either performed or assessed with clinical judgement   ADL Overall ADL's : Needs assistance/impaired                     Lower Body Dressing: Supervision/safety Lower Body Dressing Details (indicate cue type and reason): verabal cues for safety when reaching down to pull up sock Toilet Transfer: Moderate assistance;Minimal assistance;+2 for physical assistance;Cueing for sequencing;Cueing for safety;RW;BSC;Stand-pivot Toilet Transfer Details (indicate cue type and reason): verbal cues to feel BSC on back of legs before sitting- pt MOD +2 to initially stand and MIN assist for safety during functional transfers Toileting- Clothing Manipulation and Hygiene: Total assistance;Sit to/from stand Toileting - Clothing Manipulation Details (indicate cue type and reason): increased assist for pericare after BM d/t decreased dynamic standing balance     Functional mobility during ADLs: Moderate assistance;Minimal assistance;Rolling walker;Cueing for safety;Cueing for sequencing General ADL Comments: MIN-MOD assist for safety during functional ADLs d/t decreased balance during funcitonal tasks     Vision       Perception     Praxis      Cognition Arousal/Alertness: Awake/alert Behavior During Therapy: WFL for tasks assessed/performed Overall Cognitive Status: Within Functional Limits for tasks assessed  Exercises     Shoulder Instructions       General Comments      Pertinent Vitals/ Pain       Pain Assessment: No/denies pain  Home Living                                           Prior Functioning/Environment              Frequency  Min 2X/week        Progress Toward Goals  OT Goals(current goals can now be found in the care plan section)  Progress towards OT goals: Progressing toward goals  Acute Rehab OT Goals Time For Goal Achievement: 01/07/19 Potential to Achieve Goals: Good  Plan      Co-evaluation    PT/OT/SLP Co-Evaluation/Treatment: Yes Reason for Co-Treatment: For patient/therapist safety;To address functional/ADL transfers   OT goals addressed during session: ADL's and self-care      AM-PAC OT "6 Clicks" Daily Activity     Outcome Measure   Help from another person eating meals?: None Help from another person taking care of personal grooming?: A Little Help from another person toileting, which includes using toliet, bedpan, or urinal?: A Lot Help from another person bathing (including washing, rinsing, drying)?: A Lot Help from another person to put on and taking off regular upper body clothing?: A Little Help from another person to put on and taking off regular lower body clothing?: A Little 6 Click Score: 17    End of Session Equipment Utilized During Treatment: Gait belt;Rolling walker  OT Visit Diagnosis: Unsteadiness on feet (R26.81);Other abnormalities of gait and mobility (R26.89);Muscle weakness (generalized) (M62.81)   Activity Tolerance Patient tolerated treatment well   Patient Left in chair;with call bell/phone within reach   Nurse Communication Mobility status;Other (comment)(need for purewic placment)        Time: 5573-2202 OT Time Calculation (min): 22 min  Charges: OT General Charges $OT Visit: 1 Visit OT Treatments $Self Care/Home Management : 8-22 mins     Ihor Gully, COTA/L 01/03/2019, 1:59 PM  [phone]

## 2019-01-03 NOTE — Plan of Care (Signed)
  Problem: Education: Goal: Knowledge of General Education information will improve Description: Including pain rating scale, medication(s)/side effects and non-pharmacologic comfort measures Outcome: Adequate for Discharge   

## 2019-01-03 NOTE — Discharge Summary (Signed)
Discharge Summary  Brittany Porter GYI:[id number] DOB: 08/25/1945  PCP: [name], MD  Admit date: 12/28/2018 Discharge date: 01/03/2019  Time spent: 40 mins  Recommendations for Outpatient Follow-up:  1. Follow-up with PCP with repeat labs in 1 week 2. Follow-up with cardiology in 1 week 3. Follow-up with Dr. Lindi Adie oncology  Discharge Diagnoses:  Active Hospital Problems   Diagnosis Date Noted  . Symptomatic anemia 12/28/2018  . Lactic acidosis 12/28/2018  . Healthcare-associated pneumonia 12/28/2018  . Atrial fibrillation and flutter ([place]) 06/11/2018  . Bilateral breast cancer ([place]) 09/25/2013  . DM2 (diabetes mellitus, type 2) ([place]) 01/21/2012  . Ovarian cancer ([place]) 08/09/2011  . Uterine carcinoma ([place]) 08/09/2011    Resolved Hospital Problems  No resolved problems to display.    Discharge Condition: Stable  Diet recommendation: Heart healthy  Vitals:   01/03/19 0800 01/03/19 0858  BP: (!) 120/55 (!) 120/55  Pulse:  (!) 101  Resp: (!) 26 (!) 22  Temp:    SpO2:  96%    History of present illness:  73 year old female with history of bilateral breast cancer/bilateral mastectomy with bone, liver and lung mets currently on Xeloda, ovarian and endometrial cancer, IDDM-2, HTN, A. fib on Xarelto, chronic respiratory failure on 2 to 4 L, and chronic pain on occasional NSAIDs presenting with generalized weakness, DOE and palpitation, and admitted for severe symptomatic anemia with hemoglobin of 2.7.  Denies melena or hematochezia. In ED: Patient noted to have mild temp to 99.2.  BP 84/47.  Pulse ox 97.  RR 30.  100% on 4 L.  Hgb 2.6.  WBC 7.6.  Platelet 185.  INR 3.1.  Creatinine 1.07.  BUN 31.  Glucose 259.  Bicarb 15.  AG 16.  LA 7.0.  CXR with stable small to moderate sized left pleural effusion, bibasilar atelectasis versus pneumonia.  Admitted for symptomatic anemia.  4 units of blood ordered.  Also started on broad-spectrum antibiotics for possible  pneumonia.    Today, patient denies any new complaints, had a bowel movement with no blood noted.  Denies any abdominal pain, cough, chest pain, or shortness of breath, nausea/vomiting, fever/chills.  Discussed with daughter in details about discharge plans and follow-up appointments.  Hospital Course:  Principal Problem:   Symptomatic anemia Active Problems:   Ovarian cancer (HCC)   Uterine carcinoma (HCC)   DM2 (diabetes mellitus, type 2) (HCC)   Bilateral breast cancer (HCC)   Atrial fibrillation and flutter (HCC)   Lactic acidosis   Healthcare-associated pneumonia  Severe symptomatic anemia, POA, likely in the setting of GI bleed  Improved Hemoglobin stable posttransfusion Hgb >> 2.6 (admit), now stable status post 4 units of PRBC, last 1U of PRBC on 8/18 EGD on 8/16-Negative Discontinue home Xarelto, okay by cardiology Advised to no longer take NSAIDs Continue PPI GI on board, no further work-up needed  Acute diastolic CHF exacerbation Stable Echo with EF of 55 to 60% Exacerbation likely due to severe anemia, IV fluid resuscitation, blood transfusion  Continue home Lasix 20 mg daily until follow-up with cardiology  Acute on chronic hypoxic respiratory failure/?CAP Afebrile, no leukocytosis Chest x-ray revealed stable small to moderate left pleural effusion with left retrocardiac atelectasis versus infiltrate Completed 5 days of ceftriaxone Continue home supplemental oxygen  Pancytopenia Likely 2/2 underlying malignancy Follow-up with PCP with repeat labs as well as oncology  History of bilateral breast cancer/bilateral mastectomy with metastasis to liver, bone and lung.  Followed by Dr. Lindi Adie Asked daughter to give Dr.  Gudena a call prior to starting Xeloda While in the hospital, Dr. Alen Blew recommended holding Xeloda in the setting of severe anemia.  Type 2 diabetes mellitus with hyperglycemia Improving HbA1c 6 Continue home regimen  Paroxysmal atrial  fibrillation:  Currently rate controlled Continue amiodarone 200 mg daily  Chad-2-Vasc score 6 (annual stroke or major VTE risk of 11%). HAS-BLED-3 (5.9% and 4.1% risk of major bleeding with Xarelto and Eliquis respectively).  Discussed with cardiology, Dr. Lovena Le who recommended no further anticoagulation given significant anemia  Obesity Lifestyle modification advised           Malnutrition Type:      Malnutrition Characteristics:      Nutrition Interventions:      Estimated body mass index is 33.06 kg/m as calculated from the following:   Height as of this encounter: 5' 3.5" (1.613 m).   Weight as of this encounter: 86 kg.    Procedures:  EGD  Consultations:  Gastroenterology  Oncology  Discharge Exam: BP (!) 120/55   Pulse (!) 101   Temp 98 F (36.7 C) (Oral)   Resp (!) 22   Ht 5' 3.5" (1.613 m)   Wt 86 kg   SpO2 96%   BMI 33.06 kg/m   General: NAD Cardiovascular: S1, S2 present Respiratory: Mild bibasilar crackles noted  Discharge Instructions You were cared for by a hospitalist during your hospital stay. If you have any questions about your discharge medications or the care you received while you were in the hospital after you are discharged, you can call the unit and asked to speak with the hospitalist on call if the hospitalist that took care of you is not available. Once you are discharged, your primary care physician will handle any further medical issues. Please note that NO REFILLS for any discharge medications will be authorized once you are discharged, as it is imperative that you return to your primary care physician (or establish a relationship with a primary care physician if you do not have one) for your aftercare needs so that they can reassess your need for medications and monitor your lab values.   Allergies as of 01/03/2019      Reactions   Prednisolone Shortness Of Breath, Other (See Comments)    increased BP   Prednisone  Anaphylaxis   Sulfa Antibiotics Swelling   "Eyes swelled shut" - reaction to eye drops      Medication List    STOP taking these medications   ibuprofen 200 MG tablet Commonly known as: ADVIL   naproxen sodium 220 MG tablet Commonly known as: ALEVE   rivaroxaban 20 MG Tabs tablet Commonly known as: XARELTO   simvastatin 40 MG tablet Commonly known as: ZOCOR     TAKE these medications   amiodarone 200 MG tablet Commonly known as: PACERONE Take 1 tablet (200 mg total) by mouth daily.   atorvastatin 20 MG tablet Commonly known as: LIPITOR Take 1 tablet (20 mg total) by mouth daily at 6 PM.   Calcium 600+D3 600-800 MG-UNIT Tabs Generic drug: Calcium Carb-Cholecalciferol Take 1 tablet by mouth 2 (two) times daily.   capecitabine 500 MG tablet Commonly known as: XELODA Take 3 tablets (1,500 mg total) by mouth 2 (two) times daily after a meal. Take for 14 days on, 7 days off, repeat every 21 days   Combivent Respimat 20-100 MCG/ACT Aers respimat Generic drug: Ipratropium-Albuterol Inhale 1 puff into the lungs every 6 (six) hours. What changed:   when to take this  reasons to take this   ferrous sulfate 325 (65 FE) MG tablet Take 325 mg by mouth daily as needed (energy).   fish oil-omega-3 fatty acids 1000 MG capsule Take 1 g by mouth 2 (two) times daily.   furosemide 20 MG tablet Commonly known as: LASIX Take 1 tablet (20 mg total) by mouth daily. Reported on 05/07/2015 What changed:   when to take this  reasons to take this   gabapentin 100 MG capsule Commonly known as: NEURONTIN Take 100 mg by mouth 2 (two) times daily.   lidocaine-prilocaine cream Commonly known as: EMLA Apply to affected area once What changed:   how much to take  how to take this  when to take this  reasons to take this   Magnesium Oxide 200 MG Tabs Commonly known as: Mag-Oxide Take 1 tablet (200 mg total) by mouth daily. What changed: when to take this   metFORMIN 500  MG tablet Commonly known as: GLUCOPHAGE Take 2 tablets (1,000 mg total) by mouth 2 (two) times daily with a meal.   metoprolol succinate 25 MG 24 hr tablet Commonly known as: TOPROL-XL Take 1 tablet (25 mg total) by mouth daily.   ondansetron 8 MG tablet Commonly known as: Zofran Take 1 tablet (8 mg total) by mouth 2 (two) times daily as needed (Nausea or vomiting).   OXYGEN Inhale 4 L into the lungs continuous.   pantoprazole 40 MG tablet Commonly known as: PROTONIX Take 1 tablet (40 mg total) by mouth 2 (two) times daily before a meal.   prochlorperazine 10 MG tablet Commonly known as: COMPAZINE Take 1 tablet (10 mg total) by mouth every 6 (six) hours as needed (Nausea or vomiting).   sodium chloride 0.65 % Soln nasal spray Commonly known as: OCEAN Place 1 spray into both nostrils as needed for congestion.   Vitamin D3 50 MCG (2000 UT) Tabs Take 2,000 mg by mouth daily.            Durable Medical Equipment  (From admission, onward)         Start     Ordered   01/03/19 1200  For home use only DME 3 n 1  Once     01/03/19 1159         Allergies  Allergen Reactions  . Prednisolone Shortness Of Breath and Other (See Comments)     increased BP  . Prednisone Anaphylaxis  . Sulfa Antibiotics Swelling    "Eyes swelled shut" - reaction to eye drops   Follow-up Information    [name], MD. Schedule an appointment as soon as possible for a visit in 1 week(s).   Specialty: Family Medicine Contact information: [address] [place] [address] [phone]        Nicholas Lose, MD. Call in 1 week(s).   Specialty: Hematology and Oncology Contact information: [place] [address] [phone]        Elouise Munroe, MD. Schedule an appointment as soon as possible for a visit in 1 week(s).   Specialties: Cardiology, Radiology Contact information: [address] [place]  [address] [phone]            The results of significant diagnostics from this hospitalization (including imaging, microbiology, ancillary and laboratory) are listed below for reference.    Significant Diagnostic Studies: Dg Chest Port 1 View  Result Date: 12/29/2018 CLINICAL DATA:  Acute respiratory distress. Recent transfusion. EXAM: PORTABLE CHEST 1  VIEW COMPARISON:  12/28/2018 FINDINGS: Cardiomegaly remains stable. Small to moderate left pleural effusion and left retrocardiac atelectasis or infiltrate show no significant change. Probable tiny right pleural effusion. Surgical clips again seen in both axillary regions. IMPRESSION: 1. No significant change in small to moderate left pleural effusion and left retrocardiac atelectasis versus infiltrate. 2. Stable cardiomegaly. Probable tiny right pleural effusion. Electronically Signed   By: Marlaine Hind M.D.   On: 12/29/2018 05:27   Dg Chest Portable 1 View  Result Date: 12/28/2018 CLINICAL DATA:  Nausea, vomiting and intermittent shortness of breath. EXAM: PORTABLE CHEST 1 VIEW COMPARISON:  11/27/2018. FINDINGS: The cardiac silhouette remains mildly enlarged. Small to moderate-sized left pleural effusion, increased. Increased left basilar airspace opacity. Minimal right basilar atelectasis with improvement. Stable diffuse peribronchial thickening and accentuation of the interstitial markings. Bilateral postmastectomy changes with bilateral axillary surgical clips. IMPRESSION: 1. Small to moderate-sized left pleural effusion, increased. 2. Increased left basilar atelectasis or pneumonia. 3. Minimal right basilar atelectasis with improvement. 4. Stable mild cardiomegaly and chronic bronchitic changes. Electronically Signed   By: Claudie Revering M.D.   On: 12/28/2018 21:53    Microbiology: Recent Results (from the past 240 hour(s))  Culture, blood (Routine x 2)     Status: None   Collection Time: 12/28/18  7:18 AM   Specimen: BLOOD RIGHT HAND   Result Value Ref Range Status   Specimen Description BLOOD RIGHT HAND  Final   Special Requests   Final    BOTTLES DRAWN AEROBIC AND ANAEROBIC Blood Culture adequate volume   Culture   Final    NO GROWTH 5 DAYS Performed at [facility], [address], [place], [place] [address]    Report Status 01/03/2019 FINAL  Final  Culture, blood (Routine x 2)     Status: None   Collection Time: 12/28/18  7:25 AM   Specimen: BLOOD  Result Value Ref Range Status   Specimen Description BLOOD RIGHT ANTECUBITAL  Final   Special Requests   Final    BOTTLES DRAWN AEROBIC AND ANAEROBIC Blood Culture adequate volume   Culture   Final    NO GROWTH 5 DAYS Performed at [facility], [place] [address] [place], [place] [address]    Report Status 01/03/2019 FINAL  Final  Urine culture     Status: None   Collection Time: 12/28/18  9:48 PM   Specimen: In/Out Cath Urine  Result Value Ref Range Status   Specimen Description IN/OUT CATH URINE  Final   Special Requests NONE  Final   Culture   Final    NO GROWTH Performed at [facility], [place] [address] [place], [place] [address]    Report Status 12/30/2018 FINAL  Final  SARS Coronavirus 2 [facility] order, Performed in [facility] hospital lab) Nasopharyngeal Nasopharyngeal Swab     Status: None   Collection Time: 12/28/18  9:48 PM   Specimen: Nasopharyngeal Swab  Result Value Ref Range Status   SARS Coronavirus 2 NEGATIVE NEGATIVE Final    Comment: (NOTE) If result is NEGATIVE SARS-CoV-2 target nucleic acids are NOT DETECTED. The SARS-CoV-2 RNA is generally detectable in upper and lower  respiratory specimens during the acute phase of infection. The lowest  concentration of SARS-CoV-2 viral copies this assay can detect is 250  copies / mL. A negative result does not preclude SARS-CoV-2 infection  and should not be used as the sole basis for treatment or other  patient management decisions.  A negative result may occur with  improper  specimen collection / handling, submission of specimen other  than nasopharyngeal swab, presence of viral mutation(s) within the  areas targeted by this assay, and inadequate number of viral copies  (<250 copies / mL). A negative result must be combined with clinical  observations, patient history, and epidemiological information. If result is POSITIVE SARS-CoV-2 target nucleic acids are DETECTED. The SARS-CoV-2 RNA is generally detectable in upper and lower  respiratory specimens dur ing the acute phase of infection.  Positive  results are indicative of active infection with SARS-CoV-2.  Clinical  correlation with patient history and other diagnostic information is  necessary to determine patient infection status.  Positive results do  not rule out bacterial infection or co-infection with other viruses. If result is PRESUMPTIVE POSTIVE SARS-CoV-2 nucleic acids MAY BE PRESENT.   A presumptive positive result was obtained on the submitted specimen  and confirmed on repeat testing.  While 2019 novel coronavirus  (SARS-CoV-2) nucleic acids may be present in the submitted sample  additional confirmatory testing may be necessary for epidemiological  and / or clinical management purposes  to differentiate between  SARS-CoV-2 and other Sarbecovirus currently known to infect humans.  If clinically indicated additional testing with an alternate test  methodology [phone]) is advised. The SARS-CoV-2 RNA is generally  detectable in upper and lower respiratory sp ecimens during the acute  phase of infection. The expected result is Negative. Fact Sheet for Patients:  StrictlyIdeas.no Fact Sheet for Healthcare Providers: BankingDealers.co.za This test is not yet approved or cleared by the [place] FDA and has been authorized for detection and/or diagnosis of SARS-CoV-2 by FDA under an Emergency Use Authorization (EUA).  This EUA will remain in  effect (meaning this test can be used) for the duration of the COVID-19 declaration under Section 564(b)(1) of the Act, 21 U.S.C. section 360bbb-3(b)(1), unless the authorization is terminated or revoked sooner. Performed at [facility], [place] [address], [place], [place] [address]      Labs: Basic Metabolic Panel: Recent Labs  Lab 12/29/18 1353 12/30/18 0658 12/31/18 0429 01/02/19 0634 01/03/19 0500  NA 144 143 141 139 137  K 3.0* 4.3 3.4* 3.5 3.6  CL 109 111 106 100 98  CO2 23 24 27 31 31   GLUCOSE 204* 150* 129* 137* 169*  BUN 29* 26* 19 13 14   CREATININE 1.08* 1.05* 0.91 0.94 1.08*  CALCIUM 6.6* 6.4* 6.6* 6.6* 7.1*   Liver Function Tests: Recent Labs  Lab 12/28/18 1826 12/29/18 1353 12/30/18 0658 12/31/18 0429  AST 35 39  --  28  ALT 11 14  --  12  ALKPHOS 52 59  --  53  BILITOT 1.0 0.9  --  0.8  PROT 4.0* 4.5*  --  3.7*  ALBUMIN 2.2* 2.5* 2.1* 1.9*   No results for input(s): LIPASE, AMYLASE in the last 168 hours. No results for input(s): AMMONIA in the last 168 hours. CBC: Recent Labs  Lab 12/29/18 1352  12/29/18 1843  12/30/18 0129 12/30/18 0658 12/30/18 1345 12/31/18 0429 01/01/19 0500 01/01/19 2231 01/02/19 0634 01/03/19 0500  WBC 7.1  --  7.5  --  6.2 5.8 6.1 5.0 4.1  --  3.6* 4.0  NEUTROABS 6.4  --  5.9  --  4.6 4.5 4.7  --   --   --   --   --   HGB 9.5*   < > 9.1*   < > 7.8* 8.1* 8.0*  7.2* 7.0* 9.2* 8.9* 8.9*  HCT 28.1*   < > 26.8*   < > 22.7* 24.0* 24.0* 22.2* 22.2* 28.5* 28.0* 27.6*  MCV 88.9  --  88.4  --  87.6 89.9 89.9 93.3 94.9  --  93.0 94.8  PLT 138*  --  128*  --  125* 131* 118* 92* 76*  --  81* 108*   < > = values in this interval not displayed.   Cardiac Enzymes: Recent Labs  Lab 12/30/18 0658  CKTOTAL 140   BNP: BNP (last 3 results) Recent Labs    08/24/18 1344  BNP 76.7    ProBNP (last 3 results) No results for input(s): PROBNP in the last 8760 hours.  CBG: Recent Labs  Lab 01/02/19 1247 01/02/19 1559  01/02/19 2148 01/03/19 0754 01/03/19 1129  GLUCAP 281* 330* 238* 163* 273*       Signed:  Alma Friendly, MD [facility] 01/03/2019, 12:13 PM

## 2019-01-03 NOTE — Progress Notes (Signed)
Physical Therapy Treatment Patient Details Name: Brittany Porter MRN: [id number] DOB: 1945/07/04 Today's Date: 01/03/2019    History of Present Illness 73 year old female with history of bilateral breast cancer/bilateral mastectomy with bone, liver and lung mets currently on Xeloda, ovarian and endometrial cancer, IDDM-2, HTN, A. fib on Xarelto, chronic respiratory failure on 2 to 4 L, and chronic pain on occasional NSAIDs presenting with generalized weakness, DOE and palpitation, and admitted 12/28/18 for severe symptomatic anemia with hemoglobin of 2.7.     PT Comments    Pt getting ready for d/c home on entry however willing to participate in therapy. Pt continues to be limited in safe mobility principally by decreased balance especially with initial standing. Pt requires modAx2 for steadying while pt positions her feet under herself to balance. Once in standing pt able to ambulate 40 feet with RW and min A and close chair follow. Pt would be safest with SNF level rehab, however she is adamant about going home in which case she will need a BSC. Pt educated on use of gait belt for family to assist in her safe mobility. Pt is confident that she will be okay with the assistance of her 68 year old grandson who is participating in remote learning and is at home 24 hours per day.    Follow Up Recommendations  Home health PT     Equipment Recommendations  3in1 (PT)       Precautions / Restrictions Precautions Precautions: Fall Precaution Comments: moniter BP Restrictions Weight Bearing Restrictions: No    Mobility  Bed Mobility               General bed mobility comments: pt in recliner upon arrival  Transfers Overall transfer level: Needs assistance Equipment used: Rolling walker (2 wheeled) Transfers: Sit to/from Stand Sit to Stand: Mod assist;Min assist         General transfer comment: mod A +2 initially to stand from recliner with RW- Verbal and tactile cues for body  mehanics during transfers  Ambulation/Gait Ambulation/Gait assistance: Min assist;+2 safety/equipment Gait Distance (Feet): 40 Feet Assistive device: Rolling walker (2 wheeled) Gait Pattern/deviations: Step-to pattern;Decreased step length - right;Decreased step length - left;Shuffle Gait velocity: slowed Gait velocity interpretation: <1.31 ft/sec, indicative of household ambulator General Gait Details: minAx2 for steadying with slow, shuffling steps with close chair follow, vc for proximity to RW and for upright posture and wider BoS       Balance Overall balance assessment: Needs assistance Sitting-balance support: Feet supported;Bilateral upper extremity supported Sitting balance-Leahy Scale: Fair     Standing balance support: Bilateral upper extremity supported;Single extremity supported;During functional activity Standing balance-Leahy Scale: Poor                              Cognition Arousal/Alertness: Awake/alert Behavior During Therapy: WFL for tasks assessed/performed Overall Cognitive Status: Within Functional Limits for tasks assessed                                           General Comments General comments (skin integrity, edema, etc.): VSS      Pertinent Vitals/Pain Pain Assessment: No/denies pain           PT Goals (current goals can now be found in the care plan section) Acute Rehab PT Goals PT Goal Formulation: With patient Time  For Goal Achievement: 01/14/19 Potential to Achieve Goals: Fair Progress towards PT goals: Progressing toward goals    Frequency    Min 2X/week      PT Plan Current plan remains appropriate    Co-evaluation PT/OT/SLP Co-Evaluation/Treatment: Yes Reason for Co-Treatment: For patient/therapist safety PT goals addressed during session: Mobility/safety with mobility;Balance OT goals addressed during session: ADL's and self-care      AM-PAC PT "6 Clicks" Mobility   Outcome Measure   Help needed turning from your back to your side while in a flat bed without using bedrails?: A Little Help needed moving from lying on your back to sitting on the side of a flat bed without using bedrails?: A Lot Help needed moving to and from a bed to a chair (including a wheelchair)?: A Little Help needed standing up from a chair using your arms (e.g., wheelchair or bedside chair)?: A Little Help needed to walk in hospital room?: A Little Help needed climbing 3-5 steps with a railing? : A Lot 6 Click Score: 16    End of Session Equipment Utilized During Treatment: Gait belt Activity Tolerance: Patient tolerated treatment well Patient left: in chair;with call bell/phone within reach Nurse Communication: Mobility status PT Visit Diagnosis: Unsteadiness on feet (R26.81);Other abnormalities of gait and mobility (R26.89);Repeated falls (R29.6);Muscle weakness (generalized) (M62.81);History of falling (Z91.81);Difficulty in walking, not elsewhere classified (R26.2)     Time: 4360-6770 PT Time Calculation (min) (ACUTE ONLY): 22 min  Charges:  $Gait Training: 8-22 mins                     Keli Buehner B. Migdalia Dk PT, DPT Acute Rehabilitation Services Pager [phone] Office [phone]    [place] 01/03/2019, 3:36 PM

## 2019-01-03 NOTE — TOC Transition Note (Signed)
Transition of Care [facility]) - CM/SW Discharge Note   Patient Details  Name: CHARNIKA HERBST MRN: [id number] Date of Birth: 1945-07-28  [facility]) CM/SW Contact:  Eileen Stanford, LCSW Phone Number: 01/03/2019, 2:40 PM   Clinical Narrative:   Pt is set up with [facility]. [employer] to deliever 3n1 at bedside prior to pt d/c.     Final next level of care: [place] Barriers to Discharge: Equipment Delay   Patient Goals and CMS Choice Patient states their goals for this hospitalization and ongoing recovery are:: "get better" CMS Medicare.gov Compare Post Acute Care list provided to:: Patient Choice offered to / list presented to : Adult Children  Discharge Placement                       Discharge Plan and Services In-house Referral: NA   Post Acute Care Choice: Home Health          DME Arranged: 3-N-1 DME Agency: [employer] Date DME Agency Contacted: 01/03/19 Time DME Agency Contacted: [phone] Representative spoke with at DME Agency: [place]: PT, OT, Nurse's Aide [place] Agency: [facility] (formerly [employer]) Date [place]: 01/02/19 Time [place]: [place] Representative spoke with at [place]: [place] ([place]) Interventions     Readmission Risk Interventions No flowsheet data found.

## 2019-01-04 LAB — TYPE AND SCREEN
ABO/RH(D): O POS
Antibody Screen: NEGATIVE
Donor AG Type: NEGATIVE
Donor AG Type: NEGATIVE
Unit division: 0
Unit division: 0

## 2019-01-04 LAB — BPAM RBC
Blood Product Expiration Date: 202009112359
Blood Product Expiration Date: 202009122359
ISSUE DATE / TIME: 202008181704
Unit Type and Rh: 5100
Unit Type and Rh: 5100

## 2019-01-07 ENCOUNTER — Telehealth: Payer: Self-pay | Admitting: Pulmonary Disease

## 2019-01-07 NOTE — Telephone Encounter (Signed)
Checking on status of this PFT? Supposed to be scheduled around Sept 1, 2020 any update?

## 2019-01-09 NOTE — Progress Notes (Signed)
Patient Care Team: Brittany Ada, MD as PCP - General (Family Medicine)  DIAGNOSIS:    ICD-10-CM   1. Carcinoma of breast metastatic to bone, unspecified laterality ([place])  C50.919 capecitabine (XELODA) 500 MG tablet   C79.51 CBC with Differential (Cancer Center Only)    CMP ([place] only)    SUMMARY OF ONCOLOGIC HISTORY: Oncology History  Breast cancer metastasized to liver [facility])  1997 Initial Diagnosis   bilateral breast cancers 1997 treated with bilateral mastectomies and axillary node dissections, adjuvant chemotherapy and radiation.   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 BRCA reportedly normal.   08/25/2015 Relapse/Recurrence   new solitary liver lesion, biopsy showing metastatic ER PR + breast   09/25/2015 - 03/11/2016 Chemotherapy   Xeloda. Stopped due to progression of liver and bone metastases; pleural metastases   03/31/2016 - 07/19/2018 Anti-estrogen oral therapy   Foslodex started 03/31/16, currently monthly  Ibrance 159m 3 weeks on, 1 week off started 03/31/16 Xgeva started on 05/05/16 Letrozole daily added 08/01/17    03/20/2018 Genetic Testing   Genetic testing performed through [facility]'s Multi-Cancer reported out on 03/19/2018 showed no pathogenic mutations. The Multi-Cancer Panel offered by [facility] includes sequencing and/or deletion duplication testing of the following 91 genes: AIP, ALK, APC, ATM, AXIN2, BAP1, BARD1, BLM, BMPR1A, BRCA1, BRCA2, BRIP1, BUB1B, CASR, CDC73, CDH1, CDK4, CDKN1B, CDKN1C, CDKN2A, CEBPA, CEP57, CHEK2, CTNNA1, DICER1, DIS3L2, EGFR, ENG, EPCAM, FH, FLCN, GALNT12, GATA2, GPC3, GREM1, HOXB13, HRAS, KIT, MAX, MEN1, MET, MITF, MLH1, MLH3, MSH2, MSH3, MSH6, MUTYH, NBN,  NF1, NF2, NTHL1, PALB2, PDGFRA, PHOX2B, PMS2, POLD1, POLE, POT1, PRKAR1A, PTCH1, PTEN, RAD50, RAD51C, RAD51D, RB1, RECQL4, RET, RNF43, RPS20, RUNX1, SDHA, SDHAF2, SDHB, SDHC, SDHD, SMAD4, SMARCA4, SMARCB1, SMARCE1, STK11, SUFU, TERC, TERT, TMEM127, TP53, TSC1, TSC2, VHL, WRN, WT1  A variant of uncertain significance (VUS) in a gene called MET was also noted. c.[id number] (p.Thr557Ala)   04/08/2018 Miscellaneous   [facility] molecular testing: Androgen receptor positive, PDL 1 neg, MSI stable, NTRK1/2/3Neg, T MBB intermediate 9 mutations/Mb, ESR 1-, PI 3 CA negative, BRCA negative   07/20/2018 - 08/30/2018 Anti-estrogen oral therapy   Exemestane with everolimus.  Discontinued due to severe hyperglycemia   09/24/2018 -  Chemotherapy   [place] day 1 day 8 every 3 weeks    Carcinoma of breast metastatic to bone ([facility]  03/27/2016 Initial Diagnosis   [place]arcin[place] of breast metastatic to bone ([facility]   03/31/2016 -  Chemotherapy   Foslodex started 03/31/16, currently monthly  Ibrance 1276m3 weeks on, 1 week off started 03/31/16 Daily letrozole added 08/01/17    09/26/2018 - 09/26/2018 Chemotherapy   The patient had eriBULin mesylate (HALAVEN) 2.8 mg in sodium chloride 0.9 % 100 mL chemo infusion, 1.4 mg/m2 = 2.8 mg, Intravenous,  Once, 0 of 4 cycles  for chemotherapy treatment.    [place] cancer [place]as[place] to liver, [place]ality [place]  03/27/2016 Initial Diagnosis   Bre[place] cancer [place] liver, [place]ality [place]    CHIEF COMPLIANT: Follow-up of metastatic breast cancer on Xeloda  INTERVAL HISTORY: Brittany Porter a [age].o. with above-mentioned history of metastatic breast cancerwith metastases to thebone,liver,and lungswhois currently on treatment withXeloda, Xgeva. [facility]  after presenting to the ED for nausea, vomiting, and cognitive decline. Hg was 2.6 and she was given 4 units of PRBCs. CXR showed left pleural  effusion and she was given 5 days of ceftriaxone. She presents to the clinic today for follow-up of her hospitalization to discuss reinitiation of Xeloda.  Patient is feeling much better since she left the hospital but her energy levels are still low and there is still profound weakness in her lower extremities.  She is getting physical therapy at home and is slowly improving.  She denies any fevers or chills. She is in the clinic today in a wheelchair and still having some difficulty with her legs.  REVIEW OF SYSTEMS:   Constitutional: Denies fevers, chills or abnormal weight loss Eyes: Denies blurriness of vision Ears, nose, mouth, throat, and face: Denies mucositis or sore throat Respiratory: Denies cough, dyspnea or wheezes Cardiovascular: Denies palpitation, chest discomfort Gastrointestinal: Denies nausea, heartburn or change in bowel habits Skin: Denies abnormal skin rashes Lymphatics: Denies new lymphadenopathy or easy bruising Neurological: Denies numbness, tingling, generalized lower extremity weakness is present Behavioral/Psych: Mood is stable, no new changes  Extremities: No lower extremity edema Breast: denies any pain or lumps or nodules in either breasts All other systems were reviewed with the patient and are negative.  I have reviewed the past medical history, past surgical history, social history and family history with the patient and they are unchanged from previous note.  ALLERGIES:  is allergic to prednisolone; prednisone; and sulfa antibiotics.  MEDICATIONS:  Current Outpatient Medications  Medication Sig Dispense Refill  . amiodarone (PACERONE) 200 MG tablet Take 1 tablet (200 mg total) by mouth daily.    Marland Kitchen atorvastatin (LIPITOR) 20 MG tablet Take 1 tablet (20 mg total) by mouth daily at 6 PM. 30 tablet 0  . Calcium Carb-Cholecalciferol (CALCIUM 600+D3) 600-800 MG-UNIT TABS Take 1 tablet by mouth 2 (two) times daily.     . capecitabine (XELODA) 500 MG tablet Take  2 tablets (1,000 mg total) by mouth 2 (two) times daily after a meal. Take for 14 days on, 7 days off, repeat every 21 days 84 tablet 3  . Cholecalciferol (VITAMIN D3) 2000 UNITS TABS Take 2,000 mg by mouth daily.    . ferrous sulfate 325 (65 FE) MG tablet Take 325 mg by mouth daily as needed (energy).     . fish oil-omega-3 fatty acids 1000 MG capsule Take 1 g by mouth 2 (two) times daily.     . furosemide (LASIX) 20 MG tablet Take 1 tablet (20 mg total) by mouth daily. Reported on 05/07/2015 30 tablet 0  . gabapentin (NEURONTIN) 100 MG capsule Take 100 mg by mouth 2 (two) times daily.    . Ipratropium-Albuterol (COMBIVENT RESPIMAT) 20-100 MCG/ACT AERS respimat Inhale 1 puff into the lungs every 6 (six) hours. (Patient taking differently: Inhale 1 puff into the lungs every 6 (six) hours as needed for wheezing or shortness of breath. ) 4 g 6  . Magnesium Oxide (MAG-OXIDE) 200 MG TABS Take 1 tablet (200 mg total) by mouth daily. (Patient taking differently: Take 200 mg by mouth at bedtime. ) 30 tablet 0  . metFORMIN (GLUCOPHAGE) 500 MG tablet Take 2 tablets (1,000 mg total) by mouth 2 (two) times daily with a meal. 120 tablet 0  . metoprolol succinate (TOPROL-XL) 25 MG 24 hr tablet Take 1 tablet (25 mg total) by mouth daily. 90 tablet 3  . OXYGEN Inhale 4 L into the lungs continuous.     Marland Kitchen  pantoprazole (PROTONIX) 40 MG tablet Take 1 tablet (40 mg total) by mouth 2 (two) times daily before a meal. 60 tablet 0  . sodium chloride (OCEAN) 0.65 % SOLN nasal spray Place 1 spray into both nostrils as needed for congestion.     No current facility-administered medications for this visit.     PHYSICAL EXAMINATION: ECOG PERFORMANCE STATUS: 3 - Symptomatic, >50% confined to bed  Vitals:   01/10/19 1106  BP: (!) 125/99  Pulse: 84  Resp: 16  Temp: 99.1 F (37.3 C)  SpO2: 95%   Filed Weights   01/10/19 1106  Weight: 186 lb 11.2 oz (84.7 kg)    GENERAL: alert, no distress and comfortable SKIN:  skin color, texture, turgor are normal, no rashes or significant lesions EYES: normal, Conjunctiva are pink and non-injected, sclera clear OROPHARYNX: no exudate, no erythema and lips, buccal mucosa, and tongue normal  NECK: supple, thyroid normal size, non-tender, without nodularity LYMPH: no palpable lymphadenopathy in the cervical, axillary or inguinal LUNGS: clear to auscultation and percussion with normal breathing effort HEART: regular rate & rhythm and no murmurs and no lower extremity edema ABDOMEN: abdomen soft, non-tender and normal bowel sounds MUSCULOSKELETAL: no cyanosis of digits and no clubbing  NEURO: alert & oriented x 3 with fluent speech, no focal motor/sensory deficits EXTREMITIES: No lower extremity edema  LABORATORY DATA:  I have reviewed the data as listed CMP Latest Ref Rng & Units 01/10/2019 01/03/2019 01/02/2019  Glucose 70 - 99 mg/dL 206(H) 169(H) 137(H)  BUN 8 - 23 mg/dL _0 Creatinine 0.44 - 1.00 mg/dL 0.83 1.08(H) 0.94  Sodium 135 - 145 mmol/L 139 137 139  Potassium 3.5 - 5.1 mmol/L 3.9 3.6 3.5  Chloride 98 - 111 mmol/L 102 98 100  CO2 22 - 32 mmol/L _1 Calcium 8.9 - 10.3 mg/dL 7.9(L) 7.1(L) 6.6(L)  Total Protein 6.5 - 8.1 g/dL 5.3(L) - -  Total Bilirubin 0.3 - 1.2 mg/dL 0.4 - -  Alkaline Phos 38 - 126 U/L 62 - -  AST 15 - 41 U/L 18 - -  ALT 0 - 44 U/L 8 - -    Lab Results  Component Value Date   WBC 4.7 01/10/2019   HGB 9.3 (L) 01/10/2019   HCT 30.3 (L) 01/10/2019   MCV 98.7 01/10/2019   PLT 202 01/10/2019   NEUTROABS 3.1 01/10/2019    ASSESSMENT & PLAN:  Carcinoma of breast metastatic to bone [facility]) Metastatic breast cancer: recently progressive in liver and bone, also pleura.  Faslodex + Ibrance beginning 03/31/2016-March 2020 stopped due to progression Exemestane with everolimus discontinued 08/29/2018 due to hyperglycemia  [facility] molecular testing: Androgen receptor positive, PDL 1 neg, MSI stable, NTRK1/2/3Neg, T MBB  intermediate 9 mutations/Mb, ESR 1-, PI 3 CA negative, BRCA negative  Current treatment:Xeloda with Xgeva Xeloda toxicities: 1.  Hospitalization for severe anemia hemoglobin of 2.6 received 4 units of PRBC: Today's blood work showed improved hemoglobin of 9.2 g. We will reduce the dosage of Xeloda to 1000 mg p.o. twice daily 2 weeks on 1 week off and see if she tolerates this any better. 3.  Severe generalized weakness related to recent hospitalization for anemia.  Continuing her physical therapy.  Return to clinic in 1 month with labs and follow-up.    Orders Placed This Encounter  Procedures  . CBC with Differential (Cancer Center Only)    Standing Status:   Future    Standing Expiration Date:  01/10/2020  . CMP ([place] only)    Standing Status:   Future    Standing Expiration Date:   01/10/2020   The patient has a good understanding of the overall plan. she agrees with it. she will call with any problems that may develop before the next visit here.  Nicholas Lose, MD 01/10/2019  Julious Oka Dorshimer am acting as scribe for Dr. Nicholas Lose.  I have reviewed the above documentation for accuracy and completeness, and I agree with the above.

## 2019-01-10 ENCOUNTER — Inpatient Hospital Stay (HOSPITAL_BASED_OUTPATIENT_CLINIC_OR_DEPARTMENT_OTHER): Payer: Medicare Other | Admitting: Hematology and Oncology

## 2019-01-10 ENCOUNTER — Other Ambulatory Visit: Payer: Self-pay

## 2019-01-10 ENCOUNTER — Inpatient Hospital Stay: Payer: Medicare Other | Attending: Hematology and Oncology

## 2019-01-10 ENCOUNTER — Inpatient Hospital Stay: Payer: Medicare Other

## 2019-01-10 DIAGNOSIS — C7951 Secondary malignant neoplasm of bone: Secondary | ICD-10-CM

## 2019-01-10 DIAGNOSIS — D649 Anemia, unspecified: Secondary | ICD-10-CM | POA: Diagnosis not present

## 2019-01-10 DIAGNOSIS — Z79899 Other long term (current) drug therapy: Secondary | ICD-10-CM | POA: Diagnosis not present

## 2019-01-10 DIAGNOSIS — C50919 Malignant neoplasm of unspecified site of unspecified female breast: Secondary | ICD-10-CM | POA: Insufficient documentation

## 2019-01-10 DIAGNOSIS — C787 Secondary malignant neoplasm of liver and intrahepatic bile duct: Secondary | ICD-10-CM | POA: Insufficient documentation

## 2019-01-10 DIAGNOSIS — C782 Secondary malignant neoplasm of pleura: Secondary | ICD-10-CM | POA: Diagnosis not present

## 2019-01-10 DIAGNOSIS — Z5112 Encounter for antineoplastic immunotherapy: Secondary | ICD-10-CM | POA: Insufficient documentation

## 2019-01-10 DIAGNOSIS — Z7189 Other specified counseling: Secondary | ICD-10-CM

## 2019-01-10 DIAGNOSIS — Z7984 Long term (current) use of oral hypoglycemic drugs: Secondary | ICD-10-CM | POA: Diagnosis not present

## 2019-01-10 DIAGNOSIS — Z9013 Acquired absence of bilateral breasts and nipples: Secondary | ICD-10-CM | POA: Diagnosis not present

## 2019-01-10 LAB — CBC WITH DIFFERENTIAL (CANCER CENTER ONLY)
Abs Immature Granulocytes: 0.01 10*3/uL (ref 0.00–0.07)
Basophils Absolute: 0 10*3/uL (ref 0.0–0.1)
Basophils Relative: 1 %
Eosinophils Absolute: 0.1 10*3/uL (ref 0.0–0.5)
Eosinophils Relative: 2 %
HCT: 30.3 % — ABNORMAL LOW (ref 36.0–46.0)
Hemoglobin: 9.3 g/dL — ABNORMAL LOW (ref 12.0–15.0)
Immature Granulocytes: 0 %
Lymphocytes Relative: 22 %
Lymphs Abs: 1 10*3/uL (ref 0.7–4.0)
MCH: 30.3 pg (ref 26.0–34.0)
MCHC: 30.7 g/dL (ref 30.0–36.0)
MCV: 98.7 fL (ref 80.0–100.0)
Monocytes Absolute: 0.4 10*3/uL (ref 0.1–1.0)
Monocytes Relative: 8 %
Neutro Abs: 3.1 10*3/uL (ref 1.7–7.7)
Neutrophils Relative %: 67 %
Platelet Count: 202 10*3/uL (ref 150–400)
RBC: 3.07 MIL/uL — ABNORMAL LOW (ref 3.87–5.11)
RDW: 20.3 % — ABNORMAL HIGH (ref 11.5–15.5)
WBC Count: 4.7 10*3/uL (ref 4.0–10.5)
nRBC: 0 % (ref 0.0–0.2)

## 2019-01-10 LAB — CMP (CANCER CENTER ONLY)
ALT: 8 U/L (ref 0–44)
AST: 18 U/L (ref 15–41)
Albumin: 2.2 g/dL — ABNORMAL LOW (ref 3.5–5.0)
Alkaline Phosphatase: 62 U/L (ref 38–126)
Anion gap: 9 (ref 5–15)
BUN: 15 mg/dL (ref 8–23)
CO2: 28 mmol/L (ref 22–32)
Calcium: 7.9 mg/dL — ABNORMAL LOW (ref 8.9–10.3)
Chloride: 102 mmol/L (ref 98–111)
Creatinine: 0.83 mg/dL (ref 0.44–1.00)
GFR, Est AFR Am: >60 mL/min (ref >60)
GFR, Estimated: >60 mL/min (ref >60)
Glucose, Bld: 206 mg/dL — ABNORMAL HIGH (ref 70–99)
Potassium: 3.9 mmol/L (ref 3.5–5.1)
Sodium: 139 mmol/L (ref 135–145)
Total Bilirubin: 0.4 mg/dL (ref 0.3–1.2)
Total Protein: 5.3 g/dL — ABNORMAL LOW (ref 6.5–8.1)

## 2019-01-10 MED ORDER — DENOSUMAB 120 MG/1.7ML ~~LOC~~ SOLN
120.0000 mg | Freq: Once | SUBCUTANEOUS | Status: AC
  Administered 2019-01-10: 12:00:00 120 mg via SUBCUTANEOUS

## 2019-01-10 MED ORDER — CAPECITABINE 500 MG PO TABS: 1000.0000 mg | ORAL_TABLET | Freq: Two times a day (BID) | ORAL | 3 refills | Status: DC

## 2019-01-10 MED ORDER — DENOSUMAB 120 MG/1.7ML ~~LOC~~ SOLN
SUBCUTANEOUS | Status: AC
  Filled 2019-01-10: qty 1.7

## 2019-01-10 NOTE — Patient Instructions (Signed)
Denosumab injection What is this medicine? DENOSUMAB (den oh sue mab) slows bone breakdown. Prolia is used to treat osteoporosis in women after menopause and in men, and in people who are taking corticosteroids for 6 months or more. Xgeva is used to treat a high calcium level due to cancer and to prevent bone fractures and other bone problems caused by multiple myeloma or cancer bone metastases. Xgeva is also used to treat giant cell tumor of the bone. This medicine may be used for other purposes; ask your health care provider or pharmacist if you have questions. COMMON BRAND NAME(S): Prolia, XGEVA What should I tell my health care provider before I take this medicine? They need to know if you have any of these conditions:  dental disease  having surgery or tooth extraction  infection  kidney disease  low levels of calcium or Vitamin D in the blood  malnutrition  on hemodialysis  skin conditions or sensitivity  thyroid or parathyroid disease  an unusual reaction to denosumab, other medicines, foods, dyes, or preservatives  pregnant or trying to get pregnant  breast-feeding How should I use this medicine? This medicine is for injection under the skin. It is given by a health care professional in a hospital or clinic setting. A special MedGuide will be given to you before each treatment. Be sure to read this information carefully each time. For Prolia, talk to your pediatrician regarding the use of this medicine in children. Special care may be needed. For Xgeva, talk to your pediatrician regarding the use of this medicine in children. While this drug may be prescribed for children as young as 13 years for selected conditions, precautions do apply. Overdosage: If you think you have taken too much of this medicine contact a poison control center or emergency room at once. NOTE: This medicine is only for you. Do not share this medicine with others. What if I miss a dose? It is  important not to miss your dose. Call your doctor or health care professional if you are unable to keep an appointment. What may interact with this medicine? Do not take this medicine with any of the following medications:  other medicines containing denosumab This medicine may also interact with the following medications:  medicines that lower your chance of fighting infection  steroid medicines like prednisone or cortisone This list may not describe all possible interactions. Give your health care provider a list of all the medicines, herbs, non-prescription drugs, or dietary supplements you use. Also tell them if you smoke, drink alcohol, or use illegal drugs. Some items may interact with your medicine. What should I watch for while using this medicine? Visit your doctor or health care professional for regular checks on your progress. Your doctor or health care professional may order blood tests and other tests to see how you are doing. Call your doctor or health care professional for advice if you get a fever, chills or sore throat, or other symptoms of a cold or flu. Do not treat yourself. This drug may decrease your body's ability to fight infection. Try to avoid being around people who are sick. You should make sure you get enough calcium and vitamin D while you are taking this medicine, unless your doctor tells you not to. Discuss the foods you eat and the vitamins you take with your health care professional. See your dentist regularly. Brush and floss your teeth as directed. Before you have any dental work done, tell your dentist you are   receiving this medicine. Do not become pregnant while taking this medicine or for 5 months after stopping it. Talk with your doctor or health care professional about your birth control options while taking this medicine. Women should inform their doctor if they wish to become pregnant or think they might be pregnant. There is a potential for serious side  effects to an unborn child. Talk to your health care professional or pharmacist for more information. What side effects may I notice from receiving this medicine? Side effects that you should report to your doctor or health care professional as soon as possible:  allergic reactions like skin rash, itching or hives, swelling of the face, lips, or tongue  bone pain  breathing problems  dizziness  jaw pain, especially after dental work  redness, blistering, peeling of the skin  signs and symptoms of infection like fever or chills; cough; sore throat; pain or trouble passing urine  signs of low calcium like fast heartbeat, muscle cramps or muscle pain; pain, tingling, numbness in the hands or feet; seizures  unusual bleeding or bruising  unusually weak or tired Side effects that usually do not require medical attention (report to your doctor or health care professional if they continue or are bothersome):  constipation  diarrhea  headache  joint pain  loss of appetite  muscle pain  runny nose  tiredness  upset stomach This list may not describe all possible side effects. Call your doctor for medical advice about side effects. You may report side effects to FDA at 1-800-FDA-1088. Where should I keep my medicine? This medicine is only given in a clinic, doctor's office, or other health care setting and will not be stored at home. NOTE: This sheet is a summary. It may not cover all possible information. If you have questions about this medicine, talk to your doctor, pharmacist, or health care provider.  2020 Elsevier/Gold Standard (2017-09-08 16:10:44)

## 2019-01-10 NOTE — Assessment & Plan Note (Signed)
Metastatic breast cancer: recently progressive in liver and bone, also pleura.  Faslodex + Ibrance beginning 03/31/2016-March 2020 stopped due to progression Exemestane with everolimus discontinued 08/29/2018 due to hyperglycemia  [facility] molecular testing: Androgen receptor positive, PDL 1 neg, MSI stable, NTRK1/2/3Neg, T MBB intermediate 9 mutations/Mb, ESR 1-, PI 3 CA negative, BRCA negative  Current treatment:Xeloda with Xgeva Xeloda toxicities: 1.  Hospitalization for severe anemia hemoglobin of 2.6 received 4 units of PRBC: Today's blood work showed improved hemoglobin of 9.2 g. We will reduce the dosage of Xeloda to 1000 mg p.o. twice daily 2 weeks on 1 week off and see if she tolerates this any better. 3.  Severe generalized weakness related to recent hospitalization for anemia.  Continuing her physical therapy.  Return to clinic in 1 month with labs and follow-up.

## 2019-01-10 NOTE — Progress Notes (Signed)
Pharmacy and MD Lindi Adie aware of pt's calcium levels.  Pt has calcium-vit D supplement at home and verbalized understanding that she needs to take it consistently to assist with hypocalcemia.

## 2019-01-10 NOTE — Telephone Encounter (Signed)
Called scheduled pft on 02/04/2019-pr

## 2019-01-11 ENCOUNTER — Telehealth: Payer: Self-pay | Admitting: Hematology and Oncology

## 2019-01-11 NOTE — Telephone Encounter (Signed)
I talk with patient regarding schedule  

## 2019-01-14 ENCOUNTER — Telehealth: Payer: Self-pay | Admitting: Pharmacist

## 2019-01-14 ENCOUNTER — Other Ambulatory Visit: Payer: Self-pay | Admitting: Hematology and Oncology

## 2019-01-14 DIAGNOSIS — C50919 Malignant neoplasm of unspecified site of unspecified female breast: Secondary | ICD-10-CM

## 2019-01-14 DIAGNOSIS — C7951 Secondary malignant neoplasm of bone: Secondary | ICD-10-CM

## 2019-01-14 MED ORDER — CAPECITABINE 500 MG PO TABS: 1000.0000 mg | ORAL_TABLET | Freq: Two times a day (BID) | ORAL | 3 refills | Status: DC

## 2019-01-14 NOTE — Telephone Encounter (Signed)
Oral Oncology Pharmacist Encounter  Noted dose reduction of Xeloda (capcitabine) for increased tolerance per MD note from 01/10/19  New prescription for Xeloda 500 mg tablets, take 2 tablets (1000 mg) by mouth 2 times daily after a meal for 14 days on, 7 days off, repeat every 21 days, quantity #56, refills = 3, has been e-scribed to the [employer].  [name], PharmD, BCPS, BCOP  01/14/2019 10:54 AM Oral Oncology Clinic [phone]

## 2019-01-15 MED FILL — CAPECITABINE 500 MG TABS: 500 | 21 days supply | Qty: 56 | Fill #0

## 2019-01-15 NOTE — Telephone Encounter (Signed)
Oral Oncology Patient Advocate Encounter  This grant closed due to not being used.  She has a [employer] and PAF grant that will cover her copays until Jan. 2021.  [place] Patient [place] Phone [phone] Fax [phone] 01/15/2019   8:30 AM

## 2019-01-23 ENCOUNTER — Other Ambulatory Visit: Payer: Self-pay

## 2019-01-23 MED ORDER — AMIODARONE HCL 200 MG PO TABS: 200.0000 mg | ORAL_TABLET | Freq: Every day | ORAL | 0 refills | Status: DC

## 2019-01-25 ENCOUNTER — Other Ambulatory Visit: Payer: Self-pay | Admitting: Pulmonary Disease

## 2019-01-29 ENCOUNTER — Ambulatory Visit: Payer: Medicare Other | Admitting: Physician Assistant

## 2019-01-30 ENCOUNTER — Other Ambulatory Visit: Payer: Self-pay

## 2019-01-31 ENCOUNTER — Other Ambulatory Visit: Payer: Self-pay

## 2019-01-31 MED ORDER — ATORVASTATIN CALCIUM 20 MG PO TABS: 20.0000 mg | ORAL_TABLET | Freq: Every day | ORAL | 1 refills | Status: DC

## 2019-01-31 MED FILL — CAPECITABINE 500 MG TABS: 500 | 21 days supply | Qty: 56 | Fill #1

## 2019-02-01 ENCOUNTER — Other Ambulatory Visit (HOSPITAL_COMMUNITY)
Admission: RE | Admit: 2019-02-01 | Discharge: 2019-02-01 | Disposition: A | Discharge status: Home / Self Care | Payer: Medicare Other | Source: Ambulatory Visit | Attending: Pulmonary Disease | Admitting: Pulmonary Disease

## 2019-02-01 DIAGNOSIS — Z01812 Encounter for preprocedural laboratory examination: Secondary | ICD-10-CM | POA: Diagnosis present

## 2019-02-01 DIAGNOSIS — Z20828 Contact with and (suspected) exposure to other viral communicable diseases: Secondary | ICD-10-CM | POA: Diagnosis not present

## 2019-02-01 LAB — SARS CORONAVIRUS 2 (TAT 6-24 HRS): SARS Coronavirus 2: NEGATIVE

## 2019-02-03 NOTE — Progress Notes (Signed)
Virtual Visit via Telephone Note   This visit type was conducted due to national recommendations for restrictions regarding the COVID-19 Pandemic (e.g. social distancing) in an effort to limit this patient's exposure and mitigate transmission in our community.  Due to her co-morbid illnesses, this patient is at least at moderate risk for complications without adequate follow up.  This format is felt to be most appropriate for this patient at this time.  The patient did not have access to video technology/had technical difficulties with video requiring transitioning to audio format only (telephone).  All issues noted in this document were discussed and addressed.  No physical exam could be performed with this format.  Please refer to the patient's chart for her  consent to telehealth for [facility].   Date:  02/03/2019   ID:  Brittany Porter, DOB Jan 04, 1946, MRN [id number]  Patient Location: Home Provider Location: Home  PCP:  [name], MD  Cardiologist:  Dr.Acharya  Electrophysiologist:  None   Evaluation Performed:  Follow-Up Visit  Chief Complaint:  Hospital Follow Up   History of Present Illness:    Brittany Porter is a 73 y.o. female with we are following status post hospitalization, after admission for generalized weakness, palpitations, severe symptomatic anemia with a hemoglobin of 2.7, without complaints of melena or hematochezia.  She received 4 units of PRBCs, she was also found to have pneumonia and was started on broad-spectrum antibiotics.  Cardiology follows her for atrial fib CHADS VASC Score of 6, and she was discontinued off Xarelto after consultation with cardiology.  (No official note).  She also had exacerbation of diastolic CHF in the setting of severe anemia.  She was to continue home Lasix 20 mg daily.  The patient also has a history of hypertension, insulin-dependent diabetes type 2, and oxygen dependent respiratory failure on 2 to 4 L of oxygen via nasal cannula  She was discharged on 01/03/2019.  She has a history of bilateral breast cancer status post bilateral mastectomy with bone, liver, and lung mets on Xeloda along with ovarian and endometrial cancer.  She is followed closely by oncology.    She is seen virtually today via telephone.  She has no cardiac complaints.  She has some complaints of arthritis in her right foot which is new for her as she normally feels it in her left foot.  She only wears oxygen at nighttime.  She checks her O2 sat during the day and at rest it is 97%.  She has not had any complaints of bleeding, chest pain, shortness of breath, or dizziness.  She continues to undergo physical therapy but may not be able to do so today due to the pain in her foot.  She is calling her PCP and or orthopedic physician to request a steroid injection which normally helps her.  She has had labs on 01/10/2019 with a hemoglobin of 9.3 hematocrit of 30.3 platelets 202, which were drawn by oncology.  They are seeing her monthly.  The patient does not have symptoms concerning for COVID-19 infection (fever, chills, cough, or new shortness of breath).    Past Medical History:  Diagnosis Date  . Arthritis   . Breast cancer ([place])   . Diabetes mellitus   . Endometrial carcinoma ([place]) 10/2008  . Family history of breast cancer   . Family history of colon cancer   . Family history of skin cancer   . Fluid overload   . History of radiation therapy 04/13/2009,  04/16/2009, 04/27/2009, 05/07/2009, 05/18/2009   3000 cGy to proximal vagina  . Hyperlipidemia   . Iron deficiency   . Melanoma ([place]) 2010   rt arm and back  . Ovarian cancer ([place])   . Pleural effusion 04/2010   Past Surgical History:  Procedure Laterality Date  . ABDOMINAL HYSTERECTOMY  10/2008  . APPENDECTOMY    . ESOPHAGOGASTRODUODENOSCOPY (EGD) WITH PROPOFOL N/A 12/30/2018   Procedure: ESOPHAGOGASTRODUODENOSCOPY (EGD) WITH PROPOFOL;  Surgeon: Ronald Lobo, MD;  Location: [place];   Service: Endoscopy;  Laterality: N/A;  . INSERTION OF MESH  05/28/2012   Procedure: INSERTION OF MESH;  Surgeon: Adin Hector, MD;  Location: [place];  Service: General;  Laterality: N/A;  . KNEE ARTHROSCOPY  04/2010   Right knee  . LAPAROSCOPIC LYSIS OF ADHESIONS  05/28/2012   Procedure: LAPAROSCOPIC LYSIS OF ADHESIONS;  Surgeon: Adin Hector, MD;  Location: [place];  Service: General;  Laterality: N/A;  . MASTECTOMY  1997   Bilateral with lymph nodes  . Melanoma removal  02/2009, 07/2009  . VENTRAL HERNIA REPAIR  05/28/2012   Procedure: LAPAROSCOPIC VENTRAL HERNIA;  Surgeon: Adin Hector, MD;  Location: [place];  Service: General;  Laterality: N/A;     No outpatient medications have been marked as taking for the 02/04/19 encounter (Appointment) with Lendon Colonel, NP.     Allergies:   Prednisolone, Prednisone, and Sulfa antibiotics   Social History   Tobacco Use  . Smoking status: Never Smoker  . Smokeless tobacco: Never Used  Substance Use Topics  . Alcohol use: No  . Drug use: No     Family Hx: The patient's family history includes ALS in her sister; Breast cancer in her sister; Cancer in her father and sister; Colon cancer in her sister; Colon cancer (age of onset: 65) in her sister; Dementia in her sister; Diabetes in her brother; Heart attack in her brother; Skin cancer (age of onset: 77) in her brother; Stroke in her brother, mother, and sister.  ROS:   Please see the history of present illness.    All other systems reviewed and are negative.   Prior CV studies:   The following studies were reviewed today:\ Echocardiogram 12/29/2018 1. The left ventricle has normal systolic function, with an ejection fraction of 55-60%. The cavity size was normal. There is mildly increased left ventricular wall thickness. Left ventricular diastolic Doppler parameters are consistent with  pseudonormalization. Elevated mean left atrial pressure.  2. The right ventricle has normal  systolic function. The cavity was normal.  3. Left atrial size was moderately dilated.  4. Trivial pericardial effusion is present.  5. The tricuspid valve is grossly normal.  6. The aortic valve is tricuspid. Mild thickening of the aortic valve. No stenosis of the aortic valve.  7. The aorta is normal unless otherwise noted.  8. Apical hypokinesis with overall preserved LV systolic function; moderate diastolic dysfunction; mild LVH; moderate LAE.  Labs/Other Tests and Data Reviewed:    EKG:  No ECG reviewed.  Recent Labs: 06/12/2018: Magnesium 2.1 08/24/2018: B Natriuretic Peptide 76.7 11/05/2018: TSH 2.122 01/10/2019: ALT 8; BUN 15; Creatinine 0.83; Hemoglobin 9.3; Platelet Count 202; Potassium 3.9; Sodium 139   Recent Lipid Panel Lab Results  Component Value Date/Time   CHOL 167 08/09/2018 11:14 AM   TRIG 161 (H) 08/09/2018 11:14 AM   HDL 61 08/09/2018 11:14 AM   CHOLHDL 2.7 08/09/2018 11:14 AM   LDLCALC 74 08/09/2018 11:14 AM  Wt Readings from Last 3 Encounters:  01/10/19 186 lb 11.2 oz (84.7 kg)  01/03/19 189 lb 9.5 oz (86 kg)  12/07/18 180 lb 1.6 oz (81.7 kg)     Objective:    Vital Signs:  There were no vitals taken for this visit.   VITAL SIGNS:  reviewed RESPIRATORY:  normal respiratory effort, symmetric expansion NEURO:  alert and oriented x 3, no obvious focal deficit PSYCH:  normal affect  ASSESSMENT & PLAN:    1. PAF: No further complaints of rapid heart rhythm or irregular heart rhythm.  She is no longer on anticoagulation therapy.  She continues on amiodarone 200 mg daily, and metoprolol 25 mg daily.  She denies any bleeding since being home.  She refuses any further anticoagulation.  She is aware of the risks involved, but with severe anemia she has chosen not to begin anticoagulation again.  I am honoring her choice.  2.  Hypertension: Blood pressure is currently well controlled.  No changes in her medication regimen at this time.  She denies any  dizziness or fatigue associated with blood pressure medications.  She is deconditioned and undergoes physical therapy.  3.  Severe anemia: Recent hospitalization requiring 4 units of packed red blood cells.  She is followed closely by oncology.  Most recent hemoglobin was 9.3 with hematocrit of 30.3 on 01/10/2019.  She sees oncology monthly.  4.  Bilateral breast cancer: Status post bilateral mastectomy with bone, liver, and lung mets on Xeloda along with ovarian and endometrial cancer.  Time:   Today, I have spent 15 minutes with the patient with telehealth technology discussing the above problems.    COVID-19 Education: The signs and symptoms of COVID-19 were discussed with the patient and how to seek care for testing (follow up with PCP or arrange E-visit).  The importance of social distancing was discussed today.  Medication Adjustments/Labs and Tests Ordered: Current medicines are reviewed at length with the patient today.  Concerns regarding medicines are outlined above.   Tests Ordered: No orders of the defined types were placed in this encounter.   Medication Changes: No orders of the defined types were placed in this encounter.   Disposition:  Follow up January 2021 with Dr. Margaretann Loveless   Signed, Phill Myron. West Pugh, ANP, AACC  02/03/2019 8:39 AM    [facility]

## 2019-02-04 ENCOUNTER — Telehealth (INDEPENDENT_AMBULATORY_CARE_PROVIDER_SITE_OTHER): Payer: Medicare Other | Admitting: Adult Health

## 2019-02-04 ENCOUNTER — Ambulatory Visit: Payer: Medicare Other | Admitting: Pulmonary Disease

## 2019-02-04 ENCOUNTER — Encounter: Payer: Self-pay | Admitting: Adult Health

## 2019-02-04 VITALS — BP 111/52 | HR 80 | Ht 63.5 in | Wt 178.0 lb

## 2019-02-04 DIAGNOSIS — I10 Essential (primary) hypertension: Secondary | ICD-10-CM

## 2019-02-04 DIAGNOSIS — C7951 Secondary malignant neoplasm of bone: Secondary | ICD-10-CM

## 2019-02-04 DIAGNOSIS — C50919 Malignant neoplasm of unspecified site of unspecified female breast: Secondary | ICD-10-CM

## 2019-02-04 DIAGNOSIS — I480 Paroxysmal atrial fibrillation: Secondary | ICD-10-CM

## 2019-02-04 DIAGNOSIS — D609 Acquired pure red cell aplasia, unspecified: Secondary | ICD-10-CM

## 2019-02-04 MED ORDER — PANTOPRAZOLE SODIUM 40 MG PO TBEC: 40.0000 mg | DELAYED_RELEASE_TABLET | Freq: Two times a day (BID) | ORAL | 6 refills | Status: DC

## 2019-02-04 NOTE — Patient Instructions (Signed)
Medication Instructions:  Continue current medications  If you need a refill on your cardiac medications before your next appointment, please call your pharmacy.  Labwork: None Ordered   Testing/Procedures: None Ordered   Follow-Up: You will need a follow up appointment in 4 months.  Please call our office 2 months in advance to schedule this appointment.  You may see Dr Margaretann Loveless or one of the following Advanced Practice Providers on your designated Care Team:   Rosaria Ferries, PA-C . Jory Sims, DNP, ANP     At [facility], you and your health needs are our priority.  As part of our continuing mission to provide you with exceptional heart care, we have created designated Provider Care Teams.  These Care Teams include your primary Cardiologist (physician) and Advanced Practice Providers (APPs -  Physician Assistants and Nurse Practitioners) who all work together to provide you with the care you need, when you need it.  Thank you for choosing [facility] at [facility]!!

## 2019-02-06 NOTE — Progress Notes (Signed)
Patient Care Team: [name], MD as PCP - General (Family Medicine)  DIAGNOSIS:    ICD-10-CM   1. Carcinoma of breast metastatic to bone, unspecified laterality ([name])  C50.919    C79.51     SUMMARY OF ONCOLOGIC HISTORY: Oncology History  Breast cancer metastasized to liver [facility])  1997 Initial Diagnosis   bilateral breast cancers 1997 treated with bilateral mastectomies and axillary node dissections, adjuvant chemotherapy and radiation.   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 BRCA reportedly normal.   08/25/2015 Relapse/Recurrence   new solitary liver lesion, biopsy showing metastatic ER PR + breast   09/25/2015 - 03/11/2016 Chemotherapy   Xeloda. Stopped due to progression of liver and bone metastases; pleural metastases   03/31/2016 - 07/19/2018 Anti-estrogen oral therapy   Foslodex started 03/31/16, currently monthly  Ibrance 120m 3 weeks on, 1 week off started 03/31/16 Xgeva started on 05/05/16 Letrozole daily added 08/01/17    03/20/2018 Genetic Testing   Genetic testing performed through [facility]'s Multi-Cancer reported out on 03/19/2018 showed no pathogenic mutations. The Multi-Cancer Panel offered by [facility] includes sequencing and/or deletion duplication testing of the following 91 genes: AIP, ALK, APC, ATM, AXIN2, BAP1, BARD1, BLM, BMPR1A, BRCA1, BRCA2, BRIP1, BUB1B, CASR, CDC73, CDH1, CDK4, CDKN1B, CDKN1C, CDKN2A, CEBPA, CEP57, CHEK2, CTNNA1, DICER1, DIS3L2, EGFR, ENG, EPCAM, FH, FLCN, GALNT12, GATA2, GPC3, GREM1, HOXB13, HRAS, KIT, MAX, MEN1, MET, MITF, MLH1, MLH3, MSH2, MSH3, MSH6, MUTYH, NBN, NF1, NF2, NTHL1, PALB2, PDGFRA, PHOX2B, PMS2, POLD1, POLE, POT1, PRKAR1A, PTCH1, PTEN, RAD50, RAD51C,  RAD51D, RB1, RECQL4, RET, RNF43, RPS20, RUNX1, SDHA, SDHAF2, SDHB, SDHC, SDHD, SMAD4, SMARCA4, SMARCB1, SMARCE1, STK11, SUFU, TERC, TERT, TMEM127, TP53, TSC1, TSC2, VHL, WRN, WT1  A variant of uncertain significance (VUS) in a gene called MET was also noted. [id number] (p.Thr557Ala)   04/08/2018 Miscellaneous   [facility] molecular testing: Androgen receptor positive, PDL 1 neg, MSI stable, NTRK1/2/3Neg, T MBB intermediate 9 mutations/Mb, ESR 1-, PI 3 CA negative, BRCA negative   07/20/2018 - 08/30/2018 Anti-estrogen oral therapy   Exemestane with everolimus.  Discontinued due to severe hyperglycemia   09/24/2018 -  Chemotherapy   [place] day 1 day 8 every 3 weeks    [place]  03/27/2016 Initial Diagnosis   [place]   08/03/2017 - 01/31/2018 Chemotherapy   Foslodex started 03/31/16 monthly  Ibrance 1233m3 weeks on, 1 week off started 03/31/16 Daily letrozole added 08/01/17    [place], [place]  03/27/2016 Initial Diagnosis   Breast cancer metastas[place] to liver, unspecified laterality ([place]    CHIEF COMPLIANT: Follow-up of metastatic breast cancer on Xeloda  INTERVAL HISTORY: Brittany LEISINGs a [age].o. with above-mentioned history of metastatic breast cancerwith metastases to thebone,liver,and lungswhois currently on treatment withXeloda and Xgeva. Brittany Porter presents to the clinic today for a toxicity check.  Brittany Porter is getting physical therapy for balance issues and is using a wheelchair for ambulation.  Brittany Porter uses a walker at home.  Brittany Porter thinks that Brittany Porter is slowly getting better and her energy levels are improving.  Brittany Porter does not have any problems with the Xeloda with diarrhea.  REVIEW OF SYSTEMS:  Constitutional: Continues to have generalized weakness Eyes: Denies blurriness of vision Ears, nose, mouth, throat, and face: Denies mucositis or sore throat Respiratory:  Denies cough, dyspnea or wheezes Cardiovascular: Denies palpitation, chest discomfort Gastrointestinal: Denies nausea, heartburn or change in bowel habits Skin: Denies abnormal skin rashes Lymphatics: Denies new lymphadenopathy or easy bruising Neurological: Denies numbness, tingling or new weaknesses Behavioral/Psych: Mood is stable, no new changes  Extremities: No lower extremity edema Breast: denies any pain or lumps or nodules in either breasts All other systems were reviewed with the patient and are negative.  I have reviewed the past medical history, past surgical history, social history and family history with the patient and they are unchanged from previous note.  ALLERGIES:  is allergic to prednisolone; prednisone; and sulfa antibiotics.  MEDICATIONS:  Current Outpatient Medications  Medication Sig Dispense Refill  . amiodarone (PACERONE) 200 MG tablet Take 1 tablet (200 mg total) by mouth daily. 180 tablet 0  . atorvastatin (LIPITOR) 20 MG tablet Take 1 tablet (20 mg total) by mouth daily at 6 PM. 45 tablet 1  . Calcium Carb-Cholecalciferol (CALCIUM 600+D3) 600-800 MG-UNIT TABS Take 1 tablet by mouth 2 (two) times daily.     . capecitabine (XELODA) 500 MG tablet Take 2 tablets (1,000 mg total) by mouth 2 (two) times daily after a meal. Take for 14 days on, 7 days off, repeat every 21 days 56 tablet 3  . Cholecalciferol (VITAMIN D3) 2000 UNITS TABS Take 2,000 mg by mouth daily.    . ferrous sulfate 325 (65 FE) MG tablet Take 325 mg by mouth daily as needed (energy).     . fish oil-omega-3 fatty acids 1000 MG capsule Take 1 g by mouth 2 (two) times daily.     . furosemide (LASIX) 20 MG tablet Take 1 tablet (20 mg total) by mouth daily. Reported on 05/07/2015 30 tablet 0  . gabapentin (NEURONTIN) 100 MG capsule Take 100 mg by mouth 2 (two) times daily.    . Ipratropium-Albuterol (COMBIVENT RESPIMAT) 20-100 MCG/ACT AERS respimat Inhale 1 puff into the lungs every 6 (six) hours.  (Patient taking differently: Inhale 1 puff into the lungs every 6 (six) hours as needed for wheezing or shortness of breath. ) 4 g 6  . Magnesium Oxide (MAG-OXIDE) 200 MG TABS Take 1 tablet (200 mg total) by mouth daily. (Patient taking differently: Take 200 mg by mouth at bedtime. ) 30 tablet 0  . metFORMIN (GLUCOPHAGE) 500 MG tablet Take 2 tablets (1,000 mg total) by mouth 2 (two) times daily with a meal. 120 tablet 0  . metoprolol succinate (TOPROL-XL) 25 MG 24 hr tablet Take 1 tablet (25 mg total) by mouth daily. 90 tablet 3  . OXYGEN Inhale 4 L into the lungs continuous.     . pantoprazole (PROTONIX) 40 MG tablet Take 1 tablet (40 mg total) by mouth 2 (two) times daily before a meal. 60 tablet 6  . sodium chloride (OCEAN) 0.65 % SOLN nasal spray Place 1 spray into both nostrils as needed for congestion.     No current facility-administered medications for this visit.     PHYSICAL EXAMINATION: ECOG PERFORMANCE STATUS: 1 - Symptomatic but completely ambulatory  Vitals:   02/07/19 1131  BP: 120/77  Pulse: 90  Resp: 17  Temp: 98.7 F (37.1 C)  SpO2: 93%   Filed Weights   02/07/19 1131  Weight: 182 lb 3.2 oz (82.6 kg)    GENERAL: alert, no distress and comfortable SKIN: skin  color, texture, turgor are normal, no rashes or significant lesions EYES: normal, Conjunctiva are pink and non-injected, sclera clear OROPHARYNX: no exudate, no erythema and lips, buccal mucosa, and tongue normal  NECK: supple, thyroid normal size, non-tender, without nodularity LYMPH: no palpable lymphadenopathy in the cervical, axillary or inguinal LUNGS: clear to auscultation and percussion with normal breathing effort HEART: regular rate & rhythm and no murmurs and no lower extremity edema ABDOMEN: abdomen soft, non-tender and normal bowel sounds MUSCULOSKELETAL: no cyanosis of digits and no clubbing  NEURO: alert & oriented x 3 with fluent speech, no focal motor/sensory deficits EXTREMITIES: No lower  extremity edema  LABORATORY DATA:  I have reviewed the data as listed CMP Latest Ref Rng & Units 01/10/2019 01/03/2019 01/02/2019  Glucose 70 - 99 mg/dL 206(H) 169(H) 137(H)  BUN 8 - 23 mg/dL '15 14 13  ' Creatinine 0.44 - 1.00 mg/dL 0.83 1.08(H) 0.94  Sodium 135 - 145 mmol/L 139 137 139  Potassium 3.5 - 5.1 mmol/L 3.9 3.6 3.5  Chloride 98 - 111 mmol/L 102 98 100  CO2 22 - 32 mmol/L '28 31 31  ' Calcium 8.9 - 10.3 mg/dL 7.9(L) 7.1(L) 6.6(L)  Total Protein 6.5 - 8.1 g/dL 5.3(L) - -  Total Bilirubin 0.3 - 1.2 mg/dL 0.4 - -  Alkaline Phos 38 - 126 U/L 62 - -  AST 15 - 41 U/L 18 - -  ALT 0 - 44 U/L 8 - -    Lab Results  Component Value Date   WBC 7.8 02/07/2019   HGB 9.3 (L) 02/07/2019   HCT 29.6 (L) 02/07/2019   MCV 96.4 02/07/2019   PLT 199 02/07/2019   NEUTROABS 5.8 02/07/2019    ASSESSMENT & PLAN:  Carcinoma of breast metastatic to bone [facility]) Metastatic breast cancer: recently progressive in liver and bone, also pleura.  Faslodex + Ibrance beginning 03/31/2016-March 2020 stopped due to progression Exemestane with everolimus discontinued 08/29/2018 due to hyperglycemia  [facility] molecular testing: Androgen receptor positive, PDL 1 neg, MSI stable, NTRK1/2/3Neg, T MBB intermediate 9 mutations/Mb, ESR 1-, PI 3 CA negative, BRCA negative  Current treatment:Xeloda with Xgeva Xeloda toxicities: 1.  Hospitalization for severe anemia hemoglobin of 2.6 received 4 units of PRBC: Today's blood work showed improved hemoglobin of 9.3 We reduced the dosage of Xeloda to 1000 mg p.o. twice daily 2 weeks on 1 week off and see if Brittany Porter tolerates this any better. 3.  Severe generalized weakness related to recent hospitalization for anemia.  Continuing her physical therapy.  Return to clinic in 1 month with labs and follow-up. Our plan is to obtain scans in November 2020.   No orders of the defined types were placed in this encounter.  The patient has a good understanding of the overall plan. Brittany Porter  agrees with it. Brittany Porter will call with any problems that may develop before the next visit here.  Nicholas Lose, MD 02/07/2019  Julious Oka Dorshimer am acting as scribe for Dr. Nicholas Lose.  I have reviewed the above documentation for accuracy and completeness, and I agree with the above.

## 2019-02-07 ENCOUNTER — Inpatient Hospital Stay (HOSPITAL_BASED_OUTPATIENT_CLINIC_OR_DEPARTMENT_OTHER): Payer: Medicare Other | Admitting: Hematology and Oncology

## 2019-02-07 ENCOUNTER — Inpatient Hospital Stay: Payer: Medicare Other | Attending: Hematology and Oncology

## 2019-02-07 ENCOUNTER — Other Ambulatory Visit: Payer: Medicare Other

## 2019-02-07 ENCOUNTER — Ambulatory Visit: Payer: Medicare Other | Admitting: Hematology and Oncology

## 2019-02-07 ENCOUNTER — Other Ambulatory Visit: Payer: Self-pay

## 2019-02-07 DIAGNOSIS — Z7984 Long term (current) use of oral hypoglycemic drugs: Secondary | ICD-10-CM | POA: Insufficient documentation

## 2019-02-07 DIAGNOSIS — C7951 Secondary malignant neoplasm of bone: Secondary | ICD-10-CM

## 2019-02-07 DIAGNOSIS — Z9013 Acquired absence of bilateral breasts and nipples: Secondary | ICD-10-CM | POA: Diagnosis not present

## 2019-02-07 DIAGNOSIS — C787 Secondary malignant neoplasm of liver and intrahepatic bile duct: Secondary | ICD-10-CM | POA: Insufficient documentation

## 2019-02-07 DIAGNOSIS — C782 Secondary malignant neoplasm of pleura: Secondary | ICD-10-CM | POA: Insufficient documentation

## 2019-02-07 DIAGNOSIS — D649 Anemia, unspecified: Secondary | ICD-10-CM | POA: Diagnosis not present

## 2019-02-07 DIAGNOSIS — C50919 Malignant neoplasm of unspecified site of unspecified female breast: Secondary | ICD-10-CM | POA: Diagnosis present

## 2019-02-07 DIAGNOSIS — Z79899 Other long term (current) drug therapy: Secondary | ICD-10-CM | POA: Diagnosis not present

## 2019-02-07 DIAGNOSIS — Z9221 Personal history of antineoplastic chemotherapy: Secondary | ICD-10-CM | POA: Insufficient documentation

## 2019-02-07 DIAGNOSIS — Z79811 Long term (current) use of aromatase inhibitors: Secondary | ICD-10-CM | POA: Diagnosis not present

## 2019-02-07 LAB — CBC WITH DIFFERENTIAL (CANCER CENTER ONLY)
Abs Immature Granulocytes: 0.03 10*3/uL (ref 0.00–0.07)
Basophils Absolute: 0 10*3/uL (ref 0.0–0.1)
Basophils Relative: 0 %
Eosinophils Absolute: 0.1 10*3/uL (ref 0.0–0.5)
Eosinophils Relative: 1 %
HCT: 29.6 % — ABNORMAL LOW (ref 36.0–46.0)
Hemoglobin: 9.3 g/dL — ABNORMAL LOW (ref 12.0–15.0)
Immature Granulocytes: 0 %
Lymphocytes Relative: 16 %
Lymphs Abs: 1.2 10*3/uL (ref 0.7–4.0)
MCH: 30.3 pg (ref 26.0–34.0)
MCHC: 31.4 g/dL (ref 30.0–36.0)
MCV: 96.4 fL (ref 80.0–100.0)
Monocytes Absolute: 0.6 10*3/uL (ref 0.1–1.0)
Monocytes Relative: 8 %
Neutro Abs: 5.8 10*3/uL (ref 1.7–7.7)
Neutrophils Relative %: 75 %
Platelet Count: 199 10*3/uL (ref 150–400)
RBC: 3.07 MIL/uL — ABNORMAL LOW (ref 3.87–5.11)
RDW: 21.1 % — ABNORMAL HIGH (ref 11.5–15.5)
WBC Count: 7.8 10*3/uL (ref 4.0–10.5)
nRBC: 0 % (ref 0.0–0.2)

## 2019-02-07 LAB — CMP (CANCER CENTER ONLY)
ALT: 8 U/L (ref 0–44)
AST: 20 U/L (ref 15–41)
Albumin: 2.7 g/dL — ABNORMAL LOW (ref 3.5–5.0)
Alkaline Phosphatase: 78 U/L (ref 38–126)
Anion gap: 9 (ref 5–15)
BUN: 17 mg/dL (ref 8–23)
CO2: 31 mmol/L (ref 22–32)
Calcium: 7.8 mg/dL — ABNORMAL LOW (ref 8.9–10.3)
Chloride: 100 mmol/L (ref 98–111)
Creatinine: 1.01 mg/dL — ABNORMAL HIGH (ref 0.44–1.00)
GFR, Est AFR Am: >60 mL/min (ref >60)
GFR, Estimated: 54 mL/min — ABNORMAL LOW (ref >60)
Glucose, Bld: 170 mg/dL — ABNORMAL HIGH (ref 70–99)
Potassium: 4.6 mmol/L (ref 3.5–5.1)
Sodium: 140 mmol/L (ref 135–145)
Total Bilirubin: 0.6 mg/dL (ref 0.3–1.2)
Total Protein: 6.1 g/dL — ABNORMAL LOW (ref 6.5–8.1)

## 2019-02-07 NOTE — Assessment & Plan Note (Signed)
Metastatic breast cancer: recently progressive in liver and bone, also pleura.  Faslodex + Ibrance beginning 03/31/2016-March 2020 stopped due to progression Exemestane with everolimus discontinued 08/29/2018 due to hyperglycemia  [facility] molecular testing: Androgen receptor positive, PDL 1 neg, MSI stable, NTRK1/2/3Neg, T MBB intermediate 9 mutations/Mb, ESR 1-, PI 3 CA negative, BRCA negative  Current treatment:Xeloda with Xgeva Xeloda toxicities: 1.  Hospitalization for severe anemia hemoglobin of 2.6 received 4 units of PRBC: Today's blood work showed improved hemoglobin of   We reduced the dosage of Xeloda to 1000 mg p.o. twice daily 2 weeks on 1 week off and see if she tolerates this any better. 3.  Severe generalized weakness related to recent hospitalization for anemia.  Continuing her physical therapy.  Return to clinic in 1 month with labs and follow-up.

## 2019-02-08 ENCOUNTER — Telehealth: Payer: Self-pay | Admitting: Hematology and Oncology

## 2019-02-08 NOTE — Telephone Encounter (Signed)
I left a message regarding schedule for 10/22

## 2019-02-13 ENCOUNTER — Telehealth: Payer: Self-pay | Admitting: Hematology and Oncology

## 2019-02-13 NOTE — Telephone Encounter (Signed)
R/s appt per 9/29 sch message- pt aware of appt date and time

## 2019-02-21 MED FILL — CAPECITABINE 500 MG TABS: 500 | 21 days supply | Qty: 56 | Fill #2

## 2019-03-06 ENCOUNTER — Other Ambulatory Visit: Payer: Self-pay | Admitting: *Deleted

## 2019-03-06 DIAGNOSIS — C7951 Secondary malignant neoplasm of bone: Secondary | ICD-10-CM

## 2019-03-06 DIAGNOSIS — C50919 Malignant neoplasm of unspecified site of unspecified female breast: Secondary | ICD-10-CM

## 2019-03-06 NOTE — Progress Notes (Signed)
Patient Care Team: [name], MD as PCP - General (Family Medicine)  DIAGNOSIS:    ICD-10-CM   1. 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 BRCA reportedly normal.   08/25/2015 Relapse/Recurrence   new solitary liver lesion, biopsy showing metastatic ER PR + breast   09/25/2015 - 03/11/2016 Chemotherapy   Xeloda. Stopped due to progression of liver and bone metastases; pleural metastases   03/31/2016 - 07/19/2018 Anti-estrogen oral therapy   Foslodex started 03/31/16, currently monthly  Ibrance 143m 3 weeks on, 1 week off started 03/31/16 Xgeva started on 05/05/16 Letrozole daily added 08/01/17    03/20/2018 Genetic Testing   Genetic testing performed through [facility]'s Multi-Cancer reported out on 03/19/2018 showed no pathogenic mutations. The Multi-Cancer Panel offered by [facility] includes sequencing and/or deletion duplication testing of the following 91 genes: AIP, ALK, APC, ATM, AXIN2, BAP1, BARD1, BLM, BMPR1A, BRCA1, BRCA2, BRIP1, BUB1B, CASR, CDC73, CDH1, CDK4, CDKN1B, CDKN1C, CDKN2A, CEBPA, CEP57, CHEK2, CTNNA1, DICER1, DIS3L2, EGFR, ENG, EPCAM, FH, FLCN, GALNT12, GATA2, GPC3, GREM1, HOXB13, HRAS, KIT, MAX, MEN1, MET, MITF, MLH1, MLH3, MSH2, MSH3, MSH6, MUTYH, NBN, NF1, NF2, NTHL1, PALB2, PDGFRA, PHOX2B, PMS2, POLD1, POLE, POT1, PRKAR1A, PTCH1, PTEN, RAD50, RAD51C, RAD51D, RB1, RECQL4,  RET, RNF43, RPS20, RUNX1, SDHA, SDHAF2, SDHB, SDHC, SDHD, SMAD4, SMARCA4, SMARCB1, SMARCE1, STK11, SUFU, TERC, TERT, TMEM127, TP53, TSC1, TSC2, VHL, WRN, WT1  A variant of uncertain significance (VUS) in a gene called MET was also noted. c.1669A>G (p.Thr557Ala)   04/08/2018 Miscellaneous   [facility] molecular testing: Androgen receptor positive, PDL 1 neg, MSI stable, NTRK1/2/3Neg, T MBB intermediate 9 mutations/Mb, ESR 1-, PI 3 CA negative, BRCA negative   07/20/2018 - 08/30/2018 Anti-estrogen oral therapy   Exemestane with everolimus.  Discontinued due to severe hyperglycemia   09/24/2018 -  Chemotherapy   [place] day 1 day 8 every 3 weeks    [place]  03/27/2016 Initial Diagnosis   [place] metastatic to bone ([place]   08/03/2017 - 01/31/2018 Chemotherapy   Foslodex started 03/31/16 monthly  Ibrance 1234m3 weeks on, 1 week off started 03/31/16 Daily letrozole added 08/01/17    Breast cancer metastasized to liver, unspecified laterality ([facility]  03/27/2016 Initial Diagnosis   Breast cancer metastasized to liver, unspecified laterality ([facility]    CHIEF COMPLIANT: Follow-up of metastatic breast cancer onXeloda  INTERVAL HISTORY: Brittany BRUTUSs a [age].o. with above-mentioned history of metastatic breast cancerwith metastases to thebone,liver,and lungswhois currently on treatment withXeloda andXgeva. She presents to the clinic today for a toxicity check. She is tolerating well Xeloda 1000 mg p.o. twice daily much better.  She has very occasional nausea.  Denies any diarrhea or hand-foot syndrome.  Her blood work today shows stable hemoglobin levels.  No evidence of neutropenia.  LFTs also stable.  She is in our clinic today using a walker.  She tells me she no  longer needs a wheelchair.  Her strength is improving.  REVIEW OF SYSTEMS:   Constitutional: Came in with a walker not a wheelchair. Eyes: Denies blurriness of vision  Ears, nose, mouth, throat, and face: Denies mucositis or sore throat Respiratory: Denies cough, dyspnea or wheezes Cardiovascular: Denies palpitation, chest discomfort Gastrointestinal: Denies nausea, heartburn or change in bowel habits Skin: Denies abnormal skin rashes Lymphatics: Denies new lymphadenopathy or easy bruising Neurological: Denies numbness, tingling or new weaknesses Behavioral/Psych: Mood is stable, no new changes  Extremities: No lower extremity edema Breast: denies any pain or lumps or nodules in either breasts All other systems were reviewed with the patient and are negative.  I have reviewed the past medical history, past surgical history, social history and family history with the patient and they are unchanged from previous note.  ALLERGIES:  is allergic to prednisolone; prednisone; and sulfa antibiotics.  MEDICATIONS:  Current Outpatient Medications  Medication Sig Dispense Refill  . amiodarone (PACERONE) 200 MG tablet Take 1 tablet (200 mg total) by mouth daily. 180 tablet 0  . atorvastatin (LIPITOR) 20 MG tablet Take 1 tablet (20 mg total) by mouth daily at 6 PM. 45 tablet 1  . Calcium Carb-Cholecalciferol (CALCIUM 600+D3) 600-800 MG-UNIT TABS Take 1 tablet by mouth 2 (two) times daily.     . capecitabine (XELODA) 500 MG tablet Take 2 tablets (1,000 mg total) by mouth 2 (two) times daily after a meal. Take for 14 days on, 7 days off, repeat every 21 days 56 tablet 3  . Cholecalciferol (VITAMIN D3) 2000 UNITS TABS Take 2,000 mg by mouth daily.    . ferrous sulfate 325 (65 FE) MG tablet Take 325 mg by mouth daily as needed (energy).     . fish oil-omega-3 fatty acids 1000 MG capsule Take 1 g by mouth 2 (two) times daily.     . furosemide (LASIX) 20 MG tablet Take 1 tablet (20 mg total) by mouth daily. Reported on 05/07/2015 30 tablet 0  . gabapentin (NEURONTIN) 100 MG capsule Take 100 mg by mouth 2 (two) times daily.    . Ipratropium-Albuterol (COMBIVENT RESPIMAT)  20-100 MCG/ACT AERS respimat Inhale 1 puff into the lungs every 6 (six) hours. (Patient taking differently: Inhale 1 puff into the lungs every 6 (six) hours as needed for wheezing or shortness of breath. ) 4 g 6  . Magnesium Oxide (MAG-OXIDE) 200 MG TABS Take 1 tablet (200 mg total) by mouth daily. (Patient taking differently: Take 200 mg by mouth at bedtime. ) 30 tablet 0  . metFORMIN (GLUCOPHAGE) 500 MG tablet Take 2 tablets (1,000 mg total) by mouth 2 (two) times daily with a meal. 120 tablet 0  . metoprolol succinate (TOPROL-XL) 25 MG 24 hr tablet Take 1 tablet (25 mg total) by mouth daily. 90 tablet 3  . OXYGEN Inhale 4 L into the lungs continuous.     . pantoprazole (PROTONIX) 40 MG tablet Take 1 tablet (40 mg total) by mouth 2 (two) times daily before a meal. 60 tablet 6  . sodium chloride (OCEAN) 0.65 % SOLN nasal spray Place 1 spray into both nostrils as needed for congestion.     No current facility-administered medications for this visit.     PHYSICAL EXAMINATION: ECOG PERFORMANCE STATUS: 1 - Symptomatic but completely ambulatory  Vitals:   03/07/19 1505  BP: (!) 140/42  Pulse: 77  Resp: 17  Temp: 97.8 F (36.6 C)  SpO2: 94%   Filed Weights   03/07/19 1505  Weight: 176 lb 11.2 oz (80.2 kg)    GENERAL: alert, no distress and comfortable SKIN: skin color, texture, turgor are normal, no rashes or significant lesions EYES: normal, Conjunctiva are pink and non-injected, sclera clear OROPHARYNX: no exudate, no erythema and lips, buccal mucosa, and tongue normal  NECK: supple, thyroid normal size, non-tender, without nodularity LYMPH: no palpable lymphadenopathy in the cervical, axillary or inguinal LUNGS: clear to auscultation and percussion with normal breathing effort HEART: regular rate & rhythm and no murmurs and no lower extremity edema ABDOMEN: abdomen soft, non-tender and normal bowel sounds MUSCULOSKELETAL: no cyanosis of digits and no clubbing  NEURO: alert &  oriented x 3 with fluent speech, no focal motor/sensory deficits EXTREMITIES: No lower extremity edema  LABORATORY DATA:  I have reviewed the data as listed CMP Latest Ref Rng & Units 03/07/2019 02/07/2019 01/10/2019  Glucose 70 - 99 mg/dL 134(H) 170(H) 206(H)  BUN 8 - 23 mg/dL 24(H) 17 15  Creatinine 0.44 - 1.00 mg/dL 1.16(H) 1.01(H) 0.83  Sodium 135 - 145 mmol/L 141 140 139  Potassium 3.5 - 5.1 mmol/L 4.7 4.6 3.9  Chloride 98 - 111 mmol/L 100 100 102  CO2 22 - 32 mmol/L _0 Calcium 8.9 - 10.3 mg/dL 9.2 7.8(L) 7.9(L)  Total Protein 6.5 - 8.1 g/dL 6.7 6.1(L) 5.3(L)  Total Bilirubin 0.3 - 1.2 mg/dL 0.8 0.6 0.4  Alkaline Phos 38 - 126 U/L 62 78 62  AST 15 - 41 U/L _1 ALT 0 - 44 U/L _2 Lab Results  Component Value Date   WBC 5.2 03/07/2019   HGB 9.4 (L) 03/07/2019   HCT 29.5 (L) 03/07/2019   MCV 99.3 03/07/2019   PLT 213 03/07/2019   NEUTROABS 3.2 03/07/2019    ASSESSMENT & PLAN:  Breast cancer metastasized to liver [facility]) Metastatic breast cancer: recently progressive in liver and bone, also pleura.  Faslodex + Ibrance beginning 03/31/2016-March 2020 stopped due to progression Exemestane with everolimus discontinued 08/29/2018 due to hyperglycemia  [facility] molecular testing: Androgen receptor positive, PDL 1 neg, MSI stable, NTRK1/2/3Neg, T MBB intermediate 9 mutations/Mb, ESR 1-, PI 3 CA negative, BRCA negative  Current treatment:Xeloda with Xgeva Xeloda toxicities: 1.Hospitalization for severe anemia hemoglobin of 2.6 received 4 units of PRBC: Today's blood work showed improved hemoglobin of 9.3 We reduced the dosage of Xeloda to 1000 mg p.o. twice daily 2 weeks on 1 week off and see if she tolerates this any better. 3.Severe generalized weakness related to recent hospitalization for anemia. Continuing her physical therapy.  Return to clinic in 1 month with labs and follow-up. Our plan is to obtain scans.  These will need to be scheduled.  We will  plan to do the scans in 1 month and follow-up after that.    No orders of the defined types were placed in this encounter.  The patient has a good understanding of the overall plan. she agrees with it. she will call with any problems that may develop before the next visit here.  Nicholas Lose, MD 03/07/2019  Brittany Porter am acting as scribe for Dr. Nicholas Lose.  I have reviewed the above documentation for accuracy and completeness, and I agree with the above.

## 2019-03-07 ENCOUNTER — Ambulatory Visit: Payer: Medicare Other | Admitting: Hematology and Oncology

## 2019-03-07 ENCOUNTER — Inpatient Hospital Stay: Payer: Medicare Other | Admitting: Hematology and Oncology

## 2019-03-07 ENCOUNTER — Other Ambulatory Visit: Payer: Medicare Other

## 2019-03-07 ENCOUNTER — Inpatient Hospital Stay: Payer: Medicare Other | Attending: Hematology and Oncology

## 2019-03-07 ENCOUNTER — Other Ambulatory Visit: Payer: Self-pay

## 2019-03-07 VITALS — BP 140/42 | HR 77 | Temp 97.8°F | Resp 17 | Ht 63.5 in | Wt 176.7 lb

## 2019-03-07 DIAGNOSIS — Z23 Encounter for immunization: Secondary | ICD-10-CM | POA: Diagnosis not present

## 2019-03-07 DIAGNOSIS — Z79899 Other long term (current) drug therapy: Secondary | ICD-10-CM | POA: Diagnosis not present

## 2019-03-07 DIAGNOSIS — C50911 Malignant neoplasm of unspecified site of right female breast: Secondary | ICD-10-CM | POA: Diagnosis not present

## 2019-03-07 DIAGNOSIS — C50919 Malignant neoplasm of unspecified site of unspecified female breast: Secondary | ICD-10-CM

## 2019-03-07 DIAGNOSIS — C787 Secondary malignant neoplasm of liver and intrahepatic bile duct: Secondary | ICD-10-CM

## 2019-03-07 DIAGNOSIS — Z7984 Long term (current) use of oral hypoglycemic drugs: Secondary | ICD-10-CM | POA: Insufficient documentation

## 2019-03-07 DIAGNOSIS — Z0000 Encounter for general adult medical examination without abnormal findings: Secondary | ICD-10-CM

## 2019-03-07 DIAGNOSIS — Z9221 Personal history of antineoplastic chemotherapy: Secondary | ICD-10-CM | POA: Insufficient documentation

## 2019-03-07 DIAGNOSIS — C782 Secondary malignant neoplasm of pleura: Secondary | ICD-10-CM | POA: Insufficient documentation

## 2019-03-07 DIAGNOSIS — Z9013 Acquired absence of bilateral breasts and nipples: Secondary | ICD-10-CM | POA: Diagnosis not present

## 2019-03-07 DIAGNOSIS — D649 Anemia, unspecified: Secondary | ICD-10-CM | POA: Diagnosis not present

## 2019-03-07 DIAGNOSIS — C7951 Secondary malignant neoplasm of bone: Secondary | ICD-10-CM | POA: Insufficient documentation

## 2019-03-07 LAB — CBC WITH DIFFERENTIAL (CANCER CENTER ONLY)
Abs Immature Granulocytes: 0.01 10*3/uL (ref 0.00–0.07)
Basophils Absolute: 0 10*3/uL (ref 0.0–0.1)
Basophils Relative: 0 %
Eosinophils Absolute: 0.1 10*3/uL (ref 0.0–0.5)
Eosinophils Relative: 2 %
HCT: 29.5 % — ABNORMAL LOW (ref 36.0–46.0)
Hemoglobin: 9.4 g/dL — ABNORMAL LOW (ref 12.0–15.0)
Immature Granulocytes: 0 %
Lymphocytes Relative: 27 %
Lymphs Abs: 1.4 10*3/uL (ref 0.7–4.0)
MCH: 31.6 pg (ref 26.0–34.0)
MCHC: 31.9 g/dL (ref 30.0–36.0)
MCV: 99.3 fL (ref 80.0–100.0)
Monocytes Absolute: 0.4 10*3/uL (ref 0.1–1.0)
Monocytes Relative: 9 %
Neutro Abs: 3.2 10*3/uL (ref 1.7–7.7)
Neutrophils Relative %: 62 %
Platelet Count: 213 10*3/uL (ref 150–400)
RBC: 2.97 MIL/uL — ABNORMAL LOW (ref 3.87–5.11)
RDW: 22.5 % — ABNORMAL HIGH (ref 11.5–15.5)
WBC Count: 5.2 10*3/uL (ref 4.0–10.5)
nRBC: 0 % (ref 0.0–0.2)

## 2019-03-07 LAB — CMP (CANCER CENTER ONLY)
ALT: 8 U/L (ref 0–44)
AST: 23 U/L (ref 15–41)
Albumin: 3.3 g/dL — ABNORMAL LOW (ref 3.5–5.0)
Alkaline Phosphatase: 62 U/L (ref 38–126)
Anion gap: 12 (ref 5–15)
BUN: 24 mg/dL — ABNORMAL HIGH (ref 8–23)
CO2: 29 mmol/L (ref 22–32)
Calcium: 9.2 mg/dL (ref 8.9–10.3)
Chloride: 100 mmol/L (ref 98–111)
Creatinine: 1.16 mg/dL — ABNORMAL HIGH (ref 0.44–1.00)
GFR, Est AFR Am: 53 mL/min — ABNORMAL LOW (ref >60)
GFR, Estimated: 46 mL/min — ABNORMAL LOW (ref >60)
Glucose, Bld: 134 mg/dL — ABNORMAL HIGH (ref 70–99)
Potassium: 4.7 mmol/L (ref 3.5–5.1)
Sodium: 141 mmol/L (ref 135–145)
Total Bilirubin: 0.8 mg/dL (ref 0.3–1.2)
Total Protein: 6.7 g/dL (ref 6.5–8.1)

## 2019-03-07 MED ORDER — INFLUENZA VAC A&B SA ADJ QUAD 0.5 ML IM PRSY
PREFILLED_SYRINGE | INTRAMUSCULAR | Status: AC
  Filled 2019-03-07: qty 0.5

## 2019-03-07 MED ORDER — INFLUENZA VAC A&B SA ADJ QUAD 0.5 ML IM PRSY
0.5000 mL | PREFILLED_SYRINGE | Freq: Once | INTRAMUSCULAR | Status: AC
  Administered 2019-03-07: 0.5 mL via INTRAMUSCULAR

## 2019-03-07 NOTE — Assessment & Plan Note (Signed)
Metastatic breast cancer: recently progressive in liver and bone, also pleura.  Faslodex + Ibrance beginning 03/31/2016-March 2020 stopped due to progression Exemestane with everolimus discontinued 08/29/2018 due to hyperglycemia  [facility] molecular testing: Androgen receptor positive, PDL 1 neg, MSI stable, NTRK1/2/3Neg, T MBB intermediate 9 mutations/Mb, ESR 1-, PI 3 CA negative, BRCA negative  Current treatment:Xeloda with Xgeva Xeloda toxicities: 1.Hospitalization for severe anemia hemoglobin of 2.6 received 4 units of PRBC: Today's blood work showed improved hemoglobin of 9.3 We reduced the dosage of Xeloda to 1000 mg p.o. twice daily 2 weeks on 1 week off and see if she tolerates this any better. 3.Severe generalized weakness related to recent hospitalization for anemia. Continuing her physical therapy.  Return to clinic in 1 month with labs and follow-up. Our plan is to obtain scans.  These will need to be scheduled.  We will plan to do the scans in 1 month and follow-up after that.

## 2019-03-08 ENCOUNTER — Telehealth: Payer: Self-pay | Admitting: Hematology and Oncology

## 2019-03-08 NOTE — Telephone Encounter (Signed)
I talk with patient regarding schedule  

## 2019-03-14 MED FILL — CAPECITABINE 500 MG TABS: 500 | 21 days supply | Qty: 56 | Fill #3

## 2019-04-02 ENCOUNTER — Other Ambulatory Visit: Payer: Self-pay | Admitting: Hematology and Oncology

## 2019-04-02 DIAGNOSIS — C50919 Malignant neoplasm of unspecified site of unspecified female breast: Secondary | ICD-10-CM

## 2019-04-04 ENCOUNTER — Ambulatory Visit (HOSPITAL_COMMUNITY)
Admission: RE | Admit: 2019-04-04 | Discharge: 2019-04-04 | Disposition: A | Discharge status: Home / Self Care | Payer: Medicare Other | Source: Ambulatory Visit | Attending: Hematology and Oncology | Admitting: Hematology and Oncology

## 2019-04-04 ENCOUNTER — Other Ambulatory Visit: Payer: Self-pay | Admitting: Hematology and Oncology

## 2019-04-04 ENCOUNTER — Other Ambulatory Visit: Payer: Self-pay

## 2019-04-04 ENCOUNTER — Other Ambulatory Visit: Payer: Medicare Other

## 2019-04-04 ENCOUNTER — Inpatient Hospital Stay: Payer: Medicare Other | Attending: Hematology and Oncology

## 2019-04-04 DIAGNOSIS — C782 Secondary malignant neoplasm of pleura: Secondary | ICD-10-CM | POA: Diagnosis not present

## 2019-04-04 DIAGNOSIS — Z9013 Acquired absence of bilateral breasts and nipples: Secondary | ICD-10-CM | POA: Diagnosis not present

## 2019-04-04 DIAGNOSIS — Z79899 Other long term (current) drug therapy: Secondary | ICD-10-CM | POA: Insufficient documentation

## 2019-04-04 DIAGNOSIS — C787 Secondary malignant neoplasm of liver and intrahepatic bile duct: Secondary | ICD-10-CM | POA: Insufficient documentation

## 2019-04-04 DIAGNOSIS — Z9221 Personal history of antineoplastic chemotherapy: Secondary | ICD-10-CM | POA: Insufficient documentation

## 2019-04-04 DIAGNOSIS — C50919 Malignant neoplasm of unspecified site of unspecified female breast: Secondary | ICD-10-CM | POA: Insufficient documentation

## 2019-04-04 DIAGNOSIS — Z170 Estrogen receptor positive status [ER+]: Secondary | ICD-10-CM | POA: Insufficient documentation

## 2019-04-04 DIAGNOSIS — C50911 Malignant neoplasm of unspecified site of right female breast: Secondary | ICD-10-CM | POA: Diagnosis present

## 2019-04-04 DIAGNOSIS — C7951 Secondary malignant neoplasm of bone: Secondary | ICD-10-CM | POA: Diagnosis not present

## 2019-04-04 LAB — CBC WITH DIFFERENTIAL (CANCER CENTER ONLY)
Abs Immature Granulocytes: 0.03 10*3/uL (ref 0.00–0.07)
Basophils Absolute: 0 10*3/uL (ref 0.0–0.1)
Basophils Relative: 0 %
Eosinophils Absolute: 0.1 10*3/uL (ref 0.0–0.5)
Eosinophils Relative: 1 %
HCT: 30 % — ABNORMAL LOW (ref 36.0–46.0)
Hemoglobin: 9.7 g/dL — ABNORMAL LOW (ref 12.0–15.0)
Immature Granulocytes: 1 %
Lymphocytes Relative: 29 %
Lymphs Abs: 1.5 10*3/uL (ref 0.7–4.0)
MCH: 33.1 pg (ref 26.0–34.0)
MCHC: 32.3 g/dL (ref 30.0–36.0)
MCV: 102.4 fL — ABNORMAL HIGH (ref 80.0–100.0)
Monocytes Absolute: 0.4 10*3/uL (ref 0.1–1.0)
Monocytes Relative: 8 %
Neutro Abs: 3.2 10*3/uL (ref 1.7–7.7)
Neutrophils Relative %: 61 %
Platelet Count: 171 10*3/uL (ref 150–400)
RBC: 2.93 MIL/uL — ABNORMAL LOW (ref 3.87–5.11)
RDW: 23 % — ABNORMAL HIGH (ref 11.5–15.5)
WBC Count: 5.2 10*3/uL (ref 4.0–10.5)
nRBC: 0 % (ref 0.0–0.2)

## 2019-04-04 LAB — CMP (CANCER CENTER ONLY)
ALT: 9 U/L (ref 0–44)
AST: 22 U/L (ref 15–41)
Albumin: 3.5 g/dL (ref 3.5–5.0)
Alkaline Phosphatase: 55 U/L (ref 38–126)
Anion gap: 13 (ref 5–15)
BUN: 25 mg/dL — ABNORMAL HIGH (ref 8–23)
CO2: 28 mmol/L (ref 22–32)
Calcium: 7.9 mg/dL — ABNORMAL LOW (ref 8.9–10.3)
Chloride: 101 mmol/L (ref 98–111)
Creatinine: 1.35 mg/dL — ABNORMAL HIGH (ref 0.44–1.00)
GFR, Est AFR Am: 44 mL/min — ABNORMAL LOW (ref >60)
GFR, Estimated: 38 mL/min — ABNORMAL LOW (ref >60)
Glucose, Bld: 162 mg/dL — ABNORMAL HIGH (ref 70–99)
Potassium: 4.8 mmol/L (ref 3.5–5.1)
Sodium: 142 mmol/L (ref 135–145)
Total Bilirubin: 0.9 mg/dL (ref 0.3–1.2)
Total Protein: 6.9 g/dL (ref 6.5–8.1)

## 2019-04-04 MED FILL — CAPECITABINE 500 MG TABS: 500 | 21 days supply | Qty: 56 | Fill #0

## 2019-04-05 ENCOUNTER — Telehealth: Payer: Self-pay | Admitting: *Deleted

## 2019-04-05 NOTE — Telephone Encounter (Signed)
Received call from pt stating she had direct contact with someone that has tested positive for Covid-19.  Pt states at this time she is asymptomatic but would like to be tested.  RN educated pt on testing at the [facility] and precautions to take with quarantine and hand washing.  Pt MD apt on Monday scheduled to be a virtual visit and injection apt pushed out by one week while pt waits for Covid results.  Pt verbalized understanding and appreciative of the advice.

## 2019-04-07 NOTE — Progress Notes (Signed)
HEMATOLOGY-ONCOLOGY DOXIMITY VISIT PROGRESS NOTE  I connected with Brittany Porter on 04/08/2019 at 11:00 AM EST by Doximity video conference and verified that I am speaking with the correct person using two identifiers.  I discussed the limitations, risks, security and privacy concerns of performing an evaluation and management service by Doximity and the availability of in person appointments.  I also discussed with the patient that there may be a patient responsible charge related to this service. The patient expressed understanding and agreed to proceed.  Patient's Location: Home Physician Location: Clinic  CHIEF COMPLIANT: Follow-up of metastatic breast cancer onXeloda to review scans  INTERVAL HISTORY: Brittany Porter is a 73 y.o. female with above-mentioned history of metastatic breast cancerwith metastases to thebone,liver,and lungswhois currently on treatment withXelodaandXgeva.CT CAP on 04/04/19 showed worsening hepatic metastases and stable osseous metastases. She presents over Doximity todayto review her scans.  Oncology History  Breast cancer metastasized to liver [facility])  1997 Initial Diagnosis   bilateral breast cancers 1997 treated with bilateral mastectomies and axillary node dissections, adjuvant chemotherapy and radiation.   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 BRCA reportedly normal.   08/25/2015 Relapse/Recurrence   new solitary liver lesion, biopsy showing metastatic ER PR + breast   09/25/2015 - 03/11/2016 Chemotherapy   Xeloda. Stopped due to progression of liver and bone metastases; pleural metastases   03/31/2016 - 07/19/2018 Anti-estrogen oral therapy   Foslodex started 03/31/16,  currently monthly  Ibrance '125mg'$  3 weeks on, 1 week off started 03/31/16 Xgeva started on 05/05/16 Letrozole daily added 08/01/17    03/20/2018 Genetic Testing   Genetic testing performed through [facility]'s Multi-Cancer reported out on 03/19/2018 showed no pathogenic mutations. The Multi-Cancer Panel offered by [facility] includes sequencing and/or deletion duplication testing of the following 91 genes: AIP, ALK, APC, ATM, AXIN2, BAP1, BARD1, BLM, BMPR1A, BRCA1, BRCA2, BRIP1, BUB1B, CASR, CDC73, CDH1, CDK4, CDKN1B, CDKN1C, CDKN2A, CEBPA, CEP57, CHEK2, CTNNA1, DICER1, DIS3L2, EGFR, ENG, EPCAM, FH, FLCN, GALNT12, GATA2, GPC3, GREM1, HOXB13, HRAS, KIT, MAX, MEN1, MET, MITF, MLH1, MLH3, MSH2, MSH3, MSH6, MUTYH, NBN, NF1, NF2, NTHL1, PALB2, PDGFRA, PHOX2B, PMS2, POLD1, POLE, POT1, PRKAR1A, PTCH1, PTEN, RAD50, RAD51C, RAD51D, RB1, RECQL4, RET, RNF43, RPS20, RUNX1, SDHA, SDHAF2, SDHB, SDHC, SDHD, SMAD4, SMARCA4, SMARCB1, SMARCE1, STK11, SUFU, TERC, TERT, TMEM127, TP53, TSC1, TSC2, VHL, WRN, WT1  A variant of uncertain significance (VUS) in a gene called MET was also noted. c.1669A>G (p.Thr557Ala)   04/08/2018 Miscellaneous   [facility] molecular testing: Androgen receptor positive, PDL 1 neg, MSI stable, NTRK1/2/3Neg, T MBB intermediate 9 mutations/Mb, ESR 1-, PI 3 CA negative, BRCA negative   07/20/2018 - 08/30/2018 Anti-estrogen oral therapy   Exemestane with everolimus.  Discontinued due to severe hyperglycemia   09/24/2018 -  Chemotherapy   Halaven day 1 day 8 every 3 weeks    Carcinoma of breast metastatic to bone ([place])  03/27/2016 Initial Diagnosis   Carcinoma of breast metastatic to bone ([place])   08/03/2017 - 01/31/2018 Chemotherapy   Foslodex started 03/31/16 monthly  Ibrance '125mg'$  3 weeks on, 1 week off started 03/31/16 Daily letrozole added 08/01/17    Breast cancer metastasized to liver, unspecified laterality ([place]) (Resolved)  03/27/2016 Initial Diagnosis   Breast cancer metastasized to liver,  unspecified laterality ([place])     REVIEW OF SYSTEMS:   Constitutional: Denies fevers, chills or abnormal weight loss Eyes: Denies blurriness of vision Ears, nose, mouth, throat, and face: Denies mucositis or sore throat Respiratory: Denies cough, dyspnea or wheezes Cardiovascular: Denies palpitation, chest discomfort Gastrointestinal:  Denies nausea, heartburn or change in bowel habits Skin: Denies abnormal skin rashes Lymphatics: Denies new lymphadenopathy or easy bruising Neurological:Denies numbness, tingling or new weaknesses Behavioral/Psych: Mood is stable, no new changes  Extremities: No lower extremity edema Breast: denies any pain or lumps or nodules in either breasts All other systems were reviewed with the patient and are negative.  Observations/Objective:  There were no vitals filed for this visit. There is no height or weight on file to calculate BMI.  I have reviewed the data as listed CMP Latest Ref Rng & Units 04/04/2019 03/07/2019 02/07/2019  Glucose 70 - 99 mg/dL 162(H) 134(H) 170(H)  BUN 8 - 23 mg/dL 25(H) 24(H) 17  Creatinine 0.44 - 1.00 mg/dL 1.35(H) 1.16(H) 1.01(H)  Sodium 135 - 145 mmol/L 142 141 140  Potassium 3.5 - 5.1 mmol/L 4.8 4.7 4.6  Chloride 98 - 111 mmol/L 101 100 100  CO2 22 - 32 mmol/L '28 29 31  '$ Calcium 8.9 - 10.3 mg/dL 7.9(L) 9.2 7.8(L)  Total Protein 6.5 - 8.1 g/dL 6.9 6.7 6.1(L)  Total Bilirubin 0.3 - 1.2 mg/dL 0.9 0.8 0.6  Alkaline Phos 38 - 126 U/L 55 62 78  AST 15 - 41 U/L '22 23 20  '$ ALT 0 - 44 U/L '9 8 8    '$ Lab Results  Component Value Date   WBC 5.2 04/04/2019   HGB 9.7 (L) 04/04/2019   HCT 30.0 (L) 04/04/2019   MCV 102.4 (H) 04/04/2019   PLT 171 04/04/2019   NEUTROABS 3.2 04/04/2019      Assessment Plan:  Breast cancer metastasized to liver [facility]) Metastatic breast cancer: recently progressive in liver and bone, also pleura.  Faslodex + Ibrance beginning 03/31/2016-March 2020 stopped due to progression Exemestane with  everolimus discontinued 08/29/2018 due to hyperglycemia  [facility] molecular testing: Androgen receptor positive, PDL 1 neg, MSI stable, NTRK1/2/3Neg, T MBB intermediate 9 mutations/Mb, ESR 1-, PI 3 CA negative, BRCA negative  Current treatment:Xeloda with Xgeva (Xeloda discontinued 04/08/2019)  Hospitalization for severe anemia hemoglobin of 2.6 received 4 units of PRBC  CT CAP: 04/04/2019: Worsening liver metastatic disease with increase in number and size of the liver masses.  Left lobe: 5.6 cm previously was 2.6 cm.  Right lobe 4.1 cm previously 4 cm, 2.8 cm was 0.8 cm.  Stable bone mets  Radiology review: I discussed the CT scan results.  It clearly shows progression of disease.  Dramatic increase in size of the liver metastases.  Recommendation: Discontinue Xeloda and start enzalutamide given the androgen receptor positivity. I counseled extensively about enzalutamide and its toxicities.  Return to clinic in 3 weeks for follow-up with labs   I discussed the assessment and treatment plan with the patient. The patient was provided an opportunity to ask questions and all were answered. The patient agreed with the plan and demonstrated an understanding of the instructions. The patient was advised to call back or seek an in-person evaluation if the symptoms worsen or if the condition fails to improve as anticipated.   I provided 15 minutes of face-to-face Doximity time during this encounter.    Rulon Eisenmenger, MD 04/08/2019   Julious Oka Dorshimer, am acting as scribe for Nicholas Lose, MD.  I have reviewed the above documentation for accuracy and completeness, and I agree with the above.

## 2019-04-08 ENCOUNTER — Telehealth: Payer: Self-pay | Admitting: Pharmacist

## 2019-04-08 ENCOUNTER — Inpatient Hospital Stay (HOSPITAL_BASED_OUTPATIENT_CLINIC_OR_DEPARTMENT_OTHER): Payer: Medicare Other | Admitting: Hematology and Oncology

## 2019-04-08 ENCOUNTER — Other Ambulatory Visit: Payer: Self-pay

## 2019-04-08 ENCOUNTER — Inpatient Hospital Stay: Payer: Medicare Other

## 2019-04-08 DIAGNOSIS — Z20822 Contact with and (suspected) exposure to covid-19: Secondary | ICD-10-CM

## 2019-04-08 DIAGNOSIS — C50911 Malignant neoplasm of unspecified site of right female breast: Secondary | ICD-10-CM | POA: Diagnosis not present

## 2019-04-08 DIAGNOSIS — C787 Secondary malignant neoplasm of liver and intrahepatic bile duct: Secondary | ICD-10-CM

## 2019-04-08 MED ORDER — ENZALUTAMIDE 40 MG PO CAPS: 160.0000 mg | ORAL_CAPSULE | Freq: Every day | ORAL | 3 refills | Status: DC

## 2019-04-08 NOTE — Assessment & Plan Note (Signed)
Metastatic breast cancer: recently progressive in liver and bone, also pleura.  Faslodex + Ibrance beginning 03/31/2016-March 2020 stopped due to progression Exemestane with everolimus discontinued 08/29/2018 due to hyperglycemia  [facility] molecular testing: Androgen receptor positive, PDL 1 neg, MSI stable, NTRK1/2/3Neg, T MBB intermediate 9 mutations/Mb, ESR 1-, PI 3 CA negative, BRCA negative  Current treatment:Xeloda with Xgeva  Hospitalization for severe anemia hemoglobin of 2.6 received 4 units of PRBC  CT CAP: 04/04/2019: Worsening liver metastatic disease with increase in number and size of the liver masses.  Left lobe: 5.6 cm previously was 2.6 cm.  Right lobe 4.1 cm previously 4 cm, 2.8 cm was 0.8 cm.  Stable bone mets  Radiology review: I discussed the CT scan results.  It clearly shows progression of disease.  Recommendation: Discontinue Xeloda and start enzalutamide given the androgen receptor positivity.  Return to clinic in 1 month for follow-up

## 2019-04-08 NOTE — Telephone Encounter (Signed)
Oral Oncology Pharmacist Encounter  MD alerted to interaction between enzalutamide and amiodarone. OK to proceed.  [name], PharmD, BCPS, BCOP  04/08/2019 3:58 PM Oral Oncology Clinic [phone]

## 2019-04-08 NOTE — Telephone Encounter (Signed)
Oral Oncology Pharmacist Encounter  Insurance authorization for [place] (enzalutamide) capsules submitted to [employer] D insurance on Cover My Meds  Key: [id number] Status: pending I submitted the JCO 2018. 36(9): [phone] article showing benefit of enzalutamide in androgen receptor positive breast cancer along with this PA request  This encounter will continue to be updated until final determination.  [name], PharmD, BCPS, BCOP  04/08/2019   3:38 PM Oral Oncology Clinic [phone]

## 2019-04-08 NOTE — Telephone Encounter (Signed)
Oral Oncology Pharmacist Encounter  Received new prescription for Xtandi (enzalutamide) for the treatment of heavily pretreated, metastatic, androgen receptor positive breast cancer, planned duration until disease progression or unacceptable toxicity.  Enzalutamide is planned to be administered at 160 mg by mouth once daily.  Labs from 04/04/2019 assessed, OK for treatment initiation. SCr=1.35, est CrCl ~ 45 mL/min, no dose adjustments per manufacturer  Current medication list in Epic reviewed, DDIs with enzalutamide identified:  Category D interaction with amiodarone: enzalutamide is an inducer of CYP3A4 leading to possible increased metabolism and decreased systemic exposure to patient's amiodarone. Interaction will be discussed with MD.  Prescription has been e-scribed to the [facility] for benefits analysis and approval by MD.  Oral Oncology Clinic will continue to follow for insurance authorization, copayment issues, initial counseling and start date.  [name], PharmD, BCPS, BCOP  04/08/2019 3:00 PM Oral Oncology Clinic [phone]

## 2019-04-09 ENCOUNTER — Telehealth: Payer: Self-pay | Admitting: Hematology and Oncology

## 2019-04-09 LAB — NOVEL CORONAVIRUS, NAA: SARS-CoV-2, NAA: NOT DETECTED

## 2019-04-09 NOTE — Telephone Encounter (Signed)
I talk with patient regarding schedule  

## 2019-04-10 NOTE — Telephone Encounter (Signed)
Oral Oncology Pharmacist Encounter  Insurance authorization for [place] (enzalutamide) capsules submitted to [employer] D insurance on Cover My Meds  Key: [id number] Status: approved Ref#: [id number] Effective dates: 04/09/2019 - 05/15/2020   [name], PharmD, BCPS, BCOP  04/10/2019   9:14 AM Oral Oncology Clinic [phone]

## 2019-04-12 ENCOUNTER — Inpatient Hospital Stay: Payer: Medicare Other

## 2019-04-15 NOTE — Telephone Encounter (Signed)
Oral Chemotherapy Pharmacist Encounter   I spoke with patient for overview of: Xtandi (enzalutamide) for the treatment of heavily pretreated, metastatic, androgen receptor positive breast cancer, planned duration until disease progression or unacceptable toxicity..   Enzalutamide use is based on the publication in Journal of Clinical Oncology (JCO 2018. 36(9): [id number]) which showed positive disease responses in androgen receptor positive disease.  Counseled patient on administration, dosing, side effects, monitoring, drug-food interactions, safe handling, storage, and disposal.  Patient will take Xtandi 40mg  capsules, 4 capsules (160mg ) by mouth once daily without regard to food.  Xtandi start date: 04/18/2019  Adverse effects include but are not limited to: fatigue, nausea, decreased appetite, constipation, diarrhea, headache, back pain, arthralgias, and hot flashes.   Reviewed with patient importance of keeping a medication schedule and plan for any missed doses.  Medication reconciliation performed and medication/allergy list updated.  Insurance authorization for Gillermina Phy has been obtained. Test claim at the pharmacy revealed copayment [phone] for 1st fill of Xtandi. The copayment will be covered by foundation grant from [employer] that was previously secured. This will ship from the [facility] on 04/16/19 to deliver to patient's home on 12/2.  Patient informed the pharmacy will reach out 5-7 days prior to needing next fill of Xtandi to coordinate continued medication acquisition to prevent break in therapy.  All questions answered.  Mrs. Martello voiced understanding and appreciation.   Patient knows to call the office with questions or concerns.   [name], PharmD, BCPS, BCOP  04/15/2019   2:33 PM [facility] [phone]

## 2019-04-16 MED FILL — XTANDI 40 MG CAPSULE: 40 | 30 days supply | Qty: 120 | Fill #0

## 2019-04-23 ENCOUNTER — Other Ambulatory Visit: Payer: Self-pay | Admitting: *Deleted

## 2019-04-23 DIAGNOSIS — C787 Secondary malignant neoplasm of liver and intrahepatic bile duct: Secondary | ICD-10-CM

## 2019-04-23 DIAGNOSIS — C50911 Malignant neoplasm of unspecified site of right female breast: Secondary | ICD-10-CM

## 2019-04-23 NOTE — Progress Notes (Signed)
a1c

## 2019-04-24 ENCOUNTER — Other Ambulatory Visit: Payer: Self-pay

## 2019-04-24 MED ORDER — ATORVASTATIN CALCIUM 20 MG PO TABS: 20.0000 mg | ORAL_TABLET | Freq: Every day | ORAL | 9 refills | Status: AC

## 2019-04-28 NOTE — Progress Notes (Signed)
Patient Care Team: [name], MD as PCP - General (Family Medicine)  DIAGNOSIS:    ICD-10-CM   1. 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 BRCA reportedly normal.   08/25/2015 Relapse/Recurrence   new solitary liver lesion, biopsy showing metastatic ER PR + breast   09/25/2015 - 03/11/2016 Chemotherapy   Xeloda. Stopped due to progression of liver and bone metastases; pleural metastases   03/31/2016 - 07/19/2018 Anti-estrogen oral therapy   Foslodex started 03/31/16, currently monthly  Ibrance 155m 3 weeks on, 1 week off started 03/31/16 Xgeva started on 05/05/16 Letrozole daily added 08/01/17    03/20/2018 Genetic Testing   Genetic testing performed through [facility]'s Multi-Cancer reported out on 03/19/2018 showed no pathogenic mutations. The Multi-Cancer Panel offered by [facility] includes sequencing and/or deletion duplication testing of the following 91 genes: AIP, ALK, APC, ATM, AXIN2, BAP1, BARD1, BLM, BMPR1A, BRCA1, BRCA2, BRIP1, BUB1B, CASR, CDC73, CDH1, CDK4, CDKN1B, CDKN1C, CDKN2A, CEBPA, CEP57, CHEK2, CTNNA1, DICER1, DIS3L2, EGFR, ENG, EPCAM, FH, FLCN, GALNT12, GATA2, GPC3, GREM1, HOXB13, HRAS, KIT, MAX, MEN1, MET, MITF, MLH1, MLH3, MSH2, MSH3, MSH6, MUTYH, NBN, NF1, NF2, NTHL1, PALB2, PDGFRA, PHOX2B, PMS2, POLD1, POLE,  POT1, PRKAR1A, PTCH1, PTEN, RAD50, RAD51C, RAD51D, RB1, RECQL4, RET, RNF43, RPS20, RUNX1, SDHA, SDHAF2, SDHB, SDHC, SDHD, SMAD4, SMARCA4, SMARCB1, SMARCE1, STK11, SUFU, TERC, TERT, TMEM127, TP53, TSC1, TSC2, VHL, WRN, WT1  A variant of uncertain significance (VUS) in a gene called MET was also noted. [id number] (p.Thr557Ala)   04/08/2018 Miscellaneous   [facility] molecular testing: Androgen receptor positive, PDL 1 neg, MSI stable, NTRK1/2/3Neg, T MBB intermediate 9 mutations/Mb, ESR 1-, PI 3 CA negative, BRCA negative   07/20/2018 - 08/30/2018 Anti-estrogen oral therapy   Exemestane with everolimus.  Discontinued due to severe hyperglycemia   09/24/2018 -  Chemotherapy   [place] day 1 day 8 every 3 weeks    04/15/2019 Miscellaneous   Enzalutamide because the patient has AR mutation   [place]  03/27/2016 Initial Diagnosis   [place]   08/03/2017 - 01/31/2018 Chemotherapy   Foslodex started 03/31/16 monthly  Ibrance 122m3 weeks on, 1 week off started 03/31/16 Daily letrozole added 08/01/17    Breast cancer metastasized to liver, unspecified laterality [place]  03/27/2016 Initial Diagnosis   Bre[place] cancer metastas[place] to liver, [place] laterality [place]    CHIEF COMPLIANT: Follow-up of metastatic breast cancer onenzalutamide  INTERVAL HISTORY: Brittany BLUMBERGs a [age].o. with above-mentioned history of metastatic breast cancerwith metastases to thebone,liver,and lungswhois currently on treatment withenzalutamide andXgeva. She presents to the clinic today for treatment.  She is tolerating enzalutamide extremely well.  REVIEW OF SYSTEMS:   Constitutional: Denies fevers, chills or abnormal weight loss Eyes: Denies blurriness of vision Ears, nose, mouth, throat, and face: Denies mucositis or sore throat Respiratory: Denies cough,  dyspnea or wheezes Cardiovascular: Denies palpitation, chest  discomfort Gastrointestinal: Denies nausea, heartburn or change in bowel habits Skin: Denies abnormal skin rashes Lymphatics: Denies new lymphadenopathy or easy bruising Neurological: Denies numbness, tingling or new weaknesses Behavioral/Psych: Mood is stable, no new changes  Extremities: No lower extremity edema Breast: denies any pain or lumps or nodules in either breasts All other systems were reviewed with the patient and are negative.  I have reviewed the past medical history, past surgical history, social history and family history with the patient and they are unchanged from previous note.  ALLERGIES:  is allergic to prednisolone; prednisone; and sulfa antibiotics.  MEDICATIONS:  Current Outpatient Medications  Medication Sig Dispense Refill  . amiodarone (PACERONE) 200 MG tablet Take 1 tablet (200 mg total) by mouth daily. 180 tablet 0  . atorvastatin (LIPITOR) 20 MG tablet Take 1 tablet (20 mg total) by mouth daily at 6 PM. 45 tablet 9  . Calcium Carb-Cholecalciferol (CALCIUM 600+D3) 600-800 MG-UNIT TABS Take 1 tablet by mouth 2 (two) times daily.     . Cholecalciferol (VITAMIN D3) 2000 UNITS TABS Take 2,000 mg by mouth daily.    . enzalutamide (XTANDI) 40 MG capsule Take 4 capsules (160 mg total) by mouth daily. 120 capsule 3  . ferrous sulfate 325 (65 FE) MG tablet Take 325 mg by mouth daily as needed (energy).     . fish oil-omega-3 fatty acids 1000 MG capsule Take 1 g by mouth 2 (two) times daily.     . furosemide (LASIX) 20 MG tablet Take 1 tablet (20 mg total) by mouth daily. Reported on 05/07/2015 30 tablet 0  . gabapentin (NEURONTIN) 100 MG capsule Take 100 mg by mouth 2 (two) times daily.    . Ipratropium-Albuterol (COMBIVENT RESPIMAT) 20-100 MCG/ACT AERS respimat Inhale 1 puff into the lungs every 6 (six) hours. (Patient taking differently: Inhale 1 puff into the lungs every 6 (six) hours as needed for wheezing or shortness of breath. ) 4 g 6  . Magnesium Oxide  (MAG-OXIDE) 200 MG TABS Take 1 tablet (200 mg total) by mouth daily. (Patient taking differently: Take 200 mg by mouth at bedtime. ) 30 tablet 0  . metFORMIN (GLUCOPHAGE) 500 MG tablet Take 2 tablets (1,000 mg total) by mouth 2 (two) times daily with a meal. 120 tablet 0  . metoprolol succinate (TOPROL-XL) 25 MG 24 hr tablet Take 1 tablet (25 mg total) by mouth daily. 90 tablet 3  . OXYGEN Inhale 4 L into the lungs continuous.     . pantoprazole (PROTONIX) 40 MG tablet Take 1 tablet (40 mg total) by mouth 2 (two) times daily before a meal. 60 tablet 6  . sodium chloride (OCEAN) 0.65 % SOLN nasal spray Place 1 spray into both nostrils as needed for congestion.     No current facility-administered medications for this visit.    PHYSICAL EXAMINATION: ECOG PERFORMANCE STATUS: 1 - Symptomatic but completely ambulatory  Vitals:   04/29/19 1058  BP: 137/63  Pulse: 69  Resp: 17  Temp: 98.2 F (36.8 C)  SpO2: 94%   Filed Weights   04/29/19 1058  Weight: 171 lb 12.8 oz (77.9 kg)    GENERAL: alert, no distress and comfortable SKIN: skin color, texture, turgor are normal, no rashes or significant lesions EYES: normal, Conjunctiva are pink and non-injected, sclera clear OROPHARYNX: no exudate, no erythema and lips, buccal mucosa, and tongue normal  NECK: supple, thyroid normal size, non-tender, without nodularity LYMPH: no palpable lymphadenopathy  in the cervical, axillary or inguinal LUNGS: clear to auscultation and percussion with normal breathing effort HEART: regular rate & rhythm and no murmurs and no lower extremity edema ABDOMEN: abdomen soft, non-tender and normal bowel sounds MUSCULOSKELETAL: no cyanosis of digits and no clubbing  NEURO: alert & oriented x 3 with fluent speech, no focal motor/sensory deficits EXTREMITIES: No lower extremity edema  LABORATORY DATA:  I have reviewed the data as listed CMP Latest Ref Rng & Units 04/04/2019 03/07/2019 02/07/2019  Glucose 70 - 99  mg/dL 162(H) 134(H) 170(H)  BUN 8 - 23 mg/dL 25(H) 24(H) 17  Creatinine 0.44 - 1.00 mg/dL 1.35(H) 1.16(H) 1.01(H)  Sodium 135 - 145 mmol/L 142 141 140  Potassium 3.5 - 5.1 mmol/L 4.8 4.7 4.6  Chloride 98 - 111 mmol/L 101 100 100  CO2 22 - 32 mmol/L _0 Calcium 8.9 - 10.3 mg/dL 7.9(L) 9.2 7.8(L)  Total Protein 6.5 - 8.1 g/dL 6.9 6.7 6.1(L)  Total Bilirubin 0.3 - 1.2 mg/dL 0.9 0.8 0.6  Alkaline Phos 38 - 126 U/L 55 62 78  AST 15 - 41 U/L _1 ALT 0 - 44 U/L _2 Lab Results  Component Value Date   WBC 5.7 04/29/2019   HGB 10.1 (L) 04/29/2019   HCT 32.0 (L) 04/29/2019   MCV 103.6 (H) 04/29/2019   PLT 227 04/29/2019   NEUTROABS 3.3 04/29/2019    ASSESSMENT & PLAN:  Breast cancer metastasized to liver [facility]) Metastatic breast cancer: recently progressive in liver and bone, also pleura.  Faslodex + Ibrance beginning 03/31/2016-March 2020 stopped due to progression Exemestane with everolimus discontinued 08/29/2018 due to hyperglycemia  [facility] molecular testing: Androgen receptor positive, PDL 1 neg, MSI stable, NTRK1/2/3Neg, T MBB intermediate 9 mutations/Mb, ESR 1-, PI 3 CA negative, BRCA negative  Current treatment:Xeloda with Xgeva (Xeloda discontinued 04/08/2019)  Hospitalization for severe anemia hemoglobin of 2.6 received 4 units of PRBC  CT CAP: 04/04/2019: Worsening liver metastatic disease with increase in number and size of the liver masses.  Left lobe: 5.6 cm previously was 2.6 cm.  Right lobe 4.1 cm previously 4 cm, 2.8 cm was 0.8 cm.  Stable bone mets  Current treatment: Enzalutamide given the androgen receptor positivity started 04/15/2019 Enzalutamide toxicities: Denies any adverse effects to treatment.  Return to clinic in 4 weeks for follow-up with labs    Orders Placed This Encounter  Procedures  . CBC with Differential (Cancer Center Only)    Standing Status:   Future    Standing Expiration Date:   04/28/2020  . CMP ([place]  only)    Standing Status:   Future    Standing Expiration Date:   04/28/2020   The patient has a good understanding of the overall plan. she agrees with it. she will call with any problems that may develop before the next visit here.  Nicholas Lose, MD 04/29/2019  Julious Oka Dorshimer, am acting as scribe for Dr. Nicholas Lose.  I have reviewed the above document for accuracy and completeness, and I agree with the above.

## 2019-04-29 ENCOUNTER — Inpatient Hospital Stay: Payer: Medicare Other | Attending: Hematology and Oncology

## 2019-04-29 ENCOUNTER — Other Ambulatory Visit: Payer: Self-pay

## 2019-04-29 ENCOUNTER — Inpatient Hospital Stay: Payer: Medicare Other | Admitting: Hematology and Oncology

## 2019-04-29 ENCOUNTER — Other Ambulatory Visit: Payer: Self-pay | Admitting: Hematology and Oncology

## 2019-04-29 ENCOUNTER — Inpatient Hospital Stay: Payer: Medicare Other

## 2019-04-29 DIAGNOSIS — C7951 Secondary malignant neoplasm of bone: Secondary | ICD-10-CM | POA: Diagnosis not present

## 2019-04-29 DIAGNOSIS — Z79811 Long term (current) use of aromatase inhibitors: Secondary | ICD-10-CM | POA: Insufficient documentation

## 2019-04-29 DIAGNOSIS — Z79899 Other long term (current) drug therapy: Secondary | ICD-10-CM | POA: Insufficient documentation

## 2019-04-29 DIAGNOSIS — C782 Secondary malignant neoplasm of pleura: Secondary | ICD-10-CM | POA: Diagnosis not present

## 2019-04-29 DIAGNOSIS — Z9013 Acquired absence of bilateral breasts and nipples: Secondary | ICD-10-CM | POA: Insufficient documentation

## 2019-04-29 DIAGNOSIS — C50911 Malignant neoplasm of unspecified site of right female breast: Secondary | ICD-10-CM

## 2019-04-29 DIAGNOSIS — C50919 Malignant neoplasm of unspecified site of unspecified female breast: Secondary | ICD-10-CM

## 2019-04-29 DIAGNOSIS — C7800 Secondary malignant neoplasm of unspecified lung: Secondary | ICD-10-CM | POA: Insufficient documentation

## 2019-04-29 DIAGNOSIS — C787 Secondary malignant neoplasm of liver and intrahepatic bile duct: Secondary | ICD-10-CM

## 2019-04-29 DIAGNOSIS — Z9221 Personal history of antineoplastic chemotherapy: Secondary | ICD-10-CM | POA: Insufficient documentation

## 2019-04-29 DIAGNOSIS — Z7984 Long term (current) use of oral hypoglycemic drugs: Secondary | ICD-10-CM | POA: Diagnosis not present

## 2019-04-29 DIAGNOSIS — Z170 Estrogen receptor positive status [ER+]: Secondary | ICD-10-CM | POA: Diagnosis not present

## 2019-04-29 LAB — CBC WITH DIFFERENTIAL (CANCER CENTER ONLY)
Abs Immature Granulocytes: 0.01 10*3/uL (ref 0.00–0.07)
Basophils Absolute: 0 10*3/uL (ref 0.0–0.1)
Basophils Relative: 1 %
Eosinophils Absolute: 0.1 10*3/uL (ref 0.0–0.5)
Eosinophils Relative: 2 %
HCT: 32 % — ABNORMAL LOW (ref 36.0–46.0)
Hemoglobin: 10.1 g/dL — ABNORMAL LOW (ref 12.0–15.0)
Immature Granulocytes: 0 %
Lymphocytes Relative: 29 %
Lymphs Abs: 1.7 10*3/uL (ref 0.7–4.0)
MCH: 32.7 pg (ref 26.0–34.0)
MCHC: 31.6 g/dL (ref 30.0–36.0)
MCV: 103.6 fL — ABNORMAL HIGH (ref 80.0–100.0)
Monocytes Absolute: 0.6 10*3/uL (ref 0.1–1.0)
Monocytes Relative: 11 %
Neutro Abs: 3.3 10*3/uL (ref 1.7–7.7)
Neutrophils Relative %: 57 %
Platelet Count: 227 10*3/uL (ref 150–400)
RBC: 3.09 MIL/uL — ABNORMAL LOW (ref 3.87–5.11)
RDW: 20.9 % — ABNORMAL HIGH (ref 11.5–15.5)
WBC Count: 5.7 10*3/uL (ref 4.0–10.5)
nRBC: 0 % (ref 0.0–0.2)

## 2019-04-29 LAB — CMP (CANCER CENTER ONLY)
ALT: 7 U/L (ref 0–44)
AST: 20 U/L (ref 15–41)
Albumin: 3.2 g/dL — ABNORMAL LOW (ref 3.5–5.0)
Alkaline Phosphatase: 52 U/L (ref 38–126)
Anion gap: 11 (ref 5–15)
BUN: 27 mg/dL — ABNORMAL HIGH (ref 8–23)
CO2: 28 mmol/L (ref 22–32)
Calcium: 8 mg/dL — ABNORMAL LOW (ref 8.9–10.3)
Chloride: 103 mmol/L (ref 98–111)
Creatinine: 1.3 mg/dL — ABNORMAL HIGH (ref 0.44–1.00)
GFR, Est AFR Am: 46 mL/min — ABNORMAL LOW (ref >60)
GFR, Estimated: 40 mL/min — ABNORMAL LOW (ref >60)
Glucose, Bld: 131 mg/dL — ABNORMAL HIGH (ref 70–99)
Potassium: 4.8 mmol/L (ref 3.5–5.1)
Sodium: 142 mmol/L (ref 135–145)
Total Bilirubin: 0.6 mg/dL (ref 0.3–1.2)
Total Protein: 6.7 g/dL (ref 6.5–8.1)

## 2019-04-29 LAB — HEMOGLOBIN A1C
Hgb A1c MFr Bld: 6.5 % — ABNORMAL HIGH (ref 4.8–5.6)
Mean Plasma Glucose: 139.85 mg/dL

## 2019-04-29 MED ORDER — DENOSUMAB 120 MG/1.7ML ~~LOC~~ SOLN
SUBCUTANEOUS | Status: AC
  Filled 2019-04-29: qty 1.7

## 2019-04-29 MED ORDER — DENOSUMAB 120 MG/1.7ML ~~LOC~~ SOLN
120.0000 mg | Freq: Once | SUBCUTANEOUS | Status: AC
  Administered 2019-04-29: 120 mg via SUBCUTANEOUS

## 2019-04-29 NOTE — Patient Instructions (Signed)
Denosumab injection What is this medicine? DENOSUMAB (den oh sue mab) slows bone breakdown. Prolia is used to treat osteoporosis in women after menopause and in men, and in people who are taking corticosteroids for 6 months or more. Xgeva is used to treat a high calcium level due to cancer and to prevent bone fractures and other bone problems caused by multiple myeloma or cancer bone metastases. Xgeva is also used to treat giant cell tumor of the bone. This medicine may be used for other purposes; ask your health care provider or pharmacist if you have questions. COMMON BRAND NAME(S): Prolia, XGEVA What should I tell my health care provider before I take this medicine? They need to know if you have any of these conditions:  dental disease  having surgery or tooth extraction  infection  kidney disease  low levels of calcium or Vitamin D in the blood  malnutrition  on hemodialysis  skin conditions or sensitivity  thyroid or parathyroid disease  an unusual reaction to denosumab, other medicines, foods, dyes, or preservatives  pregnant or trying to get pregnant  breast-feeding How should I use this medicine? This medicine is for injection under the skin. It is given by a health care professional in a hospital or clinic setting. A special MedGuide will be given to you before each treatment. Be sure to read this information carefully each time. For Prolia, talk to your pediatrician regarding the use of this medicine in children. Special care may be needed. For Xgeva, talk to your pediatrician regarding the use of this medicine in children. While this drug may be prescribed for children as young as 13 years for selected conditions, precautions do apply. Overdosage: If you think you have taken too much of this medicine contact a poison control center or emergency room at once. NOTE: This medicine is only for you. Do not share this medicine with others. What if I miss a dose? It is  important not to miss your dose. Call your doctor or health care professional if you are unable to keep an appointment. What may interact with this medicine? Do not take this medicine with any of the following medications:  other medicines containing denosumab This medicine may also interact with the following medications:  medicines that lower your chance of fighting infection  steroid medicines like prednisone or cortisone This list may not describe all possible interactions. Give your health care provider a list of all the medicines, herbs, non-prescription drugs, or dietary supplements you use. Also tell them if you smoke, drink alcohol, or use illegal drugs. Some items may interact with your medicine. What should I watch for while using this medicine? Visit your doctor or health care professional for regular checks on your progress. Your doctor or health care professional may order blood tests and other tests to see how you are doing. Call your doctor or health care professional for advice if you get a fever, chills or sore throat, or other symptoms of a cold or flu. Do not treat yourself. This drug may decrease your body's ability to fight infection. Try to avoid being around people who are sick. You should make sure you get enough calcium and vitamin D while you are taking this medicine, unless your doctor tells you not to. Discuss the foods you eat and the vitamins you take with your health care professional. See your dentist regularly. Brush and floss your teeth as directed. Before you have any dental work done, tell your dentist you are   receiving this medicine. Do not become pregnant while taking this medicine or for 5 months after stopping it. Talk with your doctor or health care professional about your birth control options while taking this medicine. Women should inform their doctor if they wish to become pregnant or think they might be pregnant. There is a potential for serious side  effects to an unborn child. Talk to your health care professional or pharmacist for more information. What side effects may I notice from receiving this medicine? Side effects that you should report to your doctor or health care professional as soon as possible:  allergic reactions like skin rash, itching or hives, swelling of the face, lips, or tongue  bone pain  breathing problems  dizziness  jaw pain, especially after dental work  redness, blistering, peeling of the skin  signs and symptoms of infection like fever or chills; cough; sore throat; pain or trouble passing urine  signs of low calcium like fast heartbeat, muscle cramps or muscle pain; pain, tingling, numbness in the hands or feet; seizures  unusual bleeding or bruising  unusually weak or tired Side effects that usually do not require medical attention (report to your doctor or health care professional if they continue or are bothersome):  constipation  diarrhea  headache  joint pain  loss of appetite  muscle pain  runny nose  tiredness  upset stomach This list may not describe all possible side effects. Call your doctor for medical advice about side effects. You may report side effects to FDA at 1-800-FDA-1088. Where should I keep my medicine? This medicine is only given in a clinic, doctor's office, or other health care setting and will not be stored at home. NOTE: This sheet is a summary. It may not cover all possible information. If you have questions about this medicine, talk to your doctor, pharmacist, or health care provider.  2020 Elsevier/Gold Standard (2017-09-08 16:10:44)

## 2019-04-29 NOTE — Assessment & Plan Note (Signed)
Metastatic breast cancer: recently progressive in liver and bone, also pleura.  Faslodex + Ibrance beginning 03/31/2016-March 2020 stopped due to progression Exemestane with everolimus discontinued 08/29/2018 due to hyperglycemia  [facility] molecular testing: Androgen receptor positive, PDL 1 neg, MSI stable, NTRK1/2/3Neg, T MBB intermediate 9 mutations/Mb, ESR 1-, PI 3 CA negative, BRCA negative  Current treatment:Xeloda with Xgeva (Xeloda discontinued 04/08/2019)  Hospitalization for severe anemia hemoglobin of 2.6 received 4 units of PRBC  CT CAP: 04/04/2019: Worsening liver metastatic disease with increase in number and size of the liver masses.  Left lobe: 5.6 cm previously was 2.6 cm.  Right lobe 4.1 cm previously 4 cm, 2.8 cm was 0.8 cm.  Stable bone mets  Current treatment: Enzalutamide given the androgen receptor positivity started 04/15/2019 Enzalutamide toxicities:  Return to clinic in 4 weeks for follow-up with labs

## 2019-04-29 NOTE — Progress Notes (Signed)
Per MD ok to give with calcium 8.0.

## 2019-04-30 ENCOUNTER — Telehealth: Payer: Self-pay | Admitting: Hematology and Oncology

## 2019-04-30 NOTE — Telephone Encounter (Signed)
I left a message regarding schedule  

## 2019-05-03 ENCOUNTER — Other Ambulatory Visit (HOSPITAL_COMMUNITY)
Admission: RE | Admit: 2019-05-03 | Discharge: 2019-05-03 | Disposition: A | Discharge status: Home / Self Care | Payer: Medicare Other | Source: Ambulatory Visit | Attending: Pulmonary Disease | Admitting: Pulmonary Disease

## 2019-05-03 DIAGNOSIS — Z01812 Encounter for preprocedural laboratory examination: Secondary | ICD-10-CM | POA: Diagnosis present

## 2019-05-03 DIAGNOSIS — Z20828 Contact with and (suspected) exposure to other viral communicable diseases: Secondary | ICD-10-CM | POA: Insufficient documentation

## 2019-05-03 LAB — SARS CORONAVIRUS 2 (TAT 6-24 HRS): SARS Coronavirus 2: NEGATIVE

## 2019-05-06 ENCOUNTER — Encounter: Payer: Self-pay | Admitting: Primary Care

## 2019-05-06 ENCOUNTER — Ambulatory Visit: Payer: Medicare Other | Admitting: Primary Care

## 2019-05-06 ENCOUNTER — Ambulatory Visit: Payer: Medicare Other | Admitting: Acute Care

## 2019-05-06 ENCOUNTER — Ambulatory Visit (INDEPENDENT_AMBULATORY_CARE_PROVIDER_SITE_OTHER): Payer: Medicare Other | Admitting: Pulmonary Disease

## 2019-05-06 ENCOUNTER — Other Ambulatory Visit: Payer: Self-pay

## 2019-05-06 VITALS — BP 120/80 | HR 71 | Temp 97.2°F | Ht 63.0 in | Wt 172.0 lb

## 2019-05-06 DIAGNOSIS — R0602 Shortness of breath: Secondary | ICD-10-CM

## 2019-05-06 DIAGNOSIS — J9611 Chronic respiratory failure with hypoxia: Secondary | ICD-10-CM

## 2019-05-06 DIAGNOSIS — R0600 Dyspnea, unspecified: Secondary | ICD-10-CM | POA: Insufficient documentation

## 2019-05-06 LAB — PULMONARY FUNCTION TEST
DL/VA % pred: 120 %
DL/VA: 4.96 ml/min/mmHg/L
DLCO cor % pred: 62 %
DLCO cor: 11.6 ml/min/mmHg
DLCO unc % pred: 55 %
DLCO unc: 10.23 ml/min/mmHg
FEF 25-75 Post: 1.98 L/sec
FEF 25-75 Pre: 0.94 L/sec
FEF2575-%Change-Post: 109 %
FEF2575-%Pred-Post: 123 %
FEF2575-%Pred-Pre: 58 %
FEV1-%Change-Post: 16 %
FEV1-%Pred-Post: 69 %
FEV1-%Pred-Pre: 60 %
FEV1-Post: 1.41 L
FEV1-Pre: 1.21 L
FEV1FVC-%Change-Post: 14 %
FEV1FVC-%Pred-Pre: 101 %
FEV6-%Change-Post: 2 %
FEV6-%Pred-Post: 63 %
FEV6-%Pred-Pre: 62 %
FEV6-Post: 1.62 L
FEV6-Pre: 1.59 L
FEV6FVC-%Pred-Post: 105 %
FEV6FVC-%Pred-Pre: 105 %
FVC-%Change-Post: 2 %
FVC-%Pred-Post: 60 %
FVC-%Pred-Pre: 58 %
FVC-Post: 1.62 L
FVC-Pre: 1.59 L
Post FEV1/FVC ratio: 87 %
Post FEV6/FVC ratio: 100 %
Pre FEV1/FVC ratio: 76 %
Pre FEV6/FVC Ratio: 100 %
RV % pred: 63 %
RV: 1.43 L
TLC % pred: 59 %
TLC: 2.91 L

## 2019-05-06 NOTE — Addendum Note (Signed)
Addended by: Lia Foyer R on: 05/06/2019 02:15 PM   Modules accepted: Orders

## 2019-05-06 NOTE — Assessment & Plan Note (Signed)
-   Improved; no current respiratory symptoms - PFTs 05/06/2019 showed mild-moderate restriction, no obstruction. +BD response. Decreased diffusion capacity which corrects for lung volume (Ratio 87, FEV1 1.41 69%)  - Continue PRN combivent 1 puff every 6 hours for sob/wheezing  - No need for maintenance inhaler at this time  - FU in 8-12 months or as needed

## 2019-05-06 NOTE — Progress Notes (Signed)
Full PFT performed today. °

## 2019-05-06 NOTE — Patient Instructions (Addendum)
Pleasure meeting you [name], I am glad you are doing well   Orders: Discontinue oxygen   Recommendations: Use combivent rescue inhaler 1 puff every 6 hours as needed for breakthrough shortness of breath/wheezing   Follow-up: 8-12 months with Dr. Loanne Drilling or sooner if respiratory symptoms worsen

## 2019-05-06 NOTE — Assessment & Plan Note (Addendum)
-   Improved; no longer requiring supplemental oxygen - No desaturation with ambulatory walk- maintained >94% RA  - Discontinue oxygen with DME company

## 2019-05-06 NOTE — Assessment & Plan Note (Signed)
-   Most recent CT chest showed worsening metastatic disease - Following with Oncology, next visit January 11th

## 2019-05-06 NOTE — Progress Notes (Signed)
@Patient  ID: Brittany Porter, female    DOB: March 08, 1946, 73 y.o.   MRN: [id number]  Chief Complaint  Patient presents with  . Follow-up    Patient is here for PFT and ROV.     Referring provider: [name], MD  HPI: 73 year old female, never smoked. PMH significant for afib (on anticoagulation), systolic HF, metastatic breast cancer to liver/bone/pleura, endometrial carcinoma, melanoma, type 2 diabetes, obesity. Patient of Dr. Loanne Drilling, seen for initial consult on 11/27/18 for worsening dyspnea since January and hypoxic respiratory failure.   05/06/2019 Patient presents today for 6 months follow-up with PFTs. Breathing has been fine, states that she checks her oxygen level in the morning and they have been within a normal range. She has not had to use her oxygen in a while. She was using her combivent rescue inhaler every day now only requires it once a month. Her only complaint is arthritis. Denies cough, wheezing, chest tightness, chest pain, abdominal pain, N/V/D. Next apt with oncology Jan 11th.  Imaging: CT CAP 07/17/18 - Progressive liver cancer with increased number of hepatic and osseous lesions. Left basilar opacities/atelectasis with surrounding thickened pleural with calcification  CXR 11/27/18 - Compared to prior imaging, interval developed of mild ground glass opacity on right side, left pleural thickening/atelectasis unchanged, cardiomegaly similar  04/04/19 CT chest-  Thickened pleura on the left with calcification, query pleurodesis, prior hemothorax, or prior empyema. Mildly increased subpleural nodularity RUL. The scattered ground-glass opacities in the lungs have resolved. Continued bandlike in nodular thickening and volume loss in the lower lingula.Increasing peripheral masslike density in the left lower lobe- appearance is more suspicious for active malignancy.   PFT: 05/06/2019- FVC 1.62 (60%), FEV1 1.41 (69), ratio 87, +BD, decreased DLCO corrects for lung  vol  Labs: CBC and CMP 11/09/18 reviewed. Significant for Hg 10 and Cr 1.96  TTE: 01/30/18-EF 45-50%, diffuse hypokinesis including inferolateral myocardium, diastolic dysfunction. Normal valves and RV size.  Oxygen Titration 11/27/18 SATURATION QUALIFICATIONS: (Thisnote is usedto comply with regulatory documentation for home oxygen)  Patient Saturations on Room Air at Rest = 93%  Patient Saturations on Room Air while Ambulating = 81%  Patient Saturations on 4Liters of oxygen while Ambulating = 94%    Allergies  Allergen Reactions  . Prednisolone Shortness Of Breath and Other (See Comments)     increased BP  . Prednisone Anaphylaxis  . Sulfa Antibiotics Swelling    "Eyes swelled shut" - reaction to eye drops    Immunization History  Administered Date(s) Administered  . Fluad Quad(high Dose 65+) 03/07/2019  . Influenza Split 01/24/2014, 03/04/2015  . Influenza, High Dose Seasonal PF 03/02/2017  . Influenza,inj,Quad PF,6+ Mos 03/06/2013, 02/08/2018  . Influenza-Unspecified 01/06/2016  . Pneumococcal-Unspecified 01/24/2014    Past Medical History:  Diagnosis Date  . Arthritis   . Breast cancer ([place])   . Diabetes mellitus   . Endometrial carcinoma ([place]) 10/2008  . Family history of breast cancer   . Family history of colon cancer   . Family history of skin cancer   . Fluid overload   . History of radiation therapy 04/13/2009, 04/16/2009, 04/27/2009, 05/07/2009, 05/18/2009   3000 cGy to proximal vagina  . Hyperlipidemia   . Iron deficiency   . Melanoma ([place]) 2010   rt arm and back  . Ovarian cancer ([place])   . Pleural effusion 04/2010    Tobacco History: Social History   Tobacco Use  Smoking Status Never Smoker  Smokeless Tobacco Never Used  Counseling given: Not Answered   Outpatient Medications Prior to Visit  Medication Sig Dispense Refill  . amiodarone (PACERONE) 200 MG tablet Take 1 tablet (200 mg total) by mouth daily. 180 tablet 0  .  atorvastatin (LIPITOR) 20 MG tablet Take 1 tablet (20 mg total) by mouth daily at 6 PM. 45 tablet 9  . Calcium Carb-Cholecalciferol (CALCIUM 600+D3) 600-800 MG-UNIT TABS Take 1 tablet by mouth 2 (two) times daily.     . Cholecalciferol (VITAMIN D3) 2000 UNITS TABS Take 2,000 mg by mouth daily.    . enzalutamide (XTANDI) 40 MG capsule Take 4 capsules (160 mg total) by mouth daily. 120 capsule 3  . ferrous sulfate 325 (65 FE) MG tablet Take 325 mg by mouth daily as needed (energy).     . fish oil-omega-3 fatty acids 1000 MG capsule Take 1 g by mouth 2 (two) times daily.     . furosemide (LASIX) 20 MG tablet Take 1 tablet (20 mg total) by mouth daily. Reported on 05/07/2015 30 tablet 0  . gabapentin (NEURONTIN) 100 MG capsule Take 100 mg by mouth 2 (two) times daily.    . Ipratropium-Albuterol (COMBIVENT RESPIMAT) 20-100 MCG/ACT AERS respimat Inhale 1 puff into the lungs every 6 (six) hours. (Patient taking differently: Inhale 1 puff into the lungs every 6 (six) hours as needed for wheezing or shortness of breath. ) 4 g 6  . Magnesium Oxide (MAG-OXIDE) 200 MG TABS Take 1 tablet (200 mg total) by mouth daily. (Patient taking differently: Take 200 mg by mouth at bedtime. ) 30 tablet 0  . metFORMIN (GLUCOPHAGE) 500 MG tablet Take 2 tablets (1,000 mg total) by mouth 2 (two) times daily with a meal. 120 tablet 0  . metoprolol succinate (TOPROL-XL) 25 MG 24 hr tablet Take 1 tablet (25 mg total) by mouth daily. 90 tablet 3  . OXYGEN Inhale 4 L into the lungs continuous.     . pantoprazole (PROTONIX) 40 MG tablet Take 1 tablet (40 mg total) by mouth 2 (two) times daily before a meal. 60 tablet 6  . sodium chloride (OCEAN) 0.65 % SOLN nasal spray Place 1 spray into both nostrils as needed for congestion.     No facility-administered medications prior to visit.    Review of Systems  Review of Systems  Constitutional: Negative.   Respiratory: Negative for cough, shortness of breath and wheezing.    Cardiovascular: Negative.   Gastrointestinal: Negative for abdominal pain, nausea and vomiting.  Musculoskeletal: Positive for arthralgias.    Physical Exam  BP 120/80 (BP Location: Left Arm, Patient Position: Sitting, Cuff Size: Large)   Pulse 71   Temp (!) 97.2 F (36.2 C) (Temporal)   Ht 5\' 3"  (1.6 m)   Wt 172 lb (78 kg)   SpO2 95% Comment: on RA  BMI 30.47 kg/m  Physical Exam Constitutional:      Appearance: Normal appearance.  HENT:     Head: Normocephalic and atraumatic.     Mouth/Throat:     Comments: Deferred d/t masking Cardiovascular:     Rate and Rhythm: Normal rate and regular rhythm.  Pulmonary:     Effort: Pulmonary effort is normal.     Breath sounds: Normal breath sounds.  Skin:    General: Skin is warm and dry.  Neurological:     General: No focal deficit present.     Mental Status: She is alert and oriented to person, place, and time. Mental status is at baseline.  Psychiatric:  Mood and Affect: Mood normal.        Behavior: Behavior normal.        Thought Content: Thought content normal.        Judgment: Judgment normal.      Lab Results:  CBC    Component Value Date/Time   WBC 5.7 04/29/2019 1044   WBC 4.0 01/03/2019 0500   RBC 3.09 (L) 04/29/2019 1044   HGB 10.1 (L) 04/29/2019 1044   HGB 11.8 05/02/2017 1047   HCT 32.0 (L) 04/29/2019 1044   HCT 28.7 (L) 12/29/2018 1349   HCT 35.2 05/02/2017 1047   PLT 227 04/29/2019 1044   PLT 148 05/02/2017 1047   MCV 103.6 (H) 04/29/2019 1044   MCV 103.9 (H) 05/02/2017 1047   MCH 32.7 04/29/2019 1044   MCHC 31.6 04/29/2019 1044   RDW 20.9 (H) 04/29/2019 1044   RDW 15.1 (H) 05/02/2017 1047   LYMPHSABS 1.7 04/29/2019 1044   LYMPHSABS 0.8 (L) 05/02/2017 1047   MONOABS 0.6 04/29/2019 1044   MONOABS 0.3 05/02/2017 1047   EOSABS 0.1 04/29/2019 1044   EOSABS 0.0 05/02/2017 1047   EOSABS 0.1 11/17/2009 1136   BASOSABS 0.0 04/29/2019 1044   BASOSABS 0.0 05/02/2017 1047    BMET     Component Value Date/Time   NA 142 04/29/2019 1044   NA 141 04/19/2018 1103   NA 141 05/02/2017 1047   K 4.8 04/29/2019 1044   K 4.6 05/02/2017 1047   CL 103 04/29/2019 1044   CL 102 07/12/2012 0956   CO2 28 04/29/2019 1044   CO2 30 (H) 05/02/2017 1047   GLUCOSE 131 (H) 04/29/2019 1044   GLUCOSE 141 (H) 05/02/2017 1047   GLUCOSE 133 (H) 07/12/2012 0956   BUN 27 (H) 04/29/2019 1044   BUN 18 04/19/2018 1103   BUN 21.1 05/02/2017 1047   CREATININE 1.30 (H) 04/29/2019 1044   CREATININE 0.8 05/02/2017 1047   CALCIUM 8.0 (L) 04/29/2019 1044   CALCIUM 8.8 05/02/2017 1047   GFRNONAA 40 (L) 04/29/2019 1044   GFRAA 46 (L) 04/29/2019 1044    BNP    Component Value Date/Time   BNP 76.7 08/24/2018 1344    ProBNP No results found for: PROBNP  Imaging: No results found.   Assessment & Plan:   Chronic respiratory failure with hypoxia (HCC) - Improved; no longer requiring supplemental oxygen - No desaturation with ambulatory walk- maintained >94% RA  - Discontinue oxygen with DME company   Dyspnea - Improved; no current respiratory symptoms - PFTs 05/06/2019 showed mild-moderate restriction, no obstruction. +BD response. Decreased diffusion capacity which corrects for lung volume (Ratio 87, FEV1 1.41 69%)  - Continue PRN combivent 1 puff every 6 hours for sob/wheezing  - No need for maintenance inhaler at this time  - FU in 8-12 months or as needed   Breast cancer metastasized to liver [facility]) - Most recent CT chest showed worsening metastatic disease - Following with Oncology, next visit January 11th    Martyn Ehrich, NP 05/06/2019

## 2019-05-07 ENCOUNTER — Telehealth: Payer: Self-pay | Admitting: Pulmonary Disease

## 2019-05-07 ENCOUNTER — Ambulatory Visit (HOSPITAL_COMMUNITY): Payer: Medicare Other | Admitting: Nurse Practitioner

## 2019-05-07 NOTE — Telephone Encounter (Signed)
Order was placed yesterday 12/21 after OV. Called and spoke with pt in regards to this and pt said she received a call from DME stating they were going to come by tomorrow 12/23 to pick up O2. Nothing further needed.

## 2019-05-15 ENCOUNTER — Other Ambulatory Visit: Payer: Self-pay

## 2019-05-15 MED ORDER — PANTOPRAZOLE SODIUM 40 MG PO TBEC: 40.0000 mg | DELAYED_RELEASE_TABLET | Freq: Two times a day (BID) | ORAL | 6 refills | Status: AC

## 2019-05-15 MED FILL — XTANDI 40 MG CAPSULE: 40 | 30 days supply | Qty: 120 | Fill #1

## 2019-05-16 ENCOUNTER — Encounter (HOSPITAL_COMMUNITY): Payer: Self-pay | Admitting: Nurse Practitioner

## 2019-05-16 ENCOUNTER — Ambulatory Visit (HOSPITAL_COMMUNITY)
Admission: RE | Admit: 2019-05-16 | Discharge: 2019-05-16 | Disposition: A | Discharge status: Home / Self Care | Payer: Medicare Other | Source: Ambulatory Visit | Attending: Nurse Practitioner | Admitting: Nurse Practitioner

## 2019-05-16 ENCOUNTER — Other Ambulatory Visit: Payer: Self-pay

## 2019-05-16 VITALS — BP 132/74 | HR 63 | Ht 63.0 in | Wt 174.0 lb

## 2019-05-16 DIAGNOSIS — Z808 Family history of malignant neoplasm of other organs or systems: Secondary | ICD-10-CM | POA: Insufficient documentation

## 2019-05-16 DIAGNOSIS — I480 Paroxysmal atrial fibrillation: Secondary | ICD-10-CM

## 2019-05-16 DIAGNOSIS — Z8543 Personal history of malignant neoplasm of ovary: Secondary | ICD-10-CM | POA: Insufficient documentation

## 2019-05-16 DIAGNOSIS — Z923 Personal history of irradiation: Secondary | ICD-10-CM | POA: Insufficient documentation

## 2019-05-16 DIAGNOSIS — Z823 Family history of stroke: Secondary | ICD-10-CM | POA: Diagnosis not present

## 2019-05-16 DIAGNOSIS — Z800 Family history of malignant neoplasm of digestive organs: Secondary | ICD-10-CM | POA: Insufficient documentation

## 2019-05-16 DIAGNOSIS — Z7984 Long term (current) use of oral hypoglycemic drugs: Secondary | ICD-10-CM | POA: Diagnosis not present

## 2019-05-16 DIAGNOSIS — E119 Type 2 diabetes mellitus without complications: Secondary | ICD-10-CM | POA: Diagnosis not present

## 2019-05-16 DIAGNOSIS — Z85820 Personal history of malignant melanoma of skin: Secondary | ICD-10-CM | POA: Diagnosis not present

## 2019-05-16 DIAGNOSIS — Z79899 Other long term (current) drug therapy: Secondary | ICD-10-CM | POA: Diagnosis not present

## 2019-05-16 DIAGNOSIS — Z8542 Personal history of malignant neoplasm of other parts of uterus: Secondary | ICD-10-CM | POA: Diagnosis not present

## 2019-05-16 DIAGNOSIS — Z8249 Family history of ischemic heart disease and other diseases of the circulatory system: Secondary | ICD-10-CM | POA: Diagnosis not present

## 2019-05-16 DIAGNOSIS — E611 Iron deficiency: Secondary | ICD-10-CM | POA: Diagnosis not present

## 2019-05-16 DIAGNOSIS — I4892 Unspecified atrial flutter: Secondary | ICD-10-CM | POA: Diagnosis not present

## 2019-05-16 DIAGNOSIS — Z888 Allergy status to other drugs, medicaments and biological substances status: Secondary | ICD-10-CM | POA: Diagnosis not present

## 2019-05-16 DIAGNOSIS — Z803 Family history of malignant neoplasm of breast: Secondary | ICD-10-CM | POA: Insufficient documentation

## 2019-05-16 DIAGNOSIS — Z853 Personal history of malignant neoplasm of breast: Secondary | ICD-10-CM | POA: Insufficient documentation

## 2019-05-16 DIAGNOSIS — E785 Hyperlipidemia, unspecified: Secondary | ICD-10-CM | POA: Diagnosis not present

## 2019-05-16 DIAGNOSIS — Z882 Allergy status to sulfonamides status: Secondary | ICD-10-CM | POA: Insufficient documentation

## 2019-05-16 DIAGNOSIS — I4891 Unspecified atrial fibrillation: Secondary | ICD-10-CM | POA: Diagnosis present

## 2019-05-16 DIAGNOSIS — Z833 Family history of diabetes mellitus: Secondary | ICD-10-CM | POA: Diagnosis not present

## 2019-05-16 NOTE — Progress Notes (Addendum)
Primary Care Physician: [name], MD Referring Physician: [facility] ER f/u   Brittany Porter is a 73 y.o. female with a h/o Breast CA with  mets to the [place] found 03/31/16, on Faslodex and Ibrance, DM, that was in the ER, 01/09/18, for new onset of atrial flutter with RVR. V rate was around 170 bpm. The pt did not feel the episode but it was picked up on routine check of V/S. She  was not started on anticoagulation for concerns of her therapy for h/o breast cancer. CHA2DS2VASc score was 3. She was found to be in typical atrial flutter in the ER. When I initially  saw her 01/16/18,  she was in rhythm.  No tobacco, alcohol, snoring. Is sedentary and obese. She was  started on eliquis.  She saw Dr. Rayann Heman in f/u but was not interested in a flutter ablation. She returned to the ER, 06/06/18 and had a cardioversion for afib with RVic and found to have   afib with RVR around 170 bpm. BP  soft at 811 asystolic. She was tolerating well but discussed with  Dr. Caryl Comes, that saw her in the clinic as well today , and discussed that  hospitalization would be probably be best way to further treat arrhythmia. She is in agreement.  F/u in afib clinic. 3/5. She continued on amiodarone 200 mg daily. She is in SR and has not noted any afib. She has no complaints.  F/u  6/22, she remains in SR. Is taking a chemo drug for her mets that pt states is aggravating her BS. Otherwise, no complaints, has not noted any afib.  F/u in afib clinic, 05/16/19. She reports that she has not noted any afib. She was taken off xarelto in August 2020, as she had a GI bleed  at that time that required 4 units of blood.  She remains off and was not told to go back on.  She remains on amiodarone 200 mg daily. Recent cmet with normal liver enzymes, TSH not checked recently per Epic. Pt feels it was checked with her PCP and will try to results faxed over.   Today, she denies symptoms of palpitations, chest pain, shortness of breath, orthopnea,  PND, lower extremity edema, dizziness, presyncope, syncope, or neurologic sequela. The patient is tolerating medications without difficulties and is otherwise without complaint today.   Past Medical History:  Diagnosis Date  . Arthritis   . Breast cancer ([place])   . Diabetes mellitus   . Endometrial carcinoma ([place]) 10/2008  . Family history of breast cancer   . Family history of colon cancer   . Family history of skin cancer   . Fluid overload   . History of radiation therapy 04/13/2009, 04/16/2009, 04/27/2009, 05/07/2009, 05/18/2009   3000 cGy to proximal vagina  . Hyperlipidemia   . Iron deficiency   . Melanoma ([place]) 2010   rt arm and back  . Ovarian cancer ([place])   . Pleural effusion 04/2010   Past Surgical History:  Procedure Laterality Date  . ABDOMINAL HYSTERECTOMY  10/2008  . APPENDECTOMY    . ESOPHAGOGASTRODUODENOSCOPY (EGD) WITH PROPOFOL N/A 12/30/2018   Procedure: ESOPHAGOGASTRODUODENOSCOPY (EGD) WITH PROPOFOL;  Surgeon: Ronald Lobo, MD;  Location: [place];  Service: Endoscopy;  Laterality: N/A;  . INSERTION OF MESH  05/28/2012   Procedure: INSERTION OF MESH;  Surgeon: Adin Hector, MD;  Location: [place];  Service: General;  Laterality: N/A;  . KNEE ARTHROSCOPY  04/2010   Right knee  .  LAPAROSCOPIC LYSIS OF ADHESIONS  05/28/2012   Procedure: LAPAROSCOPIC LYSIS OF ADHESIONS;  Surgeon: Adin Hector, MD;  Location: [place];  Service: General;  Laterality: N/A;  . MASTECTOMY  1997   Bilateral with lymph nodes  . Melanoma removal  02/2009, 07/2009  . VENTRAL HERNIA REPAIR  05/28/2012   Procedure: LAPAROSCOPIC VENTRAL HERNIA;  Surgeon: Adin Hector, MD;  Location: [place];  Service: General;  Laterality: N/A;    Current Outpatient Medications  Medication Sig Dispense Refill  . ACCU-CHEK AVIVA PLUS test strip     . Accu-Chek FastClix Lancets MISC     . amiodarone (PACERONE) 200 MG tablet Take 1 tablet (200 mg total) by mouth daily. 180 tablet 0  . atorvastatin  (LIPITOR) 20 MG tablet Take 1 tablet (20 mg total) by mouth daily at 6 PM. 45 tablet 9  . Calcium Carb-Cholecalciferol (CALCIUM 600+D3) 600-800 MG-UNIT TABS Take 1 tablet by mouth 2 (two) times daily.     . Cholecalciferol (VITAMIN D3) 2000 UNITS TABS Take 2,000 mg by mouth daily.    . enzalutamide (XTANDI) 40 MG capsule Take 4 capsules (160 mg total) by mouth daily. 120 capsule 3  . ferrous sulfate 325 (65 FE) MG tablet Take 325 mg by mouth daily as needed (energy).     . fish oil-omega-3 fatty acids 1000 MG capsule Take 1 g by mouth 2 (two) times daily.     . furosemide (LASIX) 20 MG tablet Take 1 tablet (20 mg total) by mouth daily. Reported on 05/07/2015 30 tablet 0  . gabapentin (NEURONTIN) 100 MG capsule Take 100 mg by mouth 2 (two) times daily.    . Ipratropium-Albuterol (COMBIVENT RESPIMAT) 20-100 MCG/ACT AERS respimat Inhale 1 puff into the lungs every 6 (six) hours. (Patient taking differently: Inhale 1 puff into the lungs every 6 (six) hours as needed for wheezing or shortness of breath. ) 4 g 6  . Magnesium Oxide (MAG-OXIDE) 200 MG TABS Take 1 tablet (200 mg total) by mouth daily. (Patient taking differently: Take 200 mg by mouth at bedtime. ) 30 tablet 0  . metFORMIN (GLUCOPHAGE) 500 MG tablet Take 2 tablets (1,000 mg total) by mouth 2 (two) times daily with a meal. 120 tablet 0  . metoprolol succinate (TOPROL-XL) 25 MG 24 hr tablet Take 1 tablet (25 mg total) by mouth daily. 90 tablet 3  . pantoprazole (PROTONIX) 40 MG tablet Take 1 tablet (40 mg total) by mouth 2 (two) times daily before a meal. 60 tablet 6  . sodium chloride (OCEAN) 0.65 % SOLN nasal spray Place 1 spray into both nostrils as needed for congestion.     No current facility-administered medications for this encounter.    Allergies  Allergen Reactions  . Prednisolone Shortness Of Breath and Other (See Comments)     increased BP  . Prednisone Anaphylaxis  . Sulfa Antibiotics Swelling    "Eyes swelled shut" -  reaction to eye drops    Social History   Socioeconomic History  . Marital status: Married    Spouse name: Not on file  . Number of children: Not on file  . Years of education: Not on file  . Highest education level: Not on file  Occupational History  . Not on file  Tobacco Use  . Smoking status: Never Smoker  . Smokeless tobacco: Never Used  Substance and Sexual Activity  . Alcohol use: No  . Drug use: No  . Sexual activity: Yes  Other Topics  Concern  . Not on file  Social History Narrative  . Not on file   Social Determinants of Health   Financial Resource Strain:   . Difficulty of Paying Living Expenses: Not on file  Food Insecurity:   . Worried About Charity fundraiser in the Last Year: Not on file  . Ran Out of Food in the Last Year: Not on file  Transportation Needs:   . Lack of Transportation (Medical): Not on file  . Lack of Transportation (Non-Medical): Not on file  Physical Activity:   . Days of Exercise per Week: Not on file  . Minutes of Exercise per Session: Not on file  Stress:   . Feeling of Stress : Not on file  Social Connections:   . Frequency of Communication with Friends and Family: Not on file  . Frequency of Social Gatherings with Friends and Family: Not on file  . Attends Religious Services: Not on file  . Active Member of Clubs or Organizations: Not on file  . Attends Archivist Meetings: Not on file  . Marital Status: Not on file  Intimate Partner Violence:   . Fear of Current or Ex-Partner: Not on file  . Emotionally Abused: Not on file  . Physically Abused: Not on file  . Sexually Abused: Not on file    Family History  Problem Relation Age of Onset  . Stroke Mother   . Cancer Father        'voice box'  . Colon cancer Sister        dx >50  . Heart attack Brother   . Skin cancer Brother 21  . Cancer Sister        primary site unk, dx >50  . Diabetes Brother   . Stroke Brother   . Stroke Sister   . ALS Sister   .  Colon cancer Sister 92  . Dementia Sister   . Breast cancer Sister        dx >5-, met to brain    ROS- All systems are reviewed and negative except as per the HPI above  Physical Exam: Vitals:   05/16/19 1056  BP: 132/74  Pulse: 63  Weight: 78.9 kg  Height: '5\' 3"'  (1.6 m)   Wt Readings from Last 3 Encounters:  05/16/19 78.9 kg  05/06/19 78 kg  04/29/19 77.9 kg    Labs: Lab Results  Component Value Date   NA 142 04/29/2019   K 4.8 04/29/2019   CL 103 04/29/2019   CO2 28 04/29/2019   GLUCOSE 131 (H) 04/29/2019   BUN 27 (H) 04/29/2019   CREATININE 1.30 (H) 04/29/2019   CALCIUM 8.0 (L) 04/29/2019   MG 2.1 06/12/2018   Lab Results  Component Value Date   INR 1.2 01/03/2019   Lab Results  Component Value Date   CHOL 167 08/09/2018   HDL 61 08/09/2018   LDLCALC 74 08/09/2018   TRIG 161 (H) 08/09/2018     GEN- The patient is well appearing, alert and oriented x 3 today.   Head- normocephalic, atraumatic Eyes-  Sclera clear, conjunctiva pink Ears- hearing intact Oropharynx- clear Neck- supple, no JVP Lymph- no cervical lymphadenopathy Lungs- Clear to ausculation bilaterally, normal work of breathing Heart- Regular rate and rhythm, no murmurs, rubs or gallops, PMI not laterally displaced GI- soft, NT, ND, + BS Extremities- no clubbing, cyanosis, or edema MS- no significant deformity or atrophy Skin- no rash or lesion Psych- euthymic mood, full affect  Neuro- strength and sensation are intact  EKG- NSR at 63 bpm, PR int 155 ms, qrs int 88 ms, qtc 444 ms Epic records reviewed    Assessment and Plan: 1. H/o of typical a flutter and afib with RVR  Successful cardioversion for afib with RVR 06/06/18 but ERAF Pt was admitted 06/11/18 for return of afib with RVR and she was loaded on amiodarone drip and spontaneously converted   Continue metoprolol succinate 25 mg qd Continue with amiodarone 200 mg qd  Cmet recently checked and pt will try to locate TSH, she  feels was  checked with pcp in the last few months  2. CHA2DS2VASc score of 4 Off xareleto for GI bleed in August requiring 4 units of blood Per pt she was not   to restart  Recheck in 6 months   Butch Penny C. [place], [facility] [address] [place], [place] [address] [phone]

## 2019-05-26 NOTE — Progress Notes (Signed)
Patient Care Team: [name], MD as PCP - General (Family Medicine)  DIAGNOSIS:    ICD-10-CM   1. 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 BRCA reportedly normal.   08/25/2015 Relapse/Recurrence   new solitary liver lesion, biopsy showing metastatic ER PR + breast   09/25/2015 - 03/11/2016 Chemotherapy   Xeloda. Stopped due to progression of liver and bone metastases; pleural metastases   03/31/2016 - 07/19/2018 Anti-estrogen oral therapy   Foslodex started 03/31/16, currently monthly  Ibrance 164m 3 weeks on, 1 week off started 03/31/16 Xgeva started on 05/05/16 Letrozole daily added 08/01/17    03/20/2018 Genetic Testing   Genetic testing performed through [facility]'s Multi-Cancer reported out on 03/19/2018 showed no pathogenic mutations. The Multi-Cancer Panel offered by [facility] includes sequencing and/or deletion duplication testing of the following 91 genes: AIP, ALK, APC, ATM, AXIN2, BAP1, BARD1, BLM, BMPR1A, BRCA1, BRCA2, BRIP1, BUB1B, CASR, CDC73, CDH1, CDK4, CDKN1B, CDKN1C, CDKN2A, CEBPA, CEP57, CHEK2, CTNNA1, DICER1, DIS3L2, EGFR, ENG, EPCAM, FH, FLCN, GALNT12, GATA2, GPC3, GREM1, HOXB13, HRAS, KIT, MAX, MEN1, MET, MITF, MLH1, MLH3, MSH2, MSH3, MSH6, MUTYH, NBN, NF1, NF2, NTHL1, PALB2,  PDGFRA, PHOX2B, PMS2, POLD1, POLE, POT1, PRKAR1A, PTCH1, PTEN, RAD50, RAD51C, RAD51D, RB1, RECQL4, RET, RNF43, RPS20, RUNX1, SDHA, SDHAF2, SDHB, SDHC, SDHD, SMAD4, SMARCA4, SMARCB1, SMARCE1, STK11, SUFU, TERC, TERT, TMEM127, TP53, TSC1, TSC2, VHL, WRN, WT1  A variant of uncertain significance (VUS) in a gene called MET was also noted. c.[id number]>G (p.Thr557Ala)   04/08/2018 Miscellaneous   [facility] molecular testing: Androgen receptor positive, PDL 1 neg, MSI stable, NTRK1/2/3Neg, T MBB intermediate 9 mutations/Mb, ESR 1-, PI 3 CA negative, BRCA negative   07/20/2018 - 08/30/2018 Anti-estrogen oral therapy   Exemestane with everolimus.  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   [place]  03/27/2016 Initial Diagnosis   [place]   08/03/2017 - 01/31/2018 Chemotherapy   Foslodex started 03/31/16 monthly  Ibrance 1270m3 weeks on, 1 week off started 03/31/16 Daily letrozole added 08/01/17    Breast cancer metastas[place] to liver, unspecified laterality [place]  03/27/2016 Initial Diagnosis   Breast cancer metastas[place] to liver, [place] laterality [place]    CHIEF COMPLIANT: Follow-up of metastatic breast cancer onenzalutamide  INTERVAL HISTORY: Brittany MOSELEYs a [age].o. with above-mentioned history of metastatic breast cancerwith metastases to thebone,liver,and lungswhois currently on treatment withenzalutamide andXgeva.She presents to the clinic today for treatment.  She has intermittent diarrhea from enzalutamide.  She takes occasional Imodium which appears to be stopping the diarrhea.  ALLERGIES:  is allergic to prednisolone; prednisone; and sulfa antibiotics.  MEDICATIONS:  Current  Outpatient Medications  Medication Sig Dispense Refill  . ACCU-CHEK AVIVA PLUS test strip     . Accu-Chek FastClix Lancets MISC     . amiodarone (PACERONE) 200 MG tablet Take 1 tablet (200 mg total) by mouth daily. 180 tablet 0  . atorvastatin (LIPITOR) 20 MG tablet Take 1 tablet (20 mg total) by mouth daily at 6 PM. 45 tablet 9  . Calcium Carb-Cholecalciferol (CALCIUM 600+D3) 600-800 MG-UNIT TABS Take 1 tablet by mouth 2 (two) times daily.     . Cholecalciferol (VITAMIN D3) 2000 UNITS TABS Take 2,000 mg by mouth daily.    . enzalutamide (XTANDI) 40 MG capsule Take 4 capsules (160 mg total) by mouth daily. 120 capsule 3  . ferrous sulfate 325 (65 FE) MG tablet Take 325 mg by mouth daily as needed (energy).     . fish oil-omega-3 fatty acids 1000 MG capsule Take 1 g by mouth 2 (two) times daily.     . furosemide (LASIX) 20 MG tablet Take 1 tablet (20 mg total) by mouth daily. Reported on 05/07/2015 30 tablet 0  . gabapentin (NEURONTIN) 100 MG capsule Take 100 mg by mouth 2 (two) times daily.    . Ipratropium-Albuterol (COMBIVENT RESPIMAT) 20-100 MCG/ACT AERS respimat Inhale 1 puff into the lungs every 6 (six) hours. (Patient taking differently: Inhale 1 puff into the lungs every 6 (six) hours as needed for wheezing or shortness of breath. ) 4 g 6  . Magnesium Oxide (MAG-OXIDE) 200 MG TABS Take 1 tablet (200 mg total) by mouth daily. (Patient taking differently: Take 200 mg by mouth at bedtime. ) 30 tablet 0  . metFORMIN (GLUCOPHAGE) 500 MG tablet Take 2 tablets (1,000 mg total) by mouth 2 (two) times daily with a meal. 120 tablet 0  . metoprolol succinate (TOPROL-XL) 25 MG 24 hr tablet Take 1 tablet (25 mg total) by mouth daily. 90 tablet 3  . pantoprazole (PROTONIX) 40 MG tablet Take 1 tablet (40 mg total) by mouth 2 (two) times daily before a meal. 60 tablet 6  . sodium chloride (OCEAN) 0.65 % SOLN nasal spray Place 1 spray into both nostrils as needed for congestion.     No current  facility-administered medications for this visit.    PHYSICAL EXAMINATION: ECOG PERFORMANCE STATUS: 1 - Symptomatic but completely ambulatory  Vitals:   05/27/19 1131  BP: (!) 146/94  Pulse: 67  Resp: 17  Temp: 98.7 F (37.1 C)  SpO2: 100%   Filed Weights   05/27/19 1131  Weight: 170 lb 3.2 oz (77.2 kg)    LABORATORY DATA:  I have reviewed the data as listed CMP Latest Ref Rng & Units 05/27/2019 04/29/2019 04/04/2019  Glucose 70 - 99 mg/dL 195(H) 131(H) 162(H)  BUN 8 - 23 mg/dL 28(H) 27(H) 25(H)  Creatinine 0.44 - 1.00 mg/dL 1.32(H) 1.30(H) 1.35(H)  Sodium 135 - 145 mmol/L 141 142 142  Potassium 3.5 - 5.1 mmol/L 5.0 4.8 4.8  Chloride 98 - 111 mmol/L 101 103 101  CO2 22 - 32 mmol/L '28 28 28  ' Calcium 8.9 - 10.3 mg/dL 8.2(L) 8.0(L) 7.9(L)  Total Protein 6.5 - 8.1 g/dL 6.9 6.7 6.9  Total Bilirubin 0.3 - 1.2 mg/dL 0.6 0.6 0.9  Alkaline Phos 38 - 126 U/L 67 52 55  AST 15 - 41 U/L '22 20 22  ' ALT 0 - 44 U/L '8 7 9    ' Lab  Results  Component Value Date   WBC 6.3 05/27/2019   HGB 10.2 (L) 05/27/2019   HCT 32.4 (L) 05/27/2019   MCV 98.2 05/27/2019   PLT 227 05/27/2019   NEUTROABS 4.1 05/27/2019    ASSESSMENT & PLAN:  Breast cancer metastasized to liver [facility]) Metastatic breast cancer: recently progressive in liver and bone, also pleura.  Faslodex + Ibrance beginning 03/31/2016-March 2020 stopped due to progression Exemestane with everolimus discontinued 08/29/2018 due to hyperglycemia  [facility] molecular testing: Androgen receptor positive, PDL 1 neg, MSI stable, NTRK1/2/3Neg, T MBB intermediate 9 mutations/Mb, ESR 1-, PI 3 CA negative, BRCA negative (Xeloda discontinued 04/08/2019)  Hospitalization for severe anemia hemoglobin of 2.6 received 4 units of PRBC  CT CAP: 04/04/2019: Worsening liver metastatic disease with increase in number and size of the liver masses. Left lobe: 5.6 cm previously was 2.6 cm. Right lobe 4.1 cm previously 4 cm, 2.8 cm was 0.8 cm. Stable  bone mets  Current treatment: Enzalutamide given the androgen receptor positivity started 04/15/2019 Enzalutamide toxicities:  Diarrhea: Instructed to take Imodium She has good appetite and feels generally well.  Return to clinic in6weeksfor follow-up with labs and scans done prior to the visit    No orders of the defined types were placed in this encounter.  The patient has a good understanding of the overall plan. she agrees with it. she will call with any problems that may develop before the next visit here.  Total time spent: 30 mins including face to face time and time spent for planning, charting and coordination of care  Nicholas Lose, MD 05/27/2019  I, Cloyde Reams Dorshimer, am acting as scribe for Dr. Nicholas Lose.  I have reviewed the above documentation for accuracy and completeness, and I agree with the above.

## 2019-05-27 ENCOUNTER — Other Ambulatory Visit: Payer: Self-pay

## 2019-05-27 ENCOUNTER — Inpatient Hospital Stay (HOSPITAL_BASED_OUTPATIENT_CLINIC_OR_DEPARTMENT_OTHER): Payer: Medicare Other | Admitting: Hematology and Oncology

## 2019-05-27 ENCOUNTER — Inpatient Hospital Stay: Payer: Medicare Other | Attending: Hematology and Oncology

## 2019-05-27 ENCOUNTER — Telehealth: Payer: Self-pay | Admitting: Hematology and Oncology

## 2019-05-27 VITALS — BP 146/94 | HR 67 | Temp 98.7°F | Resp 17 | Ht 63.0 in | Wt 170.2 lb

## 2019-05-27 DIAGNOSIS — C7951 Secondary malignant neoplasm of bone: Secondary | ICD-10-CM | POA: Diagnosis not present

## 2019-05-27 DIAGNOSIS — Z923 Personal history of irradiation: Secondary | ICD-10-CM | POA: Insufficient documentation

## 2019-05-27 DIAGNOSIS — Z9013 Acquired absence of bilateral breasts and nipples: Secondary | ICD-10-CM | POA: Insufficient documentation

## 2019-05-27 DIAGNOSIS — R197 Diarrhea, unspecified: Secondary | ICD-10-CM | POA: Diagnosis not present

## 2019-05-27 DIAGNOSIS — C782 Secondary malignant neoplasm of pleura: Secondary | ICD-10-CM

## 2019-05-27 DIAGNOSIS — Z7984 Long term (current) use of oral hypoglycemic drugs: Secondary | ICD-10-CM | POA: Insufficient documentation

## 2019-05-27 DIAGNOSIS — C787 Secondary malignant neoplasm of liver and intrahepatic bile duct: Secondary | ICD-10-CM | POA: Diagnosis not present

## 2019-05-27 DIAGNOSIS — Z79899 Other long term (current) drug therapy: Secondary | ICD-10-CM | POA: Insufficient documentation

## 2019-05-27 DIAGNOSIS — C50911 Malignant neoplasm of unspecified site of right female breast: Secondary | ICD-10-CM

## 2019-05-27 DIAGNOSIS — C50919 Malignant neoplasm of unspecified site of unspecified female breast: Secondary | ICD-10-CM

## 2019-05-27 DIAGNOSIS — Z9221 Personal history of antineoplastic chemotherapy: Secondary | ICD-10-CM | POA: Insufficient documentation

## 2019-05-27 LAB — CBC WITH DIFFERENTIAL (CANCER CENTER ONLY)
Abs Immature Granulocytes: 0.01 10*3/uL (ref 0.00–0.07)
Basophils Absolute: 0 10*3/uL (ref 0.0–0.1)
Basophils Relative: 1 %
Eosinophils Absolute: 0.1 10*3/uL (ref 0.0–0.5)
Eosinophils Relative: 1 %
HCT: 32.4 % — ABNORMAL LOW (ref 36.0–46.0)
Hemoglobin: 10.2 g/dL — ABNORMAL LOW (ref 12.0–15.0)
Immature Granulocytes: 0 %
Lymphocytes Relative: 24 %
Lymphs Abs: 1.5 10*3/uL (ref 0.7–4.0)
MCH: 30.9 pg (ref 26.0–34.0)
MCHC: 31.5 g/dL (ref 30.0–36.0)
MCV: 98.2 fL (ref 80.0–100.0)
Monocytes Absolute: 0.6 10*3/uL (ref 0.1–1.0)
Monocytes Relative: 10 %
Neutro Abs: 4.1 10*3/uL (ref 1.7–7.7)
Neutrophils Relative %: 64 %
Platelet Count: 227 10*3/uL (ref 150–400)
RBC: 3.3 MIL/uL — ABNORMAL LOW (ref 3.87–5.11)
RDW: 18.6 % — ABNORMAL HIGH (ref 11.5–15.5)
WBC Count: 6.3 10*3/uL (ref 4.0–10.5)
nRBC: 0 % (ref 0.0–0.2)

## 2019-05-27 LAB — CMP (CANCER CENTER ONLY)
ALT: 8 U/L (ref 0–44)
AST: 22 U/L (ref 15–41)
Albumin: 3.3 g/dL — ABNORMAL LOW (ref 3.5–5.0)
Alkaline Phosphatase: 67 U/L (ref 38–126)
Anion gap: 12 (ref 5–15)
BUN: 28 mg/dL — ABNORMAL HIGH (ref 8–23)
CO2: 28 mmol/L (ref 22–32)
Calcium: 8.2 mg/dL — ABNORMAL LOW (ref 8.9–10.3)
Chloride: 101 mmol/L (ref 98–111)
Creatinine: 1.32 mg/dL — ABNORMAL HIGH (ref 0.44–1.00)
GFR, Est AFR Am: 46 mL/min — ABNORMAL LOW (ref >60)
GFR, Estimated: 39 mL/min — ABNORMAL LOW (ref >60)
Glucose, Bld: 195 mg/dL — ABNORMAL HIGH (ref 70–99)
Potassium: 5 mmol/L (ref 3.5–5.1)
Sodium: 141 mmol/L (ref 135–145)
Total Bilirubin: 0.6 mg/dL (ref 0.3–1.2)
Total Protein: 6.9 g/dL (ref 6.5–8.1)

## 2019-05-27 NOTE — Assessment & Plan Note (Signed)
Metastatic breast cancer: recently progressive in liver and bone, also pleura.  Faslodex + Ibrance beginning 03/31/2016-March 2020 stopped due to progression Exemestane with everolimus discontinued 08/29/2018 due to hyperglycemia  [facility] molecular testing: Androgen receptor positive, PDL 1 neg, MSI stable, NTRK1/2/3Neg, T MBB intermediate 9 mutations/Mb, ESR 1-, PI 3 CA negative, BRCA negative (Xeloda discontinued 04/08/2019)  Hospitalization for severe anemia hemoglobin of 2.6 received 4 units of PRBC  CT CAP: 04/04/2019: Worsening liver metastatic disease with increase in number and size of the liver masses. Left lobe: 5.6 cm previously was 2.6 cm. Right lobe 4.1 cm previously 4 cm, 2.8 cm was 0.8 cm. Stable bone mets  Current treatment: Enzalutamide given the androgen receptor positivity started 04/15/2019 Enzalutamide toxicities: Denies any adverse effects to treatment.  Return to clinic in4weeksfor follow-up with labs

## 2019-05-27 NOTE — Telephone Encounter (Signed)
I talk with patient regarding schedule  

## 2019-06-05 ENCOUNTER — Ambulatory Visit: Payer: Medicare Other | Admitting: Internal Medicine

## 2019-06-05 ENCOUNTER — Encounter: Payer: Self-pay | Admitting: Internal Medicine

## 2019-06-05 ENCOUNTER — Other Ambulatory Visit: Payer: Self-pay

## 2019-06-05 VITALS — BP 152/70 | HR 70 | Temp 96.9°F | Ht 63.0 in | Wt 170.2 lb

## 2019-06-05 DIAGNOSIS — C7951 Secondary malignant neoplasm of bone: Secondary | ICD-10-CM

## 2019-06-05 DIAGNOSIS — I2510 Atherosclerotic heart disease of native coronary artery without angina pectoris: Secondary | ICD-10-CM

## 2019-06-05 DIAGNOSIS — C50919 Malignant neoplasm of unspecified site of unspecified female breast: Secondary | ICD-10-CM | POA: Diagnosis not present

## 2019-06-05 DIAGNOSIS — I480 Paroxysmal atrial fibrillation: Secondary | ICD-10-CM | POA: Diagnosis not present

## 2019-06-05 DIAGNOSIS — I10 Essential (primary) hypertension: Secondary | ICD-10-CM

## 2019-06-05 NOTE — Progress Notes (Signed)
Cardiology Office Note:    Date:  06/05/2019   ID:  Brittany Porter, DOB 1945-06-06, MRN [id number]  PCP:  [name], MD  Cardiologist:  No primary care provider on file.  Electrophysiologist:  None   Referring MD: [name], MD   Chief Complaint: cardiovascular follow up  History of Present Illness:    Brittany Porter is a 74 y.o. female with a hx of well-known to my clinic.  Most recently seen by my colleague Bunnie Domino, NP as well as Roderic Palau, NP in the atrial fibrillation clinic.  She has had recent hospitalization for generalized weakness, palpitations, and severe symptomatic anemia with a hemoglobin of 2.7.  Was not frankly found to have GI bleeding however received 4 units of packed red blood cells and was also found to have pneumonia.  Xarelto was discontinued at that time.  History also includes hypertension, diabetes, bilateral breast cancer status post bilateral mastectomy with bone, liver, lung mets on therapy with oncology following closely.  She is overall well today and has recovered from her hospitalization.  Her primary concern today is bilateral shin itching with visible excoriations.  She denies chest pain, limiting shortness of breath, palpitations, lower extremity swelling, PND, orthopnea.  Past Medical History:  Diagnosis Date  . Arthritis   . Breast cancer ([place])   . Diabetes mellitus   . Endometrial carcinoma ([place]) 10/2008  . Family history of breast cancer   . Family history of colon cancer   . Family history of skin cancer   . Fluid overload   . History of radiation therapy 04/13/2009, 04/16/2009, 04/27/2009, 05/07/2009, 05/18/2009   3000 cGy to proximal vagina  . Hyperlipidemia   . Iron deficiency   . Melanoma ([place]) 2010   rt arm and back  . Ovarian cancer ([place])   . Pleural effusion 04/2010    Past Surgical History:  Procedure Laterality Date  . ABDOMINAL HYSTERECTOMY  10/2008  . APPENDECTOMY    . ESOPHAGOGASTRODUODENOSCOPY (EGD) WITH  PROPOFOL N/A 12/30/2018   Procedure: ESOPHAGOGASTRODUODENOSCOPY (EGD) WITH PROPOFOL;  Surgeon: Ronald Lobo, MD;  Location: [facility];  Service: Endoscopy;  Laterality: N/A;  . INSERTION OF MESH  05/28/2012   Procedure: INSERTION OF MESH;  Surgeon: Adin Hector, MD;  Location: [facility];  Service: General;  Laterality: N/A;  . KNEE ARTHROSCOPY  04/2010   Right knee  . LAPAROSCOPIC LYSIS OF ADHESIONS  05/28/2012   Procedure: LAPAROSCOPIC LYSIS OF ADHESIONS;  Surgeon: Adin Hector, MD;  Location: [facility];  Service: General;  Laterality: N/A;  . MASTECTOMY  1997   Bilateral with lymph nodes  . Melanoma removal  02/2009, 07/2009  . VENTRAL HERNIA REPAIR  05/28/2012   Procedure: LAPAROSCOPIC VENTRAL HERNIA;  Surgeon: Adin Hector, MD;  Location: [facility];  Service: General;  Laterality: N/A;    Current Medications: No outpatient medications have been marked as taking for the 06/05/19 encounter (Office Visit) with Elouise Munroe, MD.     Allergies:   Prednisolone, Prednisone, and Sulfa antibiotics   Social History   Socioeconomic History  . Marital status: Married    Spouse name: Not on file  . Number of children: Not on file  . Years of education: Not on file  . Highest education level: Not on file  Occupational History  . Not on file  Tobacco Use  . Smoking status: Never Smoker  . Smokeless tobacco: Never Used  Substance and Sexual Activity  . Alcohol use:  No  . Drug use: No  . Sexual activity: Yes  Other Topics Concern  . Not on file  Social History Narrative  . Not on file   Social Determinants of Health   Financial Resource Strain:   . Difficulty of Paying Living Expenses: Not on file  Food Insecurity:   . Worried About Charity fundraiser in the Last Year: Not on file  . Ran Out of Food in the Last Year: Not on file  Transportation Needs:   . Lack of Transportation (Medical): Not on file  . Lack of Transportation (Non-Medical): Not on file  Physical  Activity:   . Days of Exercise per Week: Not on file  . Minutes of Exercise per Session: Not on file  Stress:   . Feeling of Stress : Not on file  Social Connections:   . Frequency of Communication with Friends and Family: Not on file  . Frequency of Social Gatherings with Friends and Family: Not on file  . Attends Religious Services: Not on file  . Active Member of Clubs or Organizations: Not on file  . Attends Archivist Meetings: Not on file  . Marital Status: Not on file     Family History: The patient's family history includes ALS in her sister; Breast cancer in her sister; Cancer in her father and sister; Colon cancer in her sister; Colon cancer (age of onset: 51) in her sister; Dementia in her sister; Diabetes in her brother; Heart attack in her brother; Skin cancer (age of onset: 3) in her brother; Stroke in her brother, mother, and sister.  ROS:   Please see the history of present illness.    All other systems reviewed and are negative.  EKGs/Labs/Other Studies Reviewed:    The following studies were reviewed today:  EKG: Sinus rhythm rate 63  Recent Labs: 06/12/2018: Magnesium 2.1 08/24/2018: B Natriuretic Peptide 76.7 11/05/2018: TSH 2.122 05/27/2019: ALT 8; BUN 28; Creatinine 1.32; Hemoglobin 10.2; Platelet Count 227; Potassium 5.0; Sodium 141  Recent Lipid Panel    Component Value Date/Time   CHOL 167 08/09/2018 1114   TRIG 161 (H) 08/09/2018 1114   HDL 61 08/09/2018 1114   CHOLHDL 2.7 08/09/2018 1114   VLDL 32 08/09/2018 1114   LDLCALC 74 08/09/2018 1114    Physical Exam:    VS:  BP (!) 152/70   Pulse 70   Temp (!) 96.9 F (36.1 C)   Ht 5\' 3"  (1.6 m)   Wt 170 lb 3.2 oz (77.2 kg)   SpO2 96%   BMI 30.15 kg/m     Wt Readings from Last 5 Encounters:  06/05/19 170 lb 3.2 oz (77.2 kg)  05/27/19 170 lb 3.2 oz (77.2 kg)  05/16/19 174 lb (78.9 kg)  05/06/19 172 lb (78 kg)  04/29/19 171 lb 12.8 oz (77.9 kg)     Constitutional: No acute  distress Cardiovascular: regular rhythm, normal rate, no murmurs. S1 and S2 normal. No jugular venous distention.  Respiratory: clear to auscultation bilaterally GI : normal bowel sounds, soft and nontender. No distention.   MSK: extremities warm, well perfused. No edema.  NEURO: grossly nonfocal exam, moves all extremities. PSYCH: alert and oriented x 3, normal mood and affect.  SKIN: Excoriations over the bilateral shins  ASSESSMENT:    1. Paroxysmal atrial fibrillation (HCC)   2. Essential hypertension   3. Coronary artery disease involving native coronary artery of native heart without angina pectoris   4. Carcinoma of breast  metastatic to bone, unspecified laterality ([place])    PLAN:    PAF-no palpitations.  Continues on amiodarone 200 mg daily and metoprolol succinate 25 mg daily.  Off of Xarelto due to severe symptomatic anemia.  Hypertension-blood pressure is mildly elevated today on review of serial blood pressure recordings, it has been mildly elevated.  She is on metoprolol succinate 25 mg daily.  Blood pressure on serial review of previous 1 year recordings notes some lower blood pressures as well.  Suggest lability of blood pressure.  No change to therapy at this time however if blood pressure continues to remain elevated can discuss addition of low-dose amlodipine for alternate therapy.  Previously reduced ejection fraction now normalized-no evidence of heart failure today.  Continue on Lasix.  Coronary artery disease - continue lipitor and metoprolol. No symptoms.   Total time of encounter: 30 minutes total time of encounter, including 20 minutes spent in face-to-face patient care. This time includes coordination of care and counseling regarding CAD, PAF, HTN. Remainder of non-face-to-face time involved reviewing chart documents/testing relevant to the patient encounter and documentation in the medical record.  Cherlynn Kaiser, MD [place]  [facility]   Medication  Adjustments/Labs and Tests Ordered: Current medicines are reviewed at length with the patient today.  Concerns regarding medicines are outlined above.  No orders of the defined types were placed in this encounter.  No orders of the defined types were placed in this encounter.   Patient Instructions  Medication Instructions:  No changes *If you need a refill on your cardiac medications before your next appointment, please call your pharmacy*  Lab Work: None ordered If you have labs (blood work) drawn today and your tests are completely normal, you will receive your results only by: Marland Kitchen MyChart Message (if you have MyChart) OR . A paper copy in the mail If you have any lab test that is abnormal or we need to change your treatment, we will call you to review the results.  Testing/Procedures: None ordered  Follow-Up: At [facility], you and your health needs are our priority.  As part of our continuing mission to provide you with exceptional heart care, we have created designated Provider Care Teams.  These Care Teams include your primary Cardiologist (physician) and Advanced Practice Providers (APPs -  Physician Assistants and Nurse Practitioners) who all work together to provide you with the care you need, when you need it.  Your next appointment:   6 month(s)  The format for your next appointment:   In Person  Provider:   Cherlynn Kaiser, MD

## 2019-06-05 NOTE — Patient Instructions (Signed)
Medication Instructions:  No changes *If you need a refill on your cardiac medications before your next appointment, please call your pharmacy*  Lab Work: None ordered If you have labs (blood work) drawn today and your tests are completely normal, you will receive your results only by: Marland Kitchen MyChart Message (if you have MyChart) OR . A paper copy in the mail If you have any lab test that is abnormal or we need to change your treatment, we will call you to review the results.  Testing/Procedures: None ordered  Follow-Up: At [facility], you and your health needs are our priority.  As part of our continuing mission to provide you with exceptional heart care, we have created designated Provider Care Teams.  These Care Teams include your primary Cardiologist (physician) and Advanced Practice Providers (APPs -  Physician Assistants and Nurse Practitioners) who all work together to provide you with the care you need, when you need it.  Your next appointment:   6 month(s)  The format for your next appointment:   In Person  Provider:   Cherlynn Kaiser, MD

## 2019-06-07 ENCOUNTER — Telehealth: Payer: Self-pay | Admitting: *Deleted

## 2019-06-07 NOTE — Telephone Encounter (Signed)
Received VM from pt, attempt x1 to contact pt, no answer, LVM to return call to the office.

## 2019-06-12 ENCOUNTER — Other Ambulatory Visit: Payer: Self-pay | Admitting: Internal Medicine

## 2019-06-28 ENCOUNTER — Telehealth: Payer: Self-pay | Admitting: Internal Medicine

## 2019-06-28 MED ORDER — AMIODARONE HCL 200 MG PO TABS: 200.0000 mg | ORAL_TABLET | Freq: Every day | ORAL | 3 refills | Status: AC

## 2019-06-28 NOTE — Telephone Encounter (Signed)
New message   Patient needs a new prescription for amiodarone (PACERONE) 200 MG tablet sent to [facility], [place]  90 day supply

## 2019-06-28 NOTE — Telephone Encounter (Signed)
rx sent to pharmacy

## 2019-07-01 ENCOUNTER — Encounter (HOSPITAL_COMMUNITY): Payer: Self-pay

## 2019-07-01 ENCOUNTER — Ambulatory Visit (HOSPITAL_COMMUNITY)
Admission: RE | Admit: 2019-07-01 | Discharge: 2019-07-01 | Disposition: A | Discharge status: Home / Self Care | Payer: Medicare Other | Source: Ambulatory Visit | Attending: Hematology and Oncology | Admitting: Hematology and Oncology

## 2019-07-01 ENCOUNTER — Other Ambulatory Visit: Payer: Self-pay

## 2019-07-01 DIAGNOSIS — C787 Secondary malignant neoplasm of liver and intrahepatic bile duct: Secondary | ICD-10-CM | POA: Diagnosis present

## 2019-07-01 DIAGNOSIS — C50911 Malignant neoplasm of unspecified site of right female breast: Secondary | ICD-10-CM | POA: Insufficient documentation

## 2019-07-01 DIAGNOSIS — C782 Secondary malignant neoplasm of pleura: Secondary | ICD-10-CM | POA: Insufficient documentation

## 2019-07-01 DIAGNOSIS — C50919 Malignant neoplasm of unspecified site of unspecified female breast: Secondary | ICD-10-CM | POA: Insufficient documentation

## 2019-07-01 MED ORDER — IOHEXOL 300 MG/ML  SOLN
100.0000 mL | Freq: Once | INTRAMUSCULAR | Status: AC | PRN
  Administered 2019-07-01: 10:00:00 100 mL via INTRAVENOUS

## 2019-07-01 MED ORDER — SODIUM CHLORIDE (PF) 0.9 % IJ SOLN
INTRAMUSCULAR | Status: AC
  Filled 2019-07-01: qty 50

## 2019-07-03 ENCOUNTER — Telehealth: Payer: Self-pay | Admitting: *Deleted

## 2019-07-03 NOTE — Telephone Encounter (Signed)
error 

## 2019-07-05 ENCOUNTER — Other Ambulatory Visit: Payer: Self-pay | Admitting: *Deleted

## 2019-07-05 DIAGNOSIS — C50911 Malignant neoplasm of unspecified site of right female breast: Secondary | ICD-10-CM

## 2019-07-05 DIAGNOSIS — C787 Secondary malignant neoplasm of liver and intrahepatic bile duct: Secondary | ICD-10-CM

## 2019-07-07 NOTE — Progress Notes (Signed)
Patient Care Team: [name], MD as PCP - General (Family Medicine)  DIAGNOSIS:    ICD-10-CM   1. 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 BRCA reportedly normal.   08/25/2015 Relapse/Recurrence   new solitary liver lesion, biopsy showing metastatic ER PR + breast   09/25/2015 - 03/11/2016 Chemotherapy   Xeloda. Stopped due to progression of liver and bone metastases; pleural metastases   03/31/2016 - 07/19/2018 Anti-estrogen oral therapy   Foslodex started 03/31/16, currently monthly  Ibrance 168m 3 weeks on, 1 week off started 03/31/16 Xgeva started on 05/05/16 Letrozole daily added 08/01/17    03/20/2018 Genetic Testing   Genetic testing performed through [facility]'s Multi-Cancer reported out on 03/19/2018 showed no pathogenic mutations. The Multi-Cancer Panel offered by [facility] includes sequencing and/or deletion duplication testing of the following 91 genes: AIP, ALK, APC, ATM, AXIN2, BAP1, BARD1, BLM, BMPR1A, BRCA1, BRCA2, BRIP1, BUB1B, CASR, CDC73, CDH1, CDK4, CDKN1B, CDKN1C, CDKN2A, CEBPA, CEP57, CHEK2, CTNNA1, DICER1, DIS3L2, EGFR, ENG, EPCAM, FH, FLCN, GALNT12, GATA2, GPC3, GREM1, HOXB13, HRAS, KIT, MAX, MEN1, MET, MITF, MLH1, MLH3, MSH2, MSH3, MSH6, MUTYH, NBN, NF1, NF2, NTHL1, PALB2, PDGFRA, PHOX2B, PMS2, POLD1, POLE, POT1, PRKAR1A, PTCH1, PTEN, RAD50, RAD51C, RAD51D, RB1, RECQL4,  RET, RNF43, RPS20, RUNX1, SDHA, SDHAF2, SDHB, SDHC, SDHD, SMAD4, SMARCA4, SMARCB1, SMARCE1, STK11, SUFU, TERC, TERT, TMEM127, TP53, TSC1, TSC2, VHL, WRN, WT1  A variant of uncertain significance (VUS) in a gene called MET was also noted. c.[id number]>G (p.Thr557Ala)   04/08/2018 Miscellaneous   [facility] molecular testing: Androgen receptor positive, PDL 1 neg, MSI stable, NTRK1/2/3Neg, T MBB intermediate 9 mutations/Mb, ESR 1-, PI 3 CA negative, BRCA negative   07/20/2018 - 08/30/2018 Anti-estrogen oral therapy   Exemestane with everolimus.  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 with above-mentioned history of metastatic breast cancerwith metastases to thebone,liver,and lungswhois currently on treatment withenzalutamideandXgeva.CT abdomen/pelvis on 07/01/19 showed progressive pleuroparenchymal, hepatic, peritoneal and osseous metastatic disease. She presentsto the clinic today to review her scan.  ALLERGIES:  is allergic to prednisolone; prednisone; and sulfa antibiotics.  MEDICATIONS:  Current Outpatient Medications  Medication Sig Dispense Refill   ACCU-CHEK  AVIVA PLUS test strip      Accu-Chek  FastClix Lancets MISC      amiodarone (PACERONE) 200 MG tablet Take 1 tablet (200 mg total) by mouth daily. 90 tablet 3   atorvastatin (LIPITOR) 20 MG tablet Take 1 tablet (20 mg total) by mouth daily at 6 PM. 45 tablet 9   Calcium Carb-Cholecalciferol (CALCIUM 600+D3) 600-800 MG-UNIT TABS Take 1 tablet by mouth 2 (two) times daily.      Cholecalciferol (VITAMIN D3) 2000 UNITS TABS Take 2,000 mg by mouth daily.     enzalutamide (XTANDI) 40 MG capsule Take 4 capsules (160 mg total) by mouth daily. 120 capsule 3   ferrous sulfate 325 (65 FE) MG tablet Take 325 mg by mouth daily as needed (energy).      fish oil-omega-3 fatty acids 1000 MG capsule Take 1 g by mouth 2 (two) times daily.      furosemide (LASIX) 20 MG tablet Take 1 tablet (20 mg total) by mouth daily. Reported on 05/07/2015 30 tablet 0   gabapentin (NEURONTIN) 100 MG capsule Take 100 mg by mouth 2 (two) times daily.     Ipratropium-Albuterol (COMBIVENT RESPIMAT) 20-100 MCG/ACT AERS respimat Inhale 1 puff into the lungs every 6 (six) hours. (Patient taking differently: Inhale 1 puff into the lungs every 6 (six) hours as needed for wheezing or shortness of breath. ) 4 g 6   Magnesium Oxide (MAG-OXIDE) 200 MG TABS Take 1 tablet (200 mg total) by mouth daily. (Patient taking differently: Take 200 mg by mouth at bedtime. ) 30 tablet 0   metFORMIN (GLUCOPHAGE) 500 MG tablet Take 2 tablets (1,000 mg total) by mouth 2 (two) times daily with a meal. 120 tablet 0   metoprolol succinate (TOPROL-XL) 25 MG 24 hr tablet Take 1 tablet (25 mg total) by mouth daily. 90 tablet 3   pantoprazole (PROTONIX) 40 MG tablet Take 1 tablet (40 mg total) by mouth 2 (two) times daily before a meal. 60 tablet 6   sodium chloride (OCEAN) 0.65 % SOLN nasal spray Place 1 spray into both nostrils as needed for congestion.     No current facility-administered medications for this visit.    PHYSICAL EXAMINATION: ECOG PERFORMANCE STATUS: 1 - Symptomatic  but completely ambulatory  There were no vitals filed for this visit. There were no vitals filed for this visit.  LABORATORY DATA:  I have reviewed the data as listed CMP Latest Ref Rng & Units 05/27/2019 04/29/2019 04/04/2019  Glucose 70 - 99 mg/dL 195(H) 131(H) 162(H)  BUN 8 - 23 mg/dL 28(H) 27(H) 25(H)  Creatinine 0.44 - 1.00 mg/dL 1.32(H) 1.30(H) 1.35(H)  Sodium 135 - 145 mmol/L 141 142 142  Potassium 3.5 - 5.1 mmol/L 5.0 4.8 4.8  Chloride 98 - 111 mmol/L 101 103 101  CO2 22 - 32 mmol/L '28 28 28  ' Calcium 8.9 - 10.3 mg/dL 8.2(L) 8.0(L) 7.9(L)  Total Protein 6.5 - 8.1 g/dL 6.9 6.7 6.9  Total Bilirubin 0.3 - 1.2 mg/dL 0.6 0.6 0.9  Alkaline Phos 38 - 126 U/L 67 52 55  AST 15 - 41 U/L '22 20 22  ' ALT 0 - 44 U/L '8 7 9    ' Lab Results  Component Value Date   WBC 6.3 05/27/2019   HGB 10.2 (L) 05/27/2019   HCT 32.4 (L) 05/27/2019   MCV 98.2 05/27/2019   PLT 227 05/27/2019   NEUTROABS 4.1 05/27/2019    ASSESSMENT & PLAN:  Breast cancer metastasized to liver ([place])  Metastatic breast cancer: recently progressive in liver and bone, also pleura.  Faslodex + Ibrance beginning 03/31/2016-March 2020 stopped due to progression Exemestane with everolimus discontinued 08/29/2018 due to hyperglycemia  [facility] molecular testing: Androgen receptor positive, PDL 1 neg, MSI stable, NTRK1/2/3Neg, T MBB intermediate 9 mutations/Mb, ESR 1-, PI 3 CA negative, BRCA negative (Xeloda discontinued 04/08/2019)  Hospitalization for severe anemia hemoglobin of 2.6 received 4 units of PRBC  CT CAP: 04/04/2019: Worsening liver metastatic disease with increase in number and size of the liver masses. Left lobe: 5.6 cm previously was 2.6 cm. Right lobe 4.1 cm previously 4 cm, 2.8 cm was 0.8 cm. Stable bone mets  Current treatment:Enzalutamide given the androgen receptor positivitystarted 04/15/2019 discontinued 07/08/2019 for progression Enzalutamide toxicities: Diarrhea: Instructed to take  Imodium She has good appetite and feels generally well.  CT abdomen pelvis 07/01/2019: Progression of pleuroparenchymal, hepatic, peritoneal and bone metastases.  Trace ascites   Goals of care counseling: I discussed with the patient that her cancer is progressing on all of the current treatments and therefore the next step would be chemotherapy.  She previously progressed on Xeloda and Halaven as well.  The likelihood of response to systemic chemotherapy is fairly low of less than 25%.  Given her age, I recommended gemcitabine as a potential treatment option.  I believe combination chemotherapy might be too harsh on her.  Alternately we can also focus on stopping all treatment and focusing on quality towards the end-of-life.  Plan: 1.  Liver biopsy to do molecular testing as well as breast prognostic panel 2.  Port placement in anticipation of chemotherapy or immunotherapy treatment  Return to clinic in 2 to 3 weeks for follow-up to discuss the pathology results and to finalize a treatment plan.   No orders of the defined types were placed in this encounter.  The patient has a good understanding of the overall plan. she agrees with it. she will call with any problems that may develop before the next visit here.  Total time spent: 30 mins including face to face time and time spent for planning, charting and coordination of care  Nicholas Lose, MD 07/08/2019  I, Cloyde Reams Dorshimer, am acting as scribe for Dr. Nicholas Lose.  I have reviewed the above documentation for accuracy and completeness, and I agree with the above.

## 2019-07-08 ENCOUNTER — Other Ambulatory Visit: Payer: Self-pay

## 2019-07-08 ENCOUNTER — Inpatient Hospital Stay: Payer: Medicare Other | Attending: Hematology and Oncology

## 2019-07-08 ENCOUNTER — Encounter (HOSPITAL_COMMUNITY): Payer: Self-pay | Admitting: Radiology

## 2019-07-08 ENCOUNTER — Other Ambulatory Visit: Payer: Self-pay | Admitting: Hematology and Oncology

## 2019-07-08 ENCOUNTER — Inpatient Hospital Stay: Payer: Medicare Other | Admitting: Hematology and Oncology

## 2019-07-08 DIAGNOSIS — C7951 Secondary malignant neoplasm of bone: Secondary | ICD-10-CM | POA: Diagnosis not present

## 2019-07-08 DIAGNOSIS — C787 Secondary malignant neoplasm of liver and intrahepatic bile duct: Secondary | ICD-10-CM

## 2019-07-08 DIAGNOSIS — Z79899 Other long term (current) drug therapy: Secondary | ICD-10-CM | POA: Insufficient documentation

## 2019-07-08 DIAGNOSIS — Z923 Personal history of irradiation: Secondary | ICD-10-CM | POA: Insufficient documentation

## 2019-07-08 DIAGNOSIS — C50911 Malignant neoplasm of unspecified site of right female breast: Secondary | ICD-10-CM

## 2019-07-08 DIAGNOSIS — Z79811 Long term (current) use of aromatase inhibitors: Secondary | ICD-10-CM | POA: Insufficient documentation

## 2019-07-08 DIAGNOSIS — Z7984 Long term (current) use of oral hypoglycemic drugs: Secondary | ICD-10-CM | POA: Insufficient documentation

## 2019-07-08 DIAGNOSIS — C786 Secondary malignant neoplasm of retroperitoneum and peritoneum: Secondary | ICD-10-CM | POA: Diagnosis not present

## 2019-07-08 DIAGNOSIS — Z9221 Personal history of antineoplastic chemotherapy: Secondary | ICD-10-CM | POA: Diagnosis not present

## 2019-07-08 DIAGNOSIS — C782 Secondary malignant neoplasm of pleura: Secondary | ICD-10-CM | POA: Insufficient documentation

## 2019-07-08 DIAGNOSIS — Z9013 Acquired absence of bilateral breasts and nipples: Secondary | ICD-10-CM | POA: Insufficient documentation

## 2019-07-08 DIAGNOSIS — R197 Diarrhea, unspecified: Secondary | ICD-10-CM | POA: Insufficient documentation

## 2019-07-08 DIAGNOSIS — C50919 Malignant neoplasm of unspecified site of unspecified female breast: Secondary | ICD-10-CM

## 2019-07-08 DIAGNOSIS — D649 Anemia, unspecified: Secondary | ICD-10-CM | POA: Diagnosis not present

## 2019-07-08 LAB — CBC WITH DIFFERENTIAL (CANCER CENTER ONLY)
Abs Immature Granulocytes: 0.01 10*3/uL (ref 0.00–0.07)
Basophils Absolute: 0 10*3/uL (ref 0.0–0.1)
Basophils Relative: 1 %
Eosinophils Absolute: 0.1 10*3/uL (ref 0.0–0.5)
Eosinophils Relative: 1 %
HCT: 31.9 % — ABNORMAL LOW (ref 36.0–46.0)
Hemoglobin: 10.2 g/dL — ABNORMAL LOW (ref 12.0–15.0)
Immature Granulocytes: 0 %
Lymphocytes Relative: 25 %
Lymphs Abs: 1.6 10*3/uL (ref 0.7–4.0)
MCH: 30.6 pg (ref 26.0–34.0)
MCHC: 32 g/dL (ref 30.0–36.0)
MCV: 95.8 fL (ref 80.0–100.0)
Monocytes Absolute: 0.6 10*3/uL (ref 0.1–1.0)
Monocytes Relative: 10 %
Neutro Abs: 3.9 10*3/uL (ref 1.7–7.7)
Neutrophils Relative %: 63 %
Platelet Count: 202 10*3/uL (ref 150–400)
RBC: 3.33 MIL/uL — ABNORMAL LOW (ref 3.87–5.11)
RDW: 18.8 % — ABNORMAL HIGH (ref 11.5–15.5)
WBC Count: 6.2 10*3/uL (ref 4.0–10.5)
nRBC: 0 % (ref 0.0–0.2)

## 2019-07-08 LAB — CMP (CANCER CENTER ONLY)
ALT: 8 U/L (ref 0–44)
AST: 23 U/L (ref 15–41)
Albumin: 3.2 g/dL — ABNORMAL LOW (ref 3.5–5.0)
Alkaline Phosphatase: 72 U/L (ref 38–126)
Anion gap: 14 (ref 5–15)
BUN: 26 mg/dL — ABNORMAL HIGH (ref 8–23)
CO2: 28 mmol/L (ref 22–32)
Calcium: 8.9 mg/dL (ref 8.9–10.3)
Chloride: 101 mmol/L (ref 98–111)
Creatinine: 1.01 mg/dL — ABNORMAL HIGH (ref 0.44–1.00)
GFR, Est AFR Am: >60 mL/min (ref >60)
GFR, Estimated: 54 mL/min — ABNORMAL LOW (ref >60)
Glucose, Bld: 137 mg/dL — ABNORMAL HIGH (ref 70–99)
Potassium: 5 mmol/L (ref 3.5–5.1)
Sodium: 143 mmol/L (ref 135–145)
Total Bilirubin: 0.6 mg/dL (ref 0.3–1.2)
Total Protein: 6.9 g/dL (ref 6.5–8.1)

## 2019-07-08 NOTE — Progress Notes (Signed)
I discussed the case with Dr. Jacqulynn Cadet who reviewed the patient's scans and determined that the left lobe of the liver would be the easiest to biopsy.  We will need to send the pathology for breast prognostic panel as well as [facility] molecular testing.

## 2019-07-08 NOTE — Progress Notes (Signed)
Brittany Porter Female, 74 y.o., Oct 25, 1945 MRN:  [id number] Phone:  [phone] (H) PCP:  [name], MD Coverage:  [place] With Radiology ([facility]-US 2) 07/23/2019 at 1:00 PM  RE: US Biopsy (liver) Received: Today Message Contents  Jacqulynn Cadet, MD  Garth Bigness D  Approved for US guided bx of left liver mass. Hx of breast cancer.   [facility]       Previous Messages   ----- Message -----  From: Garth Bigness D  Sent: 07/08/2019  5:02 PM EST  To: Jacqulynn Cadet, MD  Subject: US Biopsy (liver)                 Procedure: US Biopsy Liver   Reason: Carcinoma of breast metastatic to multiple sites, unspecified laterality, Liver metastases,   Discussed with Dr. Laurence Ferrari who is in agreement regarding the liver biopsy   History: CT in computer   Provider: Nicholas Lose   Provider Contact: [phone]

## 2019-07-08 NOTE — Assessment & Plan Note (Signed)
Metastatic breast cancer: recently progressive in liver and bone, also pleura.  Faslodex + Ibrance beginning 03/31/2016-March 2020 stopped due to progression Exemestane with everolimus discontinued 08/29/2018 due to hyperglycemia  [facility] molecular testing: Androgen receptor positive, PDL 1 neg, MSI stable, NTRK1/2/3Neg, T MBB intermediate 9 mutations/Mb, ESR 1-, PI 3 CA negative, BRCA negative (Xeloda discontinued 04/08/2019)  Hospitalization for severe anemia hemoglobin of 2.6 received 4 units of PRBC  CT CAP: 04/04/2019: Worsening liver metastatic disease with increase in number and size of the liver masses. Left lobe: 5.6 cm previously was 2.6 cm. Right lobe 4.1 cm previously 4 cm, 2.8 cm was 0.8 cm. Stable bone mets  Current treatment:Enzalutamide given the androgen receptor positivitystarted 04/15/2019 discontinued 07/08/2019 for progression Enzalutamide toxicities: Diarrhea: Instructed to take Imodium She has good appetite and feels generally well.  CT abdomen pelvis 07/01/2019: Progression of pleuroparenchymal, hepatic, peritoneal and bone metastases.  Trace ascites Return to clinic in6weeksfor follow-up with labs and scans done prior to the visit  Goals of care counseling: I discussed with the patient that her cancer is progressing on all of the current treatments and therefore the next step would be chemotherapy.  She previously progressed on Xeloda and Halaven as well.  The likelihood of response to systemic chemotherapy is fairly low of less than 25%.  Given her age, I recommended gemcitabine as a potential treatment option.  I believe combination chemotherapy might be too harsh on her.  Alternately we can also focus on stopping all treatment and focusing on quality towards the end-of-life.

## 2019-07-09 ENCOUNTER — Telehealth: Payer: Self-pay | Admitting: Hematology and Oncology

## 2019-07-09 NOTE — Telephone Encounter (Signed)
I talk with patient regarding schedule  

## 2019-07-16 ENCOUNTER — Other Ambulatory Visit: Payer: Self-pay | Admitting: Internal Medicine

## 2019-07-22 ENCOUNTER — Other Ambulatory Visit: Payer: Self-pay | Admitting: Radiology

## 2019-07-23 ENCOUNTER — Other Ambulatory Visit: Payer: Self-pay | Admitting: Hematology and Oncology

## 2019-07-23 ENCOUNTER — Ambulatory Visit (HOSPITAL_COMMUNITY)
Admission: RE | Admit: 2019-07-23 | Discharge: 2019-07-23 | Disposition: A | Discharge status: Home / Self Care | Payer: Medicare Other | Source: Ambulatory Visit | Attending: Hematology and Oncology | Admitting: Hematology and Oncology

## 2019-07-23 ENCOUNTER — Encounter (HOSPITAL_COMMUNITY): Payer: Self-pay

## 2019-07-23 ENCOUNTER — Other Ambulatory Visit: Payer: Self-pay

## 2019-07-23 DIAGNOSIS — Z923 Personal history of irradiation: Secondary | ICD-10-CM | POA: Insufficient documentation

## 2019-07-23 DIAGNOSIS — C50919 Malignant neoplasm of unspecified site of unspecified female breast: Secondary | ICD-10-CM

## 2019-07-23 DIAGNOSIS — Z79899 Other long term (current) drug therapy: Secondary | ICD-10-CM | POA: Insufficient documentation

## 2019-07-23 DIAGNOSIS — C7800 Secondary malignant neoplasm of unspecified lung: Secondary | ICD-10-CM | POA: Insufficient documentation

## 2019-07-23 DIAGNOSIS — C787 Secondary malignant neoplasm of liver and intrahepatic bile duct: Secondary | ICD-10-CM | POA: Diagnosis not present

## 2019-07-23 DIAGNOSIS — Z7984 Long term (current) use of oral hypoglycemic drugs: Secondary | ICD-10-CM | POA: Diagnosis not present

## 2019-07-23 DIAGNOSIS — C50911 Malignant neoplasm of unspecified site of right female breast: Secondary | ICD-10-CM

## 2019-07-23 DIAGNOSIS — E119 Type 2 diabetes mellitus without complications: Secondary | ICD-10-CM | POA: Diagnosis not present

## 2019-07-23 DIAGNOSIS — E785 Hyperlipidemia, unspecified: Secondary | ICD-10-CM | POA: Insufficient documentation

## 2019-07-23 DIAGNOSIS — C7951 Secondary malignant neoplasm of bone: Secondary | ICD-10-CM | POA: Diagnosis not present

## 2019-07-23 HISTORY — PX: IR US GUIDE BX ASP/DRAIN: IMG2392

## 2019-07-23 HISTORY — PX: IR IMAGING GUIDED PORT INSERTION: IMG5740

## 2019-07-23 LAB — CBC WITH DIFFERENTIAL/PLATELET
Abs Immature Granulocytes: 0.01 10*3/uL (ref 0.00–0.07)
Basophils Absolute: 0 10*3/uL (ref 0.0–0.1)
Basophils Relative: 1 %
Eosinophils Absolute: 0.1 10*3/uL (ref 0.0–0.5)
Eosinophils Relative: 1 %
HCT: 33.8 % — ABNORMAL LOW (ref 36.0–46.0)
Hemoglobin: 10.4 g/dL — ABNORMAL LOW (ref 12.0–15.0)
Immature Granulocytes: 0 %
Lymphocytes Relative: 24 %
Lymphs Abs: 1.1 10*3/uL (ref 0.7–4.0)
MCH: 30.7 pg (ref 26.0–34.0)
MCHC: 30.8 g/dL (ref 30.0–36.0)
MCV: 99.7 fL (ref 80.0–100.0)
Monocytes Absolute: 0.4 10*3/uL (ref 0.1–1.0)
Monocytes Relative: 9 %
Neutro Abs: 2.9 10*3/uL (ref 1.7–7.7)
Neutrophils Relative %: 65 %
Platelets: 211 10*3/uL (ref 150–400)
RBC: 3.39 MIL/uL — ABNORMAL LOW (ref 3.87–5.11)
RDW: 18.7 % — ABNORMAL HIGH (ref 11.5–15.5)
WBC: 4.5 10*3/uL (ref 4.0–10.5)
nRBC: 0 % (ref 0.0–0.2)

## 2019-07-23 LAB — COMPREHENSIVE METABOLIC PANEL
ALT: 12 U/L (ref 0–44)
AST: 34 U/L (ref 15–41)
Albumin: 3.4 g/dL — ABNORMAL LOW (ref 3.5–5.0)
Alkaline Phosphatase: 57 U/L (ref 38–126)
Anion gap: 11 (ref 5–15)
BUN: 25 mg/dL — ABNORMAL HIGH (ref 8–23)
CO2: 28 mmol/L (ref 22–32)
Calcium: 8.5 mg/dL — ABNORMAL LOW (ref 8.9–10.3)
Chloride: 101 mmol/L (ref 98–111)
Creatinine, Ser: 0.95 mg/dL (ref 0.44–1.00)
GFR calc Af Amer: >60 mL/min (ref >60)
GFR calc non Af Amer: 59 mL/min — ABNORMAL LOW (ref >60)
Glucose, Bld: 125 mg/dL — ABNORMAL HIGH (ref 70–99)
Potassium: 4.5 mmol/L (ref 3.5–5.1)
Sodium: 140 mmol/L (ref 135–145)
Total Bilirubin: 0.9 mg/dL (ref 0.3–1.2)
Total Protein: 6.9 g/dL (ref 6.5–8.1)

## 2019-07-23 LAB — GLUCOSE, CAPILLARY: Glucose-Capillary: 115 mg/dL — ABNORMAL HIGH (ref 70–99)

## 2019-07-23 LAB — PROTIME-INR
INR: 1 (ref 0.8–1.2)
Prothrombin Time: 13 seconds (ref 11.4–15.2)

## 2019-07-23 MED ORDER — FENTANYL CITRATE (PF) 100 MCG/2ML IJ SOLN
INTRAMUSCULAR | Status: AC
  Filled 2019-07-23: qty 2

## 2019-07-23 MED ORDER — LIDOCAINE HCL (PF) 1 % IJ SOLN
INTRAMUSCULAR | Status: AC | PRN
  Administered 2019-07-23: 10 mL via INTRADERMAL

## 2019-07-23 MED ORDER — HEPARIN SOD (PORK) LOCK FLUSH 100 UNIT/ML IV SOLN
INTRAVENOUS | Status: AC | PRN
  Administered 2019-07-23: 500 [IU] via INTRAVENOUS

## 2019-07-23 MED ORDER — CEFAZOLIN SODIUM-DEXTROSE 2-4 GM/100ML-% IV SOLN
INTRAVENOUS | Status: AC
  Filled 2019-07-23: qty 100

## 2019-07-23 MED ORDER — LIDOCAINE-EPINEPHRINE 1 %-1:100000 IJ SOLN
INTRAMUSCULAR | Status: AC
  Filled 2019-07-23: qty 1

## 2019-07-23 MED ORDER — CEFAZOLIN SODIUM-DEXTROSE 2-4 GM/100ML-% IV SOLN
2.0000 g | INTRAVENOUS | Status: AC
  Administered 2019-07-23: 2 g via INTRAVENOUS

## 2019-07-23 MED ORDER — HEPARIN SOD (PORK) LOCK FLUSH 100 UNIT/ML IV SOLN
INTRAVENOUS | Status: AC
  Filled 2019-07-23: qty 5

## 2019-07-23 MED ORDER — LIDOCAINE HCL 1 % IJ SOLN
INTRAMUSCULAR | Status: AC
  Filled 2019-07-23: qty 20

## 2019-07-23 MED ORDER — LIDOCAINE-EPINEPHRINE 1 %-1:100000 IJ SOLN
INTRAMUSCULAR | Status: AC | PRN
  Administered 2019-07-23 (×2): 10 mL via INTRADERMAL

## 2019-07-23 MED ORDER — MIDAZOLAM HCL 2 MG/2ML IJ SOLN
INTRAMUSCULAR | Status: AC
  Filled 2019-07-23: qty 4

## 2019-07-23 MED ORDER — FENTANYL CITRATE (PF) 100 MCG/2ML IJ SOLN
INTRAMUSCULAR | Status: AC | PRN
  Administered 2019-07-23 (×2): 50 ug via INTRAVENOUS

## 2019-07-23 MED ORDER — SODIUM CHLORIDE 0.9 % IV SOLN: INTRAVENOUS | Status: DC

## 2019-07-23 MED ORDER — GELATIN ABSORBABLE 12-7 MM EX MISC
CUTANEOUS | Status: AC
  Filled 2019-07-23: qty 1

## 2019-07-23 MED ORDER — GELATIN ABSORBABLE 12-7 MM EX MISC
CUTANEOUS | Status: AC | PRN
  Administered 2019-07-23: 1

## 2019-07-23 MED ORDER — MIDAZOLAM HCL 2 MG/2ML IJ SOLN
INTRAMUSCULAR | Status: AC | PRN
  Administered 2019-07-23 (×2): 1 mg via INTRAVENOUS
  Administered 2019-07-23: 0.5 mg via INTRAVENOUS
  Administered 2019-07-23: 1 mg via INTRAVENOUS
  Administered 2019-07-23: 0.5 mg via INTRAVENOUS

## 2019-07-23 NOTE — Sedation Documentation (Addendum)
Liver biopsy complete. Patient prepped for port a cath.

## 2019-07-23 NOTE — Sedation Documentation (Signed)
Port a cath placement procedure begun.

## 2019-07-23 NOTE — Discharge Instructions (Signed)
Urgent needs - IR on call MD [phone]  Wound - May remove dressing in 24 to 48 hours and shower.  Keep site clean and dry.  Replace with bandaid as necessary. Do not submerge in tub/ water until incision is healing well May use EMLA cream in 2 weeks if incision healing well  Your provider should schedule appointments for monthly port flushes after your therapy is completed.  Liver Biopsy, Care After These instructions give you information on caring for yourself after your procedure. Your doctor may also give you more specific instructions. Call your doctor if you have any problems or questions after your procedure. What can I expect after the procedure? After the procedure, it is common to have:  Pain and soreness where the biopsy was done.  Bruising around the area where the biopsy was done.  Sleepiness and be tired for a few days. Follow these instructions at home: Medicines  Take over-the-counter and prescription medicines only as told by your doctor.  If you were prescribed an antibiotic medicine, take it as told by your doctor. Do not stop taking the antibiotic even if you start to feel better.  Do not take medicines such as aspirin and ibuprofen. These medicines can thin your blood. Do not take these medicines unless your doctor tells you to take them.  If you are taking prescription pain medicine, take actions to prevent or treat constipation. Your doctor may recommend that you: ? Drink enough fluid to keep your pee (urine) clear or pale yellow. ? Take over-the-counter or prescription medicines. ? Eat foods that are high in fiber, such as fresh fruits and vegetables, whole grains, and beans. ? Limit foods that are high in fat and processed sugars, such as fried and sweet foods. Caring for your cut  Follow instructions from your doctor about how to take care of your cuts from surgery (incisions). Make sure you: ? Wash your hands with soap and water before you change your  bandage (dressing). If you cannot use soap and water, use hand sanitizer. ? Change your bandage as told by your doctor. ? Leave stitches (sutures), skin glue, or skin tape (adhesive) strips in place. They may need to stay in place for 2 weeks or longer. If tape strips get loose and curl up, you may trim the loose edges. Do not remove tape strips completely unless your doctor says it is okay.  Check your cuts every day for signs of infection. Check for: ? Redness, swelling, or more pain. ? Fluid or blood. ? Pus or a bad smell. ? Warmth.  Do not take baths, swim, or use a hot tub until your doctor says it is okay to do so. Activity  Rest at home for 1-2 days or as told by your doctor. ? Avoid sitting for a long time without moving. Get up to take short walks every 1-2 hours.  Return to your normal activities as told by your doctor. Ask what activities are safe for you.  Do not do these things in the first 24 hours: ? Drive. ? Use machinery. ? Take a bath or shower.  Do not lift more than 10 pounds (4.5 kg) or play contact sports for the first 2 weeks. General instructions  Do not drink alcohol in the first week after the procedure.  Have someone stay with you for at least 24 hours after the procedure.  Get your test results. Ask your doctor or the department that is doing the test: ? When  will my results be ready? ? How will I get my results? ? What are my treatment options? ? What other tests do I need? ? What are my next steps?  Keep all follow-up visits as told by your doctor. This is important. Contact a doctor if:  A cut bleeds and leaves more than just a small spot of blood.  A cut is red, puffs up (swells), or hurts more than before.  Fluid or something else comes from a cut.  A cut smells bad.  You have a fever or chills. Get help right away if:  You have swelling, bloating, or pain in your belly (abdomen).  You get dizzy or faint.  You have a rash.  You  feel sick to your stomach (nauseous) or throw up (vomit).  You have trouble breathing, feel short of breath, or feel faint.  Your chest hurts.  You have problems talking or seeing.  You have trouble with your balance or moving your arms or legs. Summary  After the procedure, it is common to have pain, soreness, bruising, and tiredness.  Your doctor will tell you how to take care of yourself at home. Change your bandage, take your medicines, and limit your activities as told by your doctor.  Call your doctor if you have symptoms of infection. Get help right away if your belly swells, your cut bleeds a lot, or you have trouble talking or breathing. This information is not intended to replace advice given to you by your health care provider. Make sure you discuss any questions you have with your health care provider. Document Revised: 05/12/2017 Document Reviewed: 05/12/2017 Elsevier Patient Education  [place] Insertion, Care After This sheet gives you information about how to care for yourself after your procedure. Your health care provider may also give you more specific instructions. If you have problems or questions, contact your health care provider. What can I expect after the procedure? After the procedure, it is common to have:  Discomfort at the port insertion site.  Bruising on the skin over the port. This should improve over 3-4 days. Follow these instructions at home: [facility] care  After your port is placed, you will get a manufacturer's information card. The card has information about your port. Keep this card with you at all times.  Take care of the port as told by your health care provider. Ask your health care provider if you or a family member can get training for taking care of the port at home. A home health care nurse may also take care of the port.  Make sure to remember what type of port you have. Incision care  Follow instructions  from your health care provider about how to take care of your port insertion site. Make sure you: ? Wash your hands with soap and water before and after you change your bandage (dressing). If soap and water are not available, use hand sanitizer. ? Change your dressing as told by your health care provider. ? Leave stitches (sutures), skin glue, or adhesive strips in place. These skin closures may need to stay in place for 2 weeks or longer. If adhesive strip edges start to loosen and curl up, you may trim the loose edges. Do not remove adhesive strips completely unless your health care provider tells you to do that.  Check your port insertion site every day for signs of infection. Check for: ? Redness, swelling, or pain. ? Fluid or  blood. ? Warmth. ? Pus or a bad smell. Activity  Return to your normal activities as told by your health care provider. Ask your health care provider what activities are safe for you.  Do not lift anything that is heavier than 10 lb (4.5 kg), or the limit that you are told, until your health care provider says that it is safe. General instructions  Take over-the-counter and prescription medicines only as told by your health care provider.  Do not take baths, swim, or use a hot tub until your health care provider approves. Ask your health care provider if you may take showers. You may only be allowed to take sponge baths.  Do not drive for 24 hours if you were given a sedative during your procedure.  Wear a medical alert bracelet in case of an emergency. This will tell any health care providers that you have a port.  Keep all follow-up visits as told by your health care provider. This is important. Contact a health care provider if:  You cannot flush your port with saline as directed, or you cannot draw blood from the port.  You have a fever or chills.  You have redness, swelling, or pain around your port insertion site.  You have fluid or blood coming from  your port insertion site.  Your port insertion site feels warm to the touch.  You have pus or a bad smell coming from the port insertion site. Get help right away if:  You have chest pain or shortness of breath.  You have bleeding from your port that you cannot control. Summary  Take care of the port as told by your health care provider. Keep the manufacturer's information card with you at all times.  Change your dressing as told by your health care provider.  Contact a health care provider if you have a fever or chills or if you have redness, swelling, or pain around your port insertion site.  Keep all follow-up visits as told by your health care provider. This information is not intended to replace advice given to you by your health care provider. Make sure you discuss any questions you have with your health care provider. Document Revised: 11/28/2017 Document Reviewed: 11/28/2017 Elsevier Patient Education  [place].   Moderate Conscious Sedation, Adult, Care After These instructions provide you with information about caring for yourself after your procedure. Your health care provider may also give you more specific instructions. Your treatment has been planned according to current medical practices, but problems sometimes occur. Call your health care provider if you have any problems or questions after your procedure. What can I expect after the procedure? After your procedure, it is common:  To feel sleepy for several hours.  To feel clumsy and have poor balance for several hours.  To have poor judgment for several hours.  To vomit if you eat too soon. Follow these instructions at home: For at least 24 hours after the procedure:  Do not: ? Participate in activities where you could fall or become injured. ? Drive. ? Use heavy machinery. ? Drink alcohol. ? Take sleeping pills or medicines that cause drowsiness. ? Make important decisions or sign legal  documents. ? Take care of children on your own.  Rest. Eating and drinking  Follow the diet recommended by your health care provider.  If you vomit: ? Drink water, juice, or soup when you can drink without vomiting. ? Make sure you have little or no nausea  before eating solid foods. General instructions  Have a responsible adult stay with you until you are awake and alert.  Take over-the-counter and prescription medicines only as told by your health care provider.  If you smoke, do not smoke without supervision.  Keep all follow-up visits as told by your health care provider. This is important. Contact a health care provider if:  You keep feeling nauseous or you keep vomiting.  You feel light-headed.  You develop a rash.  You have a fever. Get help right away if:  You have trouble breathing. This information is not intended to replace advice given to you by your health care provider. Make sure you discuss any questions you have with your health care provider. Document Revised: 04/14/2017 Document Reviewed: 08/22/2015 Elsevier Patient Education  2020 [employer].

## 2019-07-23 NOTE — Procedures (Signed)
Pre Procedure Dx: Poor venous access; Liver mass Post Procedural Dx: Same  Technically successful US guided biopsy of ill defined mass within the dome of the left lobe of the liver.   Successful placement of right IJ approach port-a-cath with tip at the superior caval atrial junction. The catheter is ready for immediate use.  Estimated Blood Loss: Trace Complications: None immediate.  Ronny Bacon, MD Pager #: [phone]

## 2019-07-23 NOTE — Consult Note (Signed)
Chief Complaint: Portacath access for metastatic breast cancer and liver biopsy for molecular testing  Referring Physician(s): Gudena,Vinay  Supervising Physician: Sandi Mariscal  Patient Status: [facility] - Out[facility]  History of Present Illness: [facility] is a 74 y.o. female History of recurrant metastatic breast cancer (bone, liver and lung) s/p bilateral mas  tectomy and axillary node dissection. Team is requesting portacath placement for chemotherapy access and liver biopsy for molecular testing .   Past Medical History:  Diagnosis Date  . Arthritis   . Breast cancer ([place])   . Diabetes mellitus   . Endometrial carcinoma ([place]) 10/2008  . Family history of breast cancer   . Family history of colon cancer   . Family history of skin cancer   . Fluid overload   . History of radiation therapy 04/13/2009, 04/16/2009, 04/27/2009, 05/07/2009, 05/18/2009   3000 cGy to proximal vagina  . Hyperlipidemia   . Iron deficiency   . Melanoma ([place]) 2010   rt arm and back  . Ovarian cancer ([place])   . Pleural effusion 04/2010    Past Surgical History:  Procedure Laterality Date  . ABDOMINAL HYSTERECTOMY  10/2008  . APPENDECTOMY    . ESOPHAGOGASTRODUODENOSCOPY (EGD) WITH PROPOFOL N/A 12/30/2018   Procedure: ESOPHAGOGASTRODUODENOSCOPY (EGD) WITH PROPOFOL;  Surgeon: Ronald Lobo, MD;  Location: [place];  Service: Endoscopy;  Laterality: N/A;  . INSERTION OF MESH  05/28/2012   Procedure: INSERTION OF MESH;  Surgeon: Adin Hector, MD;  Location: [place];  Service: General;  Laterality: N/A;  . KNEE ARTHROSCOPY  04/2010   Right knee  . LAPAROSCOPIC LYSIS OF ADHESIONS  05/28/2012   Procedure: LAPAROSCOPIC LYSIS OF ADHESIONS;  Surgeon: Adin Hector, MD;  Location: [place];  Service: General;  Laterality: N/A;  . MASTECTOMY  1997   Bilateral with lymph nodes  . Melanoma removal  02/2009, 07/2009  . VENTRAL HERNIA REPAIR  05/28/2012   Procedure: LAPAROSCOPIC VENTRAL HERNIA;  Surgeon: Adin Hector, MD;  Location: [place];  Service: General;  Laterality: N/A;    Allergies: Prednisolone, Prednisone, and Sulfa antibiotics  Medications: Prior to Admission medications   Medication Sig Start Date End Date Taking? Authorizing Provider  amiodarone (PACERONE) 200 MG tablet Take 1 tablet (200 mg total) by mouth daily. 06/28/19  Yes Elouise Munroe, MD  atorvastatin (LIPITOR) 20 MG tablet Take 1 tablet (20 mg total) by mouth daily at 6 PM. 04/24/19 07/23/19 Yes Elouise Munroe, MD  Calcium Carb-Cholecalciferol (CALCIUM 600+D3) 600-800 MG-UNIT TABS Take 1 tablet by mouth 2 (two) times daily.    Yes [provider]  Cholecalciferol (VITAMIN D3) 2000 UNITS TABS Take 2,000 mg by mouth daily.   Yes [provider]  gabapentin (NEURONTIN) 100 MG capsule Take 100 mg by mouth 2 (two) times daily. 07/27/18  Yes [provider]  metFORMIN (GLUCOPHAGE) 500 MG tablet Take 2 tablets (1,000 mg total) by mouth 2 (two) times daily with a meal. 01/03/19 07/23/19 Yes Alma Friendly, MD  metoprolol succinate (TOPROL-XL) 25 MG 24 hr tablet Take 1 tablet (25 mg total) by mouth daily. 10/15/18  Yes Elouise Munroe, MD  pantoprazole (PROTONIX) 40 MG tablet Take 1 tablet (40 mg total) by mouth 2 (two) times daily before a meal. 05/15/19 07/23/19 Yes Lendon Colonel, NP  ACCU-CHEK AVIVA PLUS test strip  05/15/19   [provider]  Accu-Chek FastClix Lancets [place]  05/15/19   [provider]  enzalutamide Gillermina Phy) 40  MG capsule Take 4 capsules (160 mg total) by mouth daily. 04/08/19   Nicholas Lose, MD  ferrous sulfate 325 (65 FE) MG tablet Take 325 mg by mouth daily as needed (energy).     [provider]  fish oil-omega-3 fatty acids 1000 MG capsule Take 1 g by mouth 2 (two) times daily.     [provider]  furosemide (LASIX) 20 MG tablet Take 1 tablet (20 mg total) by mouth daily. Reported on 05/07/2015 01/03/19   Alma Friendly, MD    Ipratropium-Albuterol (COMBIVENT RESPIMAT) 20-100 MCG/ACT AERS respimat Inhale 1 puff into the lungs every 6 (six) hours. Patient taking differently: Inhale 1 puff into the lungs every 6 (six) hours as needed for wheezing or shortness of breath.  11/27/18   Margaretha Seeds, MD  Magnesium Oxide (MAG-OXIDE) 200 MG TABS Take 1 tablet (200 mg total) by mouth daily. Patient taking differently: Take 200 mg by mouth at bedtime.  06/06/18   Isla Pence, MD  sodium chloride (OCEAN) 0.65 % SOLN nasal spray Place 1 spray into both nostrils as needed for congestion.    [provider]     Family History  Problem Relation Age of Onset  . Stroke Mother   . Cancer Father        'voice box'  . Colon cancer Sister        dx >50  . Heart attack Brother   . Skin cancer Brother 56  . Cancer Sister        primary site unk, dx >50  . Diabetes Brother   . Stroke Brother   . Stroke Sister   . ALS Sister   . Colon cancer Sister 60  . Dementia Sister   . Breast cancer Sister        dx >5-, met to brain    Social History   Socioeconomic History  . Marital status: Married    Spouse name: Not on file  . Number of children: Not on file  . Years of education: Not on file  . Highest education level: Not on file  Occupational History  . Not on file  Tobacco Use  . Smoking status: Never Smoker  . Smokeless tobacco: Never Used  Substance and Sexual Activity  . Alcohol use: No  . Drug use: No  . Sexual activity: Yes  Other Topics Concern  . Not on file  Social History Narrative  . Not on file   Social Determinants of Health   Financial Resource Strain:   . Difficulty of Paying Living Expenses: Not on file  Food Insecurity:   . Worried About Charity fundraiser in the Last Year: Not on file  . Ran Out of Food in the Last Year: Not on file  Transportation Needs:   . Lack of Transportation (Medical): Not on file  . Lack of Transportation (Non-Medical): Not on file  Physical  Activity:   . Days of Exercise per Week: Not on file  . Minutes of Exercise per Session: Not on file  Stress:   . Feeling of Stress : Not on file  Social Connections:   . Frequency of Communication with Friends and Family: Not on file  . Frequency of Social Gatherings with Friends and Family: Not on file  . Attends Religious Services: Not on file  . Active Member of Clubs or Organizations: Not on file  . Attends Archivist Meetings: Not on file  . Marital Status: Not  on file    Review of Systems: A 12 point ROS discussed and pertinent positives are indicated in the HPI above.  All other systems are negative.  Review of Systems  Constitutional: Negative for fatigue and fever.  HENT: Negative for congestion.   Respiratory: Negative for cough and shortness of breath.   Gastrointestinal: Negative for abdominal pain, diarrhea, nausea and vomiting.    Vital Signs: BP 154/78, HR 69 Resp 16 temp 98.5   Physical Exam Vitals and nursing note reviewed.  Constitutional:      Appearance: She is well-developed.  HENT:     Head: Normocephalic and atraumatic.  Eyes:     Conjunctiva/sclera: Conjunctivae normal.  Cardiovascular:     Rate and Rhythm: Normal rate and regular rhythm.     Heart sounds: Normal heart sounds.  Pulmonary:     Effort: Pulmonary effort is normal.     Breath sounds: Normal breath sounds.  Musculoskeletal:     Cervical back: Normal range of motion.  Neurological:     Mental Status: She is alert and oriented to person, place, and time.     Imaging: CT Abdomen Pelvis W Contrast  Result Date: 07/01/2019 CLINICAL DATA:  Staging breast cancer. Oral chemotherapy in progress. EXAM: CT ABDOMEN AND PELVIS WITH CONTRAST TECHNIQUE: Multidetector CT imaging of the abdomen and pelvis was performed using the standard protocol following bolus administration of intravenous contrast. CONTRAST:  132m OMNIPAQUE IOHEXOL 300 MG/ML  SOLN COMPARISON:  04/04/2019. FINDINGS:  Lower chest: Mild subpleural radiation fibrosis in the right middle lobe. Fissural nodularity on the right, measuring up to 7 mm along the inferior major fissure (6/34), the largest nodule being new. Nodular and masslike consolidation in the left lower lobe appears progressive. Index nodule measures 0.8 x 1.3 cm (6/14), compared to 7 x 7 mm on 04/03/2019. Nodular volume loss in the lingula, similar. Thin rind of pleural fluid at the base of the left hemithorax with pleural thickening and calcification. Adjacent extrapleural lymph nodes in the medial lower left hemithorax, measuring up to 7 mm, similar. Heart is enlarged. Small amount of pericardial fluid appears similar and may be physiologic. Hepatobiliary: Heterogeneous masses in both lobes of the liver are again seen, measuring up to 5.3 x 5.8 cm in the dome of the left hepatic lobe (2/22), previously 4.1 x 5.6 cm. Index lesion in the right hepatic lobe measures 3.3 x 4.2 cm (2/24), compared to 3.0 x 4.1 cm on 04/04/2019. A tiny stone is seen in the gallbladder. No biliary ductal dilatation. Pancreas: Negative. Spleen: Negative. Adrenals/Urinary Tract: Adrenal glands are unremarkable. Subcentimeter low-attenuation lesions in the right kidney are too small to definitively characterize but statistically, cysts are likely. Ureters are decompressed. Bladder is grossly unremarkable. Stomach/Bowel: Stomach, small bowel and colon are unremarkable. Appendix is not well-visualized. Vascular/Lymphatic: Atherosclerotic calcification of the aorta without aneurysm. Peripherally calcified splenic artery aneurysms measure up to 12 mm. Abdominal retroperitoneal lymph nodes are not enlarged by CT size criteria. Reproductive: Hysterectomy.  No adnexal mass. Other: Omental nodularity has increased. Index nodule in the left paramidline abdomen measures 11 mm (2/33), compared to 5 mm on 04/04/2019. Peritoneal thickening in the perisplenic region. Trace perihepatic and perisplenic  ascites. Ventral hernia repair. Bilateral inguinal hernias contain fat. Musculoskeletal: A faint sclerotic lesion in the ischial tuberosity has enlarged slightly, measuring 6 mm (2/89), previously 3 mm. Sclerotic lesion in the left femoral head is unchanged. Sclerotic lesion in the left iliac wing measures approximately 1.9 x 5.3 cm (  2/74), increased from 1.7 x 4.9 cm. Sclerotic lesions are seen throughout the spine, similar. Additional sclerotic lesions are seen in the right scapula, sternum as well as in the left fourth, seventh and ninth ribs grade 1 anterolisthesis of L4 on L5 with retrolisthesis of L3 on L4 and L2 on L3, as before. IMPRESSION: 1. Progressive pleuroparenchymal, hepatic, peritoneal and osseous metastatic disease. 2. Trace ascites. 3. Cholelithiasis. 4. Splenic artery aneurysms. 5.  Aortic atherosclerosis (ICD10-I70.0). Electronically Signed   By: Lorin Picket M.D.   On: 07/01/2019 11:00    Labs:  CBC: Recent Labs    04/29/19 1044 05/27/19 1053 07/08/19 0902 07/23/19 1155  WBC 5.7 6.3 6.2 4.5  HGB 10.1* 10.2* 10.2* 10.4*  HCT 32.0* 32.4* 31.9* 33.8*  PLT 227 227 202 211    COAGS: Recent Labs    12/28/18 2140 12/30/18 0658 01/01/19 0500 01/02/19 0634 01/03/19 0500 07/23/19 1155  INR 3.1*   < > 1.2 1.2 1.2 1.0  APTT 31  --   --   --   --   --    < > = values in this interval not displayed.    BMP: Recent Labs    04/04/19 1108 04/29/19 1044 05/27/19 1053 07/08/19 0902  NA 142 142 141 143  K 4.8 4.8 5.0 5.0  CL 101 103 101 101  CO2 '28 28 28 28  ' GLUCOSE 162* 131* 195* 137*  BUN 25* 27* 28* 26*  CALCIUM 7.9* 8.0* 8.2* 8.9  CREATININE 1.35* 1.30* 1.32* 1.01*  GFRNONAA 38* 40* 39* 54*  GFRAA 44* 46* 46* >60    LIVER FUNCTION TESTS: Recent Labs    04/04/19 1108 04/29/19 1044 05/27/19 1053 07/08/19 0902  BILITOT 0.9 0.6 0.6 0.6  AST '22 20 22 23  ' ALT '9 7 8 8  ' ALKPHOS 55 52 67 72  PROT 6.9 6.7 6.9 6.9  ALBUMIN 3.5 3.2* 3.3* 3.2*     Assessment and Plan:  74 y.o, female outpatient. History of recurrant metastatic breast cancer (bone, liver and lung) s/p bilateral mastectomy and axillary node dissection. Team is requesting portacath placement for chemotherapy access and liver biopsy for molecular testing .  Pertinent Imaging 2.15.21 - CT abd pelvis  Pertinent IR History none  Pertinent Allergies prednisone Sulfa   All labs and medications are within acceptable parameters.  Patient is afebrile.   Risks and benefits of liver was discussed with the patient including, but not limited to bleeding, infection, damage to adjacent structures or low yield requiring additional tests.  Risks and benefits of image guided port-a-catheter placement was discussed with the patient including, but not limited to bleeding, infection, pneumothorax, or fibrin sheath development and need for additional procedures.  All of the questions were answered and there is agreement to proceed.  Consents signed and in chart.   Thank you for this interesting consult.  I greatly enjoyed meeting CLARANN HELVEY and look forward to participating in their care.  A copy of this report was sent to the requesting provider on this date.  Electronically Signed: Avel Peace, NP 07/23/2019, 12:31 PM   I spent a total of     in face to face in clinical consultation, greater than 50% of which was counseling/coordinating care for liver biopsy and portacath placement

## 2019-07-29 LAB — SURGICAL PATHOLOGY

## 2019-07-29 NOTE — Progress Notes (Signed)
Patient Care Team: [name], MD as PCP - General (Family Medicine)  DIAGNOSIS:    ICD-10-CM   1. 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 BRCA reportedly normal.   08/25/2015 Relapse/Recurrence   new solitary liver lesion, biopsy showing metastatic ER PR + breast   09/25/2015 - 03/11/2016 Chemotherapy   Xeloda. Stopped due to progression of liver and bone metastases; pleural metastases   03/31/2016 - 07/19/2018 Anti-estrogen oral therapy   Foslodex started 03/31/16, currently monthly  Ibrance 134m 3 weeks on, 1 week off started 03/31/16 Xgeva started on 05/05/16 Letrozole daily added 08/01/17    03/20/2018 Genetic Testing   Genetic testing performed through [facility]'s Multi-Cancer reported out on 03/19/2018 showed no pathogenic mutations. The Multi-Cancer Panel offered by [facility] includes sequencing and/or deletion duplication testing of the following 91 genes: AIP, ALK, APC, ATM, AXIN2, BAP1, BARD1, BLM, BMPR1A, BRCA1, BRCA2, BRIP1, BUB1B, CASR, CDC73, CDH1, CDK4, CDKN1B, CDKN1C, CDKN2A, CEBPA, CEP57, CHEK2, CTNNA1, DICER1, DIS3L2, EGFR, ENG, EPCAM, FH, FLCN, GALNT12, GATA2, GPC3, GREM1, HOXB13, HRAS, KIT, MAX, MEN1, MET, MITF, MLH1, MLH3, MSH2, MSH3, MSH6, MUTYH, NBN, NF1, NF2, NTHL1, PALB2, PDGFRA, PHOX2B, PMS2, POLD1, POLE, POT1, PRKAR1A, PTCH1, PTEN, RAD50, RAD51C, RAD51D, RB1, RECQL4,  RET, RNF43, RPS20, RUNX1, SDHA, SDHAF2, SDHB, SDHC, SDHD, SMAD4, SMARCA4, SMARCB1, SMARCE1, STK11, SUFU, TERC, TERT, TMEM127, TP53, TSC1, TSC2, VHL, WRN, WT1  A variant of uncertain significance (VUS) in a gene called MET was also noted. [id number] (p.Thr557Ala)   04/08/2018 Miscellaneous   [facility] molecular testing: Androgen receptor positive, PDL 1 neg, MSI stable, NTRK1/2/3Neg, T MBB intermediate 9 mutations/Mb, ESR 1-, PI 3 CA negative, BRCA negative   07/20/2018 - 08/30/2018 Anti-estrogen oral therapy   Exemestane with everolimus.  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 with above-mentioned history of metastatic breast cancerwith metastases to thebone,liver,and lungswhois currently on treatment withenzalutamideandXgeva. She underwent a liver biopsy on 07/23/19 for which pathology showed metastatic breast carcinoma, ER/PR+ 90%, Ki67 20%, HER-2 negative by FISH. She presentsto the clinic today to review her biopsy and discuss further treatment.  ALLERGIES:  is allergic to prednisolone; prednisone; and sulfa antibiotics.  MEDICATIONS:  Current Outpatient Medications  Medication Sig Dispense Refill  .  ACCU-CHEK AVIVA PLUS test strip     . Accu-Chek FastClix Lancets MISC     . amiodarone (PACERONE) 200 MG tablet Take 1 tablet (200 mg total) by mouth daily. 90 tablet 3  . atorvastatin (LIPITOR) 20 MG tablet Take 1 tablet (20 mg total) by mouth daily at 6 PM. 45 tablet 9  . Calcium Carb-Cholecalciferol (CALCIUM 600+D3) 600-800 MG-UNIT TABS Take 1 tablet by mouth 2 (two) times daily.     . Cholecalciferol (VITAMIN D3) 2000 UNITS TABS Take 2,000 mg by mouth daily.    . enzalutamide (XTANDI) 40 MG capsule Take 4 capsules (160 mg total) by mouth daily. 120 capsule 3  . ferrous sulfate 325 (65 FE) MG tablet Take 325 mg by mouth daily as needed (energy).     . fish oil-omega-3 fatty acids 1000 MG capsule Take 1 g by mouth 2 (two) times daily.     . furosemide (LASIX) 20 MG tablet Take 1 tablet (20 mg total) by mouth daily. Reported on 05/07/2015 30 tablet 0  . gabapentin (NEURONTIN) 100 MG capsule Take 100 mg by mouth 2 (two) times daily.    . Ipratropium-Albuterol (COMBIVENT RESPIMAT) 20-100 MCG/ACT AERS respimat Inhale 1 puff into the lungs every 6 (six) hours. (Patient taking differently: Inhale 1 puff into the lungs every 6 (six) hours as needed for wheezing or shortness of breath. ) 4 g 6  . Magnesium Oxide (MAG-OXIDE) 200 MG TABS Take 1 tablet (200 mg total) by mouth daily. (Patient taking differently: Take 200 mg by mouth at bedtime. ) 30 tablet 0  . metFORMIN (GLUCOPHAGE) 500 MG tablet Take 2 tablets (1,000 mg total) by mouth 2 (two) times daily with a meal. 120 tablet 0  . metoprolol succinate (TOPROL-XL) 25 MG 24 hr tablet Take 1 tablet (25 mg total) by mouth daily. 90 tablet 3  . pantoprazole (PROTONIX) 40 MG tablet Take 1 tablet (40 mg total) by mouth 2 (two) times daily before a meal. 60 tablet 6  . sodium chloride (OCEAN) 0.65 % SOLN nasal spray Place 1 spray into both nostrils as needed for congestion.     No current facility-administered medications for this visit.    PHYSICAL  EXAMINATION: ECOG PERFORMANCE STATUS: 1 - Symptomatic but completely ambulatory  Vitals:   07/30/19 0844  BP: (!) 159/88  Pulse: 71  Resp: 17  Temp: 98 F (36.7 C)  SpO2: 98%   Filed Weights   07/30/19 0844  Weight: 167 lb (75.8 kg)    LABORATORY DATA:  I have reviewed the data as listed CMP Latest Ref Rng & Units 07/23/2019 07/08/2019 05/27/2019  Glucose 70 - 99 mg/dL 125(H) 137(H) 195(H)  BUN 8 - 23 mg/dL 25(H) 26(H) 28(H)  Creatinine 0.44 - 1.00 mg/dL 0.95 1.01(H) 1.32(H)  Sodium 135 - 145 mmol/L 140 143 141  Potassium 3.5 - 5.1 mmol/L 4.5 5.0 5.0  Chloride 98 - 111 mmol/L 101 101 101  CO2 22 - 32 mmol/L '28 28 28  ' Calcium 8.9 - 10.3 mg/dL 8.5(L) 8.9 8.2(L)  Total Protein 6.5 - 8.1 g/dL 6.9 6.9 6.9  Total Bilirubin 0.3 - 1.2 mg/dL 0.9 0.6 0.6  Alkaline Phos 38 - 126 U/L 57 72 67  AST 15 - 41 U/L 34 23 22  ALT 0 - 44 U/L '12 8 8    ' Lab Results  Component Value Date   WBC 4.5 07/23/2019   HGB  10.4 (L) 07/23/2019   HCT 33.8 (L) 07/23/2019   MCV 99.7 07/23/2019   PLT 211 07/23/2019   NEUTROABS 2.9 07/23/2019    ASSESSMENT & PLAN:  Breast cancer metastasized to liver [facility]) Metastatic breast cancer: recently progressive in liver and bone, also pleura.  Faslodex + Ibrance beginning 03/31/2016-March 2020 stopped due to progression Exemestane with everolimus discontinued 08/29/2018 due to hyperglycemia  [facility] molecular testing: Androgen receptor positive, PDL 1 neg, MSI stable, NTRK1/2/3Neg, T MBB intermediate 9 mutations/Mb, ESR 1-, PI 3 CA negative, BRCA negative (Xeloda discontinued 04/08/2019)  Hospitalization for severe anemia hemoglobin of 2.6 received 4 units of PRBC  CT CAP: 04/04/2019: Worsening liver metastatic disease with increase in number and size of the liver masses. Left lobe: 5.6 cm previously was 2.6 cm. Right lobe 4.1 cm previously 4 cm, 2.8 cm was 0.8 cm. Stable bone mets  Current treatment:Enzalutamide given the androgen receptor  positivitystarted 04/15/2019 discontinued 07/08/2019 for progression Enzalutamide toxicities: Diarrhea: Instructed to take Imodium She has good appetite and feels generally well.  CT abdomen pelvis 07/01/2019: Progression of pleuroparenchymal, hepatic, peritoneal and bone metastases.  Trace ascites Liver biopsy March 2021: Metastatic breast cancer: ER 90% positive, PR 90% positive, Ki-67 20%, HER-2 equivocal 2+, FISH negative ratio 1.2  Treatment plan: Discussed different options including tamoxifen with Faslodex Also discussed the chemotherapy with CMF or gemcitabine (she has a new port) We decided to try tamoxifen and Fosamax for 3 months and then change depending on the response to the treatment.   She will come for loading doses of Faslodex and then go on maintenance. Return to clinic in 1 month to see me.    No orders of the defined types were placed in this encounter.  The patient has a good understanding of the overall plan. she agrees with it. she will call with any problems that may develop before the next visit here.  Total time spent: 30 mins including face to face time and time spent for planning, charting and coordination of care  Nicholas Lose, MD 07/30/2019  I, Cloyde Reams Dorshimer, am acting as scribe for Dr. Nicholas Lose.  I have reviewed the above documentation for accuracy and completeness, and I agree with the above.

## 2019-07-30 ENCOUNTER — Other Ambulatory Visit: Payer: Self-pay

## 2019-07-30 ENCOUNTER — Inpatient Hospital Stay: Payer: Medicare Other | Attending: Hematology and Oncology | Admitting: Hematology and Oncology

## 2019-07-30 DIAGNOSIS — Z923 Personal history of irradiation: Secondary | ICD-10-CM | POA: Diagnosis not present

## 2019-07-30 DIAGNOSIS — R197 Diarrhea, unspecified: Secondary | ICD-10-CM | POA: Insufficient documentation

## 2019-07-30 DIAGNOSIS — C782 Secondary malignant neoplasm of pleura: Secondary | ICD-10-CM | POA: Insufficient documentation

## 2019-07-30 DIAGNOSIS — Z170 Estrogen receptor positive status [ER+]: Secondary | ICD-10-CM | POA: Insufficient documentation

## 2019-07-30 DIAGNOSIS — Z79899 Other long term (current) drug therapy: Secondary | ICD-10-CM | POA: Insufficient documentation

## 2019-07-30 DIAGNOSIS — Z9013 Acquired absence of bilateral breasts and nipples: Secondary | ICD-10-CM | POA: Diagnosis not present

## 2019-07-30 DIAGNOSIS — C787 Secondary malignant neoplasm of liver and intrahepatic bile duct: Secondary | ICD-10-CM | POA: Diagnosis present

## 2019-07-30 DIAGNOSIS — Z9221 Personal history of antineoplastic chemotherapy: Secondary | ICD-10-CM | POA: Diagnosis not present

## 2019-07-30 DIAGNOSIS — C7951 Secondary malignant neoplasm of bone: Secondary | ICD-10-CM | POA: Diagnosis present

## 2019-07-30 DIAGNOSIS — Z7984 Long term (current) use of oral hypoglycemic drugs: Secondary | ICD-10-CM | POA: Diagnosis not present

## 2019-07-30 DIAGNOSIS — C50911 Malignant neoplasm of unspecified site of right female breast: Secondary | ICD-10-CM

## 2019-07-30 MED ORDER — TAMOXIFEN CITRATE 20 MG PO TABS: 20.0000 mg | ORAL_TABLET | Freq: Every day | ORAL | 3 refills | Status: DC

## 2019-07-30 NOTE — Assessment & Plan Note (Signed)
Metastatic breast cancer: recently progressive in liver and bone, also pleura.  Faslodex + Ibrance beginning 03/31/2016-March 2020 stopped due to progression Exemestane with everolimus discontinued 08/29/2018 due to hyperglycemia  [facility] molecular testing: Androgen receptor positive, PDL 1 neg, MSI stable, NTRK1/2/3Neg, T MBB intermediate 9 mutations/Mb, ESR 1-, PI 3 CA negative, BRCA negative (Xeloda discontinued 04/08/2019)  Hospitalization for severe anemia hemoglobin of 2.6 received 4 units of PRBC  CT CAP: 04/04/2019: Worsening liver metastatic disease with increase in number and size of the liver masses. Left lobe: 5.6 cm previously was 2.6 cm. Right lobe 4.1 cm previously 4 cm, 2.8 cm was 0.8 cm. Stable bone mets  Current treatment:Enzalutamide given the androgen receptor positivitystarted 04/15/2019 discontinued 07/08/2019 for progression Enzalutamide toxicities: Diarrhea: Instructed to take Imodium She has good appetite and feels generally well.  CT abdomen pelvis 07/01/2019: Progression of pleuroparenchymal, hepatic, peritoneal and bone metastases.  Trace ascites Liver biopsy March 2021: Metastatic breast cancer: ER 90% positive, PR 90% positive, Ki-67 20%, HER-2 equivocal 2+, FISH negative ratio 1.2  Treatment plan: Discussed different options including tamoxifen with Faslodex Also discussed the chemotherapy with CMF or gemcitabine

## 2019-08-02 ENCOUNTER — Telehealth: Payer: Self-pay | Admitting: Hematology and Oncology

## 2019-08-02 NOTE — Telephone Encounter (Signed)
Scheduled per los, patient has been called and notified. 

## 2019-08-06 ENCOUNTER — Inpatient Hospital Stay: Payer: Medicare Other

## 2019-08-06 ENCOUNTER — Other Ambulatory Visit: Payer: Self-pay

## 2019-08-06 VITALS — BP 148/78 | HR 72 | Temp 98.2°F | Resp 18

## 2019-08-06 DIAGNOSIS — C7951 Secondary malignant neoplasm of bone: Secondary | ICD-10-CM

## 2019-08-06 DIAGNOSIS — C50919 Malignant neoplasm of unspecified site of unspecified female breast: Secondary | ICD-10-CM

## 2019-08-06 DIAGNOSIS — C50911 Malignant neoplasm of unspecified site of right female breast: Secondary | ICD-10-CM | POA: Diagnosis not present

## 2019-08-06 MED ORDER — DENOSUMAB 120 MG/1.7ML ~~LOC~~ SOLN
SUBCUTANEOUS | Status: AC
  Filled 2019-08-06: qty 1.7

## 2019-08-06 MED ORDER — FULVESTRANT 250 MG/5ML IM SOLN
INTRAMUSCULAR | Status: AC
  Filled 2019-08-06: qty 10

## 2019-08-06 MED ORDER — DENOSUMAB 120 MG/1.7ML ~~LOC~~ SOLN
120.0000 mg | Freq: Once | SUBCUTANEOUS | Status: AC
  Administered 2019-08-06: 120 mg via SUBCUTANEOUS

## 2019-08-06 MED ORDER — FULVESTRANT 250 MG/5ML IM SOLN
500.0000 mg | Freq: Once | INTRAMUSCULAR | Status: AC
  Administered 2019-08-06: 500 mg via INTRAMUSCULAR

## 2019-08-06 NOTE — Progress Notes (Signed)
Patient here today for xgeva and faslodex injections. Her last CMET was drawn two weeks ago and her calcium was 8.5. Dr. Lindi Adie is aware of this and says it is ok to proceed with injections at this time.

## 2019-08-06 NOTE — Patient Instructions (Signed)
Denosumab injection What is this medicine? DENOSUMAB (den oh sue mab) slows bone breakdown. Prolia is used to treat osteoporosis in women after menopause and in men, and in people who are taking corticosteroids for 6 months or more. Xgeva is used to treat a high calcium level due to cancer and to prevent bone fractures and other bone problems caused by multiple myeloma or cancer bone metastases. Xgeva is also used to treat giant cell tumor of the bone. This medicine may be used for other purposes; ask your health care provider or pharmacist if you have questions. COMMON BRAND NAME(S): Prolia, XGEVA What should I tell my health care provider before I take this medicine? They need to know if you have any of these conditions:  dental disease  having surgery or tooth extraction  infection  kidney disease  low levels of calcium or Vitamin D in the blood  malnutrition  on hemodialysis  skin conditions or sensitivity  thyroid or parathyroid disease  an unusual reaction to denosumab, other medicines, foods, dyes, or preservatives  pregnant or trying to get pregnant  breast-feeding How should I use this medicine? This medicine is for injection under the skin. It is given by a health care professional in a hospital or clinic setting. A special MedGuide will be given to you before each treatment. Be sure to read this information carefully each time. For Prolia, talk to your pediatrician regarding the use of this medicine in children. Special care may be needed. For Xgeva, talk to your pediatrician regarding the use of this medicine in children. While this drug may be prescribed for children as young as 13 years for selected conditions, precautions do apply. Overdosage: If you think you have taken too much of this medicine contact a poison control center or emergency room at once. NOTE: This medicine is only for you. Do not share this medicine with others. What if I miss a dose? It is  important not to miss your dose. Call your doctor or health care professional if you are unable to keep an appointment. What may interact with this medicine? Do not take this medicine with any of the following medications:  other medicines containing denosumab This medicine may also interact with the following medications:  medicines that lower your chance of fighting infection  steroid medicines like prednisone or cortisone This list may not describe all possible interactions. Give your health care provider a list of all the medicines, herbs, non-prescription drugs, or dietary supplements you use. Also tell them if you smoke, drink alcohol, or use illegal drugs. Some items may interact with your medicine. What should I watch for while using this medicine? Visit your doctor or health care professional for regular checks on your progress. Your doctor or health care professional may order blood tests and other tests to see how you are doing. Call your doctor or health care professional for advice if you get a fever, chills or sore throat, or other symptoms of a cold or flu. Do not treat yourself. This drug may decrease your body's ability to fight infection. Try to avoid being around people who are sick. You should make sure you get enough calcium and vitamin D while you are taking this medicine, unless your doctor tells you not to. Discuss the foods you eat and the vitamins you take with your health care professional. See your dentist regularly. Brush and floss your teeth as directed. Before you have any dental work done, tell your dentist you are   receiving this medicine. Do not become pregnant while taking this medicine or for 5 months after stopping it. Talk with your doctor or health care professional about your birth control options while taking this medicine. Women should inform their doctor if they wish to become pregnant or think they might be pregnant. There is a potential for serious side  effects to an unborn child. Talk to your health care professional or pharmacist for more information. What side effects may I notice from receiving this medicine? Side effects that you should report to your doctor or health care professional as soon as possible:  allergic reactions like skin rash, itching or hives, swelling of the face, lips, or tongue  bone pain  breathing problems  dizziness  jaw pain, especially after dental work  redness, blistering, peeling of the skin  signs and symptoms of infection like fever or chills; cough; sore throat; pain or trouble passing urine  signs of low calcium like fast heartbeat, muscle cramps or muscle pain; pain, tingling, numbness in the hands or feet; seizures  unusual bleeding or bruising  unusually weak or tired Side effects that usually do not require medical attention (report to your doctor or health care professional if they continue or are bothersome):  constipation  diarrhea  headache  joint pain  loss of appetite  muscle pain  runny nose  tiredness  upset stomach This list may not describe all possible side effects. Call your doctor for medical advice about side effects. You may report side effects to FDA at 1-800-FDA-1088. Where should I keep my medicine? This medicine is only given in a clinic, doctor's office, or other health care setting and will not be stored at home. NOTE: This sheet is a summary. It may not cover all possible information. If you have questions about this medicine, talk to your doctor, pharmacist, or health care provider.  2020 Elsevier/Gold Standard (2017-09-08 16:10:44) Fulvestrant injection What is this medicine? FULVESTRANT (ful VES trant) blocks the effects of estrogen. It is used to treat breast cancer. This medicine may be used for other purposes; ask your health care provider or pharmacist if you have questions. COMMON BRAND NAME(S): FASLODEX What should I tell my health care  provider before I take this medicine? They need to know if you have any of these conditions:  bleeding disorders  liver disease  low blood counts, like low white cell, platelet, or red cell counts  an unusual or allergic reaction to fulvestrant, other medicines, foods, dyes, or preservatives  pregnant or trying to get pregnant  breast-feeding How should I use this medicine? This medicine is for injection into a muscle. It is usually given by a health care professional in a hospital or clinic setting. Talk to your pediatrician regarding the use of this medicine in children. Special care may be needed. Overdosage: If you think you have taken too much of this medicine contact a poison control center or emergency room at once. NOTE: This medicine is only for you. Do not share this medicine with others. What if I miss a dose? It is important not to miss your dose. Call your doctor or health care professional if you are unable to keep an appointment. What may interact with this medicine?  medicines that treat or prevent blood clots like warfarin, enoxaparin, dalteparin, apixaban, dabigatran, and rivaroxaban This list may not describe all possible interactions. Give your health care provider a list of all the medicines, herbs, non-prescription drugs, or dietary supplements you use.   Also tell them if you smoke, drink alcohol, or use illegal drugs. Some items may interact with your medicine. What should I watch for while using this medicine? Your condition will be monitored carefully while you are receiving this medicine. You will need important blood work done while you are taking this medicine. Do not become pregnant while taking this medicine or for at least 1 year after stopping it. Women of child-bearing potential will need to have a negative pregnancy test before starting this medicine. Women should inform their doctor if they wish to become pregnant or think they might be pregnant. There is  a potential for serious side effects to an unborn child. Men should inform their doctors if they wish to father a child. This medicine may lower sperm counts. Talk to your health care professional or pharmacist for more information. Do not breast-feed an infant while taking this medicine or for 1 year after the last dose. What side effects may I notice from receiving this medicine? Side effects that you should report to your doctor or health care professional as soon as possible:  allergic reactions like skin rash, itching or hives, swelling of the face, lips, or tongue  feeling faint or lightheaded, falls  pain, tingling, numbness, or weakness in the legs  signs and symptoms of infection like fever or chills; cough; flu-like symptoms; sore throat  vaginal bleeding Side effects that usually do not require medical attention (report to your doctor or health care professional if they continue or are bothersome):  aches, pains  constipation  diarrhea  headache  hot flashes  nausea, vomiting  pain at site where injected  stomach pain This list may not describe all possible side effects. Call your doctor for medical advice about side effects. You may report side effects to FDA at 1-800-FDA-1088. Where should I keep my medicine? This drug is given in a hospital or clinic and will not be stored at home. NOTE: This sheet is a summary. It may not cover all possible information. If you have questions about this medicine, talk to your doctor, pharmacist, or health care provider.  2020 Elsevier/Gold Standard (2017-08-10 11:34:41)  

## 2019-08-20 ENCOUNTER — Other Ambulatory Visit: Payer: Self-pay

## 2019-08-20 ENCOUNTER — Inpatient Hospital Stay: Payer: Medicare Other | Attending: Hematology and Oncology

## 2019-08-20 VITALS — BP 145/70 | HR 70 | Temp 98.3°F | Resp 20

## 2019-08-20 DIAGNOSIS — Z7984 Long term (current) use of oral hypoglycemic drugs: Secondary | ICD-10-CM | POA: Diagnosis not present

## 2019-08-20 DIAGNOSIS — C7951 Secondary malignant neoplasm of bone: Secondary | ICD-10-CM | POA: Diagnosis present

## 2019-08-20 DIAGNOSIS — Z170 Estrogen receptor positive status [ER+]: Secondary | ICD-10-CM | POA: Insufficient documentation

## 2019-08-20 DIAGNOSIS — Z9013 Acquired absence of bilateral breasts and nipples: Secondary | ICD-10-CM | POA: Insufficient documentation

## 2019-08-20 DIAGNOSIS — C787 Secondary malignant neoplasm of liver and intrahepatic bile duct: Secondary | ICD-10-CM | POA: Diagnosis present

## 2019-08-20 DIAGNOSIS — C50911 Malignant neoplasm of unspecified site of right female breast: Secondary | ICD-10-CM | POA: Diagnosis present

## 2019-08-20 DIAGNOSIS — Z923 Personal history of irradiation: Secondary | ICD-10-CM | POA: Insufficient documentation

## 2019-08-20 DIAGNOSIS — Z79899 Other long term (current) drug therapy: Secondary | ICD-10-CM | POA: Insufficient documentation

## 2019-08-20 DIAGNOSIS — Z9221 Personal history of antineoplastic chemotherapy: Secondary | ICD-10-CM | POA: Diagnosis not present

## 2019-08-20 DIAGNOSIS — C782 Secondary malignant neoplasm of pleura: Secondary | ICD-10-CM | POA: Diagnosis not present

## 2019-08-20 DIAGNOSIS — C50919 Malignant neoplasm of unspecified site of unspecified female breast: Secondary | ICD-10-CM

## 2019-08-20 MED ORDER — FULVESTRANT 250 MG/5ML IM SOLN
INTRAMUSCULAR | Status: AC
  Filled 2019-08-20: qty 10

## 2019-08-20 MED ORDER — FULVESTRANT 250 MG/5ML IM SOLN
500.0000 mg | Freq: Once | INTRAMUSCULAR | Status: AC
  Administered 2019-08-20: 500 mg via INTRAMUSCULAR

## 2019-08-20 NOTE — Patient Instructions (Signed)
Fulvestrant injection What is this medicine? FULVESTRANT (ful VES trant) blocks the effects of estrogen. It is used to treat breast cancer. This medicine may be used for other purposes; ask your health care provider or pharmacist if you have questions. COMMON BRAND NAME(S): FASLODEX What should I tell my health care provider before I take this medicine? They need to know if you have any of these conditions:  bleeding disorders  liver disease  low blood counts, like low white cell, platelet, or red cell counts  an unusual or allergic reaction to fulvestrant, other medicines, foods, dyes, or preservatives  pregnant or trying to get pregnant  breast-feeding How should I use this medicine? This medicine is for injection into a muscle. It is usually given by a health care professional in a hospital or clinic setting. Talk to your pediatrician regarding the use of this medicine in children. Special care may be needed. Overdosage: If you think you have taken too much of this medicine contact a poison control center or emergency room at once. NOTE: This medicine is only for you. Do not share this medicine with others. What if I miss a dose? It is important not to miss your dose. Call your doctor or health care professional if you are unable to keep an appointment. What may interact with this medicine?  medicines that treat or prevent blood clots like warfarin, enoxaparin, dalteparin, apixaban, dabigatran, and rivaroxaban This list may not describe all possible interactions. Give your health care provider a list of all the medicines, herbs, non-prescription drugs, or dietary supplements you use. Also tell them if you smoke, drink alcohol, or use illegal drugs. Some items may interact with your medicine. What should I watch for while using this medicine? Your condition will be monitored carefully while you are receiving this medicine. You will need important blood work done while you are taking  this medicine. Do not become pregnant while taking this medicine or for at least 1 year after stopping it. Women of child-bearing potential will need to have a negative pregnancy test before starting this medicine. Women should inform their doctor if they wish to become pregnant or think they might be pregnant. There is a potential for serious side effects to an unborn child. Men should inform their doctors if they wish to father a child. This medicine may lower sperm counts. Talk to your health care professional or pharmacist for more information. Do not breast-feed an infant while taking this medicine or for 1 year after the last dose. What side effects may I notice from receiving this medicine? Side effects that you should report to your doctor or health care professional as soon as possible:  allergic reactions like skin rash, itching or hives, swelling of the face, lips, or tongue  feeling faint or lightheaded, falls  pain, tingling, numbness, or weakness in the legs  signs and symptoms of infection like fever or chills; cough; flu-like symptoms; sore throat  vaginal bleeding Side effects that usually do not require medical attention (report to your doctor or health care professional if they continue or are bothersome):  aches, pains  constipation  diarrhea  headache  hot flashes  nausea, vomiting  pain at site where injected  stomach pain This list may not describe all possible side effects. Call your doctor for medical advice about side effects. You may report side effects to FDA at 1-800-FDA-1088. Where should I keep my medicine? This drug is given in a hospital or clinic and will   not be stored at home. NOTE: This sheet is a summary. It may not cover all possible information. If you have questions about this medicine, talk to your doctor, pharmacist, or health care provider.  2020 Elsevier/Gold Standard (2017-08-10 11:34:41)  

## 2019-09-02 ENCOUNTER — Other Ambulatory Visit: Payer: Self-pay

## 2019-09-02 DIAGNOSIS — C50919 Malignant neoplasm of unspecified site of unspecified female breast: Secondary | ICD-10-CM

## 2019-09-02 NOTE — Progress Notes (Signed)
Patient Care Team: [name], MD as PCP - General (Family Medicine)  DIAGNOSIS:    ICD-10-CM   1. 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 BRCA reportedly normal.   08/25/2015 Relapse/Recurrence   new solitary liver lesion, biopsy showing metastatic ER PR + breast   09/25/2015 - 03/11/2016 Chemotherapy   Xeloda. Stopped due to progression of liver and bone metastases; pleural metastases   03/31/2016 - 07/19/2018 Anti-estrogen oral therapy   Foslodex started 03/31/16, currently monthly  Ibrance 164m 3 weeks on, 1 week off started 03/31/16 Xgeva started on 05/05/16 Letrozole daily added 08/01/17    03/20/2018 Genetic Testing   Genetic testing performed through [facility]'s Multi-Cancer reported out on 03/19/2018 showed no pathogenic mutations. The Multi-Cancer Panel offered by [facility] includes sequencing and/or deletion duplication testing of the following 91 genes: AIP, ALK, APC, ATM, AXIN2, BAP1, BARD1, BLM, BMPR1A, BRCA1, BRCA2, BRIP1, BUB1B, CASR, CDC73, CDH1, CDK4, CDKN1B, CDKN1C, CDKN2A, CEBPA, CEP57, CHEK2, CTNNA1, DICER1, DIS3L2, EGFR, ENG, EPCAM, FH, FLCN, GALNT12, GATA2, GPC3, GREM1, HOXB13, HRAS, KIT, MAX, MEN1, MET, MITF, MLH1, MLH3, MSH2, MSH3, MSH6, MUTYH, NBN, NF1, NF2, NTHL1, PALB2, PDGFRA, PHOX2B, PMS2, POLD1, POLE, POT1, PRKAR1A, PTCH1, PTEN, RAD50, RAD51C, RAD51D, RB1, RECQL4,  RET, RNF43, RPS20, RUNX1, SDHA, SDHAF2, SDHB, SDHC, SDHD, SMAD4, SMARCA4, SMARCB1, SMARCE1, STK11, SUFU, TERC, TERT, TMEM127, TP53, TSC1, TSC2, VHL, WRN, WT1  A variant of uncertain significance (VUS) in a gene called MET was also noted. c.[id number]>G (p.Thr557Ala)   04/08/2018 Miscellaneous   [facility] molecular testing: Androgen receptor positive, PDL 1 neg, MSI stable, NTRK1/2/3Neg, T MBB intermediate 9 mutations/Mb, ESR 1-, PI 3 CA negative, BRCA negative   07/20/2018 - 08/30/2018 Anti-estrogen oral therapy   Exemestane with everolimus.  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 with above-mentioned history of metastatic breast cancerwith metastases to thebone,liver,and lungswhois currently on treatment withtamoxifen and Faslodex.She presentsto the clinic todayfor follow-up.   ALLERGIES:  is allergic to prednisolone; prednisone; and sulfa antibiotics.  MEDICATIONS:  Current Outpatient Medications  Medication Sig Dispense Refill  . ACCU-CHEK AVIVA PLUS test strip     . Accu-Chek FastClix Lancets MISC     .  amiodarone (PACERONE) 200 MG tablet Take 1 tablet (200 mg total) by mouth daily. 90 tablet 3  .  atorvastatin (LIPITOR) 20 MG tablet Take 1 tablet (20 mg total) by mouth daily at 6 PM. 45 tablet 9  . Calcium Carb-Cholecalciferol (CALCIUM 600+D3) 600-800 MG-UNIT TABS Take 1 tablet by mouth 2 (two) times daily.     . Cholecalciferol (VITAMIN D3) 2000 UNITS TABS Take 2,000 mg by mouth daily.    . ferrous sulfate 325 (65 FE) MG tablet Take 325 mg by mouth daily as needed (energy).     . fish oil-omega-3 fatty acids 1000 MG capsule Take 1 g by mouth 2 (two) times daily.     . furosemide (LASIX) 20 MG tablet Take 1 tablet (20 mg total) by mouth daily. Reported on 05/07/2015 30 tablet 0  . gabapentin (NEURONTIN) 100 MG capsule Take 100 mg by mouth 2 (two) times daily.    . Ipratropium-Albuterol (COMBIVENT RESPIMAT) 20-100 MCG/ACT AERS respimat Inhale 1 puff into the lungs every 6 (six) hours. (Patient taking differently: Inhale 1 puff into the lungs every 6 (six) hours as needed for wheezing or shortness of breath. ) 4 g 6  . Magnesium Oxide (MAG-OXIDE) 200 MG TABS Take 1 tablet (200 mg total) by mouth daily. (Patient taking differently: Take 200 mg by mouth at bedtime. ) 30 tablet 0  . metFORMIN (GLUCOPHAGE) 500 MG tablet Take 2 tablets (1,000 mg total) by mouth 2 (two) times daily with a meal. 120 tablet 0  . metoprolol succinate (TOPROL-XL) 25 MG 24 hr tablet Take 1 tablet (25 mg total) by mouth daily. 90 tablet 3  . pantoprazole (PROTONIX) 40 MG tablet Take 1 tablet (40 mg total) by mouth 2 (two) times daily before a meal. 60 tablet 6  . sodium chloride (OCEAN) 0.65 % SOLN nasal spray Place 1 spray into both nostrils as needed for congestion.    . tamoxifen (NOLVADEX) 20 MG tablet Take 1 tablet (20 mg total) by mouth daily. 90 tablet 3   No current facility-administered medications for this visit.    PHYSICAL EXAMINATION: ECOG PERFORMANCE STATUS: 1 - Symptomatic but completely ambulatory  Vitals:   09/03/19 1048  BP: (!) 155/53  Pulse: (!) 52  Resp: 17  Temp: 98.5 F (36.9 C)  SpO2: 99%    Filed Weights   09/03/19 1048  Weight: 164 lb 4.8 oz (74.5 kg)    LABORATORY DATA:  I have reviewed the data as listed CMP Latest Ref Rng & Units 07/23/2019 07/08/2019 05/27/2019  Glucose 70 - 99 mg/dL 125(H) 137(H) 195(H)  BUN 8 - 23 mg/dL 25(H) 26(H) 28(H)  Creatinine 0.44 - 1.00 mg/dL 0.95 1.01(H) 1.32(H)  Sodium 135 - 145 mmol/L 140 143 141  Potassium 3.5 - 5.1 mmol/L 4.5 5.0 5.0  Chloride 98 - 111 mmol/L 101 101 101  CO2 22 - 32 mmol/L '28 28 28  ' Calcium 8.9 - 10.3 mg/dL 8.5(L) 8.9 8.2(L)  Total Protein 6.5 - 8.1 g/dL 6.9 6.9 6.9  Total Bilirubin 0.3 - 1.2 mg/dL 0.9 0.6 0.6  Alkaline Phos 38 - 126 U/L 57 72 67  AST 15 - 41 U/L 34 23 22  ALT 0 - 44 U/L '12 8 8    ' Lab Results  Component Value Date   WBC 6.2 09/03/2019   HGB 10.2 (L) 09/03/2019   HCT 32.6 (L) 09/03/2019   MCV 99.7 09/03/2019   PLT 187 09/03/2019   NEUTROABS 4.0 09/03/2019    ASSESSMENT &  PLAN:  Breast cancer metastasized to liver [facility]) Metastatic breast cancer: recently progressive in liver and bone, also pleura.  Faslodex + Ibrance beginning 03/31/2016-March 2020 stopped due to progression Exemestane with everolimus discontinued 08/29/2018 due to hyperglycemia Enzalutamide started 04/15/2019 discontinued 07/08/2019 for progression  [facility] molecular testing: Androgen receptor positive, PDL 1 neg, MSI stable, NTRK1/2/3Neg, T MBB intermediate 9 mutations/Mb, ESR 1-, PI 3 CA negative, BRCA negative (Xeloda discontinued 04/08/2019)  Hospitalization for severe anemia hemoglobin of 2.6 received 4 units of PRBC, today's hemoglobin is 10.2  CT abdomen pelvis 07/01/2019: Progressive pleuroparenchymal, hepatic, peritoneal and osseous metastatic disease.  Trace ascites.  Current treatment: Tamoxifen with Faslodex started 07/30/2019 Toxicities: Denies any adverse effects to tamoxifen. Denies any side effects to the Faslodex injection.  Soreness in the right abdomen area because she lifted her husband who fell  down.  It appears to be getting better.  Return to clinic in 2 months for follow-up.  Plan to do scans in 4 months    No orders of the defined types were placed in this encounter.  The patient has a good understanding of the overall plan. she agrees with it. she will call with any problems that may develop before the next visit here.  Total time spent: 30 mins including face to face time and time spent for planning, charting and coordination of care  Nicholas Lose, MD 09/03/2019  I, Cloyde Reams Dorshimer, am acting as scribe for Dr. Nicholas Lose.  I have reviewed the above documentation for accuracy and completeness, and I agree with the above.

## 2019-09-03 ENCOUNTER — Encounter: Payer: Self-pay | Admitting: Hematology and Oncology

## 2019-09-03 ENCOUNTER — Inpatient Hospital Stay: Payer: Medicare Other

## 2019-09-03 ENCOUNTER — Inpatient Hospital Stay (HOSPITAL_BASED_OUTPATIENT_CLINIC_OR_DEPARTMENT_OTHER): Payer: Medicare Other | Admitting: Hematology and Oncology

## 2019-09-03 ENCOUNTER — Other Ambulatory Visit: Payer: Self-pay

## 2019-09-03 DIAGNOSIS — C787 Secondary malignant neoplasm of liver and intrahepatic bile duct: Secondary | ICD-10-CM

## 2019-09-03 DIAGNOSIS — C50911 Malignant neoplasm of unspecified site of right female breast: Secondary | ICD-10-CM | POA: Diagnosis not present

## 2019-09-03 DIAGNOSIS — C7951 Secondary malignant neoplasm of bone: Secondary | ICD-10-CM

## 2019-09-03 DIAGNOSIS — Z95828 Presence of other vascular implants and grafts: Secondary | ICD-10-CM | POA: Insufficient documentation

## 2019-09-03 DIAGNOSIS — C50919 Malignant neoplasm of unspecified site of unspecified female breast: Secondary | ICD-10-CM

## 2019-09-03 LAB — CBC WITH DIFFERENTIAL (CANCER CENTER ONLY)
Abs Immature Granulocytes: 0.01 10*3/uL (ref 0.00–0.07)
Basophils Absolute: 0 10*3/uL (ref 0.0–0.1)
Basophils Relative: 1 %
Eosinophils Absolute: 0.1 10*3/uL (ref 0.0–0.5)
Eosinophils Relative: 2 %
HCT: 32.6 % — ABNORMAL LOW (ref 36.0–46.0)
Hemoglobin: 10.2 g/dL — ABNORMAL LOW (ref 12.0–15.0)
Immature Granulocytes: 0 %
Lymphocytes Relative: 24 %
Lymphs Abs: 1.5 10*3/uL (ref 0.7–4.0)
MCH: 31.2 pg (ref 26.0–34.0)
MCHC: 31.3 g/dL (ref 30.0–36.0)
MCV: 99.7 fL (ref 80.0–100.0)
Monocytes Absolute: 0.5 10*3/uL (ref 0.1–1.0)
Monocytes Relative: 8 %
Neutro Abs: 4 10*3/uL (ref 1.7–7.7)
Neutrophils Relative %: 65 %
Platelet Count: 187 10*3/uL (ref 150–400)
RBC: 3.27 MIL/uL — ABNORMAL LOW (ref 3.87–5.11)
RDW: 16.5 % — ABNORMAL HIGH (ref 11.5–15.5)
WBC Count: 6.2 10*3/uL (ref 4.0–10.5)
nRBC: 0 % (ref 0.0–0.2)

## 2019-09-03 LAB — CMP (CANCER CENTER ONLY)
ALT: 7 U/L (ref 0–44)
AST: 25 U/L (ref 15–41)
Albumin: 3.1 g/dL — ABNORMAL LOW (ref 3.5–5.0)
Alkaline Phosphatase: 61 U/L (ref 38–126)
Anion gap: 12 (ref 5–15)
BUN: 23 mg/dL (ref 8–23)
CO2: 26 mmol/L (ref 22–32)
Calcium: 8.4 mg/dL — ABNORMAL LOW (ref 8.9–10.3)
Chloride: 103 mmol/L (ref 98–111)
Creatinine: 1.04 mg/dL — ABNORMAL HIGH (ref 0.44–1.00)
GFR, Est AFR Am: >60 mL/min (ref >60)
GFR, Estimated: 52 mL/min — ABNORMAL LOW (ref >60)
Glucose, Bld: 120 mg/dL — ABNORMAL HIGH (ref 70–99)
Potassium: 4.6 mmol/L (ref 3.5–5.1)
Sodium: 141 mmol/L (ref 135–145)
Total Bilirubin: 0.7 mg/dL (ref 0.3–1.2)
Total Protein: 6.5 g/dL (ref 6.5–8.1)

## 2019-09-03 MED ORDER — FULVESTRANT 250 MG/5ML IM SOLN
500.0000 mg | Freq: Once | INTRAMUSCULAR | Status: AC
  Administered 2019-09-03: 500 mg via INTRAMUSCULAR

## 2019-09-03 MED ORDER — SODIUM CHLORIDE 0.9% FLUSH
10.0000 mL | INTRAVENOUS | Status: DC | PRN
  Filled 2019-09-03: qty 10

## 2019-09-03 MED ORDER — HEPARIN SOD (PORK) LOCK FLUSH 100 UNIT/ML IV SOLN
500.0000 [IU] | Freq: Once | INTRAVENOUS | Status: DC | PRN
  Filled 2019-09-03: qty 5

## 2019-09-03 MED ORDER — FULVESTRANT 250 MG/5ML IM SOLN
INTRAMUSCULAR | Status: AC
  Filled 2019-09-03: qty 10

## 2019-09-03 NOTE — Patient Instructions (Signed)
Fulvestrant injection What is this medicine? FULVESTRANT (ful VES trant) blocks the effects of estrogen. It is used to treat breast cancer. This medicine may be used for other purposes; ask your health care provider or pharmacist if you have questions. COMMON BRAND NAME(S): FASLODEX What should I tell my health care provider before I take this medicine? They need to know if you have any of these conditions:  bleeding disorders  liver disease  low blood counts, like low white cell, platelet, or red cell counts  an unusual or allergic reaction to fulvestrant, other medicines, foods, dyes, or preservatives  pregnant or trying to get pregnant  breast-feeding How should I use this medicine? This medicine is for injection into a muscle. It is usually given by a health care professional in a hospital or clinic setting. Talk to your pediatrician regarding the use of this medicine in children. Special care may be needed. Overdosage: If you think you have taken too much of this medicine contact a poison control center or emergency room at once. NOTE: This medicine is only for you. Do not share this medicine with others. What if I miss a dose? It is important not to miss your dose. Call your doctor or health care professional if you are unable to keep an appointment. What may interact with this medicine?  medicines that treat or prevent blood clots like warfarin, enoxaparin, dalteparin, apixaban, dabigatran, and rivaroxaban This list may not describe all possible interactions. Give your health care provider a list of all the medicines, herbs, non-prescription drugs, or dietary supplements you use. Also tell them if you smoke, drink alcohol, or use illegal drugs. Some items may interact with your medicine. What should I watch for while using this medicine? Your condition will be monitored carefully while you are receiving this medicine. You will need important blood work done while you are taking  this medicine. Do not become pregnant while taking this medicine or for at least 1 year after stopping it. Women of child-bearing potential will need to have a negative pregnancy test before starting this medicine. Women should inform their doctor if they wish to become pregnant or think they might be pregnant. There is a potential for serious side effects to an unborn child. Men should inform their doctors if they wish to father a child. This medicine may lower sperm counts. Talk to your health care professional or pharmacist for more information. Do not breast-feed an infant while taking this medicine or for 1 year after the last dose. What side effects may I notice from receiving this medicine? Side effects that you should report to your doctor or health care professional as soon as possible:  allergic reactions like skin rash, itching or hives, swelling of the face, lips, or tongue  feeling faint or lightheaded, falls  pain, tingling, numbness, or weakness in the legs  signs and symptoms of infection like fever or chills; cough; flu-like symptoms; sore throat  vaginal bleeding Side effects that usually do not require medical attention (report to your doctor or health care professional if they continue or are bothersome):  aches, pains  constipation  diarrhea  headache  hot flashes  nausea, vomiting  pain at site where injected  stomach pain This list may not describe all possible side effects. Call your doctor for medical advice about side effects. You may report side effects to FDA at 1-800-FDA-1088. Where should I keep my medicine? This drug is given in a hospital or clinic and will   not be stored at home. NOTE: This sheet is a summary. It may not cover all possible information. If you have questions about this medicine, talk to your doctor, pharmacist, or health care provider.  2020 Elsevier/Gold Standard (2017-08-10 11:34:41)  

## 2019-09-03 NOTE — Assessment & Plan Note (Signed)
Metastatic breast cancer: recently progressive in liver and bone, also pleura.  Faslodex + Ibrance beginning 03/31/2016-March 2020 stopped due to progression Exemestane with everolimus discontinued 08/29/2018 due to hyperglycemia Enzalutamide started 04/15/2019 discontinued 07/08/2019 for progression  [facility] molecular testing: Androgen receptor positive, PDL 1 neg, MSI stable, NTRK1/2/3Neg, T MBB intermediate 9 mutations/Mb, ESR 1-, PI 3 CA negative, BRCA negative (Xeloda discontinued 04/08/2019)  Hospitalization for severe anemia hemoglobin of 2.6 received 4 units of PRBC  CT CAP: 04/04/2019: Worsening liver metastatic disease with increase in number and size of the liver masses. Left lobe: 5.6 cm previously was 2.6 cm. Right lobe 4.1 cm previously 4 cm, 2.8 cm was 0.8 cm. Stable bone mets  Current treatment: Tamoxifen with Faslodex started 07/30/2019 Toxicities:  Return to clinic in 2 months with scans and follow-up

## 2019-09-05 ENCOUNTER — Telehealth: Payer: Self-pay | Admitting: Hematology and Oncology

## 2019-09-05 NOTE — Telephone Encounter (Signed)
Scheduled per 04/20 los, patient has been called and voicemail was left.

## 2019-09-23 ENCOUNTER — Ambulatory Visit (INDEPENDENT_AMBULATORY_CARE_PROVIDER_SITE_OTHER): Payer: Medicare Other | Admitting: Ophthalmology

## 2019-09-23 ENCOUNTER — Encounter (INDEPENDENT_AMBULATORY_CARE_PROVIDER_SITE_OTHER): Payer: Self-pay | Admitting: Ophthalmology

## 2019-09-23 ENCOUNTER — Other Ambulatory Visit: Payer: Self-pay

## 2019-09-23 DIAGNOSIS — H34832 Tributary (branch) retinal vein occlusion, left eye, with macular edema: Secondary | ICD-10-CM

## 2019-09-23 DIAGNOSIS — H43822 Vitreomacular adhesion, left eye: Secondary | ICD-10-CM

## 2019-09-23 DIAGNOSIS — E113312 Type 2 diabetes mellitus with moderate nonproliferative diabetic retinopathy with macular edema, left eye: Secondary | ICD-10-CM

## 2019-09-23 MED ORDER — BEVACIZUMAB CHEMO INJECTION 1.25MG/0.05ML SYRINGE FOR KALEIDOSCOPE
1.2500 mg | INTRAVITREAL | Status: AC | PRN
  Administered 2019-09-23: 1.25 mg via INTRAVITREAL

## 2019-09-23 NOTE — Progress Notes (Signed)
09/23/2019     CHIEF COMPLAINT Patient presents for Retina Follow Up   HISTORY OF PRESENT ILLNESS: Brittany Porter is a 74 y.o. female who presents to the clinic today for:   HPI    Retina Follow Up    Patient presents with  CRVO/BRVO.  In left eye.  Duration of 6 weeks.  Since onset it is stable.          Comments    6 week follow up - OCT OU, Possible Avastin OS Patient states some days she can see better than others. No other complaints. LBS 105 yesterday A1C 6.19 July 2019       Last edited by Gerda Diss on 09/23/2019 10:51 AM. (History)      Referring physician: [name], MD [address] [place],  [place] [address]  HISTORICAL INFORMATION:   Selected notes from the MEDICAL RECORD NUMBER    Lab Results  Component Value Date   HGBA1C 6.5 (H) 04/29/2019     CURRENT MEDICATIONS: No current outpatient medications on file. (Ophthalmic Drugs)   No current facility-administered medications for this visit. (Ophthalmic Drugs)   Current Outpatient Medications (Other)  Medication Sig  . ACCU-CHEK AVIVA PLUS test strip   . Accu-Chek FastClix Lancets MISC   . amiodarone (PACERONE) 200 MG tablet Take 1 tablet (200 mg total) by mouth daily.  Marland Kitchen atorvastatin (LIPITOR) 20 MG tablet Take 1 tablet (20 mg total) by mouth daily at 6 PM.  . Calcium Carb-Cholecalciferol (CALCIUM 600+D3) 600-800 MG-UNIT TABS Take 1 tablet by mouth 2 (two) times daily.   . Cholecalciferol (VITAMIN D3) 2000 UNITS TABS Take 2,000 mg by mouth daily.  . ferrous sulfate 325 (65 FE) MG tablet Take 325 mg by mouth daily as needed (energy).   . fish oil-omega-3 fatty acids 1000 MG capsule Take 1 g by mouth 2 (two) times daily.   . furosemide (LASIX) 20 MG tablet Take 1 tablet (20 mg total) by mouth daily. Reported on 05/07/2015  . gabapentin (NEURONTIN) 100 MG capsule Take 100 mg by mouth 2 (two) times daily.  . Ipratropium-Albuterol (COMBIVENT RESPIMAT) 20-100 MCG/ACT AERS respimat  Inhale 1 puff into the lungs every 6 (six) hours. (Patient taking differently: Inhale 1 puff into the lungs every 6 (six) hours as needed for wheezing or shortness of breath. )  . Magnesium Oxide (MAG-OXIDE) 200 MG TABS Take 1 tablet (200 mg total) by mouth daily. (Patient taking differently: Take 200 mg by mouth at bedtime. )  . metFORMIN (GLUCOPHAGE) 500 MG tablet Take 2 tablets (1,000 mg total) by mouth 2 (two) times daily with a meal.  . metoprolol succinate (TOPROL-XL) 25 MG 24 hr tablet Take 1 tablet (25 mg total) by mouth daily.  . pantoprazole (PROTONIX) 40 MG tablet Take 1 tablet (40 mg total) by mouth 2 (two) times daily before a meal.  . sodium chloride (OCEAN) 0.65 % SOLN nasal spray Place 1 spray into both nostrils as needed for congestion.  . tamoxifen (NOLVADEX) 20 MG tablet Take 1 tablet (20 mg total) by mouth daily.   No current facility-administered medications for this visit. (Other)      REVIEW OF SYSTEMS:    ALLERGIES Allergies  Allergen Reactions  . Prednisolone Shortness Of Breath and Other (See Comments)     increased BP  . Prednisone Anaphylaxis  . Sulfa Antibiotics Swelling    "Eyes swelled shut" - reaction to eye drops    PAST MEDICAL  HISTORY Past Medical History:  Diagnosis Date  . Arthritis   . Breast cancer ([place])   . Diabetes mellitus   . Endometrial carcinoma ([place]) 10/2008  . Family history of breast cancer   . Family history of colon cancer   . Family history of skin cancer   . Fluid overload   . History of radiation therapy 04/13/2009, 04/16/2009, 04/27/2009, 05/07/2009, 05/18/2009   3000 cGy to proximal vagina  . Hyperlipidemia   . Iron deficiency   . Melanoma ([place]) 2010   rt arm and back  . Ovarian cancer ([place])   . Pleural effusion 04/2010   Past Surgical History:  Procedure Laterality Date  . ABDOMINAL HYSTERECTOMY  10/2008  . APPENDECTOMY    . CATARACT EXTRACTION Right   . CATARACT EXTRACTION Left   . ESOPHAGOGASTRODUODENOSCOPY  (EGD) WITH PROPOFOL N/A 12/30/2018   Procedure: ESOPHAGOGASTRODUODENOSCOPY (EGD) WITH PROPOFOL;  Surgeon: Ronald Lobo, MD;  Location: [place];  Service: Endoscopy;  Laterality: N/A;  . INSERTION OF MESH  05/28/2012   Procedure: INSERTION OF MESH;  Surgeon: Adin Hector, MD;  Location: [facility];  Service: General;  Laterality: N/A;  . IR IMAGING GUIDED PORT INSERTION  07/23/2019  . IR US GUIDE BX ASP/DRAIN  07/23/2019  . KNEE ARTHROSCOPY  04/2010   Right knee  . LAPAROSCOPIC LYSIS OF ADHESIONS  05/28/2012   Procedure: LAPAROSCOPIC LYSIS OF ADHESIONS;  Surgeon: Adin Hector, MD;  Location: [facility];  Service: General;  Laterality: N/A;  . MASTECTOMY  1997   Bilateral with lymph nodes  . Melanoma removal  02/2009, 07/2009  . VENTRAL HERNIA REPAIR  05/28/2012   Procedure: LAPAROSCOPIC VENTRAL HERNIA;  Surgeon: Adin Hector, MD;  Location: [facility] [place];  Service: General;  Laterality: N/A;    FAMILY HISTORY Family History  Problem Relation Age of Onset  . Stroke Mother   . Cancer Father        'voice box'  . Colon cancer Sister        dx >50  . Heart attack Brother   . Skin cancer Brother 7  . Cancer Sister        primary site unk, dx >50  . Diabetes Brother   . Stroke Brother   . Stroke Sister   . ALS Sister   . Colon cancer Sister 67  . Dementia Sister   . Breast cancer Sister        dx >5-, met to brain    SOCIAL HISTORY Social History   Tobacco Use  . Smoking status: Never Smoker  . Smokeless tobacco: Never Used  Substance Use Topics  . Alcohol use: No  . Drug use: No         OPHTHALMIC EXAM: Base Eye Exam    Visual Acuity (Snellen - Linear)      Right Left   Dist [place] 20/20-2 20/30-1   Dist ph [place]  NI       Tonometry (Tonopen, 10:56 AM)      Right Left   Pressure 10 13       Pupils      Pupils Dark Light Shape React APD   Right PERRL 4 3 Round Sluggish None   Left PERRL 3 2 Round Sluggish None       Visual Fields (Counting fingers)      Left Right     Full Full       Extraocular Movement      Right Left  Full Full       Neuro/Psych    Oriented x3: Yes   Mood/Affect: Normal       Dilation    Left eye: 1.0% Mydriacyl, 2.5% Phenylephrine @ 10:56 AM        Slit Lamp and Fundus Exam    External Exam      Right Left   External Normal Normal       Slit Lamp Exam      Right Left   Lids/Lashes Normal Normal   Conjunctiva/Sclera White and quiet White and quiet   Cornea Clear Clear   Anterior Chamber Deep and quiet Deep and quiet   Iris Round and reactive Round and reactive   Lens Posterior chamber intraocular lens Posterior chamber intraocular lens   Anterior Vitreous Normal Normal       Fundus Exam      Right Left   Posterior Vitreous  Normal   Disc  Normal   C/D Ratio  0.4   Macula  Microaneurysms, no macular thickening   Vessels  Old twig macular branch retinal vein occlusion   Periphery  Normal          IMAGING AND PROCEDURES  Imaging and Procedures for 09/23/19  OCT, Retina - OU - Both Eyes       Right Eye Quality was good. Scan locations included subfoveal. Central Foveal Thickness: 313. Progression has been stable. Findings include normal observations, retinal drusen .   Left Eye Quality was good. Scan locations included subfoveal. Central Foveal Thickness: 499. Progression has worsened. Findings include abnormal foveal contour, cystoid macular edema, vitreomacular adhesion .   Notes OS with CME inferior to the foveal avascular zone macular branch retinal vein occlusion, twig.  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  The patient was given access to printed information.  The treatment options including continued observation looking for spontaneous resolution versus grid laser versus intravitreal Kenalog injection  were discussed.  PRIMARY THERAPY CONSISTS of Anti-VEGF Therapies, AVASTIN, LUCENTIS AND EYLEA.  Their usage was discussed to assist in halting the progression of Macular Edema, in order to preserve, protect or improve acuity.  Additionally, at times, limited focal laser therapy is used in the management.  The risks and benefits of all these options were discussed with the patient.  The patient's questions were answered.      ICD-10-CM   1. 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 Vitreomacular adhesion of left eye  H43.822     1.  Intravitreal Avastin delivered OS today for twig macular branch retinal vein occlusion and CME inferior to the foveal avascular zone repeat examination left eye in 6 weeks  2.  3.  Ophthalmic Meds Ordered this visit:  No orders of the defined types were placed in this encounter.      No follow-ups on file.  There are no Patient Instructions on file for this visit.   Explained the diagnoses, plan, and follow up with the patient and they expressed understanding.  Patient expressed understanding of the importance of proper follow up care.   Clent Demark  M.D. Diseases & Surgery of the Retina and Vitreous [facility]ina [facility] [place] 09/23/19  Abbreviations: M myopia (nearsighted); A astigmatism; H hyperopia (farsighted); P presbyopia; Mrx spectacle prescription;  CTL contact lenses; OD right eye; OS left eye; OU both eyes  XT exotropia; ET esotropia; PEK punctate epithelial keratitis; PEE punctate epithelial erosions; DES dry eye syndrome; MGD meibomian gland dysfunction; ATs artificial tears; PFAT's preservative free artificial tears; [place] nuclear sclerotic cataract; PSC posterior subcapsular cataract; ERM epi-retinal membrane; PVD posterior vitreous detachment; RD retinal detachment; DM diabetes mellitus; DR diabetic retinopathy; NPDR non-proliferative diabetic retinopathy; PDR  proliferative diabetic retinopathy; CSME clinically significant macular edema; DME diabetic macular edema; dbh dot blot hemorrhages; CWS cotton wool spot; POAG primary open angle glaucoma; C/D cup-to-disc ratio; HVF humphrey visual field; GVF goldmann visual field; OCT optical coherence tomography; IOP intraocular pressure; BRVO Branch retinal vein occlusion; CRVO central retinal vein occlusion; CRAO central retinal artery occlusion; BRAO branch retinal artery occlusion; RT retinal tear; SB scleral buckle; PPV pars plana vitrectomy; VH Vitreous hemorrhage; PRP panretinal laser photocoagulation; IVK intravitreal kenalog; VMT vitreomacular traction; MH Macular hole;  NVD neovascularization of the disc; NVE neovascularization elsewhere; AREDS age related eye disease study; ARMD age related macular degeneration; POAG primary open angle glaucoma; EBMD epithelial/anterior basement membrane dystrophy; ACIOL anterior chamber intraocular lens; IOL intraocular lens; PCIOL posterior chamber intraocular lens; Phaco/IOL phacoemulsification with intraocular lens placement; [place] photorefractive keratectomy; LASIK laser assisted in situ keratomileusis; HTN hypertension; DM diabetes mellitus; COPD chronic obstructive pulmonary disease

## 2019-09-23 NOTE — Assessment & Plan Note (Signed)
The nature of branch retinal vein occlusion with macular edema was discussed.  The patient was given access to printed information.  The treatment options including continued observation looking for spontaneous resolution versus grid laser versus intravitreal Kenalog injection were discussed.  PRIMARY THERAPY CONSISTS of Anti-VEGF Therapies, AVASTIN, LUCENTIS AND EYLEA.  Their usage was discussed to assist in halting the progression of Macular Edema, in order to preserve, protect or improve acuity.  Additionally, at times, limited focal laser therapy is used in the management.  The risks and benefits of all these options were discussed with the patient.  The patient's questions were answered. 

## 2019-09-30 NOTE — Progress Notes (Signed)
Patient Care Team: [name], MD as PCP - General (Family Medicine)  DIAGNOSIS:    ICD-10-CM   1. 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 BRCA reportedly normal.   08/25/2015 Relapse/Recurrence   new solitary liver lesion, biopsy showing metastatic ER PR + breast   09/25/2015 - 03/11/2016 Chemotherapy   Xeloda. Stopped due to progression of liver and bone metastases; pleural metastases   03/31/2016 - 07/19/2018 Anti-estrogen oral therapy   Foslodex started 03/31/16, currently monthly  Ibrance '125mg'$  3 weeks on, 1 week off started 03/31/16 Xgeva started on 05/05/16 Letrozole daily added 08/01/17    03/20/2018 Genetic Testing   Genetic testing performed through [facility]'s Multi-Cancer reported out on 03/19/2018 showed no pathogenic mutations. The Multi-Cancer Panel offered by [facility] includes sequencing and/or deletion duplication testing of the following 91 genes: AIP, ALK, APC, ATM, AXIN2, BAP1, BARD1, BLM, BMPR1A, BRCA1, BRCA2, BRIP1, BUB1B, CASR, CDC73, CDH1, CDK4, CDKN1B, CDKN1C, CDKN2A, CEBPA, CEP57, CHEK2, CTNNA1, DICER1, DIS3L2, EGFR, ENG, EPCAM, FH, FLCN, GALNT12, GATA2, GPC3, GREM1, HOXB13, HRAS, KIT, MAX, MEN1, MET, MITF, MLH1, MLH3, MSH2, MSH3, MSH6, MUTYH, NBN,  NF1, NF2, NTHL1, PALB2, PDGFRA, PHOX2B, PMS2, POLD1, POLE, POT1, PRKAR1A, PTCH1, PTEN, RAD50, RAD51C, RAD51D, RB1, RECQL4, RET, RNF43, RPS20, RUNX1, SDHA, SDHAF2, SDHB, SDHC, SDHD, SMAD4, SMARCA4, SMARCB1, SMARCE1, STK11, SUFU, TERC, TERT, TMEM127, TP53, TSC1, TSC2, VHL, WRN, WT1  A variant of uncertain significance (VUS) in a gene called MET was also noted. c.[id number] (p.Thr557Ala)   04/08/2018 Miscellaneous   [facility] molecular testing: Androgen receptor positive, PDL 1 neg, MSI stable, NTRK1/2/3Neg, T MBB intermediate 9 mutations/Mb, ESR 1-, PI 3 CA negative, BRCA negative   07/20/2018 - 08/30/2018 Anti-estrogen oral therapy   Exemestane with everolimus.  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   Carcinoma of breast metastatic to bone ([place])  03/27/2016 Initial Diagnosis   Carcinoma of breast metastatic to bone ([place])   08/03/2017 - 01/31/2018 Chemotherapy   Foslodex started 03/31/16 monthly  Ibrance '125mg'$  3 weeks on, 1 week off started 03/31/16 Daily letrozole added 08/01/17    Breast cancer metastasized to liver, unspecified laterality ([place]) (Resolved)  03/27/2016 Initial Diagnosis   Breast cancer metastasized to liver, unspecified laterality ([place])     CHIEF COMPLIANT: Follow-up of metastatic breast cancer  INTERVAL HISTORY: Brittany Porter is a 74 y.o. with above-mentioned history of metastatic breast cancerwith metastases to thebone,liver,and lungswhois currently on treatment withtamoxifen and Faslodex.She presentsto the clinic todayfor follow-up.   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  She is tolerating tamoxifen extremely well without any side effects.  She does not have any problems tolerating Faslodex  either.  ALLERGIES:  is allergic to prednisolone; prednisone; and sulfa antibiotics.  MEDICATIONS:  Current Outpatient Medications  Medication Sig Dispense Refill  . ACCU-CHEK AVIVA PLUS test strip     . Accu-Chek FastClix Lancets MISC     . amiodarone (PACERONE) 200 MG tablet Take 1 tablet (200 mg total) by mouth daily. 90 tablet 3  . atorvastatin (LIPITOR) 20 MG tablet Take 1 tablet (20 mg total) by mouth daily at 6 PM. 45 tablet 9  . Calcium Carb-Cholecalciferol (CALCIUM 600+D3) 600-800 MG-UNIT TABS Take 1 tablet by mouth 2 (two) times daily.     . Cholecalciferol (VITAMIN D3) 2000 UNITS TABS Take 2,000 mg by mouth daily.    . ferrous sulfate 325 (65 FE) MG tablet Take 325 mg by mouth daily as needed (energy).     . fish oil-omega-3 fatty acids 1000 MG capsule Take 1 g by mouth 2 (two) times daily.     . furosemide (LASIX) 20 MG tablet Take 1 tablet (20 mg total) by mouth daily. Reported on 05/07/2015 30 tablet 0  . gabapentin (NEURONTIN) 100 MG capsule Take 100 mg by mouth 2 (two) times daily.    . Ipratropium-Albuterol (COMBIVENT RESPIMAT) 20-100 MCG/ACT AERS respimat Inhale 1 puff into the lungs every 6 (six) hours. (Patient taking differently: Inhale 1 puff into the lungs every 6 (six) hours as needed for wheezing or shortness of breath. ) 4 g 6  . Magnesium Oxide (MAG-OXIDE) 200 MG TABS Take 1 tablet (200 mg total) by mouth daily. (Patient taking differently: Take 200 mg by mouth at bedtime. ) 30 tablet 0  . metFORMIN (GLUCOPHAGE) 500 MG tablet Take 2 tablets (1,000 mg total) by mouth 2 (two) times daily with a meal. 120 tablet 0  . metoprolol succinate (TOPROL-XL) 25 MG 24 hr tablet Take 1 tablet (25 mg total) by mouth daily. 90 tablet 3  . pantoprazole (PROTONIX) 40 MG tablet Take 1 tablet (40 mg total) by mouth 2 (two) times daily before a meal. 60 tablet 6  . sodium chloride (OCEAN) 0.65 % SOLN nasal spray Place 1 spray into both nostrils as needed for congestion.    . tamoxifen  (NOLVADEX) 20 MG tablet Take 1 tablet (20 mg total) by mouth daily. 90 tablet 3   No current facility-administered medications for this visit.    PHYSICAL EXAMINATION: ECOG PERFORMANCE STATUS: 2 - Symptomatic, <50% confined to bed  Vitals:   10/01/19 1130  BP: 126/65  Pulse: 70  Resp: 17  Temp: 98.7 F (37.1 C)  SpO2: 100%   Filed Weights   10/01/19 1130  Weight: 163 lb (73.9 kg)    LABORATORY DATA:  I have reviewed the data as listed CMP Latest Ref Rng & Units 09/03/2019 07/23/2019 07/08/2019  Glucose 70 - 99 mg/dL 120(H) 125(H) 137(H)  BUN 8 - 23 mg/dL 23 25(H) 26(H)  Creatinine 0.44 - 1.00 mg/dL 1.04(H) 0.95 1.01(H)  Sodium 135 - 145 mmol/L 141 140 143  Potassium 3.5 - 5.1 mmol/L 4.6 4.5 5.0  Chloride 98 - 111 mmol/L 103 101 101  CO2 22 - 32 mmol/L '26 28 28  '$ Calcium 8.9 - 10.3 mg/dL 8.4(L) 8.5(L) 8.9  Total Protein 6.5 - 8.1 g/dL 6.5 6.9 6.9  Total Bilirubin 0.3 - 1.2 mg/dL 0.7 0.9 0.6  Alkaline Phos 38 - 126 U/L 61 57 72  AST 15 - 41 U/L 25 34 23  ALT 0 - 44 U/L '7 12 8    '$ Lab Results  Component Value Date   WBC 5.3 10/01/2019   HGB 10.2 (L) 10/01/2019   HCT 33.1 (L) 10/01/2019   MCV 96.8 10/01/2019   PLT 200 10/01/2019   NEUTROABS 3.5 10/01/2019    ASSESSMENT & PLAN:  Breast cancer metastasized to liver [facility]) Metastatic breast cancer: recently progressive in liver and bone, also pleura.  Faslodex + Ibrance beginning 03/31/2016-March 2020 stopped due to progression Exemestane with everolimus discontinued 08/29/2018 due to hyperglycemia Enzalutamide started 04/15/2019 discontinued 07/08/2019 for progression  [facility] molecular testing: Androgen receptor positive, PDL 1 neg, MSI stable, NTRK1/2/3Neg, T MBB intermediate 9 mutations/Mb, ESR 1-, PI 3 CA negative, BRCA negative (Xeloda discontinued 04/08/2019)  Hospitalization for severe anemia hemoglobin of 2.6 received 4 units of PRBC, today's hemoglobin is 10.2  CT abdomen pelvis 07/01/2019: Progressive  pleuroparenchymal, hepatic, peritoneal and osseous metastatic disease.  Trace ascites.  Current treatment: Tamoxifen with Faslodex started 07/30/2019 Toxicities: Denies any adverse effects to tamoxifen. Denies any side effects to the Faslodex injection.  Return to clinic in 2 months  with scans and follow-up    Orders Placed This Encounter  Procedures  . CT Abdomen Pelvis W Contrast    Standing Status:   Future    Standing Expiration Date:   09/30/2020    Order Specific Question:   ** REASON FOR EXAM (FREE TEXT)    Answer:   Metastatic breast cancer restaging    Order Specific Question:   If indicated for the ordered procedure, I authorize the administration of contrast media per Radiology protocol    Answer:   Yes    Order Specific Question:   Preferred imaging location?    Answer:   [facility]    Order Specific Question:   Is Oral Contrast requested for this exam?    Answer:   Yes, Per Radiology protocol    Order Specific Question:   Radiology Contrast Protocol - do NOT remove file path    Answer:   \\[email or web address].pdf  . CT Chest W Contrast    Standing Status:   Future    Standing Expiration Date:   09/30/2020    Order Specific Question:   ** REASON FOR EXAM (FREE TEXT)    Answer:   Metastatic breast cancer restaging    Order Specific Question:   If indicated for the ordered procedure, I authorize the administration of contrast media per Radiology protocol    Answer:   Yes    Order Specific Question:   Preferred imaging location?    Answer:   [facility]    Order Specific Question:   Radiology Contrast Protocol - do NOT remove file path    Answer:   \\[email or web address].pdf  . CBC with Differential ([place] Only)    Standing Status:   Future    Standing Expiration Date:   09/30/2020  . CMP ([place] only)    Standing Status:   Future    Standing Expiration Date:   09/30/2020   The patient has a good  understanding of the overall plan. she agrees with it. she will call with any problems that may develop before the next visit here.  Total time spent: 30 mins including face to face time and time spent for planning, charting and coordination of care  Nicholas Lose, MD 10/01/2019  I, Cloyde Reams Dorshimer, am acting as scribe for Dr.  Nicholas Lose.  I have reviewed the above documentation for accuracy and completeness, and I agree with the above.

## 2019-10-01 ENCOUNTER — Inpatient Hospital Stay: Payer: Medicare Other | Attending: Hematology and Oncology

## 2019-10-01 ENCOUNTER — Inpatient Hospital Stay: Payer: Medicare Other | Admitting: Hematology and Oncology

## 2019-10-01 ENCOUNTER — Inpatient Hospital Stay: Payer: Medicare Other

## 2019-10-01 ENCOUNTER — Other Ambulatory Visit: Payer: Self-pay

## 2019-10-01 DIAGNOSIS — Z5111 Encounter for antineoplastic chemotherapy: Secondary | ICD-10-CM | POA: Insufficient documentation

## 2019-10-01 DIAGNOSIS — C7951 Secondary malignant neoplasm of bone: Secondary | ICD-10-CM | POA: Insufficient documentation

## 2019-10-01 DIAGNOSIS — C787 Secondary malignant neoplasm of liver and intrahepatic bile duct: Secondary | ICD-10-CM | POA: Insufficient documentation

## 2019-10-01 DIAGNOSIS — C786 Secondary malignant neoplasm of retroperitoneum and peritoneum: Secondary | ICD-10-CM | POA: Insufficient documentation

## 2019-10-01 DIAGNOSIS — Z170 Estrogen receptor positive status [ER+]: Secondary | ICD-10-CM | POA: Insufficient documentation

## 2019-10-01 DIAGNOSIS — C50919 Malignant neoplasm of unspecified site of unspecified female breast: Secondary | ICD-10-CM

## 2019-10-01 DIAGNOSIS — Z95828 Presence of other vascular implants and grafts: Secondary | ICD-10-CM

## 2019-10-01 DIAGNOSIS — Z7984 Long term (current) use of oral hypoglycemic drugs: Secondary | ICD-10-CM | POA: Diagnosis not present

## 2019-10-01 DIAGNOSIS — Z9013 Acquired absence of bilateral breasts and nipples: Secondary | ICD-10-CM | POA: Diagnosis not present

## 2019-10-01 DIAGNOSIS — C50911 Malignant neoplasm of unspecified site of right female breast: Secondary | ICD-10-CM | POA: Insufficient documentation

## 2019-10-01 DIAGNOSIS — D649 Anemia, unspecified: Secondary | ICD-10-CM | POA: Insufficient documentation

## 2019-10-01 DIAGNOSIS — Z79899 Other long term (current) drug therapy: Secondary | ICD-10-CM | POA: Insufficient documentation

## 2019-10-01 LAB — CMP (CANCER CENTER ONLY)
ALT: 7 U/L (ref 0–44)
AST: 22 U/L (ref 15–41)
Albumin: 3.1 g/dL — ABNORMAL LOW (ref 3.5–5.0)
Alkaline Phosphatase: 58 U/L (ref 38–126)
Anion gap: 12 (ref 5–15)
BUN: 23 mg/dL (ref 8–23)
CO2: 29 mmol/L (ref 22–32)
Calcium: 8.6 mg/dL — ABNORMAL LOW (ref 8.9–10.3)
Chloride: 101 mmol/L (ref 98–111)
Creatinine: 1.09 mg/dL — ABNORMAL HIGH (ref 0.44–1.00)
GFR, Est AFR Am: 57 mL/min — ABNORMAL LOW (ref >60)
GFR, Estimated: 49 mL/min — ABNORMAL LOW (ref >60)
Glucose, Bld: 159 mg/dL — ABNORMAL HIGH (ref 70–99)
Potassium: 5.3 mmol/L — ABNORMAL HIGH (ref 3.5–5.1)
Sodium: 142 mmol/L (ref 135–145)
Total Bilirubin: 0.5 mg/dL (ref 0.3–1.2)
Total Protein: 6.5 g/dL (ref 6.5–8.1)

## 2019-10-01 LAB — CBC WITH DIFFERENTIAL (CANCER CENTER ONLY)
Abs Immature Granulocytes: 0.02 10*3/uL (ref 0.00–0.07)
Basophils Absolute: 0 10*3/uL (ref 0.0–0.1)
Basophils Relative: 0 %
Eosinophils Absolute: 0.1 10*3/uL (ref 0.0–0.5)
Eosinophils Relative: 1 %
HCT: 33.1 % — ABNORMAL LOW (ref 36.0–46.0)
Hemoglobin: 10.2 g/dL — ABNORMAL LOW (ref 12.0–15.0)
Immature Granulocytes: 0 %
Lymphocytes Relative: 24 %
Lymphs Abs: 1.2 10*3/uL (ref 0.7–4.0)
MCH: 29.8 pg (ref 26.0–34.0)
MCHC: 30.8 g/dL (ref 30.0–36.0)
MCV: 96.8 fL (ref 80.0–100.0)
Monocytes Absolute: 0.4 10*3/uL (ref 0.1–1.0)
Monocytes Relative: 8 %
Neutro Abs: 3.5 10*3/uL (ref 1.7–7.7)
Neutrophils Relative %: 67 %
Platelet Count: 200 10*3/uL (ref 150–400)
RBC: 3.42 MIL/uL — ABNORMAL LOW (ref 3.87–5.11)
RDW: 15.3 % (ref 11.5–15.5)
WBC Count: 5.3 10*3/uL (ref 4.0–10.5)
nRBC: 0 % (ref 0.0–0.2)

## 2019-10-01 MED ORDER — FULVESTRANT 250 MG/5ML IM SOLN
INTRAMUSCULAR | Status: AC
  Filled 2019-10-01: qty 10

## 2019-10-01 MED ORDER — FULVESTRANT 250 MG/5ML IM SOLN
500.0000 mg | Freq: Once | INTRAMUSCULAR | Status: AC
  Administered 2019-10-01: 500 mg via INTRAMUSCULAR

## 2019-10-01 NOTE — Assessment & Plan Note (Signed)
Metastatic breast cancer: recently progressive in liver and bone, also pleura.  Faslodex + Ibrance beginning 03/31/2016-March 2020 stopped due to progression Exemestane with everolimus discontinued 08/29/2018 due to hyperglycemia Enzalutamide started 04/15/2019 discontinued 07/08/2019 for progression  [facility] molecular testing: Androgen receptor positive, PDL 1 neg, MSI stable, NTRK1/2/3Neg, T MBB intermediate 9 mutations/Mb, ESR 1-, PI 3 CA negative, BRCA negative (Xeloda discontinued 04/08/2019)  Hospitalization for severe anemia hemoglobin of 2.6 received 4 units of PRBC, today's hemoglobin is 10.2  CT abdomen pelvis 07/01/2019: Progressive pleuroparenchymal, hepatic, peritoneal and osseous metastatic disease.  Trace ascites.  Current treatment: Tamoxifen with Faslodex started 07/30/2019 Toxicities: Denies any adverse effects to tamoxifen. Denies any side effects to the Faslodex injection.  Soreness in the right abdomen area because she lifted her husband who fell down.  It appears to be getting better.  Return to clinic in 2 months  with scans and follow-up

## 2019-10-01 NOTE — Patient Instructions (Signed)
Fulvestrant injection What is this medicine? FULVESTRANT (ful VES trant) blocks the effects of estrogen. It is used to treat breast cancer. This medicine may be used for other purposes; ask your health care provider or pharmacist if you have questions. COMMON BRAND NAME(S): FASLODEX What should I tell my health care provider before I take this medicine? They need to know if you have any of these conditions:  bleeding disorders  liver disease  low blood counts, like low white cell, platelet, or red cell counts  an unusual or allergic reaction to fulvestrant, other medicines, foods, dyes, or preservatives  pregnant or trying to get pregnant  breast-feeding How should I use this medicine? This medicine is for injection into a muscle. It is usually given by a health care professional in a hospital or clinic setting. Talk to your pediatrician regarding the use of this medicine in children. Special care may be needed. Overdosage: If you think you have taken too much of this medicine contact a poison control center or emergency room at once. NOTE: This medicine is only for you. Do not share this medicine with others. What if I miss a dose? It is important not to miss your dose. Call your doctor or health care professional if you are unable to keep an appointment. What may interact with this medicine?  medicines that treat or prevent blood clots like warfarin, enoxaparin, dalteparin, apixaban, dabigatran, and rivaroxaban This list may not describe all possible interactions. Give your health care provider a list of all the medicines, herbs, non-prescription drugs, or dietary supplements you use. Also tell them if you smoke, drink alcohol, or use illegal drugs. Some items may interact with your medicine. What should I watch for while using this medicine? Your condition will be monitored carefully while you are receiving this medicine. You will need important blood work done while you are taking  this medicine. Do not become pregnant while taking this medicine or for at least 1 year after stopping it. Women of child-bearing potential will need to have a negative pregnancy test before starting this medicine. Women should inform their doctor if they wish to become pregnant or think they might be pregnant. There is a potential for serious side effects to an unborn child. Men should inform their doctors if they wish to father a child. This medicine may lower sperm counts. Talk to your health care professional or pharmacist for more information. Do not breast-feed an infant while taking this medicine or for 1 year after the last dose. What side effects may I notice from receiving this medicine? Side effects that you should report to your doctor or health care professional as soon as possible:  allergic reactions like skin rash, itching or hives, swelling of the face, lips, or tongue  feeling faint or lightheaded, falls  pain, tingling, numbness, or weakness in the legs  signs and symptoms of infection like fever or chills; cough; flu-like symptoms; sore throat  vaginal bleeding Side effects that usually do not require medical attention (report to your doctor or health care professional if they continue or are bothersome):  aches, pains  constipation  diarrhea  headache  hot flashes  nausea, vomiting  pain at site where injected  stomach pain This list may not describe all possible side effects. Call your doctor for medical advice about side effects. You may report side effects to FDA at 1-800-FDA-1088. Where should I keep my medicine? This drug is given in a hospital or clinic and will   not be stored at home. NOTE: This sheet is a summary. It may not cover all possible information. If you have questions about this medicine, talk to your doctor, pharmacist, or health care provider.  2020 Elsevier/Gold Standard (2017-08-10 11:34:41)  

## 2019-10-02 ENCOUNTER — Ambulatory Visit: Payer: Medicare Other

## 2019-10-24 ENCOUNTER — Telehealth: Payer: Self-pay | Admitting: Hematology and Oncology

## 2019-10-24 NOTE — Telephone Encounter (Signed)
Added a lab per 6/10 sch message.  Spoke with pt and she is aware of her new appt time.

## 2019-10-30 ENCOUNTER — Other Ambulatory Visit: Payer: Self-pay

## 2019-10-30 ENCOUNTER — Inpatient Hospital Stay: Payer: Medicare Other | Attending: Hematology and Oncology

## 2019-10-30 ENCOUNTER — Inpatient Hospital Stay: Payer: Medicare Other

## 2019-10-30 DIAGNOSIS — C787 Secondary malignant neoplasm of liver and intrahepatic bile duct: Secondary | ICD-10-CM | POA: Insufficient documentation

## 2019-10-30 DIAGNOSIS — C50911 Malignant neoplasm of unspecified site of right female breast: Secondary | ICD-10-CM | POA: Diagnosis present

## 2019-10-30 DIAGNOSIS — C50919 Malignant neoplasm of unspecified site of unspecified female breast: Secondary | ICD-10-CM

## 2019-10-30 DIAGNOSIS — Z170 Estrogen receptor positive status [ER+]: Secondary | ICD-10-CM | POA: Insufficient documentation

## 2019-10-30 DIAGNOSIS — C7951 Secondary malignant neoplasm of bone: Secondary | ICD-10-CM | POA: Insufficient documentation

## 2019-10-30 DIAGNOSIS — Z5111 Encounter for antineoplastic chemotherapy: Secondary | ICD-10-CM | POA: Diagnosis present

## 2019-10-30 DIAGNOSIS — Z95828 Presence of other vascular implants and grafts: Secondary | ICD-10-CM

## 2019-10-30 LAB — CMP (CANCER CENTER ONLY)
ALT: 7 U/L (ref 0–44)
AST: 23 U/L (ref 15–41)
Albumin: 3.1 g/dL — ABNORMAL LOW (ref 3.5–5.0)
Alkaline Phosphatase: 59 U/L (ref 38–126)
Anion gap: 13 (ref 5–15)
BUN: 29 mg/dL — ABNORMAL HIGH (ref 8–23)
CO2: 28 mmol/L (ref 22–32)
Calcium: 9.1 mg/dL (ref 8.9–10.3)
Chloride: 104 mmol/L (ref 98–111)
Creatinine: 1.04 mg/dL — ABNORMAL HIGH (ref 0.44–1.00)
GFR, Est AFR Am: >60 mL/min (ref >60)
GFR, Estimated: 52 mL/min — ABNORMAL LOW (ref >60)
Glucose, Bld: 140 mg/dL — ABNORMAL HIGH (ref 70–99)
Potassium: 5.4 mmol/L — ABNORMAL HIGH (ref 3.5–5.1)
Sodium: 145 mmol/L (ref 135–145)
Total Bilirubin: 0.6 mg/dL (ref 0.3–1.2)
Total Protein: 6.3 g/dL — ABNORMAL LOW (ref 6.5–8.1)

## 2019-10-30 LAB — CBC WITH DIFFERENTIAL (CANCER CENTER ONLY)
Abs Immature Granulocytes: 0.01 10*3/uL (ref 0.00–0.07)
Basophils Absolute: 0 10*3/uL (ref 0.0–0.1)
Basophils Relative: 1 %
Eosinophils Absolute: 0.1 10*3/uL (ref 0.0–0.5)
Eosinophils Relative: 1 %
HCT: 35.2 % — ABNORMAL LOW (ref 36.0–46.0)
Hemoglobin: 10.7 g/dL — ABNORMAL LOW (ref 12.0–15.0)
Immature Granulocytes: 0 %
Lymphocytes Relative: 24 %
Lymphs Abs: 1.3 10*3/uL (ref 0.7–4.0)
MCH: 28.7 pg (ref 26.0–34.0)
MCHC: 30.4 g/dL (ref 30.0–36.0)
MCV: 94.4 fL (ref 80.0–100.0)
Monocytes Absolute: 0.5 10*3/uL (ref 0.1–1.0)
Monocytes Relative: 10 %
Neutro Abs: 3.5 10*3/uL (ref 1.7–7.7)
Neutrophils Relative %: 64 %
Platelet Count: 187 10*3/uL (ref 150–400)
RBC: 3.73 MIL/uL — ABNORMAL LOW (ref 3.87–5.11)
RDW: 15.4 % (ref 11.5–15.5)
WBC Count: 5.4 10*3/uL (ref 4.0–10.5)
nRBC: 0 % (ref 0.0–0.2)

## 2019-10-30 MED ORDER — DENOSUMAB 120 MG/1.7ML ~~LOC~~ SOLN
120.0000 mg | Freq: Once | SUBCUTANEOUS | Status: AC
  Administered 2019-10-30: 120 mg via SUBCUTANEOUS

## 2019-10-30 MED ORDER — DENOSUMAB 120 MG/1.7ML ~~LOC~~ SOLN
SUBCUTANEOUS | Status: AC
  Filled 2019-10-30: qty 1.7

## 2019-10-30 MED ORDER — FULVESTRANT 250 MG/5ML IM SOLN
500.0000 mg | Freq: Once | INTRAMUSCULAR | Status: AC
  Administered 2019-10-30: 500 mg via INTRAMUSCULAR

## 2019-10-30 MED ORDER — FULVESTRANT 250 MG/5ML IM SOLN
INTRAMUSCULAR | Status: AC
  Filled 2019-10-30: qty 10

## 2019-10-30 NOTE — Patient Instructions (Signed)
Fulvestrant injection What is this medicine? FULVESTRANT (ful VES trant) blocks the effects of estrogen. It is used to treat breast cancer. This medicine may be used for other purposes; ask your health care provider or pharmacist if you have questions. COMMON BRAND NAME(S): FASLODEX What should I tell my health care provider before I take this medicine? They need to know if you have any of these conditions:  bleeding disorders  liver disease  low blood counts, like low white cell, platelet, or red cell counts  an unusual or allergic reaction to fulvestrant, other medicines, foods, dyes, or preservatives  pregnant or trying to get pregnant  breast-feeding How should I use this medicine? This medicine is for injection into a muscle. It is usually given by a health care professional in a hospital or clinic setting. Talk to your pediatrician regarding the use of this medicine in children. Special care may be needed. Overdosage: If you think you have taken too much of this medicine contact a poison control center or emergency room at once. NOTE: This medicine is only for you. Do not share this medicine with others. What if I miss a dose? It is important not to miss your dose. Call your doctor or health care professional if you are unable to keep an appointment. What may interact with this medicine?  medicines that treat or prevent blood clots like warfarin, enoxaparin, dalteparin, apixaban, dabigatran, and rivaroxaban This list may not describe all possible interactions. Give your health care provider a list of all the medicines, herbs, non-prescription drugs, or dietary supplements you use. Also tell them if you smoke, drink alcohol, or use illegal drugs. Some items may interact with your medicine. What should I watch for while using this medicine? Your condition will be monitored carefully while you are receiving this medicine. You will need important blood work done while you are taking  this medicine. Do not become pregnant while taking this medicine or for at least 1 year after stopping it. Women of child-bearing potential will need to have a negative pregnancy test before starting this medicine. Women should inform their doctor if they wish to become pregnant or think they might be pregnant. There is a potential for serious side effects to an unborn child. Men should inform their doctors if they wish to father a child. This medicine may lower sperm counts. Talk to your health care professional or pharmacist for more information. Do not breast-feed an infant while taking this medicine or for 1 year after the last dose. What side effects may I notice from receiving this medicine? Side effects that you should report to your doctor or health care professional as soon as possible:  allergic reactions like skin rash, itching or hives, swelling of the face, lips, or tongue  feeling faint or lightheaded, falls  pain, tingling, numbness, or weakness in the legs  signs and symptoms of infection like fever or chills; cough; flu-like symptoms; sore throat  vaginal bleeding Side effects that usually do not require medical attention (report to your doctor or health care professional if they continue or are bothersome):  aches, pains  constipation  diarrhea  headache  hot flashes  nausea, vomiting  pain at site where injected  stomach pain This list may not describe all possible side effects. Call your doctor for medical advice about side effects. You may report side effects to FDA at 1-800-FDA-1088. Where should I keep my medicine? This drug is given in a hospital or clinic and will  not be stored at home. NOTE: This sheet is a summary. It may not cover all possible information. If you have questions about this medicine, talk to your doctor, pharmacist, or health care provider.  2020 Elsevier/Gold Standard (2017-08-10 11:34:41) Denosumab injection What is this  medicine? DENOSUMAB (den oh sue mab) slows bone breakdown. Prolia is used to treat osteoporosis in women after menopause and in men, and in people who are taking corticosteroids for 6 months or more. Delton See is used to treat a high calcium level due to cancer and to prevent bone fractures and other bone problems caused by multiple myeloma or cancer bone metastases. Delton See is also used to treat giant cell tumor of the bone. This medicine may be used for other purposes; ask your health care provider or pharmacist if you have questions. COMMON BRAND NAME(S): Prolia, XGEVA What should I tell my health care provider before I take this medicine? They need to know if you have any of these conditions:  dental disease  having surgery or tooth extraction  infection  kidney disease  low levels of calcium or Vitamin D in the blood  malnutrition  on hemodialysis  skin conditions or sensitivity  thyroid or parathyroid disease  an unusual reaction to denosumab, other medicines, foods, dyes, or preservatives  pregnant or trying to get pregnant  breast-feeding How should I use this medicine? This medicine is for injection under the skin. It is given by a health care professional in a hospital or clinic setting. A special MedGuide will be given to you before each treatment. Be sure to read this information carefully each time. For Prolia, talk to your pediatrician regarding the use of this medicine in children. Special care may be needed. For Delton See, talk to your pediatrician regarding the use of this medicine in children. While this drug may be prescribed for children as young as 13 years for selected conditions, precautions do apply. Overdosage: If you think you have taken too much of this medicine contact a poison control center or emergency room at once. NOTE: This medicine is only for you. Do not share this medicine with others. What if I miss a dose? It is important not to miss your dose. Call  your doctor or health care professional if you are unable to keep an appointment. What may interact with this medicine? Do not take this medicine with any of the following medications:  other medicines containing denosumab This medicine may also interact with the following medications:  medicines that lower your chance of fighting infection  steroid medicines like prednisone or cortisone This list may not describe all possible interactions. Give your health care provider a list of all the medicines, herbs, non-prescription drugs, or dietary supplements you use. Also tell them if you smoke, drink alcohol, or use illegal drugs. Some items may interact with your medicine. What should I watch for while using this medicine? Visit your doctor or health care professional for regular checks on your progress. Your doctor or health care professional may order blood tests and other tests to see how you are doing. Call your doctor or health care professional for advice if you get a fever, chills or sore throat, or other symptoms of a cold or flu. Do not treat yourself. This drug may decrease your body's ability to fight infection. Try to avoid being around people who are sick. You should make sure you get enough calcium and vitamin D while you are taking this medicine, unless your doctor tells  you not to. Discuss the foods you eat and the vitamins you take with your health care professional. See your dentist regularly. Brush and floss your teeth as directed. Before you have any dental work done, tell your dentist you are receiving this medicine. Do not become pregnant while taking this medicine or for 5 months after stopping it. Talk with your doctor or health care professional about your birth control options while taking this medicine. Women should inform their doctor if they wish to become pregnant or think they might be pregnant. There is a potential for serious side effects to an unborn child. Talk to your  health care professional or pharmacist for more information. What side effects may I notice from receiving this medicine? Side effects that you should report to your doctor or health care professional as soon as possible:  allergic reactions like skin rash, itching or hives, swelling of the face, lips, or tongue  bone pain  breathing problems  dizziness  jaw pain, especially after dental work  redness, blistering, peeling of the skin  signs and symptoms of infection like fever or chills; cough; sore throat; pain or trouble passing urine  signs of low calcium like fast heartbeat, muscle cramps or muscle pain; pain, tingling, numbness in the hands or feet; seizures  unusual bleeding or bruising  unusually weak or tired Side effects that usually do not require medical attention (report to your doctor or health care professional if they continue or are bothersome):  constipation  diarrhea  headache  joint pain  loss of appetite  muscle pain  runny nose  tiredness  upset stomach This list may not describe all possible side effects. Call your doctor for medical advice about side effects. You may report side effects to FDA at 1-800-FDA-1088. Where should I keep my medicine? This medicine is only given in a clinic, doctor's office, or other health care setting and will not be stored at home. NOTE: This sheet is a summary. It may not cover all possible information. If you have questions about this medicine, talk to your doctor, pharmacist, or health care provider.  2020 Elsevier/Gold Standard (2017-09-08 16:10:44)  

## 2019-10-31 ENCOUNTER — Telehealth: Payer: Self-pay | Admitting: *Deleted

## 2019-10-31 ENCOUNTER — Encounter: Payer: Self-pay | Admitting: *Deleted

## 2019-10-31 NOTE — Telephone Encounter (Signed)
Attempt x1 to contact pt regarding potassium being 5.4 and to evaluate if pt is taking oral potassium or a potassium sparing diuretic.  No answer, LVM to return call to the office.

## 2019-10-31 NOTE — Progress Notes (Signed)
Per request of Wilber Bihari, NP lab results faxed to pt PCP Dr. [name] [phone]) for evaluation of hyperkalemia.

## 2019-10-31 NOTE — Telephone Encounter (Signed)
Received call back from pt.  RN informed pt her potassium is 5.4.  Pt states she is not taking any oral potassium supplements or potassium sparring diuretics.  Pt states last week she forgot to take her medications two days in a row and believes her potassium is elevated due to missing her medications.  RN will inform Wilber Bihari, NP.  Pt currently scheduled on 12/02/19 to have labs drawn again.

## 2019-11-06 ENCOUNTER — Encounter (INDEPENDENT_AMBULATORY_CARE_PROVIDER_SITE_OTHER): Payer: Self-pay | Admitting: Ophthalmology

## 2019-11-06 ENCOUNTER — Other Ambulatory Visit: Payer: Self-pay

## 2019-11-06 ENCOUNTER — Ambulatory Visit (INDEPENDENT_AMBULATORY_CARE_PROVIDER_SITE_OTHER): Payer: Medicare Other | Admitting: Ophthalmology

## 2019-11-06 DIAGNOSIS — H34832 Tributary (branch) retinal vein occlusion, left eye, with macular edema: Secondary | ICD-10-CM

## 2019-11-06 MED ORDER — BEVACIZUMAB CHEMO INJECTION 1.25MG/0.05ML SYRINGE FOR KALEIDOSCOPE
1.2500 mg | INTRAVITREAL | Status: AC | PRN
  Administered 2019-11-06: 1.25 mg via INTRAVITREAL

## 2019-11-06 NOTE — Assessment & Plan Note (Signed)
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  Slightly improved status post intravitreal Avastin, will repeat OS today to protect the foveal perfusion macular function

## 2019-11-06 NOTE — Progress Notes (Signed)
11/06/2019     CHIEF COMPLAINT Patient presents for Retina Follow Up   HISTORY OF PRESENT ILLNESS: Brittany WAMPOLE is a 74 y.o. female who presents to the clinic today for:   HPI    Retina Follow Up    Patient presents with  CRVO/BRVO.  In left eye.  Duration of 6 weeks.  Since onset it is stable.          Comments    6 week follow up - OCT OU, Poss Avastin OS Patient denies change in vision and overall has no complaints.        Last edited by Gerda Diss on 11/06/2019 10:56 AM. (History)      Referring physician: [name], MD [address],  [place] [address]  HISTORICAL INFORMATION:   Selected notes from the MEDICAL RECORD NUMBER    Lab Results  Component Value Date   HGBA1C 6.5 (H) 04/29/2019     CURRENT MEDICATIONS: No current outpatient medications on file. (Ophthalmic Drugs)   No current facility-administered medications for this visit. (Ophthalmic Drugs)   Current Outpatient Medications (Other)  Medication Sig  . ACCU-CHEK AVIVA PLUS test strip   . Accu-Chek FastClix Lancets MISC   . amiodarone (PACERONE) 200 MG tablet Take 1 tablet (200 mg total) by mouth daily.  Marland Kitchen atorvastatin (LIPITOR) 20 MG tablet Take 1 tablet (20 mg total) by mouth daily at 6 PM.  . Calcium Carb-Cholecalciferol (CALCIUM 600+D3) 600-800 MG-UNIT TABS Take 1 tablet by mouth 2 (two) times daily.   . Cholecalciferol (VITAMIN D3) 2000 UNITS TABS Take 2,000 mg by mouth daily.  . ferrous sulfate 325 (65 FE) MG tablet Take 325 mg by mouth daily as needed (energy).   . fish oil-omega-3 fatty acids 1000 MG capsule Take 1 g by mouth 2 (two) times daily.   . furosemide (LASIX) 20 MG tablet Take 1 tablet (20 mg total) by mouth daily. Reported on 05/07/2015  . gabapentin (NEURONTIN) 100 MG capsule Take 100 mg by mouth 2 (two) times daily.  . Ipratropium-Albuterol (COMBIVENT RESPIMAT) 20-100 MCG/ACT AERS respimat Inhale 1 puff into the lungs every 6 (six) hours.  (Patient taking differently: Inhale 1 puff into the lungs every 6 (six) hours as needed for wheezing or shortness of breath. )  . Magnesium Oxide (MAG-OXIDE) 200 MG TABS Take 1 tablet (200 mg total) by mouth daily. (Patient taking differently: Take 200 mg by mouth at bedtime. )  . metFORMIN (GLUCOPHAGE) 500 MG tablet Take 2 tablets (1,000 mg total) by mouth 2 (two) times daily with a meal.  . metoprolol succinate (TOPROL-XL) 25 MG 24 hr tablet Take 1 tablet (25 mg total) by mouth daily.  . pantoprazole (PROTONIX) 40 MG tablet Take 1 tablet (40 mg total) by mouth 2 (two) times daily before a meal.  . sodium chloride (OCEAN) 0.65 % SOLN nasal spray Place 1 spray into both nostrils as needed for congestion.  . tamoxifen (NOLVADEX) 20 MG tablet Take 1 tablet (20 mg total) by mouth daily.   No current facility-administered medications for this visit. (Other)      REVIEW OF SYSTEMS:    ALLERGIES Allergies  Allergen Reactions  . Prednisolone Shortness Of Breath and Other (See Comments)     increased BP  . Prednisone Anaphylaxis  . Sulfa Antibiotics Swelling    "Eyes swelled shut" - reaction to eye drops    PAST MEDICAL HISTORY Past Medical History:  Diagnosis Date  .  Arthritis   . Breast cancer ([place])   . Diabetes mellitus   . Endometrial carcinoma ([place]) 10/2008  . Family history of breast cancer   . Family history of colon cancer   . Family history of skin cancer   . Fluid overload   . History of radiation therapy 04/13/2009, 04/16/2009, 04/27/2009, 05/07/2009, 05/18/2009   3000 cGy to proximal vagina  . Hyperlipidemia   . Iron deficiency   . Melanoma ([place]) 2010   rt arm and back  . Ovarian cancer ([place])   . Pleural effusion 04/2010   Past Surgical History:  Procedure Laterality Date  . ABDOMINAL HYSTERECTOMY  10/2008  . APPENDECTOMY    . CATARACT EXTRACTION Right   . CATARACT EXTRACTION Left   . ESOPHAGOGASTRODUODENOSCOPY (EGD) WITH PROPOFOL N/A 12/30/2018   Procedure:  ESOPHAGOGASTRODUODENOSCOPY (EGD) WITH PROPOFOL;  Surgeon: Ronald Lobo, MD;  Location: [facility];  Service: Endoscopy;  Laterality: N/A;  . INSERTION OF MESH  05/28/2012   Procedure: INSERTION OF MESH;  Surgeon: Adin Hector, MD;  Location: [facility];  Service: General;  Laterality: N/A;  . IR IMAGING GUIDED PORT INSERTION  07/23/2019  . IR US GUIDE BX ASP/DRAIN  07/23/2019  . KNEE ARTHROSCOPY  04/2010   Right knee  . LAPAROSCOPIC LYSIS OF ADHESIONS  05/28/2012   Procedure: LAPAROSCOPIC LYSIS OF ADHESIONS;  Surgeon: Adin Hector, MD;  Location: [facility];  Service: General;  Laterality: N/A;  . MASTECTOMY  1997   Bilateral with lymph nodes  . Melanoma removal  02/2009, 07/2009  . VENTRAL HERNIA REPAIR  05/28/2012   Procedure: LAPAROSCOPIC VENTRAL HERNIA;  Surgeon: Adin Hector, MD;  Location: [facility];  Service: General;  Laterality: N/A;    FAMILY HISTORY Family History  Problem Relation Age of Onset  . Stroke Mother   . Cancer Father        'voice box'  . Colon cancer Sister        dx >50  . Heart attack Brother   . Skin cancer Brother 40  . Cancer Sister        primary site unk, dx >50  . Diabetes Brother   . Stroke Brother   . Stroke Sister   . ALS Sister   . Colon cancer Sister 64  . Dementia Sister   . Breast cancer Sister        dx >5-, met to brain    SOCIAL HISTORY Social History   Tobacco Use  . Smoking status: Never Smoker  . Smokeless tobacco: Never Used  Substance Use Topics  . Alcohol use: No  . Drug use: No         OPHTHALMIC EXAM:  Base Eye Exam    Visual Acuity (Snellen - Linear)      Right Left   Dist [place] 20/20-2 20/40-1   Dist ph [place]  NI       Tonometry (Tonopen, 11:02 AM)      Right Left   Pressure 12 12       Pupils      Pupils Dark Light Shape React APD   Right PERRL 4 3 Round Sluggish None   Left PERRL 3 2 Round Sluggish None       Visual Fields (Counting fingers)      Left Right    Full Full       Extraocular Movement        Right Left    Full Full  Neuro/Psych    Oriented x3: Yes   Mood/Affect: Normal       Dilation    Left eye: 1.0% Mydriacyl, 2.5% Phenylephrine @ 11:02 AM        Slit Lamp and Fundus Exam    External Exam      Right Left   External Normal Normal       Slit Lamp Exam      Right Left   Lids/Lashes Normal Normal   Conjunctiva/Sclera White and quiet White and quiet   Cornea Clear Clear   Anterior Chamber Deep and quiet Deep and quiet   Iris Round and reactive Round and reactive   Lens Posterior chamber intraocular lens Posterior chamber intraocular lens   Anterior Vitreous Normal Normal       Fundus Exam      Right Left   Posterior Vitreous  Normal   Disc  Normal   C/D Ratio  0.4   Macula  Microaneurysms, no macular thickening   Vessels  Old twig macular branch retinal vein occlusion, NPDR- Mild   Periphery  Normal          IMAGING AND PROCEDURES  Imaging and Procedures for 11/06/19  OCT, Retina - OU - Both Eyes       Right Eye Quality was good. Scan locations included subfoveal. Central Foveal Thickness: 310. Progression has been stable. Findings include normal foveal contour.   Left Eye Quality was good. Scan locations included subfoveal. Central Foveal Thickness: 379. Progression has improved. Findings include vitreomacular adhesion , cystoid macular edema.   Notes OS with Twig macular branch retinal vein occlusion CME secondary to this, slightly improved since last injection of intravitreal Avastin some 6 weeks previous.       Intravitreal Injection, Pharmacologic Agent - OS - Left Eye       Time Out 11/06/2019. 12:11 PM. Confirmed correct patient, procedure, site, and patient consented.   Anesthesia Topical anesthesia was used. Anesthetic medications included [place] 3.5%.   Procedure Preparation included Ofloxacin , 10% betadine to eyelids. A 30 gauge needle was used.   Injection:  1.25 mg Bevacizumab (AVASTIN) SOLN   NDC:  [address]-9, Lot: [id number]   Route: Intravitreal, Site: Left Eye, Waste: 0 mg  Post-op Post injection exam found visual acuity of at least counting fingers. The patient tolerated the procedure well. There were no complications. The patient received written and verbal post procedure care education. Post injection medications were not given.                 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  Slightly improved status post intravitreal Avastin, will repeat OS today to protect the foveal perfusion macular function      ICD-10-CM   1. 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  2.  3.  Ophthalmic Meds Ordered this visit:  Meds ordered this encounter  Medications  . Bevacizumab (AVASTIN) SOLN 1.25 mg       Return in about 6 weeks (around 12/18/2019) for dilate, OS, AVASTIN OCT.  There are no Patient Instructions on file for this visit.   Explained the diagnoses, plan, and  follow up with the patient and they expressed understanding.  Patient expressed understanding of the importance of proper follow up care.   Clent Demark Kashus Karlen M.D. Diseases & Surgery of the Retina and Vitreous [facility] 11/06/19     Abbreviations: M myopia (nearsighted); A astigmatism; H hyperopia (farsighted); P presbyopia; Mrx spectacle prescription;  CTL contact lenses; OD right eye; OS left eye; OU both eyes  XT exotropia; ET esotropia; PEK punctate epithelial keratitis; PEE punctate epithelial erosions; DES dry eye syndrome; MGD meibomian gland dysfunction; ATs artificial tears; PFAT's preservative free artificial tears; [place] nuclear sclerotic cataract; PSC  posterior subcapsular cataract; ERM epi-retinal membrane; PVD posterior vitreous detachment; RD retinal detachment; DM diabetes mellitus; DR diabetic retinopathy; NPDR non-proliferative diabetic retinopathy; PDR proliferative diabetic retinopathy; CSME clinically significant macular edema; DME diabetic macular edema; dbh dot blot hemorrhages; CWS cotton wool spot; POAG primary open angle glaucoma; C/D cup-to-disc ratio; HVF humphrey visual field; GVF goldmann visual field; OCT optical coherence tomography; IOP intraocular pressure; BRVO Branch retinal vein occlusion; CRVO central retinal vein occlusion; CRAO central retinal artery occlusion; BRAO branch retinal artery occlusion; RT retinal tear; SB scleral buckle; PPV pars plana vitrectomy; VH Vitreous hemorrhage; PRP panretinal laser photocoagulation; IVK intravitreal kenalog; VMT vitreomacular traction; MH Macular hole;  NVD neovascularization of the disc; NVE neovascularization elsewhere; AREDS age related eye disease study; ARMD age related macular degeneration; POAG primary open angle glaucoma; EBMD epithelial/anterior basement membrane dystrophy; ACIOL anterior chamber intraocular lens; IOL intraocular lens; PCIOL posterior chamber intraocular lens; Phaco/IOL phacoemulsification with intraocular lens placement; [place] photorefractive keratectomy; LASIK laser assisted in situ keratomileusis; HTN hypertension; DM diabetes mellitus; COPD chronic obstructive pulmonary disease

## 2019-11-13 ENCOUNTER — Ambulatory Visit (HOSPITAL_COMMUNITY)
Admission: RE | Admit: 2019-11-13 | Discharge: 2019-11-13 | Disposition: A | Discharge status: Home / Self Care | Payer: Medicare Other | Source: Ambulatory Visit | Attending: Nurse Practitioner | Admitting: Nurse Practitioner

## 2019-11-13 ENCOUNTER — Other Ambulatory Visit: Payer: Self-pay

## 2019-11-13 VITALS — BP 100/50 | HR 65 | Ht 63.5 in | Wt 164.0 lb

## 2019-11-13 DIAGNOSIS — Z800 Family history of malignant neoplasm of digestive organs: Secondary | ICD-10-CM | POA: Diagnosis not present

## 2019-11-13 DIAGNOSIS — Z823 Family history of stroke: Secondary | ICD-10-CM | POA: Diagnosis not present

## 2019-11-13 DIAGNOSIS — M1990 Unspecified osteoarthritis, unspecified site: Secondary | ICD-10-CM | POA: Diagnosis not present

## 2019-11-13 DIAGNOSIS — Z79810 Long term (current) use of selective estrogen receptor modulators (SERMs): Secondary | ICD-10-CM | POA: Diagnosis not present

## 2019-11-13 DIAGNOSIS — Z808 Family history of malignant neoplasm of other organs or systems: Secondary | ICD-10-CM | POA: Diagnosis not present

## 2019-11-13 DIAGNOSIS — E611 Iron deficiency: Secondary | ICD-10-CM | POA: Diagnosis not present

## 2019-11-13 DIAGNOSIS — Z79899 Other long term (current) drug therapy: Secondary | ICD-10-CM | POA: Diagnosis not present

## 2019-11-13 DIAGNOSIS — Z820 Family history of epilepsy and other diseases of the nervous system: Secondary | ICD-10-CM | POA: Diagnosis not present

## 2019-11-13 DIAGNOSIS — Z7984 Long term (current) use of oral hypoglycemic drugs: Secondary | ICD-10-CM | POA: Insufficient documentation

## 2019-11-13 DIAGNOSIS — E119 Type 2 diabetes mellitus without complications: Secondary | ICD-10-CM | POA: Insufficient documentation

## 2019-11-13 DIAGNOSIS — Z85820 Personal history of malignant melanoma of skin: Secondary | ICD-10-CM | POA: Insufficient documentation

## 2019-11-13 DIAGNOSIS — Z888 Allergy status to other drugs, medicaments and biological substances status: Secondary | ICD-10-CM | POA: Insufficient documentation

## 2019-11-13 DIAGNOSIS — Z9221 Personal history of antineoplastic chemotherapy: Secondary | ICD-10-CM | POA: Insufficient documentation

## 2019-11-13 DIAGNOSIS — D6869 Other thrombophilia: Secondary | ICD-10-CM

## 2019-11-13 DIAGNOSIS — Z8249 Family history of ischemic heart disease and other diseases of the circulatory system: Secondary | ICD-10-CM | POA: Diagnosis not present

## 2019-11-13 DIAGNOSIS — C50919 Malignant neoplasm of unspecified site of unspecified female breast: Secondary | ICD-10-CM | POA: Insufficient documentation

## 2019-11-13 DIAGNOSIS — Z8543 Personal history of malignant neoplasm of ovary: Secondary | ICD-10-CM | POA: Insufficient documentation

## 2019-11-13 DIAGNOSIS — I4891 Unspecified atrial fibrillation: Secondary | ICD-10-CM | POA: Diagnosis present

## 2019-11-13 DIAGNOSIS — Z882 Allergy status to sulfonamides status: Secondary | ICD-10-CM | POA: Insufficient documentation

## 2019-11-13 DIAGNOSIS — I480 Paroxysmal atrial fibrillation: Secondary | ICD-10-CM | POA: Diagnosis not present

## 2019-11-13 DIAGNOSIS — Z833 Family history of diabetes mellitus: Secondary | ICD-10-CM | POA: Insufficient documentation

## 2019-11-13 DIAGNOSIS — E785 Hyperlipidemia, unspecified: Secondary | ICD-10-CM | POA: Insufficient documentation

## 2019-11-13 DIAGNOSIS — I483 Typical atrial flutter: Secondary | ICD-10-CM | POA: Insufficient documentation

## 2019-11-13 LAB — TSH: TSH: 2.75 u[IU]/mL (ref 0.350–4.500)

## 2019-11-13 NOTE — Progress Notes (Signed)
Primary Care Physician: [name], MD Referring Physician: [facility] ER f/u   Brittany Porter is a 74 y.o. female with a h/o Breast CA with  mets to the [place] found 03/31/16, on Faslodex and Ibrance, DM, that was in the ER, 01/09/18, for new onset of atrial flutter with RVR. V rate was around 170 bpm. The pt did not feel the episode but it was picked up on routine check of V/S. She  was not started on anticoagulation for concerns of her therapy for h/o breast cancer. CHA2DS2VASc score was 3. She was found to be in typical atrial flutter in the ER. When I initially  saw her 01/16/18,  she was in rhythm.  No tobacco, alcohol, snoring. Is sedentary and obese. She was  started on eliquis.  She saw Dr. Rayann Heman in f/u but was not interested in a flutter ablation. She returned to the ER, 06/06/18 and had a cardioversion for afib with RVR for finding  afib with RVR around 170 bpm. BP  soft at 256 asystolic. She was tolerating well but discussed with  Dr. Caryl Comes, that saw her in the clinic as well today , and discussed that  hospitalization would be probably be best way to further treat arrhythmia. She is in agreement.  F/u in afib clinic. 3/5. She continues on amiodarone 200 mg daily. She is in SR and has not noted any afib. She has no complaints.  F/u  6/22, she remains in SR. Is taking a chemo drug for her mets that pt states is aggravating her BS. Otherwise, no complaints, has not noted any afib.  F/u in afib clinic, 05/16/19. She reports that she has not noted any afib. She was taken off xarelto in August 2020, as she had a GI bleed  at that time that required 4 units of blood.  She remains off and was not told to go back on.  She remains on amiodarone 200 mg daily. Recent cmet with normal liver enzymes, TSH not checked recently per Epic. Pt feels it was checked with her PCP and will try to results faxed over.   F/u in the afib clinic, 6/30, for h/o afib/flutter and on amiodarone 200 mg daily. She remains  off anticoagulation for prior GI bleed. She continues with tamoxifen now  as she has finished chemo breast CA. She does not report any afib.   Today, she denies symptoms of palpitations, chest pain, shortness of breath, orthopnea, PND, lower extremity edema, dizziness, presyncope, syncope, or neurologic sequela. The patient is tolerating medications without difficulties and is otherwise without complaint today.   Past Medical History:  Diagnosis Date  . Arthritis   . Breast cancer ([place])   . Diabetes mellitus   . Endometrial carcinoma ([place]) 10/2008  . Family history of breast cancer   . Family history of colon cancer   . Family history of skin cancer   . Fluid overload   . History of radiation therapy 04/13/2009, 04/16/2009, 04/27/2009, 05/07/2009, 05/18/2009   3000 cGy to proximal vagina  . Hyperlipidemia   . Iron deficiency   . Melanoma ([place]) 2010   rt arm and back  . Ovarian cancer ([place])   . Pleural effusion 04/2010   Past Surgical History:  Procedure Laterality Date  . ABDOMINAL HYSTERECTOMY  10/2008  . APPENDECTOMY    . CATARACT EXTRACTION Right   . CATARACT EXTRACTION Left   . ESOPHAGOGASTRODUODENOSCOPY (EGD) WITH PROPOFOL N/A 12/30/2018   Procedure: ESOPHAGOGASTRODUODENOSCOPY (EGD) WITH PROPOFOL;  Surgeon: Ronald Lobo, MD;  Location: [facility] END[facility]COPY;  Service: Endoscopy;  Laterality: N/A;  . INSERTION OF MESH  05/28/2012   Procedure: INSERTION OF MESH;  Surgeon: Adin Hector, MD;  Location: [facility];  Service: General;  Laterality: N/A;  . IR IMAGING GUIDED PORT INSERTION  07/23/2019  . IR US GUIDE BX ASP/DRAIN  07/23/2019  . KNEE ARTHROSCOPY  04/2010   Right knee  . LAPAROSCOPIC LYSIS OF ADHESIONS  05/28/2012   Procedure: LAPAROSCOPIC LYSIS OF ADHESIONS;  Surgeon: Adin Hector, MD;  Location: [facility];  Service: General;  Laterality: N/A;  . MASTECTOMY  1997   Bilateral with lymph nodes  . Melanoma removal  02/2009, 07/2009  . VENTRAL HERNIA REPAIR  05/28/2012   Procedure:  LAPAROSCOPIC VENTRAL HERNIA;  Surgeon: Adin Hector, MD;  Location: [place];  Service: General;  Laterality: N/A;    Current Outpatient Medications  Medication Sig Dispense Refill  . ACCU-CHEK AVIVA PLUS test strip     . Accu-Chek FastClix Lancets MISC     . amiodarone (PACERONE) 200 MG tablet Take 1 tablet (200 mg total) by mouth daily. 90 tablet 3  . atorvastatin (LIPITOR) 20 MG tablet Take 1 tablet (20 mg total) by mouth daily at 6 PM. 45 tablet 9  . Calcium Carb-Cholecalciferol (CALCIUM 600+D3) 600-800 MG-UNIT TABS Take 1 tablet by mouth 2 (two) times daily.     . Cholecalciferol (VITAMIN D3) 2000 UNITS TABS Take 2,000 mg by mouth daily.    . ferrous sulfate 325 (65 FE) MG tablet Take 325 mg by mouth daily as needed (energy).     . fish oil-omega-3 fatty acids 1000 MG capsule Take 1 g by mouth 2 (two) times daily.     . furosemide (LASIX) 20 MG tablet Take 1 tablet (20 mg total) by mouth daily. Reported on 05/07/2015 30 tablet 0  . gabapentin (NEURONTIN) 100 MG capsule Take 100 mg by mouth 2 (two) times daily.    . Ipratropium-Albuterol (COMBIVENT RESPIMAT) 20-100 MCG/ACT AERS respimat Inhale 1 puff into the lungs every 6 (six) hours. (Patient taking differently: Inhale 1 puff into the lungs every 6 (six) hours as needed for wheezing or shortness of breath. ) 4 g 6  . Magnesium Oxide (MAG-OXIDE) 200 MG TABS Take 1 tablet (200 mg total) by mouth daily. (Patient taking differently: Take 200 mg by mouth at bedtime. ) 30 tablet 0  . metFORMIN (GLUCOPHAGE) 500 MG tablet Take 2 tablets (1,000 mg total) by mouth 2 (two) times daily with a meal. 120 tablet 0  . metoprolol succinate (TOPROL-XL) 25 MG 24 hr tablet Take 1 tablet (25 mg total) by mouth daily. 90 tablet 3  . pantoprazole (PROTONIX) 40 MG tablet Take 1 tablet (40 mg total) by mouth 2 (two) times daily before a meal. 60 tablet 6  . sodium chloride (OCEAN) 0.65 % SOLN nasal spray Place 1 spray into both nostrils as needed for congestion.     . tamoxifen (NOLVADEX) 20 MG tablet Take 1 tablet (20 mg total) by mouth daily. 90 tablet 3   No current facility-administered medications for this encounter.    Allergies  Allergen Reactions  . Prednisolone Shortness Of Breath and Other (See Comments)     increased BP  . Prednisone Anaphylaxis  . Sulfa Antibiotics Swelling    "Eyes swelled shut" - reaction to eye drops    Social History   Socioeconomic History  . Marital status: Married    Spouse name:  Not on file  . Number of children: Not on file  . Years of education: Not on file  . Highest education level: Not on file  Occupational History  . Not on file  Tobacco Use  . Smoking status: Never Smoker  . Smokeless tobacco: Never Used  Substance and Sexual Activity  . Alcohol use: No  . Drug use: No  . Sexual activity: Yes  Other Topics Concern  . Not on file  Social History Narrative  . Not on file   Social Determinants of Health   Financial Resource Strain:   . Difficulty of Paying Living Expenses:   Food Insecurity:   . Worried About Charity fundraiser in the Last Year:   . Arboriculturist in the Last Year:   Transportation Needs:   . Film/video editor (Medical):   Marland Kitchen Lack of Transportation (Non-Medical):   Physical Activity:   . Days of Exercise per Week:   . Minutes of Exercise per Session:   Stress:   . Feeling of Stress :   Social Connections:   . Frequency of Communication with Friends and Family:   . Frequency of Social Gatherings with Friends and Family:   . Attends Religious Services:   . Active Member of Clubs or Organizations:   . Attends Archivist Meetings:   Marland Kitchen Marital Status:   Intimate Partner Violence:   . Fear of Current or Ex-Partner:   . Emotionally Abused:   Marland Kitchen Physically Abused:   . Sexually Abused:     Family History  Problem Relation Age of Onset  . Stroke Mother   . Cancer Father        'voice box'  . Colon cancer Sister        dx >50  . Heart attack  Brother   . Skin cancer Brother 53  . Cancer Sister        primary site unk, dx >50  . Diabetes Brother   . Stroke Brother   . Stroke Sister   . ALS Sister   . Colon cancer Sister 60  . Dementia Sister   . Breast cancer Sister        dx >5-, met to brain    ROS- All systems are reviewed and negative except as per the HPI above  Physical Exam: There were no vitals filed for this visit. Wt Readings from Last 3 Encounters:  10/01/19 73.9 kg  09/03/19 74.5 kg  07/30/19 75.8 kg    Labs: Lab Results  Component Value Date   NA 145 10/30/2019   K 5.4 (H) 10/30/2019   CL 104 10/30/2019   CO2 28 10/30/2019   GLUCOSE 140 (H) 10/30/2019   BUN 29 (H) 10/30/2019   CREATININE 1.04 (H) 10/30/2019   CALCIUM 9.1 10/30/2019   MG 2.1 06/12/2018   Lab Results  Component Value Date   INR 1.0 07/23/2019   Lab Results  Component Value Date   CHOL 167 08/09/2018   HDL 61 08/09/2018   LDLCALC 74 08/09/2018   TRIG 161 (H) 08/09/2018     GEN- The patient is well appearing, alert and oriented x 3 today.   Head- normocephalic, atraumatic Eyes-  Sclera clear, conjunctiva pink Ears- hearing intact Oropharynx- clear Neck- supple, no JVP Lymph- no cervical lymphadenopathy Lungs- Clear to ausculation bilaterally, normal work of breathing Heart- Regular rate and rhythm, no murmurs, rubs or gallops, PMI not laterally displaced GI- soft, NT, ND, + BS  Extremities- no clubbing, cyanosis, or edema MS- no significant deformity or atrophy Skin- no rash or lesion Psych- euthymic mood, full affect Neuro- strength and sensation are intact  EKG- NSR at 65 bpm, PR int 168 ms, qrs int 78 ms, qtc 430 ms Epic records reviewed    Assessment and Plan: 1. H/o of typical a flutter and afib with RVR  Successful cardioversion for afib with RVR 06/06/18 but ERAF Pt was admitted 06/11/18 for return of afib with RVR and she was loaded on amiodarone drip and spontaneously converted   Continue metoprolol  succinate 25 mg qd Continue with amiodarone 200 mg qd, I considered reducing amiodarone to 100 mg daily, since afib has been quiet, but that may trigger afib and with pt not being able to take DOAC's, will not adjust dose   Cmet recently checked and liver enzymes were WNL, and I will check TSH today   2. CHA2DS2VASc score of 4 Off xareleto for GI bleed in August requiring 4 units of blood Per pt she was not   to restart  Recheck in 6 months   Butch Penny C. Laikyn Gewirtz, [facility] [address] [place], [place] [address] [phone]

## 2019-11-26 ENCOUNTER — Encounter: Payer: Self-pay | Admitting: Pharmacy Technician

## 2019-11-26 ENCOUNTER — Other Ambulatory Visit: Payer: Self-pay | Admitting: *Deleted

## 2019-11-26 DIAGNOSIS — C50911 Malignant neoplasm of unspecified site of right female breast: Secondary | ICD-10-CM

## 2019-11-26 DIAGNOSIS — C787 Secondary malignant neoplasm of liver and intrahepatic bile duct: Secondary | ICD-10-CM

## 2019-11-26 MED ORDER — FASLODEX 250 MG/5ML IM SOLN: 500.0000 mg | INTRAMUSCULAR | 12 refills | Status: DC

## 2019-11-26 NOTE — Progress Notes (Signed)
Patient has been approved for drug assistance by [facility] for Faslodex. The enrollment period is from 11/14/19-05/15/20 based on EOOP. First DOS covered is 12/04/19.

## 2019-11-27 ENCOUNTER — Ambulatory Visit: Payer: Medicare Other

## 2019-12-02 ENCOUNTER — Ambulatory Visit (HOSPITAL_COMMUNITY)
Admission: RE | Admit: 2019-12-02 | Discharge: 2019-12-02 | Disposition: A | Discharge status: Home / Self Care | Payer: Medicare Other | Source: Ambulatory Visit | Attending: Hematology and Oncology | Admitting: Hematology and Oncology

## 2019-12-02 ENCOUNTER — Inpatient Hospital Stay: Payer: Medicare Other

## 2019-12-02 ENCOUNTER — Inpatient Hospital Stay: Payer: Medicare Other | Attending: Hematology and Oncology

## 2019-12-02 ENCOUNTER — Other Ambulatory Visit: Payer: Self-pay

## 2019-12-02 DIAGNOSIS — Z170 Estrogen receptor positive status [ER+]: Secondary | ICD-10-CM | POA: Diagnosis not present

## 2019-12-02 DIAGNOSIS — C7951 Secondary malignant neoplasm of bone: Secondary | ICD-10-CM | POA: Diagnosis present

## 2019-12-02 DIAGNOSIS — C787 Secondary malignant neoplasm of liver and intrahepatic bile duct: Secondary | ICD-10-CM | POA: Diagnosis present

## 2019-12-02 DIAGNOSIS — Z5111 Encounter for antineoplastic chemotherapy: Secondary | ICD-10-CM | POA: Diagnosis not present

## 2019-12-02 DIAGNOSIS — C50911 Malignant neoplasm of unspecified site of right female breast: Secondary | ICD-10-CM | POA: Insufficient documentation

## 2019-12-02 DIAGNOSIS — Z79899 Other long term (current) drug therapy: Secondary | ICD-10-CM | POA: Diagnosis not present

## 2019-12-02 DIAGNOSIS — Z9013 Acquired absence of bilateral breasts and nipples: Secondary | ICD-10-CM | POA: Diagnosis not present

## 2019-12-02 DIAGNOSIS — C782 Secondary malignant neoplasm of pleura: Secondary | ICD-10-CM | POA: Diagnosis not present

## 2019-12-02 DIAGNOSIS — C7801 Secondary malignant neoplasm of right lung: Secondary | ICD-10-CM | POA: Diagnosis not present

## 2019-12-02 DIAGNOSIS — C50919 Malignant neoplasm of unspecified site of unspecified female breast: Secondary | ICD-10-CM

## 2019-12-02 DIAGNOSIS — Z7984 Long term (current) use of oral hypoglycemic drugs: Secondary | ICD-10-CM | POA: Diagnosis not present

## 2019-12-02 DIAGNOSIS — C7802 Secondary malignant neoplasm of left lung: Secondary | ICD-10-CM | POA: Diagnosis not present

## 2019-12-02 DIAGNOSIS — Z95828 Presence of other vascular implants and grafts: Secondary | ICD-10-CM

## 2019-12-02 DIAGNOSIS — C786 Secondary malignant neoplasm of retroperitoneum and peritoneum: Secondary | ICD-10-CM | POA: Insufficient documentation

## 2019-12-02 DIAGNOSIS — D649 Anemia, unspecified: Secondary | ICD-10-CM | POA: Diagnosis not present

## 2019-12-02 LAB — CMP (CANCER CENTER ONLY)
ALT: 6 U/L (ref 0–44)
AST: 22 U/L (ref 15–41)
Albumin: 2.8 g/dL — ABNORMAL LOW (ref 3.5–5.0)
Alkaline Phosphatase: 52 U/L (ref 38–126)
Anion gap: 8 (ref 5–15)
BUN: 26 mg/dL — ABNORMAL HIGH (ref 8–23)
CO2: 27 mmol/L (ref 22–32)
Calcium: 8 mg/dL — ABNORMAL LOW (ref 8.9–10.3)
Chloride: 106 mmol/L (ref 98–111)
Creatinine: 0.95 mg/dL (ref 0.44–1.00)
GFR, Est AFR Am: >60 mL/min (ref >60)
GFR, Estimated: 58 mL/min — ABNORMAL LOW (ref >60)
Glucose, Bld: 128 mg/dL — ABNORMAL HIGH (ref 70–99)
Potassium: 4.5 mmol/L (ref 3.5–5.1)
Sodium: 141 mmol/L (ref 135–145)
Total Bilirubin: 0.6 mg/dL (ref 0.3–1.2)
Total Protein: 6.3 g/dL — ABNORMAL LOW (ref 6.5–8.1)

## 2019-12-02 LAB — CBC WITH DIFFERENTIAL (CANCER CENTER ONLY)
Abs Immature Granulocytes: 0.01 10*3/uL (ref 0.00–0.07)
Basophils Absolute: 0 10*3/uL (ref 0.0–0.1)
Basophils Relative: 1 %
Eosinophils Absolute: 0.1 10*3/uL (ref 0.0–0.5)
Eosinophils Relative: 1 %
HCT: 33.2 % — ABNORMAL LOW (ref 36.0–46.0)
Hemoglobin: 10.3 g/dL — ABNORMAL LOW (ref 12.0–15.0)
Immature Granulocytes: 0 %
Lymphocytes Relative: 24 %
Lymphs Abs: 1.3 10*3/uL (ref 0.7–4.0)
MCH: 28 pg (ref 26.0–34.0)
MCHC: 31 g/dL (ref 30.0–36.0)
MCV: 90.2 fL (ref 80.0–100.0)
Monocytes Absolute: 0.5 10*3/uL (ref 0.1–1.0)
Monocytes Relative: 9 %
Neutro Abs: 3.4 10*3/uL (ref 1.7–7.7)
Neutrophils Relative %: 65 %
Platelet Count: 177 10*3/uL (ref 150–400)
RBC: 3.68 MIL/uL — ABNORMAL LOW (ref 3.87–5.11)
RDW: 16 % — ABNORMAL HIGH (ref 11.5–15.5)
WBC Count: 5.3 10*3/uL (ref 4.0–10.5)
nRBC: 0 % (ref 0.0–0.2)

## 2019-12-02 MED ORDER — SODIUM CHLORIDE (PF) 0.9 % IJ SOLN
INTRAMUSCULAR | Status: AC
  Filled 2019-12-02: qty 50

## 2019-12-02 MED ORDER — IOHEXOL 300 MG/ML  SOLN
100.0000 mL | Freq: Once | INTRAMUSCULAR | Status: AC | PRN
  Administered 2019-12-02: 100 mL via INTRAVENOUS

## 2019-12-02 MED ORDER — SODIUM CHLORIDE 0.9% FLUSH
10.0000 mL | INTRAVENOUS | Status: DC | PRN
  Administered 2019-12-02: 10 mL
  Filled 2019-12-02: qty 10

## 2019-12-02 MED ORDER — HEPARIN SOD (PORK) LOCK FLUSH 100 UNIT/ML IV SOLN
INTRAVENOUS | Status: AC
  Filled 2019-12-02: qty 5

## 2019-12-02 NOTE — Progress Notes (Signed)
Pt left accessed for CT. Power port used

## 2019-12-04 ENCOUNTER — Inpatient Hospital Stay: Payer: Medicare Other

## 2019-12-04 ENCOUNTER — Inpatient Hospital Stay (HOSPITAL_BASED_OUTPATIENT_CLINIC_OR_DEPARTMENT_OTHER): Payer: Medicare Other | Admitting: Hematology and Oncology

## 2019-12-04 ENCOUNTER — Other Ambulatory Visit: Payer: Self-pay

## 2019-12-04 VITALS — BP 144/74 | HR 71 | Temp 98.3°F | Resp 16 | Ht 63.5 in | Wt 161.0 lb

## 2019-12-04 DIAGNOSIS — C7951 Secondary malignant neoplasm of bone: Secondary | ICD-10-CM | POA: Diagnosis not present

## 2019-12-04 DIAGNOSIS — C50919 Malignant neoplasm of unspecified site of unspecified female breast: Secondary | ICD-10-CM

## 2019-12-04 DIAGNOSIS — Z5111 Encounter for antineoplastic chemotherapy: Secondary | ICD-10-CM | POA: Diagnosis not present

## 2019-12-04 DIAGNOSIS — C50911 Malignant neoplasm of unspecified site of right female breast: Secondary | ICD-10-CM | POA: Insufficient documentation

## 2019-12-04 DIAGNOSIS — Z7189 Other specified counseling: Secondary | ICD-10-CM | POA: Diagnosis not present

## 2019-12-04 DIAGNOSIS — C787 Secondary malignant neoplasm of liver and intrahepatic bile duct: Secondary | ICD-10-CM

## 2019-12-04 MED ORDER — ONDANSETRON HCL 8 MG PO TABS: 8.0000 mg | ORAL_TABLET | Freq: Two times a day (BID) | ORAL | 1 refills | Status: AC | PRN

## 2019-12-04 MED ORDER — LIDOCAINE-PRILOCAINE 2.5-2.5 % EX CREA: TOPICAL_CREAM | CUTANEOUS | 3 refills | Status: AC

## 2019-12-04 MED ORDER — PROCHLORPERAZINE MALEATE 10 MG PO TABS: 10.0000 mg | ORAL_TABLET | Freq: Four times a day (QID) | ORAL | 1 refills | Status: AC | PRN

## 2019-12-04 NOTE — Assessment & Plan Note (Signed)
Metastatic breast cancer: recently progressive in liver and bone, also pleura.  Faslodex + Ibrance beginning 03/31/2016-March 2020 stopped due to progression Exemestane with everolimus discontinued 08/29/2018 due to hyperglycemia Xeloda discontinued 04/08/2019 Enzalutamide started 04/15/2019 discontinued 07/08/2019 for progression Tamoxifen with Faslodex started 07/30/2019 discontinued 12/04/19 for progression  [facility] molecular testing: Androgen receptor positive, PDL 1 neg, MSI stable, NTRK1/2/3Neg, T MBB intermediate 9 mutations/Mb, ESR 1-, PI 3 CA negative, BRCA negative   Hospitalization for severe anemia hemoglobin of 2.6 received 4 units of PRBC, today's hemoglobin is 10.2  CT abdomen pelvis 12/02/2019: Progressive pleuroparenchymal, hepatic, peritoneal and osseous metastatic disease. Trace ascites.  Treatment plan:Discussed different options including Taxol and Doxil Counseling: Discussed both options and made a decision on Taxol Risk of hair loss, neuropathy, infusion reactions were discussed

## 2019-12-04 NOTE — Progress Notes (Signed)
DISCONTINUE ON PATHWAY REGIMEN - Breast     A cycle is every 21 days:     Eribulin mesylate   **Always confirm dose/schedule in your pharmacy ordering system**  REASON: Disease Progression PRIOR TREATMENT: [id number]: Eribulin 1.4 mg/m2 D1, 8 q21 Days TREATMENT RESPONSE: Progressive Disease (PD)  START OFF PATHWAY REGIMEN - Breast   [id number]:CMF (IV cyclophosphamide) q21 days:   A cycle is every 21 days:     Cyclophosphamide      Methotrexate      Fluorouracil   **Always confirm dose/schedule in your pharmacy ordering system**  Patient Characteristics: Distant Metastases or Locoregional Recurrent Disease - Unresected or Locally Advanced Unresectable Disease Progressing after Neoadjuvant and Local Therapies, HER2 Negative/Unknown/Equivocal, ER Positive, Chemotherapy, Third Line and Beyond, Prior or  Contraindicated Anthracycline and Prior Eribulin Therapeutic Status: Distant Metastases ER Status: Positive (+) HER2 Status: Negative (-) PR Status: Positive (+) Therapy Approach Indicated: Standard Chemotherapy/Endocrine Therapy Line of Therapy: Third Line and Beyond Intent of Therapy: Non-Curative / Palliative Intent, Discussed with Patient

## 2019-12-04 NOTE — Progress Notes (Signed)
Patient Care Team: [name], MD as PCP - General (Family Medicine)  DIAGNOSIS:    ICD-10-CM   1. Carcinoma of breast metastatic to bone, unspecified laterality ([place])  C50.919 CBC with Differential (Cancer Center Only)   C79.51 CMP (Cancer Center only)    lidocaine-prilocaine (EMLA) cream    ondansetron (ZOFRAN) 8 MG tablet    prochlorperazine (COMPAZINE) 10 MG tablet  2. 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 Goals of care, counseling/discussion  Z71.89 CBC with Differential ([place])    CMP ([place] only)    lidocaine-prilocaine (EMLA) cream    ondansetron (ZOFRAN) 8 MG tablet    prochlorperazine (COMPAZINE) 10 MG tablet    SUMMARY OF ONCOLOGIC HISTORY: Oncology History  Breast cancer metastasized to liver [facility])  1997 Initial Diagnosis   bilateral breast cancers 1997 treated with bilateral mastectomies and axillary node dissections, adjuvant chemotherapy and radiation.   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 BRCA reportedly normal.   08/25/2015 Relapse/Recurrence   new solitary liver lesion, biopsy showing metastatic ER PR + breast   09/25/2015 - 03/11/2016 Chemotherapy   Xeloda. Stopped due to progression of liver and bone metastases; pleural metastases   03/31/2016 - 07/19/2018 Anti-estrogen oral therapy   Foslodex started 03/31/16, currently monthly  Ibrance 160m 3 weeks on, 1 week off started 03/31/16 Xgeva started on 05/05/16 Letrozole daily added 08/01/17      03/20/2018 Genetic Testing   Genetic testing performed through [facility]'s Multi-Cancer reported out on 03/19/2018 showed no pathogenic mutations. The Multi-Cancer Panel offered by [facility] includes sequencing and/or deletion duplication testing of the following 91 genes: AIP, ALK, APC, ATM, AXIN2, BAP1, BARD1, BLM, BMPR1A, BRCA1, BRCA2, BRIP1, BUB1B, CASR, CDC73, CDH1, CDK4, CDKN1B, CDKN1C, CDKN2A, CEBPA, CEP57, CHEK2, CTNNA1, DICER1, DIS3L2, EGFR, ENG, EPCAM, FH, FLCN, GALNT12, GATA2, GPC3, GREM1, HOXB13, HRAS, KIT, MAX, MEN1, MET, MITF, MLH1, MLH3, MSH2, MSH3, MSH6, MUTYH, NBN, NF1, NF2, NTHL1, PALB2, PDGFRA, PHOX2B, PMS2, POLD1, POLE, POT1, PRKAR1A, PTCH1, PTEN, RAD50, RAD51C, RAD51D, RB1, RECQL4, RET, RNF43, RPS20, RUNX1, SDHA, SDHAF2, SDHB, SDHC, SDHD, SMAD4, SMARCA4, SMARCB1, SMARCE1, STK11, SUFU, TERC, TERT, TMEM127, TP53, TSC1, TSC2, VHL, WRN, WT1  A variant of uncertain significance (VUS) in a gene called MET was also noted. [id number] (p.Thr557Ala)   04/08/2018 Miscellaneous   [facility] molecular testing: Androgen receptor positive, PDL 1 neg, MSI stable, NTRK1/2/3Neg, T MBB intermediate 9 mutations/Mb, ESR 1-, PI 3 CA negative, BRCA negative   07/20/2018 - 08/30/2018 Anti-estrogen oral therapy   Exemestane with everolimus.  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   [place]  03/27/2016 Initial Diagnosis   Carcinoma of breast metastatic to bone ([place]   08/03/2017 - 01/31/2018 Chemotherapy   Foslodex started 03/31/16 monthly  Ibrance 172m 3 weeks on, 1 week off started 03/31/16 Daily letrozole added 08/01/17    12/09/2019 -  Chemotherapy   The patient had palonosetron (ALOXI) injection 0.25 mg, 0.25 mg, Intravenous,  Once, 0 of 8  cycles methotrexate (PF) chemo injection 72.5 mg, 40 mg/m2 = 72.5 mg, Intravenous,  Once, 0 of 8 cycles cyclophosphamide (CYTOXAN) 1,080 mg in sodium chloride 0.9 % 250 mL chemo infusion, 600 mg/m2 = 1,080 mg, Intravenous,  Once, 0 of 8 cycles fluorouracil (ADRUCIL) chemo injection 1,100 mg, 600 mg/m2 = 1,100 mg, Intravenous,  Once, 0 of 8 cycles  for chemotherapy treatment.    Breast cancer metastasized to liver, unspecified laterality [place]  03/27/2016 Initial Diagnosis   Breast cancer metastasized to liver, unspecified laterality ([place]   Carcin[place] of right breast [place] to liver 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      CHIEF COMPLIANT: Follow-up of metastatic breast cancer, review CT  INTERVAL HISTORY: Brittany DREWis a 74y.o. with above-mentioned history of metastatic breast cancerwith metastases to thebone,liver,and lungswhois currently on treatment withtamoxifen and Faslodex. CT CAP on 12/02/19 showed significant progression of metastatic disease with several bilateral pulmonary metastases increased in size, widespread liver metastases increased in size and number, progression of extensive peritoneal metastatic disease, widespread sclerotic osseous metastases increased in number and extent, new small dependent right pleural effusion, and new small right adrenal nodule is presumably metastatic. She presentsto the clinic  todayfor follow-up to discuss her scan and further treatment.    ALLERGIES:  is allergic to prednisolone, prednisone, other, and sulfa antibiotics.  MEDICATIONS:  Current Outpatient Medications  Medication Sig Dispense Refill  . ACCU-CHEK AVIVA PLUS test strip     . Accu-Chek FastClix Lancets MISC     . amiodarone (PACERONE) 200 MG tablet Take 1 tablet (200 mg total) by mouth daily. 90 tablet 3  . atorvastatin (LIPITOR) 20 MG tablet Take 1 tablet (20 mg total) by mouth daily at 6 PM. 45 tablet 9  . Calcium Carb-Cholecalciferol (CALCIUM 600+D3) 600-800 MG-UNIT TABS Take 1 tablet by mouth 2 (two) times daily.     . Cholecalciferol (VITAMIN D3) 2000 UNITS TABS Take 2,000 mg by mouth daily.    . FASLODEX 250 MG/5ML injection Inject 10 mLs (500 mg total) into the muscle every 30 (thirty) days. One injection each buttock over 1-2 minutes. Warm prior to use. 10 mL 12  . ferrous sulfate 325 (65 FE) MG tablet Take 325 mg by mouth daily as needed (energy).     . fish oil-omega-3 fatty acids 1000 MG capsule Take 1 g by mouth 2 (two) times daily.     . furosemide (LASIX) 20 MG tablet Take 1 tablet (20 mg total) by mouth daily. Reported on 05/07/2015 30 tablet 0  . gabapentin (NEURONTIN) 100 MG capsule Take 100 mg by mouth 2 (two) times daily.    . Ipratropium-Albuterol (COMBIVENT RESPIMAT) 20-100  MCG/ACT AERS respimat Inhale 1 puff into the lungs every 6 (six) hours. (Patient taking differently: Inhale 1 puff into the lungs every 6 (six) hours as needed for wheezing or shortness of breath. ) 4 g 6  . lidocaine-prilocaine (EMLA) cream Apply to affected area once 30 g 3  . Magnesium Oxide (MAG-OXIDE) 200 MG TABS Take 1 tablet (200 mg total) by mouth daily. (Patient taking differently: Take 200 mg by mouth at bedtime. ) 30 tablet 0  . metFORMIN (GLUCOPHAGE) 500 MG tablet Take 2 tablets (1,000 mg total) by mouth 2 (two) times daily with a meal. 120 tablet 0  . metoprolol succinate (TOPROL-XL) 25 MG 24 hr  tablet Take 1 tablet (25 mg total) by mouth daily. 90 tablet 3  . ondansetron (ZOFRAN) 8 MG tablet Take 1 tablet (8 mg total) by mouth 2 (two) times daily as needed for refractory nausea / vomiting. Start on day 3 after chemotherapy. 30 tablet 1  . pantoprazole (PROTONIX) 40 MG tablet Take 1 tablet (40 mg total) by mouth 2 (two) times daily before a meal. 60 tablet 6  . prochlorperazine (COMPAZINE) 10 MG tablet Take 1 tablet (10 mg total) by mouth every 6 (six) hours as needed (Nausea or vomiting). 30 tablet 1  . sodium chloride (OCEAN) 0.65 % SOLN nasal spray Place 1 spray into both nostrils as needed for congestion.    . tamoxifen (NOLVADEX) 20 MG tablet Take 1 tablet (20 mg total) by mouth daily. 90 tablet 3   No current facility-administered medications for this visit.    PHYSICAL EXAMINATION: ECOG PERFORMANCE STATUS: 1 - Symptomatic but completely ambulatory  Vitals:   12/04/19 1138  BP: (!) 144/74  Pulse: 71  Resp: 16  Temp: 98.3 F (36.8 C)  SpO2: 100%    LABORATORY DATA:  I have reviewed the data as listed CMP Latest Ref Rng & Units 12/02/2019 10/30/2019 10/01/2019  Glucose 70 - 99 mg/dL 128(H) 140(H) 159(H)  BUN 8 - 23 mg/dL 26(H) 29(H) 23  Creatinine 0.44 - 1.00 mg/dL 0.95 1.04(H) 1.09(H)  Sodium 135 - 145 mmol/L 141 145 142  Potassium 3.5 - 5.1 mmol/L 4.5 5.4(H) 5.3(H)  Chloride 98 - 111 mmol/L 106 104 101  CO2 22 - 32 mmol/L _0 Calcium 8.9 - 10.3 mg/dL 8.0(L) 9.1 8.6(L)  Total Protein 6.5 - 8.1 g/dL 6.3(L) 6.3(L) 6.5  Total Bilirubin 0.3 - 1.2 mg/dL 0.6 0.6 0.5  Alkaline Phos 38 - 126 U/L 52 59 58  AST 15 - 41 U/L _1 ALT 0 - 44 U/L _2 Lab Results  Component Value Date   WBC 5.3 12/02/2019   HGB 10.3 (L) 12/02/2019   HCT 33.2 (L) 12/02/2019   MCV 90.2 12/02/2019   PLT 177 12/02/2019   NEUTROABS 3.4 12/02/2019    ASSESSMENT & PLAN:  Breast cancer metastasized to liver [facility]) Metastatic breast cancer: recently progressive in liver and  bone, also pleura.  Faslodex + Ibrance beginning 03/31/2016-March 2020 stopped due to progression Exemestane with everolimus discontinued 08/29/2018 due to hyperglycemia Xeloda discontinued 04/08/2019 Enzalutamide started 04/15/2019 discontinued 07/08/2019 for progression Tamoxifen with Faslodex started 07/30/2019 discontinued 12/04/19 for progression  [facility] molecular testing: Androgen receptor positive, PDL 1 neg, MSI stable, NTRK1/2/3Neg, T MBB intermediate 9 mutations/Mb, ESR 1-, PI 3 CA negative, BRCA negative   Hospitalization for severe anemia hemoglobin of 2.6 received 4 units of PRBC, today's hemoglobin is 10.2  CT abdomen pelvis  12/02/2019: Progressive pleuroparenchymal, hepatic, peritoneal and osseous metastatic disease. Trace ascites.  Treatment plan:Discussed different options and recommended CMF every 3 weeks x8 cycles. We discussed the risks and benefits of CMF chemotherapy including the risk of cytopenias, anemia, hair thinning, nausea.    Goals of care discussion: I discussed at length with her about end-of-life issues.  She is not ready to accept any of this.  I offered a discussion with her husband and her daughter.  She does not want me to call them.  She is going to discuss with them herself.  I instructed her that if they would like to talk to me to have them reach out to me.  Patient understands that if this chemo does not work then we would consider hospice care.  Bone metastases: Switch her to [place] every 3 months  Return to clinic next Monday to start CMF chemo.  She already has a port.   Orders Placed This Encounter  Procedures  . CBC with Differential (Cancer Center Only)    Standing Status:   Standing    Number of Occurrences:   20    Standing Expiration Date:   12/03/2020  . CMP ([place] only)    Standing Status:   Standing    Number of Occurrences:   20    Standing Expiration Date:   12/03/2020   The patient has a good understanding of the  overall plan. she agrees with it. she will call with any problems that may develop before the next visit here.  Total time spent: 30 mins including face to face time and time spent for planning, charting and coordination of care  Nicholas Lose, MD 12/04/2019  I, Cloyde Reams Dorshimer, am acting as scribe for Dr. Nicholas Lose.  I have reviewed the above documentation for accuracy and completeness, and I agree with the above.

## 2019-12-06 NOTE — Progress Notes (Addendum)
Pharmacist Chemotherapy Monitoring - Initial Assessment    Anticipated start date: 12/09/2019   Regimen:  . Are orders appropriate based on the patient's diagnosis, regimen, and cycle? Yes . Does the plan date match the patient's scheduled date? Yes . Is the sequencing of drugs appropriate? Yes . Are the premedications appropriate for the patient's regimen? Yes - will clarify patient's allergy to steroids prior to administration.  . Prior Authorization for treatment is: Approved o If applicable, is the correct biosimilar selected based on the patient's insurance? not applicable  Organ Function and Labs: Marland Kitchen Are dose adjustments needed based on the patient's renal function, hepatic function, or hematologic function? No . Are appropriate labs ordered prior to the start of patient's treatment? Yes . Other organ system assessment, if indicated: N/A . The following baseline labs, if indicated, have been ordered: N/A  Dose Assessment: . Are the drug doses appropriate? Yes . Are the following correct: o Drug concentrations Yes o IV fluid compatible with drug Yes o Administration routes Yes o Timing of therapy Yes . If applicable, does the patient have documented access for treatment and/or plans for port-a-cath placement? yes . If applicable, have lifetime cumulative doses been properly documented and assessed? not applicable Lifetime Dose Tracking  No doses have been documented on this patient for the following tracked chemicals: Doxorubicin, Epirubicin, Idarubicin, Daunorubicin, Mitoxantrone, Bleomycin, Oxaliplatin, Carboplatin, Liposomal Doxorubicin  o   Toxicity Monitoring/Prevention: . The patient has the following take home antiemetics prescribed: Ondansetron and Prochlorperazine . The patient has the following take home medications prescribed: N/A . Medication allergies and previous infusion related reactions, if applicable, have been reviewed and addressed. Yes -- will clarify  patient's steroid allergy on day of treatment.  . The patient's current medication list has been assessed for drug-drug interactions with their chemotherapy regimen. Amiodarone may increase the pulmonary toxicity when in combination with cyclophosphamide and skin toxicity in combination with methotrexate. QTc interval may be prolonged with 5-FU.  Recommend close monitoring, no dose adjustments necessary.   Order Review: . Are the treatment plan orders signed? Yes . Is the patient scheduled to see a provider prior to their treatment? Yes  I verify that I have reviewed each item in the above checklist and answered each question accordingly.  Norwood Levo [facility] 12/06/2019 10:06 AM

## 2019-12-08 NOTE — Progress Notes (Signed)
Patient Care Team: [name], MD as PCP - General (Family Medicine)  DIAGNOSIS:    ICD-10-CM   1. 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 BRCA reportedly normal.   08/25/2015 Relapse/Recurrence   new solitary liver lesion, biopsy showing metastatic ER PR + breast   09/25/2015 - 03/11/2016 Chemotherapy   Xeloda. Stopped due to progression of liver and bone metastases; pleural metastases   03/31/2016 - 07/19/2018 Anti-estrogen oral therapy   Foslodex started 03/31/16, currently monthly  Ibrance 15m 3 weeks on, 1 week off started 03/31/16 Xgeva started on 05/05/16 Letrozole daily added 08/01/17    03/20/2018 Genetic Testing   Genetic testing performed through [facility]'s Multi-Cancer reported out on 03/19/2018 showed no pathogenic mutations. The Multi-Cancer Panel offered by [facility] includes sequencing and/or deletion duplication testing of the following 91 genes: AIP, ALK, APC, ATM, AXIN2, BAP1, BARD1, BLM, BMPR1A, BRCA1, BRCA2, BRIP1, BUB1B, CASR, CDC73, CDH1, CDK4, CDKN1B, CDKN1C, CDKN2A, CEBPA, CEP57, CHEK2, CTNNA1, DICER1, DIS3L2, EGFR, ENG, EPCAM, FH, FLCN, GALNT12, GATA2, GPC3, GREM1, HOXB13, HRAS, KIT, MAX, MEN1, MET, MITF, MLH1, MLH3, MSH2, MSH3, MSH6, MUTYH, NBN, NF1, NF2, NTHL1, PALB2, PDGFRA, PHOX2B, PMS2, POLD1, POLE, POT1, PRKAR1A, PTCH1, PTEN, RAD50, RAD51C, RAD51D, RB1, RECQL4,  RET, RNF43, RPS20, RUNX1, SDHA, SDHAF2, SDHB, SDHC, SDHD, SMAD4, SMARCA4, SMARCB1, SMARCE1, STK11, SUFU, TERC, TERT, TMEM127, TP53, TSC1, TSC2, VHL, WRN, WT1  A variant of uncertain significance (VUS) in a gene called MET was also noted. [id number] (p.Thr557Ala)   04/08/2018 Miscellaneous   [facility] molecular testing: Androgen receptor positive, PDL 1 neg, MSI stable, NTRK1/2/3Neg, T MBB intermediate 9 mutations/Mb, ESR 1-, PI 3 CA negative, BRCA negative   07/20/2018 - 08/30/2018 Anti-estrogen oral therapy   Exemestane with everolimus.  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   Carcin[place] of breast [place] to [place]  03/27/2016 Initial Diagnosis   Carcinoma of breast metastatic to bone ([place]   08/03/2017 - 01/31/2018 Chemotherapy   Foslodex started 03/31/16 monthly  Ibrance 1266m3 weeks on, 1 week off started 03/31/16 Daily letrozole added 08/01/17    12/09/2019 -  Chemotherapy   The patient had palonosetron (ALOXI) injection 0.25 mg, 0.25 mg, Intravenous,  Once, 0 of 8 cycles methotrexate (PF) chemo injection 72.5 mg, 40 mg/m2 = 72.5 mg, Intravenous,  Once, 0 of 8 cycles cyclophosphamide (CYTOXAN) 1,080 mg in sodium chloride 0.9 % 250 mL chemo infusion, 600 mg/m2 = 1,080 mg, Intravenous,  Once, 0 of 8 cycles fluorouracil (ADRUCIL) chemo injection 1,100 mg, 600 mg/m2 = 1,100 mg, Intravenous,  Once, 0 of 8 cycles  for chemotherapy treatment.    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     CHIEF COMPLIANT: Follow-up of metastatic breast cancer, cycle 1 CMF  INTERVAL HISTORY: Brittany Porter is a 74 y.o. with above-mentioned history of metastatic breast cancerwith metastases to thebone,liver,and lungswhois currently on treatment withCMF and Zometa. She presentsto the clinic todayfor cycle 1.  She is here today with her husband.  She is in a wheelchair.  She is ready to start her treatment.  ALLERGIES:  is allergic to prednisolone, prednisone, other, and sulfa antibiotics.  MEDICATIONS:  Current Outpatient Medications  Medication Sig Dispense Refill  . ACCU-CHEK AVIVA PLUS test strip     . Accu-Chek FastClix Lancets MISC     . amiodarone (PACERONE) 200 MG tablet Take 1 tablet (200 mg total) by mouth daily. 90 tablet 3  . atorvastatin (LIPITOR) 20 MG tablet Take 1 tablet (20 mg total) by mouth daily at 6 PM. 45 tablet 9  . Calcium Carb-Cholecalciferol (CALCIUM 600+D3) 600-800 MG-UNIT TABS Take 1 tablet by mouth 2 (two) times daily.     . Cholecalciferol (VITAMIN D3) 2000 UNITS TABS Take 2,000 mg by mouth daily.    . FASLODEX 250 MG/5ML injection Inject 10 mLs (500 mg total) into the muscle every 30 (thirty) days. One injection each buttock over 1-2 minutes. Warm prior to use. 10 mL 12  . ferrous sulfate 325 (65 FE) MG tablet Take 325 mg by mouth daily as needed (energy).     . fish oil-omega-3 fatty acids 1000 MG capsule Take 1 g by mouth 2 (two) times daily.     . furosemide (LASIX) 20 MG  tablet Take 1 tablet (20 mg total) by mouth daily. Reported on 05/07/2015 30 tablet 0  . gabapentin (NEURONTIN) 100 MG capsule Take 100 mg by mouth 2 (two) times daily.    . Ipratropium-Albuterol (COMBIVENT RESPIMAT) 20-100 MCG/ACT AERS respimat Inhale 1 puff into the lungs every 6 (six) hours. (Patient taking differently: Inhale 1 puff into the lungs every 6 (six) hours as needed for wheezing or shortness of breath. ) 4 g 6  . lidocaine-prilocaine (EMLA) cream Apply to affected area once 30 g 3  . Magnesium Oxide (MAG-OXIDE) 200 MG TABS Take 1 tablet (200 mg total) by mouth daily. (Patient taking differently: Take 200 mg by mouth at bedtime. ) 30 tablet 0  . metFORMIN (GLUCOPHAGE) 500 MG tablet Take 2 tablets (1,000 mg total) by mouth 2 (two) times daily with a meal. 120 tablet 0  . metoprolol succinate (TOPROL-XL) 25 MG 24 hr tablet Take 1 tablet (25 mg total) by mouth daily. 90 tablet 3  . ondansetron (ZOFRAN) 8 MG tablet Take 1 tablet (8 mg total) by mouth 2 (two) times daily as needed for refractory nausea / vomiting. Start on day 3 after chemotherapy. 30 tablet 1  .  pantoprazole (PROTONIX) 40 MG tablet Take 1 tablet (40 mg total) by mouth 2 (two) times daily before a meal. 60 tablet 6  . prochlorperazine (COMPAZINE) 10 MG tablet Take 1 tablet (10 mg total) by mouth every 6 (six) hours as needed (Nausea or vomiting). 30 tablet 1  . sodium chloride (OCEAN) 0.65 % SOLN nasal spray Place 1 spray into both nostrils as needed for congestion.    . tamoxifen (NOLVADEX) 20 MG tablet Take 1 tablet (20 mg total) by mouth daily. 90 tablet 3   No current facility-administered medications for this visit.    PHYSICAL EXAMINATION: ECOG PERFORMANCE STATUS: 1 - Symptomatic but completely ambulatory  Vitals:   12/09/19 1056  BP: (!) 129/73  Pulse: 70  Resp: 16  Temp: 98.2 F (36.8 C)  SpO2: 98%   Filed Weights   12/09/19 1056  Weight: 160 lb (72.6 kg)     LABORATORY DATA:  I have reviewed the  data as listed CMP Latest Ref Rng & Units 12/02/2019 10/30/2019 10/01/2019  Glucose 70 - 99 mg/dL 128(H) 140(H) 159(H)  BUN 8 - 23 mg/dL 26(H) 29(H) 23  Creatinine 0.44 - 1.00 mg/dL 0.95 1.04(H) 1.09(H)  Sodium 135 - 145 mmol/L 141 145 142  Potassium 3.5 - 5.1 mmol/L 4.5 5.4(H) 5.3(H)  Chloride 98 - 111 mmol/L 106 104 101  CO2 22 - 32 mmol/L '27 28 29  ' Calcium 8.9 - 10.3 mg/dL 8.0(L) 9.1 8.6(L)  Total Protein 6.5 - 8.1 g/dL 6.3(L) 6.3(L) 6.5  Total Bilirubin 0.3 - 1.2 mg/dL 0.6 0.6 0.5  Alkaline Phos 38 - 126 U/L 52 59 58  AST 15 - 41 U/L '22 23 22  ' ALT 0 - 44 U/L '6 7 7    ' Lab Results  Component Value Date   WBC 5.4 12/09/2019   HGB 10.5 (L) 12/09/2019   HCT 34.4 (L) 12/09/2019   MCV 90.1 12/09/2019   PLT 198 12/09/2019   NEUTROABS 3.6 12/09/2019    ASSESSMENT & PLAN:  Breast cancer metastasized to liver [facility]) Metastatic breast cancer: recently progressive in liver and bone, also pleura.  Faslodex + Ibrance beginning 03/31/2016-March 2020 stopped due to progression Exemestane with everolimus discontinued 08/29/2018 due to hyperglycemia Xeloda discontinued 04/08/2019 Enzalutamide started 04/15/2019 discontinued 07/08/2019 for progression Tamoxifen with Faslodex started 07/30/2019 discontinued 12/04/19 for progression  [facility] molecular testing: Androgen receptor positive, PDL 1 neg, MSI stable, NTRK1/2/3Neg, T MBB intermediate 9 mutations/Mb, ESR 1-, PI 3 CA negative, BRCA negative   Hospitalization for severe anemia hemoglobin of 2.6 received 4 units of PRBC, today's hemoglobin is10.2  CT abdomen pelvis 12/02/2019: Progressive pleuroparenchymal, hepatic, peritoneal and osseous metastatic disease. Trace ascites.  Treatment plan: CMF cycle 1 day 1 Goal of treatment: Palliation  Return to clinic in 3 weeks for cycle 2    No orders of the defined types were placed in this encounter.  The patient has a good understanding of the overall plan. she agrees with it. she will  call with any problems that may develop before the next visit here.  Total time spent: 30 mins including face to face time and time spent for planning, charting and coordination of care  Nicholas Lose, MD 12/09/2019  I, Cloyde Reams Dorshimer, am acting as scribe for Dr. Nicholas Lose.  I have reviewed the above documentation for accuracy and completeness, and I agree with the above.

## 2019-12-09 ENCOUNTER — Inpatient Hospital Stay: Payer: Medicare Other

## 2019-12-09 ENCOUNTER — Other Ambulatory Visit: Payer: Self-pay

## 2019-12-09 ENCOUNTER — Inpatient Hospital Stay (HOSPITAL_BASED_OUTPATIENT_CLINIC_OR_DEPARTMENT_OTHER): Payer: Medicare Other | Admitting: Hematology and Oncology

## 2019-12-09 VITALS — BP 129/73 | HR 70 | Temp 98.2°F | Resp 16 | Ht 63.5 in | Wt 160.0 lb

## 2019-12-09 DIAGNOSIS — C787 Secondary malignant neoplasm of liver and intrahepatic bile duct: Secondary | ICD-10-CM

## 2019-12-09 DIAGNOSIS — C50919 Malignant neoplasm of unspecified site of unspecified female breast: Secondary | ICD-10-CM

## 2019-12-09 DIAGNOSIS — Z7189 Other specified counseling: Secondary | ICD-10-CM

## 2019-12-09 DIAGNOSIS — C50911 Malignant neoplasm of unspecified site of right female breast: Secondary | ICD-10-CM | POA: Diagnosis not present

## 2019-12-09 DIAGNOSIS — Z5111 Encounter for antineoplastic chemotherapy: Secondary | ICD-10-CM | POA: Diagnosis not present

## 2019-12-09 LAB — CBC WITH DIFFERENTIAL (CANCER CENTER ONLY)
Abs Immature Granulocytes: 0.02 10*3/uL (ref 0.00–0.07)
Basophils Absolute: 0 10*3/uL (ref 0.0–0.1)
Basophils Relative: 1 %
Eosinophils Absolute: 0 10*3/uL (ref 0.0–0.5)
Eosinophils Relative: 1 %
HCT: 34.4 % — ABNORMAL LOW (ref 36.0–46.0)
Hemoglobin: 10.5 g/dL — ABNORMAL LOW (ref 12.0–15.0)
Immature Granulocytes: 0 %
Lymphocytes Relative: 24 %
Lymphs Abs: 1.3 10*3/uL (ref 0.7–4.0)
MCH: 27.5 pg (ref 26.0–34.0)
MCHC: 30.5 g/dL (ref 30.0–36.0)
MCV: 90.1 fL (ref 80.0–100.0)
Monocytes Absolute: 0.5 10*3/uL (ref 0.1–1.0)
Monocytes Relative: 9 %
Neutro Abs: 3.6 10*3/uL (ref 1.7–7.7)
Neutrophils Relative %: 65 %
Platelet Count: 198 10*3/uL (ref 150–400)
RBC: 3.82 MIL/uL — ABNORMAL LOW (ref 3.87–5.11)
RDW: 16.2 % — ABNORMAL HIGH (ref 11.5–15.5)
WBC Count: 5.4 10*3/uL (ref 4.0–10.5)
nRBC: 0 % (ref 0.0–0.2)

## 2019-12-09 LAB — CMP (CANCER CENTER ONLY)
ALT: 7 U/L (ref 0–44)
AST: 21 U/L (ref 15–41)
Albumin: 2.8 g/dL — ABNORMAL LOW (ref 3.5–5.0)
Alkaline Phosphatase: 50 U/L (ref 38–126)
Anion gap: 11 (ref 5–15)
BUN: 29 mg/dL — ABNORMAL HIGH (ref 8–23)
CO2: 24 mmol/L (ref 22–32)
Calcium: 8.2 mg/dL — ABNORMAL LOW (ref 8.9–10.3)
Chloride: 106 mmol/L (ref 98–111)
Creatinine: 1.19 mg/dL — ABNORMAL HIGH (ref 0.44–1.00)
GFR, Est AFR Am: 51 mL/min — ABNORMAL LOW (ref >60)
GFR, Estimated: 44 mL/min — ABNORMAL LOW (ref >60)
Glucose, Bld: 139 mg/dL — ABNORMAL HIGH (ref 70–99)
Potassium: 4.5 mmol/L (ref 3.5–5.1)
Sodium: 141 mmol/L (ref 135–145)
Total Bilirubin: 0.6 mg/dL (ref 0.3–1.2)
Total Protein: 6.3 g/dL — ABNORMAL LOW (ref 6.5–8.1)

## 2019-12-09 MED ORDER — HEPARIN SOD (PORK) LOCK FLUSH 100 UNIT/ML IV SOLN
250.0000 [IU] | Freq: Once | INTRAVENOUS | Status: DC | PRN
  Filled 2019-12-09: qty 5

## 2019-12-09 MED ORDER — FLUOROURACIL CHEMO INJECTION 2.5 GM/50ML
600.0000 mg/m2 | Freq: Once | INTRAVENOUS | Status: AC
  Administered 2019-12-09: 1100 mg via INTRAVENOUS
  Filled 2019-12-09: qty 22

## 2019-12-09 MED ORDER — HEPARIN SOD (PORK) LOCK FLUSH 100 UNIT/ML IV SOLN
500.0000 [IU] | Freq: Once | INTRAVENOUS | Status: AC | PRN
  Administered 2019-12-09: 500 [IU]
  Filled 2019-12-09: qty 5

## 2019-12-09 MED ORDER — METHOTREXATE SODIUM (PF) CHEMO INJECTION 250 MG/10ML
40.0000 mg/m2 | Freq: Once | INTRAMUSCULAR | Status: AC
  Administered 2019-12-09: 72.5 mg via INTRAVENOUS
  Filled 2019-12-09: qty 2.9

## 2019-12-09 MED ORDER — PALONOSETRON HCL INJECTION 0.25 MG/5ML
0.2500 mg | Freq: Once | INTRAVENOUS | Status: AC
  Administered 2019-12-09: 0.25 mg via INTRAVENOUS

## 2019-12-09 MED ORDER — SODIUM CHLORIDE 0.9% FLUSH
10.0000 mL | INTRAVENOUS | Status: DC | PRN
  Administered 2019-12-09: 10 mL
  Filled 2019-12-09: qty 10

## 2019-12-09 MED ORDER — SODIUM CHLORIDE 0.9 % IV SOLN
Freq: Once | INTRAVENOUS | Status: AC
  Filled 2019-12-09: qty 250

## 2019-12-09 MED ORDER — SODIUM CHLORIDE 0.9 % IV SOLN
600.0000 mg/m2 | Freq: Once | INTRAVENOUS | Status: AC
  Administered 2019-12-09: 1080 mg via INTRAVENOUS
  Filled 2019-12-09: qty 54

## 2019-12-09 MED ORDER — PALONOSETRON HCL INJECTION 0.25 MG/5ML
INTRAVENOUS | Status: AC
  Filled 2019-12-09: qty 5

## 2019-12-09 MED ORDER — SODIUM CHLORIDE 0.9 % IV SOLN
10.0000 mg | Freq: Once | INTRAVENOUS | Status: AC
  Administered 2019-12-09: 10 mg via INTRAVENOUS
  Filled 2019-12-09: qty 10

## 2019-12-09 NOTE — Assessment & Plan Note (Signed)
Metastatic breast cancer: recently progressive in liver and bone, also pleura.  Faslodex + Ibrance beginning 03/31/2016-March 2020 stopped due to progression Exemestane with everolimus discontinued 08/29/2018 due to hyperglycemia Xeloda discontinued 04/08/2019 Enzalutamide started 04/15/2019 discontinued 07/08/2019 for progression Tamoxifen with Faslodex started 07/30/2019 discontinued 12/04/19 for progression  [facility] molecular testing: Androgen receptor positive, PDL 1 neg, MSI stable, NTRK1/2/3Neg, T MBB intermediate 9 mutations/Mb, ESR 1-, PI 3 CA negative, BRCA negative   Hospitalization for severe anemia hemoglobin of 2.6 received 4 units of PRBC, today's hemoglobin is10.2  CT abdomen pelvis 12/02/2019: Progressive pleuroparenchymal, hepatic, peritoneal and osseous metastatic disease. Trace ascites.  Treatment plan: CMF cycle 1 day 1 Goal of treatment: Palliation  Return to clinic in 1 week for toxicity check

## 2019-12-09 NOTE — Progress Notes (Signed)
Allergy Clarification:  Per patient, steroids cause blood glucose to be elevated. Anaphylaxis was related to the sulfa drug not the steroid. RN to update allergy list  Larene Beach, PharmD

## 2019-12-09 NOTE — Patient Instructions (Signed)
[facility] Discharge Instructions for Patients Receiving Chemotherapy  Today you received the following chemotherapy agents: cyclophosphamide, methotrexate, fluorouracil.  To help prevent nausea and vomiting after your treatment, we encourage you to take your nausea medication as prescribed by your physician.   If you develop nausea and vomiting that is not controlled by your nausea medication, call the clinic.   BELOW ARE SYMPTOMS THAT SHOULD BE REPORTED IMMEDIATELY:  *FEVER GREATER THAN 100.5 F  *CHILLS WITH OR WITHOUT FEVER  NAUSEA AND VOMITING THAT IS NOT CONTROLLED WITH YOUR NAUSEA MEDICATION  *UNUSUAL SHORTNESS OF BREATH  *UNUSUAL BRUISING OR BLEEDING  TENDERNESS IN MOUTH AND THROAT WITH OR WITHOUT PRESENCE OF ULCERS  *URINARY PROBLEMS  *BOWEL PROBLEMS  UNUSUAL RASH Items with * indicate a potential emergency and should be followed up as soon as possible.  Feel free to call the clinic should you have any questions or concerns. The clinic phone number is [phone].  Please show the [facility] at check-in to the Emergency Department and triage nurse.  Cyclophosphamide Injection What is this medicine? CYCLOPHOSPHAMIDE (sye kloe FOSS fa mide) is a chemotherapy drug. It slows the growth of cancer cells. This medicine is used to treat many types of cancer like lymphoma, myeloma, leukemia, breast cancer, and ovarian cancer, to name a few. This medicine may be used for other purposes; ask your health care provider or pharmacist if you have questions. COMMON BRAND NAME(S): Cytoxan, Neosar What should I tell my health care provider before I take this medicine? They need to know if you have any of these conditions:  heart disease  history of irregular heartbeat  infection  kidney disease  liver disease  low blood counts, like white cells, platelets, or red blood cells  on hemodialysis  recent or ongoing radiation therapy  scarring or  thickening of the lungs  trouble passing urine  an unusual or allergic reaction to cyclophosphamide, other medicines, foods, dyes, or preservatives  pregnant or trying to get pregnant  breast-feeding How should I use this medicine? This drug is usually given as an injection into a vein or muscle or by infusion into a vein. It is administered in a hospital or clinic by a specially trained health care professional. Talk to your pediatrician regarding the use of this medicine in children. Special care may be needed. Overdosage: If you think you have taken too much of this medicine contact a poison control center or emergency room at once. NOTE: This medicine is only for you. Do not share this medicine with others. What if I miss a dose? It is important not to miss your dose. Call your doctor or health care professional if you are unable to keep an appointment. What may interact with this medicine?  amphotericin B  azathioprine  certain antivirals for HIV or hepatitis  certain medicines for blood pressure, heart disease, irregular heart beat  certain medicines that treat or prevent blood clots like warfarin  certain other medicines for cancer  cyclosporine  etanercept  indomethacin  medicines that relax muscles for surgery  medicines to increase blood counts  metronidazole This list may not describe all possible interactions. Give your health care provider a list of all the medicines, herbs, non-prescription drugs, or dietary supplements you use. Also tell them if you smoke, drink alcohol, or use illegal drugs. Some items may interact with your medicine. What should I watch for while using this medicine? Your condition will be monitored carefully while you  are receiving this medicine. You may need blood work done while you are taking this medicine. Drink water or other fluids as directed. Urinate often, even at night. Some products may contain alcohol. Ask your health care  professional if this medicine contains alcohol. Be sure to tell all health care professionals you are taking this medicine. Certain medicines, like metronidazole and disulfiram, can cause an unpleasant reaction when taken with alcohol. The reaction includes flushing, headache, nausea, vomiting, sweating, and increased thirst. The reaction can last from 30 minutes to several hours. Do not become pregnant while taking this medicine or for 1 year after stopping it. Women should inform their health care professional if they wish to become pregnant or think they might be pregnant. Men should not father a child while taking this medicine and for 4 months after stopping it. There is potential for serious side effects to an unborn child. Talk to your health care professional for more information. Do not breast-feed an infant while taking this medicine or for 1 week after stopping it. This medicine has caused ovarian failure in some women. This medicine may make it more difficult to get pregnant. Talk to your health care professional if you are concerned about your fertility. This medicine has caused decreased sperm counts in some men. This may make it more difficult to father a child. Talk to your health care professional if you are concerned about your fertility. Call your health care professional for advice if you get a fever, chills, or sore throat, or other symptoms of a cold or flu. Do not treat yourself. This medicine decreases your body's ability to fight infections. Try to avoid being around people who are sick. Avoid taking medicines that contain aspirin, acetaminophen, ibuprofen, naproxen, or ketoprofen unless instructed by your health care professional. These medicines may hide a fever. Talk to your health care professional about your risk of cancer. You may be more at risk for certain types of cancer if you take this medicine. If you are going to need surgery or other procedure, tell your health care  professional that you are using this medicine. Be careful brushing or flossing your teeth or using a toothpick because you may get an infection or bleed more easily. If you have any dental work done, tell your dentist you are receiving this medicine. What side effects may I notice from receiving this medicine? Side effects that you should report to your doctor or health care professional as soon as possible:  allergic reactions like skin rash, itching or hives, swelling of the face, lips, or tongue  breathing problems  nausea, vomiting  signs and symptoms of bleeding such as bloody or black, tarry stools; red or dark brown urine; spitting up blood or brown material that looks like coffee grounds; red spots on the skin; unusual bruising or bleeding from the eyes, gums, or nose  signs and symptoms of heart failure like fast, irregular heartbeat, sudden weight gain; swelling of the ankles, feet, hands  signs and symptoms of infection like fever; chills; cough; sore throat; pain or trouble passing urine  signs and symptoms of kidney injury like trouble passing urine or change in the amount of urine  signs and symptoms of liver injury like dark yellow or brown urine; general ill feeling or flu-like symptoms; light-colored stools; loss of appetite; nausea; right upper belly pain; unusually weak or tired; yellowing of the eyes or skin Side effects that usually do not require medical attention (report to your doctor  or health care professional if they continue or are bothersome):  confusion  decreased hearing  diarrhea  facial flushing  hair loss  headache  loss of appetite  missed menstrual periods  signs and symptoms of low red blood cells or anemia such as unusually weak or tired; feeling faint or lightheaded; falls  skin discoloration This list may not describe all possible side effects. Call your doctor for medical advice about side effects. You may report side effects to FDA at  1-800-FDA-1088. Where should I keep my medicine? This drug is given in a hospital or clinic and will not be stored at home. NOTE: This sheet is a summary. It may not cover all possible information. If you have questions about this medicine, talk to your doctor, pharmacist, or health care provider.  2020 [employer]vier/Gold Standard (2019-02-04 09:53:29)  Methotrexate injection What is this medicine? METHOTREXATE (METH oh TREX ate) is a chemotherapy drug used to treat cancer including breast cancer, leukemia, and lymphoma. This medicine can also be used to treat psoriasis and certain kinds of arthritis. This medicine may be used for other purposes; ask your health care provider or pharmacist if you have questions. What should I tell my health care provider before I take this medicine? They need to know if you have any of these conditions:  fluid in the stomach area or lungs  if you often drink alcohol  infection or immune system problems  kidney disease  liver disease  low blood counts, like low white cell, platelet, or red cell counts  lung disease  radiation therapy  stomach ulcers  ulcerative colitis  an unusual or allergic reaction to methotrexate, other medicines, foods, dyes, or preservatives  pregnant or trying to get pregnant  breast-feeding How should I use this medicine? This medicine is for infusion into a vein or for injection into muscle or into the spinal fluid (whichever applies). It is usually given by a health care professional in a hospital or clinic setting. In rare cases, you might get this medicine at home. You will be taught how to give this medicine. Use exactly as directed. Take your medicine at regular intervals. Do not take your medicine more often than directed. If this medicine is used for arthritis or psoriasis, it should be taken weekly, NOT daily. It is important that you put your used needles and syringes in a special sharps container. Do not put  them in a trash can. If you do not have a sharps container, call your pharmacist or healthcare provider to get one. Talk to your pediatrician regarding the use of this medicine in children. While this drug may be prescribed for children as young as 2 years for selected conditions, precautions do apply. Overdosage: If you think you have taken too much of this medicine contact a poison control center or emergency room at once. NOTE: This medicine is only for you. Do not share this medicine with others. What if I miss a dose? It is important not to miss your dose. Call your doctor or health care professional if you are unable to keep an appointment. If you give yourself the medicine and you miss a dose, talk with your doctor or health care professional. Do not take double or extra doses. What may interact with this medicine? This medicine may interact with the following medications:  acitretin  aspirin or aspirin-like medicines including salicylates  azathioprine  certain antibiotics like chloramphenicol, penicillin, tetracycline  certain medicines for stomach problems like esomeprazole, omeprazole,  pantoprazole  cyclosporine  gold  hydroxychloroquine  live virus vaccines  mercaptopurine  NSAIDs, medicines for pain and inflammation, like ibuprofen or naproxen  other cytotoxic agents  penicillamine  phenylbutazone  phenytoin  probenacid  retinoids such as isotretinoin and tretinoin  steroid medicines like prednisone or cortisone  sulfonamides like sulfasalazine and trimethoprim/sulfamethoxazole  theophylline This list may not describe all possible interactions. Give your health care provider a list of all the medicines, herbs, non-prescription drugs, or dietary supplements you use. Also tell them if you smoke, drink alcohol, or use illegal drugs. Some items may interact with your medicine. What should I watch for while using this medicine? Avoid alcoholic drinks. In  some cases, you may be given additional medicines to help with side effects. Follow all directions for their use. This medicine can make you more sensitive to the sun. Keep out of the sun. If you cannot avoid being in the sun, wear protective clothing and use sunscreen. Do not use sun lamps or tanning beds/booths. You may get drowsy or dizzy. Do not drive, use machinery, or do anything that needs mental alertness until you know how this medicine affects you. Do not stand or sit up quickly, especially if you are an older patient. This reduces the risk of dizzy or fainting spells. You may need blood work done while you are taking this medicine. Call your doctor or health care professional for advice if you get a fever, chills or sore throat, or other symptoms of a cold or flu. Do not treat yourself. This drug decreases your body's ability to fight infections. Try to avoid being around people who are sick. This medicine may increase your risk to bruise or bleed. Call your doctor or health care professional if you notice any unusual bleeding. Check with your doctor or health care professional if you get an attack of severe diarrhea, nausea and vomiting, or if you sweat a lot. The loss of too much body fluid can make it dangerous for you to take this medicine. Talk to your doctor about your risk of cancer. You may be more at risk for certain types of cancers if you take this medicine. Both men and women must use effective birth control with this medicine. Do not become pregnant while taking this medicine or until at least 1 normal menstrual cycle has occurred after stopping it. Women should inform their doctor if they wish to become pregnant or think they might be pregnant. Men should not father a child while taking this medicine and for 3 months after stopping it. There is a potential for serious side effects to an unborn child. Talk to your health care professional or pharmacist for more information. Do not  breast-feed an infant while taking this medicine. What side effects may I notice from receiving this medicine? Side effects that you should report to your doctor or health care professional as soon as possible:  allergic reactions like skin rash, itching or hives, swelling of the face, lips, or tongue  back pain  breathing problems or shortness of breath  confusion  diarrhea  dry, nonproductive cough  low blood counts - this medicine may decrease the number of white blood cells, red blood cells and platelets. You may be at increased risk of infections and bleeding  mouth sores  redness, blistering, peeling or loosening of the skin, including inside the mouth  seizures  severe headaches  signs of infection - fever or chills, cough, sore throat, pain or difficulty passing  urine  signs and symptoms of bleeding such as bloody or black, tarry stools; red or dark-brown urine; spitting up blood or brown material that looks like coffee grounds; red spots on the skin; unusual bruising or bleeding from the eye, gums, or nose  signs and symptoms of kidney injury like trouble passing urine or change in the amount of urine  signs and symptoms of liver injury like dark yellow or brown urine; general ill feeling or flu-like symptoms; light-colored stools; loss of appetite; nausea; right upper belly pain; unusually weak or tired; yellowing of the eyes or skin  stiff neck  vomiting Side effects that usually do not require medical attention (report to your doctor or health care professional if they continue or are bothersome):  dizziness  hair loss  headache  stomach pain  upset stomach This list may not describe all possible side effects. Call your doctor for medical advice about side effects. You may report side effects to FDA at 1-800-FDA-1088. Where should I keep my medicine? If you are using this medicine at home, you will be instructed on how to store this medicine. Throw away  any unused medicine after the expiration date on the label. NOTE: This sheet is a summary. It may not cover all possible information. If you have questions about this medicine, talk to your doctor, pharmacist, or health care provider.  2020 [employer]vier/Gold Standard (2016-12-22 13:31:42)  Fluorouracil, 5-FU injection What is this medicine? FLUOROURACIL, 5-FU (flure oh YOOR a sil) is a chemotherapy drug. It slows the growth of cancer cells. This medicine is used to treat many types of cancer like breast cancer, colon or rectal cancer, pancreatic cancer, and stomach cancer. This medicine may be used for other purposes; ask your health care provider or pharmacist if you have questions. COMMON BRAND NAME(S): Adrucil What should I tell my health care provider before I take this medicine? They need to know if you have any of these conditions:  blood disorders  dihydropyrimidine dehydrogenase (DPD) deficiency  infection (especially a virus infection such as chickenpox, cold sores, or herpes)  kidney disease  liver disease  malnourished, poor nutrition  recent or ongoing radiation therapy  an unusual or allergic reaction to fluorouracil, other chemotherapy, other medicines, foods, dyes, or preservatives  pregnant or trying to get pregnant  breast-feeding How should I use this medicine? This drug is given as an infusion or injection into a vein. It is administered in a hospital or clinic by a specially trained health care professional. Talk to your pediatrician regarding the use of this medicine in children. Special care may be needed. Overdosage: If you think you have taken too much of this medicine contact a poison control center or emergency room at once. NOTE: This medicine is only for you. Do not share this medicine with others. What if I miss a dose? It is important not to miss your dose. Call your doctor or health care professional if you are unable to keep an appointment. What may  interact with this medicine?  allopurinol  cimetidine  dapsone  digoxin  hydroxyurea  leucovorin  levamisole  medicines for seizures like ethotoin, fosphenytoin, phenytoin  medicines to increase blood counts like filgrastim, pegfilgrastim, sargramostim  medicines that treat or prevent blood clots like warfarin, enoxaparin, and dalteparin  methotrexate  metronidazole  pyrimethamine  some other chemotherapy drugs like busulfan, cisplatin, estramustine, vinblastine  trimethoprim  trimetrexate  vaccines Talk to your doctor or health care professional before taking any of these  medicines:  acetaminophen  aspirin  ibuprofen  ketoprofen  naproxen This list may not describe all possible interactions. Give your health care provider a list of all the medicines, herbs, non-prescription drugs, or dietary supplements you use. Also tell them if you smoke, drink alcohol, or use illegal drugs. Some items may interact with your medicine. What should I watch for while using this medicine? Visit your doctor for checks on your progress. This drug may make you feel generally unwell. This is not uncommon, as chemotherapy can affect healthy cells as well as cancer cells. Report any side effects. Continue your course of treatment even though you feel ill unless your doctor tells you to stop. In some cases, you may be given additional medicines to help with side effects. Follow all directions for their use. Call your doctor or health care professional for advice if you get a fever, chills or sore throat, or other symptoms of a cold or flu. Do not treat yourself. This drug decreases your body's ability to fight infections. Try to avoid being around people who are sick. This medicine may increase your risk to bruise or bleed. Call your doctor or health care professional if you notice any unusual bleeding. Be careful brushing and flossing your teeth or using a toothpick because you may get an  infection or bleed more easily. If you have any dental work done, tell your dentist you are receiving this medicine. Avoid taking products that contain aspirin, acetaminophen, ibuprofen, naproxen, or ketoprofen unless instructed by your doctor. These medicines may hide a fever. Do not become pregnant while taking this medicine. Women should inform their doctor if they wish to become pregnant or think they might be pregnant. There is a potential for serious side effects to an unborn child. Talk to your health care professional or pharmacist for more information. Do not breast-feed an infant while taking this medicine. Men should inform their doctor if they wish to father a child. This medicine may lower sperm counts. Do not treat diarrhea with over the counter products. Contact your doctor if you have diarrhea that lasts more than 2 days or if it is severe and watery. This medicine can make you more sensitive to the sun. Keep out of the sun. If you cannot avoid being in the sun, wear protective clothing and use sunscreen. Do not use sun lamps or tanning beds/booths. What side effects may I notice from receiving this medicine? Side effects that you should report to your doctor or health care professional as soon as possible:  allergic reactions like skin rash, itching or hives, swelling of the face, lips, or tongue  low blood counts - this medicine may decrease the number of white blood cells, red blood cells and platelets. You may be at increased risk for infections and bleeding.  signs of infection - fever or chills, cough, sore throat, pain or difficulty passing urine  signs of decreased platelets or bleeding - bruising, pinpoint red spots on the skin, black, tarry stools, blood in the urine  signs of decreased red blood cells - unusually weak or tired, fainting spells, lightheadedness  breathing problems  changes in vision  chest pain  mouth sores  nausea and vomiting  pain, swelling,  redness at site where injected  pain, tingling, numbness in the hands or feet  redness, swelling, or sores on hands or feet  stomach pain  unusual bleeding Side effects that usually do not require medical attention (report to your doctor or health  care professional if they continue or are bothersome):  changes in finger or toe nails  diarrhea  dry or itchy skin  hair loss  headache  loss of appetite  sensitivity of eyes to the light  stomach upset  unusually teary eyes This list may not describe all possible side effects. Call your doctor for medical advice about side effects. You may report side effects to FDA at 1-800-FDA-1088. Where should I keep my medicine? This drug is given in a hospital or clinic and will not be stored at home. NOTE: This sheet is a summary. It may not cover all possible information. If you have questions about this medicine, talk to your doctor, pharmacist, or health care provider.  2020 [employer]/Gold Standard (2007-09-05 13:53:16)

## 2019-12-10 ENCOUNTER — Telehealth: Payer: Self-pay | Admitting: *Deleted

## 2019-12-10 ENCOUNTER — Telehealth: Payer: Self-pay | Admitting: Hematology and Oncology

## 2019-12-10 NOTE — Telephone Encounter (Signed)
No 7/26 los. No changes made to pt's schedule.

## 2019-12-18 ENCOUNTER — Encounter (INDEPENDENT_AMBULATORY_CARE_PROVIDER_SITE_OTHER): Payer: Medicare Other | Admitting: Ophthalmology

## 2019-12-24 ENCOUNTER — Emergency Department (HOSPITAL_COMMUNITY)
Admission: EM | Admit: 2019-12-24 | Discharge: 2019-12-24 | Disposition: A | Discharge status: Home / Self Care | Payer: Medicare Other | Attending: Emergency Medicine | Admitting: Emergency Medicine

## 2019-12-24 ENCOUNTER — Other Ambulatory Visit: Payer: Self-pay

## 2019-12-24 ENCOUNTER — Emergency Department (HOSPITAL_COMMUNITY): Payer: Medicare Other

## 2019-12-24 ENCOUNTER — Encounter (HOSPITAL_COMMUNITY): Payer: Self-pay | Admitting: Emergency Medicine

## 2019-12-24 DIAGNOSIS — R531 Weakness: Secondary | ICD-10-CM | POA: Insufficient documentation

## 2019-12-24 DIAGNOSIS — Z853 Personal history of malignant neoplasm of breast: Secondary | ICD-10-CM | POA: Diagnosis not present

## 2019-12-24 DIAGNOSIS — Z7984 Long term (current) use of oral hypoglycemic drugs: Secondary | ICD-10-CM | POA: Insufficient documentation

## 2019-12-24 DIAGNOSIS — R0602 Shortness of breath: Secondary | ICD-10-CM | POA: Insufficient documentation

## 2019-12-24 DIAGNOSIS — R0600 Dyspnea, unspecified: Secondary | ICD-10-CM

## 2019-12-24 DIAGNOSIS — E119 Type 2 diabetes mellitus without complications: Secondary | ICD-10-CM | POA: Diagnosis not present

## 2019-12-24 DIAGNOSIS — C50919 Malignant neoplasm of unspecified site of unspecified female breast: Secondary | ICD-10-CM

## 2019-12-24 LAB — COMPREHENSIVE METABOLIC PANEL
ALT: 9 U/L (ref 0–44)
AST: 26 U/L (ref 15–41)
Albumin: 2.4 g/dL — ABNORMAL LOW (ref 3.5–5.0)
Alkaline Phosphatase: 41 U/L (ref 38–126)
Anion gap: 13 (ref 5–15)
BUN: 36 mg/dL — ABNORMAL HIGH (ref 8–23)
CO2: 22 mmol/L (ref 22–32)
Calcium: 6.8 mg/dL — ABNORMAL LOW (ref 8.9–10.3)
Chloride: 101 mmol/L (ref 98–111)
Creatinine, Ser: 1.53 mg/dL — ABNORMAL HIGH (ref 0.44–1.00)
GFR calc Af Amer: 38 mL/min — ABNORMAL LOW (ref >60)
GFR calc non Af Amer: 33 mL/min — ABNORMAL LOW (ref >60)
Glucose, Bld: 175 mg/dL — ABNORMAL HIGH (ref 70–99)
Potassium: 5.4 mmol/L — ABNORMAL HIGH (ref 3.5–5.1)
Sodium: 136 mmol/L (ref 135–145)
Total Bilirubin: 1.4 mg/dL — ABNORMAL HIGH (ref 0.3–1.2)
Total Protein: 6.4 g/dL — ABNORMAL LOW (ref 6.5–8.1)

## 2019-12-24 LAB — CBC WITH DIFFERENTIAL/PLATELET
Abs Immature Granulocytes: 0.04 10*3/uL (ref 0.00–0.07)
Basophils Absolute: 0 10*3/uL (ref 0.0–0.1)
Basophils Relative: 0 %
Blasts: 2 %
Eosinophils Absolute: 0 10*3/uL (ref 0.0–0.5)
Eosinophils Relative: 0 %
HCT: 29.6 % — ABNORMAL LOW (ref 36.0–46.0)
Hemoglobin: 9.4 g/dL — ABNORMAL LOW (ref 12.0–15.0)
Lymphocytes Relative: 15 %
Lymphs Abs: 0.3 10*3/uL — ABNORMAL LOW (ref 0.7–4.0)
MCH: 28.6 pg (ref 26.0–34.0)
MCHC: 31.8 g/dL (ref 30.0–36.0)
MCV: 90 fL (ref 80.0–100.0)
Monocytes Absolute: 0.2 10*3/uL (ref 0.1–1.0)
Monocytes Relative: 11 %
Myelocytes: 2 %
Neutro Abs: 1.3 10*3/uL — ABNORMAL LOW (ref 1.7–7.7)
Neutrophils Relative %: 70 %
Other: 0 %
Platelets: 164 10*3/uL (ref 150–400)
RBC: 3.29 MIL/uL — ABNORMAL LOW (ref 3.87–5.11)
RDW: 16 % — ABNORMAL HIGH (ref 11.5–15.5)
WBC: 1.9 10*3/uL — ABNORMAL LOW (ref 4.0–10.5)
nRBC: 1 % — ABNORMAL HIGH (ref 0.0–0.2)

## 2019-12-24 LAB — TYPE AND SCREEN
ABO/RH(D): O POS
Antibody Screen: NEGATIVE

## 2019-12-24 LAB — BRAIN NATRIURETIC PEPTIDE: B Natriuretic Peptide: 99.8 pg/mL (ref 0.0–100.0)

## 2019-12-24 LAB — TROPONIN I (HIGH SENSITIVITY)
Troponin I (High Sensitivity): 19 ng/L — ABNORMAL HIGH (ref <18)
Troponin I (High Sensitivity): 20 ng/L — ABNORMAL HIGH (ref <18)

## 2019-12-24 LAB — PATHOLOGIST SMEAR REVIEW

## 2019-12-24 MED ORDER — SODIUM CHLORIDE (PF) 0.9 % IJ SOLN
INTRAMUSCULAR | Status: AC
  Filled 2019-12-24: qty 50

## 2019-12-24 MED ORDER — HEPARIN SOD (PORK) LOCK FLUSH 100 UNIT/ML IV SOLN
500.0000 [IU] | Freq: Once | INTRAVENOUS | Status: AC
  Administered 2019-12-24: 500 [IU]
  Filled 2019-12-24: qty 5

## 2019-12-24 MED ORDER — ALBUTEROL SULFATE (2.5 MG/3ML) 0.083% IN NEBU: 5.0000 mg | INHALATION_SOLUTION | Freq: Once | RESPIRATORY_TRACT | Status: DC

## 2019-12-24 MED ORDER — IOHEXOL 350 MG/ML SOLN
100.0000 mL | Freq: Once | INTRAVENOUS | Status: AC | PRN
  Administered 2019-12-24: 45 mL via INTRAVENOUS

## 2019-12-24 NOTE — ED Triage Notes (Signed)
Arrives C/C SOB that started this morning, denies chest pain, N/V/D. Chemo trt 2 weeks ago.

## 2019-12-24 NOTE — ED Provider Notes (Signed)
[facility] DEPT Provider Note   CSN: [id number] Arrival date & time: 12/24/19  9024     History No chief complaint on file.   Brittany Porter is a 74 y.o. female.  Patient is a 74 year old female with history of metastatic breast cancer currently undergoing chemotherapy, diabetes, hyperlipidemia, and prior admission for symptomatic anemia with hemoglobin of 2.6.  She presents today for evaluation of weakness and shortness of breath.  This started several days ago while she was on vacation at the beach with family.  She noticed that she was winded and short of breath with ambulating up the stairs.  It has worsened since then.  She denies any black or bloody stools.  She denies any fevers or chills.  She had an admission in 2020 for symptomatic anemia with hemoglobin of 2.6.  The cause of her anemia was unable to be determined.  The history is provided by the patient.       Past Medical History:  Diagnosis Date  . Arthritis   . Breast cancer ([place])   . Diabetes mellitus   . Endometrial carcinoma ([place]) 10/2008  . Family history of breast cancer   . Family history of colon cancer   . Family history of skin cancer   . Fluid overload   . History of radiation therapy 04/13/2009, 04/16/2009, 04/27/2009, 05/07/2009, 05/18/2009   3000 cGy to proximal vagina  . Hyperlipidemia   . Iron deficiency   . Melanoma ([place]) 2010   rt arm and back  . Ovarian cancer ([place])   . Pleural effusion 04/2010    Patient Active Problem List   Diagnosis Date Noted  . Carcinoma of right breast metastatic to liver ([place]) 12/04/2019  . Branch retinal vein occlusion with macular edema of left eye 09/23/2019  . Vitreomacular adhesion of left eye 09/23/2019  . Moderate nonproliferative diabetic retinopathy of left eye with macular edema associated with type 2 diabetes mellitus ([place]) 09/23/2019  . Port-A-Cath in place 09/03/2019  . Chronic respiratory failure with hypoxia ([place])  05/06/2019  . Dyspnea 05/06/2019  . Symptomatic anemia 12/28/2018  . Lactic acidosis 12/28/2018  . Healthcare-associated pneumonia 12/28/2018  . Goals of care, counseling/discussion 09/24/2018  . Atrial fibrillation and flutter ([place]) 06/11/2018  . Genetic testing 03/21/2018  . Family history of breast cancer   . Family history of colon cancer   . Family history of skin cancer   . Carcinoma of breast metastatic to bone ([place]) 03/27/2016  . History of ovarian cancer 03/27/2016  . History of endometrial cancer 03/27/2016  . History of melanoma 03/27/2016  . Poor venous access 03/27/2016  . Encounter for antineoplastic chemotherapy 10/02/2015  . Breast cancer metastasized to multiple sites ([place]) 10/02/2015  . Breast cancer metastasized to liver ([place]) 09/27/2015  . Liver lesion, right lobe 09/06/2015  . Capsulitis 07/23/2015  . DDD (degenerative disc disease), lumbar 06/27/2015  . Ovarian CA, unspecified laterality ([place]) 05/08/2015  . Osteoarthritis of ankle and foot 04/01/2015  . Morbid obesity with BMI of 40.0-44.9, adult ([place]) 03/29/2015  . Bilateral malignant neoplasm of breast in female [facility]) 03/29/2015  . Breast cancer metastasized to pleura ([place]) 12/11/2014  . Obesity (BMI 30-39.9) 12/11/2014  . 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 High risk medication use 12/11/2014  . Bilateral breast cancer ([place]) 09/25/2013  . DM2 (diabetes mellitus, type 2) ([place]) 01/21/2012  . Hyperlipidemia 01/21/2012  . Melanoma of upper back excluding scapular region ([place]) 09/22/2011  . Ovarian cancer ([place])  08/09/2011  . Uterine carcinoma ([place]) 08/09/2011    Past Surgical History:  Procedure Laterality Date  . ABDOMINAL HYSTERECTOMY  10/2008  . APPENDECTOMY    . CATARACT EXTRACTION Right   . CATARACT EXTRACTION Left   . ESOPHAGOGASTRODUODENOSCOPY (EGD) WITH PROPOFOL N/A 12/30/2018   Procedure: ESOPHAGOGASTRODUODENOSCOPY (EGD) WITH PROPOFOL;  Surgeon: Ronald Lobo, MD;  Location: [facility];   Service: Endoscopy;  Laterality: N/A;  . INSERTION OF MESH  05/28/2012   Procedure: INSERTION OF MESH;  Surgeon: Adin Hector, MD;  Location: [facility];  Service: General;  Laterality: N/A;  . IR IMAGING GUIDED PORT INSERTION  07/23/2019  . IR US GUIDE BX ASP/DRAIN  07/23/2019  . KNEE ARTHROSCOPY  04/2010   Right knee  . LAPAROSCOPIC LYSIS OF ADHESIONS  05/28/2012   Procedure: LAPAROSCOPIC LYSIS OF ADHESIONS;  Surgeon: Adin Hector, MD;  Location: [facility];  Service: General;  Laterality: N/A;  . MASTECTOMY  1997   Bilateral with lymph nodes  . Melanoma removal  02/2009, 07/2009  . VENTRAL HERNIA REPAIR  05/28/2012   Procedure: LAPAROSCOPIC VENTRAL HERNIA;  Surgeon: Adin Hector, MD;  Location: [facility];  Service: General;  Laterality: N/A;     OB History   No obstetric history on file.     Family History  Problem Relation Age of Onset  . Stroke Mother   . Cancer Father        'voice box'  . Colon cancer Sister        dx >50  . Heart attack Brother   . Skin cancer Brother 78  . Cancer Sister        primary site unk, dx >50  . Diabetes Brother   . Stroke Brother   . Stroke Sister   . ALS Sister   . Colon cancer Sister 104  . Dementia Sister   . Breast cancer Sister        dx >5-, met to brain    Social History   Tobacco Use  . Smoking status: Never Smoker  . Smokeless tobacco: Never Used  Substance Use Topics  . Alcohol use: No  . Drug use: No    Home Medications Prior to Admission medications   Medication Sig Start Date End Date Taking? Authorizing Provider  ACCU-CHEK AVIVA PLUS test strip  05/15/19   [provider]  Accu-Chek FastClix Lancets [place]  05/15/19   [provider]  amiodarone (PACERONE) 200 MG tablet Take 1 tablet (200 mg total) by mouth daily. 06/28/19   Elouise Munroe, MD  atorvastatin (LIPITOR) 20 MG tablet Take 1 tablet (20 mg total) by mouth daily at 6 PM. 04/24/19 11/13/19  Elouise Munroe, MD  Calcium Carb-Cholecalciferol  (CALCIUM 600+D3) 600-800 MG-UNIT TABS Take 1 tablet by mouth 2 (two) times daily.     [provider]  Cholecalciferol (VITAMIN D3) 2000 UNITS TABS Take 2,000 mg by mouth daily.    [provider]  ferrous sulfate 325 (65 FE) MG tablet Take 325 mg by mouth daily as needed (energy).     [provider]  fish oil-omega-3 fatty acids 1000 MG capsule Take 1 g by mouth 2 (two) times daily.     [provider]  furosemide (LASIX) 20 MG tablet Take 1 tablet (20 mg total) by mouth daily. Reported on 05/07/2015 01/03/19   Alma Friendly, MD  gabapentin (NEURONTIN) 100 MG capsule Take 100 mg by mouth 2 (two) times daily. 07/27/18  [provider]  Ipratropium-Albuterol (COMBIVENT RESPIMAT) 20-100 MCG/ACT AERS respimat Inhale 1 puff into the lungs every 6 (six) hours. Patient taking differently: Inhale 1 puff into the lungs every 6 (six) hours as needed for wheezing or shortness of breath.  11/27/18   Margaretha Seeds, MD  lidocaine-prilocaine (EMLA) cream Apply to affected area once 12/04/19   Nicholas Lose, MD  Magnesium Oxide (MAG-OXIDE) 200 MG TABS Take 1 tablet (200 mg total) by mouth daily. Patient taking differently: Take 200 mg by mouth at bedtime.  06/06/18   Isla Pence, MD  metFORMIN (GLUCOPHAGE) 500 MG tablet Take 2 tablets (1,000 mg total) by mouth 2 (two) times daily with a meal. 01/03/19 11/13/19  Alma Friendly, MD  metoprolol succinate (TOPROL-XL) 25 MG 24 hr tablet Take 1 tablet (25 mg total) by mouth daily. 10/15/18   Elouise Munroe, MD  ondansetron (ZOFRAN) 8 MG tablet Take 1 tablet (8 mg total) by mouth 2 (two) times daily as needed for refractory nausea / vomiting. Start on day 3 after chemotherapy. 12/04/19   Nicholas Lose, MD  pantoprazole (PROTONIX) 40 MG tablet Take 1 tablet (40 mg total) by mouth 2 (two) times daily before a meal. 05/15/19 11/13/19  Lendon Colonel, NP  prochlorperazine (COMPAZINE) 10 MG tablet Take 1 tablet  (10 mg total) by mouth every 6 (six) hours as needed (Nausea or vomiting). 12/04/19   Nicholas Lose, MD  sodium chloride (OCEAN) 0.65 % SOLN nasal spray Place 1 spray into both nostrils as needed for congestion.    [provider]    Allergies    Prednisolone, Other, and Sulfa antibiotics  Review of Systems   Review of Systems  All other systems reviewed and are negative.   Physical Exam Updated Vital Signs BP 100/83 (BP Location: Left Arm)   Pulse 98   Temp 98.6 F (37 C) (Oral)   Resp (!) 36   Ht 5' 3.5" (1.613 m)   Wt 72 kg   SpO2 99%   BMI 27.68 kg/m   Physical Exam Vitals and nursing note reviewed.  Constitutional:      General: She is not in acute distress.    Appearance: She is well-developed. She is not diaphoretic.     Comments: Patient is a 74 year old female in no acute distress.  She is awake, alert, and appropriate.  She does appear somewhat pale.  HENT:     Head: Normocephalic and atraumatic.  Cardiovascular:     Rate and Rhythm: Normal rate and regular rhythm.     Heart sounds: No murmur heard.  No friction rub. No gallop.   Pulmonary:     Effort: Pulmonary effort is normal. No respiratory distress.     Breath sounds: Normal breath sounds. No wheezing.  Abdominal:     General: Bowel sounds are normal. There is no distension.     Palpations: Abdomen is soft.     Tenderness: There is no abdominal tenderness.  Musculoskeletal:        General: No swelling or tenderness. Normal range of motion.     Cervical back: Normal range of motion and neck supple.     Right lower leg: No edema.     Left lower leg: No edema.  Skin:    General: Skin is warm and dry.  Neurological:     Mental Status: She is alert and oriented to person, place, and time.     Cranial Nerves: No cranial nerve deficit.  ED Results / Procedures / Treatments   Labs (all labs ordered are listed, but only abnormal results are displayed) Labs Reviewed  COMPREHENSIVE METABOLIC  PANEL  CBC WITH DIFFERENTIAL/PLATELET  BRAIN NATRIURETIC PEPTIDE  TYPE AND SCREEN  TROPONIN I (HIGH SENSITIVITY)    EKG EKG Interpretation  Date/Time:  Tuesday December 24 2019 10:07:16 EDT Ventricular Rate:  104 PR Interval:    QRS Duration: 89 QT Interval:  323 QTC Calculation: 425 R Axis:   4 Text Interpretation: Sinus tachycardia Multiple ventricular premature complexes No significant change since 11/13/2019 Confirmed by Veryl Speak [phone]) on 12/24/2019 10:38:37 AM   Radiology DG Chest 2 View  Result Date: 12/24/2019 CLINICAL DATA:  Metastatic carcinoma. EXAM: CHEST - 2 VIEW COMPARISON:  CT abdomen chest 12/02/2019 FINDINGS: Normal cardiac silhouette. Nodules in the LEFT upper lobe again noted. Dense consolidation in the LEFT lower lobe relates to mass seen on comparison CT. RIGHT lung is clear.  Sclerotic metastasis in the spine. IMPRESSION: 1. No clear interval change. No acute pulmonary parenchymal abnormality. 2. LEFT lung lower lobe mass and nodules compared to recent CT. 3. Sclerotic skeletal metastasis. Electronically Signed   By: Suzy Bouchard M.D.   On: 12/24/2019 10:25    Procedures Procedures (including critical care time)  Medications Ordered in ED Medications - No data to display  ED Course  I have reviewed the triage vital signs and the nursing notes.  Pertinent labs & imaging results that were available during my care of the patient were reviewed by me and considered in my medical decision making (see chart for details).    MDM Rules/Calculators/A&P  Patient is a 74 year old female presenting with complaints of shortness of breath.  She has history of metastatic breast cancer.  She also recently had a trip to the beach headset in the car for several hours.  Patient's work-up today shows no evidence for pulmonary embolism.  Her vitals here have been stable and oxygen saturations of 94% on room air.  There is no evidence for congestive heart failure or  other acute cardiac process.  At this point, patient seems appropriate for discharge.  She has an appointment in the next few days with her oncologist.  She is to return in the ER if symptoms worsen.  Final Clinical Impression(s) / ED Diagnoses Final diagnoses:  None    Rx / DC Orders ED Discharge Orders    None       Veryl Speak, MD 12/24/19 1426

## 2019-12-24 NOTE — Discharge Instructions (Addendum)
Continue medications as previously prescribed.  Follow-up with your primary doctor and oncologist as scheduled, and return to the ER if symptoms significantly worsen or change in the meantime.

## 2019-12-25 ENCOUNTER — Encounter (INDEPENDENT_AMBULATORY_CARE_PROVIDER_SITE_OTHER): Payer: Medicare Other | Admitting: Ophthalmology

## 2019-12-30 ENCOUNTER — Other Ambulatory Visit: Payer: Self-pay

## 2019-12-30 ENCOUNTER — Encounter: Payer: Self-pay | Admitting: Internal Medicine

## 2019-12-30 ENCOUNTER — Ambulatory Visit: Payer: Medicare Other | Admitting: Internal Medicine

## 2019-12-30 VITALS — BP 104/72 | HR 89 | Ht 63.5 in | Wt 159.0 lb

## 2019-12-30 DIAGNOSIS — I10 Essential (primary) hypertension: Secondary | ICD-10-CM

## 2019-12-30 DIAGNOSIS — D6869 Other thrombophilia: Secondary | ICD-10-CM

## 2019-12-30 DIAGNOSIS — R0609 Other forms of dyspnea: Secondary | ICD-10-CM

## 2019-12-30 DIAGNOSIS — I2510 Atherosclerotic heart disease of native coronary artery without angina pectoris: Secondary | ICD-10-CM | POA: Diagnosis not present

## 2019-12-30 DIAGNOSIS — R0600 Dyspnea, unspecified: Secondary | ICD-10-CM | POA: Diagnosis not present

## 2019-12-30 DIAGNOSIS — I480 Paroxysmal atrial fibrillation: Secondary | ICD-10-CM

## 2019-12-30 DIAGNOSIS — I483 Typical atrial flutter: Secondary | ICD-10-CM

## 2019-12-30 DIAGNOSIS — C50919 Malignant neoplasm of unspecified site of unspecified female breast: Secondary | ICD-10-CM

## 2019-12-30 DIAGNOSIS — I5042 Chronic combined systolic (congestive) and diastolic (congestive) heart failure: Secondary | ICD-10-CM

## 2019-12-30 DIAGNOSIS — C7951 Secondary malignant neoplasm of bone: Secondary | ICD-10-CM

## 2019-12-30 DIAGNOSIS — R5383 Other fatigue: Secondary | ICD-10-CM

## 2019-12-30 NOTE — Progress Notes (Signed)
Cardiology Office Note:    Date:  12/30/2019   ID:  Brittany Porter, DOB [birth year], MRN [id number]  PCP:  Brittany Ada, MD  Cardiologist:  No primary care provider on file.  Electrophysiologist:  None   Referring MD: Brittany Ada, MD   Chief Complaint: no energy  History of Present Illness:    Brittany Porter is a 74 y.o. female with a history of breast cancer now with liver, bone and pleural mets, now on chemotherapy for palliation. Also has a history of endometrial CA, melanoma, and ovarian cancer. Cardiovascular history includes aflutter RVR, no longer on AC due to severely reduced hemoglobin. She also has systolic HF with ejection fraction of 45-50% and hypokinesis of the inferolateral myocardium. I do note coronary artery calcifications on a previous PET scan. We have attempted to obtain a CT coronary angiogram for [name] in the past given reduced EF but no ischemia on nuc in 2019, raising concern for balanced ischemia. This study was limited by poor perfusion of contrast and lack of opacification of the coronary arteries. A repeat study was attempted and unfortunately the same issue occurred.   Recent ER visit last week for weakness and SOB, she noticed this while on vacation with her family at the beach. There were no ramps at their residence, and she was extremely winded with minimal exertion, up only a few stairs. Previous admission for hemoglobin 2.6 without definite etiology. Xarelto discontinued. Current Hgb 9. CTPE negative for PE. I have independently reviewed these images and note LAD and RCA calcifications. No acute findings on ER evaluation and was recommended to follow up with cardiology and oncology.  She has no chest pain or palpitations. No PND, or orthopnea. Mild LE swelling. No syncope.  Past Medical History:  Diagnosis Date  . Arthritis   . Breast cancer ([place])   . Diabetes mellitus   . Endometrial carcinoma ([place]) 10/2008  . Family history of breast cancer   .  Family history of colon cancer   . Family history of skin cancer   . Fluid overload   . History of radiation therapy 04/13/2009, 04/16/2009, 04/27/2009, 05/07/2009, 05/18/2009   3000 cGy to proximal vagina  . Hyperlipidemia   . Iron deficiency   . Melanoma ([place]) 2010   rt arm and back  . Ovarian cancer ([place])   . Pleural effusion 04/2010    Past Surgical History:  Procedure Laterality Date  . ABDOMINAL HYSTERECTOMY  10/2008  . APPENDECTOMY    . CATARACT EXTRACTION Right   . CATARACT EXTRACTION Left   . ESOPHAGOGASTRODUODENOSCOPY (EGD) WITH PROPOFOL N/A 12/30/2018   Procedure: ESOPHAGOGASTRODUODENOSCOPY (EGD) WITH PROPOFOL;  Surgeon: Ronald Lobo, MD;  Location: [facility];  Service: Endoscopy;  Laterality: N/A;  . INSERTION OF MESH  05/28/2012   Procedure: INSERTION OF MESH;  Surgeon: Adin Hector, MD;  Location: [facility];  Service: General;  Laterality: N/A;  . IR IMAGING GUIDED PORT INSERTION  07/23/2019  . IR US GUIDE BX ASP/DRAIN  07/23/2019  . KNEE ARTHROSCOPY  04/2010   Right knee  . LAPAROSCOPIC LYSIS OF ADHESIONS  05/28/2012   Procedure: LAPAROSCOPIC LYSIS OF ADHESIONS;  Surgeon: Adin Hector, MD;  Location: [facility];  Service: General;  Laterality: N/A;  . MASTECTOMY  1997   Bilateral with lymph nodes  . Melanoma removal  02/2009, 07/2009  . VENTRAL HERNIA REPAIR  05/28/2012   Procedure: LAPAROSCOPIC VENTRAL HERNIA;  Surgeon: Adin Hector, MD;  Location: [facility];  Service: General;  Laterality: N/A;    Current Medications: Current Meds  Medication Sig  . ACCU-CHEK AVIVA PLUS test strip   . Accu-Chek FastClix Lancets [place]   . acetaminophen (TYLENOL) 500 MG tablet Take 1,000 mg by mouth 2 (two) times daily as needed for mild pain or headache.  Marland Kitchen amiodarone (PACERONE) 200 MG tablet Take 1 tablet (200 mg total) by mouth daily.  . Calcium Carb-Cholecalciferol (CALCIUM 600+D3) 600-800 MG-UNIT TABS Take 1 tablet by mouth 2 (two) times daily.   . Cholecalciferol (VITAMIN D3)  2000 UNITS TABS Take 2,000 mg by mouth daily.  . fish oil-omega-3 fatty acids 1000 MG capsule Take 1 g by mouth 2 (two) times daily.   . furosemide (LASIX) 20 MG tablet Take 1 tablet (20 mg total) by mouth daily. Reported on 05/07/2015 (Patient taking differently: Take 20 mg by mouth daily as needed for fluid. Reported on 05/07/2015)  . gabapentin (NEURONTIN) 100 MG capsule Take 100 mg by mouth 2 (two) times daily.  Marland Kitchen ibuprofen (ADVIL) 200 MG tablet Take 400 mg by mouth every 6 (six) hours as needed for headache or mild pain.  . Ipratropium-Albuterol (COMBIVENT RESPIMAT) 20-100 MCG/ACT AERS respimat Inhale 1 puff into the lungs every 6 (six) hours.  . lidocaine-prilocaine (EMLA) cream Apply to affected area once (Patient taking differently: Apply 1 application topically as needed (port access). )  . Magnesium Oxide (MAG-OXIDE) 200 MG TABS Take 1 tablet (200 mg total) by mouth daily. (Patient taking differently: Take 200 mg by mouth at bedtime. )  . metoprolol succinate (TOPROL-XL) 25 MG 24 hr tablet Take 1 tablet (25 mg total) by mouth daily.  . ondansetron (ZOFRAN) 8 MG tablet Take 1 tablet (8 mg total) by mouth 2 (two) times daily as needed for refractory nausea / vomiting. Start on day 3 after chemotherapy.  . prochlorperazine (COMPAZINE) 10 MG tablet Take 1 tablet (10 mg total) by mouth every 6 (six) hours as needed (Nausea or vomiting).     Allergies:   Prednisolone, Other, and Sulfa antibiotics   Social History   Socioeconomic History  . Marital status: Married    Spouse name: Not on file  . Number of children: Not on file  . Years of education: Not on file  . Highest education level: Not on file  Occupational History  . Not on file  Tobacco Use  . Smoking status: Never Smoker  . Smokeless tobacco: Never Used  Substance and Sexual Activity  . Alcohol use: No  . Drug use: No  . Sexual activity: Yes  Other Topics Concern  . Not on file  Social History Narrative  . Not on file     Social Determinants of Health   Financial Resource Strain:   . Difficulty of Paying Living Expenses:   Food Insecurity:   . Worried About Charity fundraiser in the Last Year:   . Arboriculturist in the Last Year:   Transportation Needs:   . Film/video editor (Medical):   Marland Kitchen Lack of Transportation (Non-Medical):   Physical Activity:   . Days of Exercise per Week:   . Minutes of Exercise per Session:   Stress:   . Feeling of Stress :   Social Connections:   . Frequency of Communication with Friends and Family:   . Frequency of Social Gatherings with Friends and Family:   . Attends Religious Services:   . Active Member of Clubs or Organizations:   . Attends Club  or Organization Meetings:   Marland Kitchen Marital Status:      Family History: The patient's family history includes ALS in her sister; Breast cancer in her sister; Cancer in her father and sister; Colon cancer in her sister; Colon cancer (age of onset: 71) in her sister; Dementia in her sister; Diabetes in her brother; Heart attack in her brother; Skin cancer (age of onset: 63) in her brother; Stroke in her brother, mother, and sister.  ROS:   Please see the history of present illness.    All other systems reviewed and are negative.  EKGs/Labs/Other Studies Reviewed:    The following studies were reviewed today:  EKG:  NSR  I have independently reviewed the images from CTPE 12/24/19.  Recent Labs: 11/13/2019: TSH 2.750 12/24/2019: ALT 9; B Natriuretic Peptide 99.8; BUN 36; Creatinine, Ser 1.53; Hemoglobin 9.4; Platelets 164; Potassium 5.4; Sodium 136  Recent Lipid Panel    Component Value Date/Time   CHOL 167 08/09/2018 1114   TRIG 161 (H) 08/09/2018 1114   HDL 61 08/09/2018 1114   CHOLHDL 2.7 08/09/2018 1114   VLDL 32 08/09/2018 1114   LDLCALC 74 08/09/2018 1114    Physical Exam:    VS:  BP 104/72   Pulse 89   Ht 5' 3.5" (1.613 m)   Wt 159 lb (72.1 kg)   SpO2 96%   BMI 27.72 kg/m     Wt Readings from  Last 5 Encounters:  12/30/19 159 lb (72.1 kg)  12/24/19 158 lb 11.7 oz (72 kg)  12/09/19 160 lb (72.6 kg)  12/04/19 161 lb (73 kg)  11/13/19 164 lb (74.4 kg)     Constitutional: No acute distress Eyes: sclera non-icteric, normal conjunctiva and lids ENMT: normal dentition, moist mucous membranes Cardiovascular: regular rhythm, normal rate, no murmurs. S1 and S2 normal. Radial pulses normal bilaterally. No jugular venous distention.  Respiratory: clear to auscultation bilaterally GI : normal bowel sounds, soft and nontender. No distention.   MSK: extremities warm, well perfused. Trace edema.  NEURO: grossly nonfocal exam, moves all extremities. PSYCH: alert and oriented x 3, normal mood and affect.   ASSESSMENT:    1. Fatigue, unspecified type   2. Dyspnea on exertion   3. Chronic combined systolic and diastolic heart failure ([place])   4. Coronary artery disease involving native coronary artery of native heart without angina pectoris   5. Typical atrial flutter ([place])   6. Secondary hypercoagulable state ([place])   7. Essential hypertension   8. Carcinoma of breast metastatic to bone, unspecified laterality ([place])    PLAN:    Fatigue - EF felt to be normal in 12/29/2018 during hospitalization. Given worsening dyspnea on exertion and fatigue with previously reduced EF and active chemotherapy, we will repeat an echocardiogram, last study 1 year ago.   Atrial flutter- currently in sinus rhythm. Continue amiodarone and metoprolol.   HTN - BP low normal, no presyncope. Continue furosemide, metoprolol.   HLD - mildly elevated triglycerides. Lipids otherwise stable. Continue atorvastatin 20 mg daily.   Total time of encounter: 30 minutes total time of encounter, including 20 minutes spent in face-to-face patient care on the date of this encounter. This time includes coordination of care and counseling regarding above mentioned problem list. Remainder of non-face-to-face time involved  reviewing chart documents/testing relevant to the patient encounter and documentation in the medical record. I have independently reviewed documentation from referring provider.   Cherlynn Kaiser, MD [place]  [facility]    Medication  Adjustments/Labs and Tests Ordered: Current medicines are reviewed at length with the patient today.  Concerns regarding medicines are outlined above.  Orders Placed This Encounter  Procedures  . EKG 12-Lead  . ECHOCARDIOGRAM COMPLETE   No orders of the defined types were placed in this encounter.   Patient Instructions  Medication Instructions:  CONTINUE WITH CURRENT MEDICATIONS. NO CHANGES.  *If you need a refill on your cardiac medications before your next appointment, please call your pharmacy*  Testing/Procedures: Your physician has requested that you have an echocardiogram. Echocardiography is a painless test that uses sound waves to create images of your heart. It provides your doctor with information about the size and shape of your heart and how well your heart's chambers and valves are working. This procedure takes approximately one hour. There are no restrictions for this procedure.   Follow-Up: At [facility], you and your health needs are our priority.  As part of our continuing mission to provide you with exceptional heart care, we have created designated Provider Care Teams.  These Care Teams include your primary Cardiologist (physician) and Advanced Practice Providers (APPs -  Physician Assistants and Nurse Practitioners) who all work together to provide you with the care you need, when you need it.  We recommend signing up for the patient portal called "MyChart".  Sign up information is provided on this After Visit Summary.  MyChart is used to connect with patients for Virtual Visits (Telemedicine).  Patients are able to view lab/test results, encounter notes, upcoming appointments, etc.  Non-urgent messages can be sent to your  provider as well.   To learn more about what you can do with MyChart, go to [email or web address].    Your next appointment:   3 month(s)  The format for your next appointment:   In Person  Provider:   Cherlynn Kaiser, MD

## 2019-12-30 NOTE — Patient Instructions (Signed)
Medication Instructions:  CONTINUE WITH CURRENT MEDICATIONS. NO CHANGES.  *If you need a refill on your cardiac medications before your next appointment, please call your pharmacy*  Testing/Procedures: Your physician has requested that you have an echocardiogram. Echocardiography is a painless test that uses sound waves to create images of your heart. It provides your doctor with information about the size and shape of your heart and how well your heart's chambers and valves are working. This procedure takes approximately one hour. There are no restrictions for this procedure.   Follow-Up: At [facility], you and your health needs are our priority.  As part of our continuing mission to provide you with exceptional heart care, we have created designated Provider Care Teams.  These Care Teams include your primary Cardiologist (physician) and Advanced Practice Providers (APPs -  Physician Assistants and Nurse Practitioners) who all work together to provide you with the care you need, when you need it.  We recommend signing up for the patient portal called "MyChart".  Sign up information is provided on this After Visit Summary.  MyChart is used to connect with patients for Virtual Visits (Telemedicine).  Patients are able to view lab/test results, encounter notes, upcoming appointments, etc.  Non-urgent messages can be sent to your provider as well.   To learn more about what you can do with MyChart, go to [email or web address].    Your next appointment:   3 month(s)  The format for your next appointment:   In Person  Provider:   Cherlynn Kaiser, MD

## 2019-12-30 NOTE — Progress Notes (Signed)
Patient Care Team: [name], MD as PCP - General (Family Medicine)  DIAGNOSIS:    ICD-10-CM   1. 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 BRCA reportedly normal.   08/25/2015 Relapse/Recurrence   new solitary liver lesion, biopsy showing metastatic ER PR + breast   09/25/2015 - 03/11/2016 Chemotherapy   Xeloda. Stopped due to progression of liver and bone metastases; pleural metastases   03/31/2016 - 07/19/2018 Anti-estrogen oral therapy   Foslodex started 03/31/16, currently monthly  Ibrance 167m 3 weeks on, 1 week off started 03/31/16 Xgeva started on 05/05/16 Letrozole daily added 08/01/17    03/20/2018 Genetic Testing   Genetic testing performed through [facility]'s Multi-Cancer reported out on 03/19/2018 showed no pathogenic mutations. The Multi-Cancer Panel offered by [facility] includes sequencing and/or deletion duplication testing of the following 91 genes: AIP, ALK, APC, ATM, AXIN2, BAP1, BARD1, BLM, BMPR1A, BRCA1, BRCA2, BRIP1, BUB1B, CASR, CDC73, CDH1, CDK4, CDKN1B, CDKN1C, CDKN2A, CEBPA, CEP57, CHEK2, CTNNA1, DICER1, DIS3L2, EGFR, ENG, EPCAM, FH, FLCN, GALNT12, GATA2, GPC3, GREM1, HOXB13, HRAS, KIT, MAX, MEN1, MET, MITF, MLH1, MLH3, MSH2, MSH3, MSH6, MUTYH, NBN, NF1, NF2, NTHL1, PALB2, PDGFRA, PHOX2B, PMS2, POLD1, POLE, POT1, PRKAR1A, PTCH1, PTEN, RAD50, RAD51C,  RAD51D, RB1, RECQL4, RET, RNF43, RPS20, RUNX1, SDHA, SDHAF2, SDHB, SDHC, SDHD, SMAD4, SMARCA4, SMARCB1, SMARCE1, STK11, SUFU, TERC, TERT, TMEM127, TP53, TSC1, TSC2, VHL, WRN, WT1  A variant of uncertain significance (VUS) in a gene called MET was also noted. c.[id number] (p.Thr557Ala)   04/08/2018 Miscellaneous   [facility] molecular testing: Androgen receptor positive, PDL 1 neg, MSI stable, NTRK1/2/3Neg, T MBB intermediate 9 mutations/Mb, ESR 1-, PI 3 CA negative, BRCA negative   07/20/2018 - 08/30/2018 Anti-estrogen oral therapy   Exemestane with everolimus.  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   Carcinoma of breast metastatic to bone ([place]  03/27/2016 Initial Diagnosis   Carcin[place] of breast metastatic to bone ([place]   08/03/2017 - 01/31/2018 Chemotherapy   Foslodex started 03/31/16 monthly  Ibrance 1249m3 weeks on, 1 week off started 03/31/16 Daily letrozole added 08/01/17    12/09/2019 -  Chemotherapy   The patient had palonosetron (ALOXI) injection 0.25 mg, 0.25 mg, Intravenous,  Once, 1 of 8 cycles Administration: 0.25 mg (12/09/2019) methotrexate (PF) chemo injection 72.5 mg, 40 mg/m2 = 72.5 mg, Intravenous,  Once, 1 of 8 cycles Administration: 72.5 mg (12/09/2019) cyclophosphamide (CYTOXAN) 1,080 mg in sodium chloride 0.9 % 250 mL chemo infusion, 600 mg/m2 = 1,080 mg, Intravenous,  Once, 1 of 8 cycles Administration: 1,080 mg (12/09/2019) fluorouracil (ADRUCIL) chemo injection 1,100 mg, 600 mg/m2 = 1,100 mg, Intravenous,  Once, 1 of 8 cycles Administration: 1,100 mg (12/09/2019)  for  chemotherapy treatment.    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 ([place])   12/09/2019 -  Chemotherapy   The patient had palonosetron (ALOXI) injection 0.25 mg, 0.25 mg, Intravenous,  Once, 1 of 8 cycles Administration: 0.25 mg (12/09/2019) methotrexate (PF) chemo injection 72.5 mg, 40 mg/m2 = 72.5 mg, Intravenous,  Once, 1 of 8 cycles Administration: 72.5 mg (12/09/2019) cyclophosphamide (CYTOXAN) 1,080 mg in sodium chloride 0.9 % 250 mL chemo infusion, 600 mg/m2 = 1,080 mg, Intravenous,  Once, 1 of 8 cycles Administration: 1,080 mg (12/09/2019) fluorouracil (ADRUCIL) chemo injection 1,100 mg, 600 mg/m2 = 1,100 mg, Intravenous,  Once, 1 of 8 cycles Administration: 1,100 mg (12/09/2019)  for chemotherapy treatment.      CHIEF COMPLIANT: Follow-up of metastatic breast cancer, cycle 2 CMF  INTERVAL HISTORY: Brittany Porter is a 74 y.o. with above-mentioned history of metastatic breast cancerwith metastases to thebone,liver,and lungswhois currently on treatment withCMF and Zometa.She presentsto the clinic todayfor cycle 2. She went to the emergency room thinking that she developed anemia once again and cancer that she had recent hemoglobin and did not require blood transfusion.  We will perform a CT chest and ruled out PE.  ALLERGIES:  is allergic to prednisolone, other, and sulfa antibiotics.  MEDICATIONS:  Current Outpatient Medications  Medication Sig Dispense Refill  . ACCU-CHEK AVIVA PLUS test strip     . Accu-Chek FastClix Lancets MISC     . acetaminophen (TYLENOL) 500 MG tablet Take 1,000 mg by mouth 2 (two) times daily as needed for mild pain or headache.    Marland Kitchen amiodarone (PACERONE) 200 MG tablet Take 1 tablet (200 mg total) by mouth daily. 90 tablet 3  . atorvastatin (LIPITOR) 20 MG tablet Take 1 tablet (20 mg total) by mouth daily at 6 PM. 45 tablet 9  . Calcium Carb-Cholecalciferol (CALCIUM  600+D3) 600-800 MG-UNIT TABS Take 1 tablet by mouth 2 (two) times daily.     . Cholecalciferol (VITAMIN D3) 2000 UNITS TABS Take 2,000 mg by mouth daily.    . fish oil-omega-3 fatty acids 1000 MG capsule Take 1 g by mouth 2 (two) times daily.     . furosemide (LASIX) 20 MG tablet Take 1 tablet (20 mg total) by mouth daily. Reported on 05/07/2015 (Patient taking differently: Take 20 mg by mouth daily as needed for fluid. Reported on 05/07/2015) 30 tablet 0  . gabapentin (NEURONTIN) 100 MG capsule Take 100 mg by mouth 2 (two) times daily.    Marland Kitchen ibuprofen (ADVIL) 200 MG tablet Take 400 mg by mouth every 6 (six) hours as needed for headache or mild pain.    . Ipratropium-Albuterol (COMBIVENT RESPIMAT) 20-100 MCG/ACT AERS respimat Inhale 1 puff into the lungs every 6 (six) hours. 4 g 6  . lidocaine-prilocaine (EMLA) cream Apply to affected area once (Patient taking differently: Apply 1 application topically as needed (port access). ) 30 g 3  . Magnesium Oxide (MAG-OXIDE) 200 MG TABS Take 1 tablet (200 mg total) by mouth daily. (Patient taking differently: Take 200 mg by mouth at bedtime. ) 30 tablet 0  . metFORMIN (GLUCOPHAGE) 500 MG tablet Take 2 tablets (1,000 mg total) by mouth 2 (two) times daily with a meal. 120 tablet 0  . metoprolol succinate (TOPROL-XL) 25  MG 24 hr tablet Take 1 tablet (25 mg total) by mouth daily. 90 tablet 3  . ondansetron (ZOFRAN) 8 MG tablet Take 1 tablet (8 mg total) by mouth 2 (two) times daily as needed for refractory nausea / vomiting. Start on day 3 after chemotherapy. 30 tablet 1  . pantoprazole (PROTONIX) 40 MG tablet Take 1 tablet (40 mg total) by mouth 2 (two) times daily before a meal. 60 tablet 6  . prochlorperazine (COMPAZINE) 10 MG tablet Take 1 tablet (10 mg total) by mouth every 6 (six) hours as needed (Nausea or vomiting). 30 tablet 1   No current facility-administered medications for this visit.    PHYSICAL EXAMINATION: ECOG PERFORMANCE STATUS: 1 -  Symptomatic but completely ambulatory  Vitals:   12/31/19 0902  BP: 105/70  Pulse: 71  Resp: 17  Temp: (!) 97.5 F (36.4 C)  SpO2: 100%   Filed Weights   12/31/19 0902  Weight: 156 lb 14.4 oz (71.2 kg)     LABORATORY DATA:  I have reviewed the data as listed CMP Latest Ref Rng & Units 12/24/2019 12/09/2019 12/02/2019  Glucose 70 - 99 mg/dL 175(H) 139(H) 128(H)  BUN 8 - 23 mg/dL 36(H) 29(H) 26(H)  Creatinine 0.44 - 1.00 mg/dL 1.53(H) 1.19(H) 0.95  Sodium 135 - 145 mmol/L 136 141 141  Potassium 3.5 - 5.1 mmol/L 5.4(H) 4.5 4.5  Chloride 98 - 111 mmol/L 101 106 106  CO2 22 - 32 mmol/L '22 24 27  ' Calcium 8.9 - 10.3 mg/dL 6.8(L) 8.2(L) 8.0(L)  Total Protein 6.5 - 8.1 g/dL 6.4(L) 6.3(L) 6.3(L)  Total Bilirubin 0.3 - 1.2 mg/dL 1.4(H) 0.6 0.6  Alkaline Phos 38 - 126 U/L 41 50 52  AST 15 - 41 U/L '26 21 22  ' ALT 0 - 44 U/L '9 7 6    ' Lab Results  Component Value Date   WBC 10.9 (H) 12/31/2019   HGB 8.9 (L) 12/31/2019   HCT 28.5 (L) 12/31/2019   MCV 89.3 12/31/2019   PLT 195 12/31/2019   NEUTROABS PENDING 12/31/2019    ASSESSMENT & PLAN:  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) Metastatic breast cancer: recently progressive in liver and bone, also pleura.  Faslodex + Ibrance beginning 03/31/2016-March 2020 stopped due to progression Exemestane with everolimus discontinued 08/29/2018 due to hyperglycemia Xeloda discontinued 04/08/2019 Enzalutamide started 04/15/2019 discontinued 07/08/2019 for progression Tamoxifen with Faslodex started 3/16/2021discontinued 12/04/19 for progression  [facility] molecular testing: Androgen receptor positive, PDL 1 neg, MSI stable, NTRK1/2/3Neg, T MBB intermediate 9 mutations/Mb, ESR 1-, PI 3 CA negative, BRCA negative  Hospitalization for severe anemia hemoglobin of 2.6 received 4 units of PRBC, today's hemoglobin is CT abdomen pelvis7/19/2021: Progressive pleuroparenchymal, hepatic, peritoneal and osseous metastatic disease. Trace  ascites. ------------------------------------------------------------------------------------------------------------------------------------------ Treatmentplan: CMF cycle 2 day 1 Goal of treatment: Palliation  She has been to the beach with her family and enjoyed a week  Chemo toxicities: 1.  Chemo induced anemia: Monitoring closely.  I suspect that she might need blood transfusion in 3 weeks when she comes back.  We will add blood transfusion to her chemo appointment in 3 weeks from now. 2. very occasional diarrhea   Return to clinic in 3 weeks for cycle 3    Orders Placed This Encounter  Procedures  . Sample to Blood Bank    Standing Status:   Future    Standing Expiration Date:   12/30/2020   The patient has a good understanding of the overall plan. she agrees with  it. she will call with any problems that may develop before the next visit here.  Total time spent: 30 mins including face to face time and time spent for planning, charting and coordination of care  Nicholas Lose, MD 12/31/2019  I, Cloyde Reams Dorshimer, am acting as scribe for Dr. Nicholas Lose.  I have reviewed the above documentation for accuracy and completeness, and I agree with the above.

## 2019-12-31 ENCOUNTER — Inpatient Hospital Stay (HOSPITAL_BASED_OUTPATIENT_CLINIC_OR_DEPARTMENT_OTHER): Payer: Medicare Other | Admitting: Hematology and Oncology

## 2019-12-31 ENCOUNTER — Inpatient Hospital Stay: Payer: Medicare Other | Attending: Hematology and Oncology

## 2019-12-31 ENCOUNTER — Other Ambulatory Visit: Payer: Self-pay

## 2019-12-31 ENCOUNTER — Inpatient Hospital Stay: Payer: Medicare Other

## 2019-12-31 DIAGNOSIS — C786 Secondary malignant neoplasm of retroperitoneum and peritoneum: Secondary | ICD-10-CM | POA: Insufficient documentation

## 2019-12-31 DIAGNOSIS — C7951 Secondary malignant neoplasm of bone: Secondary | ICD-10-CM | POA: Diagnosis present

## 2019-12-31 DIAGNOSIS — Z7984 Long term (current) use of oral hypoglycemic drugs: Secondary | ICD-10-CM | POA: Diagnosis not present

## 2019-12-31 DIAGNOSIS — Z7189 Other specified counseling: Secondary | ICD-10-CM

## 2019-12-31 DIAGNOSIS — C782 Secondary malignant neoplasm of pleura: Secondary | ICD-10-CM | POA: Diagnosis not present

## 2019-12-31 DIAGNOSIS — Z170 Estrogen receptor positive status [ER+]: Secondary | ICD-10-CM | POA: Insufficient documentation

## 2019-12-31 DIAGNOSIS — C50911 Malignant neoplasm of unspecified site of right female breast: Secondary | ICD-10-CM | POA: Diagnosis not present

## 2019-12-31 DIAGNOSIS — Z5111 Encounter for antineoplastic chemotherapy: Secondary | ICD-10-CM | POA: Insufficient documentation

## 2019-12-31 DIAGNOSIS — C50919 Malignant neoplasm of unspecified site of unspecified female breast: Secondary | ICD-10-CM

## 2019-12-31 DIAGNOSIS — D649 Anemia, unspecified: Secondary | ICD-10-CM | POA: Diagnosis not present

## 2019-12-31 DIAGNOSIS — C787 Secondary malignant neoplasm of liver and intrahepatic bile duct: Secondary | ICD-10-CM

## 2019-12-31 DIAGNOSIS — Z79899 Other long term (current) drug therapy: Secondary | ICD-10-CM | POA: Insufficient documentation

## 2019-12-31 DIAGNOSIS — Z95828 Presence of other vascular implants and grafts: Secondary | ICD-10-CM

## 2019-12-31 DIAGNOSIS — Z9013 Acquired absence of bilateral breasts and nipples: Secondary | ICD-10-CM | POA: Diagnosis not present

## 2019-12-31 DIAGNOSIS — Z9221 Personal history of antineoplastic chemotherapy: Secondary | ICD-10-CM | POA: Diagnosis not present

## 2019-12-31 LAB — CMP (CANCER CENTER ONLY)
ALT: <6 U/L (ref 0–44)
AST: 19 U/L (ref 15–41)
Albumin: 2 g/dL — ABNORMAL LOW (ref 3.5–5.0)
Alkaline Phosphatase: 62 U/L (ref 38–126)
Anion gap: 12 (ref 5–15)
BUN: 25 mg/dL — ABNORMAL HIGH (ref 8–23)
CO2: 21 mmol/L — ABNORMAL LOW (ref 22–32)
Calcium: 7.7 mg/dL — ABNORMAL LOW (ref 8.9–10.3)
Chloride: 105 mmol/L (ref 98–111)
Creatinine: 1.11 mg/dL — ABNORMAL HIGH (ref 0.44–1.00)
GFR, Est AFR Am: 56 mL/min — ABNORMAL LOW (ref >60)
GFR, Estimated: 48 mL/min — ABNORMAL LOW (ref >60)
Glucose, Bld: 129 mg/dL — ABNORMAL HIGH (ref 70–99)
Potassium: 5 mmol/L (ref 3.5–5.1)
Sodium: 138 mmol/L (ref 135–145)
Total Bilirubin: 0.5 mg/dL (ref 0.3–1.2)
Total Protein: 5.9 g/dL — ABNORMAL LOW (ref 6.5–8.1)

## 2019-12-31 LAB — CBC WITH DIFFERENTIAL (CANCER CENTER ONLY)
Abs Immature Granulocytes: 1.21 10*3/uL — ABNORMAL HIGH (ref 0.00–0.07)
Basophils Absolute: 0 10*3/uL (ref 0.0–0.1)
Basophils Relative: 0 %
Eosinophils Absolute: 0 10*3/uL (ref 0.0–0.5)
Eosinophils Relative: 0 %
HCT: 28.5 % — ABNORMAL LOW (ref 36.0–46.0)
Hemoglobin: 8.9 g/dL — ABNORMAL LOW (ref 12.0–15.0)
Immature Granulocytes: 11 %
Lymphocytes Relative: 9 %
Lymphs Abs: 1 10*3/uL (ref 0.7–4.0)
MCH: 27.9 pg (ref 26.0–34.0)
MCHC: 31.2 g/dL (ref 30.0–36.0)
MCV: 89.3 fL (ref 80.0–100.0)
Monocytes Absolute: 0.9 10*3/uL (ref 0.1–1.0)
Monocytes Relative: 8 %
Neutro Abs: 7.7 10*3/uL (ref 1.7–7.7)
Neutrophils Relative %: 72 %
Platelet Count: 195 10*3/uL (ref 150–400)
RBC: 3.19 MIL/uL — ABNORMAL LOW (ref 3.87–5.11)
RDW: 17.3 % — ABNORMAL HIGH (ref 11.5–15.5)
WBC Count: 10.9 10*3/uL — ABNORMAL HIGH (ref 4.0–10.5)
nRBC: 0.2 % (ref 0.0–0.2)

## 2019-12-31 MED ORDER — METHOTREXATE SODIUM (PF) CHEMO INJECTION 250 MG/10ML
40.0000 mg/m2 | Freq: Once | INTRAMUSCULAR | Status: AC
  Administered 2019-12-31: 72.5 mg via INTRAVENOUS
  Filled 2019-12-31: qty 2.9

## 2019-12-31 MED ORDER — PALONOSETRON HCL INJECTION 0.25 MG/5ML
0.2500 mg | Freq: Once | INTRAVENOUS | Status: AC
  Administered 2019-12-31: 0.25 mg via INTRAVENOUS

## 2019-12-31 MED ORDER — SODIUM CHLORIDE 0.9% FLUSH
10.0000 mL | Freq: Once | INTRAVENOUS | Status: AC | PRN
  Administered 2019-12-31: 10 mL
  Filled 2019-12-31: qty 10

## 2019-12-31 MED ORDER — SODIUM CHLORIDE 0.9% FLUSH
10.0000 mL | INTRAVENOUS | Status: DC | PRN
  Administered 2019-12-31: 10 mL
  Filled 2019-12-31: qty 10

## 2019-12-31 MED ORDER — HEPARIN SOD (PORK) LOCK FLUSH 100 UNIT/ML IV SOLN
500.0000 [IU] | Freq: Once | INTRAVENOUS | Status: AC | PRN
  Administered 2019-12-31: 500 [IU]
  Filled 2019-12-31: qty 5

## 2019-12-31 MED ORDER — SODIUM CHLORIDE 0.9 % IV SOLN
10.0000 mg | Freq: Once | INTRAVENOUS | Status: AC
  Administered 2019-12-31: 10 mg via INTRAVENOUS
  Filled 2019-12-31: qty 10

## 2019-12-31 MED ORDER — PALONOSETRON HCL INJECTION 0.25 MG/5ML
INTRAVENOUS | Status: AC
  Filled 2019-12-31: qty 5

## 2019-12-31 MED ORDER — FLUOROURACIL CHEMO INJECTION 2.5 GM/50ML
600.0000 mg/m2 | Freq: Once | INTRAVENOUS | Status: AC
  Administered 2019-12-31: 1100 mg via INTRAVENOUS
  Filled 2019-12-31: qty 22

## 2019-12-31 MED ORDER — SODIUM CHLORIDE 0.9 % IV SOLN
600.0000 mg/m2 | Freq: Once | INTRAVENOUS | Status: AC
  Administered 2019-12-31: 1080 mg via INTRAVENOUS
  Filled 2019-12-31: qty 54

## 2019-12-31 MED ORDER — SODIUM CHLORIDE 0.9 % IV SOLN
Freq: Once | INTRAVENOUS | Status: AC
  Filled 2019-12-31: qty 250

## 2019-12-31 NOTE — Assessment & Plan Note (Signed)
Metastatic breast cancer: recently progressive in liver and bone, also pleura.  Faslodex + Ibrance beginning 03/31/2016-March 2020 stopped due to progression Exemestane with everolimus discontinued 08/29/2018 due to hyperglycemia Xeloda discontinued 04/08/2019 Enzalutamide started 04/15/2019 discontinued 07/08/2019 for progression Tamoxifen with Faslodex started 3/16/2021discontinued 12/04/19 for progression  [facility] molecular testing: Androgen receptor positive, PDL 1 neg, MSI stable, NTRK1/2/3Neg, T MBB intermediate 9 mutations/Mb, ESR 1-, PI 3 CA negative, BRCA negative  Hospitalization for severe anemia hemoglobin of 2.6 received 4 units of PRBC, today's hemoglobin is CT abdomen pelvis7/19/2021: Progressive pleuroparenchymal, hepatic, peritoneal and osseous metastatic disease. Trace ascites. ------------------------------------------------------------------------------------------------------------------------------------------ Treatmentplan: CMF cycle 2 day 1 Goal of treatment: Palliation  Chemo toxicities:  Return to clinic in 3 weeks for cycle 3

## 2019-12-31 NOTE — Patient Instructions (Addendum)
[facility] Discharge Instructions for Patients Receiving Chemotherapy  Today you received the following chemotherapy agents: cyclophosphamide, methotrexate, fluorouracil.  To help prevent nausea and vomiting after your treatment, we encourage you to take your nausea medication as prescribed by your physician.   If you develop nausea and vomiting that is not controlled by your nausea medication, call the clinic.   BELOW ARE SYMPTOMS THAT SHOULD BE REPORTED IMMEDIATELY:  *FEVER GREATER THAN 100.5 F  *CHILLS WITH OR WITHOUT FEVER  NAUSEA AND VOMITING THAT IS NOT CONTROLLED WITH YOUR NAUSEA MEDICATION  *UNUSUAL SHORTNESS OF BREATH  *UNUSUAL BRUISING OR BLEEDING  TENDERNESS IN MOUTH AND THROAT WITH OR WITHOUT PRESENCE OF ULCERS  *URINARY PROBLEMS  *BOWEL PROBLEMS  UNUSUAL RASH Items with * indicate a potential emergency and should be followed up as soon as possible.  Feel free to call the clinic should you have any questions or concerns. The clinic phone number is [phone].  Please show the [place] at check-in to the Emergency Department and triage nurse.

## 2020-01-01 ENCOUNTER — Ambulatory Visit: Payer: Medicare Other

## 2020-01-01 ENCOUNTER — Telehealth: Payer: Self-pay | Admitting: Hematology and Oncology

## 2020-01-01 NOTE — Telephone Encounter (Signed)
Scheduled per 8/17 los. Pt will receive and updated appt calendar at next appt, per appt notes

## 2020-01-06 ENCOUNTER — Other Ambulatory Visit: Payer: Self-pay | Admitting: *Deleted

## 2020-01-06 ENCOUNTER — Telehealth: Payer: Self-pay | Admitting: Hematology and Oncology

## 2020-01-06 DIAGNOSIS — C7951 Secondary malignant neoplasm of bone: Secondary | ICD-10-CM

## 2020-01-06 NOTE — Progress Notes (Signed)
Received call from pt daughter Santiago Glad with concerns regarding pt overall health.  States since pt last tx on 12/31/19 pt has become very weak, is incontinent of stool and bladder, and is having trouble talking. Pt daughter requesting to speak with MD regarding goals of care.  Per MD pt to be transitioned to hospice care.  RN placed order and called report to [facility].

## 2020-01-06 NOTE — Telephone Encounter (Signed)
Mrs. Brittany Porter is not doing well at home.  She is hardly communicating and the family is having to assist her with all activities of daily living.  Her skin is frail and she has gotten markedly weaker.  I recommended that we discontinue all further chemotherapy and proceed with hospice care. I spoke to her daughter who is in agreement.

## 2020-01-10 ENCOUNTER — Other Ambulatory Visit: Payer: Self-pay

## 2020-01-10 ENCOUNTER — Telehealth: Payer: Self-pay

## 2020-01-10 ENCOUNTER — Telehealth (HOSPITAL_COMMUNITY): Payer: Self-pay | Admitting: Internal Medicine

## 2020-01-10 MED ORDER — NYSTATIN 100000 UNIT/GM EX POWD: 1.0000 "application " | Freq: Every day | CUTANEOUS | 3 refills | Status: AC | PRN

## 2020-01-10 NOTE — Telephone Encounter (Signed)
Patient spouse called and is cancelling echocardiogram appt due to patient is now bedridden and under [facility]. Order will be removed from the WQ.

## 2020-01-10 NOTE — Telephone Encounter (Signed)
Thank you for letting me know. That is appropriate to cancel the echo. Let's cancel November follow up appointment at this time. It can be reordered by PCP or oncology as needed.

## 2020-01-10 NOTE — Telephone Encounter (Signed)
Spoke to husband . Aware that Nov appointment has been cancelled per Dr Margaretann Loveless orders

## 2020-01-10 NOTE — Telephone Encounter (Signed)
April, hospice RN called requesting nystatin powder for pt's groin/ wounds as she seems to have developed irritation in that area. Nystatin powder daily PRN has been sent to [facility] in [place]. April, RN notified.

## 2020-01-15 ENCOUNTER — Other Ambulatory Visit (HOSPITAL_COMMUNITY): Payer: Medicare Other

## 2020-01-15 DEATH — deceased

## 2020-01-21 ENCOUNTER — Other Ambulatory Visit: Payer: Medicare Other

## 2020-01-21 ENCOUNTER — Ambulatory Visit: Payer: Medicare Other | Admitting: Hematology and Oncology

## 2020-01-21 ENCOUNTER — Ambulatory Visit: Payer: Medicare Other

## 2020-03-16 IMAGING — DX DG CHEST 2V
2 series · 2 of 2 positions shown · non-contrast
Comparison: Radiographs 01/09/2018. Cardiac CT 04/04/2018. PET-CT
02/05/2018.

CLINICAL DATA: Shortness of breath 42.4 weeks. Worsening shortness
of breath after recent prednisone therapy.

EXAM:
CHEST - 2 VIEW

[chest lat]
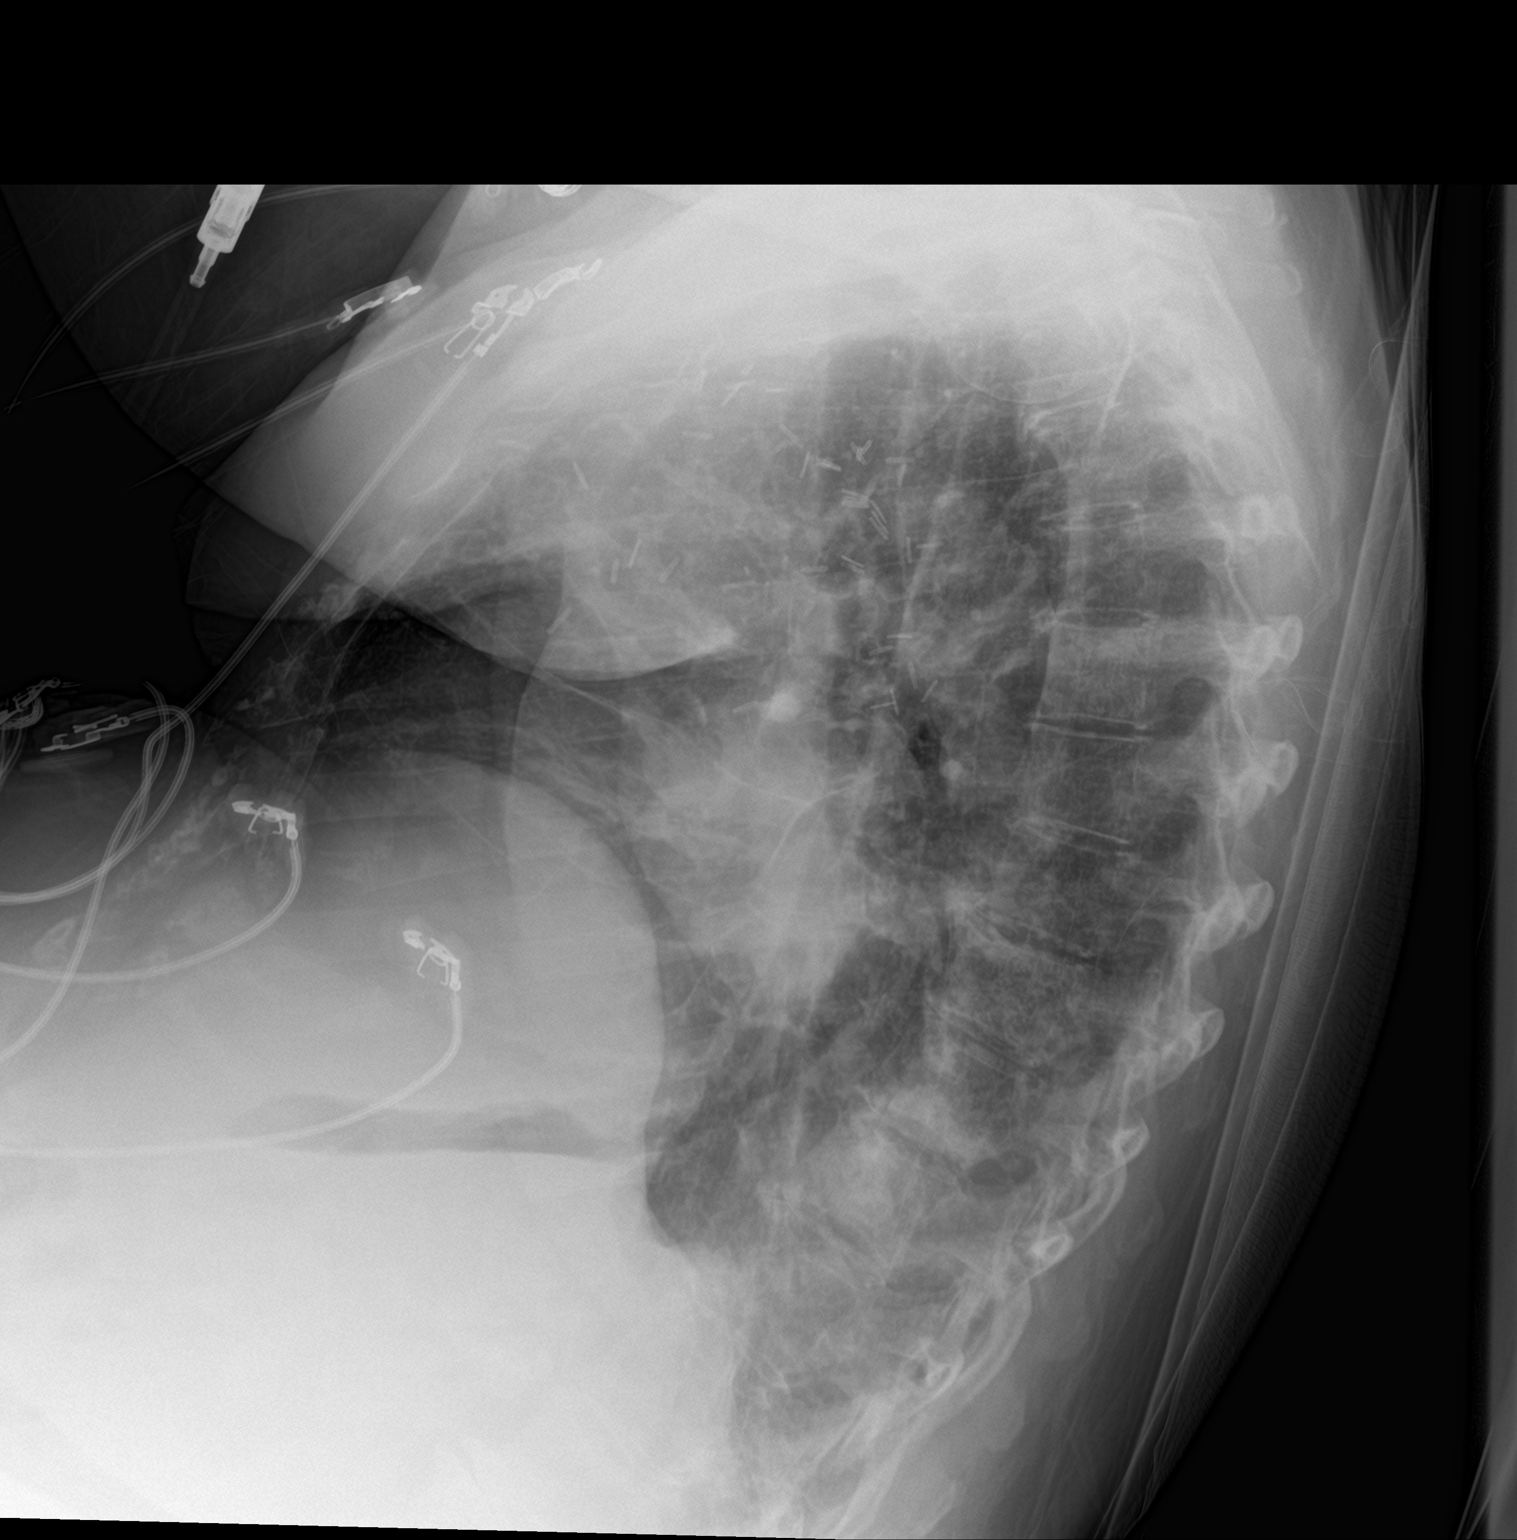

[chest ap]
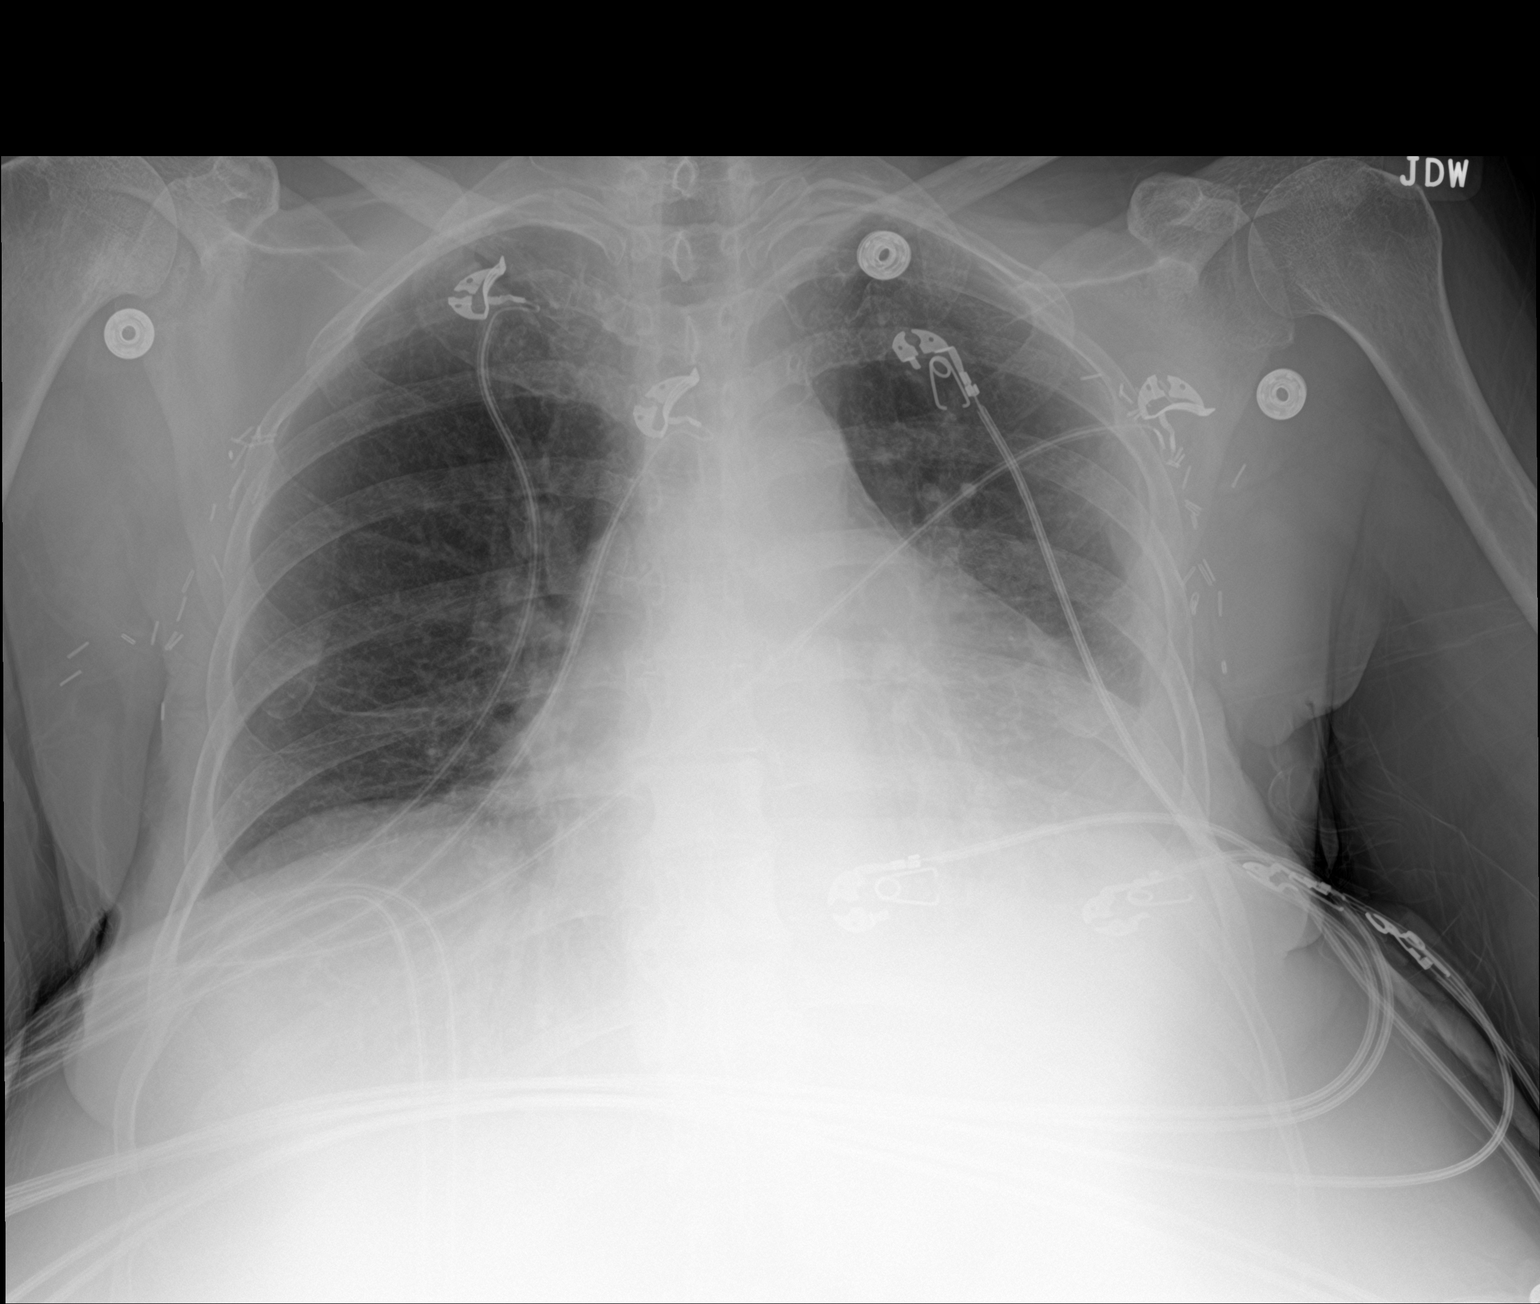

[2 of 2 positions shown; findings below may reference images not displayed]

FINDINGS: Stable cardiomegaly. There is chronic left pleural thickening and
left basilar opacity (hypermetabolic on prior PET-CT and consistent
with metastatic disease). The lungs are otherwise clear. There is no
edema, pleural effusion or pneumothorax. Postsurgical changes are
present in both breasts and axilla. No acute osseous findings are
evident. Multiple telemetry leads overlie the chest.
IMPRESSION: No acute findings or obvious progression in known thoracic
metastatic disease.

## 2020-04-01 ENCOUNTER — Ambulatory Visit: Payer: Medicare Other | Admitting: Internal Medicine

## 2020-10-07 IMAGING — DX PORTABLE CHEST - 1 VIEW
1 series · 1 of 1 positions shown · non-contrast
Comparison: 11/27/2018.

CLINICAL DATA: Nausea, vomiting and intermittent shortness of
breath.

EXAM:
PORTABLE CHEST 1 VIEW

[chest ap]
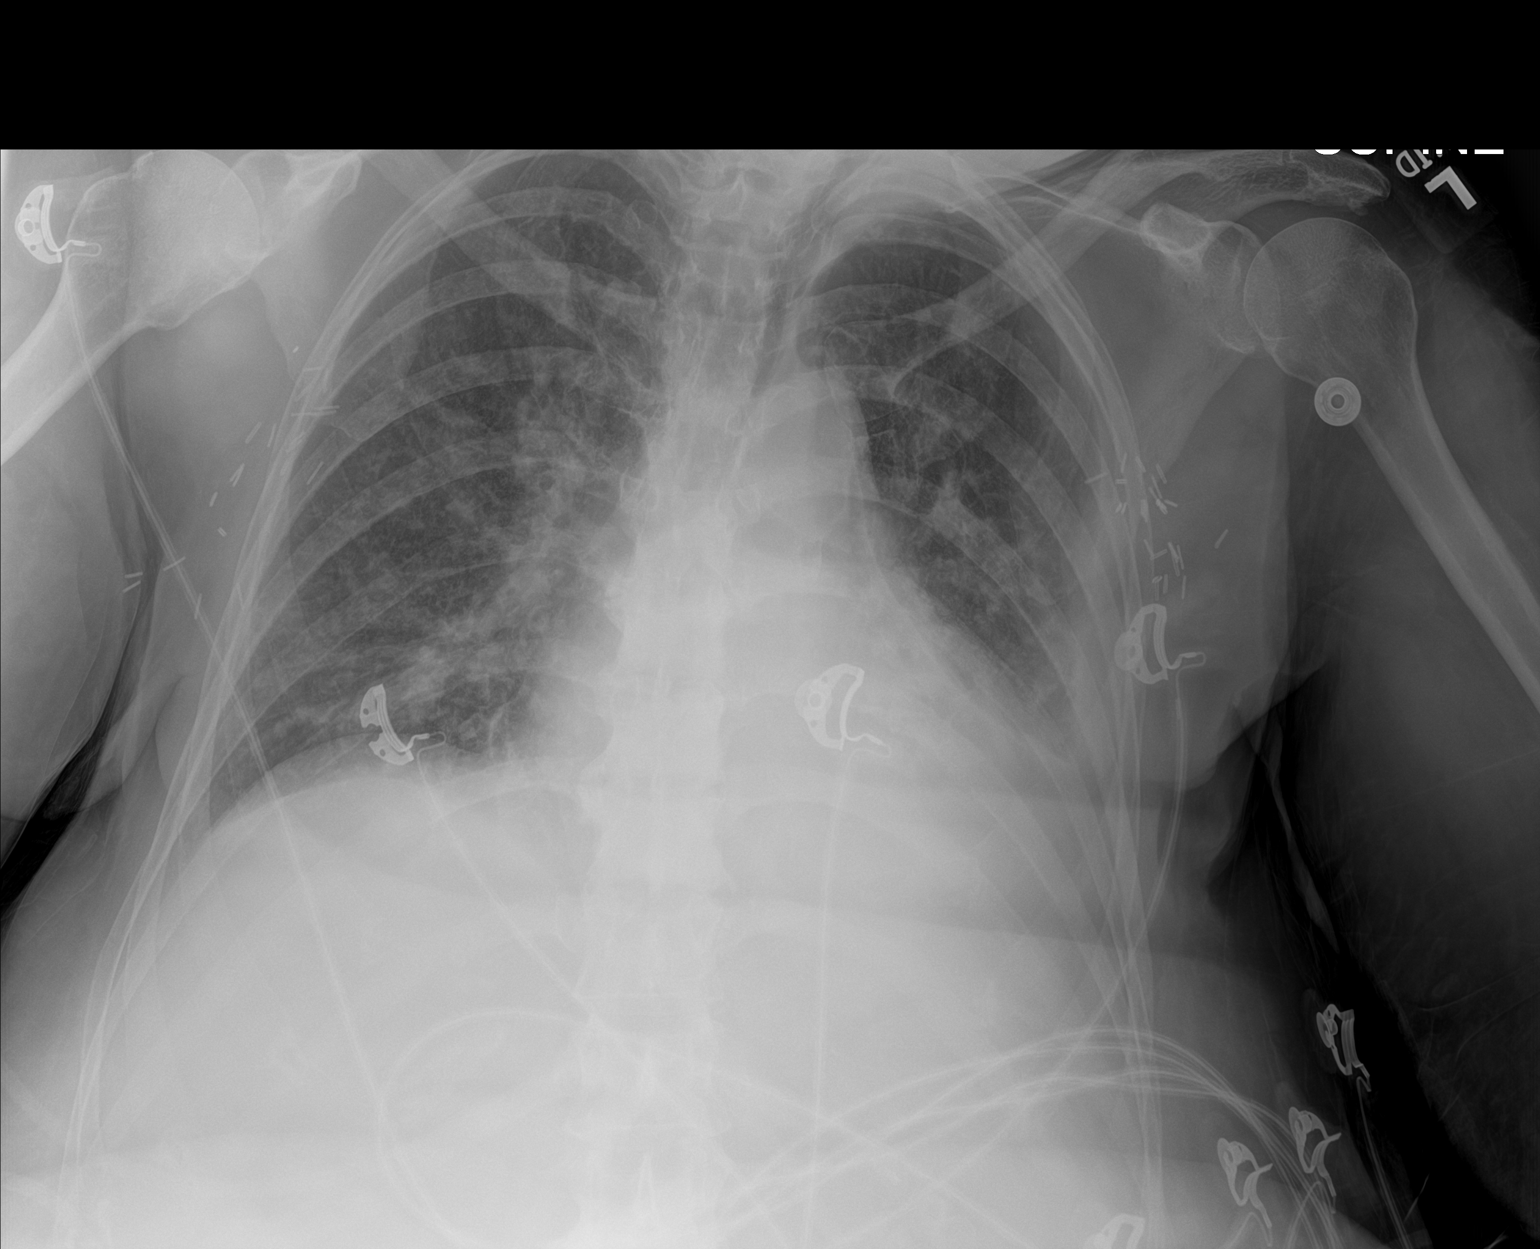

[1 of 1 positions shown; findings below may reference images not displayed]

FINDINGS: The cardiac silhouette remains mildly enlarged. Small to
moderate-sized left pleural effusion, increased. Increased left
basilar airspace opacity. Minimal right basilar atelectasis with
improvement. Stable diffuse peribronchial thickening and
accentuation of the interstitial markings. Bilateral postmastectomy
changes with bilateral axillary surgical clips.
IMPRESSION: 1. Small to moderate-sized left pleural effusion, increased.
2. Increased left basilar atelectasis or pneumonia.
3. Minimal right basilar atelectasis with improvement.
4. Stable mild cardiomegaly and chronic bronchitic changes.

## 2020-10-08 IMAGING — DX PORTABLE CHEST - 1 VIEW
1 series · 1 of 1 positions shown · non-contrast
Comparison: 12/28/2018

CLINICAL DATA: Acute respiratory distress. Recent transfusion.

EXAM:
PORTABLE CHEST 1 VIEW

[chest]
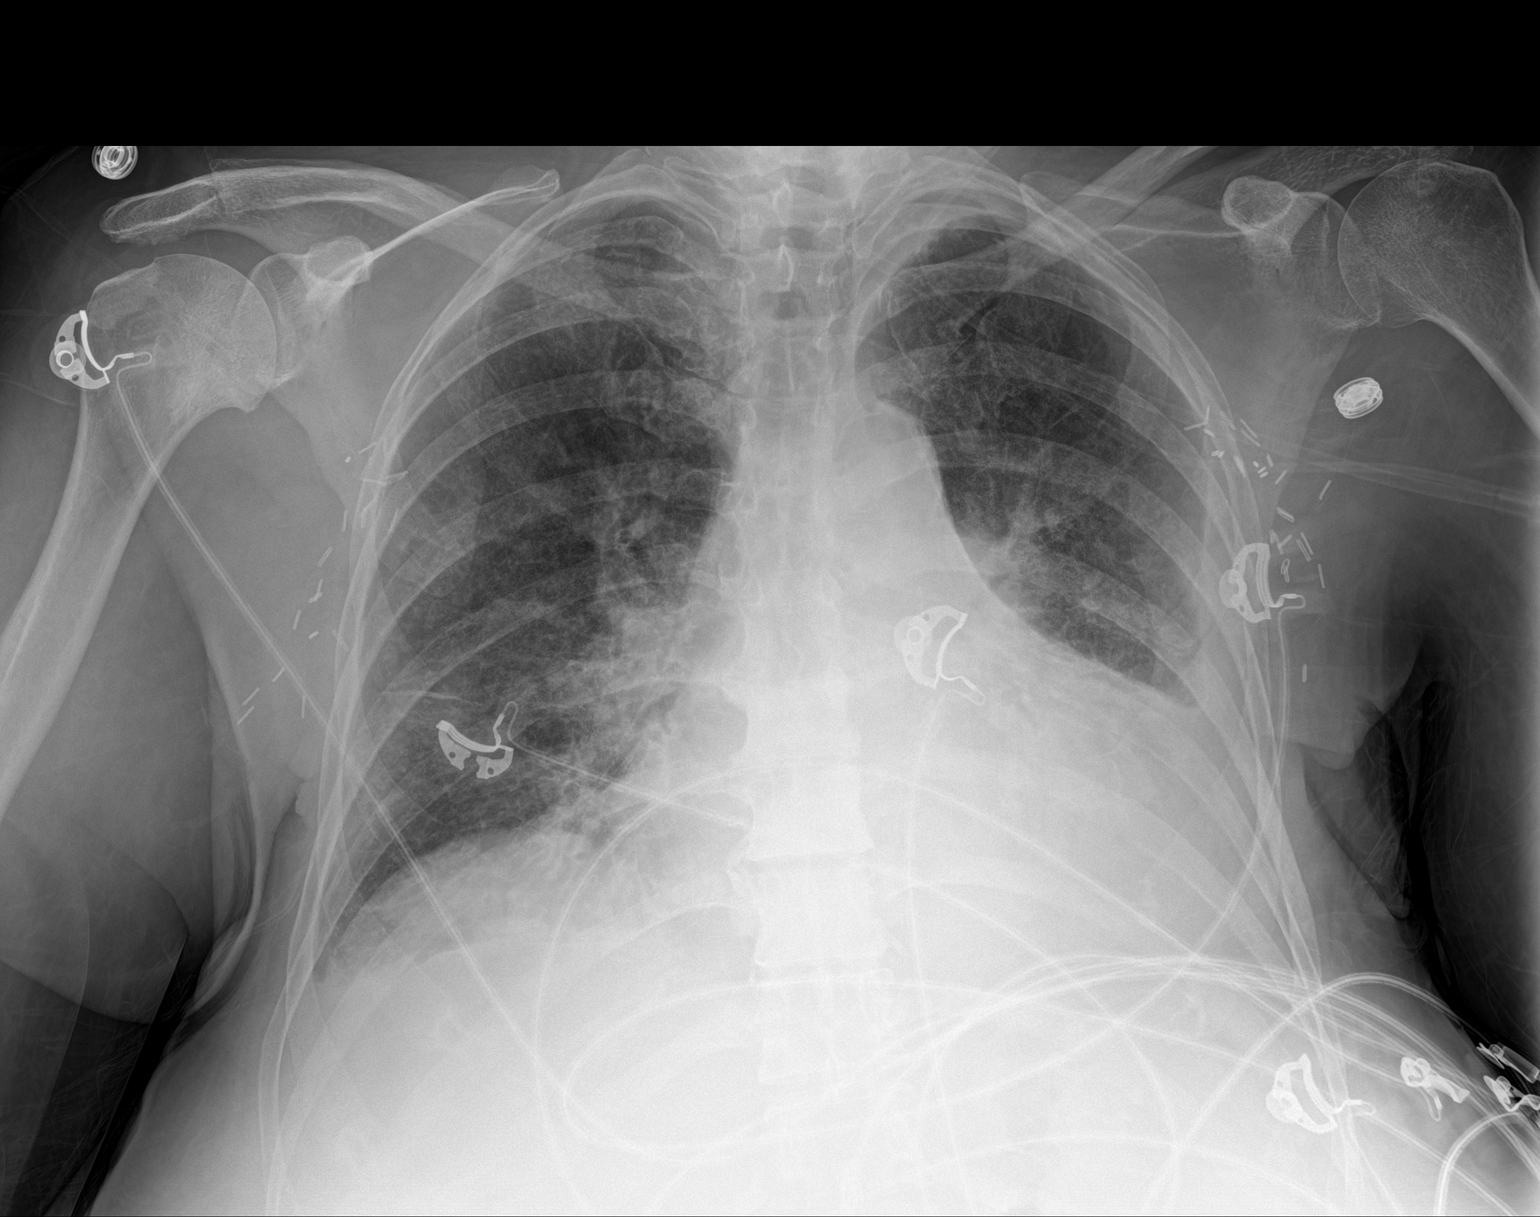

[1 of 1 positions shown; findings below may reference images not displayed]

FINDINGS: Cardiomegaly remains stable. Small to moderate left pleural effusion
and left retrocardiac atelectasis or infiltrate show no significant
change. Probable tiny right pleural effusion. Surgical clips again
seen in both axillary regions.
IMPRESSION: 1. No significant change in small to moderate left pleural effusion
and left retrocardiac atelectasis versus infiltrate.
2. Stable cardiomegaly. Probable tiny right pleural effusion.

## 2023-04-05 NOTE — Telephone Encounter (Signed)
Telephone call
